# Patient Record
Sex: Male | Born: 1960 | Smoking: Never smoker
Health system: Southern US, Community
[De-identification: ages and names within clinical notes are randomized; demographics above are authoritative.]

## PROBLEM LIST (undated history)

## (undated) DIAGNOSIS — E785 Hyperlipidemia, unspecified: Secondary | ICD-10-CM

## (undated) DIAGNOSIS — R7303 Prediabetes: Secondary | ICD-10-CM

## (undated) DIAGNOSIS — K219 Gastro-esophageal reflux disease without esophagitis: Secondary | ICD-10-CM

## (undated) DIAGNOSIS — I10 Essential (primary) hypertension: Secondary | ICD-10-CM

## (undated) DIAGNOSIS — L409 Psoriasis, unspecified: Secondary | ICD-10-CM

## (undated) DIAGNOSIS — M545 Low back pain, unspecified: Secondary | ICD-10-CM

## (undated) DIAGNOSIS — G4730 Sleep apnea, unspecified: Secondary | ICD-10-CM

## (undated) DIAGNOSIS — L309 Dermatitis, unspecified: Secondary | ICD-10-CM

## (undated) DIAGNOSIS — F329 Major depressive disorder, single episode, unspecified: Secondary | ICD-10-CM

## (undated) DIAGNOSIS — M1990 Unspecified osteoarthritis, unspecified site: Secondary | ICD-10-CM

## (undated) DIAGNOSIS — K635 Polyp of colon: Secondary | ICD-10-CM

## (undated) DIAGNOSIS — C61 Malignant neoplasm of prostate: Secondary | ICD-10-CM

## (undated) DIAGNOSIS — G44009 Cluster headache syndrome, unspecified, not intractable: Secondary | ICD-10-CM

## (undated) DIAGNOSIS — F32A Depression, unspecified: Secondary | ICD-10-CM

## (undated) DIAGNOSIS — T7840XA Allergy, unspecified, initial encounter: Secondary | ICD-10-CM

## (undated) DIAGNOSIS — A159 Respiratory tuberculosis unspecified: Secondary | ICD-10-CM

## (undated) DIAGNOSIS — Z87442 Personal history of urinary calculi: Secondary | ICD-10-CM

## (undated) HISTORY — DX: Unspecified osteoarthritis, unspecified site: M19.90

## (undated) HISTORY — DX: Dermatitis, unspecified: L30.9

## (undated) HISTORY — DX: Allergy, unspecified, initial encounter: T78.40XA

## (undated) HISTORY — DX: Malignant neoplasm of prostate: C61

## (undated) HISTORY — DX: Essential (primary) hypertension: I10

## (undated) HISTORY — DX: Hyperlipidemia, unspecified: E78.5

## (undated) HISTORY — DX: Cluster headache syndrome, unspecified, not intractable: G44.009

## (undated) HISTORY — DX: Sleep apnea, unspecified: G47.30

## (undated) HISTORY — DX: Prediabetes: R73.03

## (undated) HISTORY — DX: Polyp of colon: K63.5

## (undated) HISTORY — PX: APPENDECTOMY: SHX54

## (undated) HISTORY — DX: Psoriasis, unspecified: L40.9

## (undated) HISTORY — DX: Personal history of urinary calculi: Z87.442

## (undated) HISTORY — DX: Respiratory tuberculosis unspecified: A15.9

## (undated) HISTORY — PX: POLYPECTOMY: SHX149

---

## 2003-01-29 ENCOUNTER — Ambulatory Visit (HOSPITAL_COMMUNITY): Admission: RE | Admit: 2003-01-29 | Discharge: 2003-01-29 | Payer: Self-pay | Admitting: Gastroenterology

## 2003-01-29 ENCOUNTER — Encounter (INDEPENDENT_AMBULATORY_CARE_PROVIDER_SITE_OTHER): Payer: Self-pay | Admitting: Specialist

## 2005-11-01 ENCOUNTER — Ambulatory Visit: Payer: Self-pay | Admitting: Cardiology

## 2006-01-03 ENCOUNTER — Ambulatory Visit: Payer: Self-pay | Admitting: Internal Medicine

## 2006-01-11 ENCOUNTER — Encounter: Payer: Self-pay | Admitting: Internal Medicine

## 2006-01-11 ENCOUNTER — Ambulatory Visit (HOSPITAL_BASED_OUTPATIENT_CLINIC_OR_DEPARTMENT_OTHER): Admission: RE | Admit: 2006-01-11 | Discharge: 2006-01-11 | Payer: Self-pay | Admitting: Internal Medicine

## 2006-01-11 ENCOUNTER — Encounter: Payer: Self-pay | Admitting: Cardiology

## 2006-01-15 ENCOUNTER — Ambulatory Visit: Payer: Self-pay | Admitting: Internal Medicine

## 2006-01-26 ENCOUNTER — Ambulatory Visit: Payer: Self-pay | Admitting: Internal Medicine

## 2006-02-23 ENCOUNTER — Ambulatory Visit: Payer: Self-pay | Admitting: Internal Medicine

## 2006-04-06 ENCOUNTER — Ambulatory Visit: Payer: Self-pay | Admitting: Internal Medicine

## 2006-08-16 ENCOUNTER — Ambulatory Visit: Payer: Self-pay | Admitting: Gastroenterology

## 2006-08-29 ENCOUNTER — Ambulatory Visit: Payer: Self-pay | Admitting: Gastroenterology

## 2006-08-29 ENCOUNTER — Encounter (INDEPENDENT_AMBULATORY_CARE_PROVIDER_SITE_OTHER): Payer: Self-pay | Admitting: Specialist

## 2009-06-01 ENCOUNTER — Encounter: Payer: Self-pay | Admitting: Internal Medicine

## 2009-06-01 ENCOUNTER — Encounter: Payer: Self-pay | Admitting: Cardiology

## 2010-01-22 ENCOUNTER — Encounter: Payer: Self-pay | Admitting: Internal Medicine

## 2010-01-25 ENCOUNTER — Encounter: Payer: Self-pay | Admitting: Internal Medicine

## 2010-01-29 ENCOUNTER — Encounter: Payer: Self-pay | Admitting: Internal Medicine

## 2010-01-29 ENCOUNTER — Encounter: Payer: Self-pay | Admitting: Cardiology

## 2010-02-02 ENCOUNTER — Encounter: Payer: Self-pay | Admitting: Internal Medicine

## 2010-02-02 ENCOUNTER — Encounter: Payer: Self-pay | Admitting: Cardiology

## 2010-02-04 ENCOUNTER — Ambulatory Visit: Payer: Self-pay | Admitting: Internal Medicine

## 2010-02-04 DIAGNOSIS — G4733 Obstructive sleep apnea (adult) (pediatric): Secondary | ICD-10-CM | POA: Insufficient documentation

## 2010-02-04 DIAGNOSIS — Z8611 Personal history of tuberculosis: Secondary | ICD-10-CM | POA: Insufficient documentation

## 2010-02-10 ENCOUNTER — Encounter: Payer: Self-pay | Admitting: Cardiology

## 2010-02-10 DIAGNOSIS — J3089 Other allergic rhinitis: Secondary | ICD-10-CM | POA: Insufficient documentation

## 2010-02-10 DIAGNOSIS — I10 Essential (primary) hypertension: Secondary | ICD-10-CM | POA: Insufficient documentation

## 2010-02-10 DIAGNOSIS — E785 Hyperlipidemia, unspecified: Secondary | ICD-10-CM | POA: Insufficient documentation

## 2010-02-10 DIAGNOSIS — J45909 Unspecified asthma, uncomplicated: Secondary | ICD-10-CM | POA: Insufficient documentation

## 2010-02-10 DIAGNOSIS — E1159 Type 2 diabetes mellitus with other circulatory complications: Secondary | ICD-10-CM | POA: Insufficient documentation

## 2010-02-10 DIAGNOSIS — J40 Bronchitis, not specified as acute or chronic: Secondary | ICD-10-CM | POA: Insufficient documentation

## 2010-02-10 DIAGNOSIS — J309 Allergic rhinitis, unspecified: Secondary | ICD-10-CM

## 2010-03-04 ENCOUNTER — Ambulatory Visit: Payer: Self-pay | Admitting: Cardiology

## 2010-03-04 DIAGNOSIS — I2789 Other specified pulmonary heart diseases: Secondary | ICD-10-CM | POA: Insufficient documentation

## 2010-03-10 ENCOUNTER — Encounter: Payer: Self-pay | Admitting: Cardiology

## 2010-03-10 ENCOUNTER — Ambulatory Visit: Payer: Self-pay | Admitting: Cardiology

## 2010-03-10 ENCOUNTER — Ambulatory Visit (HOSPITAL_COMMUNITY): Admission: RE | Admit: 2010-03-10 | Discharge: 2010-03-10 | Payer: Self-pay | Admitting: Cardiology

## 2010-03-10 ENCOUNTER — Ambulatory Visit: Payer: Self-pay

## 2010-03-16 ENCOUNTER — Ambulatory Visit: Payer: Self-pay | Admitting: Cardiology

## 2010-03-16 DIAGNOSIS — R0989 Other specified symptoms and signs involving the circulatory and respiratory systems: Secondary | ICD-10-CM

## 2010-03-16 DIAGNOSIS — R0609 Other forms of dyspnea: Secondary | ICD-10-CM | POA: Insufficient documentation

## 2010-04-01 ENCOUNTER — Ambulatory Visit: Payer: Self-pay | Admitting: Internal Medicine

## 2010-05-31 ENCOUNTER — Telehealth: Payer: Self-pay | Admitting: Internal Medicine

## 2010-06-10 ENCOUNTER — Ambulatory Visit: Payer: Self-pay | Admitting: Internal Medicine

## 2010-06-10 DIAGNOSIS — M549 Dorsalgia, unspecified: Secondary | ICD-10-CM | POA: Insufficient documentation

## 2010-06-10 IMAGING — CR DG CHEST 2V
2 series · 2 of 2 positions shown · non-contrast
Comparison: None.

CLINICAL DATA: Hemoptysis.  History TB.  Back pain.  Asthma.
Hypertension.

CHEST - 2 VIEW

[view not recorded (1 of 2)]
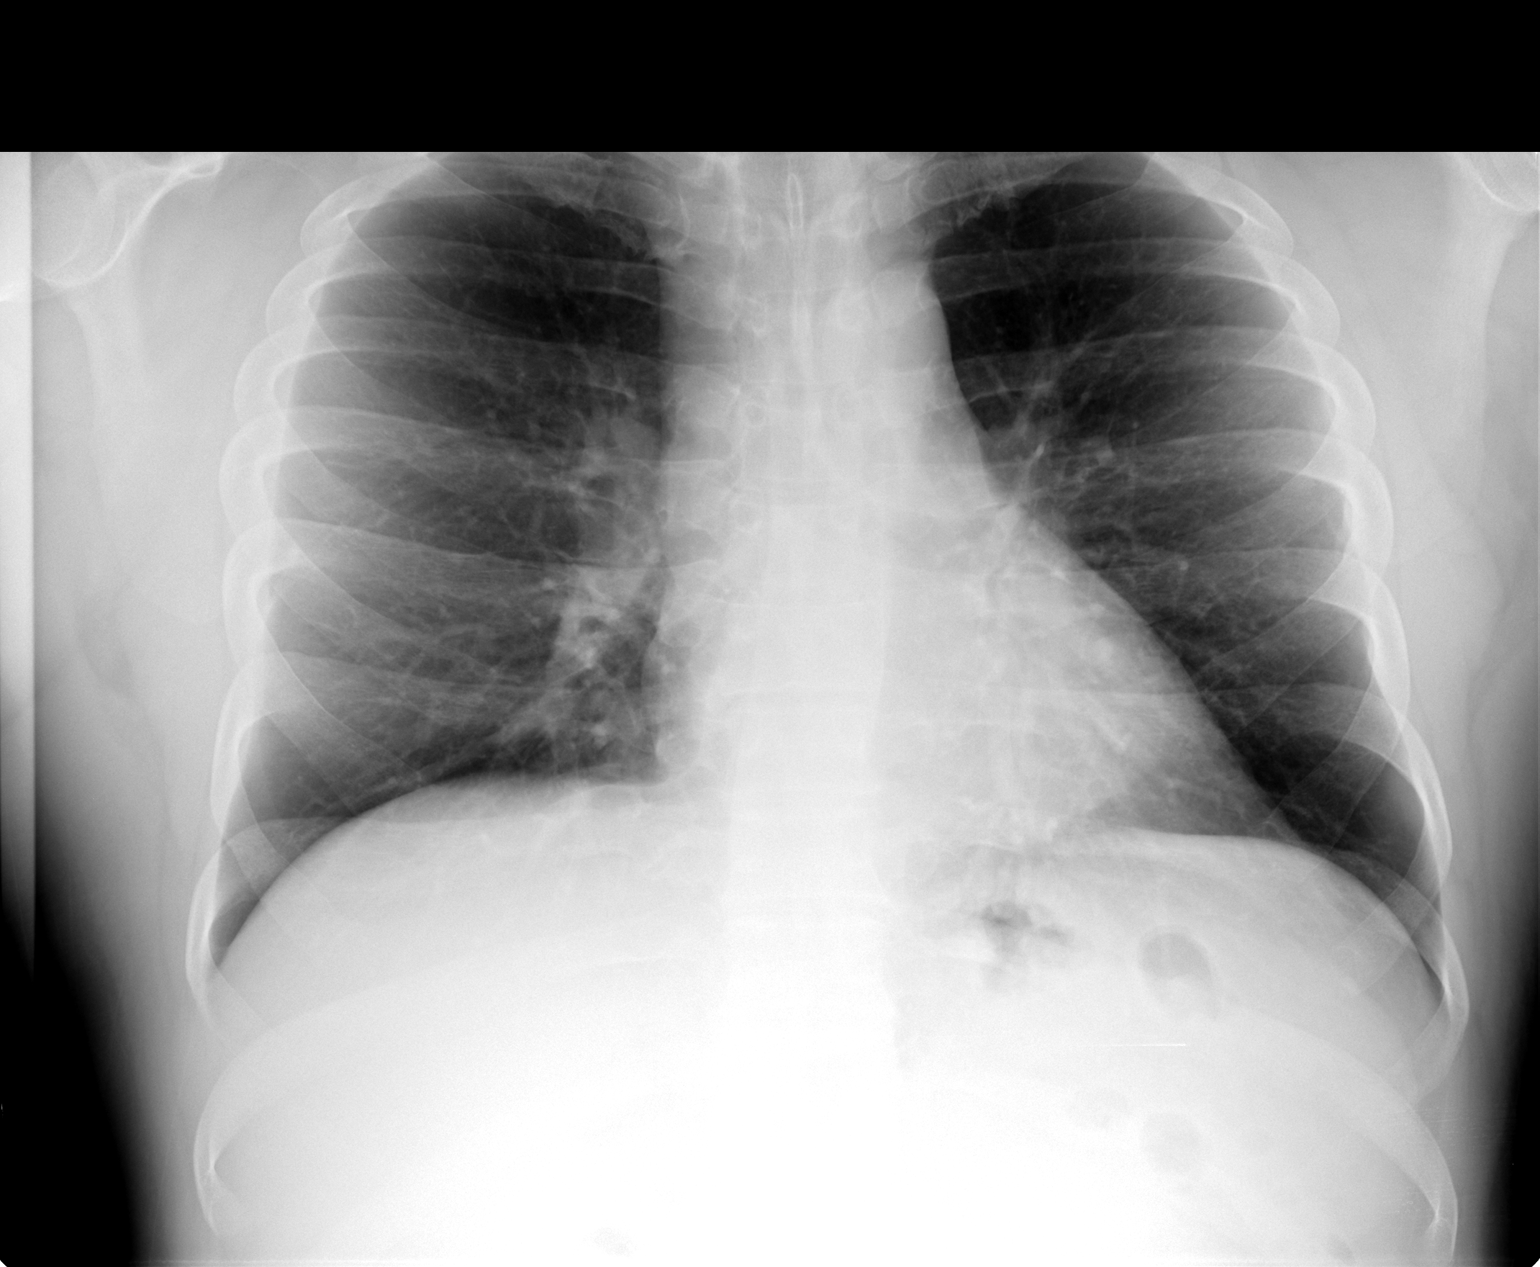

[view not recorded (2 of 2)]
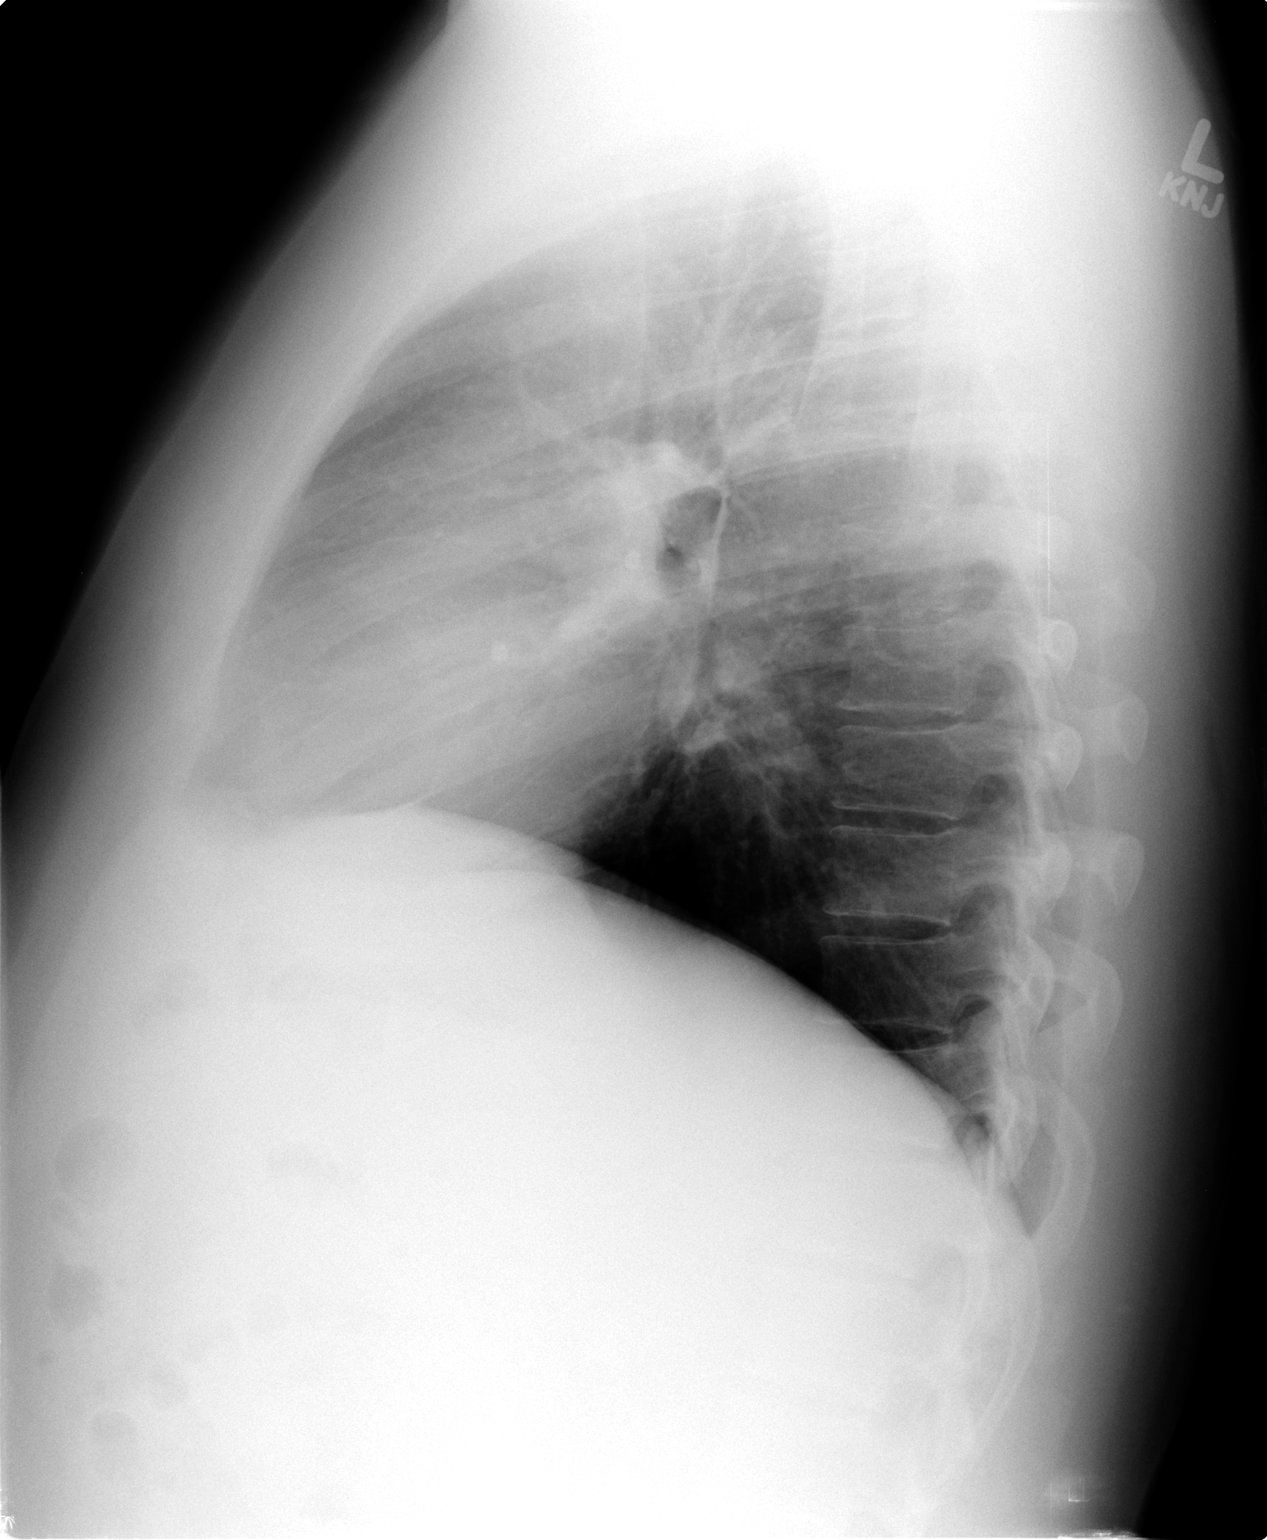

[2 of 2 positions shown; findings below may reference images not displayed]

FINDINGS: Normal cardiomediastinal silhouette.  Lungs clear well
expanded.  No mass.  Bony thorax intact.
IMPRESSION: No active chest disease.

## 2010-06-18 ENCOUNTER — Encounter: Admission: RE | Admit: 2010-06-18 | Discharge: 2010-09-16 | Payer: Self-pay | Admitting: Family Medicine

## 2010-06-18 LAB — CONVERTED CEMR LAB
HCT: 42.5 % (ref 39.0–52.0)
Lymphs Abs: 1.2 10*3/uL (ref 0.7–4.0)
Monocytes Relative: 10.3 % (ref 3.0–12.0)
Neutro Abs: 4.5 10*3/uL (ref 1.4–7.7)
Prothrombin Time: 11.3 s (ref 9.7–11.8)
RBC: 4.92 M/uL (ref 4.22–5.81)
RDW: 13.2 % (ref 11.5–14.6)
WBC: 6.4 10*3/uL (ref 4.5–10.5)
aPTT: 28.5 s (ref 21.7–28.8)

## 2010-10-14 ENCOUNTER — Encounter
Admission: RE | Admit: 2010-10-14 | Discharge: 2010-10-14 | Payer: Self-pay | Source: Home / Self Care | Attending: Family Medicine | Admitting: Family Medicine

## 2010-12-07 ENCOUNTER — Ambulatory Visit: Payer: Self-pay | Admitting: Internal Medicine

## 2011-01-25 NOTE — Letter (Signed)
Summary: Ear Nose & Throat Surgery   Ear Nose & Throat Surgery   Imported By: Roderic Ovens 03/24/2010 12:54:24  _____________________________________________________________________  External Attachment:    Type:   Image     Comment:   External Document

## 2011-01-25 NOTE — Letter (Signed)
Summary: Nocturnal Polysomnogram   Nocturnal Polysomnogram   Imported By: Roderic Ovens 03/24/2010 12:56:22  _____________________________________________________________________  External Attachment:    Type:   Image     Comment:   External Document

## 2011-01-25 NOTE — Assessment & Plan Note (Signed)
Summary: sleep former pt/ mbw okay per Florentina Addison   Primary Provider/Referring Provider:  Pricilla Larsson  CC:  Sleep Consult-former pt;Dr Haroldine Laws.Marland Kitchen  History of Present Illness: February 04, 2010- 48yoM seen for Dr Haroldine Laws concerned about sleep apnea. He had been dx'd with an NPSG 01/11/06, AHI 52.8. He has been using cpap at 14/ [employer], but is considering having sugery instead. During work-up, a CT [facility] 02/02/10 showed a right mid liung calcified granuloma, left costomanubrial junction bone spur projecting as a nodule, 2 smal noncalcified nodules left base and right middle lobe, each about 5 mm. Nodes were normal. A hypodense area was noted in the pancreas. He is treated for sinus disease now on Avelox.  He mentions cough with green since October, unresponsive to antibiotics x 4. Denies night sweats, fever, adenopathy or weight loss. Dr Lucie Leather follows for allergy. 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 Treated with INH x 6 months through [facility] in 1997. Never dx'd with active disease. Bedtime 1030-11PM, latency 10 min, waking 6-7AM. Weight hasn't changed.   Preventive Screening-Counseling & Management  Alcohol-Tobacco     Smoking Status: never  Current Medications (verified): 1)  Crestor 10 Mg Tabs (Rosuvastatin Calcium) .... Take 1 By Mouth Once Daily 2)  Norvasc 5 Mg Tabs (Amlodipine Besylate) .... 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 Patient never smoked.  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  The patient denies shortness of breath with activity, shortness of breath at rest, non-productive cough, coughing up blood, irregular heartbeats, acid heartburn, indigestion, loss of appetite, weight change, abdominal pain, difficulty swallowing, tooth/dental problems, headaches, itching, ear ache, anxiety, depression, hand/feet swelling, joint stiffness or pain, rash, change in color of mucus, and fever.    Vital Signs:  Patient profile:   50 year old male Height:      66 inches Weight:      195.13 pounds BMI:     31.61 O2 Sat:      98 % on Room air Pulse rate:   72 / minute BP sitting:   126 / 84  (left arm) Cuff size:   regular  Vitals Entered By: Reynaldo Minium CMA (February 04, 2010 11:37 AM)  O2 Flow:  Room air  Physical Exam  Additional Exam:  General: A/Ox3; pleasant and cooperative, NAD, overweight SKIN: no rash, lesions NODES: no lymphadenopathy HEENT: [place]/AT, EOM- WNL, Conjuctivae- clear, PERRLA, TM-WNL, Nose- clear, Throat- clear and wnl, [place]  III-IV NECK: Supple w/ fair ROM, JVD- none, normal carotid impulses w/o bruits Thyroid- normal to palpation CHEST: Clear to P&A, no cough HEART: RRR, no m/g/r heard ABDOMEN: Soft and nl; nml bowel sounds; no organomegaly or masses noted BJY:NWGN, nl pulses, no edema  NEURO: Grossly intact to observation     Impression & Recommendations:  Problem # 1:  OBSTRUCTIVE SLEEP APNEA (ICD-327.23) Obstructive sleep apnea has been fairly well controlled on cpap at 14, but he finds the cpap bothersome and is  ocnsidering surgery as an option. He finds travelling with cpap to be cumbersome.  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 No hx of active or culture positive disease, but has  caclfied granulomas and noncalcified nodules of uncertain status on CT, persistent productive cough since October. No response so far to antibiotics and no night sweats, nodes or weight loss. He will finish current Avelox. I am asking for sputm for AFB to be sure. We will recheck the noncalcified nodules for stability in about 6 monthswith noncontrast CT.  Problem # 3:  BRONCHITIS (ICD-490) He will finish Avelox. Sputumn for AFB as above. We will try adding a Qvar maintenance inhaler. I think he can go ahead with surgery now if that is the decision. His updated medication list for this problem includes:    Avelox Abc Pack 400 Mg Tabs (Moxifloxacin hcl)  Medications Added to Medication List This Visit: 1)  Crestor 10 Mg Tabs (Rosuvastatin calcium) .... Take 1 by mouth once daily 2)  Norvasc 5 Mg Tabs (Amlodipine besylate) .... Take 1 by mouth once daily 3)  Avelox Abc Pack 400 Mg Tabs (Moxifloxacin hcl)  Other Orders: Consultation Level IV (82956) T-Culture & Smear AFB ([id number])  Patient Instructions: 1)  Please schedule a follow-up appointment in 6 months. 2)  We will considder scheduling a repeat CT scan when you return, as recommended by the radiologist. 3)  Lab 4)  OK to have surgery for sleep apnea with Dr Haroldine Laws if you decide that is what you want to do.

## 2011-01-25 NOTE — Progress Notes (Signed)
Summary: pulm appt  Phone Note Call from Patient   Caller: Patient Call For: Toula Miyasaki Summary of Call: pt needs earlier appt than july "to have his lungs checked". pt was a little difficult to understand but i asked if this was re: sleep and he stated that he had "some kind of "oma".  does dr Magon Croson see pt for this as well as sleep? pt cell [phone] note: i scheduled him for consult in july but pt requests to be seen soon.  Initial call taken by: Tivis Ringer, CNA,  May 31, 2010 1:05 PM  Follow-up for Phone Call        [facility] Carron Curie CMA  May 31, 2010 2:30 PM  According to last OV pt staets he also discussed with CY about some spots on his lungs and that he needed a CT scan. According to notes from Feb OV pt was to f/u in 6 months and discuss a CT at that time. Pt had thought this was supposed to be in June. He then c/o having pain onthe right side of his back when he breathes and wanted to schedule a sooner appt with CY to discuss this. He states he has had the pain for a long time so 1st available appt is ok with him. I set pt to see CY on 06-10-10 at 10:15. pt aware of appt.Carron Curie CMA  June 01, 2010 9:35 AM

## 2011-01-25 NOTE — Assessment & Plan Note (Signed)
Summary: Rickey Cruz/ cardiac evaluation, pt on cpap/ pt has blue cross state ...  Medications Added FLUOCINONIDE 0.05 % OINT (FLUOCINONIDE) apply to affected area as needed HYDROCHLOROTHIAZIDE 12.5 MG CAPS (HYDROCHLOROTHIAZIDE) take one capsule daily FLUTICASONE PROPIONATE 0.05 % CREA (FLUTICASONE PROPIONATE) apply to affected area as needed DESONIDE 0.05 % CREA (DESONIDE) apply to affected area two times a day CLOBETASOL PROPIONATE 0.05 % CREA (CLOBETASOL PROPIONATE) as needed      Allergies Added: NKDA  Referring Provider:  Dr. Haroldine Laws Primary Provider:  [facility]  CC:  new patient/  pt on cpap/ cardiac evaluation.  History of Present Illness: 50 yo referred for cardiac evaluation prior to possible ENT surgery for relief of OSA.  Patient has significant OSA and has been noted to desaturate to as low as 59% on sleep study.  He is using CPAP.  He is considering ENT surgery for OSA relief.  He is still sleepy during the day despite CPAP use.  He denies exertional dyspnea or chest pain.  He gets occasional sensation like pinpricks lasting for seconds in the chest that can come on at any time.  He can climb steps without trouble and can walk as far as he wants on flat ground.  Nonsmoker.  He has controlled HTN (BP 121/78 today) and treated hyperlipidemia.   ECG: NSR, normal  Current Medications (verified): 1)  Crestor 10 Mg Tabs (Rosuvastatin Calcium) .... Take 1 By Mouth Once Daily 2)  Norvasc 5 Mg Tabs (Amlodipine Besylate) .... Take 1 By Mouth Once Daily 3)  Fluocinonide 0.05 % Oint (Fluocinonide) .... Apply To Affected Area As Needed 4)  Hydrochlorothiazide 12.5 Mg Caps (Hydrochlorothiazide) .... Take One Capsule Daily 5)  Fluticasone Propionate 0.05 % Crea (Fluticasone Propionate) .... Apply To Affected Area As Needed 6)  Desonide 0.05 % Crea (Desonide) .... Apply To Affected Area Two Times A Day 7)  Clobetasol Propionate 0.05 % Crea (Clobetasol Propionate) .... As Needed  Allergies  (verified): No Known Drug Allergies  Past History:  Past Medical History: 1. 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 Allergic Rhinitis- Dr Lucie Leather 3. Asthma 4. Obstructive sleep apnea- 01/11/06- NPSG 52.8, uses CPAP 5. Hyperlipidemia 6. Hypertension 7. Psoriatic arthritis- Dr Kellie Simmering  Family History: Sister-asthma No CAD  Social History: Married with children. Patient never smoked.  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   Vital Signs:  Patient profile:   50 year old male Height:      66 inches Weight:      189 pounds BMI:     30.62 Pulse rate:   63 / minute Pulse rhythm:   regular BP sitting:   121 / 78  (left arm) Cuff size:   large  Vitals Entered By: Judithe Modest CMA (March 04, 2010 3:58 PM)  Physical Exam  General:  Well developed, well nourished, in no acute distress. Mildly obese.  Head:  normocephalic and atraumatic Nose:  no deformity, discharge, inflammation, or lesions Mouth:  Teeth, gums and palate normal. Oral mucosa normal. Neck:  Neck supple, no JVD. No masses, thyromegaly or abnormal cervical nodes. Lungs:  Clear bilaterally to auscultation and percussion. Heart:  Non-displaced PMI, chest non-tender; regular rate and rhythm, S1, S2 without murmurs, rubs or gallops. Carotid upstroke normal, no bruit.  Pedals normal pulses. No edema, no varicosities. Abdomen:  Bowel sounds positive;  abdomen soft and non-tender without masses, organomegaly, or hernias noted. No hepatosplenomegaly. Msk:  Back normal, normal gait. Muscle strength and tone normal. Extremities:  No clubbing or cyanosis. Neurologic:  Alert and oriented x 3. Skin:  Intact without lesions or rashes. Psych:  Normal affect.   Impression & Recommendations:  Problem # 1:  OBSTRUCTIVE SLEEP APNEA (ICD-327.23) Patient has severe OSA and is using CPAP.  He is  considering an ENT surgery for OSA relief.  He has good exercise tolerance with no exertional shortness of breath or chest pain so I do not think that a stress test is indicated prior to any surgery.  Given his significant OSA, I do think that it would be reasonable to obtain an echocardiogram to assess for any evidence for pulmonary hypertension/cor pulmonale.   Problem # 2:  HYPERTENSION (ICD-401.9) BP appears well-controlled on current meds.   Other Orders: EKG w/ Interpretation (93000) Echocardiogram (Echo)  Patient Instructions: 1)  Your physician recommends that you schedule a follow-up appointment in: 2 weeks with Dr. Shirlee Latch. 2)  Your physician recommends that you continue on your current medications as directed. Please refer to the Current Medication list given to you today. 3)  Your physician has requested that you have an echocardiogram.  Echocardiography is a painless test that uses sound waves to create images of your heart. It provides your doctor with information about the size and shape of your heart and how well your heart's chambers and valves are working.  This procedure takes approximately one hour. There are no restrictions for this procedure.

## 2011-01-25 NOTE — Letter (Signed)
Summary: Keturah Barre MD  Keturah Barre MD   Imported By: Lester [place] 02/16/2010 10:28:54  _____________________________________________________________________  External Attachment:    Type:   Image     Comment:   External Document

## 2011-01-25 NOTE — Assessment & Plan Note (Signed)
Summary: lung pain//Rickey Cruz   Copy to:  Dr. Haroldine Laws Primary Provider/Referring Provider:  Derenda Mis  CC:  Pain in upper back; nasal congestion; no SOB and wheezing; ?cough up blood last night..  History of Present Illness: History of Present Illness: February 04, 2010- 50yoM seen for Dr Haroldine Laws concerned about sleep apnea. He had been dx'd with an NPSG 01/11/06, AHI 52.8. He has been using cpap at 14/ [employer], but is considering having sugery instead. During work-up, a CT [facility] 02/02/10 showed a right mid liung calcified granuloma, left costomanubrial junction bone spur projecting as a nodule, 2 smal noncalcified nodules left base and right middle lobe, each about 5 mm. Nodes were normal. A hypodense area was noted in the pancreas. He is treated for sinus disease now on Avelox.  He mentions cough with green since October, unresponsive to antibiotics x 4. Denies night sweats, fever, adenopathy or weight loss. Dr Lucie Leather follows for allergy. 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 Treated with INH x 6 months through [facility] in [facility]. Never dx'd with active disease. Bedtime 1030-11PM, latency 10 min, waking 6-7AM. Weight hasn't changed.  June 10, 2010- OSA, allergic rhinitis (DR Kozl,ow), Asthma, Psoriasis. Saw Dr Shirlee Latch preop for anticipated OSA surgery by Dr Haroldine Laws. Echocardiogram showed no PHTN. Has been hurting some with dull pain/ ache across mid thoracic spine, increased by twisting. CT chest with contrast in Feb, 2011- stable old granulomas, mediastinal fat pad. Nasal spray usually controls his postnasal drip. New problem: coughed blood yesterday. Was driving car, felt something in throat, coughed and refluxed. Had eaten red peach. saw some red in the mucus. Twinge anterior chest pains. Denies fever, nodes, weight loss.    Asthma History    Initial Asthma Severity Rating:    Age range: 12+ years    Symptoms: 0-2 days/week    Nighttime Awakenings: 0-2/month  Interferes w/ normal activity: no limitations    SABA use (not for EIB): 0-2 days/week    Asthma Severity Assessment: Intermittent   Preventive Screening-Counseling & Management  Alcohol-Tobacco     Smoking Status: never  Current Medications (verified): 1)  Crestor 10 Mg Tabs (Rosuvastatin Calcium) .... Take 1 By Mouth Once Daily 2)  Norvasc 5 Mg Tabs (Amlodipine Besylate) .... Take 1 By Mouth Once Daily 3)  Fluocinonide 0.05 % Oint (Fluocinonide) .... Apply To Affected Area As Needed 4)  Hydrochlorothiazide 12.5 Mg Caps (Hydrochlorothiazide) .... Take One Capsule Daily 5)  Fluticasone Propionate 0.05 % Crea (Fluticasone Propionate) .... Apply To Affected Area As Needed 6)  Desonide 0.05 % Crea (Desonide) .... Apply To Affected Area Two Times A Day 7)  Clobetasol Propionate 0.05 % Crea (Clobetasol Propionate) .... As Needed 8)  Aspirin .... Daily  Allergies (verified): No Known Drug Allergies  Past History:  Past Medical History: Last updated: 03/16/2010 1. 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 Allergic Rhinitis- Dr Lucie Leather 3. Asthma 4. Obstructive sleep apnea- 01/11/06- NPSG 52.8, uses CPAP 5. Hyperlipidemia 6. Hypertension 7. Psoriatic arthritis- Dr Kellie Simmering 8. Echo (3/11): EF 55-60%, normal wall motion, grade I diastolic dysfunction, PA systolic pressure 25 mmHg, RV normal size and systolic function.   Past Surgical History: Last updated: 02/04/2010 Appendectomy  Family History: Last updated: 03/04/2010 Sister-asthma No CAD  Social History: Last updated: 03/04/2010 Married with children. Patient never smoked.  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  The patient denies shortness of breath with activity, shortness of breath at rest, productive cough, non-productive cough,  chest pain, irregular heartbeats, acid heartburn, indigestion, loss of appetite, weight change, abdominal pain, difficulty swallowing, sore throat, tooth/dental problems, headaches, nasal congestion/difficulty breathing through nose, and sneezing.    Vital Signs:  Patient profile:   50 year old male Height:      66 inches Weight:      200 pounds BMI:     32.40 O2 Sat:      98 % on Room air Pulse rate:   77 / minute BP sitting:   116 / 72  (left arm) Cuff size:   regular  Vitals Entered By: Reynaldo Minium CMA (June 10, 2010 10:22 AM)  O2 Flow:  Room air CC: Pain in upper back; nasal congestion; no SOB and wheezing; ?cough up blood last night.   Physical Exam  Additional Exam:  General: A/Ox3; pleasant and cooperative, NAD, overweight SKIN: no rash, lesions NODES: no lymphadenopathy HEENT: [place]/AT, EOM- WNL, Conjuctivae- clear, PERRLA, TM-WNL, Nose- clear, Throat- clear and wnl, Mallampati  III-IV NECK: Supple w/ fair ROM, JVD- none, normal carotid impulses w/o bruits Thyroid- normal to palpation CHEST: Clear to P&A, no cough HEART: RRR, no m/g/r heard ABDOMEN: Soft and nl; nml bowel sounds; no organomegaly or masses noted JYN:WGNF, nl pulses, no edema  NEURO: Grossly intact to observation     Impression & Recommendations:  Problem # 1:  OBSTRUCTIVE SLEEP APNEA (ICD-327.23) He continues CPAP with good compliance and control, but discussing surgery with Dr Haroldine Laws.  Problem # 2:  HEMOPTYSIS UNSPECIFIED (ICD-786.30)  He isn't sure if what came up was from stomach or lung, and isn't sure whether the red was actually red peach pulp vs blood. No prior GI or airway bleed. Takes aspirin if needed. I favor the red material being peach pulp in this nonsmoker.  Problem # 3:  BACK PAIN (ICD-724.5)  Pain symmetric around mid thoracic spine sounds more like a mechanical spine issue but we will compare CXR today with report from CT in February. His primary may need to f/u on this if it is  DGD.  Other Orders: Est. Patient Level IV (99214) T-2 View CXR (71020TC) TLB-CBC Platelet - w/Differential (85025-CBCD) TLB-PT (Protime) (85610-PTP) TLB-PTT (85730-PTTL)  Patient Instructions: 1)  Please schedule a follow-up appointment in 6 months. 2)  Lab 3)  A chest x-ray has been recommended.  Your imaging study may require preauthorization.  4)  Avoid aspirin this week. Take tylenol and use heating pad as needed for your back. If you keep hurting, then see your primary doctor.

## 2011-01-25 NOTE — Assessment & Plan Note (Signed)
Summary: per check out/sf  Medications Added * ASPIRIN daily      Allergies Added: NKDA  Visit Type:  Follow-up Referring Provider:  Dr. Haroldine Laws Primary Provider:  [facility]   History of Present Illness: 50 yo referred for cardiac evaluation prior to possible ENT surgery for relief of OSA.  Patient has significant OSA and has been noted to desaturate to as low as 59% on sleep study.  He is using CPAP.  He is considering ENT surgery for OSA relief.  He is still sleepy during the day despite CPAP use.  He denies exertional dyspnea or chest pain.  He gets occasional sensation like pinpricks lasting for seconds in the chest that can come on at any time.  He can climb steps without trouble and can walk as far as he wants on flat ground.  Nonsmoker.  He has controlled HTN and treated hyperlipidemia.   Given the long-standing severe OSA, I had him do an echocardiogram to assess for pulmonary hypertension/RV dysfunction.  Echo showed normal pulmonary artery pressure and normal RV. LV systolic function was normal.   Current Medications (verified): 1)  Crestor 10 Mg Tabs (Rosuvastatin Calcium) .... Take 1 By Mouth Once Daily 2)  Norvasc 5 Mg Tabs (Amlodipine Besylate) .... Take 1 By Mouth Once Daily 3)  Fluocinonide 0.05 % Oint (Fluocinonide) .... Apply To Affected Area As Needed 4)  Hydrochlorothiazide 12.5 Mg Caps (Hydrochlorothiazide) .... Take One Capsule Daily 5)  Fluticasone Propionate 0.05 % Crea (Fluticasone Propionate) .... Apply To Affected Area As Needed 6)  Desonide 0.05 % Crea (Desonide) .... Apply To Affected Area Two Times A Day 7)  Clobetasol Propionate 0.05 % Crea (Clobetasol Propionate) .... As Needed 8)  Aspirin .... Daily  Allergies (verified): No Known Drug Allergies  Past History:  Past Medical History: 1. 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 Allergic Rhinitis- Dr Lucie Leather 3. Asthma 4. Obstructive sleep apnea- 01/11/06- NPSG 52.8, uses  CPAP 5. Hyperlipidemia 6. Hypertension 7. Psoriatic arthritis- Dr Kellie Simmering 8. Echo (3/11): EF 55-60%, normal wall motion, grade I diastolic dysfunction, PA systolic pressure 25 mmHg, RV normal size and systolic function.   Family History: Reviewed history from 03/04/2010 and no changes required. Sister-asthma No CAD  Social History: Reviewed history from 03/04/2010 and no changes required. Married with children. Patient never smoked.  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 Mildly obese.  Neck:  Neck supple, no JVD. No masses, thyromegaly or abnormal cervical nodes. Lungs:  Clear bilaterally to auscultation and percussion. Heart:  Non-displaced PMI, chest non-tender; regular rate and rhythm, S1, S2 without murmurs, rubs or gallops. Carotid upstroke normal, no bruit.  Pedals normal pulses. No edema, no varicosities. Abdomen:  Bowel sounds positive; abdomen soft and non-tender without masses, organomegaly, or hernias noted. No hepatosplenomegaly. Extremities:  No clubbing or cyanosis. Neurologic:  Alert and oriented x 3. Psych:  Normal affect.   Impression & Recommendations:  Problem # 1:  OBSTRUCTIVE SLEEP APNEA (ICD-327.23) Echo showed preserved RV systolic  function and no pulmonary hypertension.  He is asymptomatic from a cardiopulmonary standpoint.  He should be of reasonable risk to undergo surgery for OSA with no further cardiac testing.    I will set the patient up with a PCP at his request.  He would like to go to the [employer] office.   Other Orders: Primary Care Referral (Primary)  Patient Instructions: 1)  You have  been referred to primary care. 2)  Your physician recommends that you schedule a follow-up appointment as needed with Dr Shirlee Latch.

## 2011-05-13 NOTE — Op Note (Signed)
   NAME:  Rickey Cruz, Rickey Cruz                            ACCOUNT NO.:  [id number]   MEDICAL RECORD NO.:  [id number]                   PATIENT TYPE:  AMB   LOCATION:  ENDO                                 FACILITY:  [facility]   PHYSICIAN:  John C. Madilyn Fireman, M.D.                 DATE OF BIRTH:  03-22-1961   DATE OF PROCEDURE:  01/29/2003  DATE OF DISCHARGE:                                 OPERATIVE REPORT   PROCEDURE:  Esophagogastroduodenoscopy with biopsy.   INDICATION FOR PROCEDURE:  Epigastric abdominal pain, bloating, diarrhea,  and rectal bleeding.  The patient is also scheduled to undergo colonoscopy  in conjunction with this procedure.   DESCRIPTION OF PROCEDURE:  The patient was placed in the left lateral  decubitus position and placed on the pulse monitor with continuous low-flow  oxygen delivered by nasal cannula.  He was sedated with 50 mcg IV fentanyl  and 5 mg IV Versed.  The Olympus video endoscope was advanced under direct  vision into the oropharynx and esophagus.  The esophagus was straight and of  normal caliber with the squamocolumnar line at 38 cm.  There was no visible  hiatal hernia, ring, stricture, or other abnormality of the GE junction or  distal esophagus.  The stomach was entered, and a small amount of liquid  secretions were suctioned from the fundus.  Retroflexed view of the cardia  was unremarkable.  The fundus and body appeared normal.  The antrum was  essentially normal with slight patchy erythema in the prepyloric area with  no focal erosions or ulcers. The pylorus was nondeformed and easily allowed  passage of the endoscope tip into the duodenum.  Both the bulb and second  portion were well-inspected and appeared to be within normal limits.  The  scope was advanced as far as possible down the distal duodenum, and biopsies  were taken to rule out celiac disease.  The scope was then withdrawn back  into the stomach and a CLOtest obtained. The scope was then removed,  and the  patient returned to the recovery room in stable condition.  He tolerated the  procedure well, and there were no immediate complications.   IMPRESSION:  Minimal gastritis, otherwise normal endoscopy.   PLAN:  Await CLOtest and duodenal biopsies and will proceed with colonoscopy  due to rectal bleeding and diarrhea.                                               John C. Madilyn Fireman, M.D.    JCH/MEDQ  D:  01/29/2003  T:  01/29/2003  Job:  [id number]   cc:   Gabriel Earing, M.D.  [address]  [place]  [place] [address]  Fax: [phone]

## 2011-05-13 NOTE — Procedures (Signed)
NAME:  Rickey Cruz, Rickey Cruz                  ACCOUNT NO.:  [id number]   MEDICAL RECORD NO.:  [id number]          PATIENT TYPE:  OUT   LOCATION:  SLEEP CENTER                 FACILITY:  [facility]   PHYSICIAN:  Clinton D. Maple Hudson, M.D. DATE OF BIRTH:  1961-01-22   DATE OF STUDY:  01/11/2006                              NOCTURNAL POLYSOMNOGRAM   REFERRING PHYSICIAN:  Dr. Jetty Duhamel.   INDICATION FOR STUDY:  Hypersomnia with sleep apnea. Epworth sleepiness  score 18/24, BMI 29. Weight 185 pounds.   HOME MEDICATIONS:  Lisinopril, Crestor, Sonata.   SLEEP ARCHITECTURE:  Total sleep time 339 minutes with sleep efficiency 83%.  Stage 1 was 12%, stage 2 72%, stages 3 and 4 were absent, REM 16% of total  sleep time. Sleep latency 9 minutes, REM latency 134 minutes, awake after  sleep onset 61 minutes, arousal index 52. The patient took Sonata 10 mg at  10:21 p.m.   RESPIRATORY DATA:  Split study protocol. Apnea/hypopnea index (AHI, RDI)  52.8 obstructive events per hour indicating moderately severe obstructive  sleep apnea/hypopnea syndrome. This included 67 obstructive apneas, one  central apnea and 68 hypopneas before CPAP. Events were not positional. REM  AHI was absent. CPAP was titrated successfully to 14 CWP, AHI 0 per hour. A  large ResMed Ultra Mirage full face mask was used with heated humidifier.   OXYGEN DATA:  Moderately loud snoring with oxygen desaturation to a nadir of  54% before CPAP. After CPAP control, saturation held 95-98% on room air.   CARDIAC DATA:  Sinus rhythm with sinus arrhythmia.   MOVEMENT/PARASOMNIA:  Leg jerks were noted but few were associated with  arousal or awakening.   IMPRESSION/RECOMMENDATION:  1.  Severe obstructive sleep apnea/hypopnea syndrome, AHI 52.8 per hour with      nonpositional events, moderate to loud snoring and oxygen desaturation      to 54%.  2.  Successful CPAP titration to 14 CWP, AHI 0 per hour. A large ResMed      Ultra Mirage full face  mask was used with a heated humidifier.      Clinton D. Maple Hudson, M.D.  Diplomate, Biomedical engineer of Sleep Medicine  Electronically Signed     CDY/MEDQ  D:  01/15/2006 12:28:08  T:  01/16/2006 17:02:37  Job:  [id number]

## 2011-09-07 ENCOUNTER — Encounter: Payer: Self-pay | Admitting: Gastroenterology

## 2011-09-30 ENCOUNTER — Telehealth: Payer: Self-pay | Admitting: Internal Medicine

## 2011-09-30 ENCOUNTER — Encounter: Payer: Self-pay | Admitting: Gastroenterology

## 2011-09-30 NOTE — Telephone Encounter (Signed)
[email or web address]  

## 2011-10-03 NOTE — Telephone Encounter (Signed)
[email or web address]  

## 2011-10-04 NOTE — Telephone Encounter (Signed)
[email or web address]  

## 2011-10-05 NOTE — Telephone Encounter (Signed)
Called, spoke with pt.  He sees Dr. Maple Hudson for pulmonary and sleep.  He would like to switch to another doctor but does not have a preference at this time on who it is.  Advised of office protocol on switching drs within the office.  He is ok with this and aware we will call him back.  Dr. Maple Hudson, pls advise if you are ok with this?  Thanks!

## 2011-10-05 NOTE — Telephone Encounter (Signed)
Of course- ok to switch to any of the sleep docs who has space. I last saw him in June, 2011

## 2011-10-06 NOTE — Telephone Encounter (Signed)
Spoke with pt. I advised that per CDY okay to switch. I offered new consult with VS for 10/31/11 and asked which issue he wanted to address first, sleep or pulmonary. He states needs both issues addressed in one consult visit. I advised that he would need two separate consults, per Mindy VS only addressed one issue at a time. He then stated that he will just keep his ov with CDY and call for sooner f/u in the meantime if needed.

## 2011-10-17 ENCOUNTER — Ambulatory Visit (AMBULATORY_SURGERY_CENTER): Payer: BC Managed Care – PPO | Admitting: *Deleted

## 2011-10-17 ENCOUNTER — Encounter: Payer: Self-pay | Admitting: Gastroenterology

## 2011-10-17 DIAGNOSIS — Z86010 Personal history of colonic polyps: Secondary | ICD-10-CM

## 2011-10-17 DIAGNOSIS — Z1211 Encounter for screening for malignant neoplasm of colon: Secondary | ICD-10-CM

## 2011-10-17 MED ORDER — PEG-KCL-NACL-NASULF-NA ASC-C 100 G PO SOLR: 1.0000 | Freq: Once | ORAL | Status: DC

## 2011-10-27 DIAGNOSIS — K635 Polyp of colon: Secondary | ICD-10-CM

## 2011-10-27 HISTORY — PX: COLONOSCOPY: SHX174

## 2011-10-27 HISTORY — DX: Polyp of colon: K63.5

## 2011-10-31 ENCOUNTER — Encounter: Payer: Self-pay | Admitting: Gastroenterology

## 2011-10-31 ENCOUNTER — Ambulatory Visit (AMBULATORY_SURGERY_CENTER): Payer: BC Managed Care – PPO | Admitting: Gastroenterology

## 2011-10-31 DIAGNOSIS — D126 Benign neoplasm of colon, unspecified: Secondary | ICD-10-CM

## 2011-10-31 DIAGNOSIS — Z86010 Personal history of colonic polyps: Secondary | ICD-10-CM

## 2011-10-31 DIAGNOSIS — Z1211 Encounter for screening for malignant neoplasm of colon: Secondary | ICD-10-CM

## 2011-10-31 MED ORDER — SODIUM CHLORIDE 0.9 % IV SOLN: 500.0000 mL | INTRAVENOUS | Status: DC

## 2011-10-31 NOTE — Patient Instructions (Signed)
Green and blue discharge instructions reviewed with patient and care partner.  Impressions/recommendations:  Polyp (handout given)  Repeat colonoscopy in 5 years.  Resume medications as you were taking them prior to your procedure.

## 2011-10-31 NOTE — Progress Notes (Signed)
Pt had cramping as the scope was advanced.  Medications were given under Dr. Christella Hartigan orders.  Once the scope was being withdrawn, the pt relaxed and rested comfortably with his eyes closed. maw

## 2011-11-01 ENCOUNTER — Telehealth: Payer: Self-pay | Admitting: *Deleted

## 2011-11-01 NOTE — Telephone Encounter (Signed)
Message left to call if needed

## 2011-11-03 ENCOUNTER — Encounter: Payer: Self-pay | Admitting: Internal Medicine

## 2011-11-03 ENCOUNTER — Ambulatory Visit (INDEPENDENT_AMBULATORY_CARE_PROVIDER_SITE_OTHER): Payer: BC Managed Care – PPO | Admitting: Internal Medicine

## 2011-11-03 VITALS — BP 134/88 | HR 59 | Ht 66.0 in | Wt 199.0 lb

## 2011-11-03 DIAGNOSIS — Z8611 Personal history of tuberculosis: Secondary | ICD-10-CM

## 2011-11-03 DIAGNOSIS — J309 Allergic rhinitis, unspecified: Secondary | ICD-10-CM

## 2011-11-03 DIAGNOSIS — J984 Other disorders of lung: Secondary | ICD-10-CM

## 2011-11-03 DIAGNOSIS — G4733 Obstructive sleep apnea (adult) (pediatric): Secondary | ICD-10-CM

## 2011-11-03 DIAGNOSIS — R911 Solitary pulmonary nodule: Secondary | ICD-10-CM

## 2011-11-03 NOTE — Progress Notes (Signed)
11/03/11 - 50 yoM never smoker followed for OSA, allergic rhinitis (Dr Lucie Leather), Psoriasis LOV-06/10/2010 He declines flu vaccine. He continues to use CPAP 14/ [employer] all night every night but sometimes snores through the mask. He decided not to have palatoplasty surgery with Dr. Haroldine Laws. He has history of small calcified and noncalcified lung nodules on CT scan 02/02/2010. He is interested in followup of this. 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 He comes from [place]. He denies change in shortness of breath with exertion and goes dancing with no chest pain. Notices occasional nonexertional substernal pressure discomfort which is relieved by stretching and not related to reflux.   ROS-see HPI Constitutional:   No-   weight loss, night sweats, fevers, chills, fatigue, lassitude. HEENT:   No-  headaches, difficulty swallowing, tooth/dental problems, sore throat,       No-  sneezing, itching, ear ache,  + nasal congestion, post nasal drip,  CV:  +atypical chest pain,No- orthopnea, PND, swelling in lower extremities, anasarca,                                  dizziness, palpitations Resp: No-   shortness of breath with exertion or at rest.              No-   productive cough,  No non-productive cough,  No- coughing up of blood.              No-   change in color of mucus.  No- wheezing.   Skin: No-   rash or lesions. GI:  No-   heartburn, indigestion, abdominal pain, nausea, vomiting, diarrhea,                 change in bowel habits, loss of appetite GU: No-   dysuria, change in color of urine, no urgency or frequency.  No- flank pain. MS:  No-   joint pain or swelling.  No- decreased range of motion.  No- back pain. Neuro-     nothing unusual Psych:  No- change in mood or affect. No depression or anxiety.  No memory loss.  OBJ General- Alert, Oriented, Affect-appropriate, Distress- none acute; overweight Skin- Acne vs psoriatic lesions on face,  excoriation- none Lymphadenopathy- none Head- atraumatic            Eyes- Gross vision intact, PERRLA, conjunctivae clear secretions            Ears- Hearing, canals-normal            Nose- Clear, no-Septal dev, mucus, polyps, erosion, perforation             Throat- Mallampati III , mucosa clear , drainage- none, tonsils- atrophic. Frequent throat clearing Neck- flexible , trachea midline, no stridor , thyroid nl, carotid no bruit Chest - symmetrical excursion , unlabored           Heart/CV- RRR , no murmur , no gallop  , no rub, nl s1 s2                           - JVD- none , edema- none, stasis changes- none, varices- none           Lung- clear to P&A, wheeze- none, cough- none , dullness-none, rub- none  Chest wall-  Abd- tender-no, distended-no, bowel sounds-present, HSM- no Br/ Gen/ Rectal- Not done, not indicated Extrem- cyanosis- none, clubbing, none, atrophy- none, strength- nl Neuro- grossly intact to observation

## 2011-11-03 NOTE — Patient Instructions (Signed)
Order- CT chest noncontrast  Dx lung nodules     To be done at [facility] to compare with priors  Order- [employer] autotitrate CPAP 5-18 cwp   For pressure check  X 1 week

## 2011-11-06 NOTE — Assessment & Plan Note (Signed)
Increased rhinitis and nasal congestion. His allergist continues to follow him. I noticed the patient was not using fluticasone on a consistent basis and explained its mechanism of action. He is encouraged to try using it every day at least for a month.

## 2011-11-06 NOTE — Assessment & Plan Note (Signed)
Plan comparison CT chest to be done at [facility] for comparison with 02/03/2010 study

## 2011-11-06 NOTE — Assessment & Plan Note (Signed)
Plan- related update CPAP  pressure of assessment with autotitration as discussed. He may need a little higher pressure. Weight loss was encouraged.

## 2011-11-25 ENCOUNTER — Telehealth: Payer: Self-pay | Admitting: Internal Medicine

## 2011-11-25 DIAGNOSIS — G4733 Obstructive sleep apnea (adult) (pediatric): Secondary | ICD-10-CM

## 2011-11-25 NOTE — Telephone Encounter (Signed)
[employer] DME CPAP download - adequate compliance. Pressure 9 cwp gives good control. Will reduce current CPAP 14 down to 9 cwp.

## 2011-12-02 ENCOUNTER — Encounter: Payer: Self-pay | Admitting: Internal Medicine

## 2011-12-06 ENCOUNTER — Encounter: Payer: Self-pay | Admitting: Internal Medicine

## 2011-12-06 ENCOUNTER — Ambulatory Visit (INDEPENDENT_AMBULATORY_CARE_PROVIDER_SITE_OTHER): Payer: BC Managed Care – PPO | Admitting: Internal Medicine

## 2011-12-06 ENCOUNTER — Encounter: Payer: Self-pay | Admitting: *Deleted

## 2011-12-06 VITALS — BP 110/72 | HR 63 | Ht 66.0 in | Wt 192.2 lb

## 2011-12-06 DIAGNOSIS — I2789 Other specified pulmonary heart diseases: Secondary | ICD-10-CM

## 2011-12-06 DIAGNOSIS — R918 Other nonspecific abnormal finding of lung field: Secondary | ICD-10-CM

## 2011-12-06 DIAGNOSIS — G4733 Obstructive sleep apnea (adult) (pediatric): Secondary | ICD-10-CM

## 2011-12-06 MED ORDER — AZITHROMYCIN 250 MG PO TABS: ORAL_TABLET | ORAL | Status: AC

## 2011-12-06 NOTE — Progress Notes (Signed)
11/03/11 - 50 yoM never smoker followed for OSA, allergic rhinitis (Dr Lucie Leather), Psoriasis LOV-06/10/2010 He declines flu vaccine. He continues to use CPAP 14/ [employer] all night every night but sometimes snores through the mask. He decided not to have palatoplasty surgery with Dr. Haroldine Laws. He has history of small calcified and noncalcified lung nodules on CT scan 02/02/2010. He is interested in followup of this. 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 He comes from [place]. He denies change in shortness of breath with exertion and goes dancing with no chest pain. Notices occasional nonexertional substernal pressure discomfort which is relieved by stretching and not related to reflux.  12/06/11-50 yoM never smoker followed for OSA, Lung nodules, Hx +PPD/ INH, allergic rhinitis (Dr Lucie Leather), Psoriasis,  He declines flu shot. This week he has coughed up some green sputum but otherwise has felt stable. He denies night sweats, fever, swollen glands, weight loss or rash. His CPAP was titrated to 8 CWP. Previously was at 14 with some leaks. He has not yet gotten the machine reset by [employer]. As discussed previously, CT scan had shown calcified and noncalcified nodules. He has a history of known positive PPD treated for 6 months with INH.  Echocardiogram 03/16/2010 showed normal pulmonary artery pressure with EF 55-60%.  ROS-see HPI Constitutional:   No-   weight loss, night sweats, fevers, chills, fatigue, lassitude. HEENT:   No-  headaches, difficulty swallowing, tooth/dental problems, sore throat,       No-  sneezing, itching, ear ache,  + nasal congestion, post nasal drip,  CV:  +atypical chest pain,No- orthopnea, PND, swelling in lower extremities, anasarca,                                  dizziness, palpitations Resp: No-   shortness of breath with exertion or at rest.              +  productive cough,  No non-productive cough,  No- coughing up of blood.          No-   change in color of mucus.  No- wheezing.   Skin: No-   rash or lesions. GI:  No-   heartburn, indigestion, abdominal pain, nausea, vomiting, diarrhea,                 change in bowel habits, loss of appetite GU: No-   dysuria, change in color of urine, no urgency or frequency.  No- flank pain. MS:  No-   joint pain or swelling.  No- decreased range of motion.  No- back pain. Neuro-     nothing unusual Psych:  No- change in mood or affect. No depression or anxiety.  No memory loss.  OBJ General- Alert, Oriented, Affect-appropriate, Distress- none acute; overweight Skin- Acne vs psoriatic lesions on face, excoriation- none Lymphadenopathy- none Head- atraumatic            Eyes- Gross vision intact, PERRLA, conjunctivae clear secretions            Ears- Hearing, canals-normal            Nose- Clear, no-Septal dev, mucus, polyps, erosion, perforation             Throat- Mallampati III , mucosa clear , drainage- none, tonsils- atrophic. Frequent throat clearing Neck- flexible , trachea midline, no stridor , thyroid nl, carotid no  bruit Chest - symmetrical excursion , unlabored           Heart/CV- RRR , no murmur , no gallop  , no rub, nl s1 s2                           - JVD- none , edema- none, stasis changes- none, varices- none           Lung- clear to P&A, wheeze- none, cough- none , dullness-none, rub- none           Chest wall-  Abd- tender-no, distended-no, bowel sounds-present, HSM- no Br/ Gen/ Rectal- Not done, not indicated Extrem- cyanosis- none, clubbing, none, atrophy- none, strength- nl Neuro- grossly intact to observation

## 2011-12-06 NOTE — Patient Instructions (Addendum)
We will plan on seeing you to repeat a CT chest without contrast dye in December of 2013 to follow up the lung nodules  For your current chest cold/ bronchitis- extra fluids and Mucinex may help, with comfort meds like Tylenol etc. Script for Z pak provided to use if you get worse.   Ask Christoper Allegra to change your CPAP to 8. If that is not ok- please let me know.  Let your primary physician know about the coronary artery calcification on your CT scan. You had previously seen Dr Shirlee Latch in [facility] cardiology when conside or ring surgery in 2011.

## 2011-12-09 DIAGNOSIS — R918 Other nonspecific abnormal finding of lung field: Secondary | ICD-10-CM | POA: Insufficient documentation

## 2011-12-09 NOTE — Assessment & Plan Note (Signed)
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 We discussed the efficacy of I and H. prophylaxis and reviewed warning symptoms to watch for over time I do not think he has active tuberculosis.

## 2011-12-09 NOTE — Assessment & Plan Note (Signed)
CPAP pressure had been at 14 but most recently titrated to 8. He was leaking at 14 so we'll see how he feels at 8 CWP. I've emphasized that he should call if he doesn't like the way it feels.

## 2012-01-18 ENCOUNTER — Telehealth: Payer: Self-pay | Admitting: Internal Medicine

## 2012-01-18 NOTE — Telephone Encounter (Signed)
Faxed 26 pages of Dr. Roxy Cedar and Cardiology Office Notes to Lurena Joiner at [facility] on 11/07/2011

## 2012-06-05 ENCOUNTER — Encounter: Payer: Self-pay | Admitting: Internal Medicine

## 2012-06-05 ENCOUNTER — Ambulatory Visit (INDEPENDENT_AMBULATORY_CARE_PROVIDER_SITE_OTHER): Payer: BC Managed Care – PPO | Admitting: Internal Medicine

## 2012-06-05 VITALS — BP 104/78 | HR 81 | Ht 66.0 in | Wt 195.4 lb

## 2012-06-05 DIAGNOSIS — R918 Other nonspecific abnormal finding of lung field: Secondary | ICD-10-CM

## 2012-06-05 DIAGNOSIS — G4733 Obstructive sleep apnea (adult) (pediatric): Secondary | ICD-10-CM

## 2012-06-05 DIAGNOSIS — Z8611 Personal history of tuberculosis: Secondary | ICD-10-CM

## 2012-06-05 NOTE — Patient Instructions (Addendum)
Order- CT chest, non contrast   Dx lung nodules  Continue CPAP  Please call as needed

## 2012-06-05 NOTE — Progress Notes (Signed)
11/03/11 - 50 yoM never smoker followed for OSA, allergic rhinitis (Dr Lucie Leather), Psoriasis LOV-06/10/2010 He declines flu vaccine. He continues to use CPAP 14/ [employer] all night every night but sometimes snores through the mask. He decided not to have palatoplasty surgery with Dr. Haroldine Laws. He has history of small calcified and noncalcified lung nodules on CT scan 02/02/2010. He is interested in followup of this. 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 He comes from [place]. He denies change in shortness of breath with exertion and goes dancing with no chest pain. Notices occasional nonexertional substernal pressure discomfort which is relieved by stretching and not related to reflux.  12/06/11-50 yoM never smoker followed for OSA, Lung nodules, Hx +PPD/ INH, allergic rhinitis (Dr Lucie Leather), Psoriasis,  He declines flu shot. This week he has coughed up some green sputum but otherwise has felt stable. He denies night sweats, fever, swollen glands, weight loss or rash. His CPAP was titrated to 8 CWP. Previously was at 14 with some leaks. He has not yet gotten the machine reset by [employer]. As discussed previously, CT scan had shown calcified and noncalcified nodules. He has a history of known positive PPD treated for 6 months with INH.  Echocardiogram 03/16/2010 showed normal pulmonary artery pressure with EF 55-60%.  06/05/12 -50 yoM never smoker followed for OSA, Lung nodules, Hx +PPD/ INH, allergic rhinitis (Dr Lucie Leather), Psoriasis,  Patient states doing good. Except states has had a cyst in the groin area x weeks, wants to know  who he would need to see about this.  CT chest [facility] 02/22/10- calc and non calc nodules, CAD CPAP 8/ [employer] -doing well used all night every night. Occasional mild cough and postnasal drip. He is managed by his allergist. 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 At this point he is asymptomatic and we will just look again to make sure there is nothing progressive.  ROS-see HPI Constitutional:   No-   weight loss, night sweats, fevers, chills, fatigue, lassitude. HEENT:   No-  headaches, difficulty swallowing, tooth/dental problems, sore throat,       No-  sneezing, itching, ear ache,  + nasal congestion, post nasal drip,  CV:  +atypical chest pain,No- orthopnea, PND, swelling in lower extremities, anasarca, dizziness, palpitations Resp: No-   shortness of breath with exertion or at rest.              No-  productive cough,  No non-productive cough,  No- coughing up of blood.              No-   change in color of mucus.  No- wheezing.   Skin: No-   rash or lesions. GI:  No-   heartburn, indigestion, abdominal pain, nausea, vomiting,  GU: MS:  No-   joint pain or swelling.   Neuro-     nothing unusual Psych:  No- change in mood or affect. No depression or anxiety.  No memory loss.  OBJ General- Alert, Oriented, Affect-appropriate, Distress- none acute; overweight Skin- Acne vs psoriatic lesions on face, excoriation- none Lymphadenopathy- none Head- atraumatic            Eyes- Gross vision intact, PERRLA, conjunctivae clear secretions            Ears- Hearing, canals-normal  Nose- Clear, no-Septal dev, mucus, polyps, erosion, perforation             Throat- Mallampati III , mucosa clear , drainage- none, tonsils- atrophic. Frequent throat clearing Neck- flexible , trachea midline, no stridor , thyroid nl, carotid no bruit Chest - symmetrical excursion , unlabored           Heart/CV- RRR , no murmur , no gallop  , no rub, nl s1 s2                           - JVD- none , edema- none, stasis changes- none, varices- none           Lung- clear to P&A, wheeze- none, cough- none , dullness-none, rub- none           Chest wall-  Abd-  Br/ Gen/ Rectal- Not done,  not indicated Extrem- cyanosis- none, clubbing, none, atrophy- none, strength- nl Neuro- grossly intact to observation

## 2012-06-07 ENCOUNTER — Ambulatory Visit (INDEPENDENT_AMBULATORY_CARE_PROVIDER_SITE_OTHER)
Admission: RE | Admit: 2012-06-07 | Discharge: 2012-06-07 | Disposition: A | Discharge status: Home / Self Care | Payer: BC Managed Care – PPO | Source: Ambulatory Visit | Attending: Internal Medicine | Admitting: Internal Medicine

## 2012-06-07 DIAGNOSIS — R918 Other nonspecific abnormal finding of lung field: Secondary | ICD-10-CM

## 2012-06-07 IMAGING — CT CT CHEST W/O CM
2 of 3 series · 15 of 36 positions shown, 18 images · non-contrast
Comparison: Chest radiographs [DATE].  Report from chest CT
[DATE] done at [HOSPITAL].

CLINICAL DATA: Follow-up lung nodules.

CT CHEST WITHOUT CONTRAST
TECHNIQUE: Multidetector CT imaging of the chest was performed
following the standard protocol without IV contrast.

[Series 2: chest routine with · axial · 0.74mm/px · z∈[-258,-28]mm · 12 of 54 slices shown, 15 images]
[im 4/54  mediastinal]
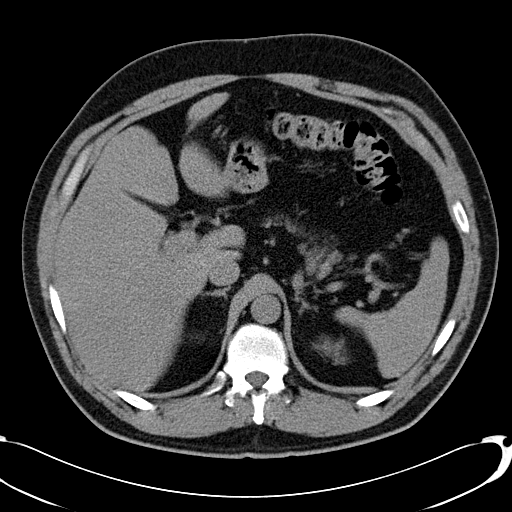
[im 4/54  lung]
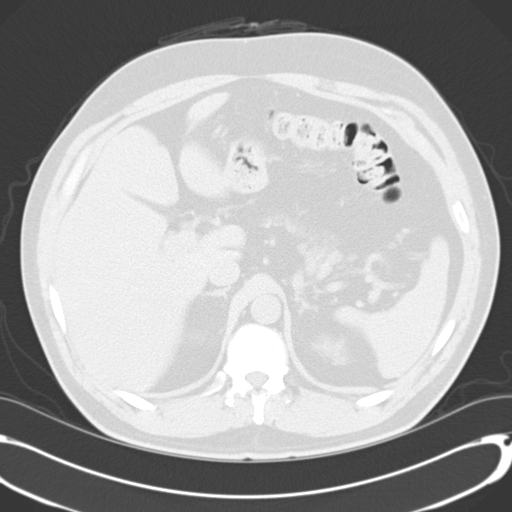
[im 8/54  lung]
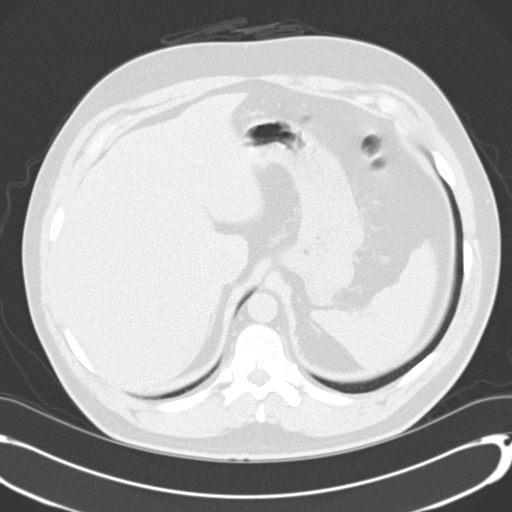
[im 12/54  lung]
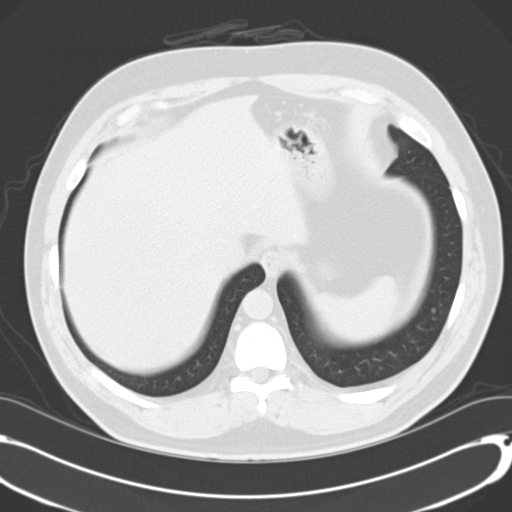
[im 16/54  lung]
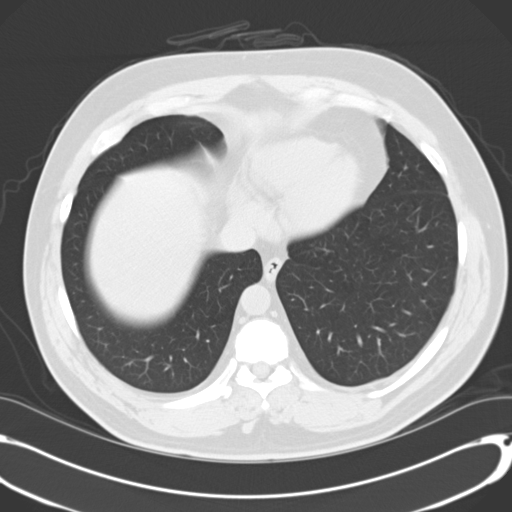
[im 20/54  mediastinal]
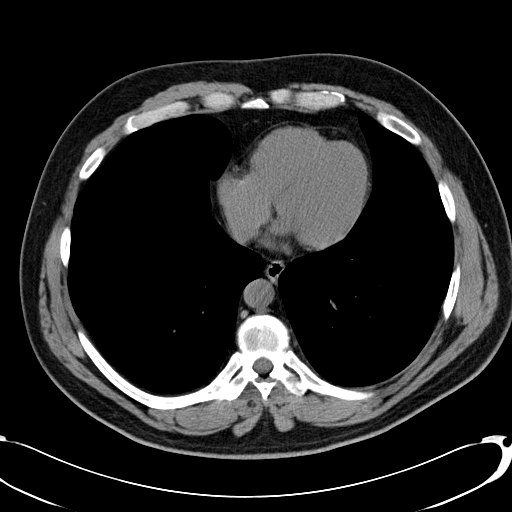
[im 20/54  lung]
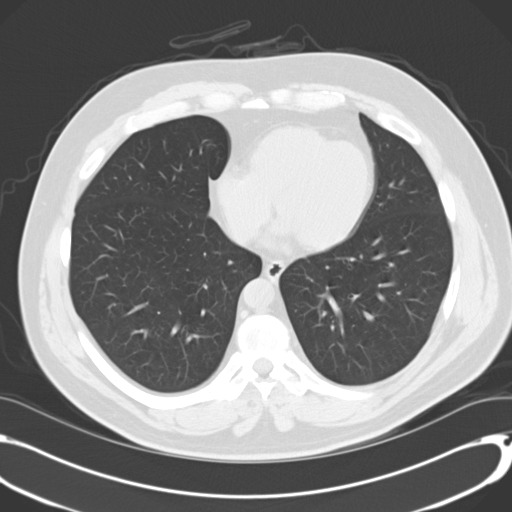
[im 24/54  lung]
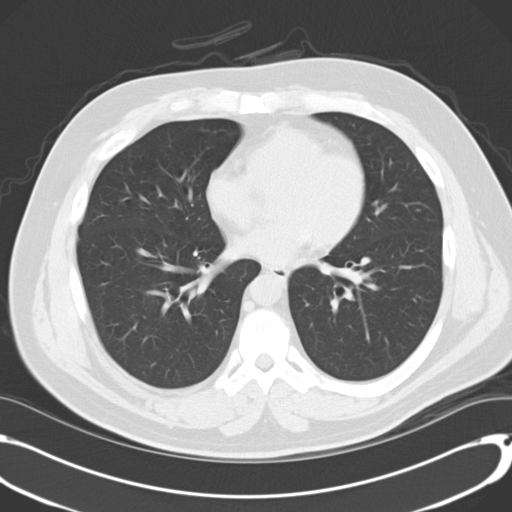
[im 30/54  lung]
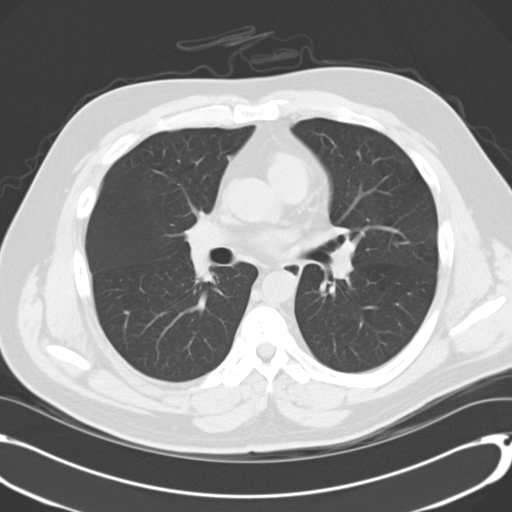
[im 34/54  lung]
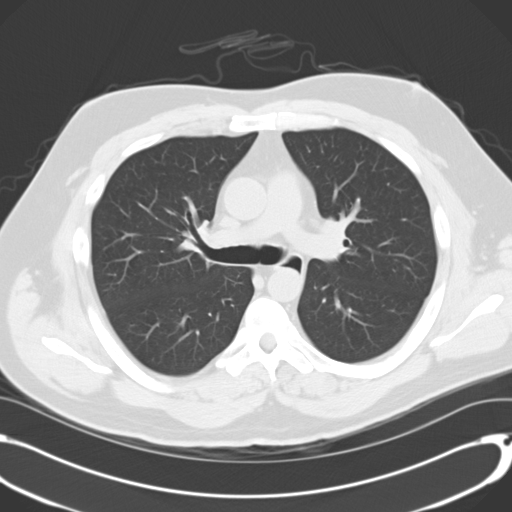
[im 38/54  mediastinal]
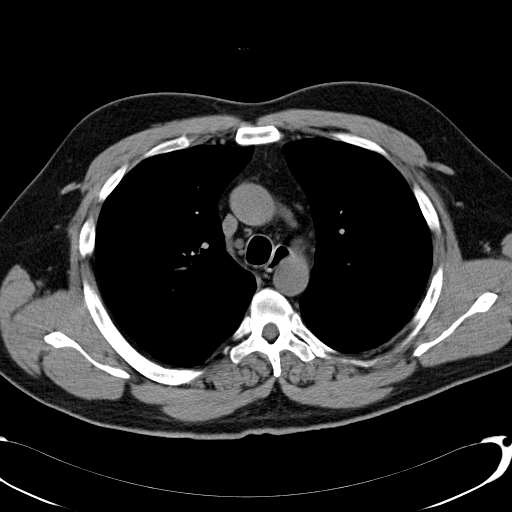
[im 38/54  lung]
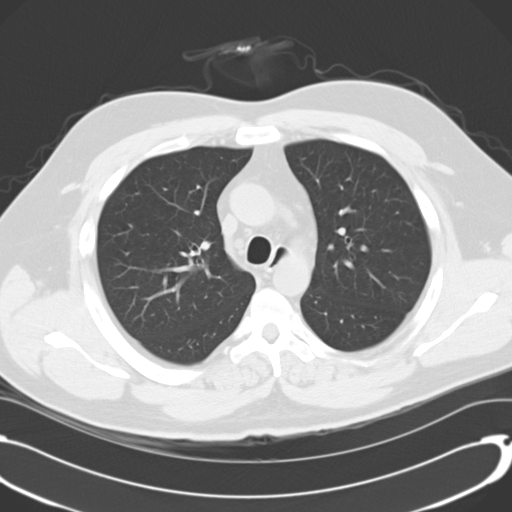
[im 42/54  lung]
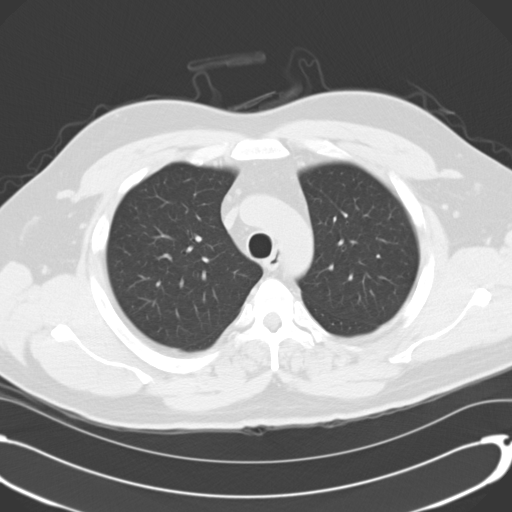
[im 46/54  lung]
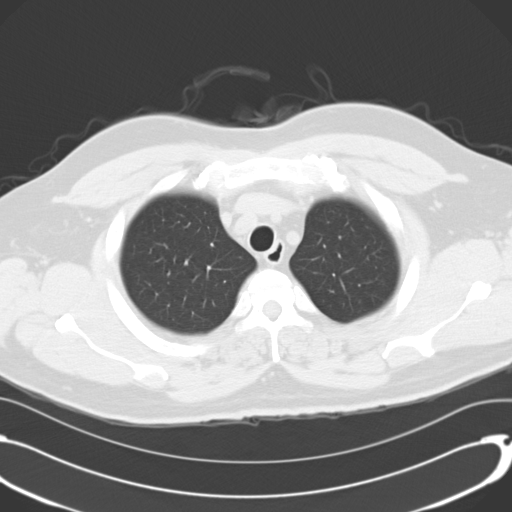
[im 50/54  lung]
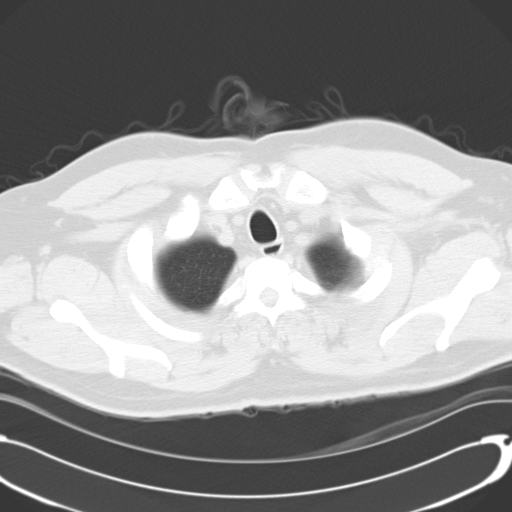

[Series 602: cor · coronal · 0.74mm/px · 3 of 117 slices shown]
[im 24/117  lung]
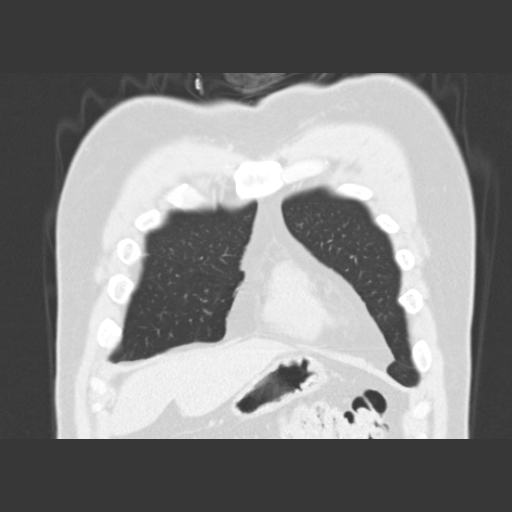
[im 47/117  lung]
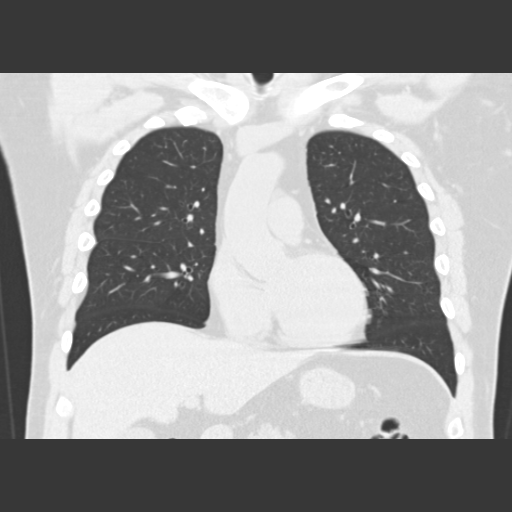
[im 70/117  lung]
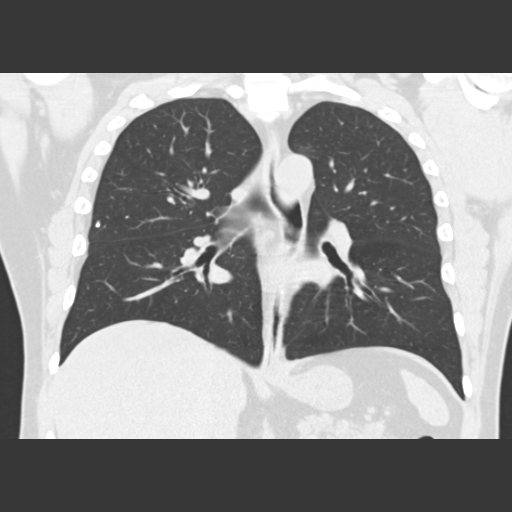

[15 of 36 positions shown; findings below may reference images not displayed]

FINDINGS: There are no enlarged mediastinal or hilar lymph nodes.
There are faint coronary artery calcifications.  There is no
pleural or pericardial effusion.

There is a densely calcified granuloma peripherally in the right
upper lobe on image 23. There is a 2 mm subpleural nodule in the
right upper lobe on image 7.  There is nodularity along the minor
fissure on images 26 and 30.  There is a 4 mm noncalcified nodule
in the left lower lobe on image 43.  No suspicious pulmonary
nodules are identified.  There is no endobronchial lesion or
confluent airspace opacity.

Images through the upper abdomen demonstrate mild hepatic steatosis
and small calcified gallstones.  There is no adrenal mass.
IMPRESSION: 1.  Small pulmonary nodules seem stable based on comparison report
and include a calcified right upper lobe granuloma.  These are
likely all granulomas.  No morphologically suspicious nodules
demonstrated.
2.  No adenopathy or acute process.
3.  Mild hepatic steatosis and cholelithiasis.

## 2012-06-10 NOTE — Assessment & Plan Note (Signed)
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 In that case 6 months of INH may not always be sufficient. Since he is asymptomatic, we will repeat the CT scan wants to make further stable compared with 2011

## 2012-06-10 NOTE — Assessment & Plan Note (Signed)
Good compliance and control with no changes needed. 

## 2012-06-14 NOTE — Progress Notes (Signed)
Quick Note:  LMTCB ______ 

## 2012-06-14 NOTE — Progress Notes (Signed)
Quick Note:  Pt aware of results. ______ 

## 2012-12-26 HISTORY — PX: UVULOPALATOPHARYNGOPLASTY: SHX827

## 2012-12-26 HISTORY — PX: SEPTOPLASTY: SUR1290

## 2012-12-26 HISTORY — PX: TONSILLECTOMY: SUR1361

## 2013-06-06 ENCOUNTER — Ambulatory Visit: Payer: BC Managed Care – PPO | Admitting: Internal Medicine

## 2014-06-26 ENCOUNTER — Ambulatory Visit: Payer: BC Managed Care – PPO

## 2014-06-27 ENCOUNTER — Other Ambulatory Visit: Payer: Self-pay | Admitting: Family Medicine

## 2014-06-27 ENCOUNTER — Ambulatory Visit (INDEPENDENT_AMBULATORY_CARE_PROVIDER_SITE_OTHER): Payer: BC Managed Care – PPO | Admitting: Family Medicine

## 2014-06-27 VITALS — BP 136/94 | HR 61 | Temp 98.2°F | Resp 20 | Ht 66.0 in | Wt 185.1 lb

## 2014-06-27 DIAGNOSIS — J309 Allergic rhinitis, unspecified: Secondary | ICD-10-CM

## 2014-06-27 DIAGNOSIS — I10 Essential (primary) hypertension: Secondary | ICD-10-CM

## 2014-06-27 DIAGNOSIS — E785 Hyperlipidemia, unspecified: Secondary | ICD-10-CM

## 2014-06-27 DIAGNOSIS — Z23 Encounter for immunization: Secondary | ICD-10-CM

## 2014-06-27 DIAGNOSIS — Z125 Encounter for screening for malignant neoplasm of prostate: Secondary | ICD-10-CM

## 2014-06-27 DIAGNOSIS — L408 Other psoriasis: Secondary | ICD-10-CM

## 2014-06-27 DIAGNOSIS — Z0000 Encounter for general adult medical examination without abnormal findings: Secondary | ICD-10-CM

## 2014-06-27 DIAGNOSIS — Z7251 High risk heterosexual behavior: Secondary | ICD-10-CM

## 2014-06-27 DIAGNOSIS — L409 Psoriasis, unspecified: Secondary | ICD-10-CM | POA: Insufficient documentation

## 2014-06-27 LAB — TSH: TSH: 2.403 u[IU]/mL (ref 0.350–4.500)

## 2014-06-27 LAB — CBC WITH DIFFERENTIAL/PLATELET
Basophils Absolute: 0.1 10*3/uL (ref 0.0–0.1)
Basophils Relative: 1 % (ref 0–1)
EOS ABS: 0.1 10*3/uL (ref 0.0–0.7)
EOS PCT: 2 % (ref 0–5)
HCT: 47.5 % (ref 39.0–52.0)
Hemoglobin: 16.8 g/dL (ref 13.0–17.0)
Lymphocytes Relative: 19 % (ref 12–46)
Lymphs Abs: 1.1 10*3/uL (ref 0.7–4.0)
MCH: 30.3 pg (ref 26.0–34.0)
MCHC: 35.4 g/dL (ref 30.0–36.0)
MCV: 85.6 fL (ref 78.0–100.0)
Monocytes Absolute: 0.5 10*3/uL (ref 0.1–1.0)
Monocytes Relative: 9 % (ref 3–12)
NEUTROS PCT: 69 % (ref 43–77)
Neutro Abs: 3.9 10*3/uL (ref 1.7–7.7)
PLATELETS: 308 10*3/uL (ref 150–400)
RBC: 5.55 MIL/uL (ref 4.22–5.81)
RDW: 13.6 % (ref 11.5–15.5)
WBC: 5.7 10*3/uL (ref 4.0–10.5)

## 2014-06-27 LAB — POCT URINALYSIS DIPSTICK
BILIRUBIN UA: NEGATIVE
GLUCOSE UA: NEGATIVE
KETONES UA: NEGATIVE
Leukocytes, UA: NEGATIVE
NITRITE UA: NEGATIVE
Protein, UA: NEGATIVE
RBC UA: NEGATIVE
SPEC GRAV UA: 1.01
Urobilinogen, UA: 0.2
pH, UA: 5.5

## 2014-06-27 LAB — COMPLETE METABOLIC PANEL WITH GFR
ALK PHOS: 49 U/L (ref 39–117)
ALT: 29 U/L (ref 0–53)
AST: 23 U/L (ref 0–37)
Albumin: 4.4 g/dL (ref 3.5–5.2)
BILIRUBIN TOTAL: 0.6 mg/dL (ref 0.2–1.2)
BUN: 13 mg/dL (ref 6–23)
CO2: 28 mEq/L (ref 19–32)
CREATININE: 1.12 mg/dL (ref 0.50–1.35)
Calcium: 9.7 mg/dL (ref 8.4–10.5)
Chloride: 100 mEq/L (ref 96–112)
GFR, EST AFRICAN AMERICAN: 86 mL/min
GFR, EST NON AFRICAN AMERICAN: 75 mL/min
Glucose, Bld: 104 mg/dL — ABNORMAL HIGH (ref 70–99)
Potassium: 3.6 mEq/L (ref 3.5–5.3)
Sodium: 139 mEq/L (ref 135–145)
TOTAL PROTEIN: 7.2 g/dL (ref 6.0–8.3)

## 2014-06-27 LAB — RPR

## 2014-06-27 LAB — HEMOGLOBIN A1C
Hgb A1c MFr Bld: 6 % — ABNORMAL HIGH (ref <5.7)
MEAN PLASMA GLUCOSE: 126 mg/dL — AB (ref <117)

## 2014-06-27 LAB — LIPID PANEL
CHOL/HDL RATIO: 4.1 ratio
CHOLESTEROL: 158 mg/dL (ref 0–200)
HDL: 39 mg/dL — ABNORMAL LOW (ref >39)
LDL Cholesterol: 79 mg/dL (ref 0–99)
Triglycerides: 199 mg/dL — ABNORMAL HIGH (ref <150)
VLDL: 40 mg/dL (ref 0–40)

## 2014-06-27 LAB — HIV ANTIBODY (ROUTINE TESTING W REFLEX): HIV 1&2 Ab, 4th Generation: NONREACTIVE

## 2014-06-27 MED ORDER — CLOBETASOL PROPIONATE 0.05 % EX FOAM: Freq: Two times a day (BID) | CUTANEOUS | Status: DC

## 2014-06-27 MED ORDER — AMLODIPINE BESYLATE 5 MG PO TABS: 5.0000 mg | ORAL_TABLET | Freq: Every day | ORAL | Status: DC

## 2014-06-27 MED ORDER — CLINDAMYCIN PHOSPHATE 1 % EX GEL: Freq: Two times a day (BID) | CUTANEOUS | Status: DC

## 2014-06-27 MED ORDER — DESONIDE 0.05 % EX CREA: TOPICAL_CREAM | Freq: Two times a day (BID) | CUTANEOUS | Status: DC

## 2014-06-27 MED ORDER — ROSUVASTATIN CALCIUM 10 MG PO TABS: 10.0000 mg | ORAL_TABLET | Freq: Every day | ORAL | Status: DC

## 2014-06-27 MED ORDER — FLUTICASONE PROPIONATE 50 MCG/ACT NA SUSP: 2.0000 | Freq: Every day | NASAL | Status: DC

## 2014-06-27 MED ORDER — HYDROCHLOROTHIAZIDE 12.5 MG PO TABS: 12.5000 mg | ORAL_TABLET | Freq: Every day | ORAL | Status: DC

## 2014-06-27 NOTE — Progress Notes (Signed)
Patient ID: Rickey Cruz, male   DOB: [birth year], 53 y.o.   MRN: [id number] This chart was scribed for [name], MD by Jeanell Sparrow, ED Scribe. This patient was seen in room Room 13 and the patient's care was started at 9:29 AM.    Subjective:    Patient ID: Rickey Cruz, male    DOB: 09-05-1961, 53 y.o.   MRN: [id number]  06/27/2014  Annual Exam, Hypertension and Hyperlipidemia   HPI HPI Comments: Rickey Cruz is a 53 y.o. male who presents to the [facility] for a physical. He states that he had a Colonoscopy with [facility] 3 years. He reports that a precancerous polyp was found. He reports that he had a eye exam last year. He states that he has saw his dentist 6 months ago and will see dentist in 4 days. He states that he takes Flonase, patanol, and Zyrtec when needed for allergies. Pt reports a hx of psoriasis, HTN, and hypercholestrolemia. He also reports a hx of an appendectomy and a UPP surgery.  Last Tetanus unknown.  He states that his mother has no health problems. He reports that he does not know the health history of his father. He states that his siblings have no health problems.  He reports that he has been separated for 2 years. He states that he was married for 13 years before then. He states that he is currently not dating. He states that he has a child that is 26 years old who lives with his mom.  Pt states that he lives with a roommate. He states that he currently has a Printmaker job for [employer] He states that he does not smoke or do any drugs except for an occasional drink. He states that he does not exercise.   He states that he has no hx of STDs. He states that he has had more than three sexual partners, both male and male.  He states that he has no known allergies. He also states that he needs a refill for his Crestor. He reports no hearing problems or dizziness. He states that he has been having HA that seems related to elevated blood  pressure.  +HA Double vision at close, no blurry vision -heart problems -SOB -cough Neck pain in everyday in the afternoon BM everyday No bloody or back stool When on bp meds pt gets nasuea and diarrhea Sometimes had difficulty urinate +sex drive +erections Recently trouble sleeping due to new job Sometimes been depressed anixety  Does not know how to deal with difficult people Review of Systems  Constitutional: Negative.   Eyes: Positive for redness.  Respiratory: Negative.   Cardiovascular: Negative.   Gastrointestinal: Negative.   Endocrine: Negative.   Genitourinary: Positive for difficulty urinating.  Musculoskeletal: Positive for neck pain and neck stiffness.  Skin: Negative.   Allergic/Immunologic: Positive for environmental allergies.  Neurological: Positive for headaches. Negative for dizziness, syncope, weakness and light-headedness.  Hematological: Negative.   Psychiatric/Behavioral: Positive for dysphoric mood. Negative for sleep disturbance. The patient is nervous/anxious.     Past Medical History  Diagnosis Date  . Asthma     seasonal, with allergies  . Hyperlipidemia   . Hypertension   . 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 Psoriasis     [name]  . History of kidney stones   . Colon polyp 10/27/2011  . Allergy     Flonase, Patanol, Zyrtec  . Arthritis  psoriasis   Past Surgical History  Procedure Laterality Date  . Appendectomy    . Colonoscopy  10/27/2011    single polyp. Repeat 5 years.  . Polypectomy    . Tonsillectomy    . Septoplasty      No Known Allergies Current Outpatient Prescriptions  Medication Sig Dispense Refill  . amLODipine (NORVASC) 5 MG tablet Take 1 tablet (5 mg total) by mouth daily.  90 tablet  1  . clobetasol (OLUX) 0.05 % topical foam Apply topically 2 (two) times daily.  100 g  3  . desonide (DESOWEN) 0.05 % cream Apply topically 2 (two) times daily.  60 g  2  . fluticasone  (FLONASE) 50 MCG/ACT nasal spray Place 2 sprays into both nostrils daily.  16 g  11  . hydrochlorothiazide (HYDRODIURIL) 12.5 MG tablet Take 1 tablet (12.5 mg total) by mouth daily.  90 tablet  1  . rosuvastatin (CRESTOR) 10 MG tablet Take 1 tablet (10 mg total) by mouth daily.  90 tablet  3   No current facility-administered medications for this visit.   History   Social History  . Marital Status: Single    Spouse Name: N/A    Number of Children: 1  . Years of Education: N/A   Occupational History  . TEACHER     [employer]   Social History Main Topics  . Smoking status: Never Smoker   . Smokeless tobacco: Never Used  . Alcohol Use: No  . Drug Use: No  . Sexual Activity: Yes     Comment: bisexual   Other Topics Concern  . Not on file   Social History Narrative   Marital status: separated x 1 year after 13 years of marriage; not dating.      Children:  1 child (15).      Lives: with roommate; sees son daily      Employment: Pharmacist, hospital; transition from Administration to teaching again in 2015; moved from [place], [place] in 2015; [place] teacher      Tobacco: none      Alcohol: socially; weekends.      Drugs:  None      Exercise:  None      Seatbelt:  100%      Guns:  None      Sexual activity:  No STDs.  Total sexual partners = 3+; bisexual.          Family History  Problem Relation Age of Onset  . Colon cancer Neg Hx   . Esophageal cancer Neg Hx   . Stomach cancer Neg Hx        Objective:    BP 136/94  Pulse 61  Temp(Src) 98.2 F (36.8 C) (Oral)  Resp 20  Ht 5\' 6"  (1.676 m)  Wt 185 lb 2 oz (83.972 kg)  BMI 29.89 kg/m2  SpO2 97% Physical Exam  Nursing note and vitals reviewed. Constitutional: He is oriented to person, place, and time. He appears well-developed and well-nourished. No distress.  HENT:  Head: Normocephalic and atraumatic.  Right Ear: Tympanic membrane and external ear normal.  Left Ear: Tympanic membrane  and external ear normal.  Nose: Nose normal.  Mouth/Throat: Oropharynx is clear and moist. No oropharyngeal exudate.  Eyes: Conjunctivae and EOM are normal. Pupils are equal, round, and reactive to light.  Neck: Normal range of motion. Neck supple. Carotid bruit is not present. No thyromegaly present.  No meningismus.  Cardiovascular: Normal rate, regular  rhythm, normal heart sounds and intact distal pulses.  Exam reveals no gallop and no friction rub.   No murmur heard. Pulmonary/Chest: Effort normal and breath sounds normal. No respiratory distress. He has no wheezes. He has no rales.  Abdominal: Soft. Bowel sounds are normal. He exhibits no distension and no mass. There is no tenderness. There is no rebound and no guarding. Hernia confirmed negative in the right inguinal area and confirmed negative in the left inguinal area.  Genitourinary: Rectum normal, testes normal and penis normal. Prostate is enlarged. Prostate is not tender. Right testis shows no mass, no swelling and no tenderness. Left testis shows no mass, no swelling and no tenderness. Circumcised.  Musculoskeletal: Normal range of motion. He exhibits no edema and no tenderness.  Lymphadenopathy:    He has no cervical adenopathy.       Right: No inguinal adenopathy present.       Left: No inguinal adenopathy present.  Neurological: He is alert and oriented to person, place, and time. No cranial nerve deficit. He exhibits normal muscle tone. Coordination normal.  No ataxia on finger to nose bilaterally. No pronator drift. 5/5 strength throughout. CN 2-12 intact. Negative Romberg. Equal grip strength. Sensation intact. Gait is normal.   Skin: Skin is warm and dry. No rash noted. He is not diaphoretic.  Psychiatric: He has a normal mood and affect. His behavior is normal.   Results for orders placed in visit on 06/27/14  POCT URINALYSIS DIPSTICK      Result Value Ref Range   Color, UA yellow     Clarity, UA clear     Glucose, UA  neg     Bilirubin, UA neg     Ketones, UA neg     Spec Grav, UA 1.010     Blood, UA neg     pH, UA 5.5     Protein, UA neg     Urobilinogen, UA 0.2     Nitrite, UA neg     Leukocytes, UA Negative     EKG: NSR  TDAP, HEPATITISA#1, HEPATITIS B#2, MENVEO ADMINISTERED IN OFFICE.    Assessment & Plan:  Routine general medical examination at a health care facility - Plan: POCT urinalysis dipstick, CBC with Differential, COMPLETE METABOLIC PANEL WITH GFR, Hemoglobin A1c, TSH, Lipid panel, GC/Chlamydia Probe Amp, RPR, HIV antibody, PSA, EKG 12-Lead  Problems related to high-risk sexual behavior - Plan: POCT urinalysis dipstick, CBC with Differential, COMPLETE METABOLIC PANEL WITH GFR, Hemoglobin A1c, TSH, Lipid panel, GC/Chlamydia Probe Amp, RPR, HIV antibody, PSA, EKG 12-Lead  HYPERTENSION  HYPERLIPIDEMIA  ALLERGIC RHINITIS  Psoriasis  Need for prophylactic vaccination with combined diphtheria-tetanus-pertussis (DTP) vaccine  Need for prophylactic vaccination and inoculation against viral hepatitis  Need for meningococcal vaccination  Prostate cancer screening  1. Complete Physical Examination:  Anticipatory guidance --- weight loss, exercise.  Colonoscopy UTD: repeat due to 2017.  S/p Menveo, Hepatitis A#1, Hepatitis B#1, TDAP.   2.  High risk sexual behavior: bisexual.  Obtin GC/Chlam, RPR, HIV.  Asymptomatic currently; s/p Hepatitis A and B#1. 3.  Screening for prostate cancer: s/p DRE: obtain PSA. 4.  HTN: moderately controlled; pt desires three month trial of weight loss and exercise; declined increase of medication.  Refills provided; f/u three months. 5. Hyperlipidemia: controlled; obtain labs; refill provided. 6.  Psoriasis:  Controlled; refills provided. 7.  Allergic Rhinitis: controlled; refills provided. 8.  S/p Hepatitis A#1; RTC six months for #2. 9. S/p Hepatitis B#1; RTC three months  for #2. 10. S/p Menveo. 11. S/p TDAP  Meds ordered this encounter   Medications  . amLODipine (NORVASC) 5 MG tablet    Sig: Take 1 tablet (5 mg total) by mouth daily.    Dispense:  90 tablet    Refill:  1  . hydrochlorothiazide (HYDRODIURIL) 12.5 MG tablet    Sig: Take 1 tablet (12.5 mg total) by mouth daily.    Dispense:  90 tablet    Refill:  1  . rosuvastatin (CRESTOR) 10 MG tablet    Sig: Take 1 tablet (10 mg total) by mouth daily.    Dispense:  90 tablet    Refill:  3  . clobetasol (OLUX) 0.05 % topical foam    Sig: Apply topically 2 (two) times daily.    Dispense:  100 g    Refill:  3  . desonide (DESOWEN) 0.05 % cream    Sig: Apply topically 2 (two) times daily.    Dispense:  60 g    Refill:  2  . fluticasone (FLONASE) 50 MCG/ACT nasal spray    Sig: Place 2 sprays into both nostrils daily.    Dispense:  16 g    Refill:  11    Return in about 3 months (around 09/27/2014) for recheck high blood pressure, headaches, Hepatitis B#2.   I personally performed the services described in this documentation, which was scribed in my presence.  The recorded information has been reviewed and is accurate.  [name], M.D.  [facility] [address] [place], [place]  [address] [phone] phone [phone] fax

## 2014-06-27 NOTE — Progress Notes (Signed)
Left a message for patient to return call to schedule 3 month follow up appointment with Dr. Tamala Julian.

## 2014-06-28 ENCOUNTER — Emergency Department (HOSPITAL_COMMUNITY): Payer: BC Managed Care – HMO

## 2014-06-28 ENCOUNTER — Encounter (HOSPITAL_COMMUNITY): Payer: Self-pay | Admitting: Emergency Medicine

## 2014-06-28 ENCOUNTER — Emergency Department (HOSPITAL_COMMUNITY)
Admission: EM | Admit: 2014-06-28 | Discharge: 2014-06-28 | Disposition: A | Discharge status: Home / Self Care | Payer: BC Managed Care – HMO | Attending: Emergency Medicine | Admitting: Emergency Medicine

## 2014-06-28 DIAGNOSIS — J45909 Unspecified asthma, uncomplicated: Secondary | ICD-10-CM | POA: Insufficient documentation

## 2014-06-28 DIAGNOSIS — Z860100 Personal history of colon polyps, unspecified: Secondary | ICD-10-CM | POA: Insufficient documentation

## 2014-06-28 DIAGNOSIS — Z872 Personal history of diseases of the skin and subcutaneous tissue: Secondary | ICD-10-CM | POA: Insufficient documentation

## 2014-06-28 DIAGNOSIS — K8020 Calculus of gallbladder without cholecystitis without obstruction: Secondary | ICD-10-CM | POA: Insufficient documentation

## 2014-06-28 DIAGNOSIS — Z79899 Other long term (current) drug therapy: Secondary | ICD-10-CM | POA: Insufficient documentation

## 2014-06-28 DIAGNOSIS — Z9889 Other specified postprocedural states: Secondary | ICD-10-CM | POA: Insufficient documentation

## 2014-06-28 DIAGNOSIS — Z8739 Personal history of other diseases of the musculoskeletal system and connective tissue: Secondary | ICD-10-CM | POA: Insufficient documentation

## 2014-06-28 DIAGNOSIS — IMO0002 Reserved for concepts with insufficient information to code with codable children: Secondary | ICD-10-CM | POA: Insufficient documentation

## 2014-06-28 DIAGNOSIS — Z9089 Acquired absence of other organs: Secondary | ICD-10-CM | POA: Insufficient documentation

## 2014-06-28 DIAGNOSIS — Z792 Long term (current) use of antibiotics: Secondary | ICD-10-CM | POA: Insufficient documentation

## 2014-06-28 DIAGNOSIS — Z87442 Personal history of urinary calculi: Secondary | ICD-10-CM | POA: Insufficient documentation

## 2014-06-28 DIAGNOSIS — E785 Hyperlipidemia, unspecified: Secondary | ICD-10-CM | POA: Insufficient documentation

## 2014-06-28 DIAGNOSIS — I10 Essential (primary) hypertension: Secondary | ICD-10-CM | POA: Insufficient documentation

## 2014-06-28 DIAGNOSIS — Z8611 Personal history of tuberculosis: Secondary | ICD-10-CM | POA: Insufficient documentation

## 2014-06-28 LAB — BASIC METABOLIC PANEL
Anion gap: 14 (ref 5–15)
BUN: 19 mg/dL (ref 6–23)
CO2: 26 mEq/L (ref 19–32)
CREATININE: 1.09 mg/dL (ref 0.50–1.35)
Calcium: 9.2 mg/dL (ref 8.4–10.5)
Chloride: 98 mEq/L (ref 96–112)
GFR, EST AFRICAN AMERICAN: 88 mL/min — AB (ref >90)
GFR, EST NON AFRICAN AMERICAN: 76 mL/min — AB (ref >90)
Glucose, Bld: 170 mg/dL — ABNORMAL HIGH (ref 70–99)
Potassium: 3.7 mEq/L (ref 3.7–5.3)
Sodium: 138 mEq/L (ref 137–147)

## 2014-06-28 LAB — PSA: PSA: 4.24 ng/mL — AB (ref <=4.00)

## 2014-06-28 LAB — DIFFERENTIAL
BASOS PCT: 0 % (ref 0–1)
Basophils Absolute: 0 10*3/uL (ref 0.0–0.1)
EOS ABS: 0.1 10*3/uL (ref 0.0–0.7)
Eosinophils Relative: 1 % (ref 0–5)
Lymphocytes Relative: 12 % (ref 12–46)
Lymphs Abs: 1.5 10*3/uL (ref 0.7–4.0)
MONOS PCT: 9 % (ref 3–12)
Monocytes Absolute: 1.1 10*3/uL — ABNORMAL HIGH (ref 0.1–1.0)
Neutro Abs: 9.8 10*3/uL — ABNORMAL HIGH (ref 1.7–7.7)
Neutrophils Relative %: 78 % — ABNORMAL HIGH (ref 43–77)

## 2014-06-28 LAB — CBC
HCT: 45.5 % (ref 39.0–52.0)
Hemoglobin: 16.5 g/dL (ref 13.0–17.0)
MCH: 30.8 pg (ref 26.0–34.0)
MCHC: 36.3 g/dL — AB (ref 30.0–36.0)
MCV: 84.9 fL (ref 78.0–100.0)
Platelets: 294 10*3/uL (ref 150–400)
RBC: 5.36 MIL/uL (ref 4.22–5.81)
RDW: 12.1 % (ref 11.5–15.5)
WBC: 12.6 10*3/uL — ABNORMAL HIGH (ref 4.0–10.5)

## 2014-06-28 LAB — HEPATIC FUNCTION PANEL
ALT: 116 U/L — AB (ref 0–53)
AST: 185 U/L — AB (ref 0–37)
Albumin: 4 g/dL (ref 3.5–5.2)
Alkaline Phosphatase: 75 U/L (ref 39–117)
BILIRUBIN DIRECT: 0.5 mg/dL — AB (ref 0.0–0.3)
BILIRUBIN TOTAL: 1.2 mg/dL (ref 0.3–1.2)
Indirect Bilirubin: 0.7 mg/dL (ref 0.3–0.9)
Total Protein: 7.4 g/dL (ref 6.0–8.3)

## 2014-06-28 LAB — I-STAT TROPONIN, ED: Troponin i, poc: 0 ng/mL (ref 0.00–0.08)

## 2014-06-28 LAB — LIPASE, BLOOD: Lipase: 44 U/L (ref 11–59)

## 2014-06-28 LAB — PRO B NATRIURETIC PEPTIDE: Pro B Natriuretic peptide (BNP): 8.8 pg/mL (ref 0–125)

## 2014-06-28 IMAGING — CR DG CHEST 1V PORT
1 series · 1 of 1 positions shown · non-contrast
Comparison: [DATE]

CLINICAL DATA: Chest pain

EXAM:
PORTABLE CHEST - 1 VIEW

[AP]
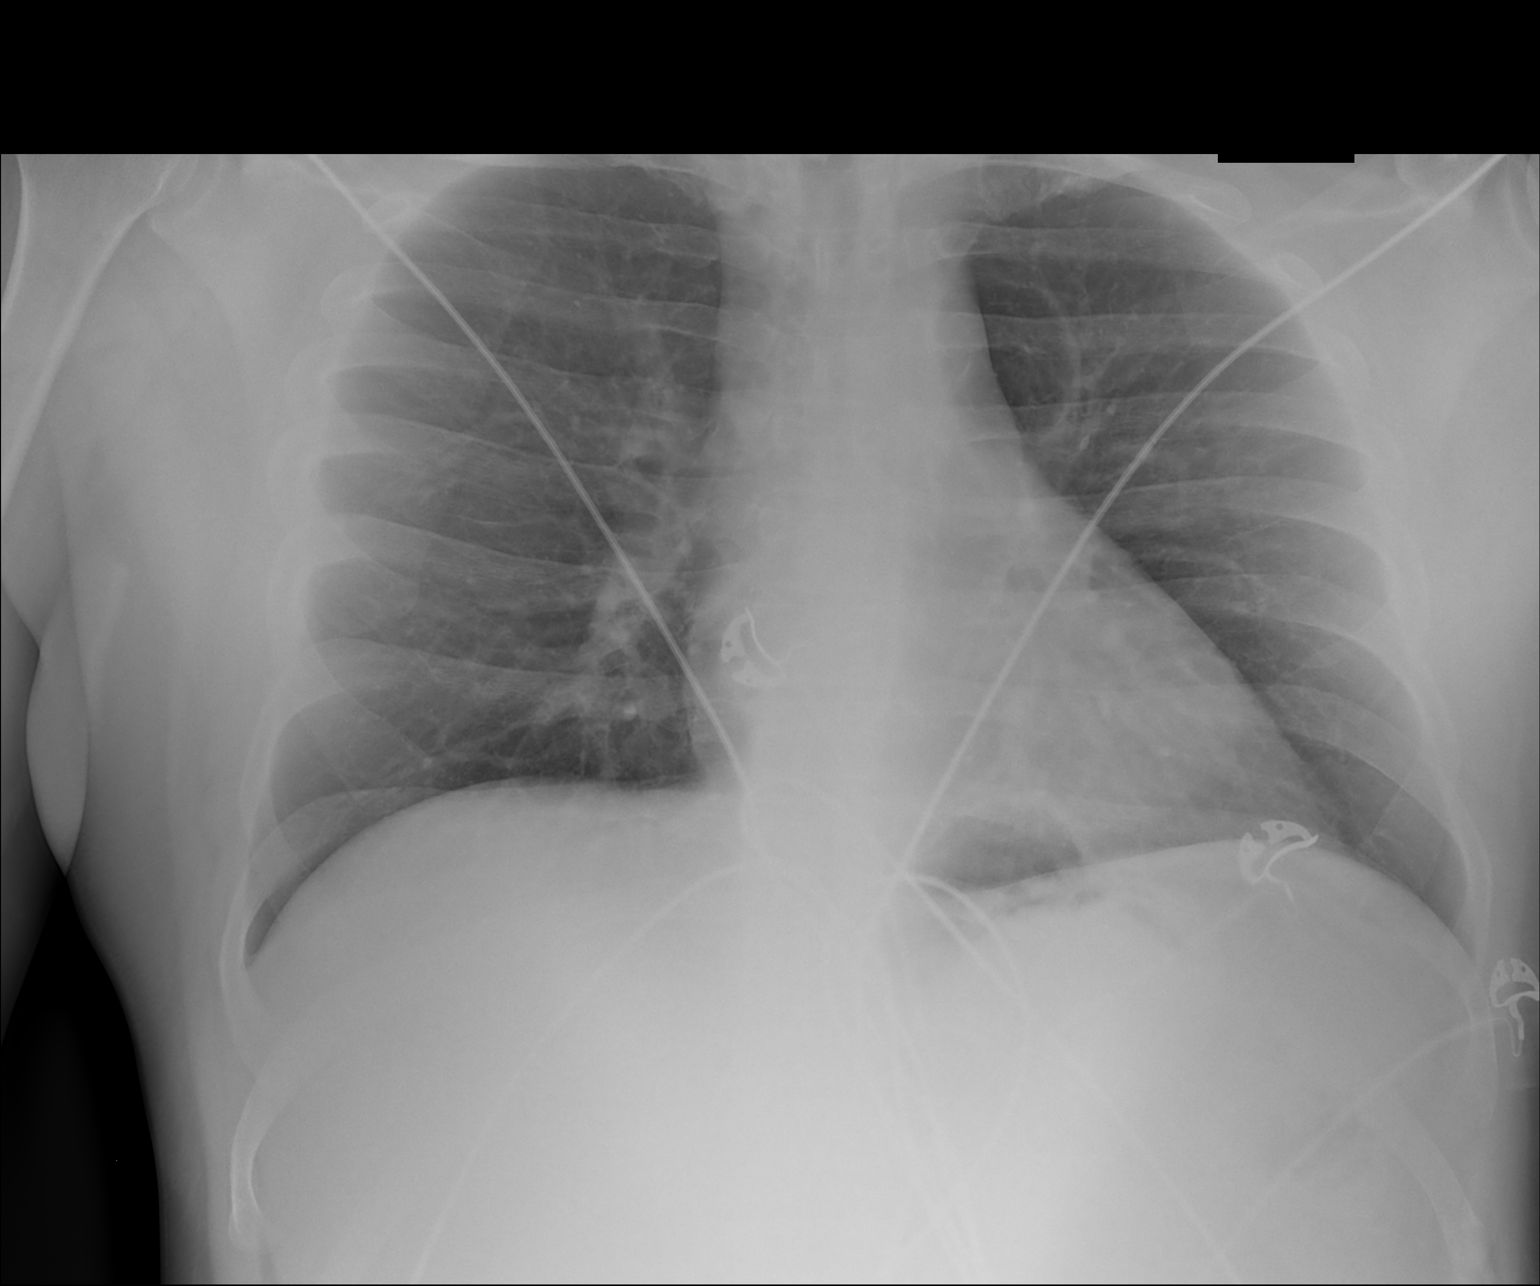

[1 of 1 positions shown; findings below may reference images not displayed]

FINDINGS: Normal heart size and mediastinal contours. No acute infiltrate or
edema. No effusion or pneumothorax. No acute osseous findings.
IMPRESSION: No active disease.

## 2014-06-28 IMAGING — CR DG ABDOMEN 2V
3 series · 3 of 3 positions shown · non-contrast
Comparison: None.

CLINICAL DATA: Chest pain and shortness of breath.  Abdominal pain.

EXAM:
ABDOMEN - 2 VIEW

[w abdomen upright]
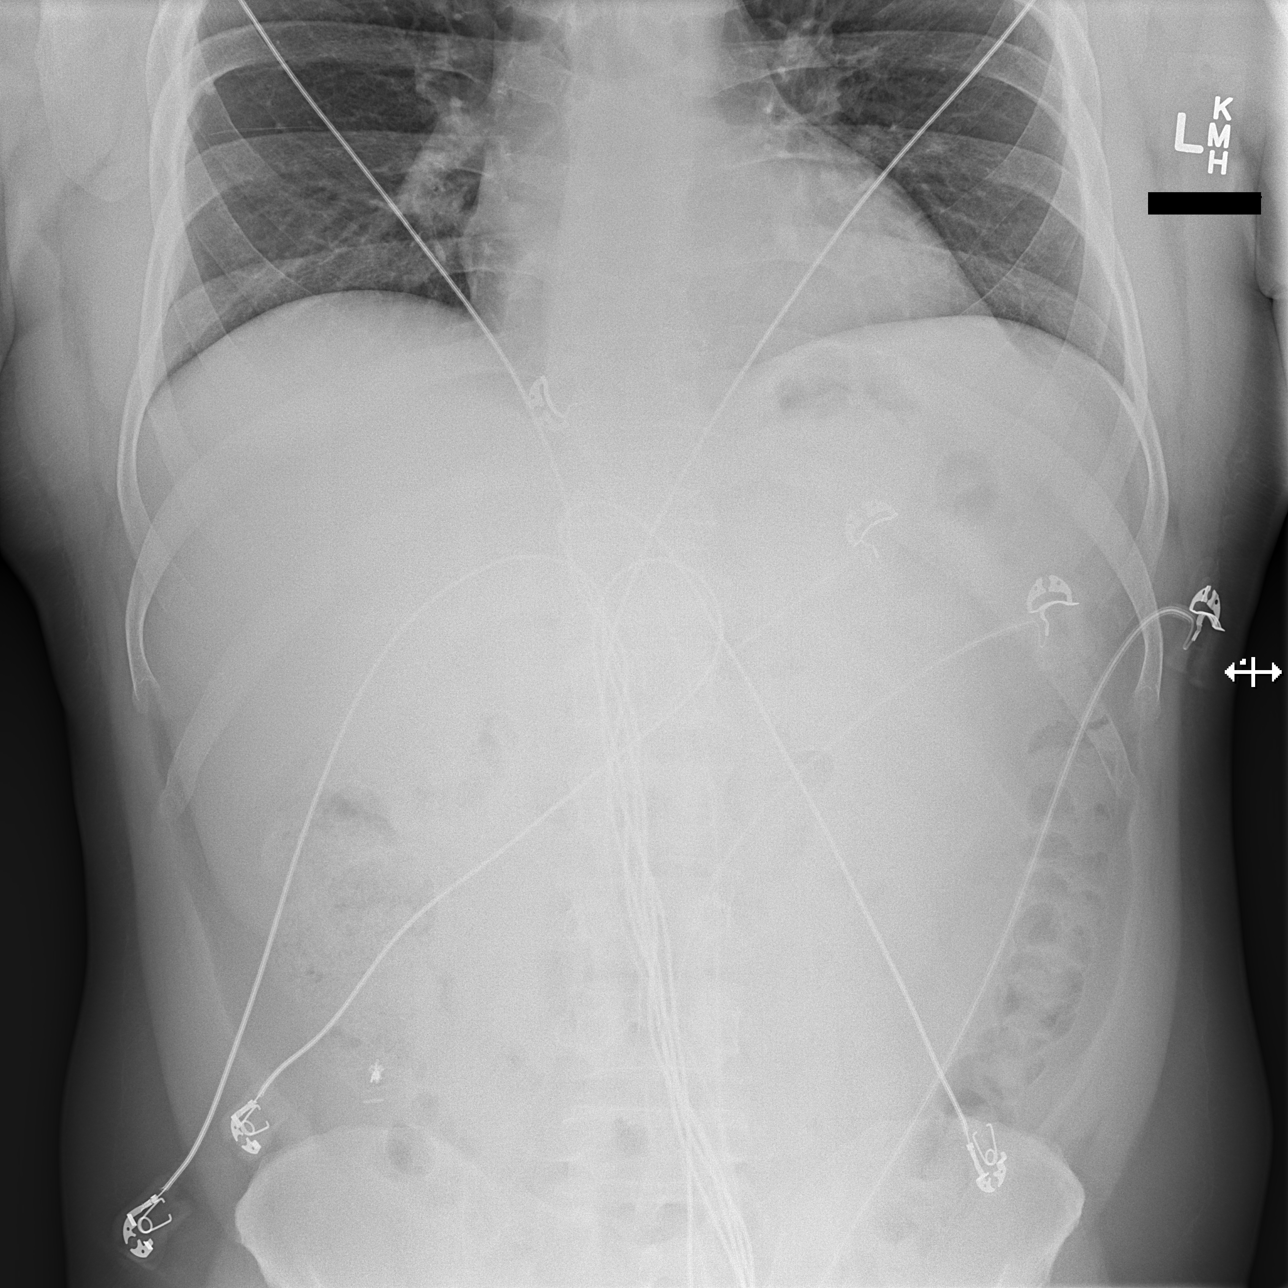

[t abdomen supine (1 of 2)]
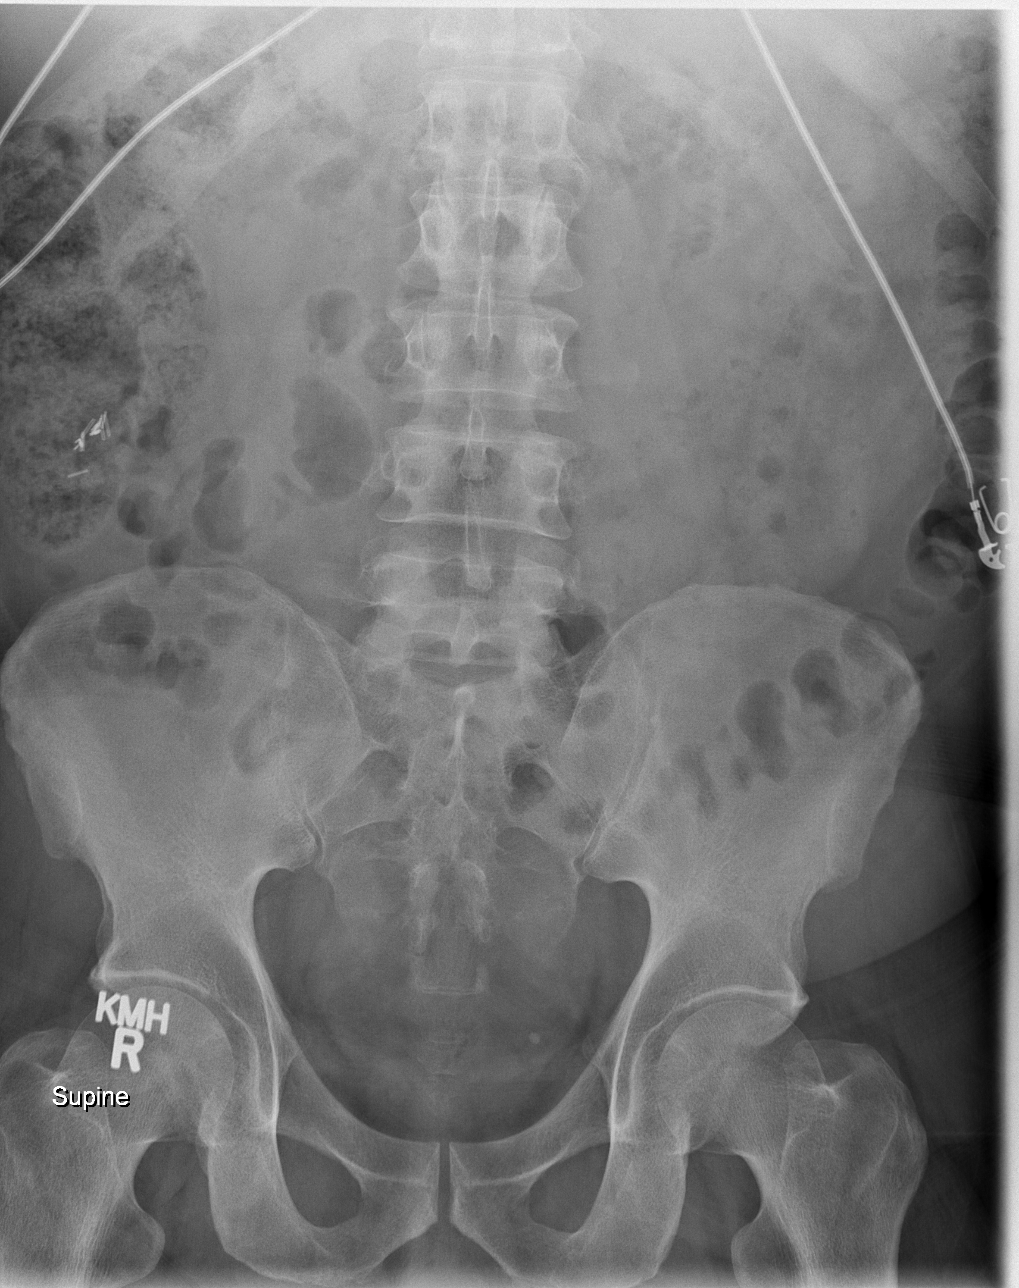

[t abdomen supine (2 of 2)]
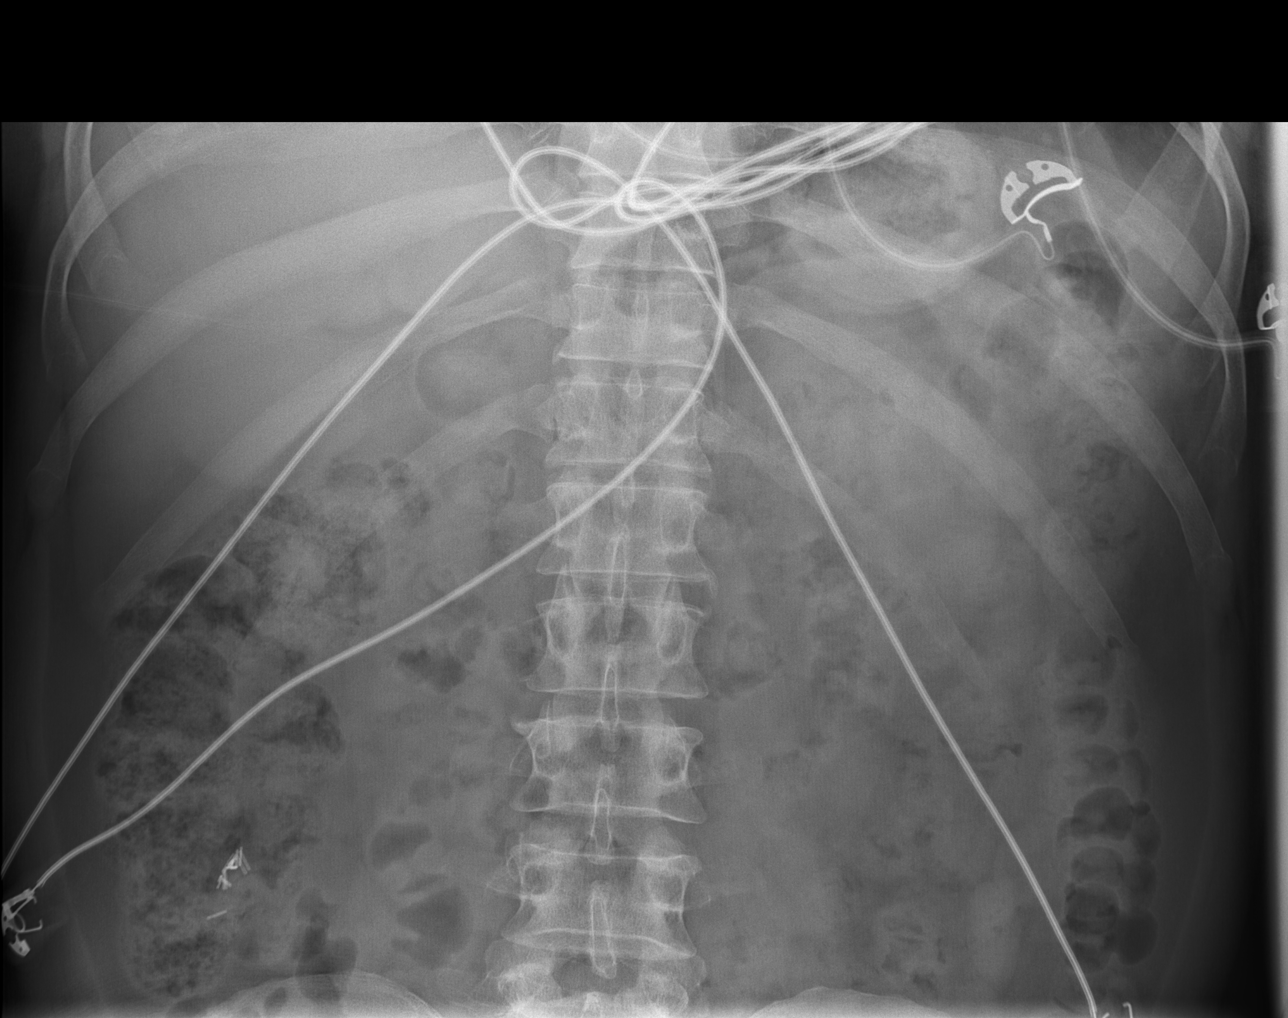

[3 of 3 positions shown; findings below may reference images not displayed]

FINDINGS: The bowel gas pattern is normal. There is no evidence of free air.
No radio-opaque calculi or other significant radiographic
abnormality is seen. There are right lower quadrant surgical clips
suggesting appendectomy.
IMPRESSION: Negative.

## 2014-06-28 IMAGING — US US ABDOMEN COMPLETE
1 series · 13 of 25 positions shown · non-contrast
Comparison: None.

CLINICAL DATA: Chest pain and shortness of breath.  Abdominal pain.

EXAM:
ULTRASOUND ABDOMEN COMPLETE

[Series 1: us abdomen complete · 0.26mm/px · 13 of 53 slices shown]
[im 1/53]
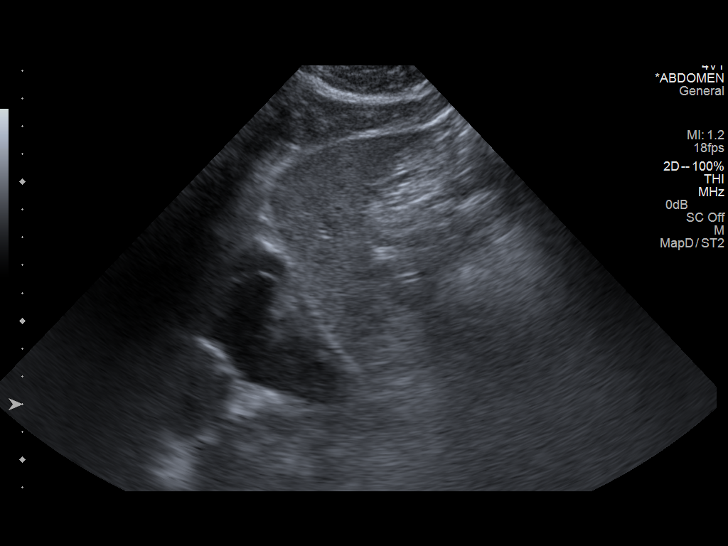
[im 5/53]
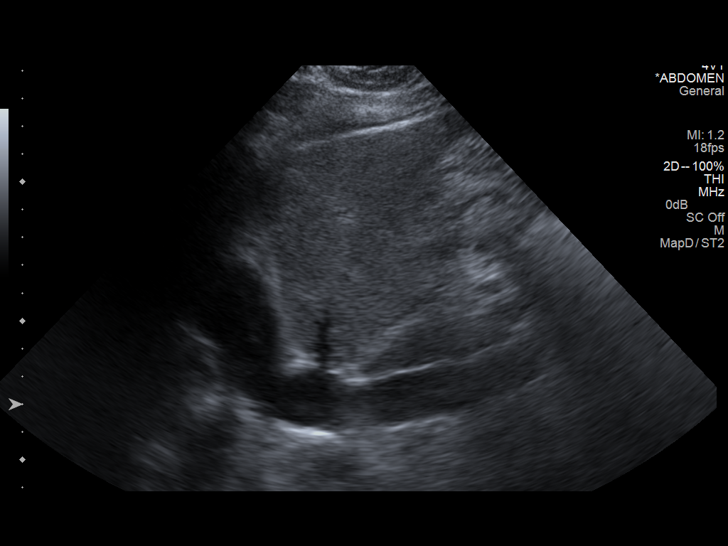
[im 9/53]
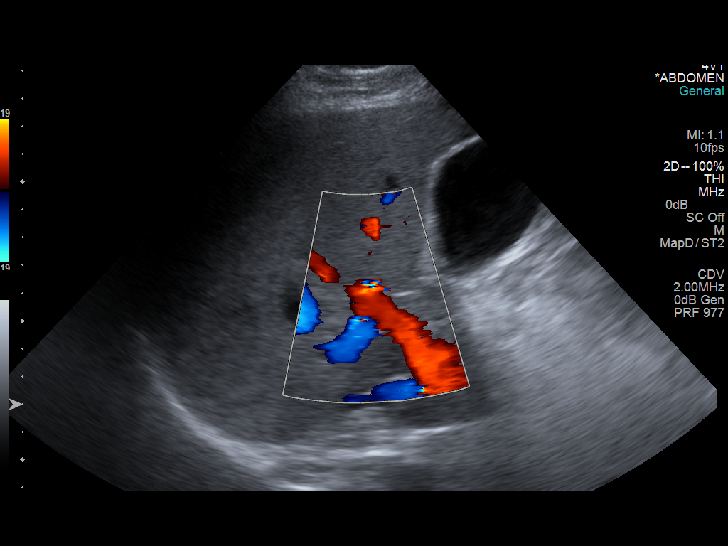
[im 14/53]
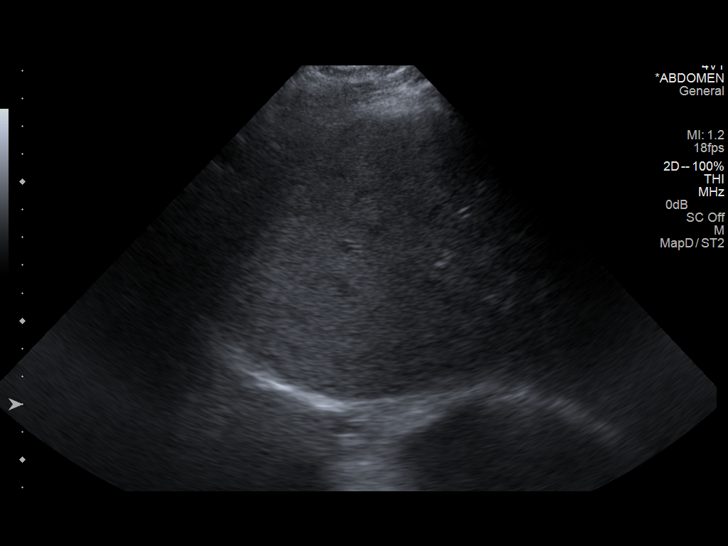
[im 18/53]
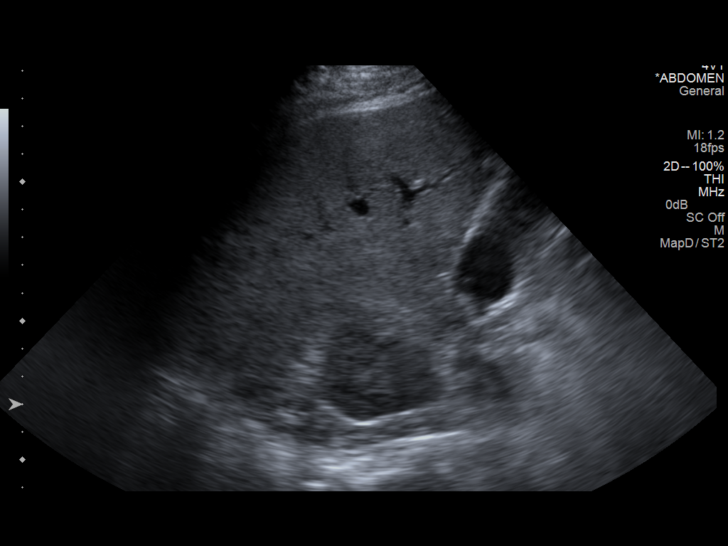
[im 22/53]
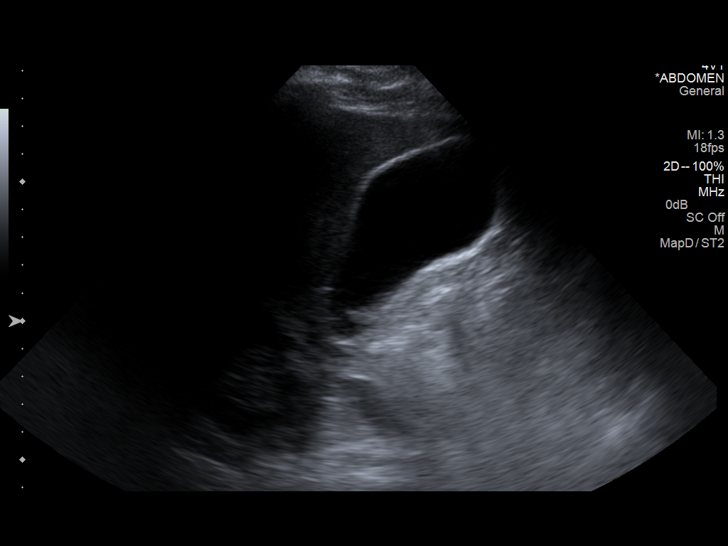
[im 27/53]
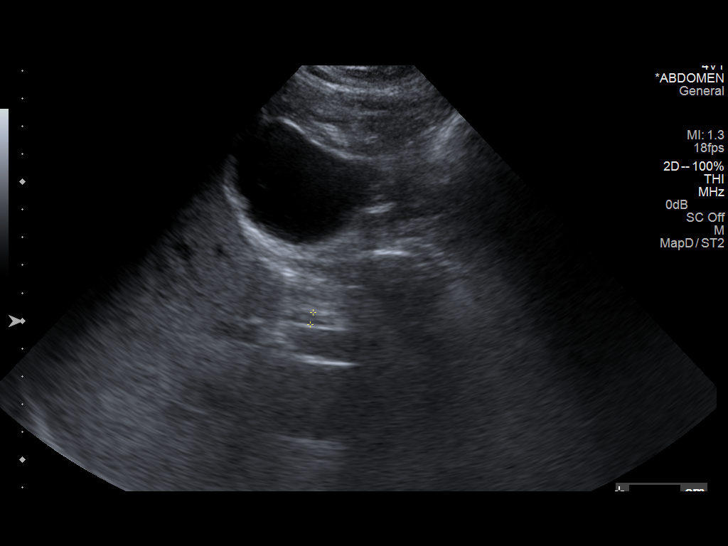
[im 31/53]
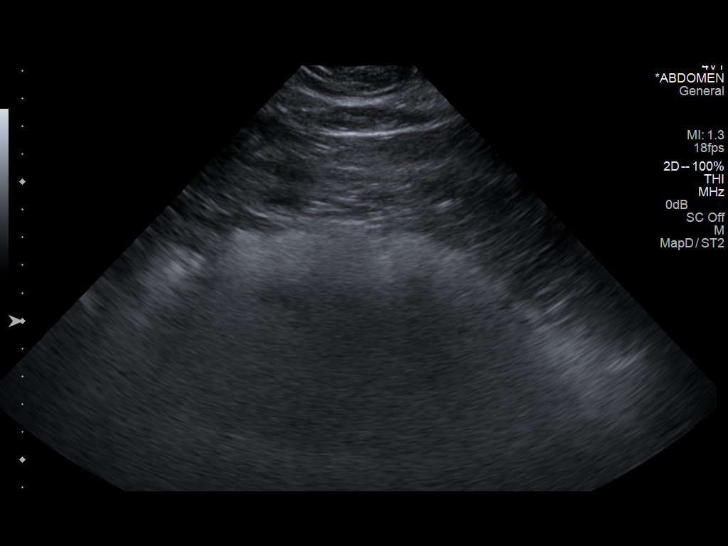
[im 35/53]
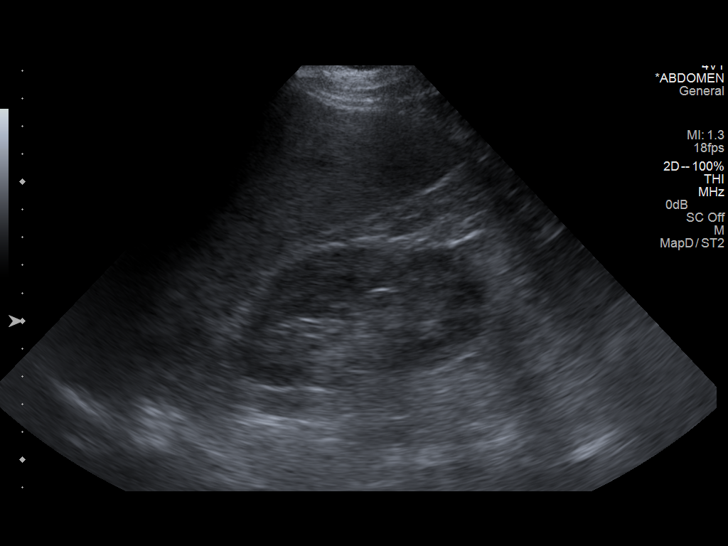
[im 40/53]
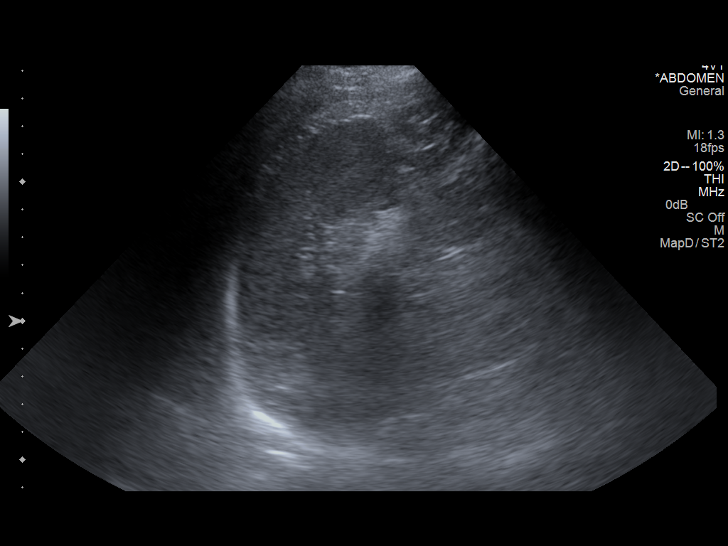
[im 44/53]
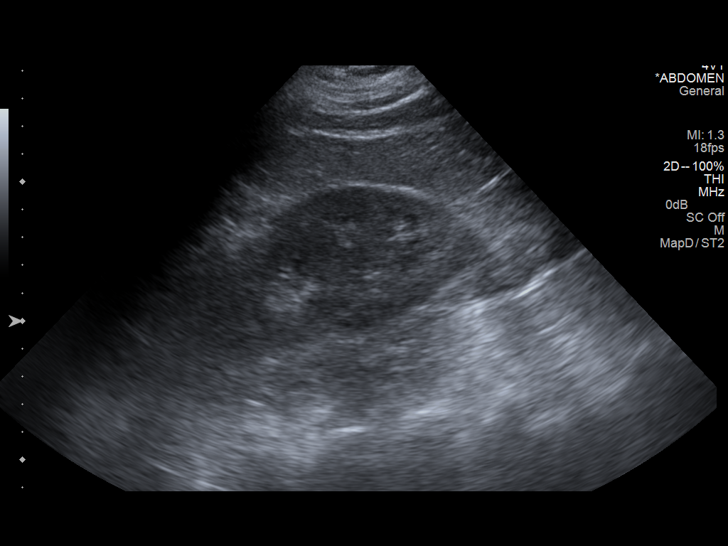
[im 48/53]
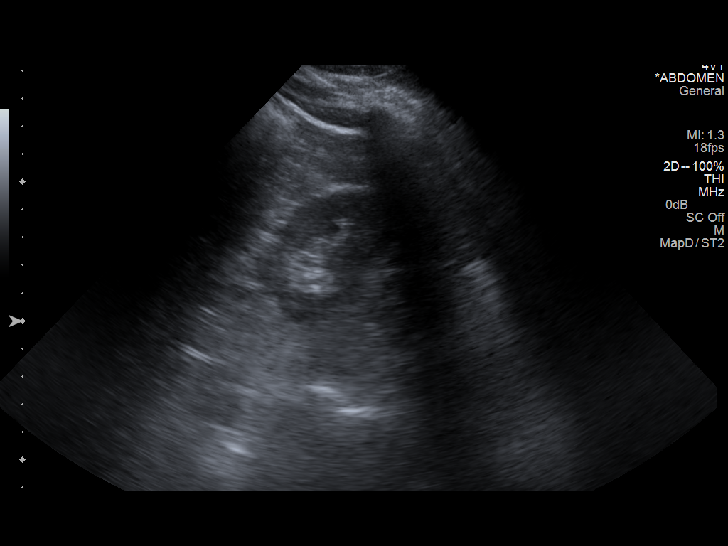
[im 53/53]
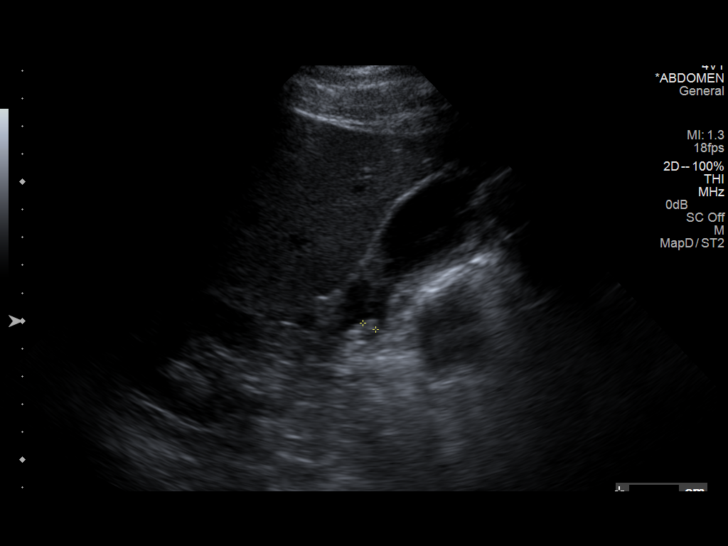

[13 of 25 positions shown; findings below may reference images not displayed]

FINDINGS: Gallbladder:

Multiple small non shadowing stones are present. These appear to be
small-bowel, but it is difficult to ascertain given the location in
the neck of the gallbladder. Wall thickness is within normal limits
at 2.7 mm. There is no sonographic Murphy sign. Patient is more
tender in the mid epigastric area, away from the gallbladder.

Common bile duct:

Diameter: 4.5 mm, within normal limits

Liver:

The gallbladder somewhat echogenic. No focal lesions are present.
Internal architecture is obscured.

IVC:

No abnormality visualized.

Pancreas:

Pancreas is poorly visualized due to overlying bowel gas.

Spleen:

Spleen is of normal size and echotexture, measuring 9.7 cm
maximally.

Right Kidney:

Length: 10.7 cm, within normal limits. Echogenicity within normal
limits. No mass or hydronephrosis visualized.

Left Kidney:

Length: 12.6 cm, within normal limits. Echogenicity within normal
limits. No mass or hydronephrosis visualized.

Abdominal aorta:

No aneurysm visualized.

Other findings:

None.
IMPRESSION: 1. Cholelithiasis without evidence for cholecystitis.
2. Mild fatty infiltration of the liver.
3. No acute or focal lesion to explain the patient's pain.
4. The pancreas is poorly visualized.

## 2014-06-28 MED ORDER — PROMETHAZINE HCL 25 MG/ML IJ SOLN
12.5000 mg | Freq: Once | INTRAMUSCULAR | Status: AC
  Administered 2014-06-28: 12.5 mg via INTRAVENOUS
  Filled 2014-06-28: qty 1

## 2014-06-28 MED ORDER — HYDROCODONE-ACETAMINOPHEN 5-325 MG PO TABS: 1.0000 | ORAL_TABLET | ORAL | Status: DC | PRN

## 2014-06-28 MED ORDER — ONDANSETRON HCL 4 MG/2ML IJ SOLN
4.0000 mg | Freq: Once | INTRAMUSCULAR | Status: AC
  Administered 2014-06-28: 4 mg via INTRAVENOUS
  Filled 2014-06-28: qty 2

## 2014-06-28 MED ORDER — ONDANSETRON 4 MG PO TBDP: ORAL_TABLET | ORAL | Status: DC

## 2014-06-28 NOTE — ED Notes (Signed)
US tech at bedside

## 2014-06-28 NOTE — Discharge Instructions (Signed)
Take Vicodin for severe pain only. No driving or operating heavy machinery while taking vicodin. This medication may cause drowsiness. Take zofran as directed as needed for nausea. Follow up with [facility].  Cholelithiasis Cholelithiasis (also called gallstones) is a form of gallbladder disease in which gallstones form in your gallbladder. The gallbladder is an organ that stores bile made in the liver, which helps digest fats. Gallstones begin as small crystals and slowly grow into stones. Gallstone pain occurs when the gallbladder spasms and a gallstone is blocking the duct. Pain can also occur when a stone passes out of the duct.  RISK FACTORS  Being male.   Having multiple pregnancies. Health care providers sometimes advise removing diseased gallbladders before future pregnancies.   Being obese.  Eating a diet heavy in fried foods and fat.   Being older than 63 years and increasing age.   Prolonged use of medicines containing male hormones.   Having diabetes mellitus.   Rapidly losing weight.   Having a family history of gallstones (heredity).  SYMPTOMS  Nausea.   Vomiting.  Abdominal pain.   Yellowing of the skin (jaundice).   Sudden pain. It may persist from several minutes to several hours.  Fever.   Tenderness to the touch. In some cases, when gallstones do not move into the bile duct, people have no pain or symptoms. These are called "silent" gallstones.  TREATMENT Silent gallstones do not need treatment. In severe cases, emergency surgery may be required. Options for treatment include:  Surgery to remove the gallbladder. This is the most common treatment.  Medicines. These do not always work and may take 6-12 months or more to work.  Shock wave treatment (extracorporeal biliary lithotripsy). In this treatment an ultrasound machine sends shock waves to the gallbladder to break gallstones into smaller pieces that can pass into the  intestines or be dissolved by medicine. HOME CARE INSTRUCTIONS   Only take over-the-counter or prescription medicines for pain, discomfort, or fever as directed by your health care provider.   Follow a low-fat diet until seen again by your health care provider. Fat causes the gallbladder to contract, which can result in pain.   Follow up with your health care provider as directed. Attacks are almost always recurrent and surgery is usually required for permanent treatment.  SEEK IMMEDIATE MEDICAL CARE IF:   Your pain increases and is not controlled by medicines.   You have a fever or persistent symptoms for more than 2-3 days.   You have a fever and your symptoms suddenly get worse.   You have persistent nausea and vomiting.  MAKE SURE YOU:   Understand these instructions.  Will watch your condition.  Will get help right away if you are not doing well or get worse. Document Released: 12/08/2005 Document Revised: 08/14/2013 Document Reviewed: 06/05/2013 [facility] Patient Information 2015 [place], [place]. This information is not intended to replace advice given to you by your health care provider. Make sure you discuss any questions you have with your health care provider.

## 2014-06-28 NOTE — ED Provider Notes (Signed)
Care assumed from [facility], [place]. Pt with epigastric abdominal pain. LFTs elevated. Abdominal US pending.  No hx of liver ds or alcohol abuse. 8:17 AM Abdominal US showing cholelithiasis without evidence for cholecystitis, mild fatty infiltration of the liver. On evaluation the patient, he is resting comfortably on examining. He states his pain is greatly improved from when he first arrived. Abdomen is soft with normal bowel sounds, tenderness in midepigastric and right upper quadrant, no peritoneal signs, negative Murphy's sign. Patient reports the tenderness is not nearly as bad as it was when he first arrived. He is comfortable to be discharged home. He'll be discharged home with Vicodin and Zofran. Followup with surgery outpatient. Return precautions given. Patient states understanding of plan and is agreeable.  US Abdomen Complete  06/28/2014   CLINICAL DATA:  Chest pain and shortness of breath.  Abdominal pain.  EXAM: ULTRASOUND ABDOMEN COMPLETE  COMPARISON:  None.  FINDINGS: Gallbladder:  Multiple small non shadowing stones are present. These appear to be small-bowel, but it is difficult to ascertain given the location in the neck of the gallbladder. Wall thickness is within normal limits at 2.7 mm. There is no sonographic Murphy sign. Patient is more tender in the mid epigastric area, away from the gallbladder.  Common bile duct:  Diameter: 4.5 mm, within normal limits  Liver:  The gallbladder somewhat echogenic. No focal lesions are present. Internal architecture is obscured.  IVC:  No abnormality visualized.  Pancreas:  Pancreas is poorly visualized due to overlying bowel gas.  Spleen:  Spleen is of normal size and echotexture, measuring 9.7 cm maximally.  Right Kidney:  Length: 10.7 cm, within normal limits. Echogenicity within normal limits. No mass or hydronephrosis visualized.  Left Kidney:  Length: 12.6 cm, within normal limits. Echogenicity within normal limits. No mass or  hydronephrosis visualized.  Abdominal aorta:  No aneurysm visualized.  Other findings:  None.  IMPRESSION: 1. Cholelithiasis without evidence for cholecystitis. 2. Mild fatty infiltration of the liver. 3. No acute or focal lesion to explain the patient's pain. 4. The pancreas is poorly visualized.   Electronically Signed   By: Lawrence Santiago M.D.   On: 06/28/2014 08:03   Dg Chest Port 1 View  06/28/2014   CLINICAL DATA:  Chest pain  EXAM: PORTABLE CHEST - 1 VIEW  COMPARISON:  06/10/2010  FINDINGS: Normal heart size and mediastinal contours. No acute infiltrate or edema. No effusion or pneumothorax. No acute osseous findings.  IMPRESSION: No active disease.   Electronically Signed   By: Jorje Guild M.D.   On: 06/28/2014 04:47   Dg Abd 2 Views  06/28/2014   CLINICAL DATA:  Chest pain and shortness of breath.  Abdominal pain.  EXAM: ABDOMEN - 2 VIEW  COMPARISON:  None.  FINDINGS: The bowel gas pattern is normal. There is no evidence of free air. No radio-opaque calculi or other significant radiographic abnormality is seen. There are right lower quadrant surgical clips suggesting appendectomy.  IMPRESSION: Negative.   Electronically Signed   By: Jorje Guild M.D.   On: 06/28/2014 05:12   Illene Labrador, PA-C 06/28/14 [phone]

## 2014-06-28 NOTE — ED Notes (Signed)
Patient moving around on the stretcher, moving arms, legs Patient not able to hold still in order for PIV to be established and labs to be obtained Will make Dr. Kathrynn Humble aware

## 2014-06-28 NOTE — ED Provider Notes (Signed)
CSN: [id number]     Arrival date & time 06/28/14  1751 History   First MD Initiated Contact with Patient 06/28/14 0356     Chief Complaint  Patient presents with  . Chest Pain  . Shortness of Breath  . Abdominal Pain     (Consider location/radiation/quality/duration/timing/severity/associated sxs/prior Treatment) HPI Rickey Cruz is a 53 y.o. male who presents emergency department complaining of epigastric abdominal pain, nausea, vomiting. Patient states his symptoms began tonight. States pain is sharp and burning. States is having associated nausea and has vomited several times here upon arriving to emergency department. Patient states he feels better after he vomits. Nothing else makes his symptoms improved. Nothing making his symptoms worse. He did not try any medications prior to coming in. He denies any prior abdominal similar pain. He denies any fever, chills, malaise. He denies any blood in his stool or his emesis. He denies any chest pain, however states he did have some tingling in left arm. Past Medical History  Diagnosis Date  . Asthma     seasonal, with allergies  . Hyperlipidemia   . Hypertension   . 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 Psoriasis     [name]  . History of kidney stones   . Colon polyp 10/27/2011  . Allergy     Flonase, Patanol, Zyrtec  . Arthritis     psoriasis   Past Surgical History  Procedure Laterality Date  . Appendectomy    . Colonoscopy  10/27/2011    single polyp. Repeat 5 years.  . Polypectomy    . Tonsillectomy    . Septoplasty     Family History  Problem Relation Age of Onset  . Colon cancer Neg Hx   . Esophageal cancer Neg Hx   . Stomach cancer Neg Hx    History  Substance Use Topics  . Smoking status: Never Smoker   . Smokeless tobacco: Never Used  . Alcohol Use: No    Review of Systems  Constitutional: Negative for fever and chills.  Respiratory: Negative for cough, chest tightness and  shortness of breath.   Cardiovascular: Negative for chest pain, palpitations and leg swelling.  Gastrointestinal: Positive for nausea, vomiting, abdominal pain and abdominal distention. Negative for diarrhea, constipation and blood in stool.  Genitourinary: Negative for dysuria, urgency, frequency and hematuria.  Musculoskeletal: Negative for arthralgias, myalgias, neck pain and neck stiffness.  Skin: Negative for rash.  Allergic/Immunologic: Negative for immunocompromised state.  Neurological: Negative for dizziness, weakness, light-headedness, numbness and headaches.  All other systems reviewed and are negative.     Allergies  Review of patient's allergies indicates no known allergies.  Home Medications   Prior to Admission medications   Medication Sig Start Date End Date Taking? Authorizing Provider  amLODipine (NORVASC) 5 MG tablet Take 1 tablet (5 mg total) by mouth daily. 06/27/14  Yes [name], MD  clindamycin (CLINDAGEL) 1 % gel Apply 1 application topically 2 (two) times daily. Apply to face   Yes Historical Provider, MD  clobetasol (OLUX) 0.05 % topical foam Apply 1 application topically 2 (two) times daily.   Yes Historical Provider, MD  desonide (DESOWEN) 0.05 % cream Apply topically 2 (two) times daily. 06/27/14  Yes [name], MD  hydrochlorothiazide (HYDRODIURIL) 12.5 MG tablet Take 1 tablet (12.5 mg total) by mouth daily. 06/27/14  Yes [name], MD  rosuvastatin (CRESTOR) 10 MG tablet Take 1 tablet (10 mg  total) by mouth daily. 06/27/14  Yes [name], MD  fluticasone (FLONASE) 50 MCG/ACT nasal spray Place 2 sprays into both nostrils daily. 06/27/14   [name], MD   BP 122/73  Pulse 70  Resp 10  SpO2 98% Physical Exam  Nursing note and vitals reviewed. Constitutional: He is oriented to person, place, and time. He appears well-developed and well-nourished. No distress.  HENT:  Head: Normocephalic and atraumatic.  Eyes: Conjunctivae are normal.   Neck: Neck supple.  Cardiovascular: Normal rate, regular rhythm and normal heart sounds.   Pulmonary/Chest: Effort normal. No respiratory distress. He has no wheezes. He has no rales.  Abdominal: Soft. Bowel sounds are normal. He exhibits no distension. There is tenderness. There is no rebound.  Right upper quadrant, epigastric, left upper quadrant tenderness.  Musculoskeletal: He exhibits no edema.  Neurological: He is alert and oriented to person, place, and time.  Skin: Skin is warm and dry.    ED Course  Procedures (including critical care time) Labs Review Labs Reviewed  CBC - Abnormal; Notable for the following:    WBC 12.6 (*)    MCHC 36.3 (*)    All other components within normal limits  BASIC METABOLIC PANEL - Abnormal; Notable for the following:    Glucose, Bld 170 (*)    GFR calc non Af Amer 76 (*)    GFR calc Af Amer 88 (*)    All other components within normal limits  HEPATIC FUNCTION PANEL - Abnormal; Notable for the following:    AST 185 (*)    ALT 116 (*)    All other components within normal limits  DIFFERENTIAL - Abnormal; Notable for the following:    Neutrophils Relative % 78 (*)    Neutro Abs 9.8 (*)    Monocytes Absolute 1.1 (*)    All other components within normal limits  PRO B NATRIURETIC PEPTIDE  LIPASE, BLOOD  DIFFERENTIAL  I-STAT TROPOININ, ED    Imaging Review Dg Chest Port 1 View  06/28/2014   CLINICAL DATA:  Chest pain  EXAM: PORTABLE CHEST - 1 VIEW  COMPARISON:  06/10/2010  FINDINGS: Normal heart size and mediastinal contours. No acute infiltrate or edema. No effusion or pneumothorax. No acute osseous findings.  IMPRESSION: No active disease.   Electronically Signed   By: Jorje Guild M.D.   On: 06/28/2014 04:47   Dg Abd 2 Views  06/28/2014   CLINICAL DATA:  Chest pain and shortness of breath.  Abdominal pain.  EXAM: ABDOMEN - 2 VIEW  COMPARISON:  None.  FINDINGS: The bowel gas pattern is normal. There is no evidence of free air. No  radio-opaque calculi or other significant radiographic abnormality is seen. There are right lower quadrant surgical clips suggesting appendectomy.  IMPRESSION: Negative.   Electronically Signed   By: Jorje Guild M.D.   On: 06/28/2014 05:12     EKG Interpretation   Date/Time:  Saturday June 28 2014 03:41:33 EDT Ventricular Rate:  69 PR Interval:  171 QRS Duration: 96 QT Interval:  384 QTC Calculation: 411 R Axis:   -8 Text Interpretation:  Sinus rhythm Probable left atrial enlargement ST  elev, probable normal early repol pattern No STEMI Confirmed by NANAVATI,  MD, ANKIT [phone]) on 06/28/2014 3:43:53 AM      MDM   Final diagnoses:  Cholelithiasis without cholecystitis    Patient with right upper quadrant left upper quadrant abdominal pain, nausea, vomiting. No changes in his bowels. No fever.  He feels better each time he vomits. Will get labs, x-ray of the abdomen and chest.  5:31 AM  X-rays negative. Patient does have elevated white blood count. LFTs are elevated as well. Will get ultrasound for further evaluation.  Signed out with PA Gwenlyn Perking pending [place]. Pt is feeling better.   Renold Genta, PA-C 06/29/14 [phone]

## 2014-06-28 NOTE — ED Notes (Signed)
PA at bedside.

## 2014-06-28 NOTE — ED Notes (Signed)
Patient reports relief of epigastric pain after vomiting Patient medicated with Zofran, see MAR

## 2014-06-28 NOTE — ED Notes (Signed)
Patient reports that nausea has subsided s/p admin of Phenergan, see MAR Patient asking to go home Patient informed of labs and subsequent result of order for US--v/u

## 2014-06-28 NOTE — ED Notes (Signed)
Patient transported to X-ray 

## 2014-06-28 NOTE — ED Notes (Signed)
Pt ambulated to bathroom without assistance 

## 2014-06-28 NOTE — ED Notes (Signed)
Patient arrives to ED with c/o epigastric pain, RUQ/LUQ abdominal pain that started tonight Patient describes the pain as "burning" Patient states "I don't know if it's food poisoning or not"

## 2014-06-30 ENCOUNTER — Ambulatory Visit (INDEPENDENT_AMBULATORY_CARE_PROVIDER_SITE_OTHER): Payer: Self-pay | Admitting: Internal Medicine

## 2014-06-30 ENCOUNTER — Encounter: Payer: Self-pay | Admitting: Internal Medicine

## 2014-06-30 VITALS — BP 118/82 | HR 79 | Temp 98.4°F | Ht 66.0 in | Wt 187.8 lb

## 2014-06-30 DIAGNOSIS — K8020 Calculus of gallbladder without cholecystitis without obstruction: Secondary | ICD-10-CM

## 2014-06-30 LAB — GC/CHLAMYDIA PROBE AMP
CT Probe RNA: NEGATIVE
GC PROBE AMP APTIMA: NEGATIVE

## 2014-06-30 NOTE — Assessment & Plan Note (Signed)
Recently diagnosed with cholelithiasis in the setting of what seemed to be a biliary colic. Already has an appointment to see a Psychologist, sport and exercise. In the meantime I recommend him to call or go to the ER if symptoms resurface, we'll recheck LFTs and amylase and lipase.

## 2014-06-30 NOTE — Patient Instructions (Signed)
Get your blood work before you leave   Pleas discuss with Dr Tamala Julian the labs from your recent physical

## 2014-06-30 NOTE — Progress Notes (Signed)
Subjective:    Patient ID: Rickey Cruz, male    DOB: 03/18/1961, 53 y.o.   MRN: [id number]  DOS:  06/30/2014 Type of visit - description: ER Followup  History: Approximately 06/28/2014 he developed epigastric pain described as colicky, not burning. By 3 AM the pain was severe, went to the ER, at the ER He had an episode of nausea and he vomited, after that he felt somehow better. At the ER, LFTs were slightly elevated and so was the white count. Ultrasound show gallbladder stones without Cholecystitis Here for followup, was recommended hydrocodone and zofran  but decided not to take it because so far he has been asymptomatic  ROS Denies fever or chills No diarrhea or blood in the stools No GERD symptoms No cough or sputum production No gross hematuria  Past Medical History  Diagnosis Date  . Asthma     seasonal, with allergies  . Hyperlipidemia   . Hypertension   . 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 Psoriasis     [name]  . History of kidney stones   . Colon polyp 10/27/2011  . Allergy     Flonase, Patanol, Zyrtec  . Arthritis     psoriasis    Past Surgical History  Procedure Laterality Date  . Appendectomy    . Colonoscopy  10/27/2011    single polyp. Repeat 5 years.  . Polypectomy    . Tonsillectomy    . Septoplasty      History   Social History  . Marital Status: Single    Spouse Name: N/A    Number of Children: 1  . Years of Education: N/A   Occupational History  . TEACHER     [employer]   Social History Main Topics  . Smoking status: Never Smoker   . Smokeless tobacco: Never Used  . Alcohol Use: No  . Drug Use: No  . Sexual Activity: Yes     Comment: bisexual   Other Topics Concern  . Not on file   Social History Narrative   Original from [place]    Marital status: separated x 1 year after 13 years of marriage; not dating.   Children:  1 child (15).   Lives: with roommate; sees son  daily   Employment: Pharmacist, hospital; transition from Administration to teaching again in 2015; moved from [place], [place] in 2015; [employer] Spanish teacher               Family History  Problem Relation Age of Onset  . Colon cancer Neg Hx   . Esophageal cancer Neg Hx   . Stomach cancer Neg Hx   . Prostate cancer Neg Hx   . Diabetes Neg Hx   . CAD Neg Hx        Medication List       This list is accurate as of: 06/30/14  5:43 PM.  Always use your most recent med list.               amLODipine 5 MG tablet  Commonly known as:  NORVASC  Take 1 tablet (5 mg total) by mouth daily.     clindamycin 1 % gel  Commonly known as:  CLINDAGEL  Apply 1 application topically 2 (two) times daily. Apply to face     clobetasol 0.05 % topical foam  Commonly known as:  OLUX  Apply 1 application topically 2 (two) times daily.  desonide 0.05 % cream  Commonly known as:  DESOWEN  Apply topically 2 (two) times daily.     fluocinonide cream 0.05 %  Commonly known as:  LIDEX  Apply 1 application topically 2 (two) times daily.     fluocinonide 0.05 % external solution  Commonly known as:  LIDEX  Apply 1 application topically 2 (two) times daily.     fluticasone 50 MCG/ACT nasal spray  Commonly known as:  FLONASE  Place 2 sprays into both nostrils daily.     hydrochlorothiazide 12.5 MG tablet  Commonly known as:  HYDRODIURIL  Take 1 tablet (12.5 mg total) by mouth daily.     HYDROcodone-acetaminophen 5-325 MG per tablet  Commonly known as:  NORCO/VICODIN  Take 1-2 tablets by mouth every 4 (four) hours as needed.     ketoconazole 2 % shampoo  Commonly known as:  NIZORAL  Apply 1 application topically 2 (two) times a week.     ondansetron 4 MG disintegrating tablet  Commonly known as:  ZOFRAN ODT  4mg  ODT q4 hours prn nausea/vomit     rosuvastatin 10 MG tablet  Commonly known as:  CRESTOR  Take 1 tablet (10 mg total) by mouth daily.           Objective:    Physical Exam BP 118/82  Pulse 79  Temp(Src) 98.4 F (36.9 C) (Oral)  Ht 5\' 6"  (1.676 m)  Wt 187 lb 12.8 oz (85.186 kg)  BMI 30.33 kg/m2  SpO2 95% General -- alert, well-developed, NAD.    HEENT-- Not pale or jaundice Lungs -- normal respiratory effort, no intercostal retractions, no accessory muscle use, and normal breath sounds.  Heart-- normal rate, regular rhythm, no murmur.  Abdomen-- Not distended, good bowel sounds,soft, non-tender. No rebound or rigidity.   Psych-- Cognition and judgment appear intact. Cooperative with normal attention span and concentration. No anxious or depressed appearing.        Assessment & Plan:   Was recently seen by Dr. Tamala Julian for a complete physical, some of the results  are abnormal, I recommend him to contact Dr. Tamala Julian to discuss results

## 2014-06-30 NOTE — Progress Notes (Signed)
Pre visit review using our clinic review tool, if applicable. No additional management support is needed unless otherwise documented below in the visit note. 

## 2014-07-01 ENCOUNTER — Telehealth: Payer: Self-pay | Admitting: *Deleted

## 2014-07-01 ENCOUNTER — Encounter: Payer: Self-pay | Admitting: *Deleted

## 2014-07-01 LAB — HEPATIC FUNCTION PANEL
ALT: 310 U/L — ABNORMAL HIGH (ref 0–53)
AST: 103 U/L — ABNORMAL HIGH (ref 0–37)
Albumin: 4 g/dL (ref 3.5–5.2)
Alkaline Phosphatase: 96 U/L (ref 39–117)
Bilirubin, Direct: 0.1 mg/dL (ref 0.0–0.3)
TOTAL PROTEIN: 7.1 g/dL (ref 6.0–8.3)
Total Bilirubin: 0.6 mg/dL (ref 0.2–1.2)

## 2014-07-01 LAB — LIPASE: Lipase: 37 U/L (ref 11.0–59.0)

## 2014-07-01 LAB — AMYLASE: Amylase: 92 U/L (ref 27–131)

## 2014-07-01 NOTE — Progress Notes (Signed)
Letter sent to patient wit all Dr. Ethel Rana instructions.   Called pt lmovm for him to return my call.

## 2014-07-01 NOTE — Progress Notes (Signed)
Spoke with patient and made aware of Dr. Ethel Rana recommendations.

## 2014-07-01 NOTE — Telephone Encounter (Signed)
Left message on voice mail for the patient to return my call

## 2014-07-01 NOTE — Telephone Encounter (Signed)
Message copied by Chilton Greathouse on Tue Jul 01, 2014  2:06 PM ------      Message from: Kathlene November E      Created: Tue Jul 01, 2014 12:41 PM       LFTs continue to be elevated, AP normal; LFTs were normal prior to the admission to the hospital with gallbladder stones.      He is now asymptomatic.      Plan: Hold Crestor      See surgery ASAP      Call anytime is nausea, vomiting or fever       Needs OV in 3 weeks ------

## 2014-07-02 NOTE — ED Provider Notes (Signed)
Medical screening examination/treatment/procedure(s) were performed by non-physician practitioner and as supervising physician I was immediately available for consultation/collaboration.   EKG Interpretation   Date/Time:  Saturday June 28 2014 03:41:33 EDT Ventricular Rate:  69 PR Interval:  171 QRS Duration: 96 QT Interval:  384 QTC Calculation: 411 R Axis:   -8 Text Interpretation:  Sinus rhythm Probable left atrial enlargement ST  elev, probable normal early repol pattern No STEMI Confirmed by Kathrynn Humble,  MD, Arra Connaughton ([id number]) on 06/28/2014 3:43:53 AM       Varney Biles, MD 07/02/14 8118

## 2014-07-02 NOTE — ED Provider Notes (Signed)
Medical screening examination/treatment/procedure(s) were performed by non-physician practitioner and as supervising physician I was immediately available for consultation/collaboration.   EKG Interpretation   Date/Time:  Saturday June 28 2014 03:41:33 EDT Ventricular Rate:  69 PR Interval:  171 QRS Duration: 96 QT Interval:  384 QTC Calculation: 411 R Axis:   -8 Text Interpretation:  Sinus rhythm Probable left atrial enlargement ST  elev, probable normal early repol pattern No STEMI Confirmed by Kathrynn Humble,  MD, Adia Crammer ([id number]) on 06/28/2014 3:43:53 AM       Varney Biles, MD 07/02/14 0401

## 2014-07-03 ENCOUNTER — Encounter: Payer: Self-pay | Admitting: Radiology

## 2014-07-03 ENCOUNTER — Encounter (INDEPENDENT_AMBULATORY_CARE_PROVIDER_SITE_OTHER): Payer: Self-pay | Admitting: General Surgery

## 2014-07-03 ENCOUNTER — Ambulatory Visit (INDEPENDENT_AMBULATORY_CARE_PROVIDER_SITE_OTHER): Payer: BC Managed Care – HMO | Admitting: General Surgery

## 2014-07-03 VITALS — BP 126/74 | HR 56 | Ht 66.0 in | Wt 181.0 lb

## 2014-07-03 DIAGNOSIS — K8020 Calculus of gallbladder without cholecystitis without obstruction: Secondary | ICD-10-CM

## 2014-07-03 NOTE — Patient Instructions (Signed)
You have gallstones, and therefore you have a  diseased gallbladder.  You had a recent attack of severe pain and vomiting which was almost certainly a gallbladder attack  You will have recurrent problems in the future.  You will be scheduled for elective laparoscopic cholecystectomy with cholangiogram, possible open.    Laparoscopic Cholecystectomy Laparoscopic cholecystectomy is surgery to remove the gallbladder. The gallbladder is located in the upper right part of the abdomen, behind the liver. It is a storage sac for bile produced in the liver. Bile aids in the digestion and absorption of fats. Cholecystectomy is often done for inflammation of the gallbladder (cholecystitis). This condition is usually caused by a buildup of gallstones (cholelithiasis) in your gallbladder. Gallstones can block the flow of bile, resulting in inflammation and pain. In severe cases, emergency surgery may be required. When emergency surgery is not required, you will have time to prepare for the procedure. Laparoscopic surgery is an alternative to open surgery. Laparoscopic surgery has a shorter recovery time. Your common bile duct may also need to be examined during the procedure. If stones are found in the common bile duct, they may be removed. LET [facility] [employer] CARE PROVIDER KNOW ABOUT:  Any allergies you have.  All medicines you are taking, including vitamins, herbs, eye drops, creams, and over-the-counter medicines.  Previous problems you or members of your family have had with the use of anesthetics.  Any blood disorders you have.  Previous surgeries you have had.  Medical conditions you have. RISKS AND COMPLICATIONS Generally, this is a safe procedure. However, as with any procedure, complications can occur. Possible complications include:  Infection.  Damage to the common bile duct, nerves, arteries, veins, or other internal organs such as the stomach, liver, or intestines.  Bleeding.  A stone  may remain in the common bile duct.  A bile leak from the cyst duct that is clipped when your gallbladder is removed.  The need to convert to open surgery, which requires a larger incision in the abdomen. This may be necessary if your surgeon thinks it is not safe to continue with a laparoscopic procedure. BEFORE THE PROCEDURE  Ask your health care provider about changing or stopping any regular medicines. You will need to stop taking aspirin or blood thinners at least 5 days prior to surgery.  Do not eat or drink anything after midnight the night before surgery.  Let your health care provider know if you develop a cold or other infectious problem before surgery. PROCEDURE   You will be given medicine to make you sleep through the procedure (general anesthetic). A breathing tube will be placed in your mouth.  When you are asleep, your surgeon will make several small cuts (incisions) in your abdomen.  A thin, lighted tube with a tiny camera on the end (laparoscope) is inserted through one of the small incisions. The camera on the laparoscope sends a picture to a TV screen in the operating room. This gives the surgeon a good view inside your abdomen.  A gas will be pumped into your abdomen. This expands your abdomen so that the surgeon has more room to perform the surgery.  Other tools needed for the procedure are inserted through the other incisions. The gallbladder is removed through one of the incisions.  After the removal of your gallbladder, the incisions will be closed with stitches, staples, or skin glue. AFTER THE PROCEDURE  You will be taken to a recovery area where your progress will be checked  often.  You may be allowed to go home the same day if your pain is controlled and you can tolerate liquids. Document Released: 12/12/2005 Document Revised: 10/02/2013 Document Reviewed: 07/24/2013 [facility] Patient Information 2015 [place], [place]. This information is not intended to  replace advice given to you by your health care provider. Make sure you discuss any questions you have with your health care provider.

## 2014-07-03 NOTE — Progress Notes (Signed)
Patient ID: Rickey Cruz, male   DOB: 07/24/1961, 53 y.o.   MRN: [id number]  Chief Complaint  Patient presents with  . eval gallbladder    HPI Rickey Cruz is a 53 y.o. male.  He is referred by Dr. Allegra Grana for evaluation of symptomatic gallstones.   This gentleman has had some intermittent attacks of epigastric fullness and bloating over the past few years. On July 4 he had episode of severe epigastric pain nausea and repeated episodes of vomiting. The pain was so severe he went to the emergency room. The pain resolved after about 4 hours. No problems over the past 4-5 days. Ultrasound showed gallstones but no inflammation. AST 103. ALT 310. Other LFTs normal. Lipase normal. The BBC 12,600. Glucose 170. RPR-negative. HIV-negative. He has no history of liver or cardiovascular disease.  Morbidities include hyperlipidemia, Crestor recently discontinued because of LFTs. Hypertension. 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 History open appendectomy.  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 He is originally from [place]. HPI  Past Medical History  Diagnosis Date  . Asthma     seasonal, with allergies  . Hyperlipidemia   . Hypertension   . 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 Psoriasis     Rickey Cruz/dermatology  . History of kidney stones   . Colon polyp 10/27/2011  . Allergy     Flonase, Patanol, Zyrtec  . Arthritis     psoriasis    Past Surgical History  Procedure Laterality Date  . Appendectomy    . Colonoscopy  10/27/2011    single polyp. Repeat 5 years.  . Polypectomy    . Tonsillectomy    . Septoplasty      Family History  Problem Relation Age of Onset  . Colon cancer Neg Hx   . Esophageal cancer Neg Hx   . Stomach cancer Neg Hx   . Prostate cancer Neg Hx   . Diabetes Neg Hx   . CAD Neg Hx     Social History History  Substance Use Topics  . Smoking status: Never Smoker   . Smokeless  tobacco: Never Used  . Alcohol Use: No    No Known Allergies  Current Outpatient Prescriptions  Medication Sig Dispense Refill  . amLODipine (NORVASC) 5 MG tablet Take 1 tablet (5 mg total) by mouth daily.  90 tablet  1  . clindamycin (CLINDAGEL) 1 % gel Apply 1 application topically 2 (two) times daily. Apply to face      . clobetasol (OLUX) 0.05 % topical foam Apply 1 application topically 2 (two) times daily.      Marland Kitchen desonide (DESOWEN) 0.05 % cream Apply topically 2 (two) times daily.  60 g  2  . fluocinonide (LIDEX) 0.05 % external solution Apply 1 application topically 2 (two) times daily.      . fluocinonide cream (LIDEX) 7.32 % Apply 1 application topically 2 (two) times daily.      . fluticasone (FLONASE) 50 MCG/ACT nasal spray Place 2 sprays into both nostrils daily.  16 g  11  . hydrochlorothiazide (HYDRODIURIL) 12.5 MG tablet Take 1 tablet (12.5 mg total) by mouth daily.  90 tablet  1  . ketoconazole (NIZORAL) 2 % shampoo Apply 1 application topically 2 (two) times a week.      . rosuvastatin (CRESTOR) 10 MG tablet Take 1 tablet (10 mg total) by mouth daily.  90 tablet  3   No current facility-administered medications for this visit.    Review of Systems Review of Systems  Constitutional: Negative for fever, chills and unexpected weight change.  HENT: Negative for congestion, hearing loss, sore throat, trouble swallowing and voice change.   Eyes: Negative for visual disturbance.  Respiratory: Negative for cough and wheezing.   Cardiovascular: Negative for chest pain, palpitations and leg swelling.  Gastrointestinal: Positive for nausea, vomiting, abdominal pain and abdominal distention. Negative for diarrhea, constipation, blood in stool, anal bleeding and rectal pain.  Genitourinary: Negative for hematuria and difficulty urinating.  Musculoskeletal: Negative for arthralgias.  Skin: Negative for rash and wound.  Neurological: Negative for seizures, syncope, weakness and  headaches.  Hematological: Negative for adenopathy. Does not bruise/bleed easily.  Psychiatric/Behavioral: Negative for confusion.    Blood pressure 126/74, pulse 56, height 5\' 6"  (1.676 m), weight 181 lb (82.101 kg).  Physical Exam Physical Exam  Constitutional: He is oriented to person, place, and time. He appears well-developed and well-nourished. No distress.  Very pleasant. Intelligent. Speaks English well in no distress  HENT:  Head: Normocephalic.  Nose: Nose normal.  Mouth/Throat: No oropharyngeal exudate.  Eyes: Conjunctivae and EOM are normal. Pupils are equal, round, and reactive to light. Right eye exhibits no discharge. Left eye exhibits no discharge. No scleral icterus.  Neck: Normal range of motion. Neck supple. No JVD present. No tracheal deviation present. No thyromegaly present.  Cardiovascular: Normal rate, regular rhythm, normal heart sounds and intact distal pulses.   No murmur heard. Pulmonary/Chest: Effort normal and breath sounds normal. No stridor. No respiratory distress. He has no wheezes. He has no rales. He exhibits no tenderness.  Abdominal: Soft. Bowel sounds are normal. He exhibits no distension and no mass. There is no tenderness. There is no rebound and no guarding.  Small scar right lower quadrant. Otherwise negative exam  Musculoskeletal: Normal range of motion. He exhibits no edema and no tenderness.  Lymphadenopathy:    He has no cervical adenopathy.  Neurological: He is alert and oriented to person, place, and time. He has normal reflexes. Coordination normal.  Skin: Skin is warm and dry. No rash noted. He is not diaphoretic. No erythema. No pallor.  Psychiatric: He has a normal mood and affect. His behavior is normal. Judgment and thought content normal.    Data Reviewed Ultrasound, lab work on the emergency department record.  Assessment    Chronic cholecystitis with cholelithiasis. Recent episode of severe biliary colic  Elevated  transaminases. Possibly due to gallstones. Possibly due to Crestor  Hypertension  Hyperlipidemia  Pulmonary granulomas  Status post open appendectomy     Plan    He stated he would like to go ahead with cholecystectomy at this time. He will be scheduled for laparoscopic cholecystectomy with cholangiogram, possible open  I discussed the indications, details, techniques, and numerous risk of the surgery with him. He is aware of the risk of bleeding, infection, conversion to open laparotomy, bile leak with readmission to the hospital, injury to adjacent organs, cardiac, pulmonary, and thromboembolic problems. He understands these issues well. All of his questions are answered. He agrees with this plan.        Edsel Petrin. Dalbert Batman, M.D., [facility] General and Minimally invasive Surgery Breast and Colorectal Surgery Office:   [phone] Pager:   [phone]  07/03/2014, 11:17 AM

## 2014-08-07 ENCOUNTER — Encounter: Payer: Self-pay | Admitting: Gastroenterology

## 2014-08-13 ENCOUNTER — Ambulatory Visit: Payer: BC Managed Care – PPO | Admitting: Internal Medicine

## 2014-09-07 ENCOUNTER — Ambulatory Visit (INDEPENDENT_AMBULATORY_CARE_PROVIDER_SITE_OTHER): Payer: BC Managed Care – PPO | Admitting: Emergency Medicine

## 2014-09-07 ENCOUNTER — Ambulatory Visit (INDEPENDENT_AMBULATORY_CARE_PROVIDER_SITE_OTHER): Payer: BC Managed Care – PPO

## 2014-09-07 VITALS — BP 126/90 | HR 59 | Temp 97.8°F | Resp 12 | Ht 65.75 in | Wt 177.5 lb

## 2014-09-07 DIAGNOSIS — Z23 Encounter for immunization: Secondary | ICD-10-CM

## 2014-09-07 DIAGNOSIS — K8020 Calculus of gallbladder without cholecystitis without obstruction: Secondary | ICD-10-CM

## 2014-09-07 DIAGNOSIS — Z79899 Other long term (current) drug therapy: Secondary | ICD-10-CM

## 2014-09-07 LAB — POCT CBC
Granulocyte percent: 67 %G (ref 37–80)
HCT, POC: 49.5 % (ref 43.5–53.7)
HEMOGLOBIN: 16.7 g/dL (ref 14.1–18.1)
LYMPH, POC: 1.3 (ref 0.6–3.4)
MCH, POC: 30.5 pg (ref 27–31.2)
MCHC: 33.8 g/dL (ref 31.8–35.4)
MCV: 90 fL (ref 80–97)
MID (CBC): 0.4 (ref 0–0.9)
MPV: 6.9 fL (ref 0–99.8)
POC GRANULOCYTE: 3.3 (ref 2–6.9)
POC LYMPH %: 25.9 % (ref 10–50)
POC MID %: 7.1 % (ref 0–12)
Platelet Count, POC: 287 10*3/uL (ref 142–424)
RBC: 5.5 M/uL (ref 4.69–6.13)
RDW, POC: 13 %
WBC: 5 10*3/uL (ref 4.6–10.2)

## 2014-09-07 LAB — COMPREHENSIVE METABOLIC PANEL
ALK PHOS: 45 U/L (ref 39–117)
ALT: 19 U/L (ref 0–53)
AST: 17 U/L (ref 0–37)
Albumin: 4.5 g/dL (ref 3.5–5.2)
BUN: 10 mg/dL (ref 6–23)
CO2: 29 mEq/L (ref 19–32)
CREATININE: 0.89 mg/dL (ref 0.50–1.35)
Calcium: 9.2 mg/dL (ref 8.4–10.5)
Chloride: 99 mEq/L (ref 96–112)
GLUCOSE: 94 mg/dL (ref 70–99)
Potassium: 3.9 mEq/L (ref 3.5–5.3)
SODIUM: 136 meq/L (ref 135–145)
TOTAL PROTEIN: 7.2 g/dL (ref 6.0–8.3)
Total Bilirubin: 0.7 mg/dL (ref 0.2–1.2)

## 2014-09-07 IMAGING — CR DG CHEST 2V
2 series · 2 of 2 positions shown · non-contrast
Comparison: None.

CLINICAL DATA: Chest discomfort

EXAM:
CHEST  2 VIEW

[PA]
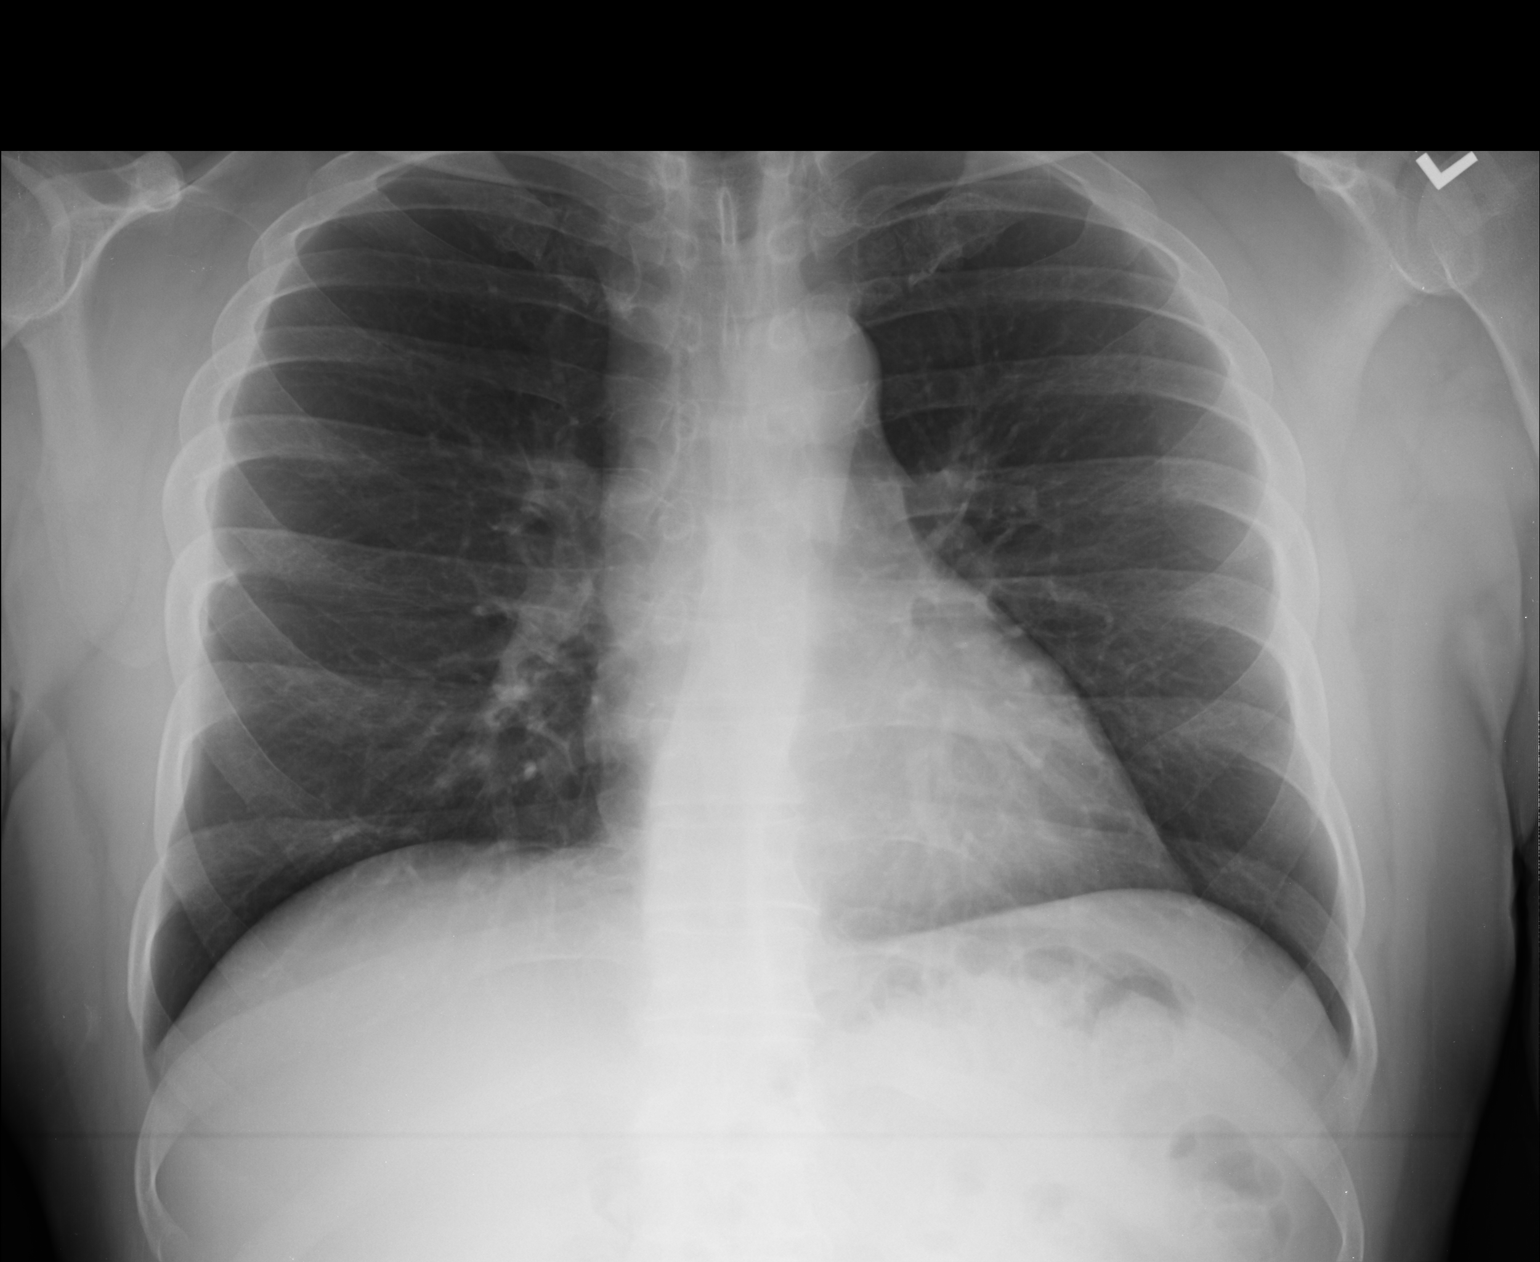

[lateral]
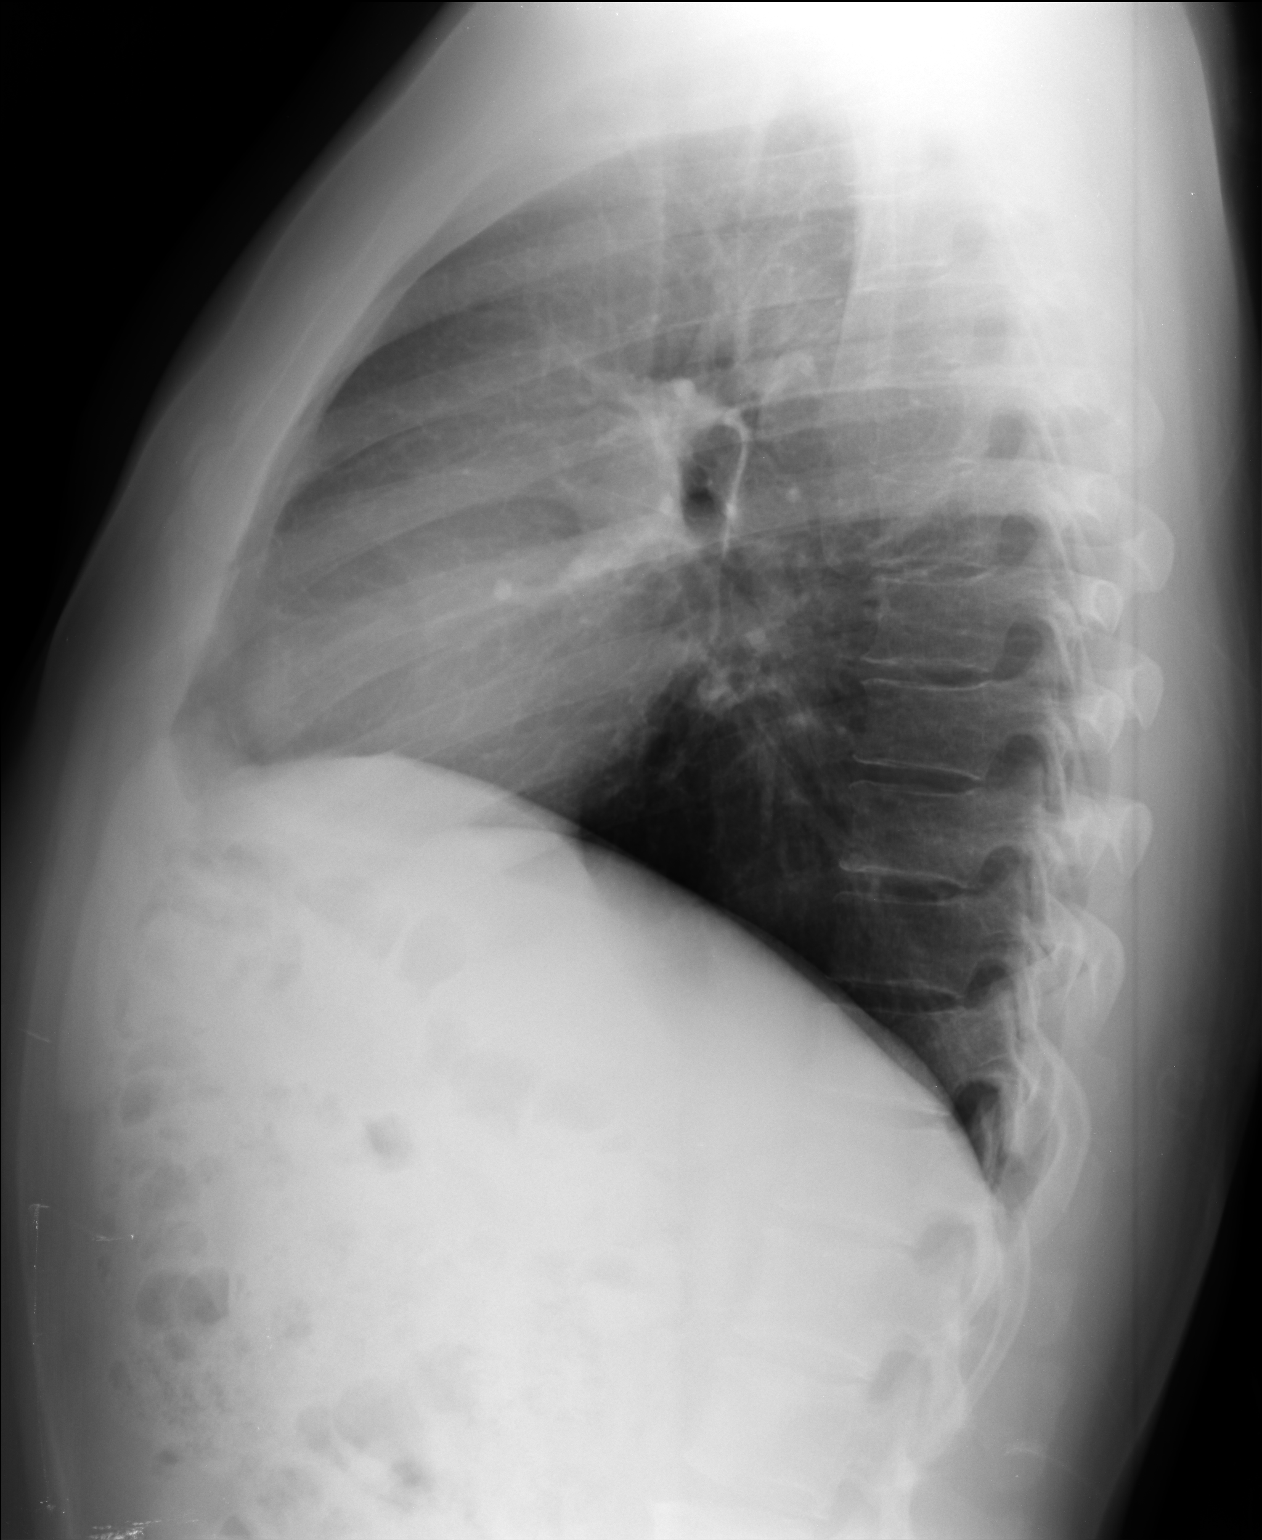

[2 of 2 positions shown; findings below may reference images not displayed]

FINDINGS: The cardiomediastinal silhouette is unremarkable.

There is no evidence of focal airspace disease, pulmonary edema,
suspicious pulmonary nodule/mass, pleural effusion, or pneumothorax.
No acute bony abnormalities are identified.
IMPRESSION: No active cardiopulmonary disease.

## 2014-09-07 NOTE — Progress Notes (Signed)
[facility] [place] [address] [place] [address] [phone]  Date:  09/07/2014   Name:  Rickey Cruz   DOB:  1961-03-03   MRN:  [id number]  PCP:  Scarlette Calico, MD    Chief Complaint: Immunizations and Abdominal Pain   History of Present Illness:  Rickey Cruz is a 53 y.o. very pleasant male patient who presents with the following:  Hypertension and cholelithiasis.  Recommended surgery but not accomplished due insurance issues.  Now has new insurance.   Some nausea but no vomiting. Belching.   No food intolerance.  Some back pain on right and epigastric pain. Started new job. No improvement with over the counter medications or other home remedies. Denies other complaint or health concern today.   Patient Active Problem List   Diagnosis Date Noted  . Cholelithiasis 06/30/2014  . Psoriasis 06/27/2014  . Multiple lung nodules 12/09/2011  . BACK PAIN 06/10/2010  . DYSPNEA ON EXERTION 03/16/2010  . PULMONARY HYPERTENSION 03/04/2010  . HYPERLIPIDEMIA 02/10/2010  . HYPERTENSION 02/10/2010  . ALLERGIC RHINITIS 02/10/2010  . BRONCHITIS 02/10/2010  . ASTHMA 02/10/2010  . OBSTRUCTIVE SLEEP APNEA 02/04/2010  . TUBERCULOSIS, HX OF 02/04/2010    Past Medical History  Diagnosis Date  . Asthma     seasonal, with allergies  . Hyperlipidemia   . Hypertension   . 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 Psoriasis     [name]  . History of kidney stones   . Colon polyp 10/27/2011  . Allergy     Flonase, Patanol, Zyrtec  . Arthritis     psoriasis    Past Surgical History  Procedure Laterality Date  . Appendectomy    . Colonoscopy  10/27/2011    single polyp. Repeat 5 years.  . Polypectomy    . Tonsillectomy    . Septoplasty      History  Substance Use Topics  . Smoking status: Never Smoker   . Smokeless tobacco: Never Used  . Alcohol Use: No    Family History  Problem Relation Age of Onset  . Colon cancer Neg Hx    . Esophageal cancer Neg Hx   . Stomach cancer Neg Hx   . Prostate cancer Neg Hx   . Diabetes Neg Hx   . CAD Neg Hx     No Known Allergies  Medication list has been reviewed and updated.  Current Outpatient Prescriptions on File Prior to Visit  Medication Sig Dispense Refill  . amLODipine (NORVASC) 5 MG tablet Take 1 tablet (5 mg total) by mouth daily.  90 tablet  1  . clindamycin (CLINDAGEL) 1 % gel Apply 1 application topically 2 (two) times daily. Apply to face      . clobetasol (OLUX) 0.05 % topical foam Apply 1 application topically 2 (two) times daily.      Marland Kitchen desonide (DESOWEN) 0.05 % cream Apply topically 2 (two) times daily.  60 g  2  . fluocinonide (LIDEX) 0.05 % external solution Apply 1 application topically 2 (two) times daily.      . fluticasone (FLONASE) 50 MCG/ACT nasal spray Place 2 sprays into both nostrils daily.  16 g  11  . hydrochlorothiazide (HYDRODIURIL) 12.5 MG tablet Take 1 tablet (12.5 mg total) by mouth daily.  90 tablet  1  . ketoconazole (NIZORAL) 2 % shampoo Apply 1 application topically 2 (two) times a week.       No  current facility-administered medications on file prior to visit.    Review of Systems:  As per HPI, otherwise negative.    Physical Examination: Filed Vitals:   09/07/14 1053  BP: 126/90  Pulse: 59  Temp: 97.8 F (36.6 C)  Resp: 12   Filed Vitals:   09/07/14 1053  Height: 5' 5.75" (1.67 m)  Weight: 177 lb 8 oz (80.513 kg)   Body mass index is 28.87 kg/(m^2). Ideal Body Weight: Weight in (lb) to have BMI = 25: 153.4  GEN: WDWN, NAD, Non-toxic, A & O x 3 HEENT: Atraumatic, Normocephalic. Neck supple. No masses, No LAD. Ears and Nose: No external deformity. CV: RRR, No M/G/R. No JVD. No thrill. No extra heart sounds. PULM: CTA B, no wheezes, crackles, rhonchi. No retractions. No resp. distress. No accessory muscle use. ABD: S, NT, ND, +BS. No rebound. No HSM. EXTR: No c/c/e NEURO Normal gait.  PSYCH: Normally  interactive. Conversant. Not depressed or anxious appearing.  Calm demeanor.    Assessment and Plan: Cholelithiasis  Signed,  Ellison Carwin, MD   [facility] reading (PRIMARY) by  Dr. Ouida Sills.  Seemingly widened mediastinum but think it is rotation.Marland Kitchen

## 2014-09-29 ENCOUNTER — Encounter: Payer: Self-pay | Admitting: Family Medicine

## 2014-09-29 ENCOUNTER — Ambulatory Visit (INDEPENDENT_AMBULATORY_CARE_PROVIDER_SITE_OTHER): Payer: BC Managed Care – PPO | Admitting: Family Medicine

## 2014-09-29 VITALS — BP 130/92 | HR 73 | Temp 98.2°F | Resp 16 | Ht 65.5 in | Wt 174.8 lb

## 2014-09-29 DIAGNOSIS — K648 Other hemorrhoids: Secondary | ICD-10-CM

## 2014-09-29 DIAGNOSIS — R972 Elevated prostate specific antigen [PSA]: Secondary | ICD-10-CM

## 2014-09-29 DIAGNOSIS — R7302 Impaired glucose tolerance (oral): Secondary | ICD-10-CM

## 2014-09-29 DIAGNOSIS — I10 Essential (primary) hypertension: Secondary | ICD-10-CM

## 2014-09-29 DIAGNOSIS — K8020 Calculus of gallbladder without cholecystitis without obstruction: Secondary | ICD-10-CM

## 2014-09-29 DIAGNOSIS — K644 Residual hemorrhoidal skin tags: Secondary | ICD-10-CM

## 2014-09-29 LAB — IFOBT (OCCULT BLOOD): IFOBT: NEGATIVE

## 2014-09-29 MED ORDER — HYDROCORTISONE ACETATE 25 MG RE SUPP: 25.0000 mg | Freq: Two times a day (BID) | RECTAL | Status: DC

## 2014-09-29 NOTE — Progress Notes (Signed)
Subjective:    Patient ID: Rickey Cruz, male    DOB: Oct 18, 1961, 53 y.o.   MRN: [id number] This chart was scribed for Rickey Forts, MD by Zola Button, Medical Scribe. This patient was seen in Room 21 and the patient's care was started at 3:20 PM.   09/29/2014  Hypertension and Hyperglycemia   HPI HPI Comments: Rickey Cruz is a 53 y.o. male who presents to [facility] for a follow-up for high blood pressure, high blood sugar and high PSA. Patient was last seen by me 3 months ago, and he had a gallbladder attack the following day, for which he had to go to the ED. Patient will have a surgery for his gallbladder by Dr. Dalbert Batman. He has recently got a new job and is having some issues with his insurance. Patient states that he enjoys his new job. He does not have a device to measure blood pressure at home. Patient has been working on losing weight, having lost about 10 pounds already over the last few months, and states that he has been feeling better. He has been reducing carbohydrates from his diet to lose weight. 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   Patient reports a bump in his anus on the right side and has some concerns. The bump has grown smaller since its initial appearance and does not itch, but there is occasional associated pain with bowel movement and he states that it makes him want to use the restroom more. He denies blood in the stool. Patient had a colonoscopy about 3 years ago.   He states that he does not want to have a flu shot. Patient normally has seasonal allergies, but he had some allergy shots and has not had any allergy symptoms this year. He has normal bowel movements and his appetite has been normal. He denies CP, SOB and burning with urination.  Presenting for repeat PSA; no history of elevated PSA in the past.  Denies urinary symptoms or hesitancy.  No family history of prostate  cancer.   Review of Systems  Constitutional: Negative for fever, chills, diaphoresis, activity change, appetite change and fatigue.  Eyes: Negative for visual disturbance.  Respiratory: Negative for cough and shortness of breath.   Cardiovascular: Negative for chest pain, palpitations and leg swelling.  Gastrointestinal: Positive for rectal pain. Negative for nausea, vomiting, abdominal pain, diarrhea, constipation, blood in stool, abdominal distention and anal bleeding.  Endocrine: Negative for cold intolerance, heat intolerance, polydipsia, polyphagia and polyuria.  Genitourinary: Negative for dysuria, urgency, frequency, hematuria, flank pain, decreased urine volume, discharge, penile swelling, scrotal swelling, difficulty urinating, penile pain and testicular pain.  Allergic/Immunologic: Negative for environmental allergies.  Neurological: Negative for dizziness, tremors, seizures, syncope, facial asymmetry, speech difficulty, weakness, light-headedness, numbness and headaches.    Past Medical History  Diagnosis Date  . Asthma     seasonal, with allergies  . Hyperlipidemia   . Hypertension   . 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 Psoriasis     [name]  . History of kidney stones   . Colon polyp 10/27/2011  . Allergy     Flonase, Patanol, Zyrtec  . Arthritis     psoriasis   Past Surgical History  Procedure Laterality Date  . Appendectomy    . Colonoscopy  10/27/2011    single polyp. Repeat 5 years.  . Polypectomy    .  Tonsillectomy    . Septoplasty     No Known Allergies Current Outpatient Prescriptions  Medication Sig Dispense Refill  . amLODipine (NORVASC) 5 MG tablet Take 1 tablet (5 mg total) by mouth daily.  90 tablet  1  . clindamycin (CLINDAGEL) 1 % gel Apply 1 application topically 2 (two) times daily. Apply to face      . clobetasol (OLUX) 0.05 % topical foam Apply 1 application topically 2 (two) times daily.      Marland Kitchen  desonide (DESOWEN) 0.05 % cream Apply topically 2 (two) times daily.  60 g  2  . fluocinonide (LIDEX) 0.05 % external solution Apply 1 application topically 2 (two) times daily.      . fluticasone (FLONASE) 50 MCG/ACT nasal spray Place 2 sprays into both nostrils daily.  16 g  11  . hydrochlorothiazide (HYDRODIURIL) 12.5 MG tablet Take 1 tablet (12.5 mg total) by mouth daily.  90 tablet  1  . ketoconazole (NIZORAL) 2 % shampoo Apply 1 application topically 2 (two) times a week.      . hydrocortisone (ANUSOL-HC) 25 MG suppository Place 1 suppository (25 mg total) rectally 2 (two) times daily.  12 suppository  2   No current facility-administered medications for this visit.       Objective:    BP 130/92  Pulse 73  Temp(Src) 98.2 F (36.8 C) (Oral)  Resp 16  Ht 5' 5.5" (1.664 m)  Wt 174 lb 12.8 oz (79.289 kg)  BMI 28.64 kg/m2  SpO2 98% Physical Exam  Nursing note and vitals reviewed. Constitutional: He is oriented to person, place, and time. He appears well-developed and well-nourished. No distress.  HENT:  Head: Normocephalic and atraumatic.  Right Ear: External ear normal.  Left Ear: External ear normal.  Nose: Nose normal.  Mouth/Throat: Oropharynx is clear and moist. No oropharyngeal exudate.  Eyes: Conjunctivae and EOM are normal. Pupils are equal, round, and reactive to light.  Neck: Neck supple. No thyromegaly present.  Cardiovascular: Normal rate, regular rhythm and normal heart sounds.  Exam reveals no gallop and no friction rub.   No murmur heard. Pulmonary/Chest: Effort normal and breath sounds normal. No respiratory distress. He has no wheezes. He has no rales.  Abdominal: Soft. Bowel sounds are normal. He exhibits no distension and no mass. There is no tenderness. There is no rebound and no guarding.  Genitourinary: Rectal exam shows external hemorrhoid and tenderness. Rectal exam shows no fissure.     Rectum: Moderate sized hemorrhoid, thrombosed, mildly tender, no  rectal masses  Musculoskeletal: He exhibits no edema.  Lymphadenopathy:    He has no cervical adenopathy.  Neurological: He is alert and oriented to person, place, and time. No cranial nerve deficit.  Skin: Skin is warm and dry. No rash noted.  Psychiatric: He has a normal mood and affect. His behavior is normal.        Assessment & Plan:   1. Hemorrhoids, external   2. Elevated PSA   3. Glucose intolerance (impaired glucose tolerance)   4. Essential hypertension   5. Calculus of gallbladder without cholecystitis without obstruction     1.  HTN: moderately controlled; no changes to management at this time; continue to work on weight loss and dietary modification. 2.  Glucose intolerance: New; continue with weight loss, dietary modification; recommend formal exercise program. 3.  Elevated PSA: new; repeat today.  If remains elevated, refer to urology. 4.  Calculus of gallbladder: New.  To schedule cholecystectomy. 5.  External hemorrhoid inflamed: New. Rx for Anusol [facility] suppositories provided to use bid for one week and then PRN. Recommend sitz baths daily.   Meds ordered this encounter  Medications  . hydrocortisone (ANUSOL-HC) 25 MG suppository    Sig: Place 1 suppository (25 mg total) rectally 2 (two) times daily.    Dispense:  12 suppository    Refill:  2    Return in about 3 months (around 12/30/2014) for recheck high blood pressure, elevated sugar.   I personally performed the services described in this documentation, which was scribed in my presence.  The recorded information has been reviewed and is accurate.  Rickey Cruz, M.D.  [facility] [address] [place], [place]  [address] [phone] phone [phone] fax

## 2014-09-29 NOTE — Patient Instructions (Signed)

## 2014-09-30 LAB — PSA: PSA: 4.44 ng/mL — ABNORMAL HIGH (ref <=4.00)

## 2014-09-30 LAB — HEMOGLOBIN A1C
HEMOGLOBIN A1C: 5.9 % — AB (ref <5.7)
Mean Plasma Glucose: 123 mg/dL — ABNORMAL HIGH (ref <117)

## 2014-10-26 HISTORY — PX: PROSTATE BIOPSY: SHX241

## 2014-11-19 ENCOUNTER — Other Ambulatory Visit (INDEPENDENT_AMBULATORY_CARE_PROVIDER_SITE_OTHER): Payer: Self-pay | Admitting: General Surgery

## 2014-12-12 ENCOUNTER — Other Ambulatory Visit: Payer: Self-pay | Admitting: Family Medicine

## 2014-12-12 NOTE — Patient Instructions (Addendum)
Rickey Cruz  12/12/2014   Your procedure is scheduled on: Tuesday 12/16/14  Report to [facility]  Entrance and follow signs to               [place] at  05:30 AM.  Call this number if you have problems the morning of surgery [phone]   Remember:  Do not eat food or drink liquids :After Midnight.     Take these medicines the morning of surgery with A SIP OF WATER: amlodipine, flonase if needed                               You may not have any metal on your body including hair pins and              piercings  Do not wear jewelry, make-up, lotions, powders or perfumes.             Do not wear nail polish.  Do not shave  48 hours prior to surgery.              Men may shave face and neck.  Do not bring valuables to the hospital. [place].  Contacts, dentures or bridgework may not be worn into surgery.      Patients discharged the day of surgery will not be allowed to drive home.  Name and phone number of your driver: Cori Razor [phone] or Bryson Ha [phone]   _____________________________________________________________________             [facility] - Preparing for Surgery Before surgery, you can play an important role.  Because skin is not sterile, your skin needs to be as free of germs as possible.  You can reduce the number of germs on your skin by washing with CHG (chlorahexidine gluconate) soap before surgery.  CHG is an antiseptic cleaner which kills germs and bonds with the skin to continue killing germs even after washing. Please DO NOT use if you have an allergy to CHG or antibacterial soaps.  If your skin becomes reddened/irritated stop using the CHG and inform your nurse when you arrive at Short Stay. Do not shave (including legs and underarms) for at least 48 hours prior to the first CHG shower.  You may shave your face/neck. Please follow these instructions carefully:  1.  Shower  with CHG Soap the night before surgery and the  morning of Surgery.  2.  If you choose to wash your hair, wash your hair first as usual with your  normal  shampoo.  3.  After you shampoo, rinse your hair and body thoroughly to remove the  shampoo.                            4.  Use CHG as you would any other liquid soap.  You can apply chg directly  to the skin and wash                       Gently with a scrungie or clean washcloth.  5.  Apply the CHG Soap to your body ONLY FROM THE NECK DOWN.   Do not use on face/ open  Wound or open sores. Avoid contact with eyes, ears mouth and genitals (private parts).                       Wash face,  Genitals (private parts) with your normal soap.             6.  Wash thoroughly, paying special attention to the area where your surgery  will be performed.  7.  Thoroughly rinse your body with warm water from the neck down.  8.  DO NOT shower/wash with your normal soap after using and rinsing off  the CHG Soap.                9.  Pat yourself dry with a clean towel.            10.  Wear clean pajamas.            11.  Place clean sheets on your bed the night of your first shower and do not  sleep with pets. Day of Surgery : Do not apply any lotions/deodorants the morning of surgery.  Please wear clean clothes to the hospital/surgery center.  FAILURE TO FOLLOW THESE INSTRUCTIONS MAY RESULT IN THE CANCELLATION OF YOUR SURGERY PATIENT SIGNATURE_________________________________  NURSE SIGNATURE__________________________________  ________________________________________________________________________

## 2014-12-13 NOTE — H&P (Signed)
Rickey Cruz  Location: [facility] Patient #: [id number] DOB: [birth year] Single / Language: Rickey Cruz / Race: White Male      History of Present Illness Patient words: eval gallstones.  The patient is a 53 year old male who presents for evaluation of gall stones. This is a 53 year old [name], originally referred by Dr. Reginia Cruz. He returns for further discussion and surgical planning for his symptomatic gallstones. He has had some intermittent attacks of epigastric fullness and bloating over the past few years. On July 4 he had another episode of severe epigastric pain nausea and repeated bouts of vomiting. He went to the emergency room where an ultrasound showed multiple gallstones but no inflammation. AST 103. A LT 310. Other liver function tests normal. Lipase normal. Glucose 170. RPR negative. HIV negative. He lost his insurance and so held off on the gallbladder surgery. He has had less attacks.  he is afraid that he is going to have problems with his liver or pancreas and wants to have the cholecystectomy now that he has his insurance back. Crestor was discontinued and his liver function tests have normalized. He recently saw Dr. Janice Cruz and had prostate biopsies because of an elevated PSA. Those results are pending. 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    I have discussed the indications, details, techniques, and numerous risk of cholecystectomy with him. He is aware the risk of bleeding, infection, bile leak, conversion to open laparotomy, injury to adjacent organs with major reconstructive surgery, wound hernia, cardiac pulmonary and thromboembolic problems. He understands all these issues well. At this time all his questions are answered. He agrees with this plan.   Other Problems  Asthma Cholelithiasis High blood pressure Hypercholesterolemia Kidney Stone Sleep Apnea  Past Surgical History Appendectomy Colon Polyp Removal -  Colonoscopy Tonsillectomy  Diagnostic Studies History Colonoscopy 1-5 years ago  Allergies  No Known Drug Allergies11/25/2015  Medication History  Norvasc (5MG  Tablet, Oral) Active. Desonide (0.05% Cream, External) Active. Clobetasol Propionate (0.05% Foam, External) Active. Hydrochlorothiazide (12.5MG  Tablet, Oral) Active.  Social History  Alcohol use Occasional alcohol use. Caffeine use Coffee. No drug use Tobacco use Never smoker.  Family History  Family history unknown First Degree Relatives  Review of Systems  General Not Present- Appetite Loss, Chills, Fatigue, Fever, Night Sweats, Weight Gain and Weight Loss. Skin Not Present- Change in Wart/Mole, Dryness, Hives, Jaundice, New Lesions, Non-Healing Wounds, Rash and Ulcer. HEENT Not Present- Earache, Hearing Loss, Hoarseness, Nose Bleed, Oral Ulcers, Ringing in the Ears, Seasonal Allergies, Sinus Pain, Sore Throat, Visual Disturbances, Wears glasses/contact lenses and Yellow Eyes. Respiratory Not Present- Bloody sputum, Chronic Cough, Difficulty Breathing, Snoring and Wheezing. Breast Not Present- Breast Mass, Breast Pain, Nipple Discharge and Skin Changes. Cardiovascular Not Present- Chest Pain, Difficulty Breathing Lying Down, Leg Cramps, Palpitations, Rapid Heart Rate, Shortness of Breath and Swelling of Extremities. Gastrointestinal Not Present- Abdominal Pain, Bloating, Bloody Stool, Change in Bowel Habits, Chronic diarrhea, Constipation, Difficulty Swallowing, Excessive gas, Gets full quickly at meals, Hemorrhoids, Indigestion, Nausea, Rectal Pain and Vomiting. Male Genitourinary Not Present- Blood in Urine, Change in Urinary Stream, Frequency, Impotence, Nocturia, Painful Urination, Urgency and Urine Leakage. Musculoskeletal Not Present- Back Pain, Joint Pain, Joint Stiffness, Muscle Pain, Muscle Weakness and Swelling of Extremities. Neurological Not Present- Decreased Memory, Fainting, Headaches,  Numbness, Seizures, Tingling, Tremor, Trouble walking and Weakness. Psychiatric Not Present- Anxiety, Bipolar, Change in Sleep Pattern, Depression, Fearful and Frequent crying. Endocrine Not Present- Cold Intolerance, Excessive Hunger, Hair Changes,  Heat Intolerance, Hot flashes and New Diabetes. Hematology Not Present- Easy Bruising, Excessive bleeding, Gland problems, HIV and Persistent Infections.   Vitals  11/19/2014 3:03 PM Weight: 177.38 lb Height: 66in Body Surface Area: 1.94 m Body Mass Index: 28.63 kg/m Temp.: 98.40F  Pulse: 67 (Regular)  BP: 120/80 (Sitting, Left Arm, Standard)    Physical Exam General Note: Alert. No distress. Very pleasant.   Head and Neck Note: No adenopathy or mass   Chest and Lung Exam Note: Lungs are clear to auscultation bilaterally. No chest wall tenderness.   Cardiovascular Note: Regular rate and rhythm. No murmur. No active ectopy. Radial pulses palpable   Abdomen Note: Abdomen soft. Nontender. Nondistended. No mass. No hernia. Right lower quadrant scar from appendectomy     Assessment & Plan  GALLSTONES (574.20  K80.20) Current Plans  Schedule for Surgery As we discussed in July, you have gallstones and have had gallbladder attacks. It is good that your liver function tests have normalized. The elevated liver function tests might have been due to gallstones or might be due to the Crestor that you stopped taking Your advised to call Dr. Janice Cruz about yourprostate biopsy  Schedule for laparoscopic cholecystectomy with cholangiogram, possible open cholecystectomy   SLEEP APNEA IN ADULT (327.23  G47.33) HYPERLIPIDEMIA, ACQUIRED (272.4  E78.5) ELEVATED PSA (790.93  R97.2) HYPERTENSION, BENIGN (401.1  I10)   Rickey Cruz M. Dalbert Batman, M.D., [facility] General and Minimally invasive Surgery Breast and Colorectal Surgery Office:   [phone] Pager:   [phone]

## 2014-12-15 ENCOUNTER — Encounter (HOSPITAL_COMMUNITY)
Admission: RE | Admit: 2014-12-15 | Discharge: 2014-12-15 | Disposition: A | Discharge status: Home / Self Care | Payer: BC Managed Care – PPO | Source: Ambulatory Visit | Attending: General Surgery | Admitting: General Surgery

## 2014-12-15 ENCOUNTER — Encounter (HOSPITAL_COMMUNITY): Payer: Self-pay

## 2014-12-15 DIAGNOSIS — E780 Pure hypercholesterolemia: Secondary | ICD-10-CM | POA: Diagnosis not present

## 2014-12-15 DIAGNOSIS — K8010 Calculus of gallbladder with chronic cholecystitis without obstruction: Secondary | ICD-10-CM | POA: Diagnosis not present

## 2014-12-15 DIAGNOSIS — G4730 Sleep apnea, unspecified: Secondary | ICD-10-CM | POA: Diagnosis not present

## 2014-12-15 DIAGNOSIS — K8020 Calculus of gallbladder without cholecystitis without obstruction: Secondary | ICD-10-CM | POA: Diagnosis present

## 2014-12-15 DIAGNOSIS — Z87442 Personal history of urinary calculi: Secondary | ICD-10-CM | POA: Diagnosis not present

## 2014-12-15 DIAGNOSIS — I10 Essential (primary) hypertension: Secondary | ICD-10-CM | POA: Diagnosis not present

## 2014-12-15 DIAGNOSIS — J45909 Unspecified asthma, uncomplicated: Secondary | ICD-10-CM | POA: Diagnosis not present

## 2014-12-15 HISTORY — DX: Gastro-esophageal reflux disease without esophagitis: K21.9

## 2014-12-15 HISTORY — DX: Low back pain, unspecified: M54.50

## 2014-12-15 HISTORY — DX: Prediabetes: R73.03

## 2014-12-15 HISTORY — DX: Major depressive disorder, single episode, unspecified: F32.9

## 2014-12-15 HISTORY — DX: Depression, unspecified: F32.A

## 2014-12-15 HISTORY — DX: Low back pain: M54.5

## 2014-12-15 LAB — COMPREHENSIVE METABOLIC PANEL
ALT: 23 U/L (ref 0–53)
AST: 18 U/L (ref 0–37)
Albumin: 4.1 g/dL (ref 3.5–5.2)
Alkaline Phosphatase: 58 U/L (ref 39–117)
Anion gap: 13 (ref 5–15)
BILIRUBIN TOTAL: 0.5 mg/dL (ref 0.3–1.2)
BUN: 12 mg/dL (ref 6–23)
CHLORIDE: 101 meq/L (ref 96–112)
CO2: 30 meq/L (ref 19–32)
Calcium: 10 mg/dL (ref 8.4–10.5)
Creatinine, Ser: 1.02 mg/dL (ref 0.50–1.35)
GFR calc Af Amer: >90 mL/min (ref >90)
GFR, EST NON AFRICAN AMERICAN: 82 mL/min — AB (ref >90)
GLUCOSE: 106 mg/dL — AB (ref 70–99)
Potassium: 4 mEq/L (ref 3.7–5.3)
Sodium: 144 mEq/L (ref 137–147)
Total Protein: 7.7 g/dL (ref 6.0–8.3)

## 2014-12-15 LAB — CBC WITH DIFFERENTIAL/PLATELET
Basophils Absolute: 0 10*3/uL (ref 0.0–0.1)
Basophils Relative: 0 % (ref 0–1)
Eosinophils Absolute: 0.1 10*3/uL (ref 0.0–0.7)
Eosinophils Relative: 1 % (ref 0–5)
HCT: 50.1 % (ref 39.0–52.0)
HEMOGLOBIN: 17.1 g/dL — AB (ref 13.0–17.0)
LYMPHS PCT: 19 % (ref 12–46)
Lymphs Abs: 1.2 10*3/uL (ref 0.7–4.0)
MCH: 30.6 pg (ref 26.0–34.0)
MCHC: 34.1 g/dL (ref 30.0–36.0)
MCV: 89.6 fL (ref 78.0–100.0)
MONO ABS: 0.6 10*3/uL (ref 0.1–1.0)
MONOS PCT: 9 % (ref 3–12)
NEUTROS ABS: 4.6 10*3/uL (ref 1.7–7.7)
Neutrophils Relative %: 71 % (ref 43–77)
Platelets: 293 10*3/uL (ref 150–400)
RBC: 5.59 MIL/uL (ref 4.22–5.81)
RDW: 12.2 % (ref 11.5–15.5)
WBC: 6.5 10*3/uL (ref 4.0–10.5)

## 2014-12-15 NOTE — Progress Notes (Signed)
Chest x-ray 09/07/14 on EPIC, EKG 06/29/14 on EPIC

## 2014-12-16 ENCOUNTER — Encounter (HOSPITAL_COMMUNITY)
Admission: RE | Disposition: A | Discharge status: Home / Self Care | Payer: Self-pay | Source: Ambulatory Visit | Attending: General Surgery

## 2014-12-16 ENCOUNTER — Ambulatory Visit (HOSPITAL_COMMUNITY): Payer: BC Managed Care – PPO | Admitting: Certified Registered"

## 2014-12-16 ENCOUNTER — Encounter (HOSPITAL_COMMUNITY): Payer: Self-pay | Admitting: *Deleted

## 2014-12-16 ENCOUNTER — Ambulatory Visit (HOSPITAL_COMMUNITY): Payer: BC Managed Care – PPO

## 2014-12-16 ENCOUNTER — Ambulatory Visit (HOSPITAL_COMMUNITY)
Admission: RE | Admit: 2014-12-16 | Discharge: 2014-12-16 | Disposition: A | Discharge status: Home / Self Care | Payer: BC Managed Care – PPO | Source: Ambulatory Visit | Attending: General Surgery | Admitting: General Surgery

## 2014-12-16 DIAGNOSIS — K8020 Calculus of gallbladder without cholecystitis without obstruction: Secondary | ICD-10-CM | POA: Diagnosis present

## 2014-12-16 DIAGNOSIS — Z419 Encounter for procedure for purposes other than remedying health state, unspecified: Secondary | ICD-10-CM

## 2014-12-16 DIAGNOSIS — Z87442 Personal history of urinary calculi: Secondary | ICD-10-CM | POA: Insufficient documentation

## 2014-12-16 DIAGNOSIS — G4730 Sleep apnea, unspecified: Secondary | ICD-10-CM | POA: Insufficient documentation

## 2014-12-16 DIAGNOSIS — K8010 Calculus of gallbladder with chronic cholecystitis without obstruction: Secondary | ICD-10-CM | POA: Diagnosis not present

## 2014-12-16 DIAGNOSIS — J45909 Unspecified asthma, uncomplicated: Secondary | ICD-10-CM | POA: Insufficient documentation

## 2014-12-16 DIAGNOSIS — I10 Essential (primary) hypertension: Secondary | ICD-10-CM | POA: Insufficient documentation

## 2014-12-16 DIAGNOSIS — E780 Pure hypercholesterolemia: Secondary | ICD-10-CM | POA: Insufficient documentation

## 2014-12-16 HISTORY — PX: CHOLECYSTECTOMY: SHX55

## 2014-12-16 IMAGING — RF DG CHOLANGIOGRAM OPERATIVE
1 series · 12 of 12 positions shown · non-contrast
Comparison: None.

CLINICAL DATA: 53-year-old male with cholecystectomy.

EXAM:
INTRAOPERATIVE CHOLANGIOGRAM

[Series 1: run · 3 acquisitions, 12 frames shown]
[im 1/3]
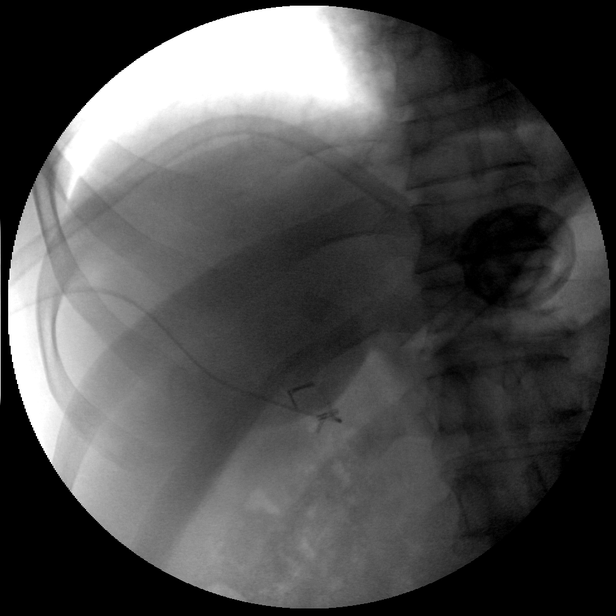
[im 1/3]
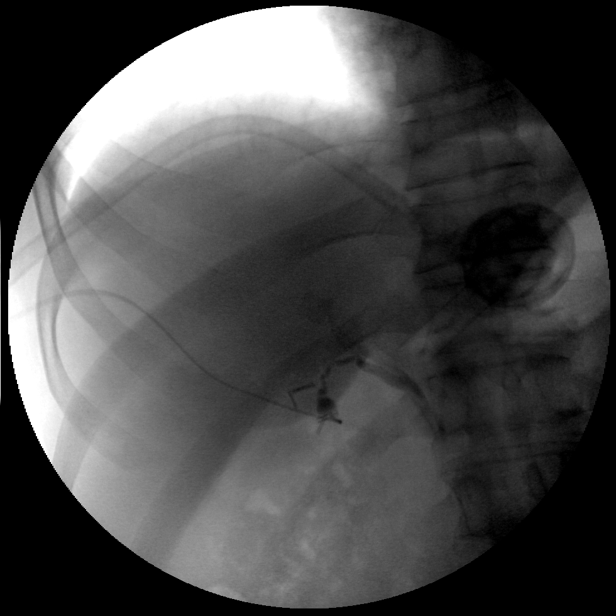
[im 1/3]
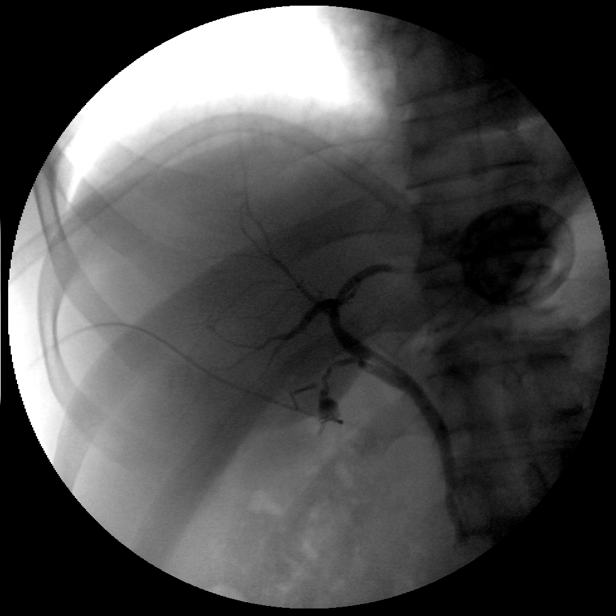
[im 1/3]
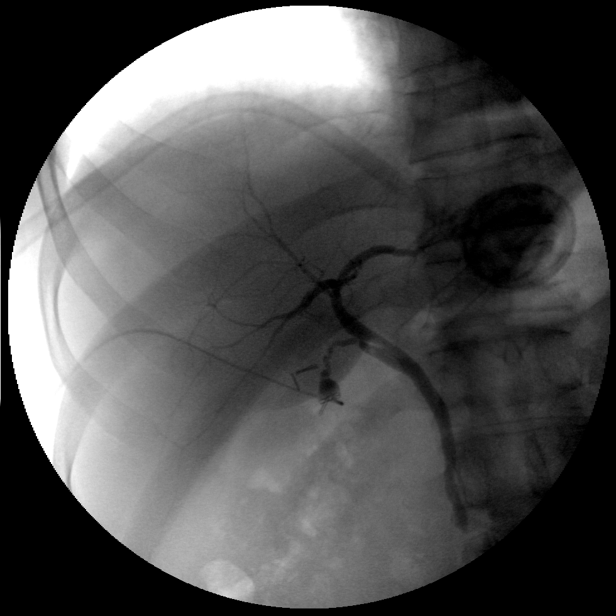
[im 2/3]
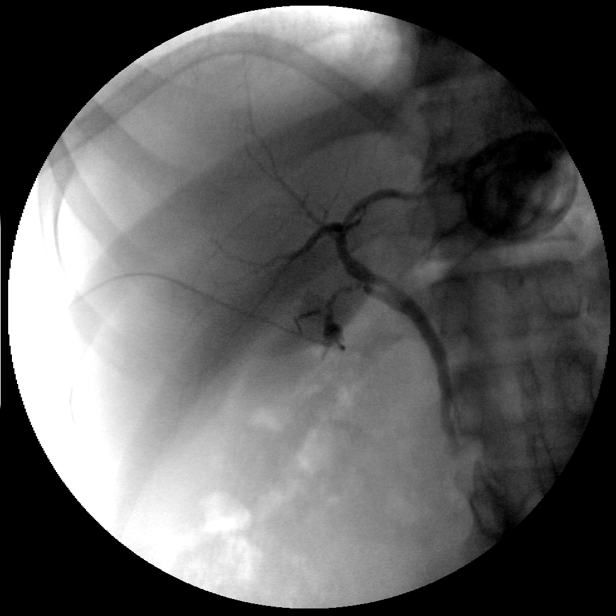
[im 2/3]
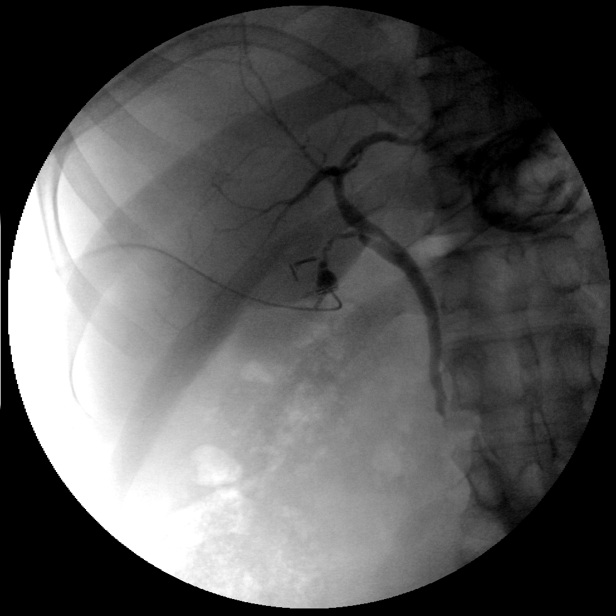
[im 2/3]
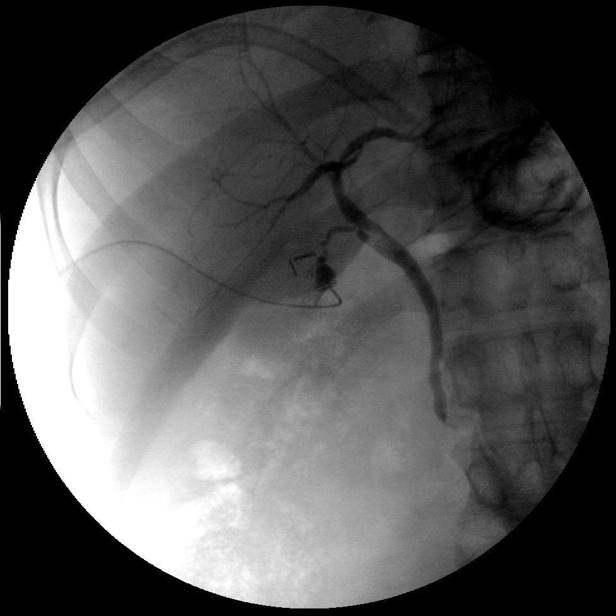
[im 2/3]
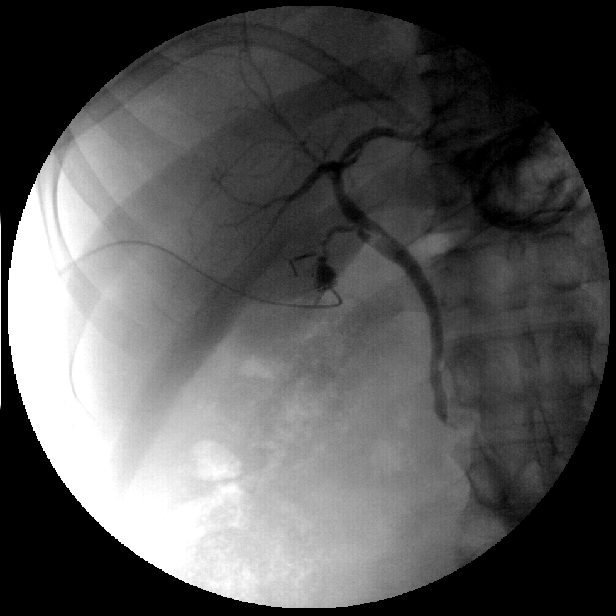
[im 3/3]
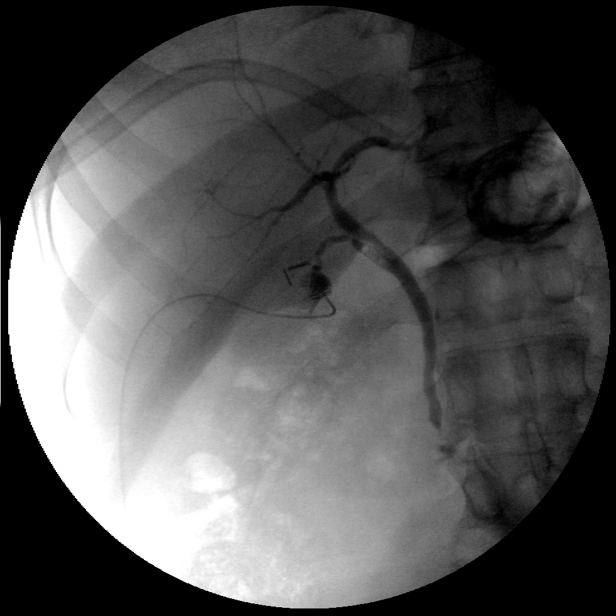
[im 3/3]
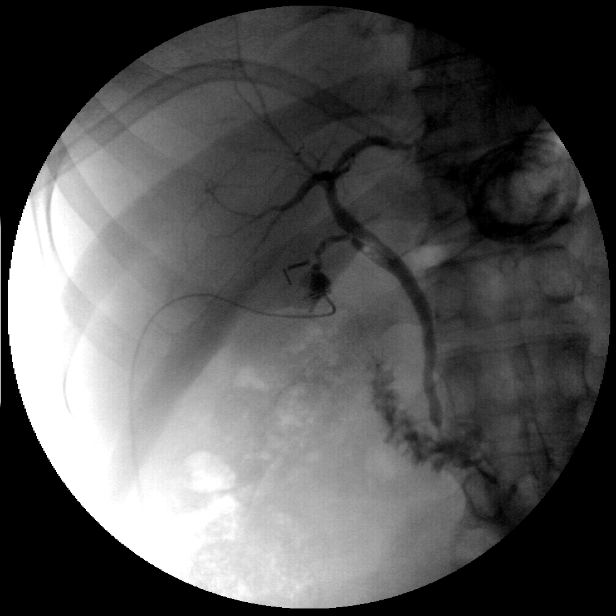
[im 3/3]
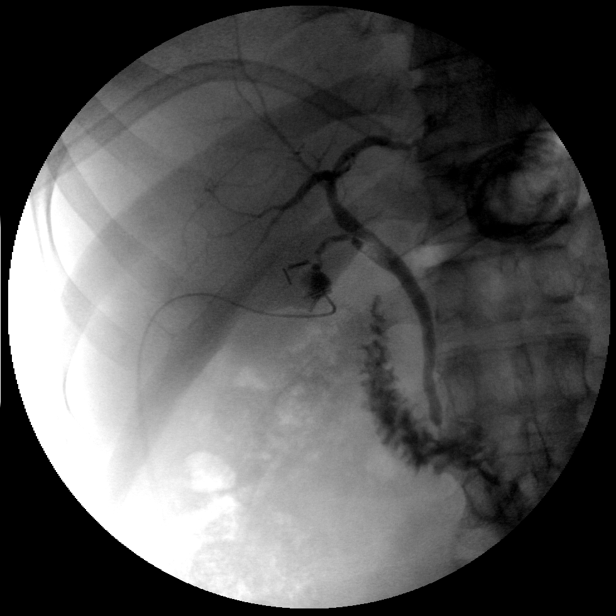
[im 3/3]
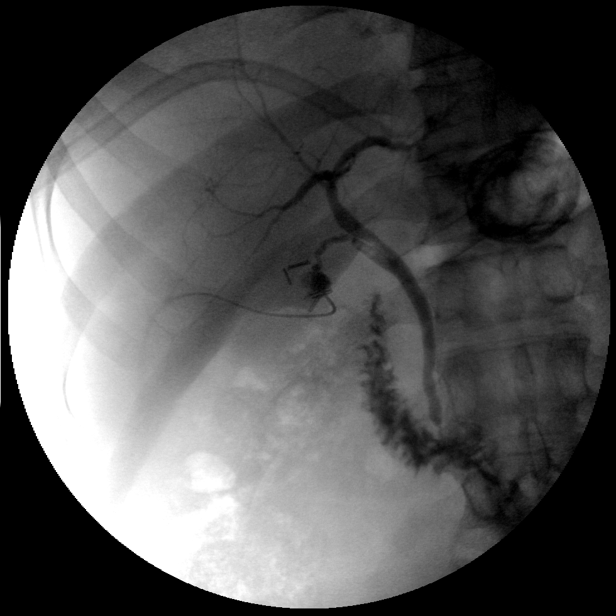

[12 of 12 positions shown; findings below may reference images not displayed]

FINDINGS: Limited intraoperative fluoroscopic images during intraoperative
cholecystectomy.

Surgical instruments of the upper abdomen.

Surgical clips at the inferior liver margin compatible with
cholecystectomy.

Cannulation of the cystic duct with antegrade contrast infusion
demonstrates partial opacification of the intrahepatic bile ducts,
common hepatic duct, common bile duct, and cystic duct. Final image
demonstrates contrast to traverse the ampulla into the duodenum.

There are small rounded filling defects at the distal cystic duct
which appear mobile.

Caliber of the common hepatic duct and common cystic duct
unremarkable. No filling defects within the common hepatic duct or
common bile duct.

No extraluminal contrast.
IMPRESSION: Intraoperative cholangiogram demonstrates unremarkable caliber of
the common hepatic duct and common bile duct, with contrast
traversing the ampulla into the duodenum.

There are several rounded mobile filling defects of the distal
cystic duct, potentially air bubbles or debris/ stones.

Please refer to the dictated operative report for full details of
intraoperative findings and procedure.

## 2014-12-16 SURGERY — LAPAROSCOPIC CHOLECYSTECTOMY WITH INTRAOPERATIVE CHOLANGIOGRAM
Anesthesia: General | Site: Abdomen

## 2014-12-16 MED ORDER — ONDANSETRON HCL 4 MG/2ML IJ SOLN
INTRAMUSCULAR | Status: AC
  Filled 2014-12-16: qty 2

## 2014-12-16 MED ORDER — LACTATED RINGERS IR SOLN
Status: DC | PRN
  Administered 2014-12-16: 1000 mL

## 2014-12-16 MED ORDER — FENTANYL CITRATE 0.05 MG/ML IJ SOLN: 25.0000 ug | INTRAMUSCULAR | Status: DC | PRN

## 2014-12-16 MED ORDER — BUPIVACAINE-EPINEPHRINE (PF) 0.5% -1:200000 IJ SOLN
INTRAMUSCULAR | Status: AC
  Filled 2014-12-16: qty 30

## 2014-12-16 MED ORDER — DEXAMETHASONE SODIUM PHOSPHATE 10 MG/ML IJ SOLN
INTRAMUSCULAR | Status: AC
  Filled 2014-12-16: qty 1

## 2014-12-16 MED ORDER — MIDAZOLAM HCL 2 MG/2ML IJ SOLN
INTRAMUSCULAR | Status: AC
  Filled 2014-12-16: qty 2

## 2014-12-16 MED ORDER — FENTANYL CITRATE 0.05 MG/ML IJ SOLN
INTRAMUSCULAR | Status: AC
  Filled 2014-12-16: qty 2

## 2014-12-16 MED ORDER — LACTATED RINGERS IV SOLN
INTRAVENOUS | Status: DC | PRN
  Administered 2014-12-16 (×2): via INTRAVENOUS

## 2014-12-16 MED ORDER — FENTANYL CITRATE 0.05 MG/ML IJ SOLN
25.0000 ug | INTRAMUSCULAR | Status: DC | PRN
  Administered 2014-12-16 (×2): 50 ug via INTRAVENOUS

## 2014-12-16 MED ORDER — CEFAZOLIN SODIUM-DEXTROSE 2-3 GM-% IV SOLR
2.0000 g | INTRAVENOUS | Status: AC
  Administered 2014-12-16: 2 g via INTRAVENOUS

## 2014-12-16 MED ORDER — NEOSTIGMINE METHYLSULFATE 10 MG/10ML IV SOLN
INTRAVENOUS | Status: DC | PRN
  Administered 2014-12-16: 4 mg via INTRAVENOUS

## 2014-12-16 MED ORDER — PROMETHAZINE HCL 25 MG/ML IJ SOLN: 6.2500 mg | INTRAMUSCULAR | Status: DC | PRN

## 2014-12-16 MED ORDER — IOHEXOL 300 MG/ML  SOLN
INTRAMUSCULAR | Status: DC | PRN
  Administered 2014-12-16: 16 mL

## 2014-12-16 MED ORDER — MIDAZOLAM HCL 5 MG/5ML IJ SOLN
INTRAMUSCULAR | Status: DC | PRN
  Administered 2014-12-16: 2 mg via INTRAVENOUS

## 2014-12-16 MED ORDER — LIDOCAINE HCL (CARDIAC) 20 MG/ML IV SOLN
INTRAVENOUS | Status: DC | PRN
  Administered 2014-12-16: 50 mg via INTRAVENOUS

## 2014-12-16 MED ORDER — PROPOFOL 10 MG/ML IV BOLUS
INTRAVENOUS | Status: DC | PRN
  Administered 2014-12-16: 180 mg via INTRAVENOUS

## 2014-12-16 MED ORDER — ACETAMINOPHEN 325 MG PO TABS: 650.0000 mg | ORAL_TABLET | ORAL | Status: DC | PRN

## 2014-12-16 MED ORDER — SODIUM CHLORIDE 0.9 % IJ SOLN: 3.0000 mL | INTRAMUSCULAR | Status: DC | PRN

## 2014-12-16 MED ORDER — HYDROCODONE-ACETAMINOPHEN 5-325 MG PO TABS: 1.0000 | ORAL_TABLET | Freq: Four times a day (QID) | ORAL | Status: DC | PRN

## 2014-12-16 MED ORDER — ONDANSETRON HCL 4 MG/2ML IJ SOLN
INTRAMUSCULAR | Status: DC | PRN
  Administered 2014-12-16: 4 mg via INTRAVENOUS

## 2014-12-16 MED ORDER — SODIUM CHLORIDE 0.9 % IV SOLN: 250.0000 mL | INTRAVENOUS | Status: DC | PRN

## 2014-12-16 MED ORDER — LACTATED RINGERS IV SOLN: INTRAVENOUS | Status: DC

## 2014-12-16 MED ORDER — 0.9 % SODIUM CHLORIDE (POUR BTL) OPTIME
TOPICAL | Status: DC | PRN
  Administered 2014-12-16: 1000 mL

## 2014-12-16 MED ORDER — BUPIVACAINE-EPINEPHRINE 0.5% -1:200000 IJ SOLN
INTRAMUSCULAR | Status: DC | PRN
  Administered 2014-12-16: 20 mL

## 2014-12-16 MED ORDER — DEXAMETHASONE SODIUM PHOSPHATE 10 MG/ML IJ SOLN
INTRAMUSCULAR | Status: DC | PRN
  Administered 2014-12-16: 10 mg via INTRAVENOUS

## 2014-12-16 MED ORDER — ACETAMINOPHEN 650 MG RE SUPP
650.0000 mg | RECTAL | Status: DC | PRN
  Filled 2014-12-16: qty 1

## 2014-12-16 MED ORDER — CEFAZOLIN SODIUM-DEXTROSE 2-3 GM-% IV SOLR
INTRAVENOUS | Status: AC
  Filled 2014-12-16: qty 50

## 2014-12-16 MED ORDER — SUCCINYLCHOLINE CHLORIDE 20 MG/ML IJ SOLN
INTRAMUSCULAR | Status: DC | PRN
  Administered 2014-12-16: 100 mg via INTRAVENOUS

## 2014-12-16 MED ORDER — PROPOFOL 10 MG/ML IV BOLUS
INTRAVENOUS | Status: AC
  Filled 2014-12-16: qty 20

## 2014-12-16 MED ORDER — GLYCOPYRROLATE 0.2 MG/ML IJ SOLN
INTRAMUSCULAR | Status: DC | PRN
  Administered 2014-12-16: 0.6 mg via INTRAVENOUS

## 2014-12-16 MED ORDER — LIDOCAINE HCL (CARDIAC) 20 MG/ML IV SOLN
INTRAVENOUS | Status: AC
  Filled 2014-12-16: qty 5

## 2014-12-16 MED ORDER — SODIUM CHLORIDE 0.9 % IJ SOLN: 3.0000 mL | Freq: Two times a day (BID) | INTRAMUSCULAR | Status: DC

## 2014-12-16 MED ORDER — SODIUM CHLORIDE 0.9 % IV SOLN: INTRAVENOUS | Status: DC

## 2014-12-16 MED ORDER — FENTANYL CITRATE 0.05 MG/ML IJ SOLN
INTRAMUSCULAR | Status: DC | PRN
  Administered 2014-12-16: 150 ug via INTRAVENOUS
  Administered 2014-12-16: 100 ug via INTRAVENOUS

## 2014-12-16 MED ORDER — FENTANYL CITRATE 0.05 MG/ML IJ SOLN
INTRAMUSCULAR | Status: AC
  Filled 2014-12-16: qty 5

## 2014-12-16 MED ORDER — MEPERIDINE HCL 50 MG/ML IJ SOLN: 6.2500 mg | INTRAMUSCULAR | Status: DC | PRN

## 2014-12-16 MED ORDER — ROCURONIUM BROMIDE 100 MG/10ML IV SOLN
INTRAVENOUS | Status: DC | PRN
  Administered 2014-12-16: 5 mg via INTRAVENOUS
  Administered 2014-12-16: 35 mg via INTRAVENOUS

## 2014-12-16 MED ORDER — ROCURONIUM BROMIDE 100 MG/10ML IV SOLN
INTRAVENOUS | Status: AC
  Filled 2014-12-16: qty 1

## 2014-12-16 MED ORDER — OXYCODONE HCL 5 MG PO TABS
5.0000 mg | ORAL_TABLET | ORAL | Status: DC | PRN
  Administered 2014-12-16: 5 mg via ORAL
  Filled 2014-12-16: qty 1

## 2014-12-16 SURGICAL SUPPLY — 36 items
APPLIER CLIP ROT 10 11.4 M/L (STAPLE) ×2
BENZOIN TINCTURE PRP APPL 2/3 (GAUZE/BANDAGES/DRESSINGS) IMPLANT
CLIP APPLIE ROT 10 11.4 M/L (STAPLE) ×1 IMPLANT
COVER MAYO STAND STRL (DRAPES) ×2 IMPLANT
DECANTER SPIKE VIAL GLASS SM (MISCELLANEOUS) IMPLANT
DRAPE C-ARM 42X120 X-RAY (DRAPES) ×2 IMPLANT
DRAPE LAPAROSCOPIC ABDOMINAL (DRAPES) ×2 IMPLANT
ELECT REM PT RETURN 9FT ADLT (ELECTROSURGICAL) ×2
ELECTRODE REM PT RTRN 9FT ADLT (ELECTROSURGICAL) ×1 IMPLANT
GLOVE BIOGEL PI IND STRL 7.0 (GLOVE) ×1 IMPLANT
GLOVE BIOGEL PI IND STRL 7.5 (GLOVE) ×1 IMPLANT
GLOVE BIOGEL PI INDICATOR 7.0 (GLOVE) ×1
GLOVE BIOGEL PI INDICATOR 7.5 (GLOVE) ×1
GLOVE EUDERMIC 7 POWDERFREE (GLOVE) ×2 IMPLANT
GLOVE SURG SS PI 6.5 STRL IVOR (GLOVE) ×2 IMPLANT
GLOVE SURG SS PI 7.5 STRL IVOR (GLOVE) ×2 IMPLANT
GOWN STRL REIN 2XL XLG LVL4 (GOWN DISPOSABLE) ×2 IMPLANT
GOWN STRL REUS W/TWL XL LVL3 (GOWN DISPOSABLE) ×4 IMPLANT
HEMOSTAT SNOW SURGICEL 2X4 (HEMOSTASIS) IMPLANT
KIT BASIN OR (CUSTOM PROCEDURE TRAY) ×2 IMPLANT
LIQUID BAND (GAUZE/BANDAGES/DRESSINGS) ×2 IMPLANT
POUCH SPECIMEN RETRIEVAL 10MM (ENDOMECHANICALS) ×2 IMPLANT
SCISSORS LAP 5X35 DISP (ENDOMECHANICALS) ×2 IMPLANT
SET CHOLANGIOGRAPH MIX (MISCELLANEOUS) ×2 IMPLANT
SET IRRIG TUBING LAPAROSCOPIC (IRRIGATION / IRRIGATOR) ×2 IMPLANT
SLEEVE XCEL OPT CAN 5 100 (ENDOMECHANICALS) ×2 IMPLANT
SOLUTION ANTI FOG 6CC (MISCELLANEOUS) IMPLANT
STRIP CLOSURE SKIN 1/2X4 (GAUZE/BANDAGES/DRESSINGS) IMPLANT
SUT MNCRL AB 4-0 PS2 18 (SUTURE) ×2 IMPLANT
TOWEL OR 17X26 10 PK STRL BLUE (TOWEL DISPOSABLE) ×2 IMPLANT
TOWEL OR NON WOVEN STRL DISP B (DISPOSABLE) ×2 IMPLANT
TRAY LAPAROSCOPIC (CUSTOM PROCEDURE TRAY) ×2 IMPLANT
TROCAR BLADELESS OPT 5 100 (ENDOMECHANICALS) ×2 IMPLANT
TROCAR XCEL BLUNT TIP 100MML (ENDOMECHANICALS) ×2 IMPLANT
TROCAR XCEL NON-BLD 11X100MML (ENDOMECHANICALS) ×2 IMPLANT
TUBING INSUFFLATION 10FT LAP (TUBING) ×2 IMPLANT

## 2014-12-16 NOTE — Discharge Instructions (Signed)
[facility] ______[facility] °LAPAROSCOPIC SURGERY: POST OP INSTRUCTIONS °Always review your discharge instruction sheet given to you by the facility where your surgery was performed. °IF YOU HAVE DISABILITY OR FAMILY LEAVE FORMS, YOU MUST BRING THEM TO THE OFFICE FOR PROCESSING.   °DO NOT GIVE THEM TO YOUR DOCTOR. ° °1. A prescription for pain medication may be given to you upon discharge.  Take your pain medication as prescribed, if needed.  If narcotic pain medicine is not needed, then you may take acetaminophen (Tylenol) or ibuprofen (Advil) as needed. °2. Take your usually prescribed medications unless otherwise directed. °3. If you need a refill on your pain medication, please contact your pharmacy.  They will contact our office to request authorization. Prescriptions will not be filled after 5pm or on week-ends. °4. You should follow a light diet the first few days after arrival home, such as soup and crackers, etc.  Be sure to include lots of fluids daily. °5. Most patients will experience some swelling and bruising in the area of the incisions.  Ice packs will help.  Swelling and bruising can take several days to resolve.  °6. It is common to experience some constipation if taking pain medication after surgery.  Increasing fluid intake and taking a stool softener (such as Colace) will usually help or prevent this problem from occurring.  A mild laxative (Milk of Magnesia or Miralax) should be taken according to package instructions if there are no bowel movements after 48 hours. °7. Unless discharge instructions indicate otherwise, you may remove your bandages 24-48 hours after surgery, and you may shower at that time.  You may have steri-strips (small skin tapes) in place directly over the incision.  These strips should be left on the skin for 7-10 days.  If your surgeon used skin glue on the incision, you may shower in 24 hours.  The glue will flake off over the next 2-3 weeks.  Any sutures or  staples will be removed at the office during your follow-up visit. °8. ACTIVITIES:  You may resume regular (light) daily activities beginning the next day--such as daily self-care, walking, climbing stairs--gradually increasing activities as tolerated.  You may have sexual intercourse when it is comfortable.  Refrain from any heavy lifting or straining until approved by your doctor. °a. You may drive when you are no longer taking prescription pain medication, you can comfortably wear a seatbelt, and you can safely maneuver your car and apply brakes. °b. RETURN TO WORK:  __________________________________________________________ °9. You should see your doctor in the office for a follow-up appointment approximately 2-3 weeks after your surgery.  Make sure that you call for this appointment within a day or two after you arrive home to insure a convenient appointment time. °10. OTHER INSTRUCTIONS: __________________________________________________________________________________________________________________________ __________________________________________________________________________________________________________________________ °WHEN TO CALL YOUR DOCTOR: °1. Fever over 101.0 °2. Inability to urinate °3. Continued bleeding from incision. °4. Increased pain, redness, or drainage from the incision. °5. Increasing abdominal pain ° °The clinic staff is available to answer your questions during regular business hours.  Please don’t hesitate to call and ask to speak to one of the nurses for clinical concerns.  If you have a medical emergency, go to the nearest emergency room or call 911.  A surgeon from [facility] is always on call at the hospital. °[address], [address], [place], [place]  [address] ? [address] [address], [place], [place]   [address] °[phone] ? [phone] ? FAX [phone] °Web site:   [email or web address] °

## 2014-12-16 NOTE — Anesthesia Postprocedure Evaluation (Signed)
  Anesthesia Post-op Note  Patient: Rickey Cruz  Procedure(s) Performed: Procedure(s) (LRB): LAPAROSCOPIC CHOLECYSTECTOMY WITH INTRAOPERATIVE CHOLANGIOGRAM (N/A)  Patient Location: PACU  Anesthesia Type: General  Level of Consciousness: awake and alert   Airway and Oxygen Therapy: Patient Spontanous Breathing  Post-op Pain: mild  Post-op Assessment: Post-op Vital signs reviewed, Patient's Cardiovascular Status Stable, Respiratory Function Stable, Patent Airway and No signs of Nausea or vomiting  Last Vitals:  Filed Vitals:   12/16/14 1100  BP: 120/81  Pulse: 60  Temp:   Resp: 14    Post-op Vital Signs: stable   Complications: No apparent anesthesia complications

## 2014-12-16 NOTE — Interval H&P Note (Signed)
History and Physical Interval Note:  12/16/2014 7:01 AM  Rickey Cruz  has presented today for surgery, with the diagnosis of gallstones  The various methods of treatment have been discussed with the patient and family. After consideration of risks, benefits and other options for treatment, the patient has consented to  Procedure(s): LAPAROSCOPIC CHOLECYSTECTOMY WITH INTRAOPERATIVE CHOLANGIOGRAM POSSIBLE OPEN (N/A) as a surgical intervention .  The patient's history has been reviewed, patient examined, no change in status, stable for surgery.  I have reviewed the patient's chart and labs.  Questions were answered to the patient's satisfaction.     Adin Hector

## 2014-12-16 NOTE — Transfer of Care (Signed)
Immediate Anesthesia Transfer of Care Note  Patient: Rickey Cruz  Procedure(s) Performed: Procedure(s): LAPAROSCOPIC CHOLECYSTECTOMY WITH INTRAOPERATIVE CHOLANGIOGRAM (N/A)  Patient Location: PACU  Anesthesia Type:General  Level of Consciousness: awake, alert  and oriented  Airway & Oxygen Therapy: Patient Spontanous Breathing and Patient connected to face mask oxygen  Post-op Assessment: Report given to PACU RN and Post -op Vital signs reviewed and stable  Post vital signs: Reviewed and stable  Complications: No apparent anesthesia complications

## 2014-12-16 NOTE — Op Note (Signed)
Patient Name:           Rickey Cruz   Date of Surgery:        12/16/2014  Pre op Diagnosis:      Chronic cholecystitis with cholelithiasis  Post op Diagnosis:    Same  Procedure:                 Laparoscopic cholecystectomy with cholangiogram  Surgeon:                     Edsel Petrin. Dalbert Batman, M.D., FACS  Assistant:                      OR staff  Operative Indications:    This is a 53 year old [name], originally referred by Dr. [name]. He returns for further discussion and surgical planning for his symptomatic gallstones. He has had some intermittent attacks of epigastric fullness and bloating over the past few years. On July 4 he had another episode of severe epigastric pain nausea and repeated bouts of vomiting. He went to the emergency room where an ultrasound showed multiple gallstones but no inflammation. AST 103. A LT 310. Other liver function tests normal. Lipase normal. Glucose 170. RPR negative. HIV negative. He lost his insurance and so held off on the gallbladder surgery. He has had less attacks.  he is afraid that he is going to have problems with his liver or pancreas and wants to have the cholecystectomy now that he has his insurance back. Crestor was discontinued and his liver function tests have normalized. He recently saw Dr. Janice Norrie and had prostate biopsies because of an elevated PSA. Those results are pending. 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    Operative Findings:       The gallbladder was chronically inflamed. It was thin-walled, large, and had extensive omental adhesions almost completely obscuring the gallbladder except for the tip of the fundus. These were taken down without too much difficulty. The anatomy of the cystic duct and cystic artery were conventional. The intraoperative cholangiogram was normal, showing normal intrahepatic and extrahepatic biliary anatomy, no filling defect, no dilatation, and flow of contrast into the duodenum  without obstruction. The liver, stomach, duodenum, small intestine, large intestine were grossly normal to inspection. There were adhesions in the right lower quadrant from his previous appendectomy.  Procedure in Detail:          Following the induction of general endotracheal anesthesia the patient's abdomen was prepped and draped in a sterile fashion. Intravenous antibiotics were given. Surgical timeout was performed.       0.5% Marcaine with epinephrine was used as local infiltration anesthetic. A vertical incision was made in the upper rim of the umbilicus and the fascia was incised in the midline and the abdominal cavity entered under direct vision. A Hassan cannula was inserted and secured the pursestring suture of 0 Vicryl. Pneumoperitoneum was created and video camera inserted. An 11 mm trochar was placed in subxiphoid region and two 5 mm trochars placed in the right upper quadrant.      We elevated the fundus of the gallbladder. It took about 10 minutes or more to take down all of the soft chronic adhesions but ultimately we could identify the infundibulum and the neck of the gallbladder. We dissected out the cystic duct and cystic artery until we had a nice window behind the structures. Cystic artery was secured with metal clips and divided. The cholangiogram  catheter was inserted into the cystic duct and a cholangiogram obtained using the C-arm. The cholangiogram was normal as described above. The catheter was removed, the cystic duct secured with multiple metal  clips and divided. Gallbladder was dissected from its bed with electrocautery placed in a specimen bag and removed. The bed of the gallbladder was a little bit raw and was cauterized extensively. The subhepatic and subphrenic spaces were irrigated with saline and irrigation fluid became completely clear. There is no evidence of bleeding or bile leak. The trochars were removed and there was no bleeding. The pneumoperitoneum was released. The  fascia at the umbilicus was closed with 0 Vicryl sutures and the skin incisions closed with subcuticular sutures of 4-0 Monocryl and Dermabond. Patient tolerated the procedure well was taken to PACU in stable condition. EBL 20 mL. Counts correct. Complications none.      Edsel Petrin. Dalbert Batman, M.D., FACS General and Minimally Invasive Surgery Breast and Colorectal Surgery  12/16/2014 8:35 AM

## 2014-12-16 NOTE — Anesthesia Preprocedure Evaluation (Signed)
Anesthesia Evaluation  Patient identified by MRN, date of birth, ID band Patient awake    Reviewed: Allergy & Precautions, H&P , NPO status , Patient's Chart, lab work & pertinent test results  Airway Mallampati: II  TM Distance: >3 FB Neck ROM: Full    Dental no notable dental hx.    Pulmonary asthma , neg sleep apnea,  breath sounds clear to auscultation  Pulmonary exam normal       Cardiovascular hypertension, Pt. on medications Rhythm:Regular Rate:Normal     Neuro/Psych negative neurological ROS  negative psych ROS   GI/Hepatic negative GI ROS, Neg liver ROS,   Endo/Other  negative endocrine ROS  Renal/GU negative Renal ROS  negative genitourinary   Musculoskeletal negative musculoskeletal ROS (+)   Abdominal   Peds negative pediatric ROS (+)  Hematology negative hematology ROS (+)   Anesthesia Other Findings   Reproductive/Obstetrics negative OB ROS                             Anesthesia Physical Anesthesia Plan  ASA: II  Anesthesia Plan: General   Post-op Pain Management:    Induction: Intravenous  Airway Management Planned: Oral ETT  Additional Equipment:   Intra-op Plan:   Post-operative Plan: Extubation in OR  Informed Consent: I have reviewed the patients History and Physical, chart, labs and discussed the procedure including the risks, benefits and alternatives for the proposed anesthesia with the patient or authorized representative who has indicated his/her understanding and acceptance.   Dental advisory given  Plan Discussed with: CRNA  Anesthesia Plan Comments:         Anesthesia Quick Evaluation

## 2014-12-17 ENCOUNTER — Encounter (HOSPITAL_COMMUNITY): Payer: Self-pay | Admitting: General Surgery

## 2014-12-20 ENCOUNTER — Ambulatory Visit (INDEPENDENT_AMBULATORY_CARE_PROVIDER_SITE_OTHER): Payer: BC Managed Care – PPO | Admitting: Family Medicine

## 2014-12-20 VITALS — BP 129/86 | HR 86 | Temp 98.0°F | Resp 16 | Ht 67.0 in | Wt 176.4 lb

## 2014-12-20 DIAGNOSIS — R7302 Impaired glucose tolerance (oral): Secondary | ICD-10-CM

## 2014-12-20 DIAGNOSIS — J069 Acute upper respiratory infection, unspecified: Secondary | ICD-10-CM

## 2014-12-20 DIAGNOSIS — K8020 Calculus of gallbladder without cholecystitis without obstruction: Secondary | ICD-10-CM

## 2014-12-20 DIAGNOSIS — I10 Essential (primary) hypertension: Secondary | ICD-10-CM

## 2014-12-20 DIAGNOSIS — L409 Psoriasis, unspecified: Secondary | ICD-10-CM

## 2014-12-20 DIAGNOSIS — E785 Hyperlipidemia, unspecified: Secondary | ICD-10-CM

## 2014-12-20 MED ORDER — FLUOCINONIDE 0.05 % EX SOLN: 1.0000 "application " | Freq: Every day | CUTANEOUS | Status: DC | PRN

## 2014-12-20 MED ORDER — FLUOCINONIDE 0.1 % EX CREA: 1.0000 "application " | TOPICAL_CREAM | Freq: Every day | CUTANEOUS | Status: DC | PRN

## 2014-12-20 MED ORDER — AMOXICILLIN 500 MG PO CAPS: 1000.0000 mg | ORAL_CAPSULE | Freq: Two times a day (BID) | ORAL | Status: DC

## 2014-12-20 NOTE — Progress Notes (Signed)
This chart was scribed for Rickey Honour, MD by Einar Pheasant, ED Scribe. This patient was seen in room 2 and the patient's care was started at 4:19 PM.  Subjective:    Patient ID: Rickey Cruz, male    DOB: 28-Jan-1961, 53 y.o.   MRN: [id number]  12/20/2014  Follow-up and 3 month check up   HPI Rickey Cruz is a 53 y.o. male with a PMhx of HTN and hyperlipidemia.   Pt is here in the office today, for a follow up and 3 month check up. Pt states that he has been checking his blood pressure regularly with good numbers. However, he is not able to specify the exact readings. He states that he has been losing weight which is a good thing.   Elevated PSA:  Pt was referred to a local urologist to get a biopsy of his prostate. He states that a growth was found but there were no conclusive readings on it, so he was told to wait 3 more months for a definitive reading and repeat biopsies.  Pt is also complaining of a gradual onset productive cough with green sputum. He states that the cough started after surgery (cholecystectomy) this week. He is also complaining of a right sided sore throat and chills. Pt endorses associated rhinorrhea and SOB. Rickey Cruz states that he had to force himself to breathe to get back to normal. Overall he states that he is doing well. He has been drinking a lot of fluids and walking more. He plans to return to a normal diet in the next couple of weeks. Denies any fever, diaphoresis, palpitations, chest pain, SOB, wheezing, nausea, emesis, or abdominal pain.   Pt is also requesting a refill on 0.1% fluocinonide, which is an ointment cream he uses for his psoriasis. He also requesting the lotion and oil as well.   Rickey Cruz is scheduled to return to the office for blood work and Hep A/Hep B vaccination December 28, 2013.   Glucose intolerance: continues to work on weight loss and exercise and low-sugar food choices.    Hyperlipidemia: stopped Crestor in July 2015 due to elevated  LFTs; now s/p cholecystectomy and ready to restart medication if needed.  Last LFTs during surgery were normal.   Review of Systems  Constitutional: Positive for chills. Negative for fever and diaphoresis.  HENT: Positive for congestion, postnasal drip, rhinorrhea, sinus pressure, sore throat, trouble swallowing and voice change. Negative for ear pain.   Respiratory: Positive for cough. Negative for shortness of breath, wheezing and stridor.   Cardiovascular: Negative for chest pain, palpitations and leg swelling.  Gastrointestinal: Negative for nausea, vomiting, abdominal pain and diarrhea.  Endocrine: Negative for cold intolerance, heat intolerance, polydipsia, polyphagia and polyuria.  Skin: Negative for rash.  Neurological: Positive for headaches. Negative for dizziness, tremors, seizures, syncope, facial asymmetry, speech difficulty, weakness, light-headedness and numbness.    Past Medical History  Diagnosis Date  . Asthma     seasonal, with allergies  . Hyperlipidemia   . Hypertension   . Psoriasis     Rickey Cruz/dermatology  . History of kidney stones     x1  . Colon polyp 10/27/2011  . Allergy     Flonase, Patanol, Zyrtec  . Arthritis     psoriasis  . 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 Low back pain     "in kidney area-was told that it was related to gallstones"  . Depression   .  GERD (gastroesophageal reflux disease)   . Borderline diabetes    Past Surgical History  Procedure Laterality Date  . Colonoscopy  10/27/2011    single polyp. Repeat 5 years.  . Polypectomy    . Septoplasty  2014  . Prostate biopsy  10/2014    will have another in 3 months  . Appendectomy    . Tonsillectomy  2014  . Uvulopalatopharyngoplasty  2014  . Cholecystectomy N/A 12/16/2014    Procedure: LAPAROSCOPIC CHOLECYSTECTOMY WITH INTRAOPERATIVE CHOLANGIOGRAM;  Surgeon: Rickey Skates, MD;  Location: [facility];  Service: General;  Laterality: N/A;   No Known  Allergies Current Outpatient Prescriptions  Medication Sig Dispense Refill  . amLODipine (NORVASC) 5 MG tablet TAKE 1 TABLET BY MOUTH EVERY DAY 90 tablet 0  . clobetasol (OLUX) 0.05 % topical foam Apply 1 application topically 2 (two) times daily.    . clobetasol ointment (TEMOVATE) 3.23 % Apply 1 application topically 2 (two) times daily.    Marland Kitchen desonide (DESOWEN) 0.05 % cream Apply topically 2 (two) times daily. 60 g 2  . fluticasone (FLONASE) 50 MCG/ACT nasal spray Place 2 sprays into both nostrils daily. (Patient taking differently: Place 2 sprays into both nostrils daily as needed for allergies. ) 16 g 11  . hydrochlorothiazide (HYDRODIURIL) 12.5 MG tablet TAKE 1 TABLET BY MOUTH EVERY DAY 90 tablet 0  . amoxicillin (AMOXIL) 500 MG capsule Take 2 capsules (1,000 mg total) by mouth 2 (two) times daily. 40 capsule 0  . fluocinonide (LIDEX) 0.05 % external solution Apply 1 application topically daily as needed. 60 mL 2  . Fluocinonide 0.1 % CREA Apply 1 application topically daily as needed. 60 g 2   No current facility-administered medications for this visit.       Objective:    Triage Vitals:BP 129/86 mmHg  Pulse 86  Temp(Src) 98 F (36.7 C) (Oral)  Resp 16  Ht 5\' 7"  (1.702 m)  Wt 176 lb 6.4 oz (80.015 kg)  BMI 27.62 kg/m2  SpO2 97%  Physical Exam  Constitutional: He is oriented to person, place, and time. He appears well-developed and well-nourished. No distress.  HENT:  Head: Normocephalic and atraumatic.  Right Ear: External ear normal.  Left Ear: External ear normal.  Nose: Rhinorrhea present. Right sinus exhibits maxillary sinus tenderness and frontal sinus tenderness. Left sinus exhibits frontal sinus tenderness. Left sinus exhibits no maxillary sinus tenderness.  Mouth/Throat: Posterior oropharyngeal erythema present. No oropharyngeal exudate, posterior oropharyngeal edema or tonsillar abscesses.  Eyes: Conjunctivae and EOM are normal. Pupils are equal, round, and  reactive to light.  Neck: Normal range of motion. Neck supple. Carotid bruit is not present. No thyromegaly present.  Cardiovascular: Normal rate, regular rhythm, normal heart sounds and intact distal pulses.  Exam reveals no gallop and no friction rub.   No murmur heard. No carotid bruits.   Pulmonary/Chest: Effort normal and breath sounds normal. No respiratory distress. He has no wheezes. He has no rales. He exhibits no tenderness.  Abdominal: Soft. Bowel sounds are normal. He exhibits no distension and no mass. There is no tenderness. There is no rebound and no guarding.  Well healing surgical incisions.  Musculoskeletal: Normal range of motion.  Lymphadenopathy:    He has no cervical adenopathy.  Neurological: He is alert and oriented to person, place, and time. No cranial nerve deficit.  Skin: Skin is warm and dry. No rash noted. He is not diaphoretic.  Psychiatric: He has a normal mood and affect. His behavior  is normal.  Nursing note and vitals reviewed.  Results for orders placed or performed during the hospital encounter of 12/15/14  CBC WITH DIFFERENTIAL  Result Value Ref Range   WBC 6.5 4.0 - 10.5 K/uL   RBC 5.59 4.22 - 5.81 MIL/uL   Hemoglobin 17.1 (H) 13.0 - 17.0 g/dL   HCT 50.1 39.0 - 52.0 %   MCV 89.6 78.0 - 100.0 fL   MCH 30.6 26.0 - 34.0 pg   MCHC 34.1 30.0 - 36.0 g/dL   RDW 12.2 11.5 - 15.5 %   Platelets 293 150 - 400 K/uL   Neutrophils Relative % 71 43 - 77 %   Neutro Abs 4.6 1.7 - 7.7 K/uL   Lymphocytes Relative 19 12 - 46 %   Lymphs Abs 1.2 0.7 - 4.0 K/uL   Monocytes Relative 9 3 - 12 %   Monocytes Absolute 0.6 0.1 - 1.0 K/uL   Eosinophils Relative 1 0 - 5 %   Eosinophils Absolute 0.1 0.0 - 0.7 K/uL   Basophils Relative 0 0 - 1 %   Basophils Absolute 0.0 0.0 - 0.1 K/uL  Comprehensive metabolic panel  Result Value Ref Range   Sodium 144 137 - 147 mEq/L   Potassium 4.0 3.7 - 5.3 mEq/L   Chloride 101 96 - 112 mEq/L   CO2 30 19 - 32 mEq/L   Glucose, Bld  106 (H) 70 - 99 mg/dL   BUN 12 6 - 23 mg/dL   Creatinine, Ser 1.02 0.50 - 1.35 mg/dL   Calcium 10.0 8.4 - 10.5 mg/dL   Total Protein 7.7 6.0 - 8.3 g/dL   Albumin 4.1 3.5 - 5.2 g/dL   AST 18 0 - 37 U/L   ALT 23 0 - 53 U/L   Alkaline Phosphatase 58 39 - 117 U/L   Total Bilirubin 0.5 0.3 - 1.2 mg/dL   GFR calc non Af Amer 82 (L) >90 mL/min   GFR calc Af Amer >90 >90 mL/min   Anion gap 13 5 - 15       Assessment & Plan:   1. Psoriasis   2. Essential hypertension   3. Calculus of gallbladder without cholecystitis without obstruction   4. Hyperlipidemia   5. Glucose intolerance (impaired glucose tolerance)   6. Acute upper respiratory infection     1. Psoriasis: stable; refills provided. 2.  HTN: controlled; recent CMET normal; refills provided; follow-up six months. 3.  Cholelithiasis: improved; s/p cholecystectomy this week with good post-operative period. 4.  Hyperlipidemia: uncontrolled; has been holding statin for six months; repeat FLP in upcoming week; will restart Crestor if levels remain elevated.  5.  Glucose intolerance: stable; obtain HgbA1c in upcoming week. 6.  URI:  New. With recent admission for surgery; will empirically treat with Amoxicillin. 7.  Immunizations: will return in upcoming two weeks for Hepatitis A#2 and Hepatitis B#3.   Meds ordered this encounter  Medications  . amoxicillin (AMOXIL) 500 MG capsule    Sig: Take 2 capsules (1,000 mg total) by mouth 2 (two) times daily.    Dispense:  40 capsule    Refill:  0  . Fluocinonide 0.1 % CREA    Sig: Apply 1 application topically daily as needed.    Dispense:  60 g    Refill:  2  . fluocinonide (LIDEX) 0.05 % external solution    Sig: Apply 1 application topically daily as needed.    Dispense:  60 mL    Refill:  2  Return in about 6 months (around 06/21/2015) for recheck.    I personally performed the services described in this documentation, which was scribed in my presence. The recorded  information has been reviewed and considered.   Darby Shadwick Elayne Guerin, M.D. [facility] [address] [place], [place]  [address] [phone] phone [phone] fax

## 2014-12-23 DIAGNOSIS — E785 Hyperlipidemia, unspecified: Secondary | ICD-10-CM | POA: Insufficient documentation

## 2014-12-23 DIAGNOSIS — R7302 Impaired glucose tolerance (oral): Secondary | ICD-10-CM | POA: Insufficient documentation

## 2014-12-23 MED ORDER — AMLODIPINE BESYLATE 5 MG PO TABS: 5.0000 mg | ORAL_TABLET | Freq: Every day | ORAL | Status: DC

## 2014-12-23 MED ORDER — HYDROCHLOROTHIAZIDE 12.5 MG PO TABS: 12.5000 mg | ORAL_TABLET | Freq: Every day | ORAL | Status: DC

## 2014-12-28 ENCOUNTER — Other Ambulatory Visit (INDEPENDENT_AMBULATORY_CARE_PROVIDER_SITE_OTHER): Payer: BLUE CROSS/BLUE SHIELD | Admitting: Radiology

## 2014-12-28 DIAGNOSIS — E785 Hyperlipidemia, unspecified: Secondary | ICD-10-CM

## 2014-12-28 DIAGNOSIS — R7302 Impaired glucose tolerance (oral): Secondary | ICD-10-CM

## 2014-12-28 DIAGNOSIS — Z23 Encounter for immunization: Secondary | ICD-10-CM

## 2014-12-28 LAB — LIPID PANEL
CHOL/HDL RATIO: 6.2 ratio
Cholesterol: 228 mg/dL — ABNORMAL HIGH (ref 0–200)
HDL: 37 mg/dL — AB (ref >39)
LDL Cholesterol: 118 mg/dL — ABNORMAL HIGH (ref 0–99)
TRIGLYCERIDES: 363 mg/dL — AB (ref <150)
VLDL: 73 mg/dL — ABNORMAL HIGH (ref 0–40)

## 2014-12-28 LAB — HEMOGLOBIN A1C
HEMOGLOBIN A1C: 6.1 % — AB (ref <5.7)
Mean Plasma Glucose: 128 mg/dL — ABNORMAL HIGH (ref <117)

## 2014-12-29 ENCOUNTER — Ambulatory Visit: Payer: BC Managed Care – PPO | Admitting: Family Medicine

## 2014-12-30 ENCOUNTER — Other Ambulatory Visit: Payer: Self-pay | Admitting: Family Medicine

## 2014-12-30 DIAGNOSIS — E7800 Pure hypercholesterolemia, unspecified: Secondary | ICD-10-CM

## 2014-12-30 MED ORDER — ROSUVASTATIN CALCIUM 10 MG PO TABS: 10.0000 mg | ORAL_TABLET | Freq: Every day | ORAL | Status: DC

## 2015-02-04 ENCOUNTER — Other Ambulatory Visit (HOSPITAL_COMMUNITY): Payer: Self-pay | Admitting: Urology

## 2015-02-04 DIAGNOSIS — R972 Elevated prostate specific antigen [PSA]: Secondary | ICD-10-CM

## 2015-02-25 ENCOUNTER — Other Ambulatory Visit: Payer: Self-pay | Admitting: Sports Medicine

## 2015-02-25 ENCOUNTER — Ambulatory Visit (INDEPENDENT_AMBULATORY_CARE_PROVIDER_SITE_OTHER): Payer: BC Managed Care – PPO | Admitting: Sports Medicine

## 2015-02-25 VITALS — BP 110/70 | HR 68 | Temp 98.3°F | Ht 66.0 in | Wt 175.0 lb

## 2015-02-25 DIAGNOSIS — H00016 Hordeolum externum left eye, unspecified eyelid: Secondary | ICD-10-CM

## 2015-02-25 DIAGNOSIS — J4520 Mild intermittent asthma, uncomplicated: Secondary | ICD-10-CM | POA: Diagnosis not present

## 2015-02-25 DIAGNOSIS — H0016 Chalazion left eye, unspecified eyelid: Secondary | ICD-10-CM | POA: Diagnosis not present

## 2015-02-25 MED ORDER — MONTELUKAST SODIUM 10 MG PO TABS: 10.0000 mg | ORAL_TABLET | Freq: Every day | ORAL | Status: DC

## 2015-02-25 MED ORDER — ERYTHROMYCIN 2 % EX OINT: 1.0000 [drp] | TOPICAL_OINTMENT | Freq: Two times a day (BID) | CUTANEOUS | Status: DC

## 2015-02-25 NOTE — Progress Notes (Signed)
  Rickey Cruz - 54 y.o. male MRN [id number]  Date of birth: 12-Feb-1961  SUBJECTIVE:  Including CC & ROS.  patient C/O: swelling and redness of the left lower eye lid Onset of symptoms: 1 week ago Symptoms:  Swelling, redness, pus drainage with expression yesterday and this morning. No surrounding redness on the orbit or upper eye lid Relieving factors:  Has been using warm compression with some improvement Worsened by: nothing just no improvement   ROS:  Constitutional:  No fever, chills, or fatigue.  Respiratory:  No shortness of breath, cough, or wheezing Cardiovascular:  No palpitations, chest pain or syncope Gastrointestinal:  No nausea, no abdominal pain Review of systems otherwise negative except for what is stated in HPI  HISTORY: Past Medical, Surgical, Social, and Family History Reviewed & Updated per EMR. Pertinent Historical Findings include: Asthma and chronic allergic rhinitis HTN Pulmonary HTN Psoriasis HLD  PHYSICAL EXAM:  VS: BP:110/70 mmHg  HR:68bpm  TEMP:98.3 F (36.8 C)(Oral)  RESP:96 %  HT:5\' 6"  (167.6 cm)   WT:175 lb (79.379 kg)  BMI:28.3 PHYSICAL EXAM: General:  Alert and oriented, No acute distress.   HENT:  Normocephalic, Oral mucosa is moist.   Eye exam: Patient has normal eye motion in all directions. Pupils are equal and reactive to light and accommodating. Left lower eyelid has a localized swelling, erythema, and pustule of the eyelash follicles consistent with Hordeolum no surrounding periorbital cellulitis present. Neurologic:  Alert, Oriented, No focal defects Psychiatric:  Cooperative, Appropriate mood & affect.    ASSESSMENT & PLAN:  Impression: Left lower eye lid Hordeolum   Recommendations: Continue warm compression and expression with clean towel Started BID dose of Erythromycin 2% ointment for 1 week Patient educated about signs of worsening swelling and  Periorbital cellulitis  patient will follow-up when necessary   Refilled  patient's singular  Wound procedure: Date: 02/25/15  Confirmed: patient, side, site, safety procedures followed.    Performed by: Birdie Hopes MARIE DO.    Informed consent:verbalized by patient  Indication: Hordeolum left lower eye lid Location: left lower eye lid Preparation and technique: skin prepped with soap and water, sterile preparation of site gloves, local anesthesia none used, small  Puncture was made to the wound with a 25-gauge 5 inch inch needle, wound irrigation with low pressure and expressing pus. Wound culture obtained    Wound assessment:: characteristics (erythematous, small amount of bloody purulent) discharge  Procedure tolerated: fairly.

## 2015-03-01 LAB — WOUND CULTURE
GRAM STAIN: NONE SEEN
Gram Stain: NONE SEEN

## 2015-03-08 ENCOUNTER — Other Ambulatory Visit: Payer: Self-pay | Admitting: Family Medicine

## 2015-03-20 ENCOUNTER — Ambulatory Visit (HOSPITAL_COMMUNITY)
Admission: RE | Admit: 2015-03-20 | Discharge: 2015-03-20 | Disposition: A | Discharge status: Home / Self Care | Payer: BC Managed Care – PPO | Source: Ambulatory Visit | Attending: Urology | Admitting: Urology

## 2015-03-20 DIAGNOSIS — R972 Elevated prostate specific antigen [PSA]: Secondary | ICD-10-CM | POA: Insufficient documentation

## 2015-03-20 DIAGNOSIS — N400 Enlarged prostate without lower urinary tract symptoms: Secondary | ICD-10-CM | POA: Insufficient documentation

## 2015-03-20 LAB — POCT I-STAT CREATININE: Creatinine, Ser: 0.9 mg/dL (ref 0.50–1.35)

## 2015-03-20 MED ORDER — GADOBENATE DIMEGLUMINE 529 MG/ML IV SOLN
20.0000 mL | Freq: Once | INTRAVENOUS | Status: AC | PRN
  Administered 2015-03-20: 16 mL via INTRAVENOUS

## 2015-03-21 ENCOUNTER — Other Ambulatory Visit (HOSPITAL_COMMUNITY): Payer: BC Managed Care – PPO

## 2015-05-21 ENCOUNTER — Ambulatory Visit (INDEPENDENT_AMBULATORY_CARE_PROVIDER_SITE_OTHER): Payer: BC Managed Care – PPO | Admitting: Emergency Medicine

## 2015-05-21 VITALS — BP 124/70 | HR 81 | Temp 98.6°F | Resp 18 | Ht 66.5 in | Wt 184.0 lb

## 2015-05-21 DIAGNOSIS — H109 Unspecified conjunctivitis: Secondary | ICD-10-CM

## 2015-05-21 MED ORDER — POLYMYXIN B-TRIMETHOPRIM 10000-0.1 UNIT/ML-% OP SOLN: 1.0000 [drp] | OPHTHALMIC | Status: DC

## 2015-05-21 NOTE — Patient Instructions (Signed)

## 2015-05-21 NOTE — Progress Notes (Signed)
Subjective:  Patient ID: Rickey Cruz, male    DOB: 1961/02/27  Age: 54 y.o. MRN: [id number]  CC: Eye Problem and Stye   HPI [name] presents for evaluation treatment of conjunctivitis. He said that over the last several days as his sclerae have been increasing more injected. He has no pain in his eyes. He has no visual symptoms. He has no nasal congestion postnasal drainage cough fever or chills. He has no history of foreign body in his eyes. No improvement with over-the-counter medication.  Outpatient Prescriptions Prior to Visit  Medication Sig Dispense Refill  . amLODipine (NORVASC) 5 MG tablet Take 1 tablet (5 mg total) by mouth daily. 90 tablet 1  . clobetasol (OLUX) 0.05 % topical foam APPLY TOPICALLY TWICE DAILY. 100 g 0  . clobetasol ointment (TEMOVATE) 6.29 % Apply 1 application topically 2 (two) times daily.    Marland Kitchen desonide (DESOWEN) 0.05 % cream Apply topically 2 (two) times daily. 60 g 2  . Erythromycin 2 % ointment Apply 1 drop topically 2 (two) times daily. For 10 days 25 g 0  . fluocinonide (LIDEX) 0.05 % external solution Apply 1 application topically daily as needed. 60 mL 2  . Fluocinonide 0.1 % CREA Apply 1 application topically daily as needed. 60 g 2  . fluticasone (FLONASE) 50 MCG/ACT nasal spray Place 2 sprays into both nostrils daily. (Patient taking differently: Place 2 sprays into both nostrils daily as needed for allergies. ) 16 g 11  . hydrochlorothiazide (HYDRODIURIL) 12.5 MG tablet Take 1 tablet (12.5 mg total) by mouth daily. 90 tablet 1  . montelukast (SINGULAIR) 10 MG tablet Take 1 tablet (10 mg total) by mouth at bedtime. 30 tablet 3  . rosuvastatin (CRESTOR) 10 MG tablet Take 1 tablet (10 mg total) by mouth daily. 90 tablet 3  . clobetasol (OLUX) 0.05 % topical foam Apply 1 application topically 2 (two) times daily.     No facility-administered medications prior to visit.    History   Social History  . Marital Status: Divorced    Spouse Name: N/A    . Number of Children: 1  . Years of Education: N/A   Occupational History  . TEACHER     [employer]   Social History Main Topics  . Smoking status: Never Smoker   . Smokeless tobacco: Never Used  . Alcohol Use: No  . Drug Use: No  . Sexual Activity: Yes     Comment: bisexual   Other Topics Concern  . None   Social History Narrative   Original from [place]    Marital status: separated x 1 year after 13 years of marriage; not dating.   Children:  1 child (15).   Lives: with roommate; sees son daily   Employment: Pharmacist, hospital; transition from Administration to teaching again in 2015; moved from [place], [place] in 2015; [employer] Spanish teacher              Family History  Problem Relation Age of Onset  . Colon cancer Neg Hx   . Esophageal cancer Neg Hx   . Stomach cancer Neg Hx   . Prostate cancer Neg Hx   . Diabetes Neg Hx   . CAD Neg Hx     Past Medical History  Diagnosis Date  . Asthma     seasonal, with allergies  . Hyperlipidemia   . Hypertension   . Psoriasis     [name]  . History of kidney  stones     x1  . Colon polyp 10/27/2011  . Allergy     Flonase, Patanol, Zyrtec  . Arthritis     psoriasis  . 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 Low back pain     "in kidney area-was told that it was related to gallstones"  . Depression   . GERD (gastroesophageal reflux disease)   . Borderline diabetes      Review of Systems  Constitutional: Negative for fever, chills and appetite change.  HENT: Negative for congestion, ear pain, postnasal drip, sinus pressure and sore throat.   Eyes: Positive for discharge and redness. Negative for pain.  Respiratory: Negative for cough, shortness of breath and wheezing.   Cardiovascular: Negative for leg swelling.  Gastrointestinal: Negative for nausea, vomiting, abdominal pain, diarrhea, constipation and blood in stool.  Endocrine: Negative for polyuria.   Genitourinary: Negative for dysuria, urgency, frequency and flank pain.  Musculoskeletal: Negative for gait problem.  Skin: Negative for rash.  Neurological: Negative for weakness and headaches.  Psychiatric/Behavioral: Negative for confusion and decreased concentration. The patient is not nervous/anxious.     Objective:  BP 124/70 mmHg  Pulse 81  Temp(Src) 98.6 F (37 C) (Oral)  Resp 18  Ht 5' 6.5" (1.689 m)  Wt 184 lb (83.462 kg)  BMI 29.26 kg/m2  SpO2 98%  BP Readings from Last 3 Encounters:  05/21/15 124/70  02/25/15 110/70  12/20/14 129/86    Wt Readings from Last 3 Encounters:  05/21/15 184 lb (83.462 kg)  02/25/15 175 lb (79.379 kg)  12/20/14 176 lb 6.4 oz (80.015 kg)    Physical Exam  Constitutional: He is oriented to person, place, and time. He appears well-developed and well-nourished.  HENT:  Head: Normocephalic and atraumatic.  Eyes: Pupils are equal, round, and reactive to light. Lids are everted and swept, no foreign bodies found. Right eye exhibits discharge. Left eye exhibits discharge. Right conjunctiva is injected. Left conjunctiva is injected.  Pulmonary/Chest: Effort normal.  Musculoskeletal: He exhibits no edema.  Neurological: He is alert and oriented to person, place, and time.  Skin: Skin is dry.  Psychiatric: He has a normal mood and affect. His behavior is normal. Thought content normal.    Lab Results  Component Value Date   WBC 6.5 12/15/2014   HGB 17.1* 12/15/2014   HCT 50.1 12/15/2014   PLT 293 12/15/2014   GLUCOSE 106* 12/15/2014   CHOL 228* 12/28/2014   TRIG 363* 12/28/2014   HDL 37* 12/28/2014   LDLCALC 118* 12/28/2014   ALT 23 12/15/2014   AST 18 12/15/2014   NA 144 12/15/2014   K 4.0 12/15/2014   CL 101 12/15/2014   CREATININE 0.90 03/20/2015   BUN 12 12/15/2014   CO2 30 12/15/2014   TSH 2.403 06/27/2014   PSA 4.44* 09/29/2014   INR 1.1 ratio* 06/10/2010   HGBA1C 6.1* 12/28/2014      .  Assessment & Plan:    Jonell was seen today for eye problem and stye.  Diagnoses and all orders for this visit:  Bilateral conjunctivitis  Other orders -     trimethoprim-polymyxin b (POLYTRIM) ophthalmic solution; Place 1 drop into both eyes every 4 (four) hours.   I am having Mr. Waldo start on trimethoprim-polymyxin b. I am also having him maintain his desonide, fluticasone, clobetasol ointment, Fluocinonide, fluocinonide, amLODipine, hydrochlorothiazide, rosuvastatin, montelukast, Erythromycin, and clobetasol.  Meds ordered this encounter  Medications  . trimethoprim-polymyxin  b (POLYTRIM) ophthalmic solution    Sig: Place 1 drop into both eyes every 4 (four) hours.    Dispense:  10 mL    Refill:  0     Follow-up: Return if symptoms worsen or fail to improve.  Roselee Culver, MD

## 2015-05-21 NOTE — Progress Notes (Deleted)
Subjective:  Patient ID: Rickey Cruz, male    DOB: 06-12-1961  Age: 54 y.o. MRN: [id number]  CC: Eye Problem and Stye   HPI [name] presents for ***  Outpatient Prescriptions Prior to Visit  Medication Sig Dispense Refill  . amLODipine (NORVASC) 5 MG tablet Take 1 tablet (5 mg total) by mouth daily. 90 tablet 1  . clobetasol (OLUX) 0.05 % topical foam APPLY TOPICALLY TWICE DAILY. 100 g 0  . clobetasol ointment (TEMOVATE) 0.24 % Apply 1 application topically 2 (two) times daily.    Marland Kitchen desonide (DESOWEN) 0.05 % cream Apply topically 2 (two) times daily. 60 g 2  . Erythromycin 2 % ointment Apply 1 drop topically 2 (two) times daily. For 10 days 25 g 0  . fluocinonide (LIDEX) 0.05 % external solution Apply 1 application topically daily as needed. 60 mL 2  . Fluocinonide 0.1 % CREA Apply 1 application topically daily as needed. 60 g 2  . fluticasone (FLONASE) 50 MCG/ACT nasal spray Place 2 sprays into both nostrils daily. (Patient taking differently: Place 2 sprays into both nostrils daily as needed for allergies. ) 16 g 11  . hydrochlorothiazide (HYDRODIURIL) 12.5 MG tablet Take 1 tablet (12.5 mg total) by mouth daily. 90 tablet 1  . montelukast (SINGULAIR) 10 MG tablet Take 1 tablet (10 mg total) by mouth at bedtime. 30 tablet 3  . rosuvastatin (CRESTOR) 10 MG tablet Take 1 tablet (10 mg total) by mouth daily. 90 tablet 3  . clobetasol (OLUX) 0.05 % topical foam Apply 1 application topically 2 (two) times daily.     No facility-administered medications prior to visit.    History   Social History  . Marital Status: Divorced    Spouse Name: N/A  . Number of Children: 1  . Years of Education: N/A   Occupational History  . TEACHER     [employer]   Social History Main Topics  . Smoking status: Never Smoker   . Smokeless tobacco: Never Used  . Alcohol Use: No  . Drug Use: No  . Sexual Activity: Yes     Comment: bisexual   Other Topics Concern  . None   Social  History Narrative   Original from [place]    Marital status: separated x 1 year after 13 years of marriage; not dating.   Children:  1 child (15).   Lives: with roommate; sees son daily   Employment: Pharmacist, hospital; transition from Administration to teaching again in 2015; moved from [place], [place] in 2015; [employer] teacher              Family History  Problem Relation Age of Onset  . Colon cancer Neg Hx   . Esophageal cancer Neg Hx   . Stomach cancer Neg Hx   . Prostate cancer Neg Hx   . Diabetes Neg Hx   . CAD Neg Hx     Past Medical History  Diagnosis Date  . Asthma     seasonal, with allergies  . Hyperlipidemia   . Hypertension   . Psoriasis     [name]  . History of kidney stones     x1  . Colon polyp 10/27/2011  . Allergy     Flonase, Patanol, Zyrtec  . Arthritis     psoriasis  . 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 Low back pain     "in kidney area-was told that it  was related to gallstones"  . Depression   . GERD (gastroesophageal reflux disease)   . Borderline diabetes      Review of Systems  Constitutional: Negative for fever, chills and appetite change.  HENT: Negative for congestion, ear pain, postnasal drip, sinus pressure and sore throat.   Eyes: Positive for discharge and redness. Negative for pain.  Respiratory: Negative for cough, shortness of breath and wheezing.   Cardiovascular: Negative for leg swelling.  Gastrointestinal: Negative for nausea, vomiting, abdominal pain, diarrhea, constipation and blood in stool.  Endocrine: Negative for polyuria.  Genitourinary: Negative for dysuria, urgency, frequency and flank pain.  Musculoskeletal: Negative for gait problem.  Skin: Negative for rash.  Neurological: Negative for weakness and headaches.  Psychiatric/Behavioral: Negative for confusion and decreased concentration. The patient is not nervous/anxious.     Objective:  BP 124/70 mmHg   Pulse 81  Temp(Src) 98.6 F (37 C) (Oral)  Resp 18  Ht 5' 6.5" (1.689 m)  Wt 184 lb (83.462 kg)  BMI 29.26 kg/m2  SpO2 98%  BP Readings from Last 3 Encounters:  05/21/15 124/70  02/25/15 110/70  12/20/14 129/86    Wt Readings from Last 3 Encounters:  05/21/15 184 lb (83.462 kg)  02/25/15 175 lb (79.379 kg)  12/20/14 176 lb 6.4 oz (80.015 kg)    Physical Exam  Constitutional: He is oriented to person, place, and time. He appears well-developed and well-nourished.  HENT:  Head: Normocephalic and atraumatic.  Eyes: Pupils are equal, round, and reactive to light. Right eye exhibits discharge. Left eye exhibits discharge. Right conjunctiva is injected. Left conjunctiva is injected.  Pulmonary/Chest: Effort normal.  Musculoskeletal: He exhibits no edema.  Neurological: He is alert and oriented to person, place, and time.  Skin: Skin is dry.  Psychiatric: He has a normal mood and affect. His behavior is normal. Thought content normal.    Lab Results  Component Value Date   WBC 6.5 12/15/2014   HGB 17.1* 12/15/2014   HCT 50.1 12/15/2014   PLT 293 12/15/2014   GLUCOSE 106* 12/15/2014   CHOL 228* 12/28/2014   TRIG 363* 12/28/2014   HDL 37* 12/28/2014   LDLCALC 118* 12/28/2014   ALT 23 12/15/2014   AST 18 12/15/2014   NA 144 12/15/2014   K 4.0 12/15/2014   CL 101 12/15/2014   CREATININE 0.90 03/20/2015   BUN 12 12/15/2014   CO2 30 12/15/2014   TSH 2.403 06/27/2014   PSA 4.44* 09/29/2014   INR 1.1 ratio* 06/10/2010   HGBA1C 6.1* 12/28/2014      .  Assessment & Plan:   Leotis was seen today for eye problem and stye.  Diagnoses and all orders for this visit:  Bilateral conjunctivitis  Other orders -     trimethoprim-polymyxin b (POLYTRIM) ophthalmic solution; Place 1 drop into both eyes every 4 (four) hours.   I am having Mr. Cromartie start on trimethoprim-polymyxin b. I am also having him maintain his desonide, fluticasone, clobetasol ointment, Fluocinonide,  fluocinonide, amLODipine, hydrochlorothiazide, rosuvastatin, montelukast, Erythromycin, and clobetasol.  Meds ordered this encounter  Medications  . trimethoprim-polymyxin b (POLYTRIM) ophthalmic solution    Sig: Place 1 drop into both eyes every 4 (four) hours.    Dispense:  10 mL    Refill:  0     Follow-up: Return if symptoms worsen or fail to improve.  Roselee Culver, MD

## 2015-07-26 ENCOUNTER — Other Ambulatory Visit: Payer: Self-pay | Admitting: Family Medicine

## 2015-07-26 ENCOUNTER — Other Ambulatory Visit: Payer: Self-pay | Admitting: Physician Assistant

## 2015-08-30 ENCOUNTER — Other Ambulatory Visit: Payer: Self-pay | Admitting: Family Medicine

## 2015-09-09 ENCOUNTER — Ambulatory Visit (INDEPENDENT_AMBULATORY_CARE_PROVIDER_SITE_OTHER): Payer: BC Managed Care – PPO | Admitting: Emergency Medicine

## 2015-09-09 ENCOUNTER — Telehealth: Payer: Self-pay | Admitting: Emergency Medicine

## 2015-09-09 VITALS — BP 120/86 | HR 94 | Temp 98.1°F | Resp 16 | Ht 66.0 in | Wt 190.6 lb

## 2015-09-09 DIAGNOSIS — E785 Hyperlipidemia, unspecified: Secondary | ICD-10-CM | POA: Diagnosis not present

## 2015-09-09 DIAGNOSIS — R7302 Impaired glucose tolerance (oral): Secondary | ICD-10-CM

## 2015-09-09 DIAGNOSIS — R972 Elevated prostate specific antigen [PSA]: Secondary | ICD-10-CM | POA: Diagnosis not present

## 2015-09-09 DIAGNOSIS — I10 Essential (primary) hypertension: Secondary | ICD-10-CM

## 2015-09-09 DIAGNOSIS — R21 Rash and other nonspecific skin eruption: Secondary | ICD-10-CM | POA: Diagnosis not present

## 2015-09-09 DIAGNOSIS — B354 Tinea corporis: Secondary | ICD-10-CM

## 2015-09-09 LAB — LIPID PANEL
CHOL/HDL RATIO: 3.5 ratio (ref <=5.0)
Cholesterol: 125 mg/dL (ref 125–200)
HDL: 36 mg/dL — AB (ref >=40)
LDL CALC: 49 mg/dL (ref <130)
TRIGLYCERIDES: 198 mg/dL — AB (ref <150)
VLDL: 40 mg/dL — ABNORMAL HIGH (ref <30)

## 2015-09-09 LAB — COMPLETE METABOLIC PANEL WITH GFR
ALBUMIN: 4.4 g/dL (ref 3.6–5.1)
ALK PHOS: 54 U/L (ref 40–115)
ALT: 34 U/L (ref 9–46)
AST: 25 U/L (ref 10–35)
BUN: 11 mg/dL (ref 7–25)
CHLORIDE: 100 mmol/L (ref 98–110)
CO2: 31 mmol/L (ref 20–31)
Calcium: 9.6 mg/dL (ref 8.6–10.3)
Creat: 1.04 mg/dL (ref 0.70–1.33)
GFR, EST NON AFRICAN AMERICAN: 81 mL/min (ref >=60)
GFR, Est African American: >89 mL/min (ref >=60)
GLUCOSE: 109 mg/dL — AB (ref 65–99)
POTASSIUM: 4 mmol/L (ref 3.5–5.3)
SODIUM: 140 mmol/L (ref 135–146)
Total Bilirubin: 0.9 mg/dL (ref 0.2–1.2)
Total Protein: 7.2 g/dL (ref 6.1–8.1)

## 2015-09-09 LAB — POCT CBC
Granulocyte percent: 67.6 %G (ref 37–80)
HEMATOCRIT: 49.9 % (ref 43.5–53.7)
HEMOGLOBIN: 16.7 g/dL (ref 14.1–18.1)
Lymph, poc: 1.3 (ref 0.6–3.4)
MCH: 29.1 pg (ref 27–31.2)
MCHC: 33.6 g/dL (ref 31.8–35.4)
MCV: 86.5 fL (ref 80–97)
MID (CBC): 0.5 (ref 0–0.9)
MPV: 6.4 fL (ref 0–99.8)
POC GRANULOCYTE: 3.8 (ref 2–6.9)
POC LYMPH PERCENT: 24.1 %L (ref 10–50)
POC MID %: 8.3 %M (ref 0–12)
Platelet Count, POC: 327 10*3/uL (ref 142–424)
RBC: 5.77 M/uL (ref 4.69–6.13)
RDW, POC: 13 %
WBC: 5.6 10*3/uL (ref 4.6–10.2)

## 2015-09-09 LAB — POCT GLYCOSYLATED HEMOGLOBIN (HGB A1C): HEMOGLOBIN A1C: 6

## 2015-09-09 LAB — GLUCOSE, POCT (MANUAL RESULT ENTRY): POC GLUCOSE: 107 mg/dL — AB (ref 70–99)

## 2015-09-09 LAB — POCT SKIN KOH: SKIN KOH, POC: POSITIVE

## 2015-09-09 MED ORDER — FLUOCINONIDE 0.1 % EX CREA: TOPICAL_CREAM | CUTANEOUS | Status: DC

## 2015-09-09 NOTE — Telephone Encounter (Signed)
Call patient and let him know the skin scraping on his legs did show yeast. Advised him to continue the Lotrimin 2-3 times a day.

## 2015-09-09 NOTE — Progress Notes (Addendum)
Subjective:  This chart was scribed for Arlyss Queen MD, by Tamsen Roers, at [facility].  This patient was seen in room and the patient's care was started at 9:00 AM.   Chief Complaint  Patient presents with  . annual blood work  . Medication Refill    would like prescription for Flucinolone acetonide the cream he already has oil     Patient ID: Rickey Cruz, male    DOB: [birth year], 54 y.o.   MRN: [id number]  HPI  HPI Comments: Rickey Cruz is a 54 y.o. male who presents to the [facility] for annual blood work.  Patient is currently on Crestor and blood pressure medication. He states that his blood pressure was "good" today. Patient would not like a flu vaccination.   Rash: Patient is also complaining of an intchy rash on his foot bilaterally and thinks it may be due to walking in the rain.  He has been using lotramin and thinks it may be healing.   Blood pressure left arm: 124/86  Patient is from [place].  He will be going to [place] and [place] in 2 weeks.     Patient Active Problem List   Diagnosis Date Noted  . Glucose intolerance (impaired glucose tolerance) 12/23/2014  . Hyperlipidemia 12/23/2014  . Cholelithiasis 06/30/2014  . Psoriasis 06/27/2014  . Multiple lung nodules 12/09/2011  . BACK PAIN 06/10/2010  . DYSPNEA ON EXERTION 03/16/2010  . PULMONARY HYPERTENSION 03/04/2010  . HYPERLIPIDEMIA 02/10/2010  . Essential hypertension 02/10/2010  . ALLERGIC RHINITIS 02/10/2010  . BRONCHITIS 02/10/2010  . Asthma in adult 02/10/2010  . OBSTRUCTIVE SLEEP APNEA 02/04/2010  . TUBERCULOSIS, HX OF 02/04/2010   Past Medical History  Diagnosis Date  . Asthma     seasonal, with allergies  . Hyperlipidemia   . Hypertension   . Psoriasis     [name]  . History of kidney stones     x1  . Colon polyp 10/27/2011  . Allergy     Flonase, Patanol, Zyrtec  . Arthritis     psoriasis  . Tuberculosis     granuloma on  lungs, 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  . Low back pain     "in kidney area-was told that it was related to gallstones"  . Depression   . GERD (gastroesophageal reflux disease)   . Borderline diabetes    Past Surgical History  Procedure Laterality Date  . Colonoscopy  10/27/2011    single polyp. Repeat 5 years.  . Polypectomy    . Septoplasty  2014  . Prostate biopsy  10/2014    will have another in 3 months  . Appendectomy    . Tonsillectomy  2014  . Uvulopalatopharyngoplasty  2014  . Cholecystectomy N/A 12/16/2014    Procedure: LAPAROSCOPIC CHOLECYSTECTOMY WITH INTRAOPERATIVE CHOLANGIOGRAM;  Surgeon: [name], MD;  Location: [facility];  Service: General;  Laterality: N/A;   No Known Allergies Prior to Admission medications   Medication Sig Start Date End Date Taking? Authorizing Provider  amLODipine (NORVASC) 5 MG tablet TAKE 1 TABLET BY MOUTH DAILY.  "OV NEEDED FOR REFILLS" 09/01/15  Yes Chelle Jeffery, PA-C  clobetasol (OLUX) 0.05 % topical foam APPLY TOPICALLY TWICE DAILY 07/27/15  Yes [name], MD  clobetasol ointment (TEMOVATE) 1.66 % Apply 1 application topically 2 (two) times daily.   Yes Historical Provider, MD  cycloSPORINE (RESTASIS) 0.05 % ophthalmic emulsion 1 drop 2 (two) times daily.   Yes Historical  Provider, MD  desonide (DESOWEN) 0.05 % cream Apply topically 2 (two) times daily. 06/27/14  Yes [name], MD  FLUOCINOLONE ACETONIDE SCALP 0.01 % OIL APPLY 1 ML TO THE AFFECTED AREA ON THE SKIN NIGHTLY 07/27/15  Yes [name], MD  fluocinonide (LIDEX) 0.05 % external solution Apply 1 application topically daily as needed. 12/20/14  Yes [name], MD  Fluocinonide 0.1 % CREA APPLY TOPICALLY TO THE AFFECTED AREA DAILY AS NEEDED 07/27/15  Yes [name], MD  fluticasone [facility]) 50 MCG/ACT nasal spray Place 2 sprays into both nostrils daily. Patient taking differently: Place 2 sprays into both nostrils daily as needed for allergies.  06/27/14  Yes [name], MD    hydrochlorothiazide (HYDRODIURIL) 12.5 MG tablet TAKE 1 TABLET BY MOUTH DAILY.  "OV NEEDED FOR REFILLS" 09/01/15  Yes Chelle Jeffery, PA-C  montelukast (SINGULAIR) 10 MG tablet Take 1 tablet (10 mg total) by mouth at bedtime. 02/25/15  Yes Deanna M Didiano, DO  rosuvastatin (CRESTOR) 10 MG tablet TAKE 1 TABLET BY MOUTH DAILY.  "OV NEEDED FOR REFILLS" 09/01/15  Yes Chelle Jeffery, PA-C  Erythromycin 2 % ointment Apply 1 drop topically 2 (two) times daily. For 10 days Patient not taking: Reported on 09/09/2015 02/25/15   Deanna M Didiano, DO  trimethoprim-polymyxin b (POLYTRIM) ophthalmic solution Place 1 drop into both eyes every 4 (four) hours. Patient not taking: Reported on 09/09/2015 05/21/15   Roselee Culver, MD   Social History   Social History  . Marital Status: Divorced    Spouse Name: N/A  . Number of Children: 1  . Years of Education: N/A   Occupational History  . TEACHER     [employer]   Social History Main Topics  . Smoking status: Never Smoker   . Smokeless tobacco: Never Used  . Alcohol Use: No  . Drug Use: No  . Sexual Activity: Yes     Comment: bisexual   Other Topics Concern  . Not on file   Social History Narrative   Original from [place]    Marital status: separated x 1 year after 13 years of marriage; not dating.   Children:  1 child (15).   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  Respiratory: Negative for cough and shortness of breath.   Gastrointestinal: Negative for nausea and vomiting.  Skin: Positive for rash.       Objective:   Physical Exam CONSTITUTIONAL: Well developed/well nourished HEAD: Normocephalic/atraumatic EYES: EOMI/PERRL ENMT: Mucous membranes moist NECK: supple no meningeal  signs SPINE/BACK:entire spine nontender CV: S1/S2 noted, no murmurs/rubs/gallops noted LUNGS: Lungs are clear to auscultation bilaterally, no apparent distress NEURO: Pt is awake/alert/appropriate, moves all extremitiesx4.  No facial droop.   EXTREMITIES: pulses normal/equal, full ROM SKIN:  PSYCH: no abnormalities of mood noted, alert and oriented to situation Results for orders placed or performed in visit on 09/09/15  POCT CBC  Result Value Ref Range   WBC 5.6 4.6 - 10.2 K/uL   Lymph, poc 1.3 0.6 - 3.4   POC LYMPH PERCENT 24.1 10 - 50 %L   MID (cbc) 0.5 0 - 0.9   POC MID % 8.3 0 - 12 %M  POC Granulocyte 3.8 2 - 6.9   Granulocyte percent 67.6 37 - 80 %G   RBC 5.77 4.69 - 6.13 M/uL   Hemoglobin 16.7 14.1 - 18.1 g/dL   HCT, POC 49.9 43.5 - 53.7 %   MCV 86.5 80 - 97 fL   MCH, POC 29.1 27 - 31.2 pg   MCHC 33.6 31.8 - 35.4 g/dL   RDW, POC 13.0 %   Platelet Count, POC 327 142 - 424 K/uL   MPV 6.4 0 - 99.8 fL   Results for orders placed or performed in visit on 09/09/15  COMPLETE METABOLIC PANEL WITH GFR  Result Value Ref Range   Sodium 140 135 - 146 mmol/L   Potassium 4.0 3.5 - 5.3 mmol/L   Chloride 100 98 - 110 mmol/L   CO2 31 20 - 31 mmol/L   Glucose, Bld 109 (H) 65 - 99 mg/dL   BUN 11 7 - 25 mg/dL   Creat 1.04 0.70 - 1.33 mg/dL   Total Bilirubin 0.9 0.2 - 1.2 mg/dL   Alkaline Phosphatase 54 40 - 115 U/L   AST 25 10 - 35 U/L   ALT 34 9 - 46 U/L   Total Protein 7.2 6.1 - 8.1 g/dL   Albumin 4.4 3.6 - 5.1 g/dL   Calcium 9.6 8.6 - 10.3 mg/dL   GFR, Est African American >89 >=60 mL/min   GFR, Est Non African American 81 >=60 mL/min  PSA  Result Value Ref Range   PSA  <=4.00 ng/mL  Lipid panel  Result Value Ref Range   Cholesterol 125 125 - 200 mg/dL   Triglycerides 198 (H) <150 mg/dL   HDL 36 (L) >=40 mg/dL   Total CHOL/HDL Ratio 3.5 <=5.0 Ratio   VLDL 40 (H) <30 mg/dL   LDL Cholesterol 49 <130 mg/dL  POCT CBC  Result Value Ref Range   WBC 5.6 4.6 - 10.2 K/uL    Lymph, poc 1.3 0.6 - 3.4   POC LYMPH PERCENT 24.1 10 - 50 %L   MID (cbc) 0.5 0 - 0.9   POC MID % 8.3 0 - 12 %M   POC Granulocyte 3.8 2 - 6.9   Granulocyte percent 67.6 37 - 80 %G   RBC 5.77 4.69 - 6.13 M/uL   Hemoglobin 16.7 14.1 - 18.1 g/dL   HCT, POC 49.9 43.5 - 53.7 %   MCV 86.5 80 - 97 fL   MCH, POC 29.1 27 - 31.2 pg   MCHC 33.6 31.8 - 35.4 g/dL   RDW, POC 13.0 %   Platelet Count, POC 327 142 - 424 K/uL   MPV 6.4 0 - 99.8 fL  POCT glucose (manual entry)  Result Value Ref Range   POC Glucose 107 (A) 70 - 99 mg/dl  POCT glycosylated hemoglobin (Hb A1C)  Result Value Ref Range   Hemoglobin A1C 6.0   POCT Skin KOH  Result Value Ref Range   Skin KOH, POC Positive     Filed Vitals:   09/09/15 0840  BP: 110/82  Pulse: 98  Temp: 98.1 F (36.7 C)  TempSrc: Oral  Resp: 16  Height: 5\' 6"  (1.676 m)  Weight: 190 lb 9.6 oz (86.456 kg)  SpO2: 98%       Assessment & Plan:  1. Essential hypertension Blood pressure is at goal - POCT CBC - COMPLETE METABOLIC PANEL WITH GFR  2. Hyperlipidemia Repeat lipid panel done - Lipid panel  3. Glucose intolerance (impaired glucose tolerance) Hemoglobin A1c  is 6 change in treatment - POCT glucose (manual entry) - POCT glycosylated hemoglobin (Hb A1C)  4. Rash and nonspecific skin eruption KOH positive for yeast advise he used Lotrimin cream to the area twice a day - POCT Skin KOH - Fluocinonide 0.1 % CREA; APPLY TOPICALLY TO THE AFFECTED AREA DAILY AS NEEDED  Dispense: 60 g; Refill: 3  5. Elevated PSA PSA has been elevated he is scheduled for a biopsy later this year. - PSA  6. Tinea corporis Patient will be called to apply Lotrimin cream to his rash on the lower extremities twice a day.   I personally performed the services described in this documentation, which was scribed in my presence. The recorded information has been reviewed and is accurate.  Arlyss Queen, MD  [facility]  09/09/2015 5:56 PM

## 2015-09-10 ENCOUNTER — Encounter: Payer: Self-pay | Admitting: Family Medicine

## 2015-09-10 LAB — PSA: PSA: 3.85 ng/mL (ref <=4.00)

## 2015-09-11 ENCOUNTER — Other Ambulatory Visit: Payer: Self-pay | Admitting: Emergency Medicine

## 2015-09-11 DIAGNOSIS — IMO0002 Reserved for concepts with insufficient information to code with codable children: Secondary | ICD-10-CM

## 2015-09-11 DIAGNOSIS — E1165 Type 2 diabetes mellitus with hyperglycemia: Secondary | ICD-10-CM

## 2015-09-11 NOTE — Telephone Encounter (Signed)
Call patient referral made to dietary.

## 2015-09-11 NOTE — Telephone Encounter (Signed)
Called pt and spoke with him. Informed him to continue using Lotrimin 2-3 times a day. Also went over lab results from last visit with pt. Pt states he would like a referral to a dietician.

## 2015-09-13 ENCOUNTER — Telehealth: Payer: Self-pay | Admitting: Family Medicine

## 2015-09-13 ENCOUNTER — Encounter: Payer: Self-pay | Admitting: Family Medicine

## 2015-09-13 DIAGNOSIS — L409 Psoriasis, unspecified: Secondary | ICD-10-CM

## 2015-09-13 MED ORDER — FLUOCINOLONE ACETONIDE 0.01 % EX CREA: TOPICAL_CREAM | Freq: Two times a day (BID) | CUTANEOUS | Status: DC

## 2015-09-13 NOTE — Telephone Encounter (Signed)
Patient came in office stating that the rx sent in for Fluocinonide cream was the wrong one. It should be Fluocinolone cream.

## 2015-09-14 NOTE — Telephone Encounter (Signed)
Left message on pt's VM informing him that new prescription was sent to pharmacy.

## 2015-10-07 ENCOUNTER — Other Ambulatory Visit: Payer: Self-pay | Admitting: Family Medicine

## 2015-10-08 ENCOUNTER — Other Ambulatory Visit: Payer: Self-pay | Admitting: Family Medicine

## 2015-11-02 ENCOUNTER — Other Ambulatory Visit: Payer: Self-pay | Admitting: Family Medicine

## 2015-11-02 ENCOUNTER — Other Ambulatory Visit: Payer: Self-pay | Admitting: Sports Medicine

## 2015-11-08 ENCOUNTER — Other Ambulatory Visit: Payer: Self-pay | Admitting: Family Medicine

## 2015-11-28 ENCOUNTER — Other Ambulatory Visit: Payer: Self-pay | Admitting: Physician Assistant

## 2015-12-06 ENCOUNTER — Other Ambulatory Visit: Payer: Self-pay | Admitting: Family Medicine

## 2015-12-30 ENCOUNTER — Ambulatory Visit: Payer: BC Managed Care – PPO | Admitting: Allergy and Immunology

## 2015-12-30 ENCOUNTER — Encounter: Payer: Self-pay | Admitting: Allergy and Immunology

## 2015-12-30 ENCOUNTER — Ambulatory Visit (INDEPENDENT_AMBULATORY_CARE_PROVIDER_SITE_OTHER): Payer: BC Managed Care – PPO | Admitting: Allergy and Immunology

## 2015-12-30 VITALS — BP 125/85 | HR 75 | Temp 98.2°F | Resp 16 | Ht 66.0 in | Wt 190.7 lb

## 2015-12-30 DIAGNOSIS — H00016 Hordeolum externum left eye, unspecified eyelid: Secondary | ICD-10-CM

## 2015-12-30 DIAGNOSIS — J4531 Mild persistent asthma with (acute) exacerbation: Secondary | ICD-10-CM

## 2015-12-30 DIAGNOSIS — H1010 Acute atopic conjunctivitis, unspecified eye: Secondary | ICD-10-CM | POA: Diagnosis not present

## 2015-12-30 DIAGNOSIS — J309 Allergic rhinitis, unspecified: Secondary | ICD-10-CM

## 2015-12-30 MED ORDER — BECLOMETHASONE DIPROPIONATE 80 MCG/ACT IN AERS: 2.0000 | INHALATION_SPRAY | Freq: Two times a day (BID) | RESPIRATORY_TRACT | Status: DC

## 2015-12-30 MED ORDER — ALBUTEROL SULFATE HFA 108 (90 BASE) MCG/ACT IN AERS: 2.0000 | INHALATION_SPRAY | RESPIRATORY_TRACT | Status: DC | PRN

## 2015-12-30 MED ORDER — BUDESONIDE 32 MCG/ACT NA SUSP: NASAL | Status: DC

## 2015-12-30 NOTE — Patient Instructions (Addendum)
  Take Home Sheet  1. Avoidance: Mite and significant temperature/weather changes.                            2. Antihistamine: Zyrtec 10mg  by mouth once daily for runny nose or itching.   3. Nasal Spray: Rhinocort AQ 1-2 spray(s) each nostril once daily for stuffy nose or drainage.     Stop Flonase  4. Inhalers:  Rescue: ProAir 2 puffs every 4 hours as needed for cough or wheeze.       -May use 2 puffs 10-20 minutes prior to exercise.   Preventative: QVAR 25mcg  2 puffs twice daily (Rinse, gargle, and spit out after use).  5.  Mucinex once tablet twice daily for the next several days and then as needed.  6. Other:  Singulair 10mg  once daily.        Prednisone 30mg  today and then 20mg  daily for 3 days then 10mg  on last day.                         Use Tylenol as needed every 6 hours for headache.  7. Nasal Saline wash followed by nasal spray once daily and use each evening in the shower and as needed throughout the day.   8. Follow up Visit:  1 month or sooner if needed.   Websites that have reliable Patient information: 1. American Academy of Asthma, Allergy, & Immunology: www.aaaai.org 2. Food Allergy Network: www.foodallergy.org 3. Mothers of Asthmatics: www.aanma.org 4. [place]: DiningCalendar.de 5. American College of Allergy, Asthma, & Immunology: https://robertson.info/ or www.acaai.org

## 2015-12-30 NOTE — Progress Notes (Signed)
NEW PATIENT NOTE  RE: Rickey Cruz MRN: [id number] DOB: 14-Aug-1961 [facility] Rickey Cruz [address] [place] [address] Date of Office Visit: 12/30/2015  Referring provider: [name], MD [address] [place], [place] [address]  Subjective:  Rickey Cruz is a 55 y.o. male who presents today for Cough; Wheezing; Shortness of Breath; and Headache  Assessment:   1. Mild persistent asthma, with acute exacerbation, in no respiratory distress.  2. Allergic rhinoconjunctivitis.   3.      Viral upper respiratory infection, afebrile with associated headache as  trigger for #1. Plan:   Meds ordered this encounter  Medications  . budesonide (RHINOCORT AQUA) 32 MCG/ACT nasal spray    Sig: Use 1-2 sprays in each nostril once daily for stuffy nose or drainage    Dispense:  1 Bottle    Refill:  5  . beclomethasone (QVAR) 80 MCG/ACT inhaler    Sig: Inhale 2 puffs into the lungs 2 (two) times daily.    Dispense:  1 Inhaler    Refill:  3  . albuterol (PROAIR HFA) 108 (90 Base) MCG/ACT inhaler    Sig: Inhale 2 puffs into the lungs every 4 (four) hours as needed for wheezing or shortness of breath.    Dispense:  1 Inhaler    Refill:  1   Patient Instructions  1. Avoidance: Mite and significant temperature/weather changes. 2. Antihistamine: Zyrtec 10mg  by mouth once daily for runny nose or itching. 3. Nasal Spray: Rhinocort AQ 1-2 spray(s) each nostril once daily for stuffy nose or drainage. --Stop Flonase 4. Inhalers:  Rescue: ProAir 2 puffs every 4 hours as needed for cough or wheeze.       -May use 2 puffs 10-20 minutes prior to exercise.  Preventative: QVAR 14mcg  2 puffs twice daily (Rinse, gargle, and spit out after use). 5.  Mucinex once tablet twice daily for the next several days and then as needed. 6. Other:  Singulair 10mg  once daily.        Prednisone 30mg  today and then 20mg  daily for 3 days then 10mg  on last day.                         Use  Tylenol as needed every 6 hours for headache. 7. Nasal Saline wash followed by nasal spray once daily and use each evening in the shower and as needed throughout the day. 8. Follow up Visit:  1 month or sooner if needed.  HPI: Rickey Cruz, who was last evaluated in our office in 2013 presents with cough, congestion, wheeze, itchy watery eyes and ears.  He reports his symptoms have been present over the last 4-5 days, though upon further questioning does recall his son had an acute respiratory illness last week.  He has not followed regularly with Allergy physician while living in other cities, but does remember that significant weather changes at this time a year are often are a provoking factor for flare of asthma.  Consistently reports being back in this area for more than a year without recent follow-up with Rickey Cruz.  No recent allergy tablets, but he often has used Singulair 2 tablets once cough presents itself as currently, as well as Flonase.  Cough has increased day and night, though no chest tightness, difficulty breathing, shortness of breath, but intermittent chest congestion with the cough being slightly productive without discolored phlegm.  He has not sought medical care any other location  for his current symptoms. Denies ED or Urgent care visits, prednisone or antibiotic courses.  He generally describes allergies as much better over the recent years.  He specifically reports uvuloplasty in 2014 while living in [place] and no longer requiring CPAP.  Medical History: Reviewed Previous medical history as documented in system. Past Medical History  Diagnosis Date  Follows regularly for hypertension, hypercholesterolemia, psoriasis and previous history of nontuberculous lung calcifications (Rickey Cruz).  Surgical History: Past Surgical History  Procedure Laterality Date  . Colonoscopy  10/27/2011    single polyp. Repeat 5 years.  . Polypectomy    . Septoplasty  2014  . Prostate biopsy  10/2014      will have another in 3 months  . Appendectomy    . Tonsillectomy  2014  . Uvulopalatopharyngoplasty  2014  . Cholecystectomy N/A 12/16/2014    Procedure: LAPAROSCOPIC CHOLECYSTECTOMY WITH INTRAOPERATIVE CHOLANGIOGRAM;  Surgeon: Rickey Skates, MD;  Location: [facility];  Service: General;  Laterality: N/A;   Family History: Family History  Problem Relation Age of Onset  . Colon cancer Neg Hx   . Esophageal cancer Neg Hx   . Stomach cancer Neg Hx   . Prostate cancer Neg Hx   . Diabetes Neg Hx   . CAD Neg Hx    Social History: Rickey Cruz, a Designer, industrial/product and Assessment is a nonsmoker with intermittent dog exposure when with his son (often). Social History Reviewed system documented information.     Medications prior to this encounter: 1.  Singulair 10 mg intermittently. 2.  Flonase 1-2 sprays daily. 3.  Daily Crestor, hydrochlorothiazide, Restasis and Norvasc.  As needed desonide, Temovate and Lidex.  Drug Allergies: No Known Allergies (avoids Naprosyn due to GI distress).  Environmental History: Rickey Cruz lives in a 55 year old apartment for 2 years, carpeted throughout with Central air and heat, Stuffed mattress non-feather pillow and comforter.  Review of Systems  Constitutional: Negative for fever, weight loss and malaise/fatigue.  HENT: Positive for congestion and sore throat. Negative for ear pain, hearing loss and nosebleeds.   Eyes: Positive for discharge (Clear tearing.) and redness.  Respiratory: Positive for cough and wheezing. Negative for shortness of breath.        Denies history of bronchitis and pneumonia.  Gastrointestinal: Negative for heartburn, nausea, vomiting, abdominal pain, diarrhea and constipation.  Genitourinary: Negative.   Musculoskeletal: Negative for myalgias and joint pain.  Skin: Negative.  Negative for itching and rash.  Neurological: Positive for headaches. Negative for dizziness, seizures and weakness.  Endo/Heme/Allergies: Positive  for environmental allergies.       Denies sensitivity to aspirin, NSAIDs, stinging insects, foods, latex and jewelry.   Objective:   Filed Vitals:   12/30/15 1357  BP: 125/85  Pulse: 75  Temp: 98.2 F (36.8 C)  Resp: 16   SpO2 Readings from Last 1 Encounters:  12/30/15 96%   Physical Exam  Constitutional: He is well-developed, well-nourished, and in no distress.  HENT:  Head: Atraumatic.  Right Ear: Tympanic membrane and ear canal normal.  Left Ear: Tympanic membrane and ear canal normal.  Nose: Mucosal edema present. No rhinorrhea. No epistaxis.  Mouth/Throat: Oropharynx is clear and moist and mucous membranes are normal. No oropharyngeal exudate, posterior oropharyngeal edema or posterior oropharyngeal erythema.  Eyes: Right eye exhibits no discharge and no exudate. Left eye exhibits no discharge and no exudate. Right conjunctiva is injected (minimal). Left conjunctiva is injected (Minimal). Pupils are equal.  Neck: Neck supple.  Cardiovascular: Normal rate,  S1 normal and S2 normal.   No murmur heard. Pulmonary/Chest: Effort normal and breath sounds normal. He has no wheezes. He has no rhonchi. He has no rales.  Intermittent cough, post-Xopenex/Atrovent neb: Decreased cough with improved aeration and continued clear breath sounds.  (Patient reports improved).  Abdominal: Soft. Bowel sounds are normal.  Lymphadenopathy:    He has no cervical adenopathy.  Neurological: He is alert.  Skin: Skin is warm and intact. No rash noted. No cyanosis. Nails show no clubbing.   Diagnostics: Spirometry FVC 3.75--99%, FEV1 2.98--97%, essentially no change postbronchodilator.    Roselyn M. Ishmael Holter, MD   cc: [name], MD

## 2015-12-31 NOTE — Addendum Note (Signed)
Addended by: York Grice on: 12/31/2015 08:59 AM   Modules accepted: Orders

## 2016-01-13 ENCOUNTER — Ambulatory Visit (INDEPENDENT_AMBULATORY_CARE_PROVIDER_SITE_OTHER): Payer: BC Managed Care – PPO | Admitting: Family Medicine

## 2016-01-13 ENCOUNTER — Other Ambulatory Visit: Payer: Self-pay | Admitting: Physician Assistant

## 2016-01-13 ENCOUNTER — Ambulatory Visit (INDEPENDENT_AMBULATORY_CARE_PROVIDER_SITE_OTHER): Payer: BC Managed Care – PPO

## 2016-01-13 VITALS — BP 140/90 | HR 67 | Temp 98.3°F | Resp 16 | Ht 66.0 in | Wt 200.0 lb

## 2016-01-13 DIAGNOSIS — M542 Cervicalgia: Secondary | ICD-10-CM

## 2016-01-13 DIAGNOSIS — M79641 Pain in right hand: Secondary | ICD-10-CM | POA: Diagnosis not present

## 2016-01-13 DIAGNOSIS — R7302 Impaired glucose tolerance (oral): Secondary | ICD-10-CM

## 2016-01-13 DIAGNOSIS — J4531 Mild persistent asthma with (acute) exacerbation: Secondary | ICD-10-CM

## 2016-01-13 DIAGNOSIS — I10 Essential (primary) hypertension: Secondary | ICD-10-CM | POA: Diagnosis not present

## 2016-01-13 DIAGNOSIS — M7989 Other specified soft tissue disorders: Secondary | ICD-10-CM

## 2016-01-13 DIAGNOSIS — L409 Psoriasis, unspecified: Secondary | ICD-10-CM

## 2016-01-13 LAB — POCT GLYCOSYLATED HEMOGLOBIN (HGB A1C): HEMOGLOBIN A1C: 6.3

## 2016-01-13 LAB — POCT CBC
GRANULOCYTE PERCENT: 66.7 % (ref 37–80)
HCT, POC: 47.6 % (ref 43.5–53.7)
Hemoglobin: 16.1 g/dL (ref 14.1–18.1)
Lymph, poc: 1.9 (ref 0.6–3.4)
MCH: 29.9 pg (ref 27–31.2)
MCHC: 33.8 g/dL (ref 31.8–35.4)
MCV: 88.3 fL (ref 80–97)
MID (CBC): 0.4 (ref 0–0.9)
MPV: 6.4 fL (ref 0–99.8)
PLATELET COUNT, POC: 313 10*3/uL (ref 142–424)
POC Granulocyte: 4.6 (ref 2–6.9)
POC LYMPH PERCENT: 27.6 %L (ref 10–50)
POC MID %: 5.7 %M (ref 0–12)
RBC: 5.39 M/uL (ref 4.69–6.13)
RDW, POC: 12.9 %
WBC: 6.9 10*3/uL (ref 4.6–10.2)

## 2016-01-13 LAB — GLUCOSE, POCT (MANUAL RESULT ENTRY): POC Glucose: 130 mg/dl — AB (ref 70–99)

## 2016-01-13 IMAGING — CR DG CERVICAL SPINE COMPLETE 4+V
5 series · 5 of 5 positions shown · non-contrast
Comparison: No priors.

CLINICAL DATA: 54-year-old male with left-sided neck pain for 1
week.

EXAM:
CERVICAL SPINE - COMPLETE 4+ VIEW

[lpo]
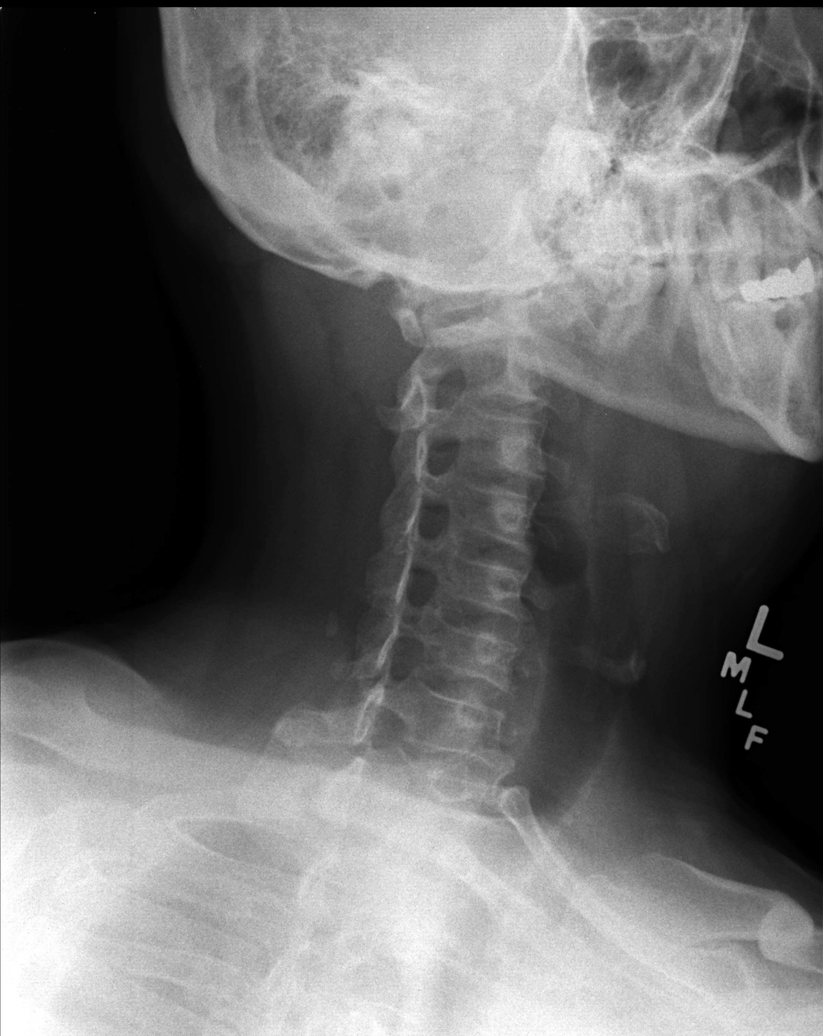

[lateral]
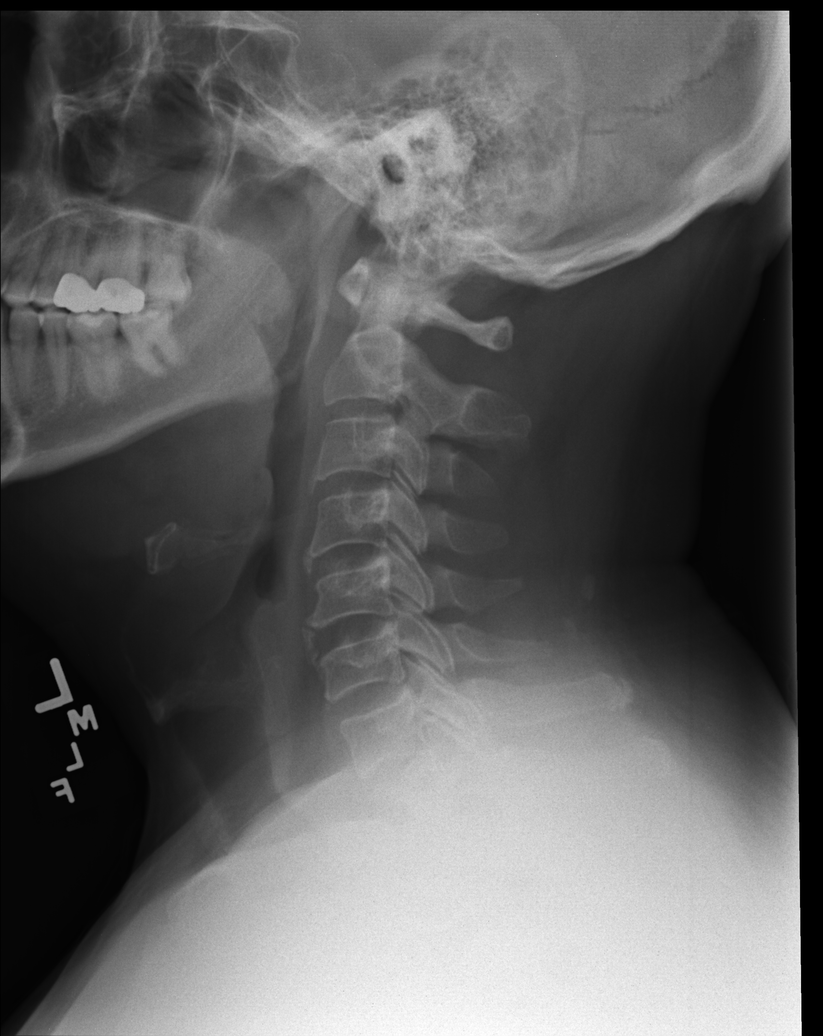

[rpo]
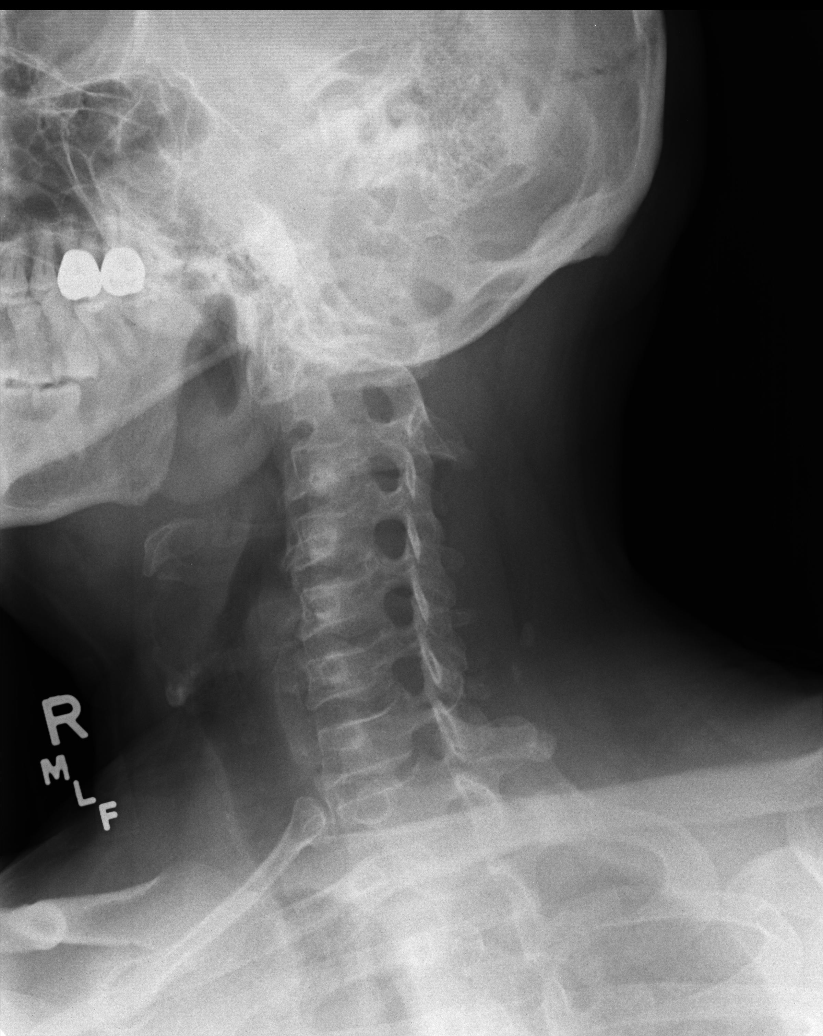

[AP]
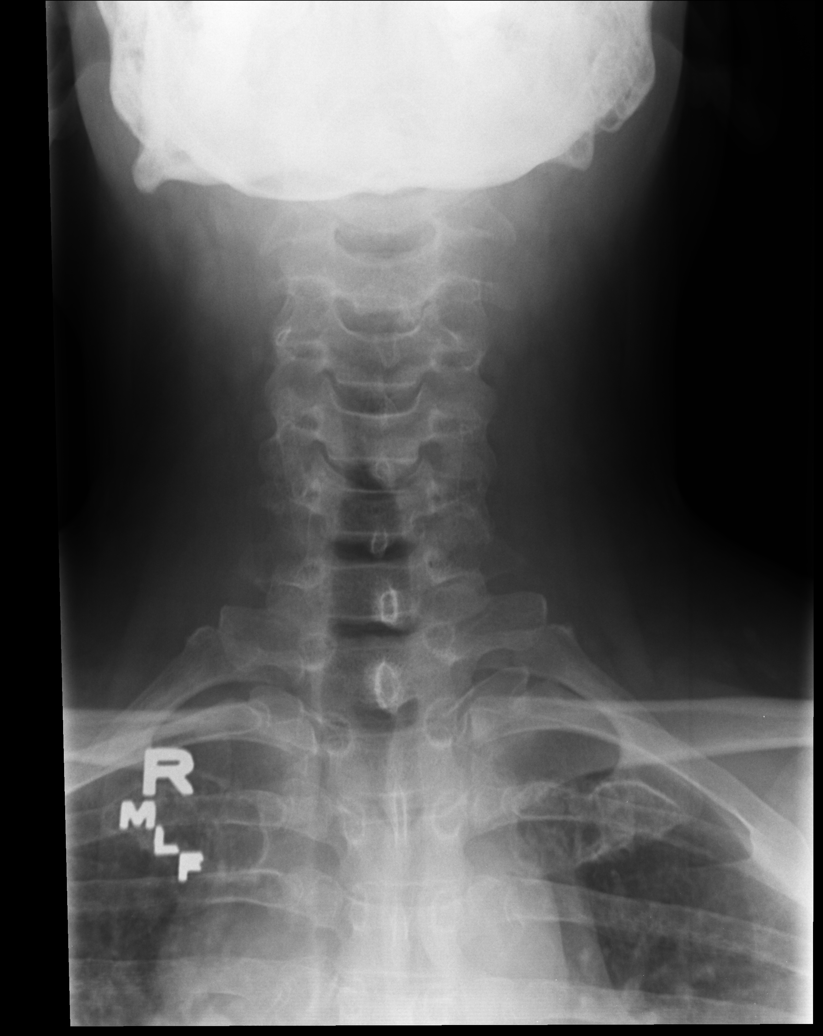

[ap open mouth]
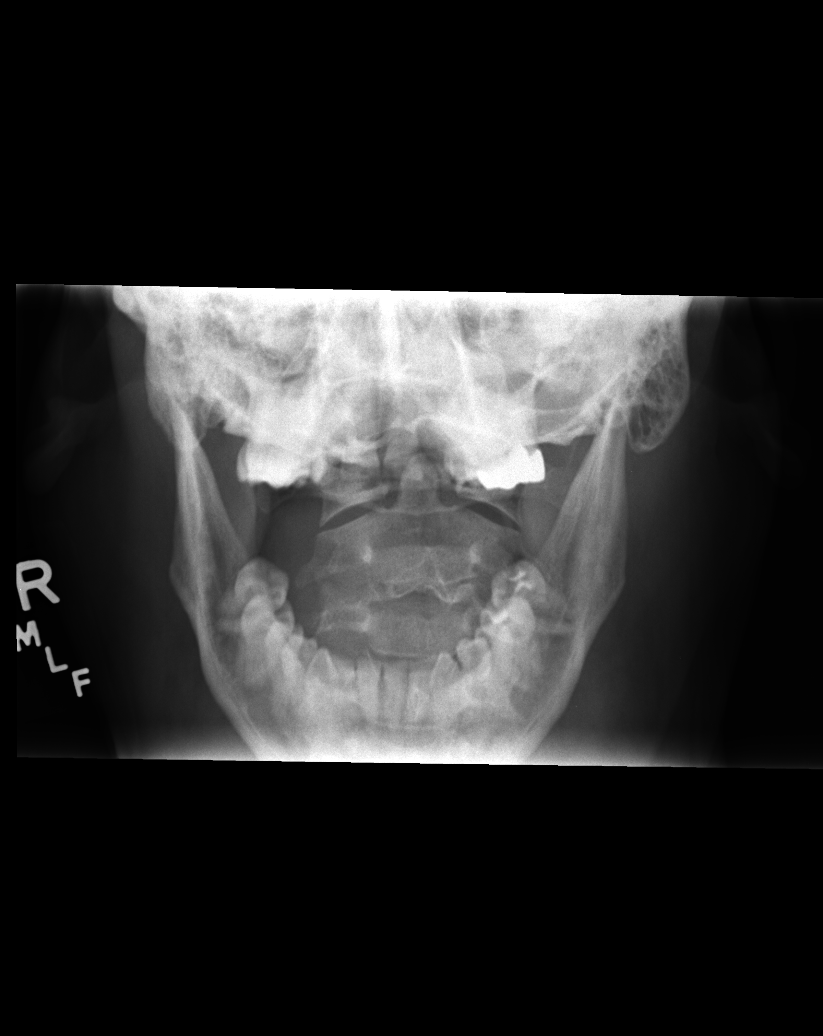

[5 of 5 positions shown; findings below may reference images not displayed]

FINDINGS: Multiple views of the cervical spine demonstrate no acute displaced
fracture. Alignment is anatomic. Prevertebral soft tissues are
normal. Mild multilevel degenerative disc disease and facet
arthropathy.
IMPRESSION: 1. No acute radiographic abnormality of the cervical spine.
2. Mild multilevel degenerative disc disease and cervical
spondylosis.

## 2016-01-13 MED ORDER — PREDNISONE 20 MG PO TABS: ORAL_TABLET | ORAL | Status: DC

## 2016-01-13 MED ORDER — METHOCARBAMOL 500 MG PO TABS: 500.0000 mg | ORAL_TABLET | Freq: Every day | ORAL | Status: DC

## 2016-01-13 MED ORDER — METHYLPREDNISOLONE SODIUM SUCC 125 MG IJ SOLR
80.0000 mg | Freq: Once | INTRAMUSCULAR | Status: AC
  Administered 2016-01-13: 80 mg via INTRAMUSCULAR

## 2016-01-13 NOTE — Patient Instructions (Signed)
1.  Elevate R hand during the night as much as possible. 2. Ice hand twice daily for 15-20 minutes with each session. 3.  Take Prednisone as prescribed; start tomorrow/Thursday. 4.  Return for worsening swelling, fever, redness, or warmth of R hand. 5. Apply heat to neck twice daily for 15-20 minutes. 6. Take methocarbamol/Robaxin one tablet at bedtime for neck pain/muscle relaxer.

## 2016-01-13 NOTE — Progress Notes (Signed)
Subjective:    Patient ID: Rickey Cruz, male    DOB: 12-12-1961, 55 y.o.   MRN: [id number]  01/13/2016  finger swollen; Neck Pain; and Cough   HPI This 55 y.o. male presents for evaluation of multiple concerns:    1.  Cough: onset last week of December; presented to allergist; prescribed normal medication with Albuterol on 12/30/15; prescribed Prednisone.  Has been suffering with throat clearing; this morning spit out green phlegm dark green.  No fever but +chills/sweats last week with acute illness.  +HA.  Frontal region.  No ear pain; scratchy throat.  No rhinorrhea; using nasal spray.  Mild nasal congestion .  +PND.  No real cough; takign Ibuprofen to headache.   Rhinocort AQ, QVAR, Albuterol.  Prednisone for three days after injection.    2. Neck pain: B. Concerns with arthritis.  Has psoriasis.  3.  R finger pain and swelling: started applying crema to finger; applied antiinflammatory with imrpovement; then today second MCP joint is really hurting. Trying to prevent sausage egg phenomena. Previous evaluation by Dr. Jimmye Norman in [place].  Diagnosed with psoriatic arthritis; recommended MTX; took for two days nad stopped it.  No issues since that time 2010.  Requesting injection into area.  Pain with extension of finger.  Can tolerate pain but does not want to worsen.    4. HTN: Patient reports good compliance with medication, good tolerance to medication, and good symptom control.    5.  Hyperlipidemia: Patient reports good compliance with medication, good tolerance to medication, and good symptom control.    6.  Glucose intolerance: gained weight.  Review of Systems  Constitutional: Negative for fever, chills, diaphoresis, activity change, appetite change and fatigue.  HENT: Positive for congestion, rhinorrhea and voice change. Negative for ear pain, sinus pressure, sneezing and trouble swallowing.   Eyes: Negative for visual disturbance.  Respiratory: Positive for cough. Negative for  shortness of breath.   Cardiovascular: Negative for chest pain, palpitations and leg swelling.  Endocrine: Negative for cold intolerance, heat intolerance, polydipsia, polyphagia and polyuria.  Musculoskeletal: Positive for arthralgias, neck pain and neck stiffness.  Neurological: Negative for dizziness, tremors, seizures, syncope, facial asymmetry, speech difficulty, weakness, light-headedness, numbness and headaches.    Past Medical History  Diagnosis Date  . Asthma     seasonal, with allergies  . Hyperlipidemia   . Hypertension   . Psoriasis     [name]  . History of kidney stones     x1  . Colon polyp 10/27/2011  . Allergy     Flonase, Patanol, Zyrtec  . Arthritis     psoriasis  . 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 Low back pain     "in kidney area-was told that it was related to gallstones"  . Depression   . GERD (gastroesophageal reflux disease)   . Borderline diabetes    Past Surgical History  Procedure Laterality Date  . Colonoscopy  10/27/2011    single polyp. Repeat 5 years.  . Polypectomy    . Septoplasty  2014  . Prostate biopsy  10/2014    will have another in 3 months  . Appendectomy    . Tonsillectomy  2014  . Uvulopalatopharyngoplasty  2014  . Cholecystectomy N/A 12/16/2014    Procedure: LAPAROSCOPIC CHOLECYSTECTOMY WITH INTRAOPERATIVE CHOLANGIOGRAM;  Surgeon: [name], MD;  Location: [facility];  Service: General;  Laterality: N/A;   No Known Allergies  Social History  Social History  . Marital Status: Divorced    Spouse Name: N/A  . Number of Children: 1  . Years of Education: N/A   Occupational History  . TEACHER     [employer]   Social History Main Topics  . Smoking status: Never Smoker   . Smokeless tobacco: Never Used  . Alcohol Use: No  . Drug Use: No  . Sexual Activity: Yes     Comment: bisexual   Other Topics Concern  . Not on file   Social History Narrative   Original  from [place]    Marital status: separated x 1 year after 13 years of marriage; not dating.   Children:  1 child (15).   Lives: with roommate; sees son daily   Employment: Pharmacist, hospital; transition from Administration to teaching again in 2015; moved from [place], [place] in 2015; [employer] Spanish teacher             Family History  Problem Relation Age of Onset  . Colon cancer Neg Hx   . Esophageal cancer Neg Hx   . Stomach cancer Neg Hx   . Prostate cancer Neg Hx   . Diabetes Neg Hx   . CAD Neg Hx        Objective:    BP 140/90 mmHg  Pulse 67  Temp(Src) 98.3 F (36.8 C) (Oral)  Resp 16  Ht 5\' 6"  (1.676 m)  Wt 200 lb (90.719 kg)  BMI 32.30 kg/m2  SpO2 98% Physical Exam  Constitutional: He is oriented to person, place, and time. He appears well-developed and well-nourished. No distress.  HENT:  Head: Normocephalic and atraumatic.  Right Ear: External ear normal.  Left Ear: External ear normal.  Nose: Nose normal.  Mouth/Throat: Oropharynx is clear and moist.  Eyes: Conjunctivae and EOM are normal. Pupils are equal, round, and reactive to light.  Neck: Normal range of motion. Neck supple. Carotid bruit is not present. No thyromegaly present.  Cardiovascular: Normal rate, regular rhythm, normal heart sounds and intact distal pulses.  Exam reveals no gallop and no friction rub.   No murmur heard. Pulmonary/Chest: Effort normal and breath sounds normal. He has no wheezes. He has no rales.  Abdominal: Soft. Bowel sounds are normal. He exhibits no distension and no mass. There is no tenderness. There is no rebound and no guarding.  Musculoskeletal:       Right wrist: Normal.       Cervical back: He exhibits decreased range of motion, tenderness, bony tenderness, pain and spasm.       Right hand: He exhibits decreased range of motion, tenderness and swelling. Normal sensation noted. Normal strength noted.  Lymphadenopathy:    He has no cervical adenopathy.    Neurological: He is alert and oriented to person, place, and time. No cranial nerve deficit.  Skin: Skin is warm and dry. No rash noted. He is not diaphoretic.  Psychiatric: He has a normal mood and affect. His behavior is normal.  Nursing note and vitals reviewed.  Results for orders placed or performed in visit on 01/13/16  Comprehensive metabolic panel  Result Value Ref Range   Sodium 139 135 - 146 mmol/L   Potassium 3.7 3.5 - 5.3 mmol/L   Chloride 102 98 - 110 mmol/L   CO2 32 (H) 20 - 31 mmol/L   Glucose, Bld 131 (H) 65 - 99 mg/dL   BUN 14 7 - 25 mg/dL   Creat 1.14 0.70 - 1.33 mg/dL  Total Bilirubin 0.5 0.2 - 1.2 mg/dL   Alkaline Phosphatase 58 40 - 115 U/L   AST 26 10 - 35 U/L   ALT 38 9 - 46 U/L   Total Protein 7.0 6.1 - 8.1 g/dL   Albumin 4.2 3.6 - 5.1 g/dL   Calcium 9.3 8.6 - 10.3 mg/dL  Sedimentation rate  Result Value Ref Range   Sed Rate 1 0 - 20 mm/hr  POCT CBC  Result Value Ref Range   WBC 6.9 4.6 - 10.2 K/uL   Lymph, poc 1.9 0.6 - 3.4   POC LYMPH PERCENT 27.6 10 - 50 %L   MID (cbc) 0.4 0 - 0.9   POC MID % 5.7 0 - 12 %M   POC Granulocyte 4.6 2 - 6.9   Granulocyte percent 66.7 37 - 80 %G   RBC 5.39 4.69 - 6.13 M/uL   Hemoglobin 16.1 14.1 - 18.1 g/dL   HCT, POC 47.6 43.5 - 53.7 %   MCV 88.3 80 - 97 fL   MCH, POC 29.9 27 - 31.2 pg   MCHC 33.8 31.8 - 35.4 g/dL   RDW, POC 12.9 %   Platelet Count, POC 313 142 - 424 K/uL   MPV 6.4 0 - 99.8 fL  POCT glucose (manual entry)  Result Value Ref Range   POC Glucose 130 (A) 70 - 99 mg/dl  POCT glycosylated hemoglobin (Hb A1C)  Result Value Ref Range   Hemoglobin A1C 6.3    [facility] reading (PRIMARY) by  Dr. Tamala Julian.  CERVICAL SPINE FILMS:  +DIFFUSE SPURRING .  R SECOND DIGIT: DEGENERATIVE CHANGES; NO ACUTE PROCESS.       Assessment & Plan:   1. Right hand pain   2. Neck pain   3. Essential hypertension, benign   4. Glucose intolerance (impaired glucose tolerance)   5. Psoriasis   6. Swelling of right hand   7.  Asthma with acute exacerbation, mild persistent     Orders Placed This Encounter  Procedures  . DG Cervical Spine Complete    Standing Status: Future     Number of Occurrences: 1     Standing Expiration Date: 01/12/2017    Order Specific Question:  Reason for Exam (SYMPTOM  OR DIAGNOSIS REQUIRED)    Answer:  R MCP swelling and pain; history of psoriatic arthritis    Order Specific Question:  Preferred imaging location?    Answer:  External  . DG Finger Index Right    Standing Status: Future     Number of Occurrences: 1     Standing Expiration Date: 01/12/2017    Order Specific Question:  Reason for Exam (SYMPTOM  OR DIAGNOSIS REQUIRED)    Answer:  R MCP swelling and pain; history of psoriatic arthritis    Order Specific Question:  Preferred imaging location?    Answer:  External  . Comprehensive metabolic panel  . Sedimentation rate  . POCT CBC  . POCT glucose (manual entry)  . POCT glycosylated hemoglobin (Hb A1C)   Meds ordered this encounter  Medications  . predniSONE (DELTASONE) 20 MG tablet    Sig: Three tablets daily x 2 days then two tablets daily x 3 days then one tablet daily x 3 days    Dispense:  15 tablet    Refill:  0  . methocarbamol (ROBAXIN) 500 MG tablet    Sig: Take 1-2 tablets (500-1,000 mg total) by mouth at bedtime.    Dispense:  30 tablet  Refill:  0  . methylPREDNISolone sodium succinate (SOLU-MEDROL) 125 mg/2 mL injection 80 mg    Sig:     No Follow-up on file.    Rickey Cruz, M.D. [facility] [address] [place], [place]  [address] [phone] phone [phone] fax

## 2016-01-14 LAB — COMPREHENSIVE METABOLIC PANEL
ALT: 38 U/L (ref 9–46)
AST: 26 U/L (ref 10–35)
Albumin: 4.2 g/dL (ref 3.6–5.1)
Alkaline Phosphatase: 58 U/L (ref 40–115)
BUN: 14 mg/dL (ref 7–25)
CO2: 32 mmol/L — ABNORMAL HIGH (ref 20–31)
Calcium: 9.3 mg/dL (ref 8.6–10.3)
Chloride: 102 mmol/L (ref 98–110)
Creat: 1.14 mg/dL (ref 0.70–1.33)
Glucose, Bld: 131 mg/dL — ABNORMAL HIGH (ref 65–99)
Potassium: 3.7 mmol/L (ref 3.5–5.3)
Sodium: 139 mmol/L (ref 135–146)
Total Bilirubin: 0.5 mg/dL (ref 0.2–1.2)
Total Protein: 7 g/dL (ref 6.1–8.1)

## 2016-01-15 ENCOUNTER — Other Ambulatory Visit: Payer: Self-pay | Admitting: Family Medicine

## 2016-01-15 LAB — SEDIMENTATION RATE: SED RATE: 1 mm/h (ref 0–20)

## 2016-01-15 MED ORDER — ROSUVASTATIN CALCIUM 10 MG PO TABS: 10.0000 mg | ORAL_TABLET | Freq: Every day | ORAL | Status: DC

## 2016-01-20 ENCOUNTER — Encounter: Payer: Self-pay | Admitting: Family Medicine

## 2016-01-27 ENCOUNTER — Ambulatory Visit (INDEPENDENT_AMBULATORY_CARE_PROVIDER_SITE_OTHER): Payer: Worker's Compensation | Admitting: Physician Assistant

## 2016-01-27 VITALS — BP 128/80 | HR 72 | Temp 98.4°F | Resp 17 | Ht 65.5 in | Wt 188.0 lb

## 2016-01-27 DIAGNOSIS — Z8709 Personal history of other diseases of the respiratory system: Secondary | ICD-10-CM

## 2016-01-27 DIAGNOSIS — Z77098 Contact with and (suspected) exposure to other hazardous, chiefly nonmedicinal, chemicals: Secondary | ICD-10-CM | POA: Diagnosis not present

## 2016-01-27 MED ORDER — ALBUTEROL SULFATE (2.5 MG/3ML) 0.083% IN NEBU
2.5000 mg | INHALATION_SOLUTION | Freq: Once | RESPIRATORY_TRACT | Status: AC
  Administered 2016-01-27: 2.5 mg via RESPIRATORY_TRACT

## 2016-01-27 MED ORDER — IPRATROPIUM BROMIDE 0.02 % IN SOLN
0.5000 mg | Freq: Once | RESPIRATORY_TRACT | Status: AC
  Administered 2016-01-27: 0.5 mg via RESPIRATORY_TRACT

## 2016-01-27 NOTE — Progress Notes (Signed)
01/27/2016 4:42 PM   DOB: 01/16/1961 / MRN: [id number]  SUBJECTIVE:  Rickey Cruz is a 55 y.o. male presenting for SOB after an exposure to aerosolized cleaning chemical today.  He has a history of asthma.  He has tried using his albuterol inhaler with some relief.  He feels much better now given that this happened about 4 hours ago.    He has No Known Allergies.   He  has a past medical history of Asthma; Hyperlipidemia; Hypertension; Psoriasis; History of kidney stones; Colon polyp (10/27/2011); Allergy; Arthritis; Tuberculosis; Low back pain; Depression; GERD (gastroesophageal reflux disease); and Borderline diabetes.    He  reports that he has never smoked. He has never used smokeless tobacco. He reports that he does not drink alcohol or use illicit drugs. He  reports that he currently engages in sexual activity. The patient  has past surgical history that includes Colonoscopy (10/27/2011); Polypectomy; Septoplasty (2014); Prostate biopsy (10/2014); Appendectomy; Tonsillectomy (2014); Uvulopalatopharyngoplasty (2014); and Cholecystectomy (N/A, 12/16/2014).  His family history is negative for Colon cancer, Esophageal cancer, Stomach cancer, Prostate cancer, Diabetes, and CAD.  Review of Systems  Constitutional: Negative for fever and chills.  Eyes: Negative for blurred vision.  Respiratory: Negative for cough and shortness of breath.   Cardiovascular: Negative for chest pain.  Gastrointestinal: Negative for nausea and abdominal pain.  Genitourinary: Negative for dysuria, urgency and frequency.  Musculoskeletal: Negative for myalgias.  Skin: Negative for rash.  Neurological: Negative for dizziness, tingling and headaches.  Psychiatric/Behavioral: Negative for depression. The patient is not nervous/anxious.     Problem list and medications reviewed and updated by myself where necessary, and exist elsewhere in the encounter.   OBJECTIVE:  BP 128/80 mmHg  Pulse 72  Temp(Src) 98.4 F (36.9  C) (Oral)  Resp 17  Ht 5' 5.5" (1.664 m)  Wt 188 lb (85.276 kg)  BMI 30.80 kg/m2  SpO2 98%  Physical Exam  Constitutional: He is oriented to person, place, and time. He appears well-developed. He does not appear ill.  Eyes: Conjunctivae and EOM are normal. Pupils are equal, round, and reactive to light.  Cardiovascular: Normal rate.   Pulmonary/Chest: Effort normal and breath sounds normal. No respiratory distress. He has no wheezes. He has no rales. He exhibits no tenderness.  Abdominal: He exhibits no distension.  Musculoskeletal: Normal range of motion.  Neurological: He is alert and oriented to person, place, and time. No cranial nerve deficit. Coordination normal.  Skin: Skin is warm and dry. He is not diaphoretic.  Psychiatric: He has a normal mood and affect.  Nursing note and vitals reviewed.   No results found for this or any previous visit (from the past 72 hour(s)).  No results found.  ASSESSMENT AND PLAN  Rickey Cruz was seen today for allergic reaction and shortness of breath.  Diagnoses and all orders for this visit:  History of asthma  Chemical exposure: His exam and vitals are normal and he is well appearing.  Will provide some nebs in the office however I can find nothing wrong with him.  Advised that he return as needed.   -     albuterol (PROVENTIL) (2.5 MG/3ML) 0.083% nebulizer solution 2.5 mg; Take 3 mLs (2.5 mg total) by nebulization once. -     ipratropium (ATROVENT) nebulizer solution 0.5 mg; Take 2.5 mLs (0.5 mg total) by nebulization once.   The patient was advised to call or return to clinic if he does not see an improvement in symptoms or  to seek the care of the closest emergency department if he worsens with the above plan.   Philis Fendt, MHS, PA-C [facility] 01/27/2016 4:42 PM

## 2016-02-03 ENCOUNTER — Ambulatory Visit (INDEPENDENT_AMBULATORY_CARE_PROVIDER_SITE_OTHER): Payer: BC Managed Care – PPO | Admitting: Allergy and Immunology

## 2016-02-03 ENCOUNTER — Encounter: Payer: Self-pay | Admitting: Allergy and Immunology

## 2016-02-03 VITALS — BP 112/70 | HR 78 | Resp 16

## 2016-02-03 DIAGNOSIS — H1010 Acute atopic conjunctivitis, unspecified eye: Secondary | ICD-10-CM

## 2016-02-03 DIAGNOSIS — J309 Allergic rhinitis, unspecified: Secondary | ICD-10-CM | POA: Diagnosis not present

## 2016-02-03 DIAGNOSIS — J4530 Mild persistent asthma, uncomplicated: Secondary | ICD-10-CM

## 2016-02-03 NOTE — Progress Notes (Signed)
     FOLLOW UP NOTE  RE: Rickey Cruz MRN: [id number] DOB: [birth year] [facility] [place] [address] [id number] Date of Office Visit: 02/03/2016  Subjective:  Rickey Cruz is a 55 y.o. male who presents today for Asthma  Assessment:   1. Allergic rhinoconjunctivitis.   2. Mild persistent asthma, improved control from recent exacerbation.    3.     Complex medical history. Plan:    Patient Instructions  1.  Continue Singulair, QVAR, Rhinocort and Zyrtec daily. 2.  As needed Patanol one drop twice daily. 3.  ProAir 2 puffs every 4 hours as needed. 4.  Call with any recurring ProAir use.  5.  Follow-up in 6 months or sooner if needed.  HPI: Rickey Cruz returns to the office in follow-up of asthma and allergic rhinoconjunctivitis.  Since his visit in January, he reports great improvement with normal sleep and activity including walking regularly.  He is maintaining on the preventative medication regime and feels meds are beneficial.  He denies congestion, sneezing, rhinorrhea, cough, wheeze or any chest symptoms.  He notes rare, itchy eyes and is requesting an eyedrop in anticipation for any season .  He reports no recent [employer] use.  Last week, Rickey Cruz had an unusual chemical spray exposure at work which provoked a headache and subsequent evaluation by his primary care physician, with apparent shortness of breath.  He reports feeling well and no apparent persisting difficulty, continues to follow with the physicians at [facility] care center.  He did not have to uses albuterol at that time.  He is very pleased with how well he has done.  Denies other new medical issues. Reports sleep and activity are normal.  Rickey Cruz has a current medication list which includes the following prescription(s): albuterol, amlodipine, beclomethasone, budesonide, clobetasol, clobetasol ointment, fluocinolone, fluocinolone acetonide scalp, hydrochlorothiazide, montelukast, and  rosuvastatin.  Drug Allergies: No Known Allergies  Objective:   Filed Vitals:   02/03/16 1609  BP: 112/70  Pulse: 78  Resp: 16   Physical Exam  Constitutional: He is well-developed, well-nourished, and in no distress.  HENT:  Head: Atraumatic.  Right Ear: Tympanic membrane and ear canal normal.  Left Ear: Tympanic membrane and ear canal normal.  Nose: Mucosal edema (Minimal without mucus.) present. No rhinorrhea. No epistaxis.  Mouth/Throat: Oropharynx is clear and moist and mucous membranes are normal. No oropharyngeal exudate, posterior oropharyngeal edema or posterior oropharyngeal erythema.  Eyes: Conjunctivae are normal.  Neck: Neck supple.  Cardiovascular: Normal rate, S1 normal and S2 normal.   No murmur heard. Pulmonary/Chest: Effort normal and breath sounds normal. He has no wheezes. He has no rhonchi. He has no rales.  Lymphadenopathy:    He has no cervical adenopathy.  Skin: Skin is warm and intact. No rash noted. No cyanosis. Nails show no clubbing.   Diagnostics: Spirometry:  FVC 3.67--86%, FEV1 3.11--94%.    Rickey Cruz M. Ishmael Holter, MD  cc: [name], MD

## 2016-02-03 NOTE — Patient Instructions (Addendum)
   Continue Singulair, QVAR, Rhinocort and Zyrtec daily.  As needed Patanol one drop twice daily.  ProAir 2 puffs every 4 hours as needed.  Call with any recurring ProAir use.   Follow-up in 6 months or sooner if needed.

## 2016-02-07 ENCOUNTER — Other Ambulatory Visit: Payer: Self-pay | Admitting: Family Medicine

## 2016-02-09 ENCOUNTER — Ambulatory Visit: Payer: BC Managed Care – PPO | Admitting: Gastroenterology

## 2016-02-11 ENCOUNTER — Other Ambulatory Visit: Payer: Self-pay | Admitting: Family Medicine

## 2016-02-27 ENCOUNTER — Telehealth: Payer: Self-pay

## 2016-02-27 DIAGNOSIS — H00016 Hordeolum externum left eye, unspecified eyelid: Secondary | ICD-10-CM

## 2016-02-27 NOTE — Telephone Encounter (Signed)
PT IS REQUESTING A REFILL OF montelukast (SINGULAIR) 10 MG tablet AND THE SHAMPOO FOR PSORIASIS THAT WAS PRESCRIBED IN THE PAST.  [place]

## 2016-02-27 NOTE — Telephone Encounter (Signed)
Patient is calling because he hasn't heard anything. He states that he's been coughing a lot and needs medication today.  I informed patient of the 24-48 hour turn around time and that he's welcome to come to the walk in if he needs medication right away.

## 2016-02-29 MED ORDER — KETOCONAZOLE 2 % EX SHAM: 1.0000 "application " | MEDICATED_SHAMPOO | CUTANEOUS | Status: DC

## 2016-02-29 MED ORDER — MONTELUKAST SODIUM 10 MG PO TABS: 10.0000 mg | ORAL_TABLET | Freq: Every day | ORAL | Status: DC

## 2016-02-29 NOTE — Telephone Encounter (Signed)
Left message for pt to call back.  I need name of shampoo.

## 2016-02-29 NOTE — Telephone Encounter (Signed)
Singulair and Nizoral shampoo sent to pharmacy.

## 2016-02-29 NOTE — Telephone Encounter (Signed)
ketoconazole (NIZORAL) 2 % shampoo [id number] DISCONTINUED      Order Details    Dose: 1 application Route: Topical Frequency: 2 times weekly   Dispense Quantity:  -- Refills:  -- Fills Remaining:  --          Sig: Apply 1 application topically 2 (two) times a week.         Discontinue Date:  12/10/2014 [id number] Discontinue User:  Criss Rosales, CPHT Discontinue Reason:  Therapy completed   Written Date:  -- Expiration Date:  -- Ordering Date:  06/30/14    Start Date:  -- End Date:  12/10/14     Ordering Provider:  -- Authorizing Provider:  Historical Provider, MD Ordering User:  Ewing Schlein, CMA

## 2016-02-29 NOTE — Telephone Encounter (Signed)
Spoke with pt, he would like to have the Nizoral shampoo sent in. Is this ok? I don' think we prescribed this to him before. Please advise.

## 2016-03-06 ENCOUNTER — Other Ambulatory Visit: Payer: Self-pay | Admitting: Emergency Medicine

## 2016-04-03 ENCOUNTER — Other Ambulatory Visit: Payer: Self-pay | Admitting: Family Medicine

## 2016-04-09 ENCOUNTER — Other Ambulatory Visit: Payer: Self-pay | Admitting: Physician Assistant

## 2016-04-09 ENCOUNTER — Other Ambulatory Visit: Payer: Self-pay | Admitting: Family Medicine

## 2016-05-19 ENCOUNTER — Ambulatory Visit (INDEPENDENT_AMBULATORY_CARE_PROVIDER_SITE_OTHER): Payer: BC Managed Care – PPO | Admitting: Family Medicine

## 2016-05-19 ENCOUNTER — Ambulatory Visit (INDEPENDENT_AMBULATORY_CARE_PROVIDER_SITE_OTHER): Payer: BC Managed Care – PPO

## 2016-05-19 VITALS — BP 122/80 | HR 68 | Temp 98.3°F | Resp 18 | Ht 65.5 in | Wt 188.0 lb

## 2016-05-19 DIAGNOSIS — J4520 Mild intermittent asthma, uncomplicated: Secondary | ICD-10-CM

## 2016-05-19 DIAGNOSIS — N400 Enlarged prostate without lower urinary tract symptoms: Secondary | ICD-10-CM

## 2016-05-19 DIAGNOSIS — Z23 Encounter for immunization: Secondary | ICD-10-CM | POA: Diagnosis not present

## 2016-05-19 DIAGNOSIS — Z1159 Encounter for screening for other viral diseases: Secondary | ICD-10-CM

## 2016-05-19 DIAGNOSIS — M545 Low back pain, unspecified: Secondary | ICD-10-CM

## 2016-05-19 DIAGNOSIS — R7302 Impaired glucose tolerance (oral): Secondary | ICD-10-CM | POA: Diagnosis not present

## 2016-05-19 DIAGNOSIS — E785 Hyperlipidemia, unspecified: Secondary | ICD-10-CM

## 2016-05-19 LAB — POCT URINALYSIS DIP (MANUAL ENTRY)
BILIRUBIN UA: NEGATIVE
BILIRUBIN UA: NEGATIVE
Blood, UA: NEGATIVE
Glucose, UA: NEGATIVE
LEUKOCYTES UA: NEGATIVE
Nitrite, UA: NEGATIVE
Spec Grav, UA: 1.015
Urobilinogen, UA: 0.2
pH, UA: 6

## 2016-05-19 LAB — LIPID PANEL
CHOL/HDL RATIO: 3.2 ratio (ref <=5.0)
CHOLESTEROL: 121 mg/dL — AB (ref 125–200)
HDL: 38 mg/dL — AB (ref >=40)
LDL Cholesterol: 53 mg/dL (ref <130)
TRIGLYCERIDES: 148 mg/dL (ref <150)
VLDL: 30 mg/dL (ref <30)

## 2016-05-19 LAB — CBC WITH DIFFERENTIAL/PLATELET
BASOS ABS: 45 {cells}/uL (ref 0–200)
Basophils Relative: 1 %
EOS ABS: 135 {cells}/uL (ref 15–500)
Eosinophils Relative: 3 %
HEMATOCRIT: 49.5 % (ref 38.5–50.0)
HEMOGLOBIN: 17 g/dL (ref 13.2–17.1)
Lymphocytes Relative: 23 %
Lymphs Abs: 1035 cells/uL (ref 850–3900)
MCH: 30 pg (ref 27.0–33.0)
MCHC: 34.3 g/dL (ref 32.0–36.0)
MCV: 87.3 fL (ref 80.0–100.0)
MONOS PCT: 11 %
MPV: 9.3 fL (ref 7.5–12.5)
Monocytes Absolute: 495 cells/uL (ref 200–950)
NEUTROS ABS: 2790 {cells}/uL (ref 1500–7800)
Neutrophils Relative %: 62 %
PLATELETS: 258 10*3/uL (ref 140–400)
RBC: 5.67 MIL/uL (ref 4.20–5.80)
RDW: 13 % (ref 11.0–15.0)
WBC: 4.5 10*3/uL (ref 3.8–10.8)

## 2016-05-19 LAB — COMPREHENSIVE METABOLIC PANEL
ALBUMIN: 4.4 g/dL (ref 3.6–5.1)
ALT: 35 U/L (ref 9–46)
AST: 23 U/L (ref 10–35)
Alkaline Phosphatase: 49 U/L (ref 40–115)
BUN: 13 mg/dL (ref 7–25)
CALCIUM: 9.1 mg/dL (ref 8.6–10.3)
CHLORIDE: 103 mmol/L (ref 98–110)
CO2: 30 mmol/L (ref 20–31)
Creat: 1.04 mg/dL (ref 0.70–1.33)
Glucose, Bld: 98 mg/dL (ref 65–99)
POTASSIUM: 3.7 mmol/L (ref 3.5–5.3)
Sodium: 142 mmol/L (ref 135–146)
TOTAL PROTEIN: 7.3 g/dL (ref 6.1–8.1)
Total Bilirubin: 0.7 mg/dL (ref 0.2–1.2)

## 2016-05-19 LAB — POCT GLYCOSYLATED HEMOGLOBIN (HGB A1C): Hemoglobin A1C: 5.7

## 2016-05-19 LAB — MICROALBUMIN, URINE: Microalb, Ur: 1.7 mg/dL

## 2016-05-19 LAB — HEPATITIS C ANTIBODY: HCV Ab: NEGATIVE

## 2016-05-19 MED ORDER — METHOCARBAMOL 500 MG PO TABS: 500.0000 mg | ORAL_TABLET | Freq: Four times a day (QID) | ORAL | Status: DC | PRN

## 2016-05-19 NOTE — Patient Instructions (Addendum)
IF LOWER BACK PAIN HAS NOT IMPROVED SIGNIFICANTLY IN TWO WEEKS, PLEASE CALL TO SCHEDULE PHYSICAL THERAPY.    IF you received an x-ray today, you will receive an invoice from [facility]. Please contact [facility] at [phone] with questions or concerns regarding your invoice.   IF you received labwork today, you will receive an invoice from [employer]. Please contact [facility] at [phone] with questions or concerns regarding your invoice.   Our billing staff will not be able to assist you with questions regarding bills from these companies.  You will be contacted with the lab results as soon as they are available. The fastest way to get your results is to activate your My Chart account. Instructions are located on the last page of this paperwork. If you have not heard from [place] regarding the results in 2 weeks, please contact this office.    Low Back Sprain With Rehab A sprain is an injury in which a ligament is torn. The ligaments of the lower back are vulnerable to sprains. However, they are strong and require great force to be injured. These ligaments are important for stabilizing the spinal column. Sprains are classified into three categories. Grade 1 sprains cause pain, but the tendon is not lengthened. Grade 2 sprains include a lengthened ligament, due to the ligament being stretched or partially ruptured. With grade 2 sprains there is still function, although the function may be decreased. Grade 3 sprains involve a complete tear of the tendon or muscle, and function is usually impaired. SYMPTOMS   Severe pain in the lower back.  Sometimes, a feeling of a "pop," "snap," or tear, at the time of injury.  Tenderness and sometimes swelling at the injury site.  Uncommonly, bruising (contusion) within 48 hours of injury.  Muscle spasms in the back. CAUSES  Low back sprains occur when a force is placed on the ligaments that is greater than  they can handle. Common causes of injury include:  Performing a stressful act while off-balance.  Repetitive stressful activities that involve movement of the lower back.  Direct hit (trauma) to the lower back. RISK INCREASES WITH:  Contact sports (football, wrestling).  Collisions (major skiing accidents).  Sports that require throwing or lifting (baseball, weightlifting).  Sports involving twisting of the spine (gymnastics, diving, tennis, golf).  Poor strength and flexibility.  Inadequate protection.  Previous back injury or surgery (especially fusion). PREVENTION  Wear properly fitted and padded protective equipment.  Warm up and stretch properly before activity.  Allow for adequate recovery between workouts.  Maintain physical fitness:  Strength, flexibility, and endurance.  Cardiovascular fitness.  Maintain a healthy body weight. PROGNOSIS  If treated properly, low back sprains usually heal with non-surgical treatment. The length of time for healing depends on the severity of the injury.  RELATED COMPLICATIONS   Recurring symptoms, resulting in a chronic problem.  Chronic inflammation and pain in the low back.  Delayed healing or resolution of symptoms, especially if activity is resumed too soon.  Prolonged impairment.  Unstable or arthritic joints of the low back. TREATMENT  Treatment first involves the use of ice and medicine, to reduce pain and inflammation. The use of strengthening and stretching exercises may help reduce pain with activity. These exercises may be performed at home or with a therapist. Severe injuries may require referral to a therapist for further evaluation and treatment, such as ultrasound. Your caregiver may advise that you wear a back brace or corset, to help reduce  pain and discomfort. Often, prolonged bed rest results in greater harm then benefit. Corticosteroid injections may be recommended. However, these should be reserved for  the most serious cases. It is important to avoid using your back when lifting objects. At night, sleep on your back on a firm mattress, with a pillow placed under your knees. If non-surgical treatment is unsuccessful, surgery may be needed.  MEDICATION   If pain medicine is needed, nonsteroidal anti-inflammatory medicines (aspirin and ibuprofen), or other minor pain relievers (acetaminophen), are often advised.  Do not take pain medicine for 7 days before surgery.  Prescription pain relievers may be given, if your caregiver thinks they are needed. Use only as directed and only as much as you need.  Ointments applied to the skin may be helpful.  Corticosteroid injections may be given by your caregiver. These injections should be reserved for the most serious cases, because they may only be given a certain number of times. HEAT AND COLD  Cold treatment (icing) should be applied for 10 to 15 minutes every 2 to 3 hours for inflammation and pain, and immediately after activity that aggravates your symptoms. Use ice packs or an ice massage.  Heat treatment may be used before performing stretching and strengthening activities prescribed by your caregiver, physical therapist, or athletic trainer. Use a heat pack or a warm water soak. SEEK MEDICAL CARE IF:   Symptoms get worse or do not improve in 2 to 4 weeks, despite treatment.  You develop numbness or weakness in either leg.  You lose bowel or bladder function.  Any of the following occur after surgery: fever, increased pain, swelling, redness, drainage of fluids, or bleeding in the affected area.  New, unexplained symptoms develop. (Drugs used in treatment may produce side effects.) EXERCISES  RANGE OF MOTION (ROM) AND STRETCHING EXERCISES - Low Back Sprain Most people with lower back pain will find that their symptoms get worse with excessive bending forward (flexion) or arching at the lower back (extension). The exercises that will help  resolve your symptoms will focus on the opposite motion.  Your physician, physical therapist or athletic trainer will help you determine which exercises will be most helpful to resolve your lower back pain. Do not complete any exercises without first consulting with your caregiver. Discontinue any exercises which make your symptoms worse, until you speak to your caregiver. If you have pain, numbness or tingling which travels down into your buttocks, leg or foot, the goal of the therapy is for these symptoms to move closer to your back and eventually resolve. Sometimes, these leg symptoms will get better, but your lower back pain may worsen. This is often an indication of progress in your rehabilitation. Be very alert to any changes in your symptoms and the activities in which you participated in the 24 hours prior to the change. Sharing this information with your caregiver will allow him or her to most efficiently treat your condition. These exercises may help you when beginning to rehabilitate your injury. Your symptoms may resolve with or without further involvement from your physician, physical therapist or athletic trainer. While completing these exercises, remember:   Restoring tissue flexibility helps normal motion to return to the joints. This allows healthier, less painful movement and activity.  An effective stretch should be held for at least 30 seconds.  A stretch should never be painful. You should only feel a gentle lengthening or release in the stretched tissue. FLEXION RANGE OF MOTION AND STRETCHING EXERCISES: STRETCH -  Flexion, Single Knee to Chest   Lie on a firm bed or floor with both legs extended in front of you.  Keeping one leg in contact with the floor, bring your opposite knee to your chest. Hold your leg in place by either grabbing behind your thigh or at your knee.  Pull until you feel a gentle stretch in your low back. Hold __________ seconds.  Slowly release your grasp  and repeat the exercise with the opposite side. Repeat __________ times. Complete this exercise __________ times per day.  STRETCH - Flexion, Double Knee to Chest  Lie on a firm bed or floor with both legs extended in front of you.  Keeping one leg in contact with the floor, bring your opposite knee to your chest.  Tense your stomach muscles to support your back and then lift your other knee to your chest. Hold your legs in place by either grabbing behind your thighs or at your knees.  Pull both knees toward your chest until you feel a gentle stretch in your low back. Hold __________ seconds.  Tense your stomach muscles and slowly return one leg at a time to the floor. Repeat __________ times. Complete this exercise __________ times per day.  STRETCH - Low Trunk Rotation  Lie on a firm bed or floor. Keeping your legs in front of you, bend your knees so they are both pointed toward the ceiling and your feet are flat on the floor.  Extend your arms out to the side. This will stabilize your upper body by keeping your shoulders in contact with the floor.  Gently and slowly drop both knees together to one side until you feel a gentle stretch in your low back. Hold for __________ seconds.  Tense your stomach muscles to support your lower back as you bring your knees back to the starting position. Repeat the exercise to the other side. Repeat __________ times. Complete this exercise __________ times per day  EXTENSION RANGE OF MOTION AND FLEXIBILITY EXERCISES: STRETCH - Extension, Prone on Elbows   Lie on your stomach on the floor, a bed will be too soft. Place your palms about shoulder width apart and at the height of your head.  Place your elbows under your shoulders. If this is too painful, stack pillows under your chest.  Allow your body to relax so that your hips drop lower and make contact more completely with the floor.  Hold this position for __________ seconds.  Slowly return to  lying flat on the floor. Repeat __________ times. Complete this exercise __________ times per day.  RANGE OF MOTION - Extension, Prone Press Ups  Lie on your stomach on the floor, a bed will be too soft. Place your palms about shoulder width apart and at the height of your head.  Keeping your back as relaxed as possible, slowly straighten your elbows while keeping your hips on the floor. You may adjust the placement of your hands to maximize your comfort. As you gain motion, your hands will come more underneath your shoulders.  Hold this position __________ seconds.  Slowly return to lying flat on the floor. Repeat __________ times. Complete this exercise __________ times per day.  RANGE OF MOTION- Quadruped, Neutral Spine   Assume a hands and knees position on a firm surface. Keep your hands under your shoulders and your knees under your hips. You may place padding under your knees for comfort.  Drop your head and point your tailbone toward the ground below  you. This will round out your lower back like an angry cat. Hold this position for __________ seconds.  Slowly lift your head and release your tail bone so that your back sags into a large arch, like an old horse.  Hold this position for __________ seconds.  Repeat this until you feel limber in your low back.  Now, find your "sweet spot." This will be the most comfortable position somewhere between the two previous positions. This is your neutral spine. Once you have found this position, tense your stomach muscles to support your low back.  Hold this position for __________ seconds. Repeat __________ times. Complete this exercise __________ times per day.  STRENGTHENING EXERCISES - Low Back Sprain These exercises may help you when beginning to rehabilitate your injury. These exercises should be done near your "sweet spot." This is the neutral, low-back arch, somewhere between fully rounded and fully arched, that is your least painful  position. When performed in this safe range of motion, these exercises can be used for people who have either a flexion or extension based injury. These exercises may resolve your symptoms with or without further involvement from your physician, physical therapist or athletic trainer. While completing these exercises, remember:   Muscles can gain both the endurance and the strength needed for everyday activities through controlled exercises.  Complete these exercises as instructed by your physician, physical therapist or athletic trainer. Increase the resistance and repetitions only as guided.  You may experience muscle soreness or fatigue, but the pain or discomfort you are trying to eliminate should never worsen during these exercises. If this pain does worsen, stop and make certain you are following the directions exactly. If the pain is still present after adjustments, discontinue the exercise until you can discuss the trouble with your caregiver. STRENGTHENING - Deep Abdominals, Pelvic Tilt   Lie on a firm bed or floor. Keeping your legs in front of you, bend your knees so they are both pointed toward the ceiling and your feet are flat on the floor.  Tense your lower abdominal muscles to press your low back into the floor. This motion will rotate your pelvis so that your tail bone is scooping upwards rather than pointing at your feet or into the floor. With a gentle tension and even breathing, hold this position for __________ seconds. Repeat __________ times. Complete this exercise __________ times per day.  STRENGTHENING - Abdominals, Crunches   Lie on a firm bed or floor. Keeping your legs in front of you, bend your knees so they are both pointed toward the ceiling and your feet are flat on the floor. Cross your arms over your chest.  Slightly tip your chin down without bending your neck.  Tense your abdominals and slowly lift your trunk high enough to just clear your shoulder blades.  Lifting higher can put excessive stress on the lower back and does not further strengthen your abdominal muscles.  Control your return to the starting position. Repeat __________ times. Complete this exercise __________ times per day.  STRENGTHENING - Quadruped, Opposite UE/LE Lift   Assume a hands and knees position on a firm surface. Keep your hands under your shoulders and your knees under your hips. You may place padding under your knees for comfort.  Find your neutral spine and gently tense your abdominal muscles so that you can maintain this position. Your shoulders and hips should form a rectangle that is parallel with the floor and is not twisted.  Keeping your  trunk steady, lift your right hand no higher than your shoulder and then your left leg no higher than your hip. Make sure you are not holding your breath. Hold this position for __________ seconds.  Continuing to keep your abdominal muscles tense and your back steady, slowly return to your starting position. Repeat with the opposite arm and leg. Repeat __________ times. Complete this exercise __________ times per day.  STRENGTHENING - Abdominals and Quadriceps, Straight Leg Raise   Lie on a firm bed or floor with both legs extended in front of you.  Keeping one leg in contact with the floor, bend the other knee so that your foot can rest flat on the floor.  Find your neutral spine, and tense your abdominal muscles to maintain your spinal position throughout the exercise.  Slowly lift your straight leg off the floor about 6 inches for a count of 15, making sure to not hold your breath.  Still keeping your neutral spine, slowly lower your leg all the way to the floor. Repeat this exercise with each leg __________ times. Complete this exercise __________ times per day. POSTURE AND BODY MECHANICS CONSIDERATIONS - Low Back Sprain Keeping correct posture when sitting, standing or completing your activities will reduce the stress  put on different body tissues, allowing injured tissues a chance to heal and limiting painful experiences. The following are general guidelines for improved posture. Your physician or physical therapist will provide you with any instructions specific to your needs. While reading these guidelines, remember:  The exercises prescribed by your provider will help you have the flexibility and strength to maintain correct postures.  The correct posture provides the best environment for your joints to work. All of your joints have less wear and tear when properly supported by a spine with good posture. This means you will experience a healthier, less painful body.  Correct posture must be practiced with all of your activities, especially prolonged sitting and standing. Correct posture is as important when doing repetitive low-stress activities (typing) as it is when doing a single heavy-load activity (lifting). RESTING POSITIONS Consider which positions are most painful for you when choosing a resting position. If you have pain with flexion-based activities (sitting, bending, stooping, squatting), choose a position that allows you to rest in a less flexed posture. You would want to avoid curling into a fetal position on your side. If your pain worsens with extension-based activities (prolonged standing, working overhead), avoid resting in an extended position such as sleeping on your stomach. Most people will find more comfort when they rest with their spine in a more neutral position, neither too rounded nor too arched. Lying on a non-sagging bed on your side with a pillow between your knees, or on your back with a pillow under your knees will often provide some relief. Keep in mind, being in any one position for a prolonged period of time, no matter how correct your posture, can still lead to stiffness. PROPER SITTING POSTURE In order to minimize stress and discomfort on your spine, you must sit with correct  posture. Sitting with good posture should be effortless for a healthy body. Returning to good posture is a gradual process. Many people can work toward this most comfortably by using various supports until they have the flexibility and strength to maintain this posture on their own. When sitting with proper posture, your ears will fall over your shoulders and your shoulders will fall over your hips. You should use the back of  the chair to support your upper back. Your lower back will be in a neutral position, just slightly arched. You may place a small pillow or folded towel at the base of your lower back for  support.  When working at a desk, create an environment that supports good, upright posture. Without extra support, muscles tire, which leads to excessive strain on joints and other tissues. Keep these recommendations in mind: CHAIR:  A chair should be able to slide under your desk when your back makes contact with the back of the chair. This allows you to work closely.  The chair's height should allow your eyes to be level with the upper part of your monitor and your hands to be slightly lower than your elbows. BODY POSITION  Your feet should make contact with the floor. If this is not possible, use a foot rest.  Keep your ears over your shoulders. This will reduce stress on your neck and low back. INCORRECT SITTING POSTURES  If you are feeling tired and unable to assume a healthy sitting posture, do not slouch or slump. This puts excessive strain on your back tissues, causing more damage and pain. Healthier options include:  Using more support, like a lumbar pillow.  Switching tasks to something that requires you to be upright or walking.  Talking a brief walk.  Lying down to rest in a neutral-spine position. PROLONGED STANDING WHILE SLIGHTLY LEANING FORWARD  When completing a task that requires you to lean forward while standing in one place for a long time, place either foot up on  a stationary 2-4 inch high object to help maintain the best posture. When both feet are on the ground, the lower back tends to lose its slight inward curve. If this curve flattens (or becomes too large), then the back and your other joints will experience too much stress, tire more quickly, and can cause pain. CORRECT STANDING POSTURES Proper standing posture should be assumed with all daily activities, even if they only take a few moments, like when brushing your teeth. As in sitting, your ears should fall over your shoulders and your shoulders should fall over your hips. You should keep a slight tension in your abdominal muscles to brace your spine. Your tailbone should point down to the ground, not behind your body, resulting in an over-extended swayback posture.  INCORRECT STANDING POSTURES  Common incorrect standing postures include a forward head, locked knees and/or an excessive swayback. WALKING Walk with an upright posture. Your ears, shoulders and hips should all line-up. PROLONGED ACTIVITY IN A FLEXED POSITION When completing a task that requires you to bend forward at your waist or lean over a low surface, try to find a way to stabilize 3 out of 4 of your limbs. You can place a hand or elbow on your thigh or rest a knee on the surface you are reaching across. This will provide you more stability, so that your muscles do not tire as quickly. By keeping your knees relaxed, or slightly bent, you will also reduce stress across your lower back. CORRECT LIFTING TECHNIQUES DO :  Assume a wide stance. This will provide you more stability and the opportunity to get as close as possible to the object which you are lifting.  Tense your abdominals to brace your spine. Bend at the knees and hips. Keeping your back locked in a neutral-spine position, lift using your leg muscles. Lift with your legs, keeping your back straight.  Test the weight of  unknown objects before attempting to lift them.  Try  to keep your elbows locked down at your sides in order get the best strength from your shoulders when carrying an object.  Always ask for help when lifting heavy or awkward objects. INCORRECT LIFTING TECHNIQUES DO NOT:   Lock your knees when lifting, even if it is a small object.  Bend and twist. Pivot at your feet or move your feet when needing to change directions.  Assume that you can safely pick up even a paperclip without proper posture.   This information is not intended to replace advice given to you by your health care provider. Make sure you discuss any questions you have with your health care provider.   Document Released: 12/12/2005 Document Revised: 01/02/2015 Document Reviewed: 03/26/2009 [employer] Interactive Patient Education [employer].

## 2016-05-19 NOTE — Progress Notes (Signed)
Subjective:    Patient ID: Rickey Cruz, male    DOB: [birth year], 55 y.o.   MRN: [id number]  05/19/2016  Back Pain   HPI This 55 y.o. male presents for evaluation of lower back pain.  Onset one week ago with acute worsening yesterday. B lower back pain.  No radation into legs; no n/t/w.  Urinating well.  No saddle parestehias.  Normal b/b function. Took Ibuprofen with some improvement.    HTN: Patient reports good compliance with medication, good tolerance to medication, and good symptom control.    Glucose intolerance: due for labs; has been working on diet and exercise.  Hyperlipidemia: Patient reports good compliance with medication, good tolerance to medication, and good symptom control.    Review of Systems  Constitutional: Negative for fever, chills, diaphoresis, activity change, appetite change and fatigue.  Respiratory: Negative for cough and shortness of breath.   Cardiovascular: Negative for chest pain, palpitations and leg swelling.  Gastrointestinal: Negative for nausea, vomiting, abdominal pain and diarrhea.  Endocrine: Negative for cold intolerance, heat intolerance, polydipsia, polyphagia and polyuria.  Genitourinary: Negative for decreased urine volume and difficulty urinating.  Musculoskeletal: Positive for back pain.  Skin: Negative for color change, rash and wound.  Neurological: Negative for dizziness, tremors, seizures, syncope, facial asymmetry, speech difficulty, weakness, light-headedness, numbness and headaches.  Psychiatric/Behavioral: Negative for sleep disturbance and dysphoric mood. The patient is not nervous/anxious.     Past Medical History  Diagnosis Date  . Asthma     seasonal, with allergies  . Hyperlipidemia   . Hypertension   . Psoriasis     [name]  . History of kidney stones     x1  . Colon polyp 10/27/2011  . Allergy     Flonase, Patanol, Zyrtec  . Arthritis     psoriasis  . 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 Low back pain     "in kidney area-was told that it was related to gallstones"  . Depression   . GERD (gastroesophageal reflux disease)   . Borderline diabetes    Past Surgical History  Procedure Laterality Date  . Colonoscopy  10/27/2011    single polyp. Repeat 5 years.  . Polypectomy    . Septoplasty  2014  . Prostate biopsy  10/2014    will have another in 3 months  . Appendectomy    . Tonsillectomy  2014  . Uvulopalatopharyngoplasty  2014  . Cholecystectomy N/A 12/16/2014    Procedure: LAPAROSCOPIC CHOLECYSTECTOMY WITH INTRAOPERATIVE CHOLANGIOGRAM;  Surgeon: [name], MD;  Location: [facility];  Service: General;  Laterality: N/A;   No Known Allergies  Social History   Social History  . Marital Status: Divorced    Spouse Name: N/A  . Number of Children: 1  . Years of Education: N/A   Occupational History  . TEACHER     [employer]   Social History Main Topics  . Smoking status: Never Smoker   . Smokeless tobacco: Never Used  . Alcohol Use: No  . Drug Use: No  . Sexual Activity: Yes     Comment: bisexual   Other Topics Concern  . Not on file   Social History Narrative   Original from [place]    Marital status: separated x 1 year after 13 years of marriage; not dating.   Children:  1 child (15).   Lives: with roommate; sees son daily   Employment: Pharmacist, hospital; transition from  Administration to teaching again in 2015; moved from [place], [place] in 2015; [employer] Spanish teacher             Family History  Problem Relation Age of Onset  . Colon cancer Neg Hx   . Esophageal cancer Neg Hx   . Stomach cancer Neg Hx   . Prostate cancer Neg Hx   . Diabetes Neg Hx   . CAD Neg Hx        Objective:    BP 122/80 mmHg  Pulse 68  Temp(Src) 98.3 F (36.8 C) (Oral)  Resp 18  Ht 5' 5.5" (1.664 m)  Wt 188 lb (85.276 kg)  BMI 30.80 kg/m2  SpO2 97% Physical Exam  Constitutional: He is oriented to person, place, and  time. He appears well-developed and well-nourished. No distress.  HENT:  Head: Normocephalic and atraumatic.  Right Ear: External ear normal.  Left Ear: External ear normal.  Nose: Nose normal.  Mouth/Throat: Oropharynx is clear and moist.  Eyes: Conjunctivae and EOM are normal. Pupils are equal, round, and reactive to light.  Neck: Normal range of motion. Neck supple. Carotid bruit is not present. No thyromegaly present.  Cardiovascular: Normal rate, regular rhythm, normal heart sounds and intact distal pulses.  Exam reveals no gallop and no friction rub.   No murmur heard. Pulmonary/Chest: Effort normal and breath sounds normal. He has no wheezes. He has no rales.  Abdominal: Soft. Bowel sounds are normal. He exhibits no distension and no mass. There is no tenderness. There is no rebound and no guarding.  Musculoskeletal:       Lumbar back: He exhibits decreased range of motion, tenderness and pain. He exhibits no bony tenderness, no spasm and normal pulse.  Lymphadenopathy:    He has no cervical adenopathy.  Neurological: He is alert and oriented to person, place, and time. No cranial nerve deficit.  Skin: Skin is warm and dry. No rash noted. He is not diaphoretic.  Psychiatric: He has a normal mood and affect. His behavior is normal.  Nursing note and vitals reviewed.       Assessment & Plan:   1. Glucose intolerance (impaired glucose tolerance)   2. Hyperlipidemia   3. Asthma in adult, mild intermittent, uncomplicated   4. Bilateral low back pain without sciatica   5. Need for hepatitis C screening test   6. BPH (benign prostatic hyperplasia)   7. Need for prophylactic vaccination and inoculation against viral hepatitis     Orders Placed This Encounter  Procedures  . DG Lumbar Spine Complete    Standing Status: Future     Number of Occurrences: 1     Standing Expiration Date: 05/19/2017    Order Specific Question:  Reason for Exam (SYMPTOM  OR DIAGNOSIS REQUIRED)     Answer:  lower back pain B; no injury; no radiation    Order Specific Question:  Preferred imaging location?    Answer:  External  . Hepatitis A vaccine adult IM  . CBC with Differential/Platelet  . Comprehensive metabolic panel    Order Specific Question:  Has the patient fasted?    Answer:  Yes  . Lipid panel    Order Specific Question:  Has the patient fasted?    Answer:  Yes  . Hepatitis C antibody  . PSA  . Microalbumin, urine  . POCT glycosylated hemoglobin (Hb A1C)  . POCT urinalysis dipstick   Meds ordered this encounter  Medications  . methocarbamol (ROBAXIN) 500  MG tablet    Sig: Take 1-2 tablets (500-1,000 mg total) by mouth every 6 (six) hours as needed for muscle spasms.    Dispense:  60 tablet    Refill:  0    Return in about 6 months (around 11/19/2016), or if symptoms worsen or fail to improve, for recheck.    Gardner Servantes Elayne Guerin, M.D. [facility] [address] [place], [place]  [phone] phone [phone] fax

## 2016-05-20 LAB — PSA: PSA: 5.05 ng/mL — ABNORMAL HIGH (ref <=4.00)

## 2016-06-12 ENCOUNTER — Encounter: Payer: Self-pay | Admitting: Family Medicine

## 2016-07-03 ENCOUNTER — Other Ambulatory Visit: Payer: Self-pay | Admitting: Physician Assistant

## 2016-07-03 ENCOUNTER — Other Ambulatory Visit: Payer: Self-pay | Admitting: Family Medicine

## 2016-07-05 ENCOUNTER — Other Ambulatory Visit: Payer: Self-pay | Admitting: Family Medicine

## 2016-07-07 NOTE — Telephone Encounter (Signed)
Called pharm to see if pt needed the cream instead of the solution that he should have RFs of. Advised that pt has picked up both cream and sol in the last 7 days, so this was sent in error.

## 2016-07-22 ENCOUNTER — Other Ambulatory Visit: Payer: Self-pay | Admitting: Family Medicine

## 2016-09-12 ENCOUNTER — Encounter: Payer: Self-pay | Admitting: Gastroenterology

## 2016-09-21 ENCOUNTER — Encounter: Payer: Self-pay | Admitting: Family Medicine

## 2016-09-21 ENCOUNTER — Ambulatory Visit (INDEPENDENT_AMBULATORY_CARE_PROVIDER_SITE_OTHER): Payer: BC Managed Care – PPO | Admitting: Family Medicine

## 2016-09-21 VITALS — BP 106/66 | HR 66 | Temp 98.2°F | Resp 16 | Ht 65.5 in | Wt 178.4 lb

## 2016-09-21 DIAGNOSIS — R739 Hyperglycemia, unspecified: Secondary | ICD-10-CM | POA: Diagnosis not present

## 2016-09-21 DIAGNOSIS — R972 Elevated prostate specific antigen [PSA]: Secondary | ICD-10-CM | POA: Diagnosis not present

## 2016-09-21 DIAGNOSIS — B309 Viral conjunctivitis, unspecified: Secondary | ICD-10-CM | POA: Diagnosis not present

## 2016-09-21 LAB — COMPREHENSIVE METABOLIC PANEL
ALT: 26 U/L (ref 9–46)
AST: 22 U/L (ref 10–35)
Albumin: 4.3 g/dL (ref 3.6–5.1)
Alkaline Phosphatase: 45 U/L (ref 40–115)
BUN: 16 mg/dL (ref 7–25)
CHLORIDE: 102 mmol/L (ref 98–110)
CO2: 29 mmol/L (ref 20–31)
CREATININE: 1.04 mg/dL (ref 0.70–1.33)
Calcium: 9.4 mg/dL (ref 8.6–10.3)
Glucose, Bld: 104 mg/dL — ABNORMAL HIGH (ref 65–99)
POTASSIUM: 3.5 mmol/L (ref 3.5–5.3)
SODIUM: 140 mmol/L (ref 135–146)
TOTAL PROTEIN: 6.6 g/dL (ref 6.1–8.1)
Total Bilirubin: 1 mg/dL (ref 0.2–1.2)

## 2016-09-21 LAB — CBC
HEMATOCRIT: 49.1 % (ref 38.5–50.0)
HEMOGLOBIN: 16.9 g/dL (ref 13.2–17.1)
MCH: 30.2 pg (ref 27.0–33.0)
MCHC: 34.4 g/dL (ref 32.0–36.0)
MCV: 87.7 fL (ref 80.0–100.0)
MPV: 8.9 fL (ref 7.5–12.5)
Platelets: 250 10*3/uL (ref 140–400)
RBC: 5.6 MIL/uL (ref 4.20–5.80)
RDW: 13.5 % (ref 11.0–15.0)
WBC: 4.1 10*3/uL (ref 3.8–10.8)

## 2016-09-21 LAB — POCT GLYCOSYLATED HEMOGLOBIN (HGB A1C): Hemoglobin A1C: 5.7

## 2016-09-21 LAB — PSA: PSA: 4.4 ng/mL — AB (ref <=4.0)

## 2016-09-21 MED ORDER — CLOBETASOL PROPIONATE 0.05 % EX OINT: 1.0000 "application " | TOPICAL_OINTMENT | Freq: Two times a day (BID) | CUTANEOUS | 1 refills | Status: DC

## 2016-09-21 MED ORDER — AZELASTINE HCL 0.05 % OP SOLN: 1.0000 [drp] | Freq: Two times a day (BID) | OPHTHALMIC | 2 refills | Status: DC

## 2016-09-21 NOTE — Patient Instructions (Addendum)
Use the eye drops in both eyes twice a day.    Continue the Restasis.    We are checking labs today.  Including prostate.   Refills for clobetasol ointment.     IF you received an x-ray today, you will receive an invoice from [facility]. Please contact [facility] at [phone] with questions or concerns regarding your invoice.   IF you received labwork today, you will receive an invoice from [employer]. Please contact [facility] at [phone] with questions or concerns regarding your invoice.   Our billing staff will not be able to assist you with questions regarding bills from these companies.  You will be contacted with the lab results as soon as they are available. The fastest way to get your results is to activate your My Chart account. Instructions are located on the last page of this paperwork. If you have not heard from [place] regarding the results in 2 weeks, please contact this office.

## 2016-09-21 NOTE — Progress Notes (Signed)
Rickey Cruz is a 55 y.o. male who presents to [facility] today for eye redness and prior hyeroglycemic episode  1.  Red eye:  Started about 4 days ago. Patient works sitting in front of the computer and wonders if this is eyestrain. He has noticed redness on the lateral aspect of his right eye and some in his left eye as well. Some itching of his eyes. He states he does have allergic conjunctivitis during the spring but that this is different. Last night he had a gritty sensation in form body sensation in his right eye. He woke up this morning and it persisted. Does not have that sensation currently. He has been prescribed Restasis previously by his ophthalmologist and was not using this for the past several months but restarted this in the past several days. States this does help.  No changes to vision/blurry vision/etc.  #2. Hyperglycemia: Patient is every diagnosed with diabetes nor has he ever had any symptoms. However on lab testing he did have glucose greater than 131 fasting. A1c at that time which was about 8 months ago was 6.3. Follow-up lab testing was 5.7 only about 4 months ago. He has been trying to control his blood sugars with diet and exercise. He is not checking his blood sugars at home. No polyuria polydipsia.  ROS as above.    Does have history of elevated prostate. He has had negative biopsies done. Last PSA was elevated to greater than 5. Patient is requesting repeat today.  PMH reviewed. Patient is a nonsmoker.   Past Medical History:  Diagnosis Date  . Allergy    Flonase, Patanol, Zyrtec  . Arthritis    psoriasis  . Asthma    seasonal, with allergies  . Borderline diabetes   . Colon polyp 10/27/2011  . Depression   . GERD (gastroesophageal reflux disease)   . History of kidney stones    x1  . Hyperlipidemia   . Hypertension   . Low back pain    "in kidney area-was told that it was related to gallstones"  . Psoriasis    Hope  Gruber/dermatology  . 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 APPENDECTOMY    . CHOLECYSTECTOMY N/A 12/16/2014   Procedure: LAPAROSCOPIC CHOLECYSTECTOMY WITH INTRAOPERATIVE CHOLANGIOGRAM;  Surgeon: [name], MD;  Location: [facility];  Service: General;  Laterality: N/A;  . COLONOSCOPY  10/27/2011   single polyp. Repeat 5 years.  Marland Kitchen POLYPECTOMY    . PROSTATE BIOPSY  10/2014   will have another in 3 months  . SEPTOPLASTY  2014  . TONSILLECTOMY  2014  . UVULOPALATOPHARYNGOPLASTY  2014    Medications reviewed. Current Outpatient Prescriptions  Medication Sig Dispense Refill  . albuterol (PROAIR HFA) 108 (90 Base) MCG/ACT inhaler Inhale 2 puffs into the lungs every 4 (four) hours as needed for wheezing or shortness of breath. 1 Inhaler 1  . amLODipine (NORVASC) 5 MG tablet TAKE 1 TABLET(5 MG) BY MOUTH DAILY 90 tablet 0  . beclomethasone (QVAR) 80 MCG/ACT inhaler Inhale 2 puffs into the lungs 2 (two) times daily. 1 Inhaler 3  . clobetasol (OLUX) 0.05 % topical foam APPLY EXTERNALLY TO THE AFFECTED AREA TWICE DAILY 100 g 8  . clobetasol ointment (TEMOVATE) [id number] % Apply 1 application topically 2 (two) times daily. 30 g 1  . cycloSPORINE (RESTASIS) 0.05 % ophthalmic emulsion 1 drop 2 (two) times  daily. Reported on 02/03/2016    . desonide (DESOWEN) 0.05 % cream Apply topically 2 (two) times daily. 60 g 2  . Erythromycin 2 % ointment Apply 1 drop topically 2 (two) times daily. For 10 days 25 g 0  . fluocinolone (VANOS) 0.01 % cream APPLY EXTERNALLY TO THE AFFECTED AREA TWICE DAILY 30 g 5  . fluocinonide (LIDEX) 0.05 % external solution APPLY TOPICALLY DAILY AS NEEDED 60 mL 0  . Fluocinonide 0.1 % CREA APPLY TOPICALLY TO THE AFFECTED AREA DAILY AS NEEDED 60 g 3  . fluticasone (FLONASE) 50 MCG/ACT nasal spray USE 2 SPRAYS IN EACH NOSTRIL EVERY DAY 16 g 0  . hydrochlorothiazide (HYDRODIURIL) 12.5 MG tablet TAKE 1 TABLET BY  MOUTH DAILY 90 tablet 0  . ketoconazole (NIZORAL) 2 % shampoo Apply 1 application topically 2 (two) times a week. 120 mL 5  . montelukast (SINGULAIR) 10 MG tablet Take 1 tablet (10 mg total) by mouth at bedtime. 90 tablet 3  . rosuvastatin (CRESTOR) 10 MG tablet Take 1 tablet (10 mg total) by mouth daily. 90 tablet 2  . trimethoprim-polymyxin b (POLYTRIM) ophthalmic solution Place 1 drop into both eyes every 4 (four) hours. 10 mL 0  . azelastine (OPTIVAR) 0.05 % ophthalmic solution Place 1 drop into both eyes 2 (two) times daily. 6 mL 2  . budesonide (RHINOCORT AQUA) 32 MCG/ACT nasal spray Use 1-2 sprays in each nostril once daily for stuffy nose or drainage (Patient not taking: Reported on 09/21/2016) 1 Bottle 5  . fluocinonide (LIDEX) 0.05 % external solution APPLY TOPICALLY DAILY AS NEEDED (Patient not taking: Reported on 09/21/2016) 60 mL 10  . hydrochlorothiazide (HYDRODIURIL) 12.5 MG tablet TAKE 1 TABLET BY MOUTH EVERY DAY 90 tablet 0  . methocarbamol (ROBAXIN) 500 MG tablet Take 1-2 tablets (500-1,000 mg total) by mouth every 6 (six) hours as needed for muscle spasms. (Patient not taking: Reported on 09/21/2016) 60 tablet 0  . predniSONE (DELTASONE) 20 MG tablet Three tablets daily x 2 days then two tablets daily x 3 days then one tablet daily x 3 days (Patient not taking: Reported on 09/21/2016) 15 tablet 0   No current facility-administered medications for this visit.      Physical Exam:  BP 106/66 (BP Location: Right Arm, Patient Position: Sitting, Cuff Size: Normal)   Pulse 66   Temp 98.2 F (36.8 C) (Oral)   Resp 16   Ht 5' 5.5" (1.664 m)   Wt 178 lb 6.4 oz (80.9 kg)   SpO2 97%   BMI 29.24 kg/m  Gen:  Alert, cooperative patient who appears stated age in no acute distress.  Vital signs reviewed. Eyes:  EOMI, PERRL.  he does have scleral injection bilaterally but worse lateral aspect of his right eye. Fluorescein examination in the office revealed no corneal nor scleral abrasion. No  globus interruption. Ears:  External ears WNL, Bilateral TM's normal without retraction, redness or bulging. Mouth:  MMM, tonsils non-erythematous, non-edematous.   Pulm:  Clear to auscultation bilaterally with good air movement.  No wheezes or rales noted.   Cardiac:  Regular rate and rhythm without murmur auscultated. Abd:  Soft/nondistended/nontender.  Good bowel sounds throughout all four quadrants.  No masses noted.  Exts: Non edematous BL  LE, warm and well perfused.   Assessment and Plan:  1.  Red eye: -Likely viral conjunctivitis. -Possibility also worsening of allergic Is. -No red flags on examination or by history. He does not have evidence of corneal abrasion. -Have  sent in antihistamine ophthalmologic drops for him. -He has a follow-up appointment artery schedule within the next month his ophthalmologist. Hanley Seamen him the natural progression/prognosis for viral conjunctivitis. This is not better within a week he should return. If any worsening he should return more quickly.  #2.  History of hyperglycemia: -Rechecking labs today. -This includes A1c. -Also taking basic chemistry panel joints.  3. Elevated prostate: -Patient has follow-up appointment scheduled with his urologist in December. -He is requesting repeat PSA today to trend. -We'll obtain this today.

## 2016-09-27 ENCOUNTER — Other Ambulatory Visit: Payer: Self-pay | Admitting: Family Medicine

## 2016-09-27 ENCOUNTER — Other Ambulatory Visit: Payer: Self-pay | Admitting: Emergency Medicine

## 2016-09-27 DIAGNOSIS — R21 Rash and other nonspecific skin eruption: Secondary | ICD-10-CM

## 2016-10-19 ENCOUNTER — Telehealth: Payer: Self-pay | Admitting: Gastroenterology

## 2016-10-19 NOTE — Telephone Encounter (Signed)
Patient states that he was "rushed" to [place] earlier this week for abd pain. He says that [facility] told him that it was a 24 hr virus but he didn't have a fever, diarrhea, etc. He doesn't agree with [facility] diagnoses. He is scheduled for recall colon and is wanting to know if he can also have EGD as well. Patient states that he is still having pain.

## 2016-10-19 NOTE — Telephone Encounter (Signed)
The pt has an appt on 10/21/16 with pre visit and will discuss issues with the previsit nurse.  He wants to add EGD but does not want to have office visit first.  He will speak with the previsit nurse and decide if he wants to keep appt as scheduled

## 2016-10-21 ENCOUNTER — Telehealth: Payer: Self-pay

## 2016-10-21 ENCOUNTER — Ambulatory Visit (AMBULATORY_SURGERY_CENTER): Payer: Self-pay

## 2016-10-21 VITALS — Ht 65.0 in | Wt 182.4 lb

## 2016-10-21 DIAGNOSIS — Z860100 Personal history of colon polyps, unspecified: Secondary | ICD-10-CM

## 2016-10-21 MED ORDER — SUPREP BOWEL PREP KIT 17.5-3.13-1.6 GM/177ML PO SOLN: 1.0000 | Freq: Once | ORAL | 0 refills | Status: AC

## 2016-10-21 NOTE — Progress Notes (Signed)
No allergies to eggs or soy Conscious sedation issues.  Pt very upset .  Felt he was taken advantage of by carepartners who may have asked him private information following his last 2 colonoscopies.  Pt tearful.  Reassured about propofol and its short half-life.  Pt unconvinced.  Called in [place] to support information.  Pt was satisfied after talking with him No diet meds No home oxygen  Declined emmi

## 2016-10-21 NOTE — Telephone Encounter (Signed)
Dr Ardis Hughs, Pt had recent episode that was quite scary to pt.  See encounter at [facility].  He would like an EGD for GERD.  Could not find him an appt with you in the near future.  Would it be possible to add on an EGD without you seeing him in clinic first?                                                                               Thank you,                                                                                     Angela/PV

## 2016-10-24 NOTE — Telephone Encounter (Signed)
Pt scheduled for new pt appt with Ellouise Newer Tuesday 10/31 at 1:15.  Pt aware.

## 2016-10-24 NOTE — Telephone Encounter (Signed)
I have 9 cases that morning (completely full) and cannot add on an EGD.  Can you see about getting him in with extender, next available appt in the office instead?

## 2016-10-25 ENCOUNTER — Ambulatory Visit (INDEPENDENT_AMBULATORY_CARE_PROVIDER_SITE_OTHER): Payer: BC Managed Care – PPO | Admitting: Physician Assistant

## 2016-10-25 ENCOUNTER — Ambulatory Visit: Payer: BC Managed Care – PPO | Admitting: Physician Assistant

## 2016-10-25 ENCOUNTER — Encounter: Payer: Self-pay | Admitting: Physician Assistant

## 2016-10-25 VITALS — BP 122/70 | Ht 65.0 in | Wt 180.4 lb

## 2016-10-25 DIAGNOSIS — Z1212 Encounter for screening for malignant neoplasm of rectum: Secondary | ICD-10-CM

## 2016-10-25 DIAGNOSIS — R1013 Epigastric pain: Secondary | ICD-10-CM | POA: Diagnosis not present

## 2016-10-25 DIAGNOSIS — K5900 Constipation, unspecified: Secondary | ICD-10-CM

## 2016-10-25 DIAGNOSIS — R112 Nausea with vomiting, unspecified: Secondary | ICD-10-CM

## 2016-10-25 DIAGNOSIS — K219 Gastro-esophageal reflux disease without esophagitis: Secondary | ICD-10-CM | POA: Diagnosis not present

## 2016-10-25 DIAGNOSIS — Z1211 Encounter for screening for malignant neoplasm of colon: Secondary | ICD-10-CM

## 2016-10-25 MED ORDER — OMEPRAZOLE 40 MG PO CPDR: 40.0000 mg | DELAYED_RELEASE_CAPSULE | Freq: Every day | ORAL | 3 refills | Status: DC

## 2016-10-25 NOTE — Progress Notes (Signed)
I agree with the above note, plan 

## 2016-10-25 NOTE — Patient Instructions (Signed)

## 2016-10-25 NOTE — Progress Notes (Signed)
Chief Complaint: Epigastric pain, dyspepsia  HPI: [name] is a 55 year old Hispanic male with a past medical history significant for arthritis, asthma, colon polyps, depression, GERD, hyperlipidemia, hypertension and psoriasis, who presents to clinic today with a complaint of acute onset of epigastric pain on 10/18/16. Patient regularly follows with Dr. Ardis Hughs for his screening colonoscopies and in fact he is scheduled for one on November 17 due to his history of adenomatous polyps, last colonoscopy 10/31/11 with recommendations for repeat in 5 years.   Per chart review patient was recently seen in the ED at [facility] on 10/18/16 with complaint of epigastric pain radiating through to his back. At that time it was noted patient improved with a GI cocktail. After time of discharge patient developed nausea and vomiting which improved with Zofran. Patient did have a CT of the chest to rule out an aortic dissection which showed a patulous thoracic esophagus with abnormal course lateral to the thoracic aorta, mild coronary atherosclerotic disease and no evidence of intramural hematoma, dissection or aneurysm of the thoracic aorta.  Today, the patient tells me that he is already scheduled for his screening colonoscopy, but would like to know if we can add an EGD due to his recent episode of epigastric pain. The patient describes that "out of the blue", he started with an epigastric pain which radiated through to his back while sitting at his desk at his office in the morning around 9 AM. He hurt so badly that he laid down on his desk, the pain continued to increase and he rated as a 10/ 10. He then proceeded to the ER and had evaluation as above. He was discharged around 3:00 and tells me that while waiting for his car he had an episode of nausea and persistent vomiting. He stayed home from work the rest of the week and felt well on Wednesday, but Thursday he developed the epigastric pain again that was not as  severe. This is associated with burping, GERD and bloating. The patient tells me that currently he continues with frequent reflux and some nausea as well as in less frequent epigastric pain and bloating.  Patient also tells me he has a history of intermittent constipation, most recently over the past week he has been having daily bowel movements, but describes being constipated last week before time of his epigastric pain and had improvement after using 2 Dulcolax. He tells me he was given medicine including Zantac and Zofran while at the ED, but he never picked up these prescriptions because he knew he was coming to our office and "wanted to see what we would say".  Patient denies fever, chills, blood in his stool, melena, change in diet, recent weight loss, fatigue, anorexia, continued vomiting, dysphagia or symptoms that awaken him at night.   Past Medical History:  Diagnosis Date  . Allergy    Flonase, Patanol, Zyrtec  . Arthritis    psoriasis  . Asthma    seasonal, with allergies  . Borderline diabetes   . Colon polyp 10/27/2011  . Depression   . GERD (gastroesophageal reflux disease)   . History of kidney stones    x1  . Hyperlipidemia   . Hypertension   . Low back pain    "in kidney area-was told that it was related to gallstones"  . Psoriasis    [name]  . 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  APPENDECTOMY    . CHOLECYSTECTOMY N/A 12/16/2014   Procedure: LAPAROSCOPIC CHOLECYSTECTOMY WITH INTRAOPERATIVE CHOLANGIOGRAM;  Surgeon: [name], MD;  Location: [facility];  Service: General;  Laterality: N/A;  . COLONOSCOPY  10/27/2011   single polyp. Repeat 5 years.  Marland Kitchen POLYPECTOMY    . PROSTATE BIOPSY  10/2014   will have another in 3 months  . SEPTOPLASTY  2014  . TONSILLECTOMY  2014  . UVULOPALATOPHARYNGOPLASTY  2014    Current Outpatient Prescriptions  Medication Sig Dispense  Refill  . albuterol (PROAIR HFA) 108 (90 Base) MCG/ACT inhaler Inhale 2 puffs into the lungs every 4 (four) hours as needed for wheezing or shortness of breath. 1 Inhaler 1  . amLODipine (NORVASC) 5 MG tablet TAKE 1 TABLET(5 MG) BY MOUTH DAILY 90 tablet 0  . azelastine (OPTIVAR) 0.05 % ophthalmic solution Place 1 drop into both eyes 2 (two) times daily. 6 mL 2  . beclomethasone (QVAR) 80 MCG/ACT inhaler Inhale 2 puffs into the lungs 2 (two) times daily. 1 Inhaler 3  . clobetasol (OLUX) 0.05 % topical foam Apply topically 2 (two) times daily.    . clobetasol ointment (TEMOVATE) [id number] % Apply 1 application topically 2 (two) times daily.    . cycloSPORINE (RESTASIS) 0.05 % ophthalmic emulsion 1 drop 2 (two) times daily. Reported on 02/03/2016    . fluocinolone (VANOS) 0.01 % cream Apply topically 2 (two) times daily.    . Fluocinonide 0.1 % CREA APPLY EXTERNALLY TO THE AFFECTED AREA DAILY AS NEEDED 60 g 1  . fluticasone (FLONASE) 50 MCG/ACT nasal spray USE 2 SPRAYS IN EACH NOSTRIL EVERY DAY 16 g 0  . hydrochlorothiazide (HYDRODIURIL) 12.5 MG tablet TAKE 1 TABLET BY MOUTH EVERY DAY 90 tablet 0  . ketoconazole (NIZORAL) 2 % shampoo Apply 1 application topically 2 (two) times a week. 120 mL 5  . montelukast (SINGULAIR) 10 MG tablet Take 1 tablet (10 mg total) by mouth at bedtime. 90 tablet 3  . rosuvastatin (CRESTOR) 10 MG tablet TAKE 1 TABLET(10 MG) BY MOUTH DAILY 90 tablet 0  . trimethoprim-polymyxin b (POLYTRIM) ophthalmic solution Place 1 drop into both eyes every 4 (four) hours. 10 mL 0  . omeprazole (PRILOSEC) 40 MG capsule Take 1 capsule (40 mg total) by mouth daily. 30-60 mins before eating. 90 capsule 3   No current facility-administered medications for this visit.     Allergies as of 10/25/2016  . (No Known Allergies)    Family History  Problem Relation Age of Onset  . Colon cancer Neg Hx   . Esophageal cancer Neg Hx   . Stomach cancer Neg Hx   . Prostate cancer Neg Hx   . Diabetes  Neg Hx   . CAD Neg Hx     Social History   Social History  . Marital status: Divorced    Spouse name: N/A  . Number of children: 1  . Years of education: N/A   Occupational History  . TEACHER [employer]   Social History Main Topics  . Smoking status: Never Smoker  . Smokeless tobacco: Never Used  . Alcohol use No  . Drug use: No  . Sexual activity: Yes     Comment: bisexual   Other Topics Concern  . Not on file   Social History Narrative   Original from [place]    Marital status: separated x 1 year after 13 years of marriage; not dating.   Children:  1  child (70).   Lives: with roommate; sees son daily   Employment: Pharmacist, hospital; transition from Administration to teaching again in 2015; moved from [place], [place] in 2015; [employer] teacher              Review of Systems:     Constitutional: No weight loss, fever, chills, weakness or fatigue HEENT: Eyes: No change in vision               Ears, Nose, Throat:  No change in hearing or congestion Skin: No rash or itching Cardiovascular: No chest pain, chest pressure or palpitations   Respiratory: No SOB or cough Gastrointestinal: See HPI and otherwise negative Genitourinary: No dysuria or change in urinary frequency Neurological: No headache, dizziness or syncope Musculoskeletal: No new muscle or joint pain Hematologic: No bleeding or bruising Psychiatric: Positive for history of depression No history of anxiety    Physical Exam:  Vital signs: BP 122/70   Ht 5\' 5"  (1.651 m)   Wt 180 lb 6 oz (81.8 kg)   BMI 30.02 kg/m   General:   Pleasant Hispanic male appears to be in NAD, Well developed, Well nourished, alert and cooperative Head:  Normocephalic and atraumatic. Eyes:   PEERL, EOMI. No icterus. Conjunctiva pink. Ears:  Normal auditory acuity. Neck:  Supple Throat: Oral cavity and pharynx without inflammation, swelling or lesion.  Lungs: Respirations even and  unlabored. Lungs clear to auscultation bilaterally.   No wheezes, crackles, or rhonchi.  Heart: Normal S1, S2. No MRG. Regular rate and rhythm. No peripheral edema, cyanosis or pallor.  Abdomen:  Soft, nondistended, moderate epigastric tenderness with some involuntary guarding  Normal bowel sounds. No appreciable masses or hepatomegaly. Rectal:  Not performed.  Msk:  Symmetrical without gross deformities. Extremities:  Without edema, no deformity or joint abnormality. Normal ROM. Neurologic:  Alert and  oriented x4;  grossly normal neurologically.  Skin:   Dry and intact without significant lesions or rashes. Psychiatric: Oriented to person, place and time. Demonstrates good judgement and reason without abnormal affect or behaviors.  RELEVANT LABS AND IMAGING: CMP 10/18/16: Potassium decreased at 3.4, AST increased at 66, ALT increased at 64, otherwise normal. CBC 10/18/16: Hemoglobin height 17.8, red blood cell count 5.76, otherwise normal INR 10/18/16: 1.0  CT CHEST DISSECTION ANGIOGRAM PROTOCOL INCL CTA CHEST W WO 10/18/16  Indication: chest pain, back pain, R07.89 Other chest pain.   Comparison: None  Technique:Dissection protocol with pre and post contrast images.  Portions of this study were obtained following intravenous administration of 65 mL IsoVue 370 iodinated contrast material.If IV contrast material had not been administered, the likelihood of detecting abnormalities relevant to the patient's condition would have been substantially decreased.3-D reconstructions, including maximal intensity projection (MIP) images, were likewise performed and indicated to increase the sensitivity of detecting clinically relevant pathology.  Findings: No thoracic aortic dissection, aneurysm, intramural hematoma, or central pulmonary embolism. The ascending and descending thoracic aortas are normal in size. Normal three-vessel arch. The visualized portions of the celiac and superior  mesenteric arteries are patent. Trace calcified atherosclerotic disease of the aortic arch. Otherwise no significant atherosclerotic disease of the aorta is seen.  The heart and pulmonary arteries are of normal size and configuration. There are mild coronary artery atherosclerotic calcifications.No pericardial effusion.  The central airways are patent.The lungs are clear.No pleural effusion.  Neck base is normal. No mediastinal, hilar, or axillary lymphadenopathy.  Patulous thoracic esophagus with somewhat abnormal course lateral to the thoracic  aorta.  Imaged portions of the upper abdomen are unremarkable. No suspicious lytic or sclerotic osseous lesions.   Impression:  1. No evidence of intramural hematoma, dissection, or aneurysm of the thoracic aorta.  2. Mild coronary atherosclerotic disease.  3. Patulous thoracic esophagus with abnormal course lateral to the thoracic aorta; it is possible this could be the source of dysphagia and chest pain.  Electronically Reviewed [id number] Danelle Earthly, MD Electronically Reviewed on:10/18/2016 4:28 PM  Electronically Reviewed [id number] Danelle Earthly, MD Electronically Reviewed on:10/19/2016 10:20 AM  I have reviewed the images and concur with the above findings.  Electronically Signed [id number] Truddie Coco, MD Electronically Signed on:10/19/2016 10:22 AM  Assessment: 1. GERD: Patient with daily symptoms of reflux, not currently on antireflux medication; consider gastritis versus esophagitis versus H. pylori versus other 2. Epigastric pain: Likely related to above, question PUD 3. Nausea and vomiting: Likely related to above, continues with some nausea but no further vomiting 4. Constipation: Recommend high fiber diet and possible MiraLAX if necessary 5. Screening colorectal cancer: Patient with history of tubular adenoma on last colonoscopy 2012, recommendations for repeat in 5 years, patient is due now  Plan: 1. Discussed above  with Dr. Ardis Hughs, he recommended proceeding with EGD at this time due to acute symptoms. This was scheduled in place of patient's screening colonoscopy on November 17. He was also scheduled for screening colonoscopy in the future. 2. Prescribed Omeprazole 40 mg daily, 30-60 minutes before eating. #30, refill 2 3. Reviewed antireflux diet and lifestyle modifications. Patient verbalized understanding. 4. Discussed the patient increase fiber in his diet and increase water intake to at least 6-8 8 ounce glasses per day as well as increase exercise to avoid intermittent constipation. He prefers to avoid laxatives at this time. Did discuss that he may use MiraLAX in the future if necessary. 5. Patient to follow in clinic per Dr. Ardis Hughs recommendations in the future.  Ellouise Newer, PA-C [facility] 10/25/2016, 11:55 AM  Cc: [name], MD

## 2016-10-25 NOTE — Progress Notes (Signed)
 ??????????????????? ?????????????????  ???????????????? ?????????? ??????????????? ???????????????????????????? ????????????????? ????????????????????? ??????????????????????????????????????? ???????????????????? ?????????????? ????????????????? ??????????????? ?????????????? ???????????????????? ????????????????????????? ?????????? ???????????????? ========================  ????????   10 ?????????????????????????????????? ??????????? ??????????????????????????????????? 

## 2016-10-30 ENCOUNTER — Other Ambulatory Visit: Payer: Self-pay | Admitting: Family Medicine

## 2016-11-01 ENCOUNTER — Encounter: Payer: Self-pay | Admitting: Gastroenterology

## 2016-11-11 ENCOUNTER — Ambulatory Visit (AMBULATORY_SURGERY_CENTER): Payer: BC Managed Care – PPO | Admitting: Gastroenterology

## 2016-11-11 ENCOUNTER — Encounter: Payer: Self-pay | Admitting: Gastroenterology

## 2016-11-11 VITALS — BP 126/88 | HR 58 | Temp 98.4°F | Resp 20 | Ht 65.0 in | Wt 180.0 lb

## 2016-11-11 DIAGNOSIS — K2990 Gastroduodenitis, unspecified, without bleeding: Secondary | ICD-10-CM

## 2016-11-11 DIAGNOSIS — K2970 Gastritis, unspecified, without bleeding: Secondary | ICD-10-CM

## 2016-11-11 DIAGNOSIS — R1013 Epigastric pain: Secondary | ICD-10-CM | POA: Diagnosis not present

## 2016-11-11 DIAGNOSIS — K2950 Unspecified chronic gastritis without bleeding: Secondary | ICD-10-CM

## 2016-11-11 MED ORDER — SODIUM CHLORIDE 0.9 % IV SOLN: 500.0000 mL | INTRAVENOUS | Status: DC

## 2016-11-11 NOTE — Progress Notes (Signed)
To recovery, report to Hodges, RN, VSS 

## 2016-11-11 NOTE — Progress Notes (Signed)
Called to room to assist during endoscopic procedure.  Patient ID and intended procedure confirmed with present staff. Received instructions for my participation in the procedure from the performing physician.  

## 2016-11-11 NOTE — Op Note (Signed)
[facility] Patient Name: Rickey Cruz Procedure Date: 11/11/2016 10:55 AM MRN: [id number] Endoscopist: Milus Banister , MD Age: 55 Referring MD:  Date of Birth: 1961/11/18 Gender: Male Account #: [id number] Procedure:                Upper GI endoscopy Indications:              Epigastric abdominal pain Medicines:                Monitored Anesthesia Care Procedure:                Pre-Anesthesia Assessment:                           - Prior to the procedure, a History and Physical                            was performed, and patient medications and                            allergies were reviewed. The patient's tolerance of                            previous anesthesia was also reviewed. The risks                            and benefits of the procedure and the sedation                            options and risks were discussed with the patient.                            All questions were answered, and informed consent                            was obtained. Prior Anticoagulants: The patient has                            taken no previous anticoagulant or antiplatelet                            agents. ASA Grade Assessment: II - A patient with                            mild systemic disease. After reviewing the risks                            and benefits, the patient was deemed in                            satisfactory condition to undergo the procedure.                           After obtaining informed consent, the endoscope was  passed under direct vision. Throughout the                            procedure, the patient's blood pressure, pulse, and                            oxygen saturations were monitored continuously. The                            Model GIF-[id number] scope was introduced                            through the mouth, and advanced to the second part                            of duodenum. The upper GI  endoscopy was                            accomplished without difficulty. The patient                            tolerated the procedure well. Scope In: Scope Out: Findings:                 The esophagus was normal.                           Diffuse mild inflammation characterized by erythema                            and friability was found in the entire examined                            stomach. Biopsies were taken with a cold forceps                            for histology.                           The examined duodenum was normal. Complications:            No immediate complications. Estimated blood loss:                            None. Estimated Blood Loss:     Estimated blood loss: none. Impression:               - Normal esophagus.                           - Gastritis. Biopsied.                           - Normal examined duodenum. Recommendation:           - Patient has a contact number available for  emergencies. The signs and symptoms of potential                            delayed complications were discussed with the                            patient. Return to normal activities tomorrow.                            Written discharge instructions were provided to the                            patient.                           - Resume previous diet.                           - Continue present medications.                           - Await pathology results. Milus Banister, MD 11/11/2016 11:06:54 AM This report has been signed electronically.

## 2016-11-11 NOTE — Patient Instructions (Signed)
YOU HAD AN ENDOSCOPIC PROCEDURE TODAY AT [facility]:   Refer to the procedure report that was given to you for any specific questions about what was found during the examination.  If the procedure report does not answer your questions, please call your gastroenterologist to clarify.  If you requested that your care partner not be given the details of your procedure findings, then the procedure report has been included in a sealed envelope for you to review at your convenience later.  YOU SHOULD EXPECT: Some feelings of bloating in the abdomen. Passage of more gas than usual.  Walking can help get rid of the air that was put into your GI tract during the procedure and reduce the bloating.   Please Note:  You might notice some irritation and congestion in your nose or some drainage.  This is from the oxygen used during your procedure.  There is no need for concern and it should clear up in a day or so.  SYMPTOMS TO REPORT IMMEDIATELY:    Following upper endoscopy (EGD)  Vomiting of blood or coffee ground material  New chest pain or pain under the shoulder blades  Painful or persistently difficult swallowing  New shortness of breath  Fever of 100F or higher  Black, tarry-looking stools  For urgent or emergent issues, a gastroenterologist can be reached at any hour by calling [phone].   DIET:  We do recommend a small meal at first, but then you may proceed to your regular diet.  Drink plenty of fluids but you should avoid alcoholic beverages for 24 hours.  ACTIVITY:  You should plan to take it easy for the rest of today and you should NOT DRIVE or use heavy machinery until tomorrow (because of the sedation medicines used during the test).    FOLLOW UP: Our staff will call the number listed on your records the next business day following your procedure to check on you and address any questions or concerns that you may have regarding the information given to you  following your procedure. If we do not reach you, we will leave a message.  However, if you are feeling well and you are not experiencing any problems, there is no need to return our call.  We will assume that you have returned to your regular daily activities without incident.  If any biopsies were taken you will be contacted by phone or by letter within the next 1-3 weeks.  Please call us at [phone] if you have not heard about the biopsies in 3 weeks.  I you do have the H-pylori infection, we will call you.  You will have to be on an antibiotic.  SIGNATURES/CONFIDENTIALITY: You and/or your care partner have signed paperwork which will be entered into your electronic medical record.  These signatures attest to the fact that that the information above on your After Visit Summary has been reviewed and is understood.  Full responsibility of the confidentiality of this discharge information lies with you and/or your care-partner.  Read all handouts given to you by your recovery room nurse.  Thank-you for choosing [facility] for your healthcare needs today.

## 2016-11-14 ENCOUNTER — Telehealth: Payer: Self-pay

## 2016-11-14 NOTE — Telephone Encounter (Signed)
  Follow up Call-  Call back number 11/11/2016  Post procedure Call Back phone  # [phone]  Permission to leave phone message Yes  Some recent data might be hidden     Patient questions:  Do you have a fever, pain , or abdominal swelling? No. Pain Score  0 *  Have you tolerated food without any problems? Yes.    Have you been able to return to your normal activities? Yes.    Do you have any questions about your discharge instructions: Diet   No. Medications  No. Follow up visit  No.  Do you have questions or concerns about your Care? No.  Actions: * If pain score is 4 or above: No action needed, pain <4.

## 2016-11-14 NOTE — Telephone Encounter (Signed)
  Follow up Call-  Call back number 11/11/2016  Post procedure Call Back phone  # [phone]  Permission to leave phone message Yes  Some recent data might be hidden    Patient was called for follow up after his procedure on 11/11/2016. No answer at the number given for follow up phone call. A message was left on the answering machine.

## 2016-11-16 ENCOUNTER — Encounter: Payer: Self-pay | Admitting: Gastroenterology

## 2016-11-25 ENCOUNTER — Other Ambulatory Visit: Payer: Self-pay | Admitting: Family Medicine

## 2016-11-25 DIAGNOSIS — J309 Allergic rhinitis, unspecified: Secondary | ICD-10-CM

## 2016-11-27 NOTE — Telephone Encounter (Signed)
08/2016 last ov 

## 2016-11-28 ENCOUNTER — Encounter: Payer: Self-pay | Admitting: Gastroenterology

## 2016-12-03 ENCOUNTER — Other Ambulatory Visit: Payer: Self-pay | Admitting: Physician Assistant

## 2016-12-03 DIAGNOSIS — R21 Rash and other nonspecific skin eruption: Secondary | ICD-10-CM

## 2016-12-05 ENCOUNTER — Ambulatory Visit (AMBULATORY_SURGERY_CENTER): Payer: BC Managed Care – PPO | Admitting: Gastroenterology

## 2016-12-05 ENCOUNTER — Encounter: Payer: Self-pay | Admitting: Gastroenterology

## 2016-12-05 VITALS — BP 120/83 | HR 64 | Temp 98.9°F | Resp 17 | Ht 65.0 in | Wt 180.0 lb

## 2016-12-05 DIAGNOSIS — Z86010 Personal history of colonic polyps: Secondary | ICD-10-CM

## 2016-12-05 DIAGNOSIS — Z1211 Encounter for screening for malignant neoplasm of colon: Secondary | ICD-10-CM

## 2016-12-05 MED ORDER — SODIUM CHLORIDE 0.9 % IV SOLN: 500.0000 mL | INTRAVENOUS | Status: DC

## 2016-12-05 NOTE — Op Note (Signed)
[place] Patient Name: Rickey Cruz Procedure Date: 12/05/2016 9:49 AM MRN: [id number] Endoscopist: Milus Banister , MD Age: 55 Referring MD:  Date of Birth: [birth year] Gender: Male Account #: [id number] Procedure:                Colonoscopy Indications:              High risk colon cancer surveillance: Personal                            history of colonic polyps (single subCM adenoma in                            2004, 2007, 2012) Medicines:                Monitored Anesthesia Care Procedure:                Pre-Anesthesia Assessment:                           - Prior to the procedure, a History and Physical                            was performed, and patient medications and                            allergies were reviewed. The patient's tolerance of                            previous anesthesia was also reviewed. The risks                            and benefits of the procedure and the sedation                            options and risks were discussed with the patient.                            All questions were answered, and informed consent                            was obtained. Prior Anticoagulants: The patient has                            taken no previous anticoagulant or antiplatelet                            agents. ASA Grade Assessment: II - A patient with                            mild systemic disease. After reviewing the risks                            and benefits, the patient was deemed in  satisfactory condition to undergo the procedure.                           After obtaining informed consent, the colonoscope                            was passed under direct vision. Throughout the                            procedure, the patient's blood pressure, pulse, and                            oxygen saturations were monitored continuously. The                            Model CF-HQ190L [id number]) scope was  introduced                            through the anus and advanced to the the cecum,                            identified by appendiceal orifice and ileocecal                            valve. The colonoscopy was performed without                            difficulty. The patient tolerated the procedure                            well. The quality of the bowel preparation was                            excellent. The ileocecal valve, appendiceal                            orifice, and rectum were photographed. Scope In: 9:54:29 AM Scope Out: 10:03:39 AM Scope Withdrawal Time: 0 hours 7 minutes 43 seconds  Total Procedure Duration: 0 hours 9 minutes 10 seconds  Findings:                 The entire examined colon appeared normal on direct                            and retroflexion views. Complications:            No immediate complications. Estimated blood loss:                            None. Estimated Blood Loss:     Estimated blood loss: none. Impression:               - The entire examined colon is normal on direct and                            retroflexion  views.                           - No polyps or cancers Recommendation:           - Patient has a contact number available for                            emergencies. The signs and symptoms of potential                            delayed complications were discussed with the                            patient. Return to normal activities tomorrow.                            Written discharge instructions were provided to the                            patient.                           - Resume previous diet.                           - Continue present medications.                           - Repeat colonoscopy in 10 years for screening                            purposes. Milus Banister, MD 12/05/2016 10:09:17 AM This report has been signed electronically.

## 2016-12-05 NOTE — Patient Instructions (Signed)
Discharge instructions given. Normal exam. Resume previous medications. YOU HAD AN ENDOSCOPIC PROCEDURE TODAY AT THE  ENDOSCOPY CENTER:   Refer to the procedure report that was given to you for any specific questions about what was found during the examination.  If the procedure report does not answer your questions, please call your gastroenterologist to clarify.  If you requested that your care partner not be given the details of your procedure findings, then the procedure report has been included in a sealed envelope for you to review at your convenience later.  YOU SHOULD EXPECT: Some feelings of bloating in the abdomen. Passage of more gas than usual.  Walking can help get rid of the air that was put into your GI tract during the procedure and reduce the bloating. If you had a lower endoscopy (such as a colonoscopy or flexible sigmoidoscopy) you may notice spotting of blood in your stool or on the toilet paper. If you underwent a bowel prep for your procedure, you may not have a normal bowel movement for a few days.  Please Note:  You might notice some irritation and congestion in your nose or some drainage.  This is from the oxygen used during your procedure.  There is no need for concern and it should clear up in a day or so.  SYMPTOMS TO REPORT IMMEDIATELY:   Following lower endoscopy (colonoscopy or flexible sigmoidoscopy):  Excessive amounts of blood in the stool  Significant tenderness or worsening of abdominal pains  Swelling of the abdomen that is new, acute  Fever of 100F or higher   For urgent or emergent issues, a gastroenterologist can be reached at any hour by calling [phone].   DIET:  We do recommend a small meal at first, but then you may proceed to your regular diet.  Drink plenty of fluids but you should avoid alcoholic beverages for 24 hours.  ACTIVITY:  You should plan to take it easy for the rest of today and you should NOT DRIVE or use heavy machinery  until tomorrow (because of the sedation medicines used during the test).    FOLLOW UP: Our staff will call the number listed on your records the next business day following your procedure to check on you and address any questions or concerns that you may have regarding the information given to you following your procedure. If we do not reach you, we will leave a message.  However, if you are feeling well and you are not experiencing any problems, there is no need to return our call.  We will assume that you have returned to your regular daily activities without incident.  If any biopsies were taken you will be contacted by phone or by letter within the next 1-3 weeks.  Please call us at [phone] if you have not heard about the biopsies in 3 weeks.    SIGNATURES/CONFIDENTIALITY: You and/or your care partner have signed paperwork which will be entered into your electronic medical record.  These signatures attest to the fact that that the information above on your After Visit Summary has been reviewed and is understood.  Full responsibility of the confidentiality of this discharge information lies with you and/or your care-partner. 

## 2016-12-05 NOTE — Progress Notes (Signed)
To rewcovery, report to [employer], RN, VSS.

## 2016-12-06 ENCOUNTER — Telehealth: Payer: Self-pay

## 2016-12-06 NOTE — Telephone Encounter (Signed)
Number identifier, left message, follow-up  

## 2016-12-06 NOTE — Telephone Encounter (Signed)
  Follow up Call-  Call back number 12/05/2016 11/11/2016  Post procedure Call Back phone  # [phone]  Permission to leave phone message Yes Yes  Some recent data might be hidden    Patient was called for follow up after his procedure on 12/05/2016. No answer at the number given for follow up phone call. A message was left on the answering machine.

## 2016-12-07 ENCOUNTER — Other Ambulatory Visit: Payer: Self-pay

## 2016-12-07 ENCOUNTER — Telehealth: Payer: Self-pay

## 2016-12-07 MED ORDER — CLOBETASOL PROPIONATE 0.05 % EX OINT: 1.0000 "application " | TOPICAL_OINTMENT | Freq: Two times a day (BID) | CUTANEOUS | 1 refills | Status: DC

## 2016-12-07 NOTE — Telephone Encounter (Signed)
  Follow up Call-  Call back number 12/05/2016 11/11/2016  Post procedure Call Back phone  # [phone]  Permission to leave phone message Yes Yes  Some recent data might be hidden    Patient was called for follow up after his procedure on 12/05/2016. No answer at the number given for follow up phone call. A message was left on the answering machine.

## 2017-01-25 ENCOUNTER — Other Ambulatory Visit: Payer: Self-pay | Admitting: Family Medicine

## 2017-01-25 DIAGNOSIS — R21 Rash and other nonspecific skin eruption: Secondary | ICD-10-CM

## 2017-02-01 ENCOUNTER — Other Ambulatory Visit: Payer: Self-pay | Admitting: *Deleted

## 2017-02-01 MED ORDER — ALBUTEROL SULFATE HFA 108 (90 BASE) MCG/ACT IN AERS: 2.0000 | INHALATION_SPRAY | RESPIRATORY_TRACT | 0 refills | Status: DC | PRN

## 2017-02-27 ENCOUNTER — Other Ambulatory Visit: Payer: Self-pay | Admitting: Physician Assistant

## 2017-02-27 DIAGNOSIS — R21 Rash and other nonspecific skin eruption: Secondary | ICD-10-CM

## 2017-02-28 NOTE — Telephone Encounter (Signed)
Meds ordered this encounter  Medications  . Fluocinonide 0.1 % CREA    Sig: APPLY EXTERNALLY TO THE AFFECTED AREA DAILY AS NEEDED    Dispense:  60 g    Refill:  0    Please advise patient that he needs a visit for additional refills.

## 2017-03-05 ENCOUNTER — Other Ambulatory Visit: Payer: Self-pay | Admitting: Allergy & Immunology

## 2017-03-05 ENCOUNTER — Other Ambulatory Visit: Payer: Self-pay | Admitting: Family Medicine

## 2017-03-05 ENCOUNTER — Other Ambulatory Visit: Payer: Self-pay | Admitting: Physician Assistant

## 2017-03-05 DIAGNOSIS — H00016 Hordeolum externum left eye, unspecified eyelid: Secondary | ICD-10-CM

## 2017-03-06 NOTE — Telephone Encounter (Signed)
Denied refill for Proair. Patient was last seen 02/03/2016.Patient was given a refill in Feb. On the note it said patient  needs office visit for further refills.

## 2017-03-20 ENCOUNTER — Other Ambulatory Visit: Payer: Self-pay | Admitting: Oculoplastics Ophthalmology

## 2017-03-20 DIAGNOSIS — H05811 Cyst of right orbit: Secondary | ICD-10-CM

## 2017-03-21 ENCOUNTER — Ambulatory Visit
Admission: RE | Admit: 2017-03-21 | Discharge: 2017-03-21 | Disposition: A | Discharge status: Home / Self Care | Payer: BC Managed Care – PPO | Source: Ambulatory Visit | Attending: Oculoplastics Ophthalmology | Admitting: Oculoplastics Ophthalmology

## 2017-03-21 DIAGNOSIS — H05811 Cyst of right orbit: Secondary | ICD-10-CM

## 2017-03-21 MED ORDER — IOPAMIDOL (ISOVUE-300) INJECTION 61%
75.0000 mL | Freq: Once | INTRAVENOUS | Status: AC | PRN
  Administered 2017-03-21: 75 mL via INTRAVENOUS

## 2017-03-23 ENCOUNTER — Other Ambulatory Visit: Payer: BC Managed Care – PPO

## 2017-03-26 ENCOUNTER — Other Ambulatory Visit: Payer: Self-pay | Admitting: Family Medicine

## 2017-04-05 ENCOUNTER — Other Ambulatory Visit: Payer: Self-pay | Admitting: Family Medicine

## 2017-05-03 ENCOUNTER — Ambulatory Visit (INDEPENDENT_AMBULATORY_CARE_PROVIDER_SITE_OTHER): Payer: BC Managed Care – PPO | Admitting: Family Medicine

## 2017-05-03 VITALS — BP 136/87 | HR 60 | Temp 98.0°F | Resp 16 | Ht 65.0 in | Wt 185.6 lb

## 2017-05-03 DIAGNOSIS — L409 Psoriasis, unspecified: Secondary | ICD-10-CM | POA: Diagnosis not present

## 2017-05-03 DIAGNOSIS — G4733 Obstructive sleep apnea (adult) (pediatric): Secondary | ICD-10-CM

## 2017-05-03 DIAGNOSIS — R918 Other nonspecific abnormal finding of lung field: Secondary | ICD-10-CM | POA: Diagnosis not present

## 2017-05-03 DIAGNOSIS — J301 Allergic rhinitis due to pollen: Secondary | ICD-10-CM

## 2017-05-03 DIAGNOSIS — R972 Elevated prostate specific antigen [PSA]: Secondary | ICD-10-CM

## 2017-05-03 DIAGNOSIS — Z8611 Personal history of tuberculosis: Secondary | ICD-10-CM | POA: Diagnosis not present

## 2017-05-03 DIAGNOSIS — H00014 Hordeolum externum left upper eyelid: Secondary | ICD-10-CM | POA: Diagnosis not present

## 2017-05-03 DIAGNOSIS — I10 Essential (primary) hypertension: Secondary | ICD-10-CM

## 2017-05-03 DIAGNOSIS — R7302 Impaired glucose tolerance (oral): Secondary | ICD-10-CM

## 2017-05-03 DIAGNOSIS — E7800 Pure hypercholesterolemia, unspecified: Secondary | ICD-10-CM

## 2017-05-03 DIAGNOSIS — J4540 Moderate persistent asthma, uncomplicated: Secondary | ICD-10-CM | POA: Diagnosis not present

## 2017-05-03 MED ORDER — ROSUVASTATIN CALCIUM 10 MG PO TABS: 10.0000 mg | ORAL_TABLET | Freq: Every day | ORAL | 1 refills | Status: DC

## 2017-05-03 MED ORDER — HYDROCHLOROTHIAZIDE 12.5 MG PO TABS: 12.5000 mg | ORAL_TABLET | Freq: Every day | ORAL | 1 refills | Status: DC

## 2017-05-03 MED ORDER — AMLODIPINE BESYLATE 5 MG PO TABS: 5.0000 mg | ORAL_TABLET | Freq: Every day | ORAL | 1 refills | Status: DC

## 2017-05-03 MED ORDER — KETOCONAZOLE 2 % EX SHAM: MEDICATED_SHAMPOO | CUTANEOUS | 3 refills | Status: DC

## 2017-05-03 MED ORDER — MONTELUKAST SODIUM 10 MG PO TABS: ORAL_TABLET | ORAL | 1 refills | Status: DC

## 2017-05-03 MED ORDER — CLOBETASOL PROPIONATE 0.05 % EX OINT: 1.0000 "application " | TOPICAL_OINTMENT | Freq: Two times a day (BID) | CUTANEOUS | 1 refills | Status: DC

## 2017-05-03 MED ORDER — BECLOMETHASONE DIPROPIONATE 80 MCG/ACT IN AERS: 2.0000 | INHALATION_SPRAY | Freq: Two times a day (BID) | RESPIRATORY_TRACT | 3 refills | Status: DC

## 2017-05-03 MED ORDER — FLUTICASONE PROPIONATE 50 MCG/ACT NA SUSP: NASAL | 0 refills | Status: DC

## 2017-05-03 NOTE — Progress Notes (Signed)
Subjective:    Patient ID: Rickey Cruz, male    DOB: 13-Jan-1961, 56 y.o.   MRN: [id number]  05/03/2017  Medication Refill (albuterol, norvasc, qvar, temovate, vanos, fluocinoline acetonide (oil)) and Medication Refill (flonase, hydrodiuril, nizoral singulair crestor)   HPI This 56 y.o. male presents for one year follow-up of chronic medical conditions including hypertension, hypercholesterolemia, glucose intolerance, asthma, allergic rhinitis, abnormal PSA.  Patient reports good compliance with medication, good tolerance to medication, and good symptom control.    Going to gym every night with son.   Home BP running 131/83. Dermatologist is no one right now.  Palpitations: heart rate increases every morning at 4:00am above 120s.  s/p sleep study in 2014.  Underwent uvuloplasty and tonsillectomy.  Nasal septum repair.  UPcoming urology appointment in July 2018.   Immunization History  Administered Date(s) Administered  . Hepatitis A, Adult 06/27/2014, 05/19/2016  . Hepatitis B, adult 06/27/2014, 09/07/2014, 12/28/2014  . Meningococcal Conjugate 06/27/2014  . Tdap 06/27/2014   BP Readings from Last 3 Encounters:  05/03/17 136/87  12/05/16 120/83  11/11/16 126/88   Wt Readings from Last 3 Encounters:  05/03/17 185 lb 9.6 oz (84.2 kg)  12/05/16 180 lb (81.6 kg)  11/11/16 180 lb (81.6 kg)      Review of Systems  Constitutional: Negative for activity change, appetite change, chills, diaphoresis, fatigue and fever.  Eyes: Positive for pain. Negative for photophobia, discharge, redness, itching and visual disturbance.  Respiratory: Negative for cough and shortness of breath.   Cardiovascular: Positive for palpitations. Negative for chest pain and leg swelling.  Gastrointestinal: Negative for abdominal pain, diarrhea, nausea and vomiting.  Endocrine: Negative for cold intolerance, heat intolerance, polydipsia, polyphagia and polyuria.  Skin: Negative for color change, rash and  wound.  Neurological: Negative for dizziness, tremors, seizures, syncope, facial asymmetry, speech difficulty, weakness, light-headedness, numbness and headaches.  Psychiatric/Behavioral: Negative for dysphoric mood and sleep disturbance. The patient is not nervous/anxious.     Past Medical History:  Diagnosis Date  . Allergy    Flonase, Patanol, Zyrtec  . Arthritis    psoriasis  . Asthma    seasonal, with allergies  . Borderline diabetes   . Colon polyp 10/27/2011  . Depression    denies 11/11/16  . GERD (gastroesophageal reflux disease)   . History of kidney stones    x1  . Hyperlipidemia   . Hypertension   . Low back pain    "in kidney area-was told that it was related to gallstones"  . Psoriasis    [name]  . 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 APPENDECTOMY    . CHOLECYSTECTOMY N/A 12/16/2014   Procedure: LAPAROSCOPIC CHOLECYSTECTOMY WITH INTRAOPERATIVE CHOLANGIOGRAM;  Surgeon: [name], MD;  Location: [facility];  Service: General;  Laterality: N/A;  . COLONOSCOPY  10/27/2011   single polyp. Repeat 5 years.  Marland Kitchen POLYPECTOMY    . PROSTATE BIOPSY  10/2014   will have another in 3 months  . SEPTOPLASTY  2014  . TONSILLECTOMY  2014  . UVULOPALATOPHARYNGOPLASTY  2014   No Known Allergies  Social History   Social History  . Marital status: Divorced    Spouse name: N/A  . Number of children: 1  . Years of education: N/A   Occupational History  . TEACHER [employer]   Social History Main Topics  . Smoking  status: Never Smoker  . Smokeless tobacco: Never Used  . Alcohol use No  . Drug use: No  . Sexual activity: Yes     Comment: bisexual   Other Topics Concern  . Not on file   Social History Narrative   Original from [place]    Marital status: separated x 1 year after 13 years of marriage; not dating.   Children:  1 child (15).    Lives: with roommate; sees son daily   Employment: Pharmacist, hospital; transition from Administration to teaching again in 2015; moved from [place], [place] in 2015; [employer] teacher             Family History  Problem Relation Age of Onset  . Colon cancer Neg Hx   . Esophageal cancer Neg Hx   . Stomach cancer Neg Hx   . Prostate cancer Neg Hx   . Diabetes Neg Hx   . CAD Neg Hx        Objective:    BP 136/87 (BP Location: Right Arm, Patient Position: Sitting, Cuff Size: Large)   Pulse 60   Temp 98 F (36.7 C) (Oral)   Resp 16   Ht 5\' 5"  (1.651 m)   Wt 185 lb 9.6 oz (84.2 kg)   SpO2 96%   BMI 30.89 kg/m  Physical Exam  Constitutional: He is oriented to person, place, and time. He appears well-developed and well-nourished. No distress.  HENT:  Head: Normocephalic and atraumatic.  Eyes: Conjunctivae and EOM are normal. Pupils are equal, round, and reactive to light. Left eye exhibits hordeolum.  Neck: Normal range of motion. Neck supple. Carotid bruit is not present. No thyromegaly present.  Cardiovascular: Normal rate, regular rhythm, normal heart sounds and intact distal pulses.  Exam reveals no gallop and no friction rub.   No murmur heard. Pulmonary/Chest: Effort normal and breath sounds normal. He has no wheezes. He has no rales.  Lymphadenopathy:    He has no cervical adenopathy.  Neurological: He is alert and oriented to person, place, and time. No cranial nerve deficit.  Skin: Skin is warm and dry. No rash noted. He is not diaphoretic.  Psychiatric: He has a normal mood and affect. His behavior is normal.  Nursing note and vitals reviewed.  Depression screen [facility] 2/9 05/03/2017 09/21/2016 05/19/2016 01/27/2016 01/13/2016  Decreased Interest 0 0 0 0 0  Down, Depressed, Hopeless 0 0 0 0 0  PHQ - 2 Score 0 0 0 0 0        Assessment & Plan:   1. Essential hypertension   2. Seasonal allergic rhinitis due to pollen   3. Moderate persistent asthma in adult  without complication   4. OBSTRUCTIVE SLEEP APNEA   5. Glucose intolerance (impaired glucose tolerance)   6. Psoriasis   7. Pure hypercholesterolemia   8. Multiple lung nodules   9. TUBERCULOSIS, HX OF   10. Elevated PSA   11. Hordeolum externum of left upper eyelid    -nighttime palpitations; pt desires repeat sleep study to evaluation for hypoxia at night contributing/causing palpitations. -refer to ENT as well; pt concerned regarding previous uvuloplasty. -new onset L upper lid hordeolum; recommend warm compresses bid to tid.  -obtain labs -refills provided.   Orders Placed This Encounter  Procedures  . CBC with Differential/Platelet  . Comprehensive metabolic panel    Order Specific Question:   Has the patient fasted?    Answer:   Yes  . Hemoglobin A1c  .  Lipid panel    Order Specific Question:   Has the patient fasted?    Answer:   Yes  . PSA  . TSH  . Microalbumin, urine  . Ambulatory referral to ENT    Referral Priority:   Routine    Referral Type:   Consultation    Referral Reason:   Specialty Services Required    Requested Specialty:   Otolaryngology    Number of Visits Requested:   1  . Ambulatory referral to Sleep Studies    Referral Priority:   Routine    Referral Type:   Consultation    Referral Reason:   Specialty Services Required    Number of Visits Requested:   1  . EKG 12-Lead   Meds ordered this encounter  Medications  . amLODipine (NORVASC) 5 MG tablet    Sig: Take 1 tablet (5 mg total) by mouth daily.    Dispense:  90 tablet    Refill:  1    Needs office visit for refills  . beclomethasone (QVAR) 80 MCG/ACT inhaler    Sig: Inhale 2 puffs into the lungs 2 (two) times daily.    Dispense:  1 Inhaler    Refill:  3  . clobetasol ointment (TEMOVATE) 0.05 %    Sig: Apply 1 application topically 2 (two) times daily.    Dispense:  30 g    Refill:  1  . fluticasone (FLONASE) 50 MCG/ACT nasal spray    Sig: USE 2 SPRAYS IN EACH NOSTRIL EVERY DAY     Dispense:  16 g    Refill:  0  . hydrochlorothiazide (HYDRODIURIL) 12.5 MG tablet    Sig: Take 1 tablet (12.5 mg total) by mouth daily.    Dispense:  90 tablet    Refill:  1  . ketoconazole (NIZORAL) 2 % shampoo    Sig: APPLY EXTERNALLY 2 TIMES A WEEK.    Dispense:  120 mL    Refill:  3  . montelukast (SINGULAIR) 10 MG tablet    Sig: TAKE 1 TABLET(10 MG) BY MOUTH AT BEDTIME    Dispense:  90 tablet    Refill:  1  . rosuvastatin (CRESTOR) 10 MG tablet    Sig: Take 1 tablet (10 mg total) by mouth daily.    Dispense:  90 tablet    Refill:  1    Return in about 6 months (around 11/03/2017) for complete physical examiniation.   Kristi Elayne Guerin, M.D. Primary Care at [facility] [address] [place], [place]  [address] [phone] phone [phone] fax

## 2017-05-03 NOTE — Patient Instructions (Addendum)
   IF you received an x-ray today, you will receive an invoice from [facility]. Please contact [facility] at [phone] with questions or concerns regarding your invoice.   IF you received labwork today, you will receive an invoice from [facility]. Please contact [facility] at [phone] with questions or concerns regarding your invoice.   Our billing staff will not be able to assist you with questions regarding bills from these companies.  You will be contacted with the lab results as soon as they are available. The fastest way to get your results is to activate your My Chart account. Instructions are located on the last page of this paperwork. If you have not heard from us regarding the results in 2 weeks, please contact this office.      Fat and Cholesterol Restricted Diet Getting too much fat and cholesterol in your diet may cause health problems. Following this diet helps keep your fat and cholesterol at normal levels. This can keep you from getting sick. What types of fat should I choose?  Choose monosaturated and polyunsaturated fats. These are found in foods such as olive oil, canola oil, flaxseeds, walnuts, almonds, and seeds.  Eat more omega-3 fats. Good choices include salmon, mackerel, sardines, tuna, flaxseed oil, and ground flaxseeds.  Limit saturated fats. These are in animal products such as meats, butter, and cream. They can also be in plant products such as palm oil, palm kernel oil, and coconut oil.  Avoid foods with partially hydrogenated oils in them. These contain trans fats. Examples of foods that have trans fats are stick margarine, some tub margarines, cookies, crackers, and other baked goods. What general guidelines do I need to follow?  Check food labels. Look for the words "trans fat" and "saturated fat."  When preparing a meal:  Fill half of your plate with vegetables and green salads.  Fill one fourth of your plate with whole  grains. Look for the word "whole" as the first word in the ingredient list.  Fill one fourth of your plate with lean protein foods.  Eat more foods that have fiber, like apples, carrots, beans, peas, and barley.  Eat more home-cooked foods. Eat less at restaurants and buffets.  Limit or avoid alcohol.  Limit foods high in starch and sugar.  Limit fried foods.  Cook foods without frying them. Baking, boiling, grilling, and broiling are all great options.  Lose weight if you are overweight. Losing even a small amount of weight can help your overall health. It can also help prevent diseases such as diabetes and heart disease. What foods can I eat? Grains  Whole grains, such as whole wheat or whole grain breads, crackers, cereals, and pasta. Unsweetened oatmeal, bulgur, barley, quinoa, or brown rice. Corn or whole wheat flour tortillas. Vegetables  Fresh or frozen vegetables (raw, steamed, roasted, or grilled). Green salads. Fruits  All fresh, canned (in natural juice), or frozen fruits. Meat and Other Protein Products  Ground beef (85% or leaner), grass-fed beef, or beef trimmed of fat. Skinless chicken or turkey. Ground chicken or turkey. Pork trimmed of fat. All fish and seafood. Eggs. Dried beans, peas, or lentils. Unsalted nuts or seeds. Unsalted canned or dry beans. Dairy  Low-fat dairy products, such as skim or 1% milk, 2% or reduced-fat cheeses, low-fat ricotta or cottage cheese, or plain low-fat yogurt. Fats and Oils  Tub margarines without trans fats. Light or reduced-fat mayonnaise and salad dressings. Avocado. Olive, canola, sesame, or safflower oils. Natural peanut   or almond butter (choose ones without added sugar and oil). The items listed above may not be a complete list of recommended foods or beverages. Contact your dietitian for more options.  What foods are not recommended? Grains  White bread. White pasta. White rice. Cornbread. Bagels, pastries, and croissants.  Crackers that contain trans fat. Vegetables  White potatoes. Corn. Creamed or fried vegetables. Vegetables in a cheese sauce. Fruits  Dried fruits. Canned fruit in light or heavy syrup. Fruit juice. Meat and Other Protein Products  Fatty cuts of meat. Ribs, chicken wings, bacon, sausage, bologna, salami, chitterlings, fatback, hot dogs, bratwurst, and packaged luncheon meats. Liver and organ meats. Dairy  Whole or 2% milk, cream, half-and-half, and cream cheese. Whole milk cheeses. Whole-fat or sweetened yogurt. Full-fat cheeses. Nondairy creamers and whipped toppings. Processed cheese, cheese spreads, or cheese curds. Sweets and Desserts  Corn syrup, sugars, honey, and molasses. Candy. Jam and jelly. Syrup. Sweetened cereals. Cookies, pies, cakes, donuts, muffins, and ice cream. Fats and Oils  Butter, stick margarine, lard, shortening, ghee, or bacon fat. Coconut, palm kernel, or palm oils. Beverages  Alcohol. Sweetened drinks (such as sodas, lemonade, and fruit drinks or punches). The items listed above may not be a complete list of foods and beverages to avoid. Contact your dietitian for more information.  This information is not intended to replace advice given to you by your health care provider. Make sure you discuss any questions you have with your health care provider. Document Released: 06/12/2012 Document Revised: 08/18/2016 Document Reviewed: 03/13/2014 Elsevier Interactive Patient Education  2017 Elsevier Inc.  

## 2017-05-04 LAB — COMPREHENSIVE METABOLIC PANEL
A/G RATIO: 1.9 (ref 1.2–2.2)
ALBUMIN: 4.8 g/dL (ref 3.5–5.5)
ALT: 27 IU/L (ref 0–44)
AST: 23 IU/L (ref 0–40)
Alkaline Phosphatase: 60 IU/L (ref 39–117)
BUN / CREAT RATIO: 11 (ref 9–20)
BUN: 11 mg/dL (ref 6–24)
Bilirubin Total: 0.7 mg/dL (ref 0.0–1.2)
CALCIUM: 9.7 mg/dL (ref 8.7–10.2)
CO2: 27 mmol/L (ref 18–29)
Chloride: 102 mmol/L (ref 96–106)
Creatinine, Ser: 1.01 mg/dL (ref 0.76–1.27)
GFR, EST AFRICAN AMERICAN: 96 mL/min/{1.73_m2} (ref >59)
GFR, EST NON AFRICAN AMERICAN: 83 mL/min/{1.73_m2} (ref >59)
GLOBULIN, TOTAL: 2.5 g/dL (ref 1.5–4.5)
Glucose: 90 mg/dL (ref 65–99)
POTASSIUM: 4.2 mmol/L (ref 3.5–5.2)
SODIUM: 142 mmol/L (ref 134–144)
TOTAL PROTEIN: 7.3 g/dL (ref 6.0–8.5)

## 2017-05-04 LAB — LIPID PANEL
CHOLESTEROL TOTAL: 136 mg/dL (ref 100–199)
Chol/HDL Ratio: 3.5 ratio (ref 0.0–5.0)
HDL: 39 mg/dL — AB (ref >39)
LDL Calculated: 56 mg/dL (ref 0–99)
Triglycerides: 204 mg/dL — ABNORMAL HIGH (ref 0–149)
VLDL Cholesterol Cal: 41 mg/dL — ABNORMAL HIGH (ref 5–40)

## 2017-05-04 LAB — CBC WITH DIFFERENTIAL/PLATELET
BASOS: 1 %
Basophils Absolute: 0 10*3/uL (ref 0.0–0.2)
EOS (ABSOLUTE): 0 10*3/uL (ref 0.0–0.4)
EOS: 1 %
Hematocrit: 48.2 % (ref 37.5–51.0)
Hemoglobin: 16.9 g/dL (ref 13.0–17.7)
IMMATURE GRANS (ABS): 0 10*3/uL (ref 0.0–0.1)
IMMATURE GRANULOCYTES: 0 %
Lymphocytes Absolute: 1 10*3/uL (ref 0.7–3.1)
Lymphs: 19 %
MCH: 31 pg (ref 26.6–33.0)
MCHC: 35.1 g/dL (ref 31.5–35.7)
MCV: 88 fL (ref 79–97)
MONOS ABS: 0.4 10*3/uL (ref 0.1–0.9)
Monocytes: 7 %
NEUTROS PCT: 72 %
Neutrophils Absolute: 3.8 10*3/uL (ref 1.4–7.0)
Platelets: 246 10*3/uL (ref 150–379)
RBC: 5.45 x10E6/uL (ref 4.14–5.80)
RDW: 13.8 % (ref 12.3–15.4)
WBC: 5.3 10*3/uL (ref 3.4–10.8)

## 2017-05-04 LAB — PSA: PROSTATE SPECIFIC AG, SERUM: 6.7 ng/mL — AB (ref 0.0–4.0)

## 2017-05-04 LAB — HEMOGLOBIN A1C
ESTIMATED AVERAGE GLUCOSE: 128 mg/dL
HEMOGLOBIN A1C: 6.1 % — AB (ref 4.8–5.6)

## 2017-05-04 LAB — MICROALBUMIN, URINE: Microalbumin, Urine: 43.4 ug/mL

## 2017-05-04 LAB — TSH: TSH: 2.54 u[IU]/mL (ref 0.450–4.500)

## 2017-05-17 ENCOUNTER — Telehealth: Payer: Self-pay | Admitting: Family Medicine

## 2017-05-17 NOTE — Telephone Encounter (Signed)
PATIENT SAW DR. Tamala Julian 2 WEEKS AGO AND HE SAID SHE DID A LOT OF BLOOD WORK ON HIM. HE WOULD  LIKE TO GET THE RESULTS. BEST PHONE [phone] (CELL) PHARMACY CHOICE IS [facility] ON [address]. [place]

## 2017-05-17 NOTE — Telephone Encounter (Signed)
Please see abn and advise

## 2017-05-19 NOTE — Telephone Encounter (Signed)
Call --- 1.  PSA is elevated at 6.7 which is higher than last check; it is very important to keep urology appointment in July. 2. Triglyceride level is elevated at 204 yet cholesterol is well controlled. I recommend weight loss and low fat food choices.  3.  Hemoglobin A1c is elevated at 6.1 which is in the prediabetic range.  I recommend low-sugar low-carbohydrate food choices.

## 2017-05-22 ENCOUNTER — Encounter: Payer: Self-pay | Admitting: Family Medicine

## 2017-05-23 NOTE — Telephone Encounter (Signed)
Pt advised and asked me to give copies which I left up front

## 2017-05-25 ENCOUNTER — Telehealth: Payer: Self-pay | Admitting: Family Medicine

## 2017-05-25 NOTE — Telephone Encounter (Signed)
Called pt regarding ENT referral to see where he was wanting to go. Pt said he would like to hold off on ENT referral for now and focus on Urology referral due to PSA number. Thanks!

## 2017-05-30 ENCOUNTER — Other Ambulatory Visit: Payer: Self-pay | Admitting: Family Medicine

## 2017-06-01 ENCOUNTER — Telehealth: Payer: Self-pay

## 2017-06-01 NOTE — Telephone Encounter (Signed)
Form being Faxed over to begin PA  QVAR AWAITING FAX

## 2017-06-03 NOTE — Telephone Encounter (Signed)
PA Form faxed in QVAR  Please complete highlighted items & sign Form Placed in provider box

## 2017-06-06 NOTE — Telephone Encounter (Signed)
What steroid inhalers will insurance cover?

## 2017-06-07 ENCOUNTER — Institutional Professional Consult (permissible substitution): Payer: Self-pay | Admitting: Neurology

## 2017-06-09 MED ORDER — FLUTICASONE PROPIONATE HFA 110 MCG/ACT IN AERO: 2.0000 | INHALATION_SPRAY | Freq: Two times a day (BID) | RESPIRATORY_TRACT | 4 refills | Status: DC

## 2017-06-09 NOTE — Telephone Encounter (Signed)
Insurance will cover Flovent which is mostly substituted

## 2017-06-09 NOTE — Telephone Encounter (Signed)
Call --- insurance will no longer cover QVAR. I have substituted Flovent for the patient instead.  He should use 2 puffs twice daily of Flovent.

## 2017-06-09 NOTE — Telephone Encounter (Signed)
I lm on vm advising pt of MD notes and to call me back if he had any questions or concerns

## 2017-06-09 NOTE — Telephone Encounter (Signed)
I spoke with the pt and advised him to call his insurance company and ask for pharmacy benefits, to find out what steroid inhalers they will cover. The pt verbalized understanding and will call me back with the information.

## 2017-06-16 ENCOUNTER — Other Ambulatory Visit: Payer: Self-pay | Admitting: Physician Assistant

## 2017-06-16 ENCOUNTER — Other Ambulatory Visit: Payer: Self-pay | Admitting: Family Medicine

## 2017-06-16 DIAGNOSIS — R21 Rash and other nonspecific skin eruption: Secondary | ICD-10-CM

## 2017-07-30 ENCOUNTER — Other Ambulatory Visit: Payer: Self-pay | Admitting: Family Medicine

## 2017-08-02 ENCOUNTER — Other Ambulatory Visit: Payer: Self-pay | Admitting: Family Medicine

## 2017-08-02 DIAGNOSIS — R21 Rash and other nonspecific skin eruption: Secondary | ICD-10-CM

## 2017-08-09 ENCOUNTER — Ambulatory Visit (INDEPENDENT_AMBULATORY_CARE_PROVIDER_SITE_OTHER): Payer: BC Managed Care – PPO | Admitting: Family Medicine

## 2017-08-09 ENCOUNTER — Encounter: Payer: Self-pay | Admitting: Family Medicine

## 2017-08-09 VITALS — BP 120/87 | HR 71 | Temp 98.6°F | Resp 18 | Ht 65.0 in | Wt 181.0 lb

## 2017-08-09 DIAGNOSIS — I10 Essential (primary) hypertension: Secondary | ICD-10-CM

## 2017-08-09 DIAGNOSIS — R972 Elevated prostate specific antigen [PSA]: Secondary | ICD-10-CM | POA: Diagnosis not present

## 2017-08-09 DIAGNOSIS — R7302 Impaired glucose tolerance (oral): Secondary | ICD-10-CM

## 2017-08-09 MED ORDER — BLOOD GLUCOSE MONITOR KIT: PACK | 0 refills | Status: DC

## 2017-08-09 NOTE — Progress Notes (Signed)
Subjective:    Patient ID: Rickey Cruz, male    DOB: 03/17/1961, 56 y.o.   MRN: [id number]  08/09/2017  Medication Refill (Flucinolone. Oil &   Flucinonide cream) and Other (Pt would like Diabetic Monitor that attaches to skin. Also wants PSA check)   HPI This 56 y.o. male presents for three month follow-up of hypertension, hypercholesterolemia, glucose intolerance, psoriasis.  Patient reports good compliance with medication, good tolerance to medication, and good symptom control.  Disappointed in weight.  Partner is an excellent cook.  Loves fruit.  Avoids bread/pastas/carboydrates. Rare desserts.   B: omelet with vegetables L: healthy choice lunch S: healthy lunch, [employer] Walking 30 minutes daily; [employer] 4 pounds.  Elevated PSA: saw July 3rd, 2018; not concerned about PSA; fluctuating PSA.  Same sexual relationship for 16 years; very happy.  No STDs recently.  BP Readings from Last 3 Encounters:  08/09/17 120/87  05/03/17 136/87  12/05/16 120/83   Wt Readings from Last 3 Encounters:  08/09/17 181 lb (82.1 kg)  05/03/17 185 lb 9.6 oz (84.2 kg)  12/05/16 180 lb (81.6 kg)   Immunization History  Administered Date(s) Administered  . Hepatitis A, Adult 06/27/2014, 05/19/2016  . Hepatitis B, adult 06/27/2014, 09/07/2014, 12/28/2014  . Meningococcal Conjugate 06/27/2014  . Tdap 06/27/2014    Review of Systems  Constitutional: Negative for activity change, appetite change, chills, diaphoresis, fatigue and fever.  Respiratory: Negative for cough and shortness of breath.   Cardiovascular: Negative for chest pain, palpitations and leg swelling.  Gastrointestinal: Negative for abdominal pain, diarrhea, nausea and vomiting.  Endocrine: Negative for cold intolerance, heat intolerance, polydipsia, polyphagia and polyuria.  Skin: Negative for color change, rash and wound.  Neurological: Negative for dizziness, tremors, seizures, syncope, facial asymmetry, speech  difficulty, weakness, light-headedness, numbness and headaches.  Psychiatric/Behavioral: Negative for dysphoric mood and sleep disturbance. The patient is not nervous/anxious.     Past Medical History:  Diagnosis Date  . Allergy    Flonase, Patanol, Zyrtec  . Arthritis    psoriasis  . Asthma    seasonal, with allergies  . Borderline diabetes   . Colon polyp 10/27/2011  . Depression    denies 11/11/16  . GERD (gastroesophageal reflux disease)   . History of kidney stones    x1  . Hyperlipidemia   . Hypertension   . Low back pain    "in kidney area-was told that it was related to gallstones"  . Psoriasis    [name]  . 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 APPENDECTOMY    . CHOLECYSTECTOMY N/A 12/16/2014   Procedure: LAPAROSCOPIC CHOLECYSTECTOMY WITH INTRAOPERATIVE CHOLANGIOGRAM;  Surgeon: [name], MD;  Location: [facility];  Service: General;  Laterality: N/A;  . COLONOSCOPY  10/27/2011   single polyp. Repeat 5 years.  Marland Kitchen POLYPECTOMY    . PROSTATE BIOPSY  10/2014   will have another in 3 months  . SEPTOPLASTY  2014  . TONSILLECTOMY  2014  . UVULOPALATOPHARYNGOPLASTY  2014   No Known Allergies  Social History   Social History  . Marital status: Divorced    Spouse name: N/A  . Number of children: 1  . Years of education: N/A   Occupational History  . TEACHER [employer]   Social History Main Topics  . Smoking status: Never Smoker  . Smokeless tobacco: Never Used  .  Alcohol use No  . Drug use: No  . Sexual activity: Yes     Comment: bisexual   Other Topics Concern  . Not on file   Social History Narrative   Original from [place]    Marital status: separated x 1 year after 13 years of marriage; not dating.   Children:  1 child (15).   Lives: with roommate; sees son daily   Employment: Pharmacist, hospital; transition from Administration to  teaching again in 2015; moved from [place], [place] in 2015; [employer] teacher             Family History  Problem Relation Age of Onset  . Colon cancer Neg Hx   . Esophageal cancer Neg Hx   . Stomach cancer Neg Hx   . Prostate cancer Neg Hx   . Diabetes Neg Hx   . CAD Neg Hx        Objective:    BP 120/87   Pulse 71   Temp 98.6 F (37 C) (Oral)   Resp 18   Ht _0  (1.651 m)   Wt 181 lb (82.1 kg)   SpO2 96%   BMI 30.12 kg/m  Physical Exam  Constitutional: He is oriented to person, place, and time. He appears well-developed and well-nourished. No distress.  HENT:  Head: Normocephalic and atraumatic.  Right Ear: External ear normal.  Left Ear: External ear normal.  Nose: Nose normal.  Mouth/Throat: Oropharynx is clear and moist.  Eyes: Pupils are equal, round, and reactive to light. Conjunctivae and EOM are normal.  Neck: Normal range of motion. Neck supple. Carotid bruit is not present. No thyromegaly present.  Cardiovascular: Normal rate, regular rhythm, normal heart sounds and intact distal pulses.  Exam reveals no gallop and no friction rub.   No murmur heard. Pulmonary/Chest: Effort normal and breath sounds normal. He has no wheezes. He has no rales.  Abdominal: Soft. Bowel sounds are normal. He exhibits no distension and no mass. There is no tenderness. There is no rebound and no guarding.  Lymphadenopathy:    He has no cervical adenopathy.  Neurological: He is alert and oriented to person, place, and time. No cranial nerve deficit.  Skin: Skin is warm and dry. No rash noted. He is not diaphoretic.  Psychiatric: He has a normal mood and affect. His behavior is normal.  Nursing note and vitals reviewed.   No results found. Depression screen [facility] 2/9 08/09/2017 05/03/2017 09/21/2016 05/19/2016 01/27/2016  Decreased Interest 0 0 0 0 0  Down, Depressed, Hopeless 0 0 0 0 0  PHQ - 2 Score 0 0 0 0 0   Fall Risk  08/09/2017 05/03/2017 09/21/2016 05/19/2016    Falls in the past year? No No No No        Assessment & Plan:   1. Glucose intolerance (impaired glucose tolerance)   2. Elevated PSA   3. Essential hypertension    -persistent glucose intolerance; obtain labs.   I recommend weight loss, exercise, and low-carbohydrate low-sugar food choices. You should AVOID: regular sodas, sweetened tea, fruit juices.  You should LIMIT: breads, pastas, rice, potatoes, and desserts/sweets.  I would recommend limiting your total carbohydrate intake per meal to 45 grams; I would limit your total carbohydrate intake per snack to 30 grams.  I would also have a goal of 60 grams of protein intake per day; this would equal 10-15 grams of protein per meal and 5-10 grams of protein per snack. -refer for  nutrition counseling. -recommend weight loss, exercise for 30-60 minutes five days per week; recommend 1200 kcal restriction per day with a minimum of 60 grams of protein per day. -recent elevated PSA at urology; pt requesting repeat PSA today; has changed to safer sexual practices.   Orders Placed This Encounter  Procedures  . PSA  . Comprehensive metabolic panel  . Hemoglobin A1c  . Amb Referral to Nutrition and Diabetic E    Referral Priority:   Routine    Referral Type:   Consultation    Referral Reason:   Specialty Services Required    Number of Visits Requested:   1   Meds ordered this encounter  Medications  . blood glucose meter kit and supplies KIT    Sig: Check sugar once daily Dx: glucose intolerance    Dispense:  1 each    Refill:  0    Order Specific Question:   Number of strips    Answer:   100    Order Specific Question:   Number of lancets    Answer:   100    Return in about 3 months (around 11/09/2017) for complete physical examiniation.   Terriyah Westra Elayne Guerin, M.D. Primary Care at [facility] previously [facility] [address] [place], [place]  [address] [phone] phone [phone] fax

## 2017-08-09 NOTE — Patient Instructions (Signed)
     IF you received an x-ray today, you will receive an invoice from [facility]. Please contact [facility] at [phone] with questions or concerns regarding your invoice.   IF you received labwork today, you will receive an invoice from [facility]. Please contact [facility] at [phone] with questions or concerns regarding your invoice.   Our billing staff will not be able to assist you with questions regarding bills from these companies.  You will be contacted with the lab results as soon as they are available. The fastest way to get your results is to activate your My Chart account. Instructions are located on the last page of this paperwork. If you have not heard from us regarding the results in 2 weeks, please contact this office.     

## 2017-08-10 LAB — COMPREHENSIVE METABOLIC PANEL
ALT: 29 IU/L (ref 0–44)
AST: 27 IU/L (ref 0–40)
Albumin/Globulin Ratio: 2 (ref 1.2–2.2)
Albumin: 4.7 g/dL (ref 3.5–5.5)
Alkaline Phosphatase: 54 IU/L (ref 39–117)
BUN/Creatinine Ratio: 18 (ref 9–20)
BUN: 17 mg/dL (ref 6–24)
Bilirubin Total: 0.6 mg/dL (ref 0.0–1.2)
CALCIUM: 9.6 mg/dL (ref 8.7–10.2)
CO2: 24 mmol/L (ref 20–29)
CREATININE: 0.97 mg/dL (ref 0.76–1.27)
Chloride: 100 mmol/L (ref 96–106)
GFR calc Af Amer: 100 mL/min/{1.73_m2} (ref >59)
GFR, EST NON AFRICAN AMERICAN: 87 mL/min/{1.73_m2} (ref >59)
GLUCOSE: 107 mg/dL — AB (ref 65–99)
Globulin, Total: 2.4 g/dL (ref 1.5–4.5)
POTASSIUM: 3.6 mmol/L (ref 3.5–5.2)
Sodium: 143 mmol/L (ref 134–144)
Total Protein: 7.1 g/dL (ref 6.0–8.5)

## 2017-08-10 LAB — PSA: PROSTATE SPECIFIC AG, SERUM: 5.5 ng/mL — AB (ref 0.0–4.0)

## 2017-08-10 LAB — HEMOGLOBIN A1C
Est. average glucose Bld gHb Est-mCnc: 123 mg/dL
HEMOGLOBIN A1C: 5.9 % — AB (ref 4.8–5.6)

## 2017-09-21 ENCOUNTER — Encounter: Payer: Self-pay | Admitting: Registered"

## 2017-09-21 ENCOUNTER — Encounter: Payer: BC Managed Care – PPO | Attending: Family Medicine | Admitting: Registered"

## 2017-09-21 DIAGNOSIS — R7302 Impaired glucose tolerance (oral): Secondary | ICD-10-CM | POA: Diagnosis not present

## 2017-09-21 DIAGNOSIS — Z713 Dietary counseling and surveillance: Secondary | ICD-10-CM | POA: Insufficient documentation

## 2017-09-21 NOTE — Patient Instructions (Addendum)
   You can stop taking the vitamin B12, and potassium  Next time you go your pharmacy, ask pharmacist if any of your medication deplete folate and if they recommend supplementing with a vitamin.  Continue taking the Tumeric  Food sources for potassium: beans, leafy greens, potatoes, milk  Omega 3 sources: Salmon, Tuna, Trout & barramundi high in omega 3. Branch and flaxseed are plant sources.  Fish oil: Kirkland, Nature's Made, and Nordic Naturals are turstworthy brands. Avoid 3-6-9  Aim to eat balanced meals  Aim to eat mindfully without distractions   Living well with diabetes book Internet link http://diabetes.ada-ksw.com/publication/?i=472991#{"issue_id":472991,"page":0

## 2017-09-21 NOTE — Progress Notes (Signed)
Medical Nutrition Therapy:  Appt start time: 2956 end time:  1715.   Assessment:  Primary concerns today: Pt states he wants to avoid diabetes and is using Lose-it app to track intake and app set his goal at 1774 calories (dr recommended app to lose weight, keep blood sugar low.) Per chart A1c 08/09/17 is 5.9% down from 6.1% on 05/03/17. Patient states he has pretty much cut out sugary drinks, and cut back on fruit. Pt states it is hard to give up are grapes and Ramen noodles. Patient states he would like to get off some of his medication including blood pressure medication.   Pt states he sleeps well since having surgery for sleep apnea.  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   Preferred Learning Style:   No preference indicated   Learning Readiness:   Change in progress   MEDICATIONS: reviewed Supplements pt states he started taking on his own: 1. Tumeric for inflammation 2. Vit B12 because pt states he didn't think he was getting adequate vitamins from his diet 3. Potassium because he has had some shaking symptoms and noticed that eating a banana helped stop the shaking.  DIETARY INTAKE:  24-hr recall:  B ( AM): scrambled eggs, ham, sometimes bread, coffee with cream  Snk ( AM): (used to have chocolate bar but stopped because of sugar) gets a little hungry but usually caught up in work and doesn't eat L ( PM): healthy choice frozen meal (250 cal) Snk ( PM): glucerna bar D ( PM): frozen meal OR gyro wrap without meat (avoiding salt) OR cereal and milk Snk ( PM): none Beverages: water, milk, coffee  Usual physical activity: ADL  Estimated energy needs: 2000 calories 225 g carbohydrates 125 g protein 67 g fat  Progress Towards Goal(s):  In progress.   Nutritional Diagnosis:  NB-1.1 Food and nutrition-related knowledge deficit As related to symptoms or causes of vitamin and mineral  deficiencies.  As evidenced by taking inappropriate supplements to treat self-diagnosed deficiencies of vitamin B12 and potassium.    Intervention:  Nutrition Education. Discussed potential safety issues with taking supplements. Discussed symptoms of low blood sugar. Discussed food groups balanced eating to control blood sugar. Discussed mindful eating.  Plan:  You can stop taking the vitamin B12, and potassium  Next time you go your pharmacy, ask pharmacist if any of your medication deplete folate and if they recommend supplementing with a vitamin.  Continue taking the Tumeric  Food sources for potassium: beans, leafy greens, potatoes, milk  Omega 3 sources: Salmon, Tuna, Trout & barramundi high in omega 3. [place] and flaxseed are plant sources.  Fish oil: Kirkland, Nature's Made, and Nordic Naturals are turstworthy brands. Avoid 3-6-9  Aim to eat balanced meals  Aim to eat mindfully without distractions   Living well with diabetes book Internet link http://diabetes.ada-ksw.com/publication/?i=472991#{"issue_id":472991,"page":0  Teaching Method Utilized:  Visual Auditory  Handouts given during visit include:  My Plate  Egg Nutrition Build a Healthy Plate  Fish guide  O1H What's Your Number  Barriers to learning/adherence to lifestyle change: none  Demonstrated degree of understanding via:  Teach Back   Monitoring/Evaluation:  Dietary intake, exercise, and body weight in 1 month(s).

## 2017-10-01 ENCOUNTER — Other Ambulatory Visit: Payer: Self-pay | Admitting: Family Medicine

## 2017-10-01 ENCOUNTER — Other Ambulatory Visit: Payer: Self-pay | Admitting: Physician Assistant

## 2017-10-02 ENCOUNTER — Other Ambulatory Visit: Payer: Self-pay | Admitting: Family Medicine

## 2017-10-06 ENCOUNTER — Other Ambulatory Visit: Payer: Self-pay | Admitting: Family Medicine

## 2017-10-06 DIAGNOSIS — R21 Rash and other nonspecific skin eruption: Secondary | ICD-10-CM

## 2017-10-24 ENCOUNTER — Ambulatory Visit: Payer: BC Managed Care – PPO | Admitting: Registered"

## 2017-10-29 ENCOUNTER — Other Ambulatory Visit: Payer: Self-pay | Admitting: Family Medicine

## 2017-11-25 ENCOUNTER — Ambulatory Visit (INDEPENDENT_AMBULATORY_CARE_PROVIDER_SITE_OTHER): Payer: BC Managed Care – PPO | Admitting: Family Medicine

## 2017-11-25 ENCOUNTER — Other Ambulatory Visit: Payer: Self-pay

## 2017-11-25 ENCOUNTER — Encounter: Payer: Self-pay | Admitting: Family Medicine

## 2017-11-25 VITALS — BP 118/72 | HR 61 | Temp 98.0°F | Resp 16 | Ht 66.54 in | Wt 178.0 lb

## 2017-11-25 DIAGNOSIS — Z23 Encounter for immunization: Secondary | ICD-10-CM

## 2017-11-25 DIAGNOSIS — R7302 Impaired glucose tolerance (oral): Secondary | ICD-10-CM

## 2017-11-25 DIAGNOSIS — Z0000 Encounter for general adult medical examination without abnormal findings: Secondary | ICD-10-CM | POA: Diagnosis not present

## 2017-11-25 DIAGNOSIS — E7800 Pure hypercholesterolemia, unspecified: Secondary | ICD-10-CM

## 2017-11-25 DIAGNOSIS — R918 Other nonspecific abnormal finding of lung field: Secondary | ICD-10-CM | POA: Diagnosis not present

## 2017-11-25 DIAGNOSIS — Z8611 Personal history of tuberculosis: Secondary | ICD-10-CM | POA: Diagnosis not present

## 2017-11-25 DIAGNOSIS — Z125 Encounter for screening for malignant neoplasm of prostate: Secondary | ICD-10-CM

## 2017-11-25 DIAGNOSIS — L409 Psoriasis, unspecified: Secondary | ICD-10-CM | POA: Diagnosis not present

## 2017-11-25 DIAGNOSIS — J301 Allergic rhinitis due to pollen: Secondary | ICD-10-CM

## 2017-11-25 DIAGNOSIS — R1032 Left lower quadrant pain: Secondary | ICD-10-CM

## 2017-11-25 DIAGNOSIS — K591 Functional diarrhea: Secondary | ICD-10-CM

## 2017-11-25 DIAGNOSIS — Z6828 Body mass index (BMI) 28.0-28.9, adult: Secondary | ICD-10-CM

## 2017-11-25 DIAGNOSIS — J4540 Moderate persistent asthma, uncomplicated: Secondary | ICD-10-CM

## 2017-11-25 DIAGNOSIS — L603 Nail dystrophy: Secondary | ICD-10-CM

## 2017-11-25 DIAGNOSIS — R21 Rash and other nonspecific skin eruption: Secondary | ICD-10-CM

## 2017-11-25 DIAGNOSIS — I10 Essential (primary) hypertension: Secondary | ICD-10-CM

## 2017-11-25 LAB — POCT URINALYSIS DIP (MANUAL ENTRY)
GLUCOSE UA: NEGATIVE mg/dL
NITRITE UA: NEGATIVE
Spec Grav, UA: 1.025 (ref 1.010–1.025)
UROBILINOGEN UA: 0.2 U/dL
pH, UA: 6 (ref 5.0–8.0)

## 2017-11-25 MED ORDER — FLUTICASONE PROPIONATE 50 MCG/ACT NA SUSP: NASAL | 3 refills | Status: DC

## 2017-11-25 MED ORDER — BECLOMETHASONE DIPROPIONATE 80 MCG/ACT IN AERS: 2.0000 | INHALATION_SPRAY | Freq: Two times a day (BID) | RESPIRATORY_TRACT | 3 refills | Status: DC

## 2017-11-25 MED ORDER — KETOCONAZOLE 2 % EX SHAM: MEDICATED_SHAMPOO | CUTANEOUS | 3 refills | Status: DC

## 2017-11-25 MED ORDER — FLUOCINONIDE 0.1 % EX CREA: TOPICAL_CREAM | CUTANEOUS | 0 refills | Status: DC

## 2017-11-25 MED ORDER — MONTELUKAST SODIUM 10 MG PO TABS: ORAL_TABLET | ORAL | 3 refills | Status: DC

## 2017-11-25 MED ORDER — CLOBETASOL PROPIONATE 0.05 % EX OINT: 1.0000 "application " | TOPICAL_OINTMENT | Freq: Two times a day (BID) | CUTANEOUS | 1 refills | Status: DC

## 2017-11-25 MED ORDER — AMLODIPINE BESYLATE 5 MG PO TABS
5.0000 mg | ORAL_TABLET | Freq: Every day | ORAL | 1 refills | Status: DC
Start: 2017-11-25 — End: 2018-05-24

## 2017-11-25 MED ORDER — ALBUTEROL SULFATE HFA 108 (90 BASE) MCG/ACT IN AERS: 2.0000 | INHALATION_SPRAY | RESPIRATORY_TRACT | 1 refills | Status: DC | PRN

## 2017-11-25 MED ORDER — FLUOCINOLONE ACETONIDE 0.01 % EX CREA: TOPICAL_CREAM | CUTANEOUS | 0 refills | Status: DC

## 2017-11-25 MED ORDER — HYDROCHLOROTHIAZIDE 12.5 MG PO TABS: ORAL_TABLET | ORAL | 1 refills | Status: DC

## 2017-11-25 MED ORDER — ROSUVASTATIN CALCIUM 10 MG PO TABS: ORAL_TABLET | ORAL | 1 refills | Status: DC

## 2017-11-25 MED ORDER — FLUOCINOLONE ACETONIDE SCALP 0.01 % EX OIL: TOPICAL_OIL | CUTANEOUS | 0 refills | Status: DC

## 2017-11-25 MED ORDER — OMEPRAZOLE 40 MG PO CPDR: DELAYED_RELEASE_CAPSULE | ORAL | 3 refills | Status: DC

## 2017-11-25 NOTE — Patient Instructions (Addendum)
   IF you received an x-ray today, you will receive an invoice from [facility]. Please contact [facility] at [phone] with questions or concerns regarding your invoice.   IF you received labwork today, you will receive an invoice from [facility]. Please contact [facility] at [phone] with questions or concerns regarding your invoice.   Our billing staff will not be able to assist you with questions regarding bills from these companies.  You will be contacted with the lab results as soon as they are available. The fastest way to get your results is to activate your My Chart account. Instructions are located on the last page of this paperwork. If you have not heard from us regarding the results in 2 weeks, please contact this office.      Preventive Care 40-64 Years, Male Preventive care refers to lifestyle choices and visits with your health care provider that can promote health and wellness. What does preventive care include?  A yearly physical exam. This is also called an annual well check.  Dental exams once or twice a year.  Routine eye exams. Ask your health care provider how often you should have your eyes checked.  Personal lifestyle choices, including: ? Daily care of your teeth and gums. ? Regular physical activity. ? Eating a healthy diet. ? Avoiding tobacco and drug use. ? Limiting alcohol use. ? Practicing safe sex. ? Taking low-dose aspirin every day starting at age 50. What happens during an annual well check? The services and screenings done by your health care provider during your annual well check will depend on your age, overall health, lifestyle risk factors, and family history of disease. Counseling Your health care provider may ask you questions about your:  Alcohol use.  Tobacco use.  Drug use.  Emotional well-being.  Home and relationship well-being.  Sexual activity.  Eating habits.  Work and work  environment.  Screening You may have the following tests or measurements:  Height, weight, and BMI.  Blood pressure.  Lipid and cholesterol levels. These may be checked every 5 years, or more frequently if you are over 50 years old.  Skin check.  Lung cancer screening. You may have this screening every year starting at age 55 if you have a 30-pack-year history of smoking and currently smoke or have quit within the past 15 years.  Fecal occult blood test (FOBT) of the stool. You may have this test every year starting at age 50.  Flexible sigmoidoscopy or colonoscopy. You may have a sigmoidoscopy every 5 years or a colonoscopy every 10 years starting at age 50.  Prostate cancer screening. Recommendations will vary depending on your family history and other risks.  Hepatitis C blood test.  Hepatitis B blood test.  Sexually transmitted disease (STD) testing.  Diabetes screening. This is done by checking your blood sugar (glucose) after you have not eaten for a while (fasting). You may have this done every 1-3 years.  Discuss your test results, treatment options, and if necessary, the need for more tests with your health care provider. Vaccines Your health care provider may recommend certain vaccines, such as:  Influenza vaccine. This is recommended every year.  Tetanus, diphtheria, and acellular pertussis (Tdap, Td) vaccine. You may need a Td booster every 10 years.  Varicella vaccine. You may need this if you have not been vaccinated.  Zoster vaccine. You may need this after age 60.  Measles, mumps, and rubella (MMR) vaccine. You may need at least one dose   of MMR if you were born in 1957 or later. You may also need a second dose.  Pneumococcal 13-valent conjugate (PCV13) vaccine. You may need this if you have certain conditions and have not been vaccinated.  Pneumococcal polysaccharide (PPSV23) vaccine. You may need one or two doses if you smoke cigarettes or if you have  certain conditions.  Meningococcal vaccine. You may need this if you have certain conditions.  Hepatitis A vaccine. You may need this if you have certain conditions or if you travel or work in places where you may be exposed to hepatitis A.  Hepatitis B vaccine. You may need this if you have certain conditions or if you travel or work in places where you may be exposed to hepatitis B.  Haemophilus influenzae type b (Hib) vaccine. You may need this if you have certain risk factors.  Talk to your health care provider about which screenings and vaccines you need and how often you need them. This information is not intended to replace advice given to you by your health care provider. Make sure you discuss any questions you have with your health care provider. Document Released: 01/08/2016 Document Revised: 08/31/2016 Document Reviewed: 10/13/2015 Elsevier Interactive Patient Education  2017 Elsevier Inc.  

## 2017-11-25 NOTE — Progress Notes (Signed)
Subjective:    Patient ID: Rickey Cruz, male    DOB: 10-06-1961, 56 y.o.   MRN: [id number]  11/25/2017  Annual Exam    HPI This 56 y.o. male presents for Complete Physical Examination.  Last physical: few years ago Colonoscopy:  2017 PSA: 2018 Eye exam:  2018. Dental exam:last week  BP Readings from Last 3 Encounters:  11/25/17 118/72  08/09/17 120/87  05/03/17 136/87   Wt Readings from Last 3 Encounters:  11/25/17 178 lb (80.7 kg)  08/09/17 181 lb (82.1 kg)  05/03/17 185 lb 9.6 oz (84.2 kg)   Immunization History  Administered Date(s) Administered  . Hepatitis A, Adult 06/27/2014, 05/19/2016  . Hepatitis B, adult 06/27/2014, 09/07/2014, 12/28/2014  . Meningococcal Conjugate 06/27/2014  . Tdap 06/27/2014    Review of Systems  Constitutional: Negative for activity change, appetite change, chills, diaphoresis, fatigue, fever and unexpected weight change.  HENT: Negative for congestion, dental problem, drooling, ear discharge, ear pain, facial swelling, hearing loss, mouth sores, nosebleeds, postnasal drip, rhinorrhea, sinus pressure, sneezing, sore throat, tinnitus, trouble swallowing and voice change.   Eyes: Negative for photophobia, pain, discharge, redness, itching and visual disturbance.  Respiratory: Negative for apnea, cough, choking, chest tightness, shortness of breath, wheezing and stridor.   Cardiovascular: Negative for chest pain, palpitations and leg swelling.  Gastrointestinal: Negative for abdominal pain, blood in stool, constipation, diarrhea, nausea and vomiting.  Endocrine: Negative for cold intolerance, heat intolerance, polydipsia, polyphagia and polyuria.  Genitourinary: Negative for decreased urine volume, difficulty urinating, discharge, dysuria, enuresis, flank pain, frequency, genital sores, hematuria, penile pain, penile swelling, scrotal swelling, testicular pain and urgency.       Nocturia x 0-2.  Urinary stream is strong.   Sex drive is good.   Erections are good.  Musculoskeletal: Negative for arthralgias, back pain, gait problem, joint swelling, myalgias, neck pain and neck stiffness.  Skin: Negative for color change, pallor, rash and wound.  Allergic/Immunologic: Negative for environmental allergies, food allergies and immunocompromised state.  Neurological: Negative for dizziness, tremors, seizures, syncope, facial asymmetry, speech difficulty, weakness, light-headedness, numbness and headaches.  Hematological: Negative for adenopathy. Does not bruise/bleed easily.  Psychiatric/Behavioral: Negative for agitation, behavioral problems, confusion, decreased concentration, dysphoric mood, hallucinations, self-injury, sleep disturbance and suicidal ideas. The patient is not nervous/anxious and is not hyperactive.        Bedtime is 8-9; wakes up 330-400.    Past Medical History:  Diagnosis Date  . Allergy    Flonase, Patanol, Zyrtec  . Arthritis    psoriasis  . Asthma    seasonal, with allergies  . Borderline diabetes   . Colon polyp 10/27/2011  . Depression    denies 11/11/16  . GERD (gastroesophageal reflux disease)   . History of kidney stones    x1  . Hyperlipidemia   . Hypertension   . Low back pain    "in kidney area-was told that it was related to gallstones"  . Psoriasis    [name]  . 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 APPENDECTOMY    . CHOLECYSTECTOMY N/A 12/16/2014   Procedure: LAPAROSCOPIC CHOLECYSTECTOMY WITH INTRAOPERATIVE CHOLANGIOGRAM;  Surgeon: [name], MD;  Location: [facility];  Service: General;  Laterality: N/A;  . COLONOSCOPY  10/27/2011   single polyp. Repeat 5 years.  Marland Kitchen POLYPECTOMY    . PROSTATE BIOPSY  10/2014   will have another in 3 months  .  SEPTOPLASTY  2014  . TONSILLECTOMY  2014  . UVULOPALATOPHARYNGOPLASTY  2014   No Known Allergies Current Outpatient Medications on File Prior to Visit    Medication Sig Dispense Refill  . blood glucose meter kit and supplies KIT Check sugar once daily Dx: glucose intolerance 1 each 0  . Cyanocobalamin (VITAMIN B-12 PO) Take by mouth.    . fluticasone (FLOVENT HFA) 110 MCG/ACT inhaler Inhale 2 puffs into the lungs 2 (two) times daily. 3 Inhaler 4   No current facility-administered medications on file prior to visit.    Social History   Socioeconomic History  . Marital status: Divorced    Spouse name: Not on file  . Number of children: 1  . Years of education: Not on file  . Highest education level: Not on file  Social Needs  . Financial resource strain: Not on file  . Food insecurity - worry: Not on file  . Food insecurity - inability: Not on file  . Transportation needs - medical: Not on file  . Transportation needs - non-medical: Not on file  Occupational History  . Occupation: Product manager: [employer]    Comment: Pharmacist, community  Tobacco Use  . Smoking status: Never Smoker  . Smokeless tobacco: Never Used  Substance and Sexual Activity  . Alcohol use: No    Alcohol/week: 0.0 oz  . Drug use: No  . Sexual activity: Yes    Comment: [employer]  Other Topics Concern  . Not on file  Social History Narrative   Original from [place]; moved to [place] 1995.   Marital status: same sexual partner 4 X years.   Children:  1 child/son (18) at 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        Tobacco: never      Alcohol: none      Drugs: none       Exercise: daily in 2018; 10,000 steps daily; lots of walking on campus at work      ADLs: independent with ADLs.        Sexual activity: dating males only in 2018; bisexual.  Previously  married to male.              Family History  Problem Relation Age of Onset  . Colon cancer Neg Hx   . Esophageal cancer Neg Hx   . Stomach cancer Neg Hx   . Prostate cancer Neg Hx   . Diabetes Neg Hx   . CAD Neg Hx        Objective:    BP 118/72   Pulse 61   Temp 98 F (36.7 C) (Oral)   Resp 16   Ht 5' 6.54" (1.69 m)   Wt 178 lb (80.7 kg)   SpO2 96%   BMI 28.27 kg/m  Physical Exam  Constitutional: He is oriented to person, place, and time. He appears well-developed and well-nourished. No distress.  HENT:  Head: Normocephalic and atraumatic.  Right Ear: External ear normal.  Left Ear: External ear normal.  Nose: Nose normal.  Mouth/Throat: Oropharynx is clear and moist.  Eyes: Conjunctivae and EOM are normal. Pupils are equal, round, and reactive to light.  Neck: Normal range of  motion. Neck supple. Carotid bruit is not present. No thyromegaly present.  Cardiovascular: Normal rate, regular rhythm, normal heart sounds and intact distal pulses. Exam reveals no gallop and no friction rub.  No murmur heard. Pulmonary/Chest: Effort normal and breath sounds normal. He has no wheezes. He has no rales.  Abdominal: Soft. Bowel sounds are normal. He exhibits no distension and no mass. There is no tenderness. There is no rebound and no guarding.  Musculoskeletal:       Right shoulder: Normal.       Left shoulder: Normal.       Cervical back: Normal.  Lymphadenopathy:    He has no cervical adenopathy.  Neurological: He is alert and oriented to person, place, and time. He has normal reflexes. No cranial nerve deficit. He exhibits normal muscle tone. Coordination normal.  Skin: Skin is warm and dry. No rash noted. He is not diaphoretic.  +dystrophic nails B hands and feet.   Psychiatric: He has a normal mood and affect. His behavior is normal. Judgment normal.   No results found. Depression screen [facility] 2/9 11/25/2017 11/25/2017 09/21/2017 08/09/2017 05/03/2017  Decreased Interest 0  0 0 0 0  Down, Depressed, Hopeless 0 0 0 0 0  PHQ - 2 Score 0 0 0 0 0   Fall Risk  11/25/2017 11/25/2017 09/21/2017 08/09/2017 05/03/2017  Falls in the past year? No No No No No        Assessment & Plan:   1. Routine physical examination   2. Essential hypertension   3. Seasonal allergic rhinitis due to pollen   4. Moderate persistent asthma in adult without complication   5. Glucose intolerance (impaired glucose tolerance)   6. Psoriasis   7. Pure hypercholesterolemia   8. Multiple lung nodules   9. TUBERCULOSIS, HX OF   10. Need for prophylactic vaccination and inoculation against influenza   11. Screening for prostate cancer   12. BMI 28.0-28.9,adult   13. Hordeolum externum of left upper eyelid   14. Rash and nonspecific skin eruption   15. Dystrophic nail   16. Abdominal pain, LLQ   17. Functional diarrhea    -anticipatory guidance provided --- exercise, weight loss, safe driving practices, aspirin 81m daily. -obtain age appropriate screening labs and labs for chronic disease management. -fungal culture obtained of toenails.   -new onset intermittent LLQ pain with diarrhea; benign exam during visit; obtain labs.  Monitor for hte next few months.  RTC for acute worsening. -refills provided for chronic disease management.  Orders Placed This Encounter  Procedures  . Fungus culture w smear    Order Specific Question:   Source    Answer:   toenail  . Flu Vaccine QUAD 36+ mos IM  . Pneumococcal polysaccharide vaccine 23-valent greater than or equal to 2yo subcutaneous/IM  . CBC with Differential/Platelet  . Comprehensive metabolic panel    Order Specific Question:   Has the patient fasted?    Answer:   No  . Hemoglobin A1c  . Lipid panel    Order Specific Question:   Has the patient fasted?    Answer:   No  . PSA  . TSH  . POCT urinalysis dipstick   Meds ordered this encounter  Medications  . rosuvastatin (CRESTOR) 10 MG tablet    Sig: TAKE 1 TABLET(10 MG) BY  MOUTH DAILY    Dispense:  90 tablet    Refill:  1  . omeprazole (PRILOSEC) 40 MG capsule    Sig:  TAKE ONE CAPSULE BY MOUTH EVERY DAY 30 TO 60 MINUTES BEFORE EATING    Dispense:  90 capsule    Refill:  3  . montelukast (SINGULAIR) 10 MG tablet    Sig: TAKE 1 TABLET(10 MG) BY MOUTH AT BEDTIME    Dispense:  90 tablet    Refill:  3  . ketoconazole (NIZORAL) 2 % shampoo    Sig: APPLY EXTERNALLY 2 TIMES A WEEK.    Dispense:  120 mL    Refill:  3  . hydrochlorothiazide (HYDRODIURIL) 12.5 MG tablet    Sig: TAKE 1 TABLET(12.5 MG) BY MOUTH DAILY    Dispense:  90 tablet    Refill:  1  . fluticasone (FLONASE) 50 MCG/ACT nasal spray    Sig: SHAKE LIQUID AND USE 2 SPRAYS IN EACH NOSTRIL EVERY DAY    Dispense:  48 g    Refill:  3  . Fluocinonide 0.1 % CREA    Sig: APPLY EXTERNALLY TO THE AFFECTED AREA DAILY AS NEEDED    Dispense:  60 g    Refill:  0  . Fluocinolone Acetonide Scalp 0.01 % OIL    Sig: APPLY 1 ML TO THE AFFECTED AREA OF SKIN NIGHTLY    Dispense:  118.28 mL    Refill:  0  . fluocinolone (VANOS) 0.01 % cream    Sig: APPLY EXTERNALLY TO THE AFFECTED AREA TWICE DAILY    Dispense:  30 g    Refill:  0  . clobetasol ointment (TEMOVATE) 0.05 %    Sig: Apply 1 application topically 2 (two) times daily.    Dispense:  30 g    Refill:  1  . beclomethasone (QVAR) 80 MCG/ACT inhaler    Sig: Inhale 2 puffs into the lungs 2 (two) times daily.    Dispense:  3 Inhaler    Refill:  3  . amLODipine (NORVASC) 5 MG tablet    Sig: Take 1 tablet (5 mg total) by mouth daily.    Dispense:  90 tablet    Refill:  1  . albuterol (PROAIR HFA) 108 (90 Base) MCG/ACT inhaler    Sig: Inhale 2 puffs into the lungs every 4 (four) hours as needed for wheezing or shortness of breath.    Dispense:  1 Inhaler    Refill:  1    Return in about 6 months (around 05/26/2018) for follow-up chronic medical conditions.   Geordan Xu Elayne Guerin, M.D. Primary Care at [facility] [address] [place], [place]  [address] [phone] phone [phone] fax

## 2017-11-26 ENCOUNTER — Other Ambulatory Visit: Payer: Self-pay | Admitting: Family Medicine

## 2017-11-26 LAB — COMPREHENSIVE METABOLIC PANEL
ALBUMIN: 4.5 g/dL (ref 3.5–5.5)
ALT: 22 IU/L (ref 0–44)
AST: 17 IU/L (ref 0–40)
Albumin/Globulin Ratio: 2 (ref 1.2–2.2)
Alkaline Phosphatase: 51 IU/L (ref 39–117)
BUN / CREAT RATIO: 17 (ref 9–20)
BUN: 18 mg/dL (ref 6–24)
Bilirubin Total: 0.6 mg/dL (ref 0.0–1.2)
CALCIUM: 9.4 mg/dL (ref 8.7–10.2)
CO2: 27 mmol/L (ref 20–29)
CREATININE: 1.05 mg/dL (ref 0.76–1.27)
Chloride: 103 mmol/L (ref 96–106)
GFR, EST AFRICAN AMERICAN: 91 mL/min/{1.73_m2} (ref >59)
GFR, EST NON AFRICAN AMERICAN: 79 mL/min/{1.73_m2} (ref >59)
GLUCOSE: 108 mg/dL — AB (ref 65–99)
Globulin, Total: 2.3 g/dL (ref 1.5–4.5)
Potassium: 3.9 mmol/L (ref 3.5–5.2)
Sodium: 144 mmol/L (ref 134–144)
TOTAL PROTEIN: 6.8 g/dL (ref 6.0–8.5)

## 2017-11-26 LAB — LIPID PANEL
CHOL/HDL RATIO: 2.6 ratio (ref 0.0–5.0)
Cholesterol, Total: 116 mg/dL (ref 100–199)
HDL: 44 mg/dL (ref >39)
LDL CALC: 55 mg/dL (ref 0–99)
TRIGLYCERIDES: 87 mg/dL (ref 0–149)
VLDL CHOLESTEROL CAL: 17 mg/dL (ref 5–40)

## 2017-11-26 LAB — CBC WITH DIFFERENTIAL/PLATELET
BASOS ABS: 0 10*3/uL (ref 0.0–0.2)
Basos: 0 %
EOS (ABSOLUTE): 0 10*3/uL (ref 0.0–0.4)
Eos: 1 %
Hematocrit: 47.4 % (ref 37.5–51.0)
Hemoglobin: 16.5 g/dL (ref 13.0–17.7)
IMMATURE GRANS (ABS): 0 10*3/uL (ref 0.0–0.1)
IMMATURE GRANULOCYTES: 1 %
LYMPHS: 15 %
Lymphocytes Absolute: 0.8 10*3/uL (ref 0.7–3.1)
MCH: 30.9 pg (ref 26.6–33.0)
MCHC: 34.8 g/dL (ref 31.5–35.7)
MCV: 89 fL (ref 79–97)
Monocytes Absolute: 0.4 10*3/uL (ref 0.1–0.9)
Monocytes: 8 %
NEUTROS PCT: 75 %
Neutrophils Absolute: 4.1 10*3/uL (ref 1.4–7.0)
PLATELETS: 262 10*3/uL (ref 150–379)
RBC: 5.34 x10E6/uL (ref 4.14–5.80)
RDW: 13.6 % (ref 12.3–15.4)
WBC: 5.4 10*3/uL (ref 3.4–10.8)

## 2017-11-26 LAB — HEMOGLOBIN A1C
Est. average glucose Bld gHb Est-mCnc: 123 mg/dL
Hgb A1c MFr Bld: 5.9 % — ABNORMAL HIGH (ref 4.8–5.6)

## 2017-11-26 LAB — PSA: PROSTATE SPECIFIC AG, SERUM: 5.1 ng/mL — AB (ref 0.0–4.0)

## 2017-11-26 LAB — TSH: TSH: 1.9 u[IU]/mL (ref 0.450–4.500)

## 2017-11-28 ENCOUNTER — Telehealth: Payer: Self-pay

## 2017-11-28 MED ORDER — FLUOCINONIDE 0.05 % EX CREA: 1.0000 "application " | TOPICAL_CREAM | Freq: Two times a day (BID) | CUTANEOUS | 0 refills | Status: DC

## 2017-11-28 NOTE — Telephone Encounter (Signed)
Sent in Fluocinonide 0.05% cream to local pharmacy.  Please advise patient.

## 2017-11-28 NOTE — Telephone Encounter (Signed)
Dr. Tamala Julian, I received a PA request for Fluocinonide 0.1% cream.  When I called to proceed with the PA, it was denied, however, the following alternatives were given.   Fluocinonide 0.05% cream or gel.   Betamethasole Cobetasole cream Please chose one of these creams or gels for patient's psoriasis. Thanks, [employer]

## 2017-11-28 NOTE — Telephone Encounter (Signed)
LMVM advising patient of medication change.

## 2017-12-20 LAB — FUNGUS CULTURE, BLOOD

## 2017-12-25 ENCOUNTER — Encounter: Payer: Self-pay | Admitting: Family Medicine

## 2017-12-27 ENCOUNTER — Other Ambulatory Visit: Payer: Self-pay | Admitting: Family Medicine

## 2017-12-27 NOTE — Telephone Encounter (Signed)
Vanos refill

## 2017-12-31 ENCOUNTER — Other Ambulatory Visit: Payer: Self-pay | Admitting: Family Medicine

## 2018-01-01 NOTE — Telephone Encounter (Signed)
See attached refills

## 2018-01-02 ENCOUNTER — Other Ambulatory Visit: Payer: Self-pay | Admitting: Family Medicine

## 2018-01-05 ENCOUNTER — Other Ambulatory Visit: Payer: Self-pay | Admitting: Family Medicine

## 2018-01-06 ENCOUNTER — Other Ambulatory Visit: Payer: Self-pay | Admitting: Family Medicine

## 2018-01-21 ENCOUNTER — Other Ambulatory Visit: Payer: Self-pay | Admitting: Family Medicine

## 2018-02-03 ENCOUNTER — Other Ambulatory Visit: Payer: Self-pay | Admitting: Family Medicine

## 2018-02-05 NOTE — Telephone Encounter (Signed)
Olux cream refill request Last OV 11/25/17 with Dr. Leota Jacobsen #[id number][facility], [place]

## 2018-02-19 ENCOUNTER — Other Ambulatory Visit: Payer: Self-pay | Admitting: Family Medicine

## 2018-03-04 ENCOUNTER — Other Ambulatory Visit: Payer: Self-pay | Admitting: Family Medicine

## 2018-03-17 ENCOUNTER — Other Ambulatory Visit: Payer: Self-pay | Admitting: Family Medicine

## 2018-03-28 ENCOUNTER — Other Ambulatory Visit: Payer: Self-pay | Admitting: Family Medicine

## 2018-04-10 ENCOUNTER — Other Ambulatory Visit: Payer: Self-pay | Admitting: Family Medicine

## 2018-04-18 ENCOUNTER — Other Ambulatory Visit: Payer: Self-pay | Admitting: Family Medicine

## 2018-04-30 ENCOUNTER — Ambulatory Visit: Payer: BC Managed Care – PPO | Admitting: Family Medicine

## 2018-04-30 ENCOUNTER — Other Ambulatory Visit: Payer: Self-pay

## 2018-04-30 ENCOUNTER — Other Ambulatory Visit: Payer: Self-pay | Admitting: Family Medicine

## 2018-04-30 ENCOUNTER — Encounter: Payer: Self-pay | Admitting: Family Medicine

## 2018-04-30 VITALS — BP 116/82 | HR 68 | Temp 98.0°F | Resp 16 | Ht 67.32 in | Wt 180.0 lb

## 2018-04-30 DIAGNOSIS — E7800 Pure hypercholesterolemia, unspecified: Secondary | ICD-10-CM

## 2018-04-30 DIAGNOSIS — G4733 Obstructive sleep apnea (adult) (pediatric): Secondary | ICD-10-CM | POA: Diagnosis not present

## 2018-04-30 DIAGNOSIS — R972 Elevated prostate specific antigen [PSA]: Secondary | ICD-10-CM | POA: Diagnosis not present

## 2018-04-30 DIAGNOSIS — I10 Essential (primary) hypertension: Secondary | ICD-10-CM

## 2018-04-30 DIAGNOSIS — F430 Acute stress reaction: Secondary | ICD-10-CM | POA: Diagnosis not present

## 2018-04-30 DIAGNOSIS — G4710 Hypersomnia, unspecified: Secondary | ICD-10-CM

## 2018-04-30 DIAGNOSIS — R7302 Impaired glucose tolerance (oral): Secondary | ICD-10-CM

## 2018-04-30 DIAGNOSIS — R300 Dysuria: Secondary | ICD-10-CM

## 2018-04-30 LAB — POCT GLYCOSYLATED HEMOGLOBIN (HGB A1C): HEMOGLOBIN A1C: 5.8

## 2018-04-30 LAB — GLUCOSE, POCT (MANUAL RESULT ENTRY): POC GLUCOSE: 88 mg/dL (ref 70–99)

## 2018-04-30 MED ORDER — GLUCOSE BLOOD VI STRP: ORAL_STRIP | 3 refills | Status: DC

## 2018-04-30 MED ORDER — FREESTYLE SYSTEM KIT: 1.0000 | PACK | 0 refills | Status: DC | PRN

## 2018-04-30 MED ORDER — LANCETS MISC: 3 refills | Status: DC

## 2018-04-30 MED ORDER — FLUTICASONE PROPIONATE HFA 110 MCG/ACT IN AERO: 2.0000 | INHALATION_SPRAY | Freq: Two times a day (BID) | RESPIRATORY_TRACT | 4 refills | Status: DC

## 2018-04-30 MED ORDER — HYDROCHLOROTHIAZIDE 12.5 MG PO TABS: ORAL_TABLET | ORAL | 1 refills | Status: DC

## 2018-04-30 NOTE — Patient Instructions (Addendum)
  [facility] in [place] in August.  Grant Fontana, MD Delia Chimes, MD Agustina Caroli, MD   IF you received an x-ray today, you will receive an invoice from [facility]. Please contact [facility] at [phone] with questions or concerns regarding your invoice.   IF you received labwork today, you will receive an invoice from [facility]. Please contact [facility] at [phone] with questions or concerns regarding your invoice.   Our billing staff will not be able to assist you with questions regarding bills from these companies.  You will be contacted with the lab results as soon as they are available. The fastest way to get your results is to activate your My Chart account. Instructions are located on the last page of this paperwork. If you have not heard from [place] regarding the results in 2 weeks, please contact this office.

## 2018-04-30 NOTE — Progress Notes (Signed)
 Subjective:    Patient ID: Rickey Cruz, male    DOB: 11/21/1961, 57 y.o.   MRN: [id number]  04/30/2018  Chronic Conditions (6 month follow-up )    HPI This 57 y.o. male presents for three month follow-up hypertension, hypercholesterolemia, glucose intolerance, elevated PSA. Sugar is labile.  Spoke with insurance company about glucometer FreeStyle Device. Struggling with work; interested in career counseling.  Chancellor is calling.  Considering leaving.  Having a lot of promises.  Ready to move on.  Lacking motivation.   Struggling with motivation. Going to the gym majority of days.   Slept all weekend.  No longer using CPAP after surgery.  Weight has fluctuating since surgery.  Met with nutritionist once. +nausea with vomiting with GERD; taking Omeprazole daily.  Occurred daily last week around lunch.     BP Readings from Last 3 Encounters:  04/30/18 116/82  11/25/17 118/72  08/09/17 120/87   Wt Readings from Last 3 Encounters:  04/30/18 180 lb (81.6 kg)  11/25/17 178 lb (80.7 kg)  08/09/17 181 lb (82.1 kg)   Immunization History  Administered Date(s) Administered  . Hepatitis A, Adult 06/27/2014, 05/19/2016  . Hepatitis B, adult 06/27/2014, 09/07/2014, 12/28/2014  . Influenza,inj,Quad PF,6+ Mos 11/25/2017  . Meningococcal Conjugate 06/27/2014  . Pneumococcal Polysaccharide-23 11/25/2017  . Tdap 06/27/2014    Review of Systems  Constitutional: Positive for fatigue. Negative for activity change, appetite change, chills, diaphoresis, fever and unexpected weight change.  HENT: Negative for congestion, dental problem, drooling, ear discharge, ear pain, facial swelling, hearing loss, mouth sores, nosebleeds, postnasal drip, rhinorrhea, sinus pressure, sneezing, sore throat, tinnitus, trouble swallowing and voice change.   Eyes: Negative for photophobia, pain, discharge, redness, itching and visual disturbance.  Respiratory: Negative for apnea, cough, choking, chest tightness,  shortness of breath, wheezing and stridor.   Cardiovascular: Negative for chest pain, palpitations and leg swelling.  Gastrointestinal: Positive for nausea and vomiting. Negative for abdominal distention, abdominal pain, anal bleeding, blood in stool, constipation, diarrhea and rectal pain.  Endocrine: Negative for cold intolerance, heat intolerance, polydipsia, polyphagia and polyuria.  Genitourinary: Negative for decreased urine volume, difficulty urinating, discharge, dysuria, enuresis, flank pain, frequency, genital sores, hematuria, penile pain, penile swelling, scrotal swelling, testicular pain and urgency.  Musculoskeletal: Negative for arthralgias, back pain, gait problem, joint swelling, myalgias, neck pain and neck stiffness.  Skin: Negative for color change, pallor, rash and wound.  Allergic/Immunologic: Negative for environmental allergies, food allergies and immunocompromised state.  Neurological: Negative for dizziness, tremors, seizures, syncope, facial asymmetry, speech difficulty, weakness, light-headedness, numbness and headaches.  Hematological: Negative for adenopathy. Does not bruise/bleed easily.  Psychiatric/Behavioral: Negative for agitation, behavioral problems, confusion, decreased concentration, dysphoric mood, hallucinations, self-injury, sleep disturbance and suicidal ideas. The patient is not nervous/anxious and is not hyperactive.     Past Medical History:  Diagnosis Date  . Allergy    Flonase, Patanol, Zyrtec  . Arthritis    psoriasis  . Asthma    seasonal, with allergies  . Borderline diabetes   . Colon polyp 10/27/2011  . Depression    denies 11/11/16  . GERD (gastroesophageal reflux disease)   . History of kidney stones    x1  . Hyperlipidemia   . Hypertension   . Low back pain    "in kidney area-was told that it was related to gallstones"  . Psoriasis    [name]  . 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 APPENDECTOMY    . CHOLECYSTECTOMY N/A 12/16/2014   Procedure: LAPAROSCOPIC CHOLECYSTECTOMY WITH INTRAOPERATIVE CHOLANGIOGRAM;  Surgeon: [name], MD;  Location: [facility];  Service: General;  Laterality: N/A;  . COLONOSCOPY  10/27/2011   single polyp. Repeat 5 years.  Marland Kitchen POLYPECTOMY    . PROSTATE BIOPSY  10/2014   will have another in 3 months  . SEPTOPLASTY  2014  . TONSILLECTOMY  2014  . UVULOPALATOPHARYNGOPLASTY  2014   No Known Allergies Current Outpatient Medications on File Prior to Visit  Medication Sig Dispense Refill  . albuterol (PROAIR HFA) 108 (90 Base) MCG/ACT inhaler Inhale 2 puffs into the lungs every 4 (four) hours as needed for wheezing or shortness of breath. 1 Inhaler 1  . beclomethasone (QVAR) 80 MCG/ACT inhaler Inhale 2 puffs into the lungs 2 (two) times daily. 3 Inhaler 3  . blood glucose meter kit and supplies KIT Check sugar once daily Dx: glucose intolerance 1 each 0  . clobetasol ointment (TEMOVATE) 0.05 % APPLY EXTERNALLY TO THE AFFECTED AREA TWICE DAILY 30 g 0  . Cyanocobalamin (VITAMIN B-12 PO) Take by mouth.    . fluticasone (FLONASE) 50 MCG/ACT nasal spray SHAKE LIQUID AND USE 2 SPRAYS IN EACH NOSTRIL EVERY DAY 48 g 3  . ketoconazole (NIZORAL) 2 % shampoo APPLY EXTERNALLY 2 TIMES A WEEK. 120 mL 3  . montelukast (SINGULAIR) 10 MG tablet TAKE 1 TABLET(10 MG) BY MOUTH AT BEDTIME 90 tablet 3  . omeprazole (PRILOSEC) 40 MG capsule TAKE ONE CAPSULE BY MOUTH EVERY DAY 30 TO 60 MINUTES BEFORE EATING 90 capsule 3   No current facility-administered medications on file prior to visit.    Social History   Socioeconomic History  . Marital status: Divorced    Spouse name: Not on file  . Number of children: 1  . Years of education: Not on file  . Highest education level: Not on file  Occupational History  . Occupation: Product manager: [employer]    Comment: Pharmacist, community  Social Needs  .  Financial resource strain: Not on file  . Food insecurity:    Worry: Not on file    Inability: Not on file  . Transportation needs:    Medical: Not on file    Non-medical: Not on file  Tobacco Use  . Smoking status: Never Smoker  . Smokeless tobacco: Never Used  Substance and Sexual Activity  . Alcohol use: No    Alcohol/week: 0.0 oz  . Drug use: No  . Sexual activity: Yes    Comment: SSP  Lifestyle  . Physical activity:    Days per week: Not on file    Minutes per session: Not on file  . Stress: Not on file  Relationships  . Social connections:    Talks on phone: Not on file    Gets together: Not on file    Attends religious service: Not on file    Active member of club or organization: Not on file    Attends meetings of clubs or organizations: Not on file    Relationship status: Not on file  . Intimate partner violence:    Fear of current or ex partner: Not on file    Emotionally abused: Not on file    Physically abused: Not on file    Forced sexual activity: Not on file  Other Topics Concern  . Not on file  Social History Narrative  Original from [place]; moved to [place] 1995.   Marital status: same sexual partner 4 X years.   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        Tobacco: never      Alcohol: none      Drugs: none       Exercise: daily in 2018; 10,000 steps daily; lots of walking on campus at work      ADLs: independent with ADLs.        Sexual activity: dating males only in 2018; bisexual.  Previously married to male.              Family History  Problem Relation Age of Onset  . Colon cancer Neg Hx   . Esophageal cancer Neg Hx     . Stomach cancer Neg Hx   . Prostate cancer Neg Hx   . Diabetes Neg Hx   . CAD Neg Hx        Objective:    BP 116/82   Pulse 68   Temp 98 F (36.7 C) (Oral)   Resp 16   Ht 5' 7.32" (1.71 m)   Wt 180 lb (81.6 kg)   SpO2 98%   BMI 27.92 kg/m  Physical Exam  Constitutional: He is oriented to person, place, and time. He appears well-developed and well-nourished. No distress.  HENT:  Head: Normocephalic and atraumatic.  Right Ear: External ear normal.  Left Ear: External ear normal.  Nose: Nose normal.  Mouth/Throat: Oropharynx is clear and moist.  Eyes: Pupils are equal, round, and reactive to light. Conjunctivae and EOM are normal.  Neck: Normal range of motion. Neck supple. Carotid bruit is not present. No thyromegaly present.  Cardiovascular: Normal rate, regular rhythm, normal heart sounds and intact distal pulses. Exam reveals no gallop and no friction rub.  No murmur heard. Pulmonary/Chest: Effort normal and breath sounds normal. He has no wheezes. He has no rales.  Abdominal: Soft. Bowel sounds are normal. He exhibits no distension and no mass. There is no tenderness. There is no rebound and no guarding.  Musculoskeletal:       Right shoulder: Normal.       Left shoulder: Normal.       Cervical back: Normal.  Lymphadenopathy:    He has no cervical adenopathy.  Neurological: He is alert and oriented to person, place, and time. He has normal reflexes. No cranial nerve deficit. He exhibits normal muscle tone. Coordination normal.  Skin: Skin is warm and dry. No rash noted. He is not diaphoretic.  Psychiatric: He has a normal mood and affect. His behavior is normal. Judgment and thought content normal.  Nursing note and vitals reviewed.  No results found. Depression screen PHQ 2/9 04/30/2018 11/25/2017 11/25/2017 09/21/2017 08/09/2017  Decreased Interest 0 0 0 0 0  Down, Depressed, Hopeless 0 0 0 0 0  PHQ - 2 Score 0 0 0 0 0   Fall Risk  04/30/2018 11/25/2017 11/25/2017  09/21/2017 08/09/2017  Falls in the past year? No No No No No        Assessment & Plan:   1. Essential hypertension   2. OBSTRUCTIVE SLEEP APNEA   3. Glucose   intolerance (impaired glucose tolerance)   4. Pure hypercholesterolemia   5. Elevated PSA   6. Hypersomnolence   7. Dysuria   8. Stress reaction     Dysuria: New onset; send urine cx and PSA.  Followed by urology.  HTN: well controlled; obtain labs;continue current medications.  Hypersomnolence: New; onset; with acute stressors; obtain labs; refer to psychotherapy.  S/p surgical correction for OSA.  Glucose intolerance: stable; obtain labs; rx for glucometer provided.    Hypercholesterolemia: controlled; obtain labs; continue current medications.  Acute stress reaction: New; refer to psychotherapy.  History of elevated PSA: repeat today; followed by urology.  Orders Placed This Encounter  Procedures  . Urine Culture    Order Specific Question:   Source    Answer:   clean catch  . CBC with Differential/Platelet  . Comprehensive metabolic panel    Order Specific Question:   Has the patient fasted?    Answer:   No  . Lipid panel    Order Specific Question:   Has the patient fasted?    Answer:   No  . PSA  . Urinalysis, dipstick only  . Ambulatory referral to Psychology    Referral Priority:   Routine    Referral Type:   Psychiatric    Referral Reason:   Specialty Services Required    Requested Specialty:   Psychology    Number of Visits Requested:   1  . POCT glycosylated hemoglobin (Hb A1C)  . POCT glucose (manual entry)   Meds ordered this encounter  Medications  . glucose monitoring kit (FREESTYLE) monitoring kit    Sig: 1 each by Does not apply route as needed for other. Dx: GLUCOSE INTOLERANCE    Dispense:  1 each    Refill:  0  . glucose blood test strip    Sig: Check sugar once daily  Dx: Glucose Intolerance    Dispense:  100 each    Refill:  3  . Lancets MISC    Sig: Check sugar once daily;  Dx: Glucose Intolerance    Dispense:  100 each    Refill:  3  . fluticasone (FLOVENT HFA) 110 MCG/ACT inhaler    Sig: Inhale 2 puffs into the lungs 2 (two) times daily.    Dispense:  3 Inhaler    Refill:  4  . DISCONTD: hydrochlorothiazide (HYDRODIURIL) 12.5 MG tablet    Sig: TAKE 1 TABLET(12.5 MG) BY MOUTH DAILY    Dispense:  90 tablet    Refill:  1  . rosuvastatin (CRESTOR) 10 MG tablet    Sig: TAKE 1 TABLET(10 MG) BY MOUTH DAILY    Dispense:  90 tablet    Refill:  1    Return in about 6 months (around 10/31/2018) for follow-up chronic medical conditions [place].   Kristi Elayne Guerin, M.D. Primary Care at [facility] previously [facility] [place] [address] [place], [place]  [address] [phone] phone [phone] fax

## 2018-05-01 LAB — URINE CULTURE

## 2018-05-01 LAB — CBC WITH DIFFERENTIAL/PLATELET
BASOS ABS: 0 10*3/uL (ref 0.0–0.2)
Basos: 1 %
EOS (ABSOLUTE): 0.1 10*3/uL (ref 0.0–0.4)
Eos: 2 %
HEMATOCRIT: 48.9 % (ref 37.5–51.0)
HEMOGLOBIN: 16.1 g/dL (ref 13.0–17.7)
Immature Grans (Abs): 0 10*3/uL (ref 0.0–0.1)
Immature Granulocytes: 1 %
LYMPHS ABS: 1 10*3/uL (ref 0.7–3.1)
Lymphs: 25 %
MCH: 29.9 pg (ref 26.6–33.0)
MCHC: 32.9 g/dL (ref 31.5–35.7)
MCV: 91 fL (ref 79–97)
MONOCYTES: 11 %
MONOS ABS: 0.4 10*3/uL (ref 0.1–0.9)
NEUTROS ABS: 2.5 10*3/uL (ref 1.4–7.0)
Neutrophils: 60 %
Platelets: 236 10*3/uL (ref 150–379)
RBC: 5.38 x10E6/uL (ref 4.14–5.80)
RDW: 14 % (ref 12.3–15.4)
WBC: 4 10*3/uL (ref 3.4–10.8)

## 2018-05-01 LAB — URINALYSIS, DIPSTICK ONLY
BILIRUBIN UA: NEGATIVE
Glucose, UA: NEGATIVE
KETONES UA: NEGATIVE
Leukocytes, UA: NEGATIVE
Nitrite, UA: NEGATIVE
PH UA: 6 (ref 5.0–7.5)
RBC, UA: NEGATIVE
SPEC GRAV UA: 1.027 (ref 1.005–1.030)
Urobilinogen, Ur: 1 mg/dL (ref 0.2–1.0)

## 2018-05-01 LAB — COMPREHENSIVE METABOLIC PANEL
ALBUMIN: 4.3 g/dL (ref 3.5–5.5)
ALT: 26 IU/L (ref 0–44)
AST: 18 IU/L (ref 0–40)
Albumin/Globulin Ratio: 1.7 (ref 1.2–2.2)
Alkaline Phosphatase: 49 IU/L (ref 39–117)
BUN / CREAT RATIO: 12 (ref 9–20)
BUN: 13 mg/dL (ref 6–24)
Bilirubin Total: 0.6 mg/dL (ref 0.0–1.2)
CALCIUM: 9.1 mg/dL (ref 8.7–10.2)
CO2: 26 mmol/L (ref 20–29)
CREATININE: 1.05 mg/dL (ref 0.76–1.27)
Chloride: 101 mmol/L (ref 96–106)
GFR, EST AFRICAN AMERICAN: 91 mL/min/{1.73_m2} (ref >59)
GFR, EST NON AFRICAN AMERICAN: 79 mL/min/{1.73_m2} (ref >59)
GLOBULIN, TOTAL: 2.5 g/dL (ref 1.5–4.5)
Glucose: 100 mg/dL — ABNORMAL HIGH (ref 65–99)
Potassium: 3.9 mmol/L (ref 3.5–5.2)
SODIUM: 142 mmol/L (ref 134–144)
Total Protein: 6.8 g/dL (ref 6.0–8.5)

## 2018-05-01 LAB — LIPID PANEL
CHOL/HDL RATIO: 3.2 ratio (ref 0.0–5.0)
Cholesterol, Total: 121 mg/dL (ref 100–199)
HDL: 38 mg/dL — ABNORMAL LOW (ref >39)
LDL CALC: 49 mg/dL (ref 0–99)
Triglycerides: 168 mg/dL — ABNORMAL HIGH (ref 0–149)
VLDL Cholesterol Cal: 34 mg/dL (ref 5–40)

## 2018-05-01 LAB — PSA: Prostate Specific Ag, Serum: 5.5 ng/mL — ABNORMAL HIGH (ref 0.0–4.0)

## 2018-05-15 ENCOUNTER — Ambulatory Visit: Payer: BC Managed Care – PPO | Admitting: Psychology

## 2018-05-21 ENCOUNTER — Encounter: Payer: Self-pay | Admitting: Family Medicine

## 2018-05-24 ENCOUNTER — Other Ambulatory Visit: Payer: Self-pay | Admitting: Family Medicine

## 2018-05-28 ENCOUNTER — Ambulatory Visit: Payer: Self-pay | Admitting: Family Medicine

## 2018-05-30 ENCOUNTER — Other Ambulatory Visit: Payer: Self-pay | Admitting: Family Medicine

## 2018-06-11 ENCOUNTER — Other Ambulatory Visit: Payer: Self-pay | Admitting: Family Medicine

## 2018-06-13 ENCOUNTER — Telehealth: Payer: Self-pay | Admitting: *Deleted

## 2018-06-13 NOTE — Telephone Encounter (Signed)
PA STARTED   KEY:  [id number]

## 2018-07-01 ENCOUNTER — Other Ambulatory Visit: Payer: Self-pay | Admitting: Family Medicine

## 2018-07-14 MED ORDER — ROSUVASTATIN CALCIUM 10 MG PO TABS
ORAL_TABLET | ORAL | 1 refills | Status: DC
Start: 2018-07-14 — End: 2018-09-11

## 2018-08-23 ENCOUNTER — Other Ambulatory Visit: Payer: Self-pay | Admitting: Family Medicine

## 2018-08-23 ENCOUNTER — Other Ambulatory Visit: Payer: Self-pay | Admitting: *Deleted

## 2018-08-23 NOTE — Telephone Encounter (Signed)
Amlodipine 5 mg refill Last Refill:05/24/18 # 90 Last OV: 04/30/18 PCP: [facility]: Festus Barren [phone]  I do not see where this pt has been established or has an upcoming appt with another provider.

## 2018-09-02 ENCOUNTER — Other Ambulatory Visit: Payer: Self-pay | Admitting: Family Medicine

## 2018-09-03 MED ORDER — CLOBETASOL PROPIONATE 0.05 % EX OINT: TOPICAL_OINTMENT | CUTANEOUS | 0 refills | Status: DC

## 2018-09-03 NOTE — Telephone Encounter (Signed)
Patient called and asked is he still using the requested medications, he says he is and will need refills for his psoriasis. I asked is he staying with Dr. Tamala Julian at her new location or staying at [facility], he says he will stay at [facility] and establish with another provider, appointment scheduled for 11/01/18 at 1000 with Dr. Pamella Pert.  Unable to refill the below medication due to it was discontinued from profile on 05/31/18.  Fluocinolone 0.01% cream Last refill:04/30/18  Fluocinonide 0.05% cream refill Last refill:04/30/18 Last OV:04/30/18 DPO:EUMP Pharmacy: [place], [place] AT [facility] [place] [phone] (Phone) [phone] (Fax)

## 2018-09-05 MED ORDER — FLUOCINOLONE ACETONIDE 0.01 % EX CREA: TOPICAL_CREAM | CUTANEOUS | 1 refills | Status: DC

## 2018-09-05 MED ORDER — FLUOCINONIDE 0.05 % EX CREA: TOPICAL_CREAM | CUTANEOUS | 1 refills | Status: DC

## 2018-09-05 NOTE — Telephone Encounter (Signed)
refilled 

## 2018-09-11 ENCOUNTER — Other Ambulatory Visit: Payer: Self-pay | Admitting: *Deleted

## 2018-09-11 ENCOUNTER — Other Ambulatory Visit: Payer: Self-pay | Admitting: Family Medicine

## 2018-09-11 MED ORDER — ROSUVASTATIN CALCIUM 10 MG PO TABS: ORAL_TABLET | ORAL | 0 refills | Status: DC

## 2018-09-19 ENCOUNTER — Ambulatory Visit: Payer: Self-pay | Admitting: Family Medicine

## 2018-09-27 ENCOUNTER — Ambulatory Visit: Payer: BC Managed Care – PPO | Admitting: Emergency Medicine

## 2018-09-27 ENCOUNTER — Encounter: Payer: Self-pay | Admitting: Emergency Medicine

## 2018-09-27 VITALS — BP 113/78 | HR 64 | Temp 98.1°F | Resp 16 | Ht 67.0 in | Wt 182.3 lb

## 2018-09-27 DIAGNOSIS — R51 Headache: Secondary | ICD-10-CM | POA: Diagnosis not present

## 2018-09-27 DIAGNOSIS — R519 Headache, unspecified: Secondary | ICD-10-CM

## 2018-09-27 DIAGNOSIS — G4452 New daily persistent headache (NDPH): Secondary | ICD-10-CM

## 2018-09-27 MED ORDER — BUTALBITAL-ASPIRIN-CAFFEINE 50-325-40 MG PO CAPS: 1.0000 | ORAL_CAPSULE | Freq: Four times a day (QID) | ORAL | 0 refills | Status: DC | PRN

## 2018-09-27 NOTE — Progress Notes (Signed)
[facility] 57 y.o.   Chief Complaint  Patient presents with  . Migraine    x2 weeks; states the pain is in the middle/center of his forehead; takes medication and pain still there  . Excessive Sweating    while having headache    HISTORY OF PRESENT ILLNESS: This is a 57 y.o. male complaining of headache for the past 2 weeks.  Start with soreness in the mid forehead area and then it spreads the entire head.  Feels like pressure.  Takes aspirin or Tylenol or ibuprofen with some relief.  No photophobia.  Feels nauseous but no vomiting.  He had some sweating.  No teary eyes or runny nose.  No history of migraine headaches.  Non-smoker and non-EtOH abuser.  Initially had dizziness and some vertigo but that went away.  Denies syncope.  No other significant symptoms.  HPI   Prior to Admission medications   Medication Sig Start Date End Date Taking? Authorizing Provider  albuterol (PROAIR HFA) 108 (90 Base) MCG/ACT inhaler Inhale 2 puffs into the lungs every 4 (four) hours as needed for wheezing or shortness of breath. 11/25/17  Yes [name], MD  amLODipine (NORVASC) 5 MG tablet TAKE 1 TABLET(5 MG) BY MOUTH DAILY 08/23/18  Yes Rutherford Guys, MD  beclomethasone (QVAR) 80 MCG/ACT inhaler Inhale 2 puffs into the lungs 2 (two) times daily. 11/25/17  Yes [name], MD  blood glucose meter kit and supplies KIT Check sugar once daily Dx: glucose intolerance 08/09/17  Yes [name], MD  clobetasol ointment (TEMOVATE) 0.05 % APPLY EXTERNALLY TO THE AFFECTED AREA TWICE DAILY 09/03/18  Yes Rutherford Guys, MD  Cyanocobalamin (VITAMIN B-12 PO) Take by mouth.   Yes [provider]  fluocinolone (VANOS) 0.01 % cream APPLY EXTERNALLY TO THE AFFECTED AREA TWICE DAILY 09/05/18  Yes Rutherford Guys, MD  fluocinonide cream (LIDEX) 0.05 % ASAA TWICE DAILY 09/05/18  Yes Rutherford Guys, MD  fluticasone (FLONASE) 50 MCG/ACT nasal spray SHAKE LIQUID AND USE 2 SPRAYS IN EACH NOSTRIL EVERY DAY  11/25/17  Yes [name], MD  fluticasone (FLOVENT HFA) 110 MCG/ACT inhaler Inhale 2 puffs into the lungs 2 (two) times daily. 04/30/18  Yes [name], MD  glucose blood test strip Check sugar once daily  Dx: Glucose Intolerance 04/30/18  Yes [name], MD  glucose monitoring kit (FREESTYLE) monitoring kit 1 each by Does not apply route as needed for other. Dx: GLUCOSE INTOLERANCE 04/30/18  Yes [name], MD  hydrochlorothiazide (HYDRODIURIL) 12.5 MG tablet TAKE 1 TABLET(12.5 MG) BY MOUTH DAILY 07/01/18  Yes [name], MD  ketoconazole (NIZORAL) 2 % shampoo APPLY EXTERNALLY 2 TIMES A WEEK. 11/25/17  Yes [name], MD  Lancets MISC Check sugar once daily; Dx: Glucose Intolerance 04/30/18  Yes [name], MD  montelukast (SINGULAIR) 10 MG tablet TAKE 1 TABLET(10 MG) BY MOUTH AT BEDTIME 11/25/17  Yes [name], MD  omeprazole (PRILOSEC) 40 MG capsule TAKE ONE CAPSULE BY MOUTH EVERY DAY 30 TO 60 MINUTES BEFORE EATING 11/25/17  Yes [name], MD  rosuvastatin (CRESTOR) 10 MG tablet TAKE 1 TABLET(10 MG) BY MOUTH DAILY 09/11/18  Yes Rutherford Guys, MD  triamcinolone cream (KENALOG) 0.1 % Apply 1 application topically 2 (two) times daily.   Yes [provider]    No Known Allergies  Patient Active Problem List   Diagnosis Date Noted  . Glucose intolerance (impaired  glucose tolerance) 12/23/2014  . Hyperlipidemia 12/23/2014  . Psoriasis 06/27/2014  . Multiple lung nodules 12/09/2011  . PULMONARY HYPERTENSION 03/04/2010  . Essential hypertension 02/10/2010  . Allergic rhinitis 02/10/2010  . Asthma in adult 02/10/2010  . OBSTRUCTIVE SLEEP APNEA 02/04/2010  . TUBERCULOSIS, HX OF 02/04/2010    Past Medical History:  Diagnosis Date  . Allergy    Flonase, Patanol, Zyrtec  . Arthritis    psoriasis  . Asthma    seasonal, with allergies  . Borderline diabetes   . Colon polyp 10/27/2011  . Depression    denies 11/11/16  . GERD (gastroesophageal  reflux disease)   . History of kidney stones    x1  . Hyperlipidemia   . Hypertension   . Low back pain    "in kidney area-was told that it was related to gallstones"  . Psoriasis    [name]  . 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 APPENDECTOMY    . CHOLECYSTECTOMY N/A 12/16/2014   Procedure: LAPAROSCOPIC CHOLECYSTECTOMY WITH INTRAOPERATIVE CHOLANGIOGRAM;  Surgeon: [name], MD;  Location: [facility];  Service: General;  Laterality: N/A;  . COLONOSCOPY  10/27/2011   single polyp. Repeat 5 years.  Marland Kitchen POLYPECTOMY    . PROSTATE BIOPSY  10/2014   will have another in 3 months  . SEPTOPLASTY  2014  . TONSILLECTOMY  2014  . UVULOPALATOPHARYNGOPLASTY  2014    Social History   Socioeconomic History  . Marital status: Divorced    Spouse name: Not on file  . Number of children: 1  . Years of education: Not on file  . Highest education level: Not on file  Occupational History  . Occupation: Product manager: [employer]    Comment: Pharmacist, community  Social Needs  . Financial resource strain: Not on file  . Food insecurity:    Worry: Not on file    Inability: Not on file  . Transportation needs:    Medical: Not on file    Non-medical: Not on file  Tobacco Use  . Smoking status: Never Smoker  . Smokeless tobacco: Never Used  Substance and Sexual Activity  . Alcohol use: No    Alcohol/week: 0.0 standard drinks  . Drug use: No  . Sexual activity: Yes    Comment: SSP  Lifestyle  . Physical activity:    Days per week: Not on file    Minutes per session: Not on file  . Stress: Not on file  Relationships  . Social connections:    Talks on phone: Not on file    Gets together: Not on file    Attends religious service: Not on file    Active member of club or organization: Not on file    Attends meetings of clubs or organizations: Not on file    Relationship status: Not  on file  . Intimate partner violence:    Fear of current or ex partner: Not on file    Emotionally abused: Not on file    Physically abused: Not on file    Forced sexual activity: Not on file  Other Topics Concern  . Not on file  Social History Narrative   Original from [place]; moved to [place] 1995.   Marital status: same sexual partner 4 X years.   Children:  1 child/son (18) at 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        Tobacco: never      Alcohol: none      Drugs: none       Exercise: daily in 2018; 10,000 steps daily; lots of walking on campus at work      ADLs: independent with ADLs.        Sexual activity: dating males only in 2018; bisexual.  Previously married to male.               Family History  Problem Relation Age of Onset  . Colon cancer Neg Hx   . Esophageal cancer Neg Hx   . Stomach cancer Neg Hx   . Prostate cancer Neg Hx   . Diabetes Neg Hx   . CAD Neg Hx      Review of Systems  Constitutional: Negative.  Negative for chills and fever.  HENT: Negative.  Negative for sinus pain and sore throat.   Eyes: Negative.  Negative for blurred vision and double vision.  Respiratory: Negative.  Negative for cough and shortness of breath.   Cardiovascular: Negative.  Negative for chest pain and palpitations.  Gastrointestinal: Positive for nausea. Negative for abdominal pain, diarrhea and vomiting.  Genitourinary: Negative.  Negative for dysuria.  Musculoskeletal: Negative.  Negative for back pain, myalgias and neck pain.  Skin: Negative.  Negative for rash.  Neurological: Positive for dizziness and headaches.  Endo/Heme/Allergies: Negative.   All other systems reviewed  and are negative.   Vitals:   09/27/18 1129  BP: 113/78  Pulse: 64  Resp: 16  Temp: 98.1 F (36.7 C)  SpO2: 97%    Physical Exam  Constitutional: He is oriented to person, place, and time. He appears well-developed and well-nourished.  HENT:  Head: Normocephalic and atraumatic.  Nose: Nose normal.  Mouth/Throat: Oropharynx is clear and moist.  Eyes: Pupils are equal, round, and reactive to light. Conjunctivae and EOM are normal.  Neck: Normal range of motion. Neck supple. No JVD present.  Cardiovascular: Normal rate, regular rhythm and normal heart sounds.  Pulmonary/Chest: Effort normal and breath sounds normal.  Abdominal: Soft. Bowel sounds are normal. He exhibits no distension. There is no tenderness.  Musculoskeletal: Normal range of motion. He exhibits no edema.  Lymphadenopathy:    He has no cervical adenopathy.  Neurological: He is alert and oriented to person, place, and time. He displays normal reflexes. No cranial nerve deficit or sensory deficit. He exhibits normal muscle tone. Coordination normal.  Skin: Skin is warm and dry. Capillary refill takes less than 2 seconds. No rash noted.  Psychiatric: He has a normal mood and affect. His behavior is normal.  Vitals reviewed.  A total of 25 minutes was spent in the room with the patient, greater than 50% of which was in counseling/coordination of care regarding differential diagnosis, treatment, medications, need for diagnostic work-up and need for neurology follow-up.   ASSESSMENT & PLAN: Reyden was seen today for migraine and excessive sweating.  Diagnoses and all orders for this visit:  Acute nonintractable headache, unspecified headache type Comments: Migraine-like headache Orders: -     Ambulatory referral to Neurology -     butalbital-aspirin-caffeine [facility]) 50-325-40 MG capsule; Take 1 capsule by mouth every 6 (six) hours as  needed for headache. -     CBC with Differential/Platelet -     Comprehensive  metabolic panel -     Sedimentation Rate -     HIV Antibody (routine testing w rflx)  New daily persistent headache -     MR Brain W Wo Contrast; Future    Patient Instructions       If you have lab work done today you will be contacted with your lab results within the next 2 weeks.  If you have not heard from [place] then please contact us. The fastest way to get your results is to register for My Chart.   IF you received an x-ray today, you will receive an invoice from [facility]. Please contact [facility] at [phone] with questions or concerns regarding your invoice.   IF you received labwork today, you will receive an invoice from [facility]. Please contact [facility] at [phone] with questions or concerns regarding your invoice.   Our billing staff will not be able to assist you with questions regarding bills from these companies.  You will be contacted with the lab results as soon as they are available. The fastest way to get your results is to activate your My Chart account. Instructions are located on the last page of this paperwork. If you have not heard from [place] regarding the results in 2 weeks, please contact this office.      General Headache Without Cause A headache is pain or discomfort felt around the head or neck area. There are many causes and types of headaches. In some cases, the cause may not be found. Follow these instructions at home: Managing pain  Take over-the-counter and prescription medicines only as told by your doctor.  Lie down in a dark, quiet room when you have a headache.  If directed, apply ice to the head and neck area: ? Put ice in a plastic bag. ? Place a towel between your skin and the bag. ? Leave the ice on for 20 minutes, 2-3 times per day.  Use a heating pad or hot shower to apply heat to the head and neck area as told by your doctor.  Keep lights dim if bright lights bother you or make your headaches  worse. Eating and drinking  Eat meals on a regular schedule.  Lessen how much alcohol you drink.  Lessen how much caffeine you drink, or stop drinking caffeine. General instructions  Keep all follow-up visits as told by your doctor. This is important.  Keep a journal to find out if certain things bring on headaches. For example, write down: ? What you eat and drink. ? How much sleep you get. ? Any change to your diet or medicines.  Relax by getting a massage or doing other relaxing activities.  Lessen stress.  Sit up straight. Do not tighten (tense) your muscles.  Do not use tobacco products. This includes cigarettes, chewing tobacco, or e-cigarettes. If you need help quitting, ask your doctor.  Exercise regularly as told by your doctor.  Get enough sleep. This often means 7-9 hours of sleep. Contact a doctor if:  Your symptoms are not helped by medicine.  You have a headache that feels different than the other headaches.  You feel sick to your stomach (nauseous) or you throw up (vomit).  You have a fever. Get help right away if:  Your headache becomes really bad.  You keep throwing up.  You have a stiff neck.  You have trouble  seeing.  You have trouble speaking.  You have pain in the eye or ear.  Your muscles are weak or you lose muscle control.  You lose your balance or have trouble walking.  You feel like you will pass out (faint) or you pass out.  You have confusion. This information is not intended to replace advice given to you by your health care provider. Make sure you discuss any questions you have with your health care provider. Document Released: 09/20/2008 Document Revised: 05/19/2016 Document Reviewed: 04/06/2015 Elsevier Interactive Patient Education  2018 [employer].      Agustina Caroli, MD [facility]

## 2018-09-27 NOTE — Patient Instructions (Addendum)
If you have lab work done today you will be contacted with your lab results within the next 2 weeks.  If you have not heard from [place] then please contact us. The fastest way to get your results is to register for My Chart.   IF you received an x-ray today, you will receive an invoice from [facility]. Please contact [facility] at [phone] with questions or concerns regarding your invoice.   IF you received labwork today, you will receive an invoice from [facility]. Please contact [facility] at [phone] with questions or concerns regarding your invoice.   Our billing staff will not be able to assist you with questions regarding bills from these companies.  You will be contacted with the lab results as soon as they are available. The fastest way to get your results is to activate your My Chart account. Instructions are located on the last page of this paperwork. If you have not heard from [place] regarding the results in 2 weeks, please contact this office.      General Headache Without Cause A headache is pain or discomfort felt around the head or neck area. There are many causes and types of headaches. In some cases, the cause may not be found. Follow these instructions at home: Managing pain  Take over-the-counter and prescription medicines only as told by your doctor.  Lie down in a dark, quiet room when you have a headache.  If directed, apply ice to the head and neck area: ? Put ice in a plastic bag. ? Place a towel between your skin and the bag. ? Leave the ice on for 20 minutes, 2-3 times per day.  Use a heating pad or hot shower to apply heat to the head and neck area as told by your doctor.  Keep lights dim if bright lights bother you or make your headaches worse. Eating and drinking  Eat meals on a regular schedule.  Lessen how much alcohol you drink.  Lessen how much caffeine you drink, or stop drinking caffeine. General instructions  Keep all  follow-up visits as told by your doctor. This is important.  Keep a journal to find out if certain things bring on headaches. For example, write down: ? What you eat and drink. ? How much sleep you get. ? Any change to your diet or medicines.  Relax by getting a massage or doing other relaxing activities.  Lessen stress.  Sit up straight. Do not tighten (tense) your muscles.  Do not use tobacco products. This includes cigarettes, chewing tobacco, or e-cigarettes. If you need help quitting, ask your doctor.  Exercise regularly as told by your doctor.  Get enough sleep. This often means 7-9 hours of sleep. Contact a doctor if:  Your symptoms are not helped by medicine.  You have a headache that feels different than the other headaches.  You feel sick to your stomach (nauseous) or you throw up (vomit).  You have a fever. Get help right away if:  Your headache becomes really bad.  You keep throwing up.  You have a stiff neck.  You have trouble seeing.  You have trouble speaking.  You have pain in the eye or ear.  Your muscles are weak or you lose muscle control.  You lose your balance or have trouble walking.  You feel like you will pass out (faint) or you pass out.  You have confusion. This information is not intended to replace advice given to you  by your health care provider. Make sure you discuss any questions you have with your health care provider. Document Released: 09/20/2008 Document Revised: 05/19/2016 Document Reviewed: 04/06/2015 Elsevier Interactive Patient Education  [employer].

## 2018-09-28 LAB — HIV ANTIBODY (ROUTINE TESTING W REFLEX): HIV SCREEN 4TH GENERATION: NONREACTIVE

## 2018-09-28 LAB — CBC WITH DIFFERENTIAL/PLATELET
BASOS: 1 %
Basophils Absolute: 0 10*3/uL (ref 0.0–0.2)
EOS (ABSOLUTE): 0 10*3/uL (ref 0.0–0.4)
EOS: 1 %
HEMATOCRIT: 49.3 % (ref 37.5–51.0)
Hemoglobin: 17.2 g/dL (ref 13.0–17.7)
IMMATURE GRANULOCYTES: 1 %
Immature Grans (Abs): 0 10*3/uL (ref 0.0–0.1)
LYMPHS ABS: 0.7 10*3/uL (ref 0.7–3.1)
Lymphs: 22 %
MCH: 30.5 pg (ref 26.6–33.0)
MCHC: 34.9 g/dL (ref 31.5–35.7)
MCV: 87 fL (ref 79–97)
MONOS ABS: 0.5 10*3/uL (ref 0.1–0.9)
Monocytes: 15 %
Neutrophils Absolute: 1.9 10*3/uL (ref 1.4–7.0)
Neutrophils: 60 %
Platelets: 197 10*3/uL (ref 150–450)
RBC: 5.64 x10E6/uL (ref 4.14–5.80)
RDW: 13.1 % (ref 12.3–15.4)
WBC: 3.1 10*3/uL — ABNORMAL LOW (ref 3.4–10.8)

## 2018-09-28 LAB — COMPREHENSIVE METABOLIC PANEL
A/G RATIO: 1.5 (ref 1.2–2.2)
ALBUMIN: 4.3 g/dL (ref 3.5–5.5)
ALT: 56 IU/L — ABNORMAL HIGH (ref 0–44)
AST: 51 IU/L — AB (ref 0–40)
Alkaline Phosphatase: 50 IU/L (ref 39–117)
BUN/Creatinine Ratio: 13 (ref 9–20)
BUN: 14 mg/dL (ref 6–24)
Bilirubin Total: 0.5 mg/dL (ref 0.0–1.2)
CALCIUM: 9 mg/dL (ref 8.7–10.2)
CO2: 22 mmol/L (ref 20–29)
Chloride: 103 mmol/L (ref 96–106)
Creatinine, Ser: 1.04 mg/dL (ref 0.76–1.27)
GFR calc Af Amer: 92 mL/min/{1.73_m2} (ref >59)
GFR, EST NON AFRICAN AMERICAN: 79 mL/min/{1.73_m2} (ref >59)
GLOBULIN, TOTAL: 2.8 g/dL (ref 1.5–4.5)
Glucose: 106 mg/dL — ABNORMAL HIGH (ref 65–99)
POTASSIUM: 3.4 mmol/L — AB (ref 3.5–5.2)
SODIUM: 145 mmol/L — AB (ref 134–144)
Total Protein: 7.1 g/dL (ref 6.0–8.5)

## 2018-09-28 LAB — SEDIMENTATION RATE: Sed Rate: 8 mm/hr (ref 0–30)

## 2018-10-01 ENCOUNTER — Encounter: Payer: Self-pay | Admitting: *Deleted

## 2018-11-01 ENCOUNTER — Ambulatory Visit: Payer: BC Managed Care – PPO | Admitting: Family Medicine

## 2018-11-01 ENCOUNTER — Encounter: Payer: Self-pay | Admitting: Family Medicine

## 2018-11-01 ENCOUNTER — Other Ambulatory Visit: Payer: Self-pay

## 2018-11-01 VITALS — BP 121/78 | HR 65 | Temp 98.0°F | Ht 67.0 in | Wt 187.0 lb

## 2018-11-01 DIAGNOSIS — R972 Elevated prostate specific antigen [PSA]: Secondary | ICD-10-CM

## 2018-11-01 DIAGNOSIS — R7303 Prediabetes: Secondary | ICD-10-CM

## 2018-11-01 DIAGNOSIS — I10 Essential (primary) hypertension: Secondary | ICD-10-CM

## 2018-11-01 DIAGNOSIS — Z23 Encounter for immunization: Secondary | ICD-10-CM

## 2018-11-01 MED ORDER — FREESTYLE LIBRE READER DEVI: 1.0000 | 10 refills | Status: DC

## 2018-11-01 MED ORDER — FREESTYLE LIBRE SENSOR SYSTEM MISC: 1.0000 | 10 refills | Status: DC

## 2018-11-01 NOTE — Progress Notes (Signed)
11/7/201910:45 AM  Rickey Cruz 10/11/1961, 57 y.o. male [id number]  Chief Complaint  Patient presents with  . Transitions Of Care    has eye appt for this month for dm eye exam and [facility] on the 27th of this month    HPI:   Patient is a 56 y.o. male with past medical history significant for prediabetes, HTN, HLP, elevated PSA, OSA, GERD, asthma, seasonal allergies who presents today to establish care  Previous PCP Dr Tamala Julian Last OV with her May 2019  Feels he is overall doing well Has no acute concerns today Scheduled with urologist nov 27th Continues to struggle with dietary changes Does exercise, walk regularly Does not check cbgs at home, requesting prescription for libre Not fasting today  Lab Results  Component Value Date   HGBA1C 5.8 04/30/2018   HGBA1C 5.9 (H) 11/25/2017   HGBA1C 5.9 (H) 08/09/2017   Lab Results  Component Value Date   MICROALBUR 1.7 05/19/2016   LDLCALC 49 04/30/2018   CREATININE 1.04 09/27/2018    Fall Risk  11/01/2018 04/30/2018 11/25/2017 11/25/2017 09/21/2017  Falls in the past year? 0 No No No No     Depression screen [facility] 2/9 04/30/2018 11/25/2017 11/25/2017  Decreased Interest 0 0 0  Down, Depressed, Hopeless 0 0 0  PHQ - 2 Score 0 0 0    No Known Allergies  Prior to Admission medications   Medication Sig Start Date End Date Taking? Authorizing Provider  albuterol (PROAIR HFA) 108 (90 Base) MCG/ACT inhaler Inhale 2 puffs into the lungs every 4 (four) hours as needed for wheezing or shortness of breath. 11/25/17  Yes [name], MD  amLODipine (NORVASC) 5 MG tablet TAKE 1 TABLET(5 MG) BY MOUTH DAILY 08/23/18  Yes Rutherford Guys, MD  beclomethasone (QVAR) 80 MCG/ACT inhaler Inhale 2 puffs into the lungs 2 (two) times daily. 11/25/17  Yes [name], MD  blood glucose meter kit and supplies KIT Check sugar once daily Dx: glucose intolerance 08/09/17  Yes [name], MD  butalbital-aspirin-caffeine [facility]) [phone] MG  capsule Take 1 capsule by mouth every 6 (six) hours as needed for headache. 09/27/18  Yes Sagardia, Ines Bloomer, MD  clobetasol ointment (TEMOVATE) 0.05 % APPLY EXTERNALLY TO THE AFFECTED AREA TWICE DAILY 09/03/18  Yes Rutherford Guys, MD  Cyanocobalamin (VITAMIN B-12 PO) Take by mouth.   Yes [provider]  fluocinolone (VANOS) 0.01 % cream APPLY EXTERNALLY TO THE AFFECTED AREA TWICE DAILY 09/05/18  Yes Rutherford Guys, MD  fluocinonide cream (LIDEX) 0.05 % ASAA TWICE DAILY 09/05/18  Yes Rutherford Guys, MD  fluticasone (FLONASE) 50 MCG/ACT nasal spray SHAKE LIQUID AND USE 2 SPRAYS IN EACH NOSTRIL EVERY DAY 11/25/17  Yes [name], MD  fluticasone (FLOVENT HFA) 110 MCG/ACT inhaler Inhale 2 puffs into the lungs 2 (two) times daily. 04/30/18  Yes [name], MD  glucose blood test strip Check sugar once daily  Dx: Glucose Intolerance 04/30/18  Yes [name], MD  glucose monitoring kit (FREESTYLE) monitoring kit 1 each by Does not apply route as needed for other. Dx: GLUCOSE INTOLERANCE 04/30/18  Yes [name], MD  hydrochlorothiazide (HYDRODIURIL) 12.5 MG tablet TAKE 1 TABLET(12.5 MG) BY MOUTH DAILY 07/01/18  Yes [name], MD  ketoconazole (NIZORAL) 2 % shampoo APPLY EXTERNALLY 2 TIMES A WEEK. 11/25/17  Yes [name], MD  Lancets MISC Check sugar once daily; Dx: Glucose Intolerance 04/30/18  Yes Tamala Julian,  Renette Butters, MD  montelukast (SINGULAIR) 10 MG tablet TAKE 1 TABLET(10 MG) BY MOUTH AT BEDTIME 11/25/17  Yes [name], MD  omeprazole (PRILOSEC) 40 MG capsule TAKE ONE CAPSULE BY MOUTH EVERY DAY 30 TO 60 MINUTES BEFORE EATING 11/25/17  Yes [name], MD  rosuvastatin (CRESTOR) 10 MG tablet TAKE 1 TABLET(10 MG) BY MOUTH DAILY 09/11/18  Yes Rutherford Guys, MD  triamcinolone cream (KENALOG) 0.1 % Apply 1 application topically 2 (two) times daily.   Yes [provider]    Past Medical History:  Diagnosis Date  . Allergy    Flonase, Patanol, Zyrtec  .  Arthritis    psoriasis  . Asthma    seasonal, with allergies  . Borderline diabetes   . Colon polyp 10/27/2011  . Depression    denies 11/11/16  . GERD (gastroesophageal reflux disease)   . History of kidney stones    x1  . Hyperlipidemia   . Hypertension   . Low back pain    "in kidney area-was told that it was related to gallstones"  . Psoriasis    [name]  . 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 APPENDECTOMY    . CHOLECYSTECTOMY N/A 12/16/2014   Procedure: LAPAROSCOPIC CHOLECYSTECTOMY WITH INTRAOPERATIVE CHOLANGIOGRAM;  Surgeon: [name], MD;  Location: [facility];  Service: General;  Laterality: N/A;  . COLONOSCOPY  10/27/2011   single polyp. Repeat 5 years.  Marland Kitchen POLYPECTOMY    . PROSTATE BIOPSY  10/2014   will have another in 3 months  . SEPTOPLASTY  2014  . TONSILLECTOMY  2014  . UVULOPALATOPHARYNGOPLASTY  2014    Social History   Tobacco Use  . Smoking status: Never Smoker  . Smokeless tobacco: Never Used  Substance Use Topics  . Alcohol use: No    Alcohol/week: 0.0 standard drinks    Family History  Problem Relation Age of Onset  . Colon cancer Neg Hx   . Esophageal cancer Neg Hx   . Stomach cancer Neg Hx   . Prostate cancer Neg Hx   . Diabetes Neg Hx   . CAD Neg Hx     Review of Systems  Constitutional: Negative for chills and fever.  Respiratory: Negative for cough and shortness of breath.   Cardiovascular: Negative for chest pain, palpitations and leg swelling.  Gastrointestinal: Negative for abdominal pain, nausea and vomiting.     OBJECTIVE:  Blood pressure 121/78, pulse 65, temperature 98 F (36.7 C), temperature source Oral, height _0  (1.702 m), weight 187 lb (84.8 kg), SpO2 95 %. Body mass index is 29.29 kg/m.   Wt Readings from Last 3 Encounters:  11/01/18 187 lb (84.8 kg)  09/27/18 182 lb 4.8 oz (82.7 kg)  04/30/18 180 lb (81.6 kg)       Physical Exam  Constitutional: He is oriented to person, place, and time. He appears well-developed and well-nourished.  HENT:  Head: Normocephalic and atraumatic.  Mouth/Throat: Oropharynx is clear and moist.  Eyes: Pupils are equal, round, and reactive to light. Conjunctivae and EOM are normal.  Neck: Neck supple.  Cardiovascular: Normal rate and regular rhythm. Exam reveals no gallop and no friction rub.  No murmur heard. Pulmonary/Chest: Effort normal and breath sounds normal. He has no wheezes. He has no rales.  Musculoskeletal: He exhibits no edema.  Neurological: He is alert and oriented to person, place, and time.  Skin: Skin is warm and dry.  Psychiatric: He has a normal mood and affect.  Nursing note and vitals reviewed.   ASSESSMENT and PLAN  1. Prediabetes Discussed importance of low carb diet, regular exercise and healthy weight.  - Hemoglobin A1c - TSH - Urinalysis, dipstick only  2. Need for prophylactic vaccination and inoculation against influenza - Flu Vaccine QUAD 36+ mos IM  3. Essential hypertension Controlled. Continue current regime.  - Basic Metabolic Panel - TSH - Urinalysis, dipstick only  4. Elevated PSA Has upcoming appt with urology - PSA    Return in about 6 months (around 05/02/2019).    Rutherford Guys, MD Primary Care at [place], [place] [address] Ph.  [phone] Fax [phone]

## 2018-11-01 NOTE — Patient Instructions (Signed)
° ° ° °  If you have lab work done today you will be contacted with your lab results within the next 2 weeks.  If you have not heard from us then please contact us. The fastest way to get your results is to register for My Chart. ° ° °IF you received an x-ray today, you will receive an invoice from [facility]. Please contact [facility] at [phone] with questions or concerns regarding your invoice.  ° °IF you received labwork today, you will receive an invoice from [facility]. Please contact [facility] at [phone] with questions or concerns regarding your invoice.  ° °Our billing staff will not be able to assist you with questions regarding bills from these companies. ° °You will be contacted with the lab results as soon as they are available. The fastest way to get your results is to activate your My Chart account. Instructions are located on the last page of this paperwork. If you have not heard from us regarding the results in 2 weeks, please contact this office. °  ° ° ° °

## 2018-11-02 ENCOUNTER — Encounter: Payer: Self-pay | Admitting: Family Medicine

## 2018-11-02 LAB — URINALYSIS, DIPSTICK ONLY
Bilirubin, UA: NEGATIVE
Glucose, UA: NEGATIVE
Ketones, UA: NEGATIVE
Leukocytes, UA: NEGATIVE
Nitrite, UA: NEGATIVE
Protein, UA: NEGATIVE
RBC, UA: NEGATIVE
Specific Gravity, UA: 1.024 (ref 1.005–1.030)
Urobilinogen, Ur: 1 mg/dL (ref 0.2–1.0)
pH, UA: 7 (ref 5.0–7.5)

## 2018-11-02 LAB — BASIC METABOLIC PANEL
BUN/Creatinine Ratio: 18 (ref 9–20)
BUN: 17 mg/dL (ref 6–24)
CO2: 22 mmol/L (ref 20–29)
Calcium: 9 mg/dL (ref 8.7–10.2)
Chloride: 104 mmol/L (ref 96–106)
Creatinine, Ser: 0.97 mg/dL (ref 0.76–1.27)
GFR calc Af Amer: 100 mL/min/{1.73_m2} (ref >59)
GFR calc non Af Amer: 86 mL/min/{1.73_m2} (ref >59)
Glucose: 160 mg/dL — ABNORMAL HIGH (ref 65–99)
Potassium: 3.7 mmol/L (ref 3.5–5.2)
Sodium: 142 mmol/L (ref 134–144)

## 2018-11-02 LAB — HEMOGLOBIN A1C
Est. average glucose Bld gHb Est-mCnc: 123 mg/dL
Hgb A1c MFr Bld: 5.9 % — ABNORMAL HIGH (ref 4.8–5.6)

## 2018-11-02 LAB — TSH: TSH: 2.45 u[IU]/mL (ref 0.450–4.500)

## 2018-11-02 LAB — PSA: Prostate Specific Ag, Serum: 5.2 ng/mL — ABNORMAL HIGH (ref 0.0–4.0)

## 2018-11-21 ENCOUNTER — Encounter

## 2018-11-21 ENCOUNTER — Ambulatory Visit: Payer: BC Managed Care – PPO | Admitting: Neurology

## 2018-11-21 ENCOUNTER — Encounter: Payer: Self-pay | Admitting: Neurology

## 2018-11-21 VITALS — BP 132/90 | HR 68 | Ht 65.0 in | Wt 186.0 lb

## 2018-11-21 DIAGNOSIS — E538 Deficiency of other specified B group vitamins: Secondary | ICD-10-CM | POA: Diagnosis not present

## 2018-11-21 DIAGNOSIS — I609 Nontraumatic subarachnoid hemorrhage, unspecified: Secondary | ICD-10-CM

## 2018-11-21 DIAGNOSIS — G44009 Cluster headache syndrome, unspecified, not intractable: Secondary | ICD-10-CM | POA: Insufficient documentation

## 2018-11-21 DIAGNOSIS — G4453 Primary thunderclap headache: Secondary | ICD-10-CM | POA: Diagnosis not present

## 2018-11-21 DIAGNOSIS — G44011 Episodic cluster headache, intractable: Secondary | ICD-10-CM | POA: Diagnosis not present

## 2018-11-21 DIAGNOSIS — I671 Cerebral aneurysm, nonruptured: Secondary | ICD-10-CM

## 2018-11-21 MED ORDER — SUMATRIPTAN SUCCINATE 3 MG/0.5ML ~~LOC~~ SOAJ: 3.0000 mg | Freq: Once | SUBCUTANEOUS | 0 refills | Status: DC | PRN

## 2018-11-21 MED ORDER — PREDNISONE 20 MG PO TABS: ORAL_TABLET | ORAL | 3 refills | Status: DC

## 2018-11-21 NOTE — Progress Notes (Signed)
[facility]    Provider:  Dr Jaynee Eagles Referring Provider: Horald Pollen, * Primary Care Physician:  Grant Fontana  CC:  headache  HPI:  Rickey Cruz is a 57 y.o. male here as requested by Dr. Mitchel Honour for headache. PMHx HTN, HLD, prediabetes. He has had 2 severe headaches. First started a year ago. In October this year he had more headaches "out of the blue". He measured his blood pressure. He was eating well. It woke him up at 3am. The headaches are frontal. Lasts 1-2 hours. 3-4 ibuprofen helped. He had soreness afterwards. It went on for a week then stopped. It would always wake hi in th middle of the night and sometimes during the day. It is severe sharp, he doesn't remember if he had autonomic symptoms. No FHx of cluster headaches.  The headache cycle lasted for 2 weeks and then improved.  The headaches often woke him up in the middle of the night.  Headaches were extremely severe.  Unclear triggers.  No other migrainous symptoms.  No other focal neurologic deficits, associated symptoms, inciting events or modifiable factors.  His son may have migraines.  Reviewed notes, labs and imaging from outside physicians, which showed:  Reviewed Dr. Barry Brunner notes.  This is a 57 year old male who was seen for headaches for 2 weeks.  Started with soreness in the mid forehead area and then it spread to the entire head feels like pressure.  Takes aspirin or Tylenol ibuprofen with some relief.  No photophobia.  Feels nauseated but no vomiting.  He has some sweating.  No teary eyes or runny nose.  No history of migraine headaches.  Non-smoker no alcohol use.  Initially had some dizziness and some vertigo but that went away.  Denied syncopal event.  He was given Fiorinal at onset.  Reviewed CBC C and CMP: Elevated glucose otherwise BUN and creatinine normal.  Sed rate normal.  HIV negative.  Review of Systems: Patient complains of symptoms per HPI as well as the following symptoms:  headache, difficulty with speech and memory. Pertinent negatives and positives per HPI. All others negative.   Social History   Socioeconomic History  . Marital status: Divorced    Spouse name: Not on file  . Number of children: 1  . Years of education: Not on file  . Highest education level: Doctorate  Occupational History  . Occupation: Product manager: [employer]    Comment: Pharmacist, community  Social Needs  . Financial resource strain: Not on file  . Food insecurity:    Worry: Not on file    Inability: Not on file  . Transportation needs:    Medical: Not on file    Non-medical: Not on file  Tobacco Use  . Smoking status: Never Smoker  . Smokeless tobacco: Never Used  Substance and Sexual Activity  . Alcohol use: Never    Alcohol/week: 0.0 standard drinks    Frequency: Never  . Drug use: Never  . Sexual activity: Yes    Comment: SSP  Lifestyle  . Physical activity:    Days per week: Not on file    Minutes per session: Not on file  . Stress: Not on file  Relationships  . Social connections:    Talks on phone: Not on file    Gets together: Not on file    Attends religious service: Not on file    Active member of club or organization: Not on file  Attends meetings of clubs or organizations: Not on file    Relationship status: Not on file  . Intimate partner violence:    Fear of current or ex partner: Not on file    Emotionally abused: Not on file    Physically abused: Not on file    Forced sexual activity: Not on file  Other Topics Concern  . Not on file  Social History Narrative   Original from [place]; moved to [place] 1995.   Marital status: same sexual partner 4 X years.   Children:  1 child/son (18) at 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        Tobacco: never      Alcohol: none      Drugs: none       Exercise: daily in 2018; 10,000 steps daily; lots of walking on campus at work      ADLs: independent with ADLs.        Sexual activity: dating males only in 2018; bisexual.  Previously married to male.       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          Family History  Problem Relation Age of Onset  . Colon cancer Neg Hx   . Esophageal cancer Neg Hx   . Stomach cancer Neg Hx   . Prostate cancer Neg Hx   . Diabetes Neg Hx   . CAD Neg Hx   . Migraines Neg Hx   . Headache Neg Hx     Past Medical History:  Diagnosis Date  . Allergy    Flonase, Patanol, Zyrtec  . Arthritis    psoriasis  . Asthma    seasonal, with allergies  . Borderline diabetes   . Colon polyp 10/27/2011  . Depression    denies 11/11/16; denies 11/21/18 "been years"  . GERD (gastroesophageal reflux disease)   . History of kidney stones    x1  . Hyperlipidemia   . Hypertension   . Low back pain    "in kidney area-was told that it was related to gallstones"  . Prediabetes   . Psoriasis    [name]  . Severe sleep apnea    h/o; had procedure and weight control, does not have to wear CPAP  . 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 APPENDECTOMY    . CHOLECYSTECTOMY N/A 12/16/2014   Procedure: LAPAROSCOPIC CHOLECYSTECTOMY WITH INTRAOPERATIVE CHOLANGIOGRAM;  Surgeon: [name], MD;  Location: [facility];  Service: General;  Laterality: N/A;  . COLONOSCOPY  10/27/2011   single polyp. Repeat 5 years.  Marland Kitchen POLYPECTOMY    . PROSTATE BIOPSY  10/2014   will have another in 3 months  . SEPTOPLASTY  2014  . TONSILLECTOMY  2014  . UVULOPALATOPHARYNGOPLASTY   2014    Current Outpatient Medications  Medication Sig Dispense Refill  . albuterol (PROAIR HFA) 108 (90 Base) MCG/ACT inhaler Inhale 2 puffs into the lungs every 4 (four) hours as needed for wheezing or shortness of breath. 1 Inhaler 1  .  amLODipine (NORVASC) 5 MG tablet TAKE 1 TABLET(5 MG) BY MOUTH DAILY 90 tablet 0  . beclomethasone (QVAR) 80 MCG/ACT inhaler Inhale 2 puffs into the lungs 2 (two) times daily. 3 Inhaler 3  . Continuous Blood Gluc Receiver (FREESTYLE LIBRE READER) DEVI 1 each by Does not apply route every 14 (fourteen) days. 1 Device 10  . Continuous Blood Gluc Sensor (FREESTYLE LIBRE SENSOR SYSTEM) MISC 1 each by Does not apply route every 14 (fourteen) days. 1 each 10  . fluocinonide cream (LIDEX) 0.05 % ASAA TWICE DAILY 30 g 1  . fluticasone (FLONASE) 50 MCG/ACT nasal spray SHAKE LIQUID AND USE 2 SPRAYS IN EACH NOSTRIL EVERY DAY 48 g 3  . fluticasone (FLOVENT HFA) 110 MCG/ACT inhaler Inhale 2 puffs into the lungs 2 (two) times daily. 3 Inhaler 4  . hydrochlorothiazide (HYDRODIURIL) 12.5 MG tablet TAKE 1 TABLET(12.5 MG) BY MOUTH DAILY 90 tablet 0  . ketoconazole (NIZORAL) 2 % shampoo APPLY EXTERNALLY 2 TIMES A WEEK. 120 mL 3  . montelukast (SINGULAIR) 10 MG tablet TAKE 1 TABLET(10 MG) BY MOUTH AT BEDTIME 90 tablet 3  . omeprazole (PRILOSEC) 40 MG capsule TAKE ONE CAPSULE BY MOUTH EVERY DAY 30 TO 60 MINUTES BEFORE EATING 90 capsule 3  . rosuvastatin (CRESTOR) 10 MG tablet TAKE 1 TABLET(10 MG) BY MOUTH DAILY 90 tablet 0  . triamcinolone cream (KENALOG) 0.1 % Apply 1 application topically 2 (two) times daily.     No current facility-administered medications for this visit.     Allergies as of 11/21/2018  . (No Known Allergies)    Vitals: BP 132/90 (BP Location: Right Arm, Patient Position: Sitting)   Pulse 68   Ht 5\' 5"  (1.651 m)   Wt 186 lb (84.4 kg)   BMI 30.95 kg/m  Last Weight:  Wt Readings from Last 1 Encounters:  11/21/18 186 lb (84.4 kg)   Last Height:     Ht Readings from Last 1 Encounters:  11/21/18 5\' 5"  (1.651 m)   Physical exam: Exam: Gen: NAD, conversant, well nourised, obese, well groomed                     CV: RRR, no MRG. No Carotid Bruits. No peripheral edema, warm, nontender Eyes: Conjunctivae clear without exudates or hemorrhage  Neuro: Detailed Neurologic Exam  Speech:    Speech is normal; fluent and spontaneous with normal comprehension.  Cognition:    The patient is oriented to person, place, and time;     recent and remote memory intact;     language fluent;     normal attention, concentration,     fund of knowledge Cranial Nerves:    The pupils are equal, round, and reactive to light. The fundi are normal and spontaneous venous pulsations are present. Visual fields are full to finger confrontation. Extraocular movements are intact. Trigeminal sensation is intact and the muscles of mastication are normal. The face is symmetric. The palate elevates in the midline. Hearing intact. Voice is normal. Shoulder shrug is normal. The tongue has normal motion without fasciculations.   Coordination:    Normal finger to nose and heel to shin. Normal rapid alternating movements.   Gait:    Heel-toe and tandem gait are normal.   Motor Observation:    No asymmetry, no atrophy, and no involuntary movements noted. Tone:    Normal muscle tone.    Posture:    Posture is normal. normal erect    Strength:  Strength is V/V in the upper and lower limbs.      Sensation: intact to LT     Reflex Exam:  DTR's:    Deep tendon reflexes in the upper and lower extremities are normal bilaterally.   Toes:    The toes are downgoing bilaterally.   Clonus:    Clonus is absent.    Assessment/Plan:   56 y.o. male here as requested by Dr. Mitchel Honour for headache. PMHx HTN, HLD, prediabetes. Headache was acute, worst of life, woke up with it in maximum pain, likely cluster headache but need to evaluate for thunderclap headache. Also will  check b12 due to memory changes which are likely normal cognitive aging.  Orders Placed This Encounter  Procedures  . MR BRAIN W WO CONTRAST  . MR MRA HEAD WO CONTRAST  . B12 and Folate Panel  . Methylmalonic acid, serum   Meds ordered this encounter  Medications  . predniSONE (DELTASONE) 20 MG tablet    Sig: 60mg  (3 tabs) daily with food in the morning at onset of Cluster Headache x 7 days    Dispense:  21 tablet    Refill:  3  . SUMAtriptan Succinate (ZEMBRACE SYMTOUCH) 3 MG/0.5ML SOAJ    Sig: Inject 3 mg into the skin once as needed for up to 1 dose. May repeat in 15 minutes. If symptoms persist, repeat in 2 hours. Max 4 injections daily.    Dispense:  2 pen    Refill:  0    [id number] 3/21     - need MRI brain/MRA head to evaluate for thalamic lesion (can be sene in cluster-type headaches), mass, aneurysm, SAH or other causes of thunderclap headache -RTC next August and we can prescribe or get samples of Emgality,  start treating in September for 3 months - When you have a cluster headache, inject Imitrex once and then again in 15 minutes if headache persists Start High dose steroids for one week 60mg  x 5-7 days MRI of the brain and an MRA of the head  Provided education on cluster and migraine headaches  To prevent or relieve headaches, try the following: Cool Compress. Lie down and place a cool compress on your head.  Avoid headache triggers. If certain foods or odors seem to have triggered your migraines in the past, avoid them. A headache diary might help you identify triggers.  Include physical activity in your daily routine. Try a daily walk or other moderate aerobic exercise.  Manage stress. Find healthy ways to cope with the stressors, such as delegating tasks on your to-do list.  Practice relaxation techniques. Try deep breathing, yoga, massage and visualization.  Eat regularly. Eating regularly scheduled meals and maintaining a healthy diet might help prevent  headaches. Also, drink plenty of fluids.  Follow a regular sleep schedule. Sleep deprivation might contribute to headaches Consider biofeedback. With this mind-body technique, you learn to control certain bodily functions - such as muscle tension, heart rate and blood pressure - to prevent headaches or reduce headache pain.    Proceed to emergency room if you experience new or worsening symptoms or symptoms do not resolve, if you have new neurologic symptoms or if headache is severe, or for any concerning symptom.   Provided education and documentation from American headache Society toolbox including articles on: cluster headache, chronic migraine medication overuse headache, chronic migraines, prevention of migraines, behavioral and other nonpharmacologic treatments for headache.  Cc: Dr Mitchel Honour, Dr Pamella Pert   Sarina Ill, MD  [facility] [address] [place], [place] [address]  Phone [phone] Fax [phone]

## 2018-11-21 NOTE — Patient Instructions (Addendum)
When you have a cluster headache, inject Imitrex once and then again in 15 minutes if headache persists Start High dose steroids for one week 60mg  x 5-7 days MRI of the brain and an MRA of the head     Cluster Headache A cluster headache is a type of headache that causes deep, intense head pain. Cluster headaches can last from 15 minutes to 3 hours. They usually occur:  On one side of the head. They may occur on the other side when a new cluster of headaches begins.  Repeatedly over weeks to months.  Several times a day.  At the same time of day, often at night.  More often in the fall and springtime.  What are the causes? The cause of this condition is not known. What increases the risk? This condition is more likely to develop in:  Males.  People who drink alcohol.  People who smoke or use products that contain nicotine or tobacco.  People who take medicines that cause blood vessels to expand, such as nitroglycerin.  People who take antihistamines.  What are the signs or symptoms? Symptoms of this condition include:  Severe pain on one side of the head that begins behind or around your eye or temple.  Pain on one side of the head.  Nausea.  Sensitivity to light.  Runny nose and nasal stuffiness.  Sweaty, pale skin on the face.  Droopy or swollen eyelid, eye redness, or tearing.  Restlessness and agitation.  How is this diagnosed? This condition may be diagnosed based on:  Your symptoms.  A physical exam.  Your health care provider may order tests to see if your headaches are caused by another medical condition. These tests may show that you do not have cluster headaches. Tests may include:  A CT scan of your head.  An MRI of your head.  Lab tests.  How is this treated? This condition may be treated with:  Medicines to relieve pain and to prevent repeated (recurrent) attacks. Some people may need a combination of medicines.  Oxygen. This  helps to relieve pain.  Follow these instructions at home: Headache diary Keep a headache diary as told by your health care provider. Doing this can help you and your health care provider figure out what triggers your headaches. In your headache diary, include information about:  The time of day that your headache started and what you were doing when it began.  How long your headache lasted.  Where your pain started and whether it moved to other areas.  The type of pain, such as burning, stabbing, throbbing, or cramping.  Your level of pain. Use a pain scale and rate the pain with a number from 1 (mild) up to 10 (severe).  The treatment that you used, and any change in symptoms after treatment.  Medicines  Take over-the-counter and prescription medicines only as told by your health care provider.  Do not drive or use heavy machinery while taking prescription pain medicine.  Use oxygen as told by your health care provider. Lifestyle  Follow a regular sleep schedule. Do not vary the time that you go to bed or the amount that you sleep from day to day. It is important to stay on the same schedule during a cluster period to help prevent headaches.  Exercise regularly.  Eat a healthy diet and avoid foods that may trigger your headaches.  Avoid alcohol.  Do not use any products that contain nicotine or tobacco, such  as cigarettes and e-cigarettes. If you need help quitting, ask your health care provider. Contact a health care provider if:  Your headaches change, become more severe, or occur more often.  The medicine or oxygen that your health care provider recommended does not help. Get help right away if:  You faint.  You have weakness or numbness, especially on one side of your body or face.  You have double vision.  You have nausea or vomiting that does not go away within several hours.  You have trouble talking, walking, or keeping your balance.  You have pain or  stiffness in your neck.  You have a fever. Summary  A cluster headache is a type of headache that causes deep, intense head pain, usually on one side of the head.  Keep a headache diary to help discover what triggers your headaches.  A regular sleep schedule can help prevent headaches. This information is not intended to replace advice given to you by your health care provider. Make sure you discuss any questions you have with your health care provider. Document Released: 12/12/2005 Document Revised: 08/23/2016 Document Reviewed: 08/23/2016 Elsevier Interactive Patient Education  2018 [employer].  Essential Tremor A tremor is trembling or shaking that you cannot control. Most tremors affect the hands or arms. Tremors can also affect the head, vocal cords, face, and other parts of the body. Essential tremor is a tremor without a known cause. What are the causes? Essential tremor has no known cause. What increases the risk? You may be at greater risk of essential tremor if:  You have a family member with essential tremor.  You are age 49 or older.  You take certain medicines.  What are the signs or symptoms? The main sign of a tremor is uncontrolled and unintentional rhythmic shaking of a body part.  You may have difficulty eating with a spoon or fork.  You may have difficulty writing.  You may nod your head up and down or side to side.  You may have a quivering voice.  Your tremors:  May get worse over time.  May come and go.  May be more noticeable on one side of your body.  May get worse due to stress, fatigue, caffeine, and extreme heat or cold.  How is this diagnosed? Your health care provider can diagnose essential tremor based on your symptoms, medical history, and a physical examination. There is no single test to diagnose an essential tremor. However, your health care provider may perform a variety of tests to rule out other conditions. Tests may  include:  Blood and urine tests.  Imaging studies of your brain, such as: ? CT scan. ? MRI.  A test that measures involuntary muscle movement (electromyogram).  How is this treated? Your tremors may go away without treatment. Mild tremors may not need treatment if they do not affect your day-to-day life. Severe tremors may need to be treated using one or a combination of the following options:  Medicines. This may include medicine that is injected.  Lifestyle changes.  Physical therapy.  Follow these instructions at home:  Take medicines only as directed by your health care provider.  Limit alcohol intake to no more than 1 drink per day for nonpregnant women and 2 drinks per day for men. One drink equals 12 oz of beer, 5 oz of wine, or 1 oz of hard liquor.  Do not use any tobacco products, including cigarettes, chewing tobacco, or electronic cigarettes. If you need  help quitting, ask your health care provider.  Take medicines only as directed by your health care provider.  Avoid extreme heat or cold.  Limit the amount of caffeine you consumeas directed by your health care provider.  Try to get eight hours of sleep each night.  Find ways to manage your stress, such as meditation or yoga.  Keep all follow-up visits as directed by your health care provider. This is important. This includes any physical therapy visits. Contact a health care provider if:  You experience any changes in the location or intensity of your tremors.  You start having a tremor after starting a new medicine.  You have tremor with other symptoms such as: ? Numbness. ? Tingling. ? Pain. ? Weakness.  Your tremor gets worse.  Your tremor interferes with your daily life. This information is not intended to replace advice given to you by your health care provider. Make sure you discuss any questions you have with your health care provider. Document Released: 01/02/2015 Document Revised:  05/19/2016 Document Reviewed: 06/09/2014 Elsevier Interactive Patient Education  2018 [employer].  Sumatriptan injection What is this medicine? SUMATRIPTAN (soo ma TRIP tan) is used to treat migraines with or without aura. An aura is a strange feeling or visual disturbance that warns you of an attack. It is not used to prevent migraines. This medicine may be used for other purposes; ask your health care provider or pharmacist if you have questions. COMMON BRAND NAME(S): Alsuma, Imitrex, Imitrex Statdose, Sumavel DosePro System What should I tell my health care provider before I take this medicine? They need to know if you have any of these conditions: -circulation problems in fingers and toes -diabetes -heart disease -high blood pressure -high cholesterol -history of irregular heartbeat -history of stroke -kidney disease -liver disease -postmenopausal or surgical removal of uterus and ovaries -seizures -smoke tobacco -stomach or intestine problems -an unusual or allergic reaction to sumatriptan, other medicines, foods, dyes, or preservatives -pregnant or trying to get pregnant -breast-feeding How should I use this medicine? This medicine is for injection under the skin. Follow the directions on the prescription label. Only use this medicine at the first symptoms of a migraine. It is not for everyday use. If you are using an autoinjector, read the instruction leaflet carefully. A single injection is given just under the skin. Before you make an injection, clean and examine your skin. Do not inject at a place where the skin is damaged or infected. If your symptoms return you can use a second injection. If there is no improvement at all in your symptoms after the first injection, call your doctor or health care professional. Wait at least 1 hour between doses and do not use more than 6 mg as a single dose. Do not use more than 12 mg total in any 24 hour period. Do not use your medicine  more often than directed. Talk to your pediatrician regarding the use of this medicine in children. Special care may be needed. Overdosage: If you think you have taken too much of this medicine contact a poison control center or emergency room at once. NOTE: This medicine is only for you. Do not share this medicine with others. What if I miss a dose? This does not apply; this medicine is not for regular use. What may interact with this medicine? Do not take this medicine with any of the following medicines: -cocaine -ergot alkaloids like dihydroergotamine, ergonovine, ergotamine, methylergonovine -feverfew -MAOIs like Carbex, Eldepryl, Marplan, Nardil,  and Parnate -other medicines for migraine headache like almotriptan, eletriptan, frovatriptan, naratriptan, rizatriptan, zolmitriptan -tryptophan This medicine may also interact with the following medications: -certain medicines for depression, anxiety, or psychotic disturbances This list may not describe all possible interactions. Give your health care provider a list of all the medicines, herbs, non-prescription drugs, or dietary supplements you use. Also tell them if you smoke, drink alcohol, or use illegal drugs. Some items may interact with your medicine. What should I watch for while using this medicine? Only take this medicine for a migraine headache. Take it if you get warning symptoms or at the start of a migraine attack. It is not for regular use to prevent migraine attacks. You may get drowsy or dizzy. Do not drive, use machinery, or do anything that needs mental alertness until you know how this medicine affects you. Do not stand or sit up quickly, especially if you are an older patient. This reduces the risk of dizzy or fainting spells. Alcohol may interfere with the effect of this medicine. Avoid alcoholic drinks. Smoking cigarettes may increase the risk of heart-related side effects from using this medicine. If you take migraine  medicines for 10 or more days a month, your migraines may get worse. Keep a diary of headache days and medicine use. Contact your healthcare professional if your migraine attacks occur more frequently. What side effects may I notice from receiving this medicine? Side effects that you should report to your doctor or health care professional as soon as possible: -allergic reactions like skin rash, itching or hives, swelling of the face, lips, or tongue -bloody or watery diarrhea -hallucination, loss of contact with reality -pain, tingling, numbness in the face, hands, or feet -seizures -signs and symptoms of a blood clot such as breathing problems; changes in vision; chest pain; severe, sudden headache; pain, swelling, warmth in the leg; trouble speaking; sudden numbness or weakness of the face, arm, or leg -signs and symptoms of a dangerous change in heartbeat or heart rhythm like chest pain; dizziness; fast or irregular heartbeat; palpitations, feeling faint or lightheaded; falls; breathing problems -signs and symptoms of a stroke like changes in vision; confusion; trouble speaking or understanding; severe headaches; sudden numbness or weakness of the face, arm, or leg; trouble walking; dizziness; loss of balance or coordination -stomach pain Side effects that usually do not require medical attention (report to your doctor or health care professional if they continue or are bothersome): -changes in taste -facial flushing -headache -muscle cramps -muscle pain -nausea, vomiting -pain, redness, or irritation at site where injected -weak or tired This list may not describe all possible side effects. Call your doctor for medical advice about side effects. You may report side effects to FDA at 1-800-FDA-1088. Where should I keep my medicine? Keep out of the reach of children. Store at room temperature between 2 and 30 degrees C (36 and 86 degrees F). Protect from light. Throw away any unused  medicine after the expiration date. Make sure you receive a puncture-resistant container to dispose of the needles and syringes once you have finished with them. Do not reuse these items. Return the container to your health care professional for proper disposal. Keep the autoinjector device. NOTE: This sheet is a summary. It may not cover all possible information. If you have questions about this medicine, talk to your doctor, pharmacist, or health care provider.  2018 Elsevier/Gold Standard (2016-01-14 12:41:27)  Prednisone tablets What is this medicine? PREDNISONE (PRED ni sone) is a corticosteroid.  It is commonly used to treat inflammation of the skin, joints, lungs, and other organs. Common conditions treated include asthma, allergies, and arthritis. It is also used for other conditions, such as blood disorders and diseases of the adrenal glands. This medicine may be used for other purposes; ask your health care provider or pharmacist if you have questions. COMMON BRAND NAME(S): Deltasone, Predone, Sterapred, Sterapred DS What should I tell my health care provider before I take this medicine? They need to know if you have any of these conditions: -Cushing's syndrome -diabetes -glaucoma -heart disease -high blood pressure -infection (especially a virus infection such as chickenpox, cold sores, or herpes) -kidney disease -liver disease -mental illness -myasthenia gravis -osteoporosis -seizures -stomach or intestine problems -thyroid disease -an unusual or allergic reaction to lactose, prednisone, other medicines, foods, dyes, or preservatives -pregnant or trying to get pregnant -breast-feeding How should I use this medicine? Take this medicine by mouth with a glass of water. Follow the directions on the prescription label. Take this medicine with food. If you are taking this medicine once a day, take it in the morning. Do not take more medicine than you are told to take. Do not  suddenly stop taking your medicine because you may develop a severe reaction. Your doctor will tell you how much medicine to take. If your doctor wants you to stop the medicine, the dose may be slowly lowered over time to avoid any side effects. Talk to your pediatrician regarding the use of this medicine in children. Special care may be needed. Overdosage: If you think you have taken too much of this medicine contact a poison control center or emergency room at once. NOTE: This medicine is only for you. Do not share this medicine with others. What if I miss a dose? If you miss a dose, take it as soon as you can. If it is almost time for your next dose, talk to your doctor or health care professional. You may need to miss a dose or take an extra dose. Do not take double or extra doses without advice. What may interact with this medicine? Do not take this medicine with any of the following medications: -metyrapone -mifepristone This medicine may also interact with the following medications: -aminoglutethimide -amphotericin B -aspirin and aspirin-like medicines -barbiturates -certain medicines for diabetes, like glipizide or glyburide -cholestyramine -cholinesterase inhibitors -cyclosporine -digoxin -diuretics -ephedrine -male hormones, like estrogens and birth control pills -isoniazid -ketoconazole -NSAIDS, medicines for pain and inflammation, like ibuprofen or naproxen -phenytoin -rifampin -toxoids -vaccines -warfarin This list may not describe all possible interactions. Give your health care provider a list of all the medicines, herbs, non-prescription drugs, or dietary supplements you use. Also tell them if you smoke, drink alcohol, or use illegal drugs. Some items may interact with your medicine. What should I watch for while using this medicine? Visit your doctor or health care professional for regular checks on your progress. If you are taking this medicine over a prolonged  period, carry an identification card with your name and address, the type and dose of your medicine, and your doctor's name and address. This medicine may increase your risk of getting an infection. Tell your doctor or health care professional if you are around anyone with measles or chickenpox, or if you develop sores or blisters that do not heal properly. If you are going to have surgery, tell your doctor or health care professional that you have taken this medicine within the last twelve months. Ask your  doctor or health care professional about your diet. You may need to lower the amount of salt you eat. This medicine may affect blood sugar levels. If you have diabetes, check with your doctor or health care professional before you change your diet or the dose of your diabetic medicine. What side effects may I notice from receiving this medicine? Side effects that you should report to your doctor or health care professional as soon as possible: -allergic reactions like skin rash, itching or hives, swelling of the face, lips, or tongue -changes in emotions or moods -changes in vision -depressed mood -eye pain -fever or chills, cough, sore throat, pain or difficulty passing urine -increased thirst -swelling of ankles, feet Side effects that usually do not require medical attention (report to your doctor or health care professional if they continue or are bothersome): -confusion, excitement, restlessness -headache -nausea, vomiting -skin problems, acne, thin and shiny skin -trouble sleeping -weight gain This list may not describe all possible side effects. Call your doctor for medical advice about side effects. You may report side effects to FDA at 1-800-FDA-1088. Where should I keep my medicine? Keep out of the reach of children. Store at room temperature between 15 and 30 degrees C (59 and 86 degrees F). Protect from light. Keep container tightly closed. Throw away any unused medicine after  the expiration date. NOTE: This sheet is a summary. It may not cover all possible information. If you have questions about this medicine, talk to your doctor, pharmacist, or health care provider.  2018 Elsevier/Gold Standard (2011-07-28 10:57:14)

## 2018-11-26 ENCOUNTER — Other Ambulatory Visit: Payer: Self-pay | Admitting: Family Medicine

## 2018-11-26 LAB — METHYLMALONIC ACID, SERUM: Methylmalonic Acid: 202 nmol/L (ref 0–378)

## 2018-11-26 LAB — B12 AND FOLATE PANEL
FOLATE: 15.1 ng/mL (ref >3.0)
Vitamin B-12: 469 pg/mL (ref 232–1245)

## 2018-11-27 ENCOUNTER — Telehealth: Payer: Self-pay | Admitting: Neurology

## 2018-11-27 NOTE — Telephone Encounter (Signed)
MR Brain w/wo contrast & MRA Head wo contrast Dr. Ihor Dow Auth: [id number] (exp. 11/26/18 to 12/25/18). Patient is scheduled at [facility] for 11/28/18.

## 2018-11-28 ENCOUNTER — Telehealth: Payer: Self-pay | Admitting: Neurology

## 2018-11-28 ENCOUNTER — Ambulatory Visit: Payer: BC Managed Care – PPO

## 2018-11-28 DIAGNOSIS — E538 Deficiency of other specified B group vitamins: Secondary | ICD-10-CM

## 2018-11-28 DIAGNOSIS — G44011 Episodic cluster headache, intractable: Secondary | ICD-10-CM | POA: Diagnosis not present

## 2018-11-28 DIAGNOSIS — I609 Nontraumatic subarachnoid hemorrhage, unspecified: Secondary | ICD-10-CM

## 2018-11-28 DIAGNOSIS — G4453 Primary thunderclap headache: Secondary | ICD-10-CM

## 2018-11-28 DIAGNOSIS — I671 Cerebral aneurysm, nonruptured: Secondary | ICD-10-CM | POA: Diagnosis not present

## 2018-11-28 MED ORDER — GADOBENATE DIMEGLUMINE 529 MG/ML IV SOLN
15.0000 mL | Freq: Once | INTRAVENOUS | Status: AC | PRN
  Administered 2018-11-28: 15 mL via INTRAVENOUS

## 2018-11-28 NOTE — Telephone Encounter (Signed)
Patient wanted Dr. Jaynee Eagles to know he had headache last night, this morning and currently before having MRI. He only took ibuprofen and it did help. Best call back [phone]

## 2018-11-29 ENCOUNTER — Encounter: Payer: Self-pay | Admitting: *Deleted

## 2018-11-29 NOTE — Telephone Encounter (Signed)
Sent pt mychart message

## 2018-12-02 ENCOUNTER — Other Ambulatory Visit: Payer: Self-pay | Admitting: Family Medicine

## 2018-12-04 NOTE — Telephone Encounter (Signed)
Requested medication (s) are due for refill today: yyes  Requested medication (s) are on the active medication list: yes  Last refill:  09/02/18  Future visit scheduled: yes  Notes to clinic: historical provider   Requested Prescriptions  Pending Prescriptions Disp Refills   clobetasol ointment (TEMOVATE) 0.05 % [Pharmacy Med Name: CLOBETASOL PROP 0.05% OINTMENT 30GM] 30 g 0    Sig: APPLY EXTERNALLY TO THE AFFECTED AREA TWICE DAILY     Dermatology:  Corticosteroids Passed - 12/02/2018 11:53 AM      Passed - Valid encounter within last 12 months    Recent Outpatient Visits          1 month ago Prediabetes   [facility], Lilia Argue, MD   2 months ago Acute nonintractable headache, unspecified headache type   Primary Care at [facility], Ines Bloomer, MD   7 months ago Essential hypertension   Primary Care at [facility], Renette Butters, MD   1 year ago Routine physical examination   Primary Care at [facility], Renette Butters, MD   1 year ago Glucose intolerance (impaired glucose tolerance)   Primary Care at [facility], Renette Butters, MD      Future Appointments            In 5 months Rutherford Guys, MD Primary Care at [place], [facility]

## 2018-12-04 NOTE — Telephone Encounter (Signed)
Please advise 

## 2018-12-13 ENCOUNTER — Other Ambulatory Visit: Payer: Self-pay | Admitting: Family Medicine

## 2018-12-13 NOTE — Telephone Encounter (Signed)
Requested medication (s) are due for refill today: yes  Requested medication (s) are on the active medication list: yes  Last refill:  09/11/18 for 90 tabs  Future visit scheduled: yes  Notes to clinic:  Cardiovascular: anti lipid - statins failed  Requested Prescriptions  Pending Prescriptions Disp Refills   rosuvastatin (CRESTOR) 10 MG tablet [Pharmacy Med Name: ROSUVASTATIN 10MG  TABLETS] 90 tablet 0    Sig: TAKE 1 TABLET BY MOUTH DAILY     Cardiovascular:  Antilipid - Statins Failed - 12/13/2018 12:48 PM      Failed - HDL in normal range and within 360 days    HDL  Date Value Ref Range Status  04/30/2018 38 (L) >39 mg/dL Final         Failed - Triglycerides in normal range and within 360 days    Triglycerides  Date Value Ref Range Status  04/30/2018 168 (H) 0 - 149 mg/dL Final         Passed - Total Cholesterol in normal range and within 360 days    Cholesterol, Total  Date Value Ref Range Status  04/30/2018 121 100 - 199 mg/dL Final         Passed - LDL in normal range and within 360 days    LDL Calculated  Date Value Ref Range Status  04/30/2018 49 0 - 99 mg/dL Final         Passed - Patient is not pregnant      Passed - Valid encounter within last 12 months    Recent Outpatient Visits          1 month ago Prediabetes   [facility] at [facility], Lilia Argue, MD   2 months ago Acute nonintractable headache, unspecified headache type   Primary Care at [facility], Ines Bloomer, MD   7 months ago Essential hypertension   Primary Care at [facility], Renette Butters, MD   1 year ago Routine physical examination   Primary Care at [facility], Renette Butters, MD   1 year ago Glucose intolerance (impaired glucose tolerance)   Primary Care at [facility], Renette Butters, MD      Future Appointments            In 4 months Rutherford Guys, MD Primary Care at [place], [facility]

## 2018-12-14 ENCOUNTER — Other Ambulatory Visit: Payer: Self-pay | Admitting: Family Medicine

## 2018-12-14 NOTE — Telephone Encounter (Signed)
Requested medication (s) are due for refill today: yes  Requested medication (s) are on the active medication list: yes  Last refill:  09/05/18  Future visit scheduled:yes  Notes to clinic:  Off-Protocol    Requested Prescriptions  Pending Prescriptions Disp Refills   fluocinonide cream (LIDEX) 0.05 % [Pharmacy Med Name: FLUOCINONIDE 0.05% CREAM 30GM] 30 g 1    Sig: APPLY SPARINGLY TO THE AFFECTED TOPICAL AREA TWICE DAILY     Off-Protocol Failed - 12/14/2018 12:55 PM      Failed - Medication not assigned to a protocol, review manually.      Passed - Valid encounter within last 12 months    Recent Outpatient Visits          1 month ago Prediabetes   [facility] at [facility], Lilia Argue, MD   2 months ago Acute nonintractable headache, unspecified headache type   Primary Care at [facility], Ines Bloomer, MD   7 months ago Essential hypertension   Primary Care at [facility], Renette Butters, MD   1 year ago Routine physical examination   Primary Care at [facility], Renette Butters, MD   1 year ago Glucose intolerance (impaired glucose tolerance)   Primary Care at [facility], Renette Butters, MD      Future Appointments            In 4 months Rutherford Guys, MD Primary Care at [place], [facility]

## 2018-12-31 ENCOUNTER — Other Ambulatory Visit: Payer: Self-pay | Admitting: Family Medicine

## 2019-01-01 NOTE — Telephone Encounter (Signed)
Requested Prescriptions  Pending Prescriptions Disp Refills  . clobetasol ointment (TEMOVATE) 0.05 % [Pharmacy Med Name: CLOBETASOL PROP 0.05% OINTMENT 15GM] 30 g 0    Sig: APPLY EXTERNALLY TO THE AFFECTED AREA TWICE DAILY     Dermatology:  Corticosteroids Passed - 12/31/2018  1:11 PM      Passed - Valid encounter within last 12 months    Recent Outpatient Visits          2 months ago Prediabetes   [facility] at [facility], Lilia Argue, MD   3 months ago Acute nonintractable headache, unspecified headache type   Primary Care at [facility] [place], Ines Bloomer, MD   8 months ago Essential hypertension   Primary Care at [facility], Renette Butters, MD   1 year ago Routine physical examination   Primary Care at [facility], Renette Butters, MD   1 year ago Glucose intolerance (impaired glucose tolerance)   Primary Care at [facility], Renette Butters, MD      Future Appointments            In 4 months Rutherford Guys, MD Primary Care at [place], [facility]         . fluocinolone (VANOS) 0.01 % cream [Pharmacy Med Name: FLUOCINOLONE ACET 0.01% CRM 15GM] 30 g 1    Sig: APPLY EXTERNALLY TO THE AFFECTED AREA TWICE DAILY     Dermatology:  Corticosteroids Passed - 12/31/2018  1:11 PM      Passed - Valid encounter within last 12 months    Recent Outpatient Visits          2 months ago Prediabetes   [facility] at [facility], Lilia Argue, MD   3 months ago Acute nonintractable headache, unspecified headache type   Primary Care at [facility], Ines Bloomer, MD   8 months ago Essential hypertension   Primary Care at [facility], Renette Butters, MD   1 year ago Routine physical examination   Primary Care at [facility], Renette Butters, MD   1 year ago Glucose intolerance (impaired glucose tolerance)   Primary Care at [facility], Renette Butters, MD      Future Appointments            In 4 months Rutherford Guys, MD Primary Care at [place], [facility]

## 2019-01-10 ENCOUNTER — Other Ambulatory Visit: Payer: Self-pay | Admitting: Neurology

## 2019-02-22 ENCOUNTER — Other Ambulatory Visit: Payer: Self-pay | Admitting: Family Medicine

## 2019-02-22 NOTE — Telephone Encounter (Signed)
Requested Prescriptions  Pending Prescriptions Disp Refills  . amLODipine (NORVASC) 5 MG tablet [Pharmacy Med Name: AMLODIPINE BESYLATE 5MG  TABLETS] 90 tablet 0    Sig: TAKE 1 TABLET(5 MG) BY MOUTH DAILY     Cardiovascular:  Calcium Channel Blockers Failed - 02/22/2019 10:05 AM      Failed - Last BP in normal range    BP Readings from Last 1 Encounters:  11/21/18 132/90         Passed - Valid encounter within last 6 months    Recent Outpatient Visits          3 months ago Prediabetes   [facility] at [facility], Lilia Argue, MD   4 months ago Acute nonintractable headache, unspecified headache type   Primary Care at [facility], Ines Bloomer, MD   9 months ago Essential hypertension   Primary Care at [facility], Renette Butters, MD   1 year ago Routine physical examination   Primary Care at [facility] Renette Butters, MD   1 year ago Glucose intolerance (impaired glucose tolerance)   Primary Care at [facility], Renette Butters, MD      Future Appointments            In 2 months Rutherford Guys, MD Primary Care at [place], [facility]

## 2019-04-01 ENCOUNTER — Ambulatory Visit: Payer: Self-pay | Admitting: Family Medicine

## 2019-04-01 ENCOUNTER — Other Ambulatory Visit: Payer: Self-pay | Admitting: Family Medicine

## 2019-04-01 NOTE — Telephone Encounter (Signed)
Pt. Noticed 1-2 months ago right foot hurting , slight swelling. Arch also hurts. No wounds or sores. No redness. Cut his Great toenail and it looks yellow under the nail.Spoke with [employer] and pt. Scheduled for tomorrow. Answer Assessment - Initial Assessment Questions 1. ONSET: "When did the pain start?"      Started 1-2 months ago 2. LOCATION: "Where is the pain located?"      Right foot and Great toe 3. PAIN: "How bad is the pain?"    (Scale 1-10; or mild, moderate, severe)   -  MILD (1-3): doesn't interfere with normal activities    -  MODERATE (4-7): interferes with normal activities (e.g., work or school) or awakens from sleep, limping    -  SEVERE (8-10): excruciating pain, unable to do any normal activities, unable to walk     6 4. WORK OR EXERCISE: "Has there been any recent work or exercise that involved this part of the body?"      No 5. CAUSE: "What do you think is causing the foot pain?"     Unsure - but is Pre diabetic 6. OTHER SYMPTOMS: "Do you have any other symptoms?" (e.g., leg pain, rash, fever, numbness)     Great toenail had something under it - he cut the toenail and it is yellow. 7. PREGNANCY: "Is there any chance you are pregnant?" "When was your last menstrual period?"     N/A  Protocols used: FOOT PAIN-A-AH

## 2019-04-02 ENCOUNTER — Encounter: Payer: Self-pay | Admitting: Emergency Medicine

## 2019-04-02 ENCOUNTER — Telehealth (INDEPENDENT_AMBULATORY_CARE_PROVIDER_SITE_OTHER): Payer: BC Managed Care – PPO | Admitting: Emergency Medicine

## 2019-04-02 DIAGNOSIS — I10 Essential (primary) hypertension: Secondary | ICD-10-CM

## 2019-04-02 DIAGNOSIS — R7303 Prediabetes: Secondary | ICD-10-CM | POA: Diagnosis not present

## 2019-04-02 DIAGNOSIS — M79671 Pain in right foot: Secondary | ICD-10-CM | POA: Diagnosis not present

## 2019-04-02 DIAGNOSIS — M109 Gout, unspecified: Secondary | ICD-10-CM | POA: Insufficient documentation

## 2019-04-02 MED ORDER — TRAMADOL HCL 50 MG PO TABS: 50.0000 mg | ORAL_TABLET | Freq: Three times a day (TID) | ORAL | 0 refills | Status: DC | PRN

## 2019-04-02 MED ORDER — DICLOFENAC SODIUM 75 MG PO TBEC: 75.0000 mg | DELAYED_RELEASE_TABLET | Freq: Two times a day (BID) | ORAL | 0 refills | Status: AC

## 2019-04-02 NOTE — Progress Notes (Signed)
Pt c/o of swollen right foot and some discomfort on left foot, it has been going on for about a month and is getting worse. evaluating legs helps. No travel.

## 2019-04-02 NOTE — Progress Notes (Signed)
Virtual Visit via Video Note  I connected with Rickey Cruz on 04/02/19 at  3:00 PM EDT by a video enabled telemedicine application and verified that I am speaking with the correct person using two identifiers.   I discussed the limitations of evaluation and management by telemedicine and the availability of in person appointments. The patient expressed understanding and agreed to proceed.  History of Present Illness: 58 year old male complaining of pain and inflammation to distal right foot for the past several days.  Denies injury.  Has history of hypertension and takes hydrochlorothiazide.  Has a distant history of gout.  No other significant symptoms. Patient also has a history of prediabetes and thinks his sugars may be running high, 150's.  On no medications.  Reluctant to take any.   Patient Active Problem List   Diagnosis Date Noted  . Cluster headache 11/21/2018  . Glucose intolerance (impaired glucose tolerance) 12/23/2014  . Hyperlipidemia 12/23/2014  . Psoriasis 06/27/2014  . Multiple lung nodules 12/09/2011  . PULMONARY HYPERTENSION 03/04/2010  . Essential hypertension 02/10/2010  . Asthma in adult 02/10/2010  . OBSTRUCTIVE SLEEP APNEA 02/04/2010  . TUBERCULOSIS, HX OF 02/04/2010    Observations/Objective: Patient awake and alert oriented x3 in no respiratory distress. Right foot shows erythema and obvious inflammation to first second and third metatarsal phalangeal joints.  Assessment and Plan: Gout a likely possibility given the relationship of hydrochlorothiazide and hyperuricemia.  Patient's renal function is normal.  Lab Results  Component Value Date   CREATININE 0.97 11/01/2018   BUN 17 11/01/2018   NA 142 11/01/2018   K 3.7 11/01/2018   CL 104 11/01/2018   CO2 22 11/01/2018  Normal GFR.  Diagnoses and all orders for this visit:  Right foot pain -     diclofenac (VOLTAREN) 75 MG EC tablet; Take 1 tablet (75 mg total) by mouth 2 (two) times daily for 5  days. After 5 days take as needed. -     traMADol (ULTRAM) 50 MG tablet; Take 1 tablet (50 mg total) by mouth every 8 (eight) hours as needed for severe pain.  Essential hypertension  Prediabetes  Acute gouty arthritis -     diclofenac (VOLTAREN) 75 MG EC tablet; Take 1 tablet (75 mg total) by mouth 2 (two) times daily for 5 days. After 5 days take as needed. -     traMADol (ULTRAM) 50 MG tablet; Take 1 tablet (50 mg total) by mouth every 8 (eight) hours as needed for severe pain.    Follow Up Instructions: Clinically stable.  No red flag signs or symptoms. Gout instructions given.  Medications as prescribed. Follow-up in 2 to 3 months.  Blood work then.   I discussed the assessment and treatment plan with the patient. The patient was provided an opportunity to ask questions and all were answered. The patient agreed with the plan and demonstrated an understanding of the instructions.   The patient was advised to call back or seek an in-person evaluation if the symptoms worsen or if the condition fails to improve as anticipated.  I provided 25 minutes of non-face-to-face time during this encounter.   Horald Pollen, [place]

## 2019-04-13 ENCOUNTER — Other Ambulatory Visit: Payer: Self-pay | Admitting: Family Medicine

## 2019-04-15 ENCOUNTER — Other Ambulatory Visit: Payer: Self-pay | Admitting: Family Medicine

## 2019-04-15 NOTE — Telephone Encounter (Signed)
Requested medication (s) are due for refill today: yes  Requested medication (s) are on the active medication list: yes  Last refill: 04/01/2019  Future visit scheduled: yes  Notes to clinic:  No protocol   Requested Prescriptions  Pending Prescriptions Disp Refills   fluocinonide cream (LIDEX) 0.05 % [Pharmacy Med Name: FLUOCINONIDE 0.05% CREAM 30GM] 30 g 1    Sig: [place]     Off-Protocol Failed - 04/15/2019  3:44 AM      Failed - Medication not assigned to a protocol, review manually.      Passed - Valid encounter within last 12 months    Recent Outpatient Visits          5 months ago Prediabetes   Primary Care at [facility], Lilia Argue, MD   6 months ago Acute nonintractable headache, unspecified headache type   Primary Care at [facility], Ines Bloomer, MD   11 months ago Essential hypertension   Primary Care at [place], Renette Butters, MD   1 year ago Routine physical examination   Primary Care at [facility], Renette Butters, MD   1 year ago Glucose intolerance (impaired glucose tolerance)   Primary Care at [facility], Renette Butters, MD      Future Appointments            In 2 weeks Rutherford Guys, MD Primary Care at [place], [facility]

## 2019-04-16 NOTE — Telephone Encounter (Signed)
Patient is requesting a refill of the following medications: Requested Prescriptions   Pending Prescriptions Disp Refills  . fluocinonide cream (LIDEX) 0.05 % [Pharmacy Med Name: FLUOCINONIDE 0.05% CREAM 30GM] 30 g 1    Sig: APPLY SPRAINGLY TO TO THE AFFECTED AREA TWICE DAILY    Date of patient request: 04/16/2019 Last office visit: 04/02/2019 telemed visit Date of last refill: 12/14/18 Last refill amount: 30 g with one refill Follow up time period per chart: next appt 05/03/2019

## 2019-05-03 ENCOUNTER — Ambulatory Visit: Payer: Self-pay | Admitting: Family Medicine

## 2019-05-21 ENCOUNTER — Other Ambulatory Visit: Payer: Self-pay | Admitting: Family Medicine

## 2019-06-13 ENCOUNTER — Ambulatory Visit (INDEPENDENT_AMBULATORY_CARE_PROVIDER_SITE_OTHER): Payer: BC Managed Care – PPO | Admitting: Family Medicine

## 2019-06-13 ENCOUNTER — Other Ambulatory Visit: Payer: Self-pay

## 2019-06-13 ENCOUNTER — Encounter: Payer: Self-pay | Admitting: Family Medicine

## 2019-06-13 VITALS — BP 120/83 | HR 70 | Temp 98.4°F | Ht 65.0 in | Wt 178.0 lb

## 2019-06-13 DIAGNOSIS — H3530 Unspecified macular degeneration: Secondary | ICD-10-CM

## 2019-06-13 DIAGNOSIS — R7303 Prediabetes: Secondary | ICD-10-CM | POA: Diagnosis not present

## 2019-06-13 DIAGNOSIS — Z0001 Encounter for general adult medical examination with abnormal findings: Secondary | ICD-10-CM | POA: Diagnosis not present

## 2019-06-13 DIAGNOSIS — E7800 Pure hypercholesterolemia, unspecified: Secondary | ICD-10-CM | POA: Diagnosis not present

## 2019-06-13 DIAGNOSIS — Z113 Encounter for screening for infections with a predominantly sexual mode of transmission: Secondary | ICD-10-CM

## 2019-06-13 DIAGNOSIS — Z0000 Encounter for general adult medical examination without abnormal findings: Secondary | ICD-10-CM

## 2019-06-13 DIAGNOSIS — L409 Psoriasis, unspecified: Secondary | ICD-10-CM

## 2019-06-13 DIAGNOSIS — R972 Elevated prostate specific antigen [PSA]: Secondary | ICD-10-CM

## 2019-06-13 DIAGNOSIS — I10 Essential (primary) hypertension: Secondary | ICD-10-CM | POA: Diagnosis not present

## 2019-06-13 NOTE — Patient Instructions (Addendum)
Shingrix vaccine at pharmacy of choice, series of 2 vaccines    Preventive Care 40-64 Years, Male Preventive care refers to lifestyle choices and visits with your health care provider that can promote health and wellness. What does preventive care include?   A yearly physical exam. This is also called an annual well check.  Dental exams once or twice a year.  Routine eye exams. Ask your health care provider how often you should have your eyes checked.  Personal lifestyle choices, including: ? Daily care of your teeth and gums. ? Regular physical activity. ? Eating a healthy diet. ? Avoiding tobacco and drug use. ? Limiting alcohol use. ? Practicing safe sex. ? Taking low-dose aspirin every day starting at age 58. What happens during an annual well check? The services and screenings done by your health care provider during your annual well check will depend on your age, overall health, lifestyle risk factors, and family history of disease. Counseling Your health care provider may ask you questions about your:  Alcohol use.  Tobacco use.  Drug use.  Emotional well-being.  Home and relationship well-being.  Sexual activity.  Eating habits.  Work and work Statistician. Screening You may have the following tests or measurements:  Height, weight, and BMI.  Blood pressure.  Lipid and cholesterol levels. These may be checked every 5 years, or more frequently if you are over 28 years old.  Skin check.  Lung cancer screening. You may have this screening every year starting at age 52 if you have a 30-pack-year history of smoking and currently smoke or have quit within the past 15 years.  Colorectal cancer screening. All adults should have this screening starting at age 21 and continuing until age 46. Your health care provider may recommend screening at age 28. You will have tests every 1-10 years, depending on your results and the type of screening test. People at  increased risk should start screening at an earlier age. Screening tests may include: ? Guaiac-based fecal occult blood testing. ? Fecal immunochemical test (FIT). ? Stool DNA test. ? Virtual colonoscopy. ? Sigmoidoscopy. During this test, a flexible tube with a tiny camera (sigmoidoscope) is used to examine your rectum and lower colon. The sigmoidoscope is inserted through your anus into your rectum and lower colon. ? Colonoscopy. During this test, a long, thin, flexible tube with a tiny camera (colonoscope) is used to examine your entire colon and rectum.  Prostate cancer screening. Recommendations will vary depending on your family history and other risks.  Hepatitis C blood test.  Hepatitis B blood test.  Sexually transmitted disease (STD) testing.  Diabetes screening. This is done by checking your blood sugar (glucose) after you have not eaten for a while (fasting). You may have this done every 1-3 years. Discuss your test results, treatment options, and if necessary, the need for more tests with your health care provider. Vaccines Your health care provider may recommend certain vaccines, such as:  Influenza vaccine. This is recommended every year.  Tetanus, diphtheria, and acellular pertussis (Tdap, Td) vaccine. You may need a Td booster every 10 years.  Varicella vaccine. You may need this if you have not been vaccinated.  Zoster vaccine. You may need this after age 54.  Measles, mumps, and rubella (MMR) vaccine. You may need at least one dose of MMR if you were born in 1957 or later. You may also need a second dose.  Pneumococcal 13-valent conjugate (PCV13) vaccine. You may need this if you  have certain conditions and have not been vaccinated.  Pneumococcal polysaccharide (PPSV23) vaccine. You may need one or two doses if you smoke cigarettes or if you have certain conditions.  Meningococcal vaccine. You may need this if you have certain conditions.  Hepatitis A vaccine.  You may need this if you have certain conditions or if you travel or work in places where you may be exposed to hepatitis A.  Hepatitis B vaccine. You may need this if you have certain conditions or if you travel or work in places where you may be exposed to hepatitis B.  Haemophilus influenzae type b (Hib) vaccine. You may need this if you have certain risk factors. Talk to your health care provider about which screenings and vaccines you need and how often you need them. This information is not intended to replace advice given to you by your health care provider. Make sure you discuss any questions you have with your health care provider. Document Released: 01/08/2016 Document Revised: 02/01/2018 Document Reviewed: 10/13/2015 Elsevier Interactive Patient Education  [employer].   If you have lab work done today you will be contacted with your lab results within the next 2 weeks.  If you have not heard from [place] then please contact us. The fastest way to get your results is to register for My Chart.   IF you received an x-ray today, you will receive an invoice from [facility]. Please contact [facility] at [phone] with questions or concerns regarding your invoice.   IF you received labwork today, you will receive an invoice from [facility]. Please contact [facility] at [phone] with questions or concerns regarding your invoice.   Our billing staff will not be able to assist you with questions regarding bills from these companies.  You will be contacted with the lab results as soon as they are available. The fastest way to get your results is to activate your My Chart account. Instructions are located on the last page of this paperwork. If you have not heard from [place] regarding the results in 2 weeks, please contact this office.

## 2019-06-13 NOTE — Progress Notes (Signed)
6/18/20208:20 AM  Rickey Cruz 07/31/1961, 58 y.o., male [id number]  Chief Complaint  Patient presents with  . Annual Exam    went to eye doctor on Friday and was told he had a spot in the center of the eye. Was told he had some type of regeneration. Wants referral opthatmalogist for eval    HPI:   Patient is a 58 y.o. male with past medical history significant for prediabetes, HTN, HLP, elevated PSA, OSA s/p surgery, GERD, asthma, seasonal allergies, cluster headaches, colonic polyps, psoriasis who presents today for CPE and routine followup  Last OV Nov 2019 Last CPE Dec 2018  Colorectal Cancer Screening: 2017, colonoscopy, repeat in 10 years Prostate Cancer Screening: h/o elevated PSA, sees urology in July HIV Screening: 2019 STD screening: today Frequency of Dental evaluation: Q6 months, [employer], [employer] of Eye evaluation: just had yearly eye exam with optomerist, diagnosed with macular degeneration, early stages, patient did not feel questions were answered, requesting second opinion Shingles vaccine: pharmacy of choice  Most Recent Immunizations  Administered Date(s) Administered  . Hepatitis A, Adult 05/19/2016  . Hepatitis B, adult 12/28/2014  . Influenza,inj,Quad PF,6+ Mos 11/01/2018  . Meningococcal Conjugate 06/27/2014  . PPD Test 04/13/2009  . Pneumococcal Polysaccharide-23 11/25/2017  . Tdap 06/27/2014   Lab Results  Component Value Date   CHOL 121 04/30/2018   HDL 38 (L) 04/30/2018   LDLCALC 49 04/30/2018   TRIG 168 (H) 04/30/2018   CHOLHDL 3.2 04/30/2018   Lab Results  Component Value Date   CREATININE 0.97 11/01/2018   BUN 17 11/01/2018   NA 142 11/01/2018   K 3.7 11/01/2018   CL 104 11/01/2018   CO2 22 11/01/2018   Lab Results  Component Value Date   HGBA1C 5.9 (H) 11/01/2018    Has been working on [employer] with diet and exercise  Requesting referral to dermatology, has psoriasis, manages with topicals, has several scalp  lesions and has noticed significant hair loss in region  Fall Risk  06/13/2019 04/02/2019 11/01/2018 04/30/2018 11/25/2017  Falls in the past year? 0 0 0 No No  Number falls in past yr: 0 - - - -  Injury with Fall? 0 - - - -     Depression screen [facility] 2/9 06/13/2019 04/02/2019 04/30/2018  Decreased Interest 0 0 0  Down, Depressed, Hopeless 0 0 0  PHQ - 2 Score 0 0 0    No Known Allergies  Prior to Admission medications   Medication Sig Start Date End Date Taking? Authorizing Provider  clobetasol ointment (TEMOVATE) 0.38 % Apply 1 application topically 2 (two) times daily.   Yes [provider]  albuterol (PROAIR HFA) 108 (90 Base) MCG/ACT inhaler Inhale 2 puffs into the lungs every 4 (four) hours as needed for wheezing or shortness of breath. 11/25/17   [name], MD  amLODipine (NORVASC) 5 MG tablet TAKE 1 TABLET(5 MG) BY MOUTH DAILY 05/21/19   Rutherford Guys, MD  beclomethasone (QVAR) 80 MCG/ACT inhaler Inhale 2 puffs into the lungs 2 (two) times daily. 11/25/17   [name], MD  clobetasol ointment (TEMOVATE) 0.05 % APPLY EXTERNALLY TO THE AFFECTED AREA TWICE DAILY 04/01/19   Rutherford Guys, MD  Continuous Blood Gluc Sensor ([place]) MISC 1 each by Does not apply route every 14 (fourteen) days. 11/01/18   Rutherford Guys, MD  fluocinolone (VANOS) 0.01 % cream APPLY EXTERNALLY TO THE AFFECTED AREA TWICE DAILY 01/01/19  Rutherford Guys, MD  fluocinonide cream (LIDEX) 0.05 % APPLY SPRAINGLY TO TO THE AFFECTED AREA TWICE DAILY 04/17/19   Rutherford Guys, MD  fluticasone [facility]) 50 MCG/ACT nasal spray SHAKE LIQUID AND USE 2 SPRAYS IN [facility] NOSTRIL EVERY DAY 11/25/17   [name], MD  fluticasone (FLOVENT HFA) 110 MCG/ACT inhaler Inhale 2 puffs into the lungs 2 (two) times daily. 04/30/18   [name], MD  hydrochlorothiazide (HYDRODIURIL) 12.5 MG tablet TAKE 1 TABLET(12.5 MG) BY MOUTH DAILY 07/01/18   [name], MD  ketoconazole (NIZORAL) 2 % shampoo  APPLY EXTERNALLY 2 TIMES A WEEK. 11/25/17   [name], MD  montelukast (SINGULAIR) 10 MG tablet TAKE 1 TABLET(10 MG) BY MOUTH AT BEDTIME 11/25/17   [name], MD  rosuvastatin (CRESTOR) 10 MG tablet TAKE 1 TABLET BY MOUTH DAILY 12/13/18   Rutherford Guys, MD    Past Medical History:  Diagnosis Date  . Allergy    Flonase, Patanol, Zyrtec  . Arthritis    psoriasis  . Asthma    seasonal, with allergies  . Borderline diabetes   . Cluster headache   . Colon polyp 10/27/2011  . Depression    denies 11/11/16; denies 11/21/18 "been years"  . GERD (gastroesophageal reflux disease)   . History of kidney stones    x1  . Hyperlipidemia   . Hypertension   . Low back pain    "in kidney area-was told that it was related to gallstones"  . Prediabetes   . Psoriasis    [name]  . Severe sleep apnea    h/o; had procedure and weight control, does not have to wear CPAP  . 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 APPENDECTOMY    . CHOLECYSTECTOMY N/A 12/16/2014   Procedure: LAPAROSCOPIC CHOLECYSTECTOMY WITH INTRAOPERATIVE CHOLANGIOGRAM;  Surgeon: [name], MD;  Location: [facility];  Service: General;  Laterality: N/A;  . COLONOSCOPY  10/27/2011   single polyp. Repeat 5 years.  Marland Kitchen POLYPECTOMY    . PROSTATE BIOPSY  10/2014   will have another in 3 months  . SEPTOPLASTY  2014  . TONSILLECTOMY  2014  . UVULOPALATOPHARYNGOPLASTY  2014    Social History   Tobacco Use  . Smoking status: Never Smoker  . Smokeless tobacco: Never Used  Substance Use Topics  . Alcohol use: Never    Alcohol/week: 0.0 standard drinks    Frequency: Never    Family History  Problem Relation Age of Onset  . Colon cancer Neg Hx   . Esophageal cancer Neg Hx   . Stomach cancer Neg Hx   . Prostate cancer Neg Hx   . Diabetes Neg Hx   . CAD Neg Hx   . Migraines Neg Hx   . Headache Neg Hx     Review of Systems   Constitutional: Negative for chills and fever.  Respiratory: Negative for cough and shortness of breath.   Cardiovascular: Negative for chest pain, palpitations and leg swelling.  Gastrointestinal: Negative for abdominal pain, nausea and vomiting.  All other systems reviewed and are negative.    OBJECTIVE:  Today's Vitals   06/13/19 0815  BP: 120/83  Pulse: 70  Temp: 98.4 F (36.9 C)  TempSrc: Oral  SpO2: 94%  Weight: 178 lb (80.7 kg)  Height: 5' 5"  (1.651 m)   Body mass index is 29.62 kg/m.  Wt  Readings from Last 3 Encounters:  06/13/19 178 lb (80.7 kg)  11/21/18 186 lb (84.4 kg)  11/01/18 187 lb (84.8 kg)    Physical Exam Vitals signs and nursing note reviewed.  Constitutional:      Appearance: He is well-developed.  HENT:     Head: Normocephalic and atraumatic.     Right Ear: Hearing, tympanic membrane, ear canal and external ear normal.     Left Ear: Hearing, tympanic membrane, ear canal and external ear normal.     Mouth/Throat:     Pharynx: No oropharyngeal exudate.  Eyes:     Conjunctiva/sclera: Conjunctivae normal.     Pupils: Pupils are equal, round, and reactive to light.  Neck:     Musculoskeletal: Neck supple.     Thyroid: No thyromegaly.  Cardiovascular:     Rate and Rhythm: Normal rate and regular rhythm.     Heart sounds: Normal heart sounds. No murmur. No friction rub. No gallop.   Pulmonary:     Effort: Pulmonary effort is normal.     Breath sounds: Normal breath sounds. No wheezing, rhonchi or rales.  Abdominal:     General: Bowel sounds are normal. There is no distension.     Palpations: Abdomen is soft. There is no hepatomegaly, splenomegaly or mass.     Tenderness: There is no abdominal tenderness.     Hernia: No hernia is present.  Musculoskeletal: Normal range of motion.     Right lower leg: No edema.     Left lower leg: No edema.  Lymphadenopathy:     Cervical: No cervical adenopathy.  Skin:    General: Skin is warm and dry.   Neurological:     Mental Status: He is alert and oriented to person, place, and time.     Cranial Nerves: No cranial nerve deficit.     Coordination: Coordination normal.     Gait: Gait normal.     Deep Tendon Reflexes: Reflexes are normal and symmetric.  Psychiatric:        Mood and Affect: Mood normal.        Behavior: Behavior normal.    ASSESSMENT and PLAN  1. Annual physical exam Routine HCM labs ordered. HCM reviewed/discussed. Anticipatory guidance regarding healthy weight, lifestyle and choices given.   2. Essential hypertension Controlled. Continue current regime. No refills needed at this time. - Lipid panel - TSH - CMP14+EGFR - CBC  3. Pure hypercholesterolemia Checking labs today, medications will be adjusted as needed.  - Lipid panel - TSH - CMP14+EGFR  4. Prediabetes Labs pending, congratulated weight loss, cont with LFM - Lipid panel - TSH - CMP14+EGFR - Hemoglobin A1c  5. Screening for STD (sexually transmitted disease) - HIV Antibody (routine testing w rflx) - Hepatitis C antibody - RPR  6. Elevated PSA Managed by urology, sees them in July  7. Macular degeneration of right eye, unspecified type - Ambulatory referral to Ophthalmology  8. Psoriasis - Ambulatory referral to Dermatology  Other orders  Return in about 6 months (around 12/13/2019).    Rutherford Guys, MD Primary Care at [place], [place] [address] Ph.  [phone] Fax [phone]

## 2019-06-14 LAB — CMP14+EGFR
ALT: 31 IU/L (ref 0–44)
AST: 19 IU/L (ref 0–40)
Albumin/Globulin Ratio: 1.9 (ref 1.2–2.2)
Albumin: 4.4 g/dL (ref 3.8–4.9)
Alkaline Phosphatase: 50 IU/L (ref 39–117)
BUN/Creatinine Ratio: 13 (ref 9–20)
BUN: 13 mg/dL (ref 6–24)
Bilirubin Total: 0.6 mg/dL (ref 0.0–1.2)
CO2: 22 mmol/L (ref 20–29)
Calcium: 9 mg/dL (ref 8.7–10.2)
Chloride: 104 mmol/L (ref 96–106)
Creatinine, Ser: 1.03 mg/dL (ref 0.76–1.27)
GFR calc Af Amer: 93 mL/min/{1.73_m2} (ref >59)
GFR calc non Af Amer: 80 mL/min/{1.73_m2} (ref >59)
Globulin, Total: 2.3 g/dL (ref 1.5–4.5)
Glucose: 110 mg/dL — ABNORMAL HIGH (ref 65–99)
Potassium: 3.9 mmol/L (ref 3.5–5.2)
Sodium: 142 mmol/L (ref 134–144)
Total Protein: 6.7 g/dL (ref 6.0–8.5)

## 2019-06-14 LAB — CBC
Hematocrit: 49.3 % (ref 37.5–51.0)
Hemoglobin: 17 g/dL (ref 13.0–17.7)
MCH: 30.1 pg (ref 26.6–33.0)
MCHC: 34.5 g/dL (ref 31.5–35.7)
MCV: 87 fL (ref 79–97)
Platelets: 245 10*3/uL (ref 150–450)
RBC: 5.65 x10E6/uL (ref 4.14–5.80)
RDW: 13.2 % (ref 11.6–15.4)
WBC: 3.9 10*3/uL (ref 3.4–10.8)

## 2019-06-14 LAB — LIPID PANEL
Chol/HDL Ratio: 3.1 ratio (ref 0.0–5.0)
Cholesterol, Total: 133 mg/dL (ref 100–199)
HDL: 43 mg/dL (ref >39)
LDL Calculated: 68 mg/dL (ref 0–99)
Triglycerides: 110 mg/dL (ref 0–149)
VLDL Cholesterol Cal: 22 mg/dL (ref 5–40)

## 2019-06-14 LAB — HEPATITIS C ANTIBODY: Hep C Virus Ab: 0.1 s/co ratio (ref 0.0–0.9)

## 2019-06-14 LAB — HIV ANTIBODY (ROUTINE TESTING W REFLEX): HIV Screen 4th Generation wRfx: NONREACTIVE

## 2019-06-14 LAB — RPR: RPR Ser Ql: NONREACTIVE

## 2019-06-14 LAB — HEMOGLOBIN A1C
Est. average glucose Bld gHb Est-mCnc: 123 mg/dL
Hgb A1c MFr Bld: 5.9 % — ABNORMAL HIGH (ref 4.8–5.6)

## 2019-06-14 LAB — TSH: TSH: 3.1 u[IU]/mL (ref 0.450–4.500)

## 2019-06-16 ENCOUNTER — Other Ambulatory Visit: Payer: Self-pay | Admitting: Family Medicine

## 2019-06-20 ENCOUNTER — Telehealth: Payer: Self-pay | Admitting: Neurology

## 2019-06-20 NOTE — Telephone Encounter (Signed)
I called to rs the patients apt but he did not answer and there was no option for VM. DW

## 2019-07-05 ENCOUNTER — Other Ambulatory Visit: Payer: Self-pay | Admitting: Family Medicine

## 2019-07-05 NOTE — Telephone Encounter (Signed)
Please advise if Dr. Pamella Pert wants to continue this rx

## 2019-07-16 ENCOUNTER — Other Ambulatory Visit: Payer: Self-pay | Admitting: Family Medicine

## 2019-07-16 NOTE — Telephone Encounter (Signed)
Requested medication (s) are due for refill today: yes  Requested medication (s) are on the active medication list: yes  Last refill: 07/05/2019  Future visit scheduled: No  Notes to clinic:  No protocol    Requested Prescriptions  Pending Prescriptions Disp Refills   fluocinonide cream (LIDEX) 0.05 % [Pharmacy Med Name: FLUOCINONIDE 0.05% CREAM 30GM] 30 g 1    Sig: APPLY SPARINGLY TO THE AFFECTED AREA TWICE DAILY     Off-Protocol Failed - 07/16/2019  3:44 AM      Failed - Medication not assigned to a protocol, review manually.      Passed - Valid encounter within last 12 months    Recent Outpatient Visits          1 month ago Annual physical exam   [facility], Lilia Argue, MD   8 months ago Prediabetes   [facility], Lilia Argue, MD   9 months ago Acute nonintractable headache, unspecified headache type   Primary Care at [facility], Ines Bloomer, MD   1 year ago Essential hypertension   Primary Care at [facility], Renette Butters, MD   1 year ago Routine physical examination   Primary Care at [facility], Renette Butters, MD

## 2019-07-24 ENCOUNTER — Other Ambulatory Visit: Payer: Self-pay

## 2019-07-24 ENCOUNTER — Ambulatory Visit: Payer: BC Managed Care – PPO | Admitting: Family Medicine

## 2019-07-24 VITALS — BP 135/92 | HR 64 | Temp 98.5°F | Ht 66.0 in | Wt 182.8 lb

## 2019-07-24 DIAGNOSIS — R1012 Left upper quadrant pain: Secondary | ICD-10-CM | POA: Insufficient documentation

## 2019-07-24 MED ORDER — HYDROCHLOROTHIAZIDE 12.5 MG PO TABS: ORAL_TABLET | ORAL | 0 refills | Status: DC

## 2019-07-24 NOTE — Patient Instructions (Signed)
Gastro referral-will call for appt time CT abdomin -scheduling

## 2019-07-24 NOTE — Progress Notes (Signed)
Acute Office Visit  Subjective:    Patient ID: Rickey Cruz, male    DOB: 1961/01/06, 58 y.o.   MRN: [id number]  Chief Complaint  Patient presents with  . Abdominal Pain  cc: Patient is complaining of left side and abdominal pain, he has had some nausea vomiting and diarrhea intermittently this has been occurring for years but now getting worse. He states that the area gets hot inside and that's when the nausea and pain starts  HPI Patient is in today for  Abdominal pain-noted 1+ years ago-increase in intensity over the past few weeks. Pt describes as knife like-sharp pain.  Pt states abdominal pain woke pt from sleep last week.  Pain symptoms lasted more than 5 min. Pt states pain would not improve with walking or postional change. Day prior to  unexpected diarrhea was when pt was abruptly awaken from sleep.  Omeprazole in the past for GERD. Colonoscopy /EGD-2016  Past Medical History:  Diagnosis Date  . Allergy    Flonase, Patanol, Zyrtec  . Arthritis    psoriasis  . Asthma    seasonal, with allergies  . Borderline diabetes   . Cluster headache   . Colon polyp 10/27/2011  . Depression    denies 11/11/16; denies 11/21/18 "been years"  . GERD (gastroesophageal reflux disease)   . History of kidney stones    x1  . Hyperlipidemia   . Hypertension   . Low back pain    "in kidney area-was told that it was related to gallstones"  . Prediabetes   . Psoriasis    [name]  . Severe sleep apnea    h/o; had procedure and weight control, does not have to wear CPAP  . 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 APPENDECTOMY    . CHOLECYSTECTOMY N/A 12/16/2014   Procedure: LAPAROSCOPIC CHOLECYSTECTOMY WITH INTRAOPERATIVE CHOLANGIOGRAM;  Surgeon: [name], MD;  Location: [facility];  Service: General;  Laterality: N/A;  . COLONOSCOPY  10/27/2011   single polyp. Repeat 5 years.  Marland Kitchen POLYPECTOMY    .  PROSTATE BIOPSY  10/2014   will have another in 3 months  . SEPTOPLASTY  2014  . TONSILLECTOMY  2014  . UVULOPALATOPHARYNGOPLASTY  2014    Family History  Problem Relation Age of Onset  . Colon cancer Neg Hx   . Esophageal cancer Neg Hx   . Stomach cancer Neg Hx   . Prostate cancer Neg Hx   . Diabetes Neg Hx   . CAD Neg Hx   . Migraines Neg Hx   . Headache Neg Hx     Social History   Socioeconomic History  . Marital status: Divorced    Spouse name: Not on file  . Number of children: 1  . Years of education: Not on file  . Highest education level: Doctorate  Occupational History  . Occupation: Product manager: [employer]    Comment: Pharmacist, community  Social Needs  . Financial resource strain: Not on file  . Food insecurity    Worry: Not on file    Inability: Not on file  . Transportation needs    Medical: Not on file    Non-medical: Not on file  Tobacco Use  . Smoking status: Never Smoker  . Smokeless tobacco: Never Used  Substance and Sexual Activity  . Alcohol use: Never    Alcohol/week:  0.0 standard drinks    Frequency: Never  . Drug use: Never  . Sexual activity: Yes    Comment: SSP  Lifestyle  . Physical activity    Days per week: Not on file    Minutes per session: Not on file  . Stress: Not on file  Relationships  . Social Herbalist on phone: Not on file    Gets together: Not on file    Attends religious service: Not on file    Active member of club or organization: Not on file    Attends meetings of clubs or organizations: Not on file    Relationship status: Not on file  . Intimate partner violence    Fear of current or ex partner: Not on file    Emotionally abused: Not on file    Physically abused: Not on file    Forced sexual activity: Not on file  Other Topics Concern  . Not on file  Social History Narrative   Original from [place]; moved to [place] 1995.   Marital status: same sexual partner 4 X years.    Children:  1 child/son (18) at 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        Tobacco: never      Alcohol: none      Drugs: none       Exercise: daily in 2018; 10,000 steps daily; lots of walking on campus at work      ADLs: independent with ADLs.        Sexual activity: dating males only in 2018; bisexual.  Previously married to male.       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          Outpatient Medications Prior to Visit  Medication Sig Dispense Refill  . albuterol (PROAIR HFA) 108 (90 Base) MCG/ACT inhaler Inhale 2 puffs into the lungs every 4 (four) hours as needed for wheezing or shortness of breath. 1 Inhaler 1  . amLODipine (NORVASC) 5 MG tablet TAKE 1 TABLET(5 MG) BY MOUTH DAILY 30 tablet 1  . beclomethasone (QVAR) 80 MCG/ACT inhaler Inhale 2 puffs into the lungs 2 (two) times daily. 3 Inhaler 3  . clobetasol ointment (TEMOVATE) 2.70 % Apply 1 application topically 2 (two) times daily.    . clobetasol ointment (TEMOVATE) 0.05 % APPLY EXTERNALLY TO THE AFFECTED AREA TWICE DAILY 30 g 0  . Continuous Blood Gluc Sensor (FREESTYLE LIBRE SENSOR SYSTEM) MISC 1 each by Does not apply route every 14 (fourteen) days. 1 each 10  . fluocinolone (VANOS) 0.01 % cream APPLY EXTERNALLY TO THE AFFECTED AREA TWICE DAILY 30 g 1  . fluocinonide cream (LIDEX) 0.05 % APPLY SPARINGLY TO THE AFFECTED AREA TWICE DAILY 30 g 1  . fluticasone (FLONASE) 50 MCG/ACT nasal spray SHAKE LIQUID AND USE 2 SPRAYS IN EACH NOSTRIL EVERY DAY 48 g 3  . fluticasone (FLOVENT HFA) 110 MCG/ACT inhaler Inhale 2 puffs into the  lungs 2 (two) times daily. 3 Inhaler 4  . hydrochlorothiazide (HYDRODIURIL) 12.5 MG tablet TAKE 1 TABLET(12.5 MG) BY MOUTH DAILY 90 tablet 0  .  ketoconazole (NIZORAL) 2 % shampoo APPLY EXTERNALLY 2 TIMES A WEEK. 120 mL 3  . montelukast (SINGULAIR) 10 MG tablet TAKE 1 TABLET(10 MG) BY MOUTH AT BEDTIME 90 tablet 3  . rosuvastatin (CRESTOR) 10 MG tablet TAKE 1 TABLET BY MOUTH DAILY 90 tablet 0   No facility-administered medications prior to visit.     No Known Allergies  Review of Systems  Constitutional: Negative for fever and weight loss.  Gastrointestinal: Positive for abdominal pain and diarrhea. Negative for nausea and vomiting.  Neurological: Positive for dizziness.       Objective:    Physical Exam  Constitutional: He appears well-developed and well-nourished. He appears distressed.  HENT:  Head: Normocephalic and atraumatic.  Cardiovascular: Normal rate and regular rhythm.  Pulmonary/Chest: Effort normal and breath sounds normal.  Abdominal: Soft. Bowel sounds are normal. He exhibits no distension and no mass. There is abdominal tenderness. There is no rebound and no guarding.    BP (!) 135/92 (BP Location: Left Arm, Patient Position: Sitting, Cuff Size: Normal)   Pulse 64   Temp 98.5 F (36.9 C) (Oral)   Ht 5\' 6"  (1.676 m)   Wt 182 lb 12.8 oz (82.9 kg)   SpO2 96%   BMI 29.50 kg/m  Wt Readings from Last 3 Encounters:  07/24/19 182 lb 12.8 oz (82.9 kg)  06/13/19 178 lb (80.7 kg)  11/21/18 186 lb (84.4 kg)    Lab Results  Component Value Date   TSH 3.100 06/13/2019   Lab Results  Component Value Date   WBC 3.9 06/13/2019   HGB 17.0 06/13/2019   HCT 49.3 06/13/2019   MCV 87 06/13/2019   PLT 245 06/13/2019   Lab Results  Component Value Date   NA 142 06/13/2019   K 3.9 06/13/2019   CO2 22 06/13/2019   GLUCOSE 110 (H) 06/13/2019   BUN 13 06/13/2019   CREATININE 1.03 06/13/2019   BILITOT 0.6 06/13/2019   ALKPHOS 50 06/13/2019   AST 19 06/13/2019   ALT  31 06/13/2019   PROT 6.7 06/13/2019   ALBUMIN 4.4 06/13/2019   CALCIUM 9.0 06/13/2019   ANIONGAP 13 12/15/2014   Lab Results  Component Value Date   CHOL 133 06/13/2019   Lab Results  Component Value Date   HDL 43 06/13/2019   Lab Results  Component Value Date   LDLCALC 68 06/13/2019   Lab Results  Component Value Date   TRIG 110 06/13/2019   Lab Results  Component Value Date   CHOLHDL 3.1 06/13/2019   Lab Results  Component Value Date   HGBA1C 5.9 (H) 06/13/2019       Assessment & Plan:  1. Abdominal pain, left upper quadrant Pt with ongoing pain left upper quad-request imagining for evaluation-increase intensity and frequency of abdominal symptoms. No change in bowel movements-reviewed results of colonoscopy and EGD with pt from previous. Recommend Gastro-discussed with pt.  - Ambulatory referral to Gastroenterology - CT Abdomen Pelvis W Contrast; Future - CT Abdomen Pelvis W Contrast LISA Hannah Beat, MD

## 2019-07-26 ENCOUNTER — Other Ambulatory Visit: Payer: BC Managed Care – PPO

## 2019-08-01 ENCOUNTER — Ambulatory Visit: Payer: BC Managed Care – PPO | Admitting: Family Medicine

## 2019-08-13 ENCOUNTER — Other Ambulatory Visit: Payer: Self-pay | Admitting: Family Medicine

## 2019-08-13 NOTE — Telephone Encounter (Signed)
Requested Prescriptions  Pending Prescriptions Disp Refills  . amLODipine (NORVASC) 5 MG tablet [Pharmacy Med Name: AMLODIPINE BESYLATE 5MG  TABLETS] 90 tablet 1    Sig: TAKE 1 TABLET(5 MG) BY MOUTH DAILY     Cardiovascular:  Calcium Channel Blockers Failed - 08/13/2019 11:42 AM      Failed - Last BP in normal range    BP Readings from Last 1 Encounters:  07/24/19 (!) 135/92         Passed - Valid encounter within last 6 months    Recent Outpatient Visits          2 weeks ago Abdominal pain, left upper quadrant   Primary Care at [facility], Rex Kras, MD   2 months ago Annual physical exam   [facility], Lilia Argue, MD   9 months ago Prediabetes   [facility] at [facility], Lilia Argue, MD   10 months ago Acute nonintractable headache, unspecified headache type   Primary Care at [facility], Ines Bloomer, MD   1 year ago Essential hypertension   Primary Care at [facility], Renette Butters, MD

## 2019-08-15 ENCOUNTER — Other Ambulatory Visit: Payer: Self-pay | Admitting: Family Medicine

## 2019-08-15 NOTE — Telephone Encounter (Signed)
Spoke with pt. He will see Amy NP for this f/u, scheduled same day 8/27 Thurs now 9 AM arrival 15-30 minutes early. He will bring a mask and understands temp will be taking and he will be asked covid19 screening questions at the door.

## 2019-08-16 MED ORDER — ROSUVASTATIN CALCIUM 10 MG PO TABS: ORAL_TABLET | ORAL | 0 refills | Status: DC

## 2019-08-20 ENCOUNTER — Encounter: Payer: Self-pay | Admitting: Nurse Practitioner

## 2019-08-20 ENCOUNTER — Ambulatory Visit: Payer: BC Managed Care – PPO | Admitting: Nurse Practitioner

## 2019-08-20 ENCOUNTER — Other Ambulatory Visit (INDEPENDENT_AMBULATORY_CARE_PROVIDER_SITE_OTHER): Payer: BC Managed Care – PPO

## 2019-08-20 VITALS — BP 130/85 | HR 93 | Temp 98.1°F | Ht 65.0 in | Wt 181.0 lb

## 2019-08-20 DIAGNOSIS — R109 Unspecified abdominal pain: Secondary | ICD-10-CM | POA: Diagnosis not present

## 2019-08-20 DIAGNOSIS — R112 Nausea with vomiting, unspecified: Secondary | ICD-10-CM

## 2019-08-20 DIAGNOSIS — R194 Change in bowel habit: Secondary | ICD-10-CM

## 2019-08-20 LAB — BASIC METABOLIC PANEL
BUN: 13 mg/dL (ref 6–23)
CO2: 31 mEq/L (ref 19–32)
Calcium: 9.5 mg/dL (ref 8.4–10.5)
Chloride: 100 mEq/L (ref 96–112)
Creatinine, Ser: 1.09 mg/dL (ref 0.40–1.50)
GFR: 69.44 mL/min (ref >60.00)
Glucose, Bld: 112 mg/dL — ABNORMAL HIGH (ref 70–99)
Potassium: 3.2 mEq/L — ABNORMAL LOW (ref 3.5–5.1)
Sodium: 140 mEq/L (ref 135–145)

## 2019-08-20 MED ORDER — HYOSCYAMINE SULFATE 0.125 MG SL SUBL: 0.1250 mg | SUBLINGUAL_TABLET | Freq: Three times a day (TID) | SUBLINGUAL | 0 refills | Status: DC | PRN

## 2019-08-20 NOTE — Progress Notes (Signed)
° ° ° ° ° °Chief Complaint:   Abdominal pain  ° °IMPRESSION and PLAN:   ° ° 58-year-old male with one month history of left mid abdomen / left waist pain radiating around to left lower back with associated nausea and altered bowel habits.  PCP had scheduled a CT scan but this was canceled by someone per patient.  Etiology of these episodes is unclear.  He is very concerned about underlying pathology.  °-I tried to reassure the patient.  His weight is stable, abdominal exam is not overly concerning.  CMET, CBC in June were unremarkable.  He may be developing IBS.  °-Given duration of symptoms and patient's concern will proceed with CTAP. Check renal function prior to scan.  °-Daily Benefiber °-Trial of sublingual Levsin as needed for episodes of abdominal pain. ° ° °HPI:   ° ° °Patient is a is a 58-year-old with a PMH significant for asthma, arthritis, hypertension, hyperlipidemia, GERD and colon polyps.  He is known to Dr. Jacobs.  In October 2017 patient was seen at [facility] for evaluation of chest /epigastric pain. Chest CT scan suggested a patulous thoracic esophagus with abnormal course lateral to the thoracic °aorta. Patient then came to see us later that month to ask if EGD could be added to the surveillance he was already scheduled for. We started him on omeprazole 40 mg daily and he had both EGD / colonoscopy 11/11/2016 . EGD was normal except for gastritis.  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  Colonoscopy was normal, recommended 10-year colonoscopy ° °Christy comes in today for evaluation of abdominal pain.  Two  years ago he developed a "warm" discomfort of left flank.  The sensation never went away, he just learned to ignore it.  Then, a month ago he was awoken from sleep with sharp pain at left waist radiating around to left lower back with associated nausea and loose stool. Over the last month these episodes have continued to occur without regard to food. He saw  PCP late July but pain was documented as LUQ at that time.  A CT scan was ordered but patient says someone canceled it, maybe radiology department.  He has continued to have intermittent episodes of pain with associated nausea and loose stool.  When not loose his stools are normal or he is constipated.  He has not had any rectal bleeding.  He has lost some weight attributed to diet and walking.  ° °Data Reviewed:  °Labs 06/13/2019: °C-Met normal °CBC normal.  ° °Review of systems:     No chest pain, no SOB, no fevers, no urinary sx  ° °Past Medical History:  °Diagnosis Date  °• Allergy   ° Flonase, Patanol, Zyrtec  °• Arthritis   ° psoriasis  °• Asthma   ° seasonal, with allergies  °• Borderline diabetes   °• Cluster headache   °• Cluster headaches   ° 2019  °• Colon polyp 10/27/2011  °• Depression   ° denies 11/11/16; denies 11/21/18 "been years"  °• GERD (gastroesophageal reflux disease)   °• History of kidney stones   ° x1  °• Hyperlipidemia   °• Hypertension   °• Low back pain   ° "in kidney area-was told that it was related to gallstones"  °• Prediabetes   °• Psoriasis   ° [name]  °• Severe sleep apnea   ° h/o; had procedure and weight control, does not have to wear CPAP  °• Tuberculosis   °   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 1 Inhaler 1   amLODipine (NORVASC) 5 MG tablet TAKE 1 TABLET(5 MG) BY MOUTH DAILY 90 tablet 1   beclomethasone (QVAR) 80 MCG/ACT inhaler Inhale 2 puffs into the lungs 2 (two) times daily. 3 Inhaler 3   clobetasol ointment (TEMOVATE) 4.50 % Apply 1 application topically 2 (two) times daily.     Continuous Blood Gluc  Sensor (FREESTYLE LIBRE SENSOR SYSTEM) MISC 1 each by Does not apply route every 14 (fourteen) days. 1 each 10   fluocinolone (VANOS) 0.01 % cream APPLY EXTERNALLY TO THE AFFECTED AREA TWICE DAILY 30 g 1   fluocinonide cream (LIDEX) 0.05 % APPLY SPARINGLY TO THE AFFECTED AREA TWICE DAILY 30 g 1   fluticasone (FLONASE) 50 MCG/ACT nasal spray SHAKE LIQUID AND USE 2 SPRAYS IN EACH NOSTRIL EVERY DAY 48 g 3   fluticasone (FLOVENT HFA) 110 MCG/ACT inhaler Inhale 2 puffs into the lungs 2 (two) times daily. 3 Inhaler 4   hydrochlorothiazide (HYDRODIURIL) 12.5 MG tablet Nijee Floor 90 tablet 0   ketoconazole (NIZORAL) 2 % shampoo APPLY EXTERNALLY 2 TIMES A WEEK. 120 mL 3   montelukast (SINGULAIR) 10 MG tablet TAKE 1 TABLET(10 MG) BY MOUTH AT BEDTIME 90 tablet 3   rosuvastatin (CRESTOR) 10 MG tablet TAKE 1 TABLET BY MOUTH DAILY 90 tablet 0   No current facility-administered medications for this visit.     Physical Exam:     BP 130/85 (BP Location: Right Arm, Patient Position: Sitting, Cuff Size: Normal)    Pulse 93    Temp 98.1 F (36.7 C)    Ht 5' 5" (1.651 m)    Wt 181 lb (82.1 kg)    BMI 30.12 kg/m   GENERAL:  Pleasant Hispanic male in NAD PSYCH: : Cooperative, normal affect EENT:  conjunctiva pink, mucous membranes moist, neck supple without masses CARDIAC:  RRR,  no peripheral edema PULM: Normal respiratory effort, lungs CTA bilaterally, no wheezing ABDOMEN:  Nondistended, soft, mild left mid abdominal tenderness. No obvious masses, no hepatomegaly,  normal bowel sounds SKIN:  turgor, no lesions seen Musculoskeletal:  Normal muscle tone, normal strength NEURO: Alert and oriented x 3, no focal neurologic deficits   Tye Savoy , NP 08/20/2019, 11:10 AM

## 2019-08-20 NOTE — Patient Instructions (Addendum)
If you are age 58 or older, your body mass index should be between 23-30. Your Body mass index is 30.12 kg/m. If this is out of the aforementioned range listed, please consider follow up with your Primary Care Provider.  If you are age 59 or younger, your body mass index should be between 19-25. Your Body mass index is 30.12 kg/m. If this is out of the aformentioned range listed, please consider follow up with your Primary Care Provider.   We have sent the following medications to your pharmacy for you to pick up at your convenience: [facility] [place] [facility] provider has requested that you go to the basement level for lab work before leaving today. Press "B" on the elevator. The lab is located at the first door on the left as you exit the elevator. BMET  You have been scheduled for a CT scan of the abdomen and pelvis at [facility] ([address]---this is in the same building as Press [facility]).   You are scheduled on 09/09/19 at 3 pm. You should arrive 15 minutes prior to your appointment time for registration. Please follow the written instructions below on the day of your exam:  WARNING: IF YOU ARE ALLERGIC TO IODINE/X-RAY DYE, PLEASE NOTIFY RADIOLOGY IMMEDIATELY AT [phone]! YOU WILL BE GIVEN A 13 HOUR PREMEDICATION PREP.  1) Do not eat anything after 11 am (4 hours prior to your test) 2) You have been given 2 bottles of oral contrast to drink. The solution may taste better if refrigerated, but do NOT add ice or any other liquid to this solution. Shake well before drinking.  Drink 1 bottle of contrast @ 1 pm  (2 hours prior to your exam)  Drink 1 bottle of contrast @ 2 pm  (1 hour prior to your exam)  You may take any medications as prescribed with a small amount of water, if necessary. If you take any of the following medications: METFORMIN, GLUCOPHAGE, GLUCOVANCE, AVANDAMET, RIOMET, FORTAMET, [name], JANUMET, GLUMETZA or METAGLIP, you MAY be asked to HOLD this medication  48 hours AFTER the exam.  The purpose of you drinking the oral contrast is to aid in the visualization of your intestinal tract. The contrast solution may cause some diarrhea. Depending on your individual set of symptoms, you may also receive an intravenous injection of x-ray contrast/dye. Plan on being at [facility] for 30 minutes or longer, depending on the type of exam you are having performed.  This test typically takes 30-45 minutes to complete.  If you have any questions regarding your exam or if you need to reschedule, you may call the CT department at [phone] between the hours of 8:00 am and 5:00 pm, Monday-Friday.  ______________________________________________________________ Rickey Cruz every day (this is over-the-counter)  Follow up with me on 09/11/19 at 11 am.  Thank you for choosing me and [facility].   Rickey Savoy, NP

## 2019-08-21 NOTE — Progress Notes (Signed)
PATIENT: Rickey Cruz DOB: 18-May-1961  REASON FOR VISIT: follow up HISTORY FROM: patient  Chief Complaint  Patient presents with   Follow-up    Room 6, alone. "a few headaches, just takes a aspirin or Ibuprofen and wait for it to pass"   Migraine     HISTORY OF PRESENT ILLNESS: Today 08/22/19 Rickey Cruz is a 58 y.o. male here today for follow up for headaches. MRI/MRA normal in 11/2018. He reports that headaches continue about 1-2 times a week. They are no where close to being as severe as the one he had in 10/2018. Over the past three weeks he has had an increase in his headaches. He has started a new low carb diet and feels this may be related. Headache is easily treated with Tylenol or ibuprofen. He denies regular use of OTC analgesics. He drinks 1-2 bottles of water daily. He is working on lowering blood pressure with weight loss. He does not want to take any more medications. He does endorse allergy symptoms of runny nose and watery eyes.   HISTORY: (copied from Dr Cathren Laine note on 11/21/2018)  HPI:  Rickey Cruz is a 58 y.o. male here as requested by Dr. Mitchel Honour for headache. PMHx HTN, HLD, prediabetes. He has had 2 severe headaches. First started a year ago. In October this year he had more headaches "out of the blue". He measured his blood pressure. He was eating well. It woke him up at 3am. The headaches are frontal. Lasts 1-2 hours. 3-4 ibuprofen helped. He had soreness afterwards. It went on for a week then stopped. It would always wake hi in th middle of the night and sometimes during the day. It is severe sharp, he doesn't remember if he had autonomic symptoms. No FHx of cluster headaches.  The headache cycle lasted for 2 weeks and then improved.  The headaches often woke him up in the middle of the night.  Headaches were extremely severe.  Unclear triggers.  No other migrainous symptoms.  No other focal neurologic deficits, associated symptoms, inciting events or modifiable  factors.  His son may have migraines.  Reviewed notes, labs and imaging from outside physicians, which showed:  Reviewed Dr. Barry Brunner notes.  This is a 58 year old male who was seen for headaches for 2 weeks.  Started with soreness in the mid forehead area and then it spread to the entire head feels like pressure.  Takes aspirin or Tylenol ibuprofen with some relief.  No photophobia.  Feels nauseated but no vomiting.  He has some sweating.  No teary eyes or runny nose.  No history of migraine headaches.  Non-smoker no alcohol use.  Initially had some dizziness and some vertigo but that went away.  Denied syncopal event.  He was given Fiorinal at onset.  Reviewed CBC C and CMP: Elevated glucose otherwise BUN and creatinine normal.  Sed rate normal.  HIV negative.   REVIEW OF SYSTEMS: Out of a complete 14 system review of symptoms, the patient complains only of the following symptoms, headaches intermittent headaches and all other reviewed systems are negative.  ALLERGIES: No Known Allergies  HOME MEDICATIONS: Outpatient Medications Prior to Visit  Medication Sig Dispense Refill   albuterol (PROAIR HFA) 108 (90 Base) MCG/ACT inhaler Inhale 2 puffs into the lungs every 4 (four) hours as needed for wheezing or shortness of breath. 1 Inhaler 1   amLODipine (NORVASC) 5 MG tablet TAKE 1 TABLET(5 MG) BY MOUTH DAILY 90 tablet 1   aspirin  EC 325 MG tablet Take 325 mg by mouth as needed.     beclomethasone (QVAR) 80 MCG/ACT inhaler Inhale 2 puffs into the lungs 2 (two) times daily. 3 Inhaler 3   clobetasol ointment (TEMOVATE) [id number] % Apply 1 application topically 2 (two) times daily.     Continuous Blood Gluc Sensor (FREESTYLE LIBRE SENSOR SYSTEM) MISC 1 each by Does not apply route every 14 (fourteen) days. 1 each 10   fluocinolone (VANOS) 0.01 % cream APPLY EXTERNALLY TO THE AFFECTED AREA TWICE DAILY 30 g 1   fluocinonide cream (LIDEX) 0.05 % APPLY SPARINGLY TO THE AFFECTED AREA TWICE  DAILY 30 g 1   fluticasone (FLONASE) 50 MCG/ACT nasal spray SHAKE LIQUID AND USE 2 SPRAYS IN EACH NOSTRIL EVERY DAY 48 g 3   fluticasone (FLOVENT HFA) 110 MCG/ACT inhaler Inhale 2 puffs into the lungs 2 (two) times daily. 3 Inhaler 4   hydrochlorothiazide (HYDRODIURIL) 12.5 MG tablet Kiwan Stiff 90 tablet 0   hyoscyamine (LEVSIN SL) 0.125 MG SL tablet Place 1 tablet (0.125 mg total) under the tongue every 8 (eight) hours as needed. 30 tablet 0   ibuprofen (ADVIL) 100 MG tablet Take 100 mg by mouth daily as needed for fever. OTC     ketoconazole (NIZORAL) 2 % shampoo APPLY EXTERNALLY 2 TIMES A WEEK. 120 mL 3   montelukast (SINGULAIR) 10 MG tablet TAKE 1 TABLET(10 MG) BY MOUTH AT BEDTIME 90 tablet 3   Multiple Vitamin (MULTIVITAMIN) capsule Take 1 capsule by mouth daily. OTC     rosuvastatin (CRESTOR) 10 MG tablet TAKE 1 TABLET BY MOUTH DAILY 90 tablet 0   No facility-administered medications prior to visit.     PAST MEDICAL HISTORY: Past Medical History:  Diagnosis Date   Allergy    Flonase, Patanol, Zyrtec   Arthritis    psoriasis   Asthma    seasonal, with allergies   Borderline diabetes    Cluster headache    Colon polyp 10/27/2011   Depression    denies 11/11/16; denies 11/21/18 "been years"   GERD (gastroesophageal reflux disease)    History of kidney stones    x1   Hyperlipidemia    Hypertension    Low back pain    "in kidney area-was told that it was related to gallstones"   Prediabetes    Psoriasis    [name]   Severe sleep apnea    h/o; had procedure and weight control, does not have to wear CPAP   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 Repeat 5 years.   POLYPECTOMY     PROSTATE BIOPSY  10/2014   will have another in 3 months   SEPTOPLASTY  2014   TONSILLECTOMY  2014   UVULOPALATOPHARYNGOPLASTY  2014    FAMILY HISTORY: Family History  Problem Relation Age of Onset   Colon cancer Neg Hx    Esophageal cancer Neg Hx    Stomach cancer Neg Hx    Prostate cancer Neg Hx    Diabetes Neg Hx    CAD Neg Hx    Migraines Neg Hx    Headache Neg Hx  SOCIAL HISTORY: Social History   Socioeconomic History   Marital status: Divorced    Spouse name: Not on file   Number of children: 1   Years of education: Not on file   Highest education level: Doctorate  Occupational History   Occupation: Product manager: [employer]    Comment: [place] [employer]   Financial resource strain: Not on file   Food insecurity    Worry: Not on file    Inability: Not on file   Transportation needs    Medical: Not on file    Non-medical: Not on file  Tobacco Use   Smoking status: Never Smoker   Smokeless tobacco: Never Used  Substance and Sexual Activity   Alcohol use: Never    Alcohol/week: 0.0 standard drinks    Frequency: Never   Drug use: Never   Sexual activity: Yes    Comment: SSP  Lifestyle   Physical activity    Days per week: Not on file    Minutes per session: Not on file   Stress: Not on file  Relationships   Social connections    Talks on phone: Not on file    Gets together: Not on file    Attends religious service: Not on file    Active member of club or organization: Not on file    Attends meetings of clubs or organizations: Not on file    Relationship status: Not on file   Intimate partner violence    Fear of current or ex partner: Not on file    Emotionally abused: Not on file    Physically abused: Not on file    Forced sexual activity: Not on file  Other Topics Concern   Not on file    Social History Narrative   Original from [place]; moved to [place] 1995.   Marital status: same sexual partner 4 X years.   Children:  1 child/son (18) at 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        Tobacco: never      Alcohol: none      Drugs: none       Exercise: daily in 2018; 10,000 steps daily; lots of walking on campus at work      ADLs: independent with ADLs.        Sexual activity: dating males only in 2018; bisexual.  Previously married to male.       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            PHYSICAL EXAM  Vitals:   08/22/19 0902  BP: (!) 130/96  Pulse: 84  Temp: (!) 97.3 F (36.3 C)  Weight: 181 lb 9.6 oz (82.4 kg)  Height: 5\' 5"  (1.651 m)   Body mass index is 30.22 kg/m.  Generalized: Well developed, in no acute distress  Cardiology: normal rate and rhythm, no murmur noted Neurological examination  Mentation: Alert oriented to time, place, history taking. Follows all commands speech and language fluent Cranial nerve II-XII: Pupils were equal round reactive to light. Extraocular movements were full, visual field were  full on confrontational test. Facial sensation and strength were normal. Uvula tongue midline. Head turning and shoulder shrug  were normal and symmetric. Motor: The motor testing reveals 5 over 5 strength of all 4 extremities. Good symmetric motor tone is noted throughout.  Coordination: Cerebellar testing reveals good finger-nose-finger and heel-to-shin bilaterally.  Gait and station: Gait is normal.   DIAGNOSTIC DATA (LABS, IMAGING,  TESTING) - I reviewed patient records, labs, notes, testing and imaging myself where available.  No flowsheet data found.   Lab Results  Component Value Date   WBC 3.9 06/13/2019   HGB 17.0 06/13/2019   HCT 49.3 06/13/2019   MCV 87 06/13/2019   PLT 245 06/13/2019      Component Value Date/Time   NA 140 08/20/2019 1210   NA 142 06/13/2019 0839   K 3.2 (L) 08/20/2019 1210   CL 100 08/20/2019 1210   CO2 31 08/20/2019 1210   GLUCOSE 112 (H) 08/20/2019 1210   BUN 13 08/20/2019 1210   BUN 13 06/13/2019 0839   CREATININE 1.09 08/20/2019 1210   CREATININE 1.04 09/21/2016 1119   CALCIUM 9.5 08/20/2019 1210   PROT 6.7 06/13/2019 0839   ALBUMIN 4.4 06/13/2019 0839   AST 19 06/13/2019 0839   ALT 31 06/13/2019 0839   ALKPHOS 50 06/13/2019 0839   BILITOT 0.6 06/13/2019 0839   GFRNONAA 80 06/13/2019 0839   GFRNONAA 81 09/09/2015 0912   GFRAA 93 06/13/2019 0839   GFRAA >89 09/09/2015 0912   Lab Results  Component Value Date   CHOL 133 06/13/2019   HDL 43 06/13/2019   LDLCALC 68 06/13/2019   TRIG 110 06/13/2019   CHOLHDL 3.1 06/13/2019   Lab Results  Component Value Date   HGBA1C 5.9 (H) 06/13/2019   Lab Results  Component Value Date   VITAMINB12 469 11/21/2018   Lab Results  Component Value Date   TSH 3.100 06/13/2019     ASSESSMENT AND PLAN 58 y.o. year old male  has a past medical history of Allergy, Arthritis, Asthma, Borderline diabetes, Cluster headache, Colon polyp (10/27/2011), Depression, GERD (gastroesophageal reflux disease), History of kidney stones, Hyperlipidemia, Hypertension, Low back pain, Prediabetes, Psoriasis, Severe sleep apnea, and Tuberculosis. here with     ICD-10-CM   1. Episodic cluster headache, not intractable  G44.019     Heaven has been doing very well since last being seen.  He has noted a slight increase in the amount of headaches he has had recently, however, contributes this to weather changes.  He does have a history of allergies.  He  is currently on Singulair but no antihistamine.  I have recommended that he consider an antihistamine over-the-counter as directed by packaging.  I would also like for him to increase his water intake.  I suggested a minimum of 50 ounces daily.  He may continue Tylenol or ibuprofen as needed for headache but I have encouraged not to use this regularly.  We have discussed treatment for potential cluster headaches with [facility].  He is not interested in adding any medications at this time.  He will keep a close eye on his blood pressure and continue healthy lifestyle changes.  We will follow-up as needed.  He verbalizes understanding and agreement with this plan.   No orders of the defined types were placed in this encounter.    No orders of the defined types were placed in this encounter.     I spent 15 minutes with the patient. 50% of this time  was spent counseling and educating patient on plan of care and medications.    Debbora Presto, FNP-C 08/22/2019, 1:11 PM [facility] [address] [place], [place] [address] [phone]

## 2019-08-21 NOTE — Progress Notes (Signed)
I agree with the above note, plan 

## 2019-08-22 ENCOUNTER — Other Ambulatory Visit: Payer: Self-pay

## 2019-08-22 ENCOUNTER — Ambulatory Visit: Payer: BC Managed Care – PPO | Admitting: Family Medicine

## 2019-08-22 ENCOUNTER — Encounter: Payer: Self-pay | Admitting: Family Medicine

## 2019-08-22 VITALS — BP 130/96 | HR 84 | Temp 97.3°F | Ht 65.0 in | Wt 181.6 lb

## 2019-08-22 DIAGNOSIS — G44019 Episodic cluster headache, not intractable: Secondary | ICD-10-CM

## 2019-08-22 NOTE — Patient Instructions (Signed)
Try either Zyrtec, Claritin, Xyzal or Allergra per packaging instructions for allergy symptoms  Please continue healthy lifestyle change with low sodium, low glycemic diet  Add regular exercise and meditation/relaxation techniques  We will consider Emgality in the future for headache management   Follow up as needed     Healthy Eating Following a healthy eating pattern may help you to achieve and maintain a healthy body weight, reduce the risk of chronic disease, and live a long and productive life. It is important to follow a healthy eating pattern at an appropriate calorie level for your body. Your nutritional needs should be met primarily through food by choosing a variety of nutrient-rich foods. What are tips for following this plan? Reading food labels  Read labels and choose the following: ? Reduced or low sodium. ? Juices with 100% fruit juice. ? Foods with low saturated fats and high polyunsaturated and monounsaturated fats. ? Foods with whole grains, such as whole wheat, cracked wheat, brown rice, and wild rice. ? Whole grains that are fortified with folic acid. This is recommended for women who are pregnant or who want to become pregnant.  Read labels and avoid the following: ? Foods with a lot of added sugars. These include foods that contain brown sugar, corn sweetener, corn syrup, dextrose, fructose, glucose, high-fructose corn syrup, honey, invert sugar, lactose, malt syrup, maltose, molasses, raw sugar, sucrose, trehalose, or turbinado sugar.  Do not eat more than the following amounts of added sugar per day:  6 teaspoons (25 g) for women.  9 teaspoons (38 g) for men. ? Foods that contain processed or refined starches and grains. ? Refined grain products, such as white flour, degermed cornmeal, white bread, and white rice. Shopping  Choose nutrient-rich snacks, such as vegetables, whole fruits, and nuts. Avoid high-calorie and high-sugar snacks, such as potato  chips, fruit snacks, and candy.  Use oil-based dressings and spreads on foods instead of solid fats such as butter, stick margarine, or cream cheese.  Limit pre-made sauces, mixes, and "instant" products such as flavored rice, instant noodles, and ready-made pasta.  Try more plant-protein sources, such as tofu, tempeh, black beans, edamame, lentils, nuts, and seeds.  Explore eating plans such as the Mediterranean diet or vegetarian diet. Cooking  Use oil to saut or stir-fry foods instead of solid fats such as butter, stick margarine, or lard.  Try baking, boiling, grilling, or broiling instead of frying.  Remove the fatty part of meats before cooking.  Steam vegetables in water or broth. Meal planning   At meals, imagine dividing your plate into fourths: ? One-half of your plate is fruits and vegetables. ? One-fourth of your plate is whole grains. ? One-fourth of your plate is protein, especially lean meats, poultry, eggs, tofu, beans, or nuts.  Include low-fat dairy as part of your daily diet. Lifestyle  Choose healthy options in all settings, including home, work, school, restaurants, or stores.  Prepare your food safely: ? Wash your hands after handling raw meats. ? Keep food preparation surfaces clean by regularly washing with hot, soapy water. ? Keep raw meats separate from ready-to-eat foods, such as fruits and vegetables. ? Cook seafood, meat, poultry, and eggs to the recommended internal temperature. ? Store foods at safe temperatures. In general:  Keep cold foods at 44F (4.4C) or below.  Keep hot foods at 144F (60C) or above.  Keep your freezer at [facility] (-17.8C) or below.  Foods are no longer safe to eat when they have  been between the temperatures of 40-140F (4.4-60C) for more than 2 hours. What foods should I eat? Fruits Aim to eat 2 cup-equivalents of fresh, canned (in natural juice), or frozen fruits each day. Examples of 1 cup-equivalent of fruit  include 1 small apple, 8 large strawberries, 1 cup canned fruit,  cup dried fruit, or 1 cup 100% juice. Vegetables Aim to eat 2-3 cup-equivalents of fresh and frozen vegetables each day, including different varieties and colors. Examples of 1 cup-equivalent of vegetables include 2 medium carrots, 2 cups raw, leafy greens, 1 cup chopped vegetable (raw or cooked), or 1 medium baked potato. Grains Aim to eat 6 ounce-equivalents of whole grains each day. Examples of 1 ounce-equivalent of grains include 1 slice of bread, 1 cup ready-to-eat cereal, 3 cups popcorn, or  cup cooked rice, pasta, or cereal. Meats and other proteins Aim to eat 5-6 ounce-equivalents of protein each day. Examples of 1 ounce-equivalent of protein include 1 egg, 1/2 cup nuts or seeds, or 1 tablespoon (16 g) peanut butter. A cut of meat or fish that is the size of a deck of cards is about 3-4 ounce-equivalents.  Of the protein you eat each week, try to have at least 8 ounces come from seafood. This includes salmon, trout, herring, and anchovies. Dairy Aim to eat 3 cup-equivalents of fat-free or low-fat dairy each day. Examples of 1 cup-equivalent of dairy include 1 cup (240 mL) milk, 8 ounces (250 g) yogurt, 1 ounces (44 g) natural cheese, or 1 cup (240 mL) fortified soy milk. Fats and oils  Aim for about 5 teaspoons (21 g) per day. Choose monounsaturated fats, such as canola and olive oils, avocados, peanut butter, and most nuts, or polyunsaturated fats, such as sunflower, corn, and soybean oils, walnuts, pine nuts, sesame seeds, sunflower seeds, and flaxseed. Beverages  Aim for six 8-oz glasses of water per day. Limit coffee to three to five 8-oz cups per day.  Limit caffeinated beverages that have added calories, such as soda and energy drinks.  Limit alcohol intake to no more than 1 drink a day for nonpregnant women and 2 drinks a day for men. One drink equals 12 oz of beer (355 mL), 5 oz of wine (148 mL), or 1 oz of  hard liquor (44 mL). Seasoning and other foods  Avoid adding excess amounts of salt to your foods. Try flavoring foods with herbs and spices instead of salt.  Avoid adding sugar to foods.  Try using oil-based dressings, sauces, and spreads instead of solid fats. This information is based on general [place] nutrition guidelines. For more information, visit [employer]. Exact amounts may vary based on your nutrition needs. Summary  A healthy eating plan may help you to maintain a healthy weight, reduce the risk of chronic diseases, and stay active throughout your life.  Plan your meals. Make sure you eat the right portions of a variety of nutrient-rich foods.  Try baking, boiling, grilling, or broiling instead of frying.  Choose healthy options in all settings, including home, work, school, restaurants, or stores. This information is not intended to replace advice given to you by your health care provider. Make sure you discuss any questions you have with your health care provider. Document Released: 03/26/2018 Document Revised: 03/26/2018 Document Reviewed: 03/26/2018 Elsevier Patient Education  [place].   Mindfulness-Based Stress Reduction Mindfulness-based stress reduction (MBSR) is a program that helps people learn to practice mindfulness. Mindfulness is the practice of intentionally paying attention to the present  moment. It can be learned and practiced through techniques such as education, breathing exercises, meditation, and yoga. MBSR includes several mindfulness techniques in one program. MBSR works best when you understand the treatment, are willing to try new things, and can commit to spending time practicing what you learn. MBSR training may include learning about:  How your emotions, thoughts, and reactions affect your body.  New ways to respond to things that cause negative thoughts to start (triggers).  How to notice your thoughts and let go of them.   Practicing awareness of everyday things that you normally do without thinking.  The techniques and goals of different types of meditation. What are the benefits of MBSR? MBSR can have many benefits, which include helping you to:  Develop self-awareness. This refers to knowing and understanding yourself.  Learn skills and attitudes that help you to participate in your own health care.  Learn new ways to care for yourself.  Be more accepting about how things are, and let things go.  Be less judgmental and approach things with an open mind.  Be patient with yourself and trust yourself more. MBSR has also been shown to:  Reduce negative emotions, such as depression and anxiety.  Improve memory and focus.  Change how you sense and approach pain.  Boost your body's ability to fight infections.  Help you connect better with other people.  Improve your sense of well-being. Follow these instructions at home:   Find a local in-person or online MBSR program.  Set aside some time regularly for mindfulness practice.  Find a mindfulness practice that works best for you. This may include one or more of the following: ? Meditation. Meditation involves focusing your mind on a certain thought or activity. ? Breathing awareness exercises. These help you to stay present by focusing on your breath. ? Body scan. For this practice, you lie down and pay attention to each part of your body from head to toe. You can identify tension and soreness and intentionally relax parts of your body. ? Yoga. Yoga involves stretching and breathing, and it can improve your ability to move and be flexible. It can also provide an experience of testing your body's limits, which can help you release stress. ? Mindful eating. This way of eating involves focusing on the taste, texture, color, and smell of each bite of food. Because this slows down eating and helps you feel full sooner, it can be an important part of a  weight-loss plan.  Find a podcast or recording that provides guidance for breathing awareness, body scan, or meditation exercises. You can listen to these any time when you have a free moment to rest without distractions.  Follow your treatment plan as told by your health care provider. This may include taking regular medicines and making changes to your diet or lifestyle as recommended. How to practice mindfulness To do a basic awareness exercise:  Find a comfortable place to sit.  Pay attention to the present moment. Observe your thoughts, feelings, and surroundings just as they are.  Avoid placing judgment on yourself, your feelings, or your surroundings. Make note of any judgment that comes up, and let it go.  Your mind may wander, and that is okay. Make note of when your thoughts drift, and return your attention to the present moment. To do basic mindfulness meditation:  Find a comfortable place to sit. This may include a stable chair or a firm floor cushion. ? Sit upright with your back  straight. Let your arms fall next to your side with your hands resting on your legs. ? If sitting in a chair, rest your feet flat on the floor. ? If sitting on a cushion, cross your legs in front of you.  Keep your head in a neutral position with your chin dropped slightly. Relax your jaw and rest the tip of your tongue on the roof of your mouth. Drop your gaze to the floor. You can close your eyes if you like.  Breathe normally and pay attention to your breath. Feel the air moving in and out of your nose. Feel your belly expanding and relaxing with each breath.  Your mind may wander, and that is okay. Make note of when your thoughts drift, and return your attention to your breath.  Avoid placing judgment on yourself, your feelings, or your surroundings. Make note of any judgment or feelings that come up, let them go, and bring your attention back to your breath.  When you are ready, lift your gaze  or open your eyes. Pay attention to how your body feels after the meditation. Where to find more information You can find more information about MBSR from:  Your health care provider.  Community-based meditation centers or programs.  Programs offered near you. Summary  Mindfulness-based stress reduction (MBSR) is a program that teaches you how to intentionally pay attention to the present moment. It is used with other treatments to help you cope better with daily stress, emotions, and pain.  MBSR focuses on developing self-awareness, which allows you to respond to life stress without judgment or negative emotions.  MBSR programs may involve learning different mindfulness practices, such as breathing exercises, meditation, yoga, body scan, or mindful eating. Find a mindfulness practice that works best for you, and set aside time for it on a regular basis. This information is not intended to replace advice given to you by your health care provider. Make sure you discuss any questions you have with your health care provider. Document Released: 04/20/2017 Document Revised: 11/24/2017 Document Reviewed: 04/20/2017 Elsevier Patient Education  [place].     Cluster Headache Cluster headaches are deeply painful. They normally occur on one side of your head, but they may switch sides. Often, cluster headaches:  Are severe.  Happen often for a few weeks or months and then go away for a while.  Last from 15 minutes to 3 hours.  Happen at the same time each day.  Happen at night.  Happen many times a day. Follow these instructions at home:        Follow instructions from your doctor to care for yourself at home:  Go to bed at the same time each night. Get the same amount of sleep every night.  Avoid alcohol.  Stop smoking if you smoke. This includes cigarettes and e-cigarettes.  Take over-the-counter and prescription medicines only as told by your doctor.  Do not  drive or use heavy machinery while taking prescription pain medicine.  Use oxygen as told by your doctor.  Exercise regularly.  Eat a healthy diet.  Write down when each headache happened, what kind of pain you had, how bad your pain was, and what you tried to help your pain. This is called a headache diary. Use it as told by your doctor. Contact a doctor if:  Your headaches get worse or they happen more often.  Your medicines are not helping. Get help right away if:  You pass out (faint).  You get weak or lose feeling (have numbness) on one side of your body or face.  You see two of everything (double vision).  You feel sick to your stomach (nauseous) or you throw up (vomit), and you do not stop after many hours.  You have trouble with your balance or with walking.  You have trouble talking.  You have neck pain or stiffness.  You have a fever. This information is not intended to replace advice given to you by your health care provider. Make sure you discuss any questions you have with your health care provider. Document Released: 01/19/2005 Document Revised: 03/09/2018 Document Reviewed: 08/19/2016 Elsevier Patient Education  2020 [employer].

## 2019-08-22 NOTE — Progress Notes (Signed)
Made any corrections needed, and agree with history, physical, neuro exam,assessment and plan as stated.     Demitria Hay, MD [facility]  

## 2019-08-23 ENCOUNTER — Ambulatory Visit (INDEPENDENT_AMBULATORY_CARE_PROVIDER_SITE_OTHER): Payer: BC Managed Care – PPO

## 2019-08-23 ENCOUNTER — Ambulatory Visit: Payer: BC Managed Care – PPO | Admitting: Pulmonary Disease

## 2019-08-23 ENCOUNTER — Encounter: Payer: Self-pay | Admitting: Pulmonary Disease

## 2019-08-23 ENCOUNTER — Other Ambulatory Visit: Payer: Self-pay

## 2019-08-23 VITALS — BP 118/70 | HR 72 | Temp 98.2°F | Ht 65.0 in | Wt 181.4 lb

## 2019-08-23 DIAGNOSIS — R918 Other nonspecific abnormal finding of lung field: Secondary | ICD-10-CM | POA: Diagnosis not present

## 2019-08-23 DIAGNOSIS — G4733 Obstructive sleep apnea (adult) (pediatric): Secondary | ICD-10-CM | POA: Diagnosis not present

## 2019-08-23 IMAGING — DX CHEST - 2 VIEW
2 series · 2 of 2 positions shown · non-contrast
Comparison: Chest CT [DATE] and chest radiograph [DATE]

CLINICAL DATA: Report of previous pulmonary nodules

EXAM:
CHEST - 2 VIEW

[chest pa]
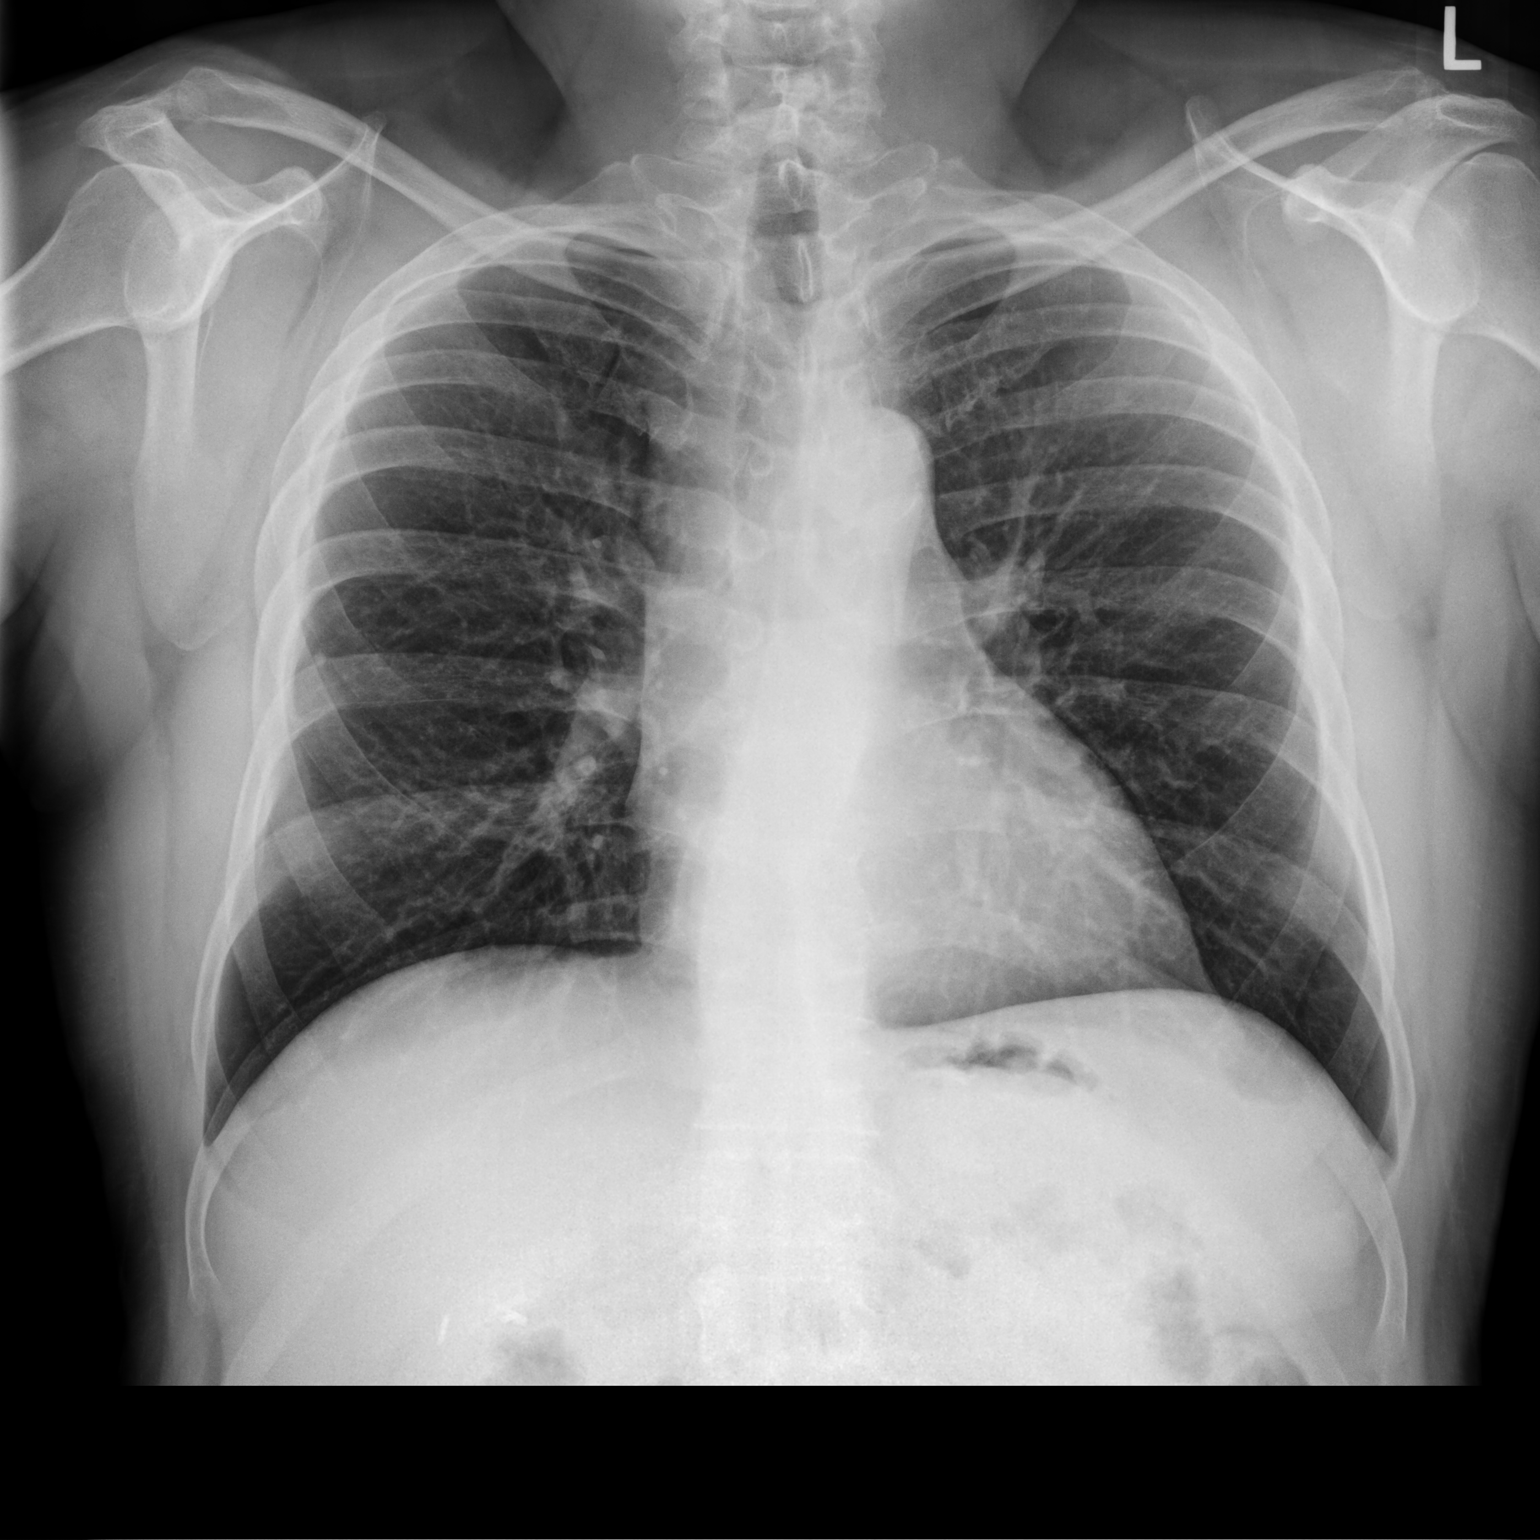

[chest lat]
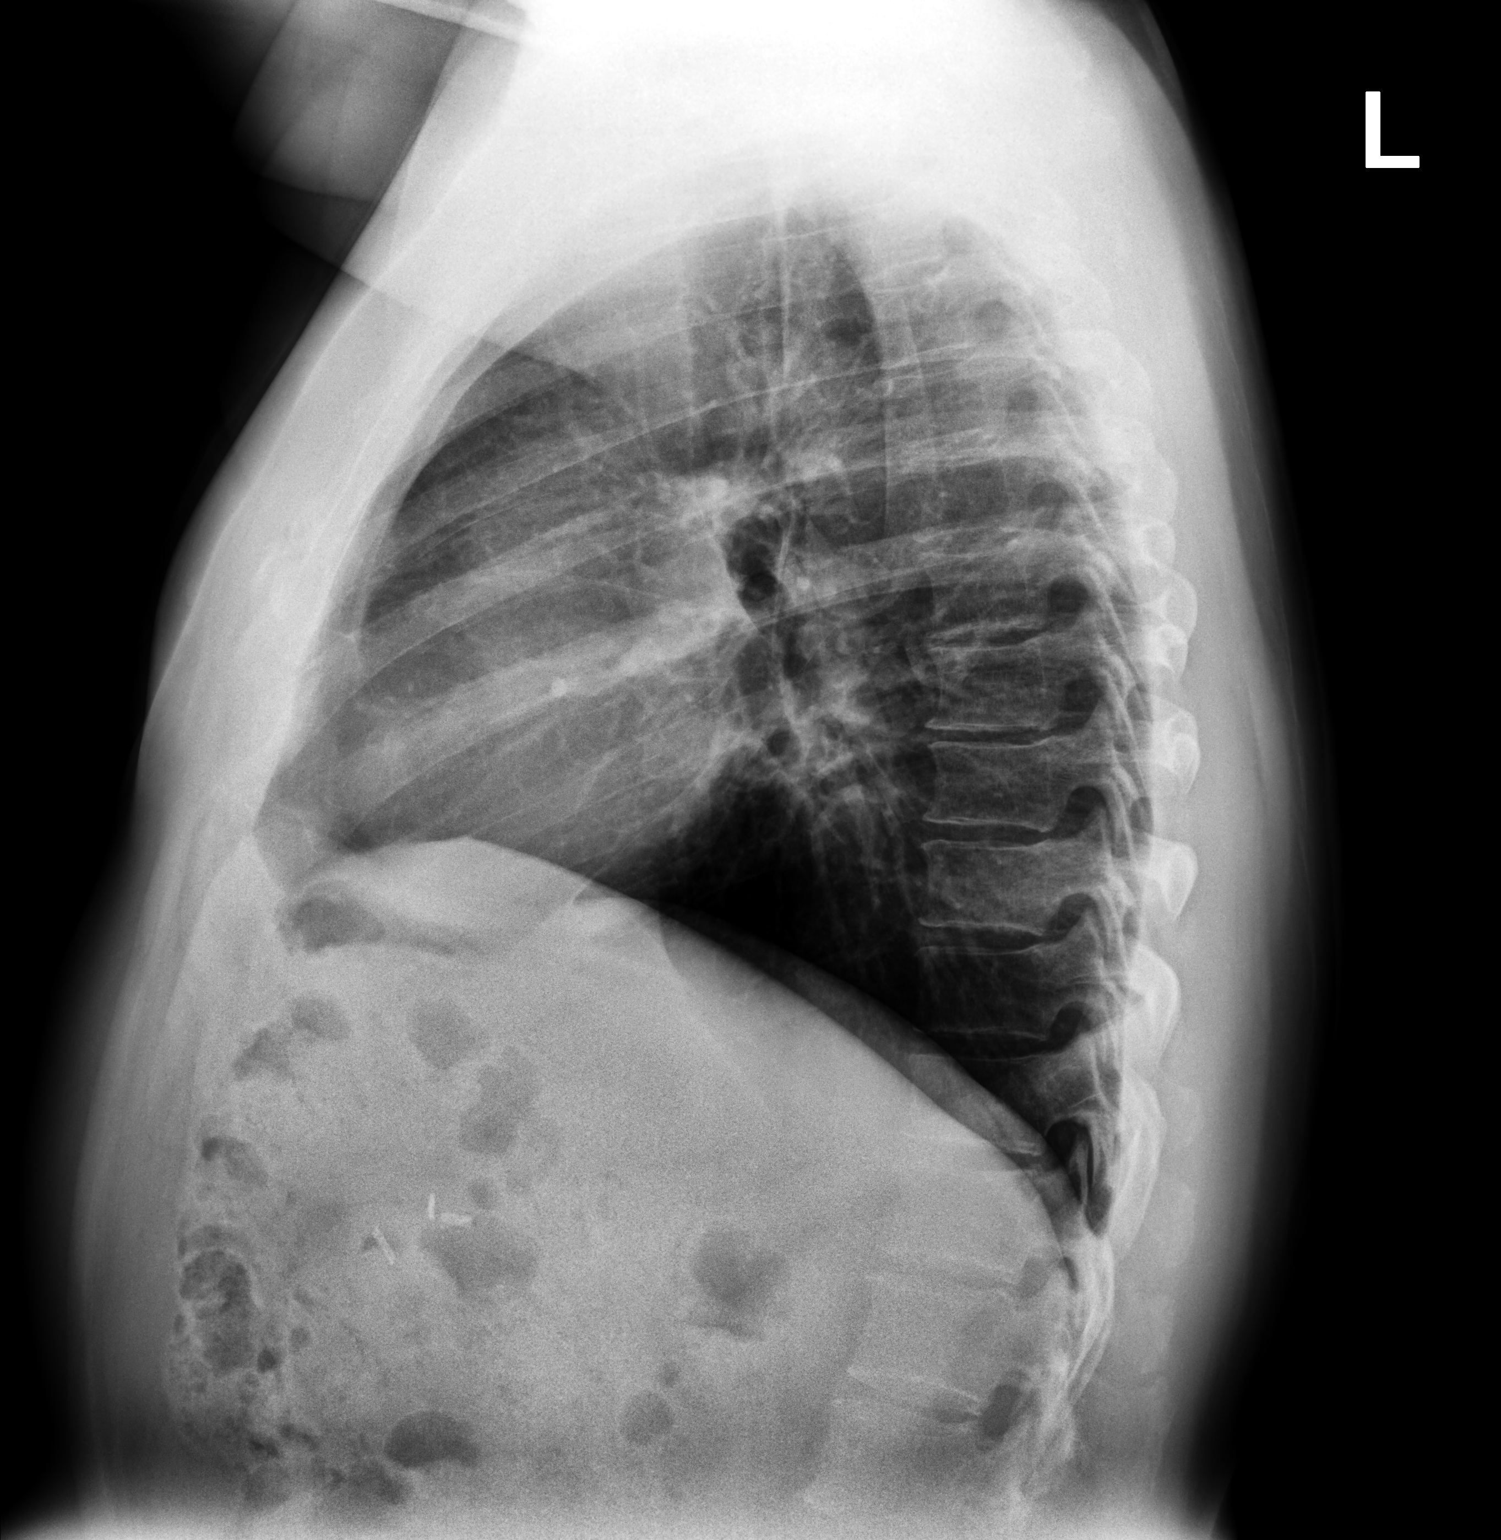

[2 of 2 positions shown; findings below may reference images not displayed]

FINDINGS: Lungs are clear. No pulmonary nodular lesions are evident by
radiography. Heart size and pulmonary vascularity are normal. No
adenopathy. No bone lesions.
IMPRESSION: No edema or consolidation. No pulmonary nodular lesions evident by
radiography. The small nodular lesions seen on prior CT of the chest
from [R9] are not appreciable by radiography. Cardiac silhouette
within normal limits.

## 2019-08-23 NOTE — Patient Instructions (Signed)
Chest x-ray today -to follow-up on nodules.  Schedule home sleep study

## 2019-08-23 NOTE — Progress Notes (Signed)
Subjective:    Patient ID: Rickey Cruz, male    DOB: [birth year], 57 y.o.   MRN: [id number]  HPI  58 year old Hispanic man, originally from [place], presents to reestablish care for pulmonary nodules and OSA. He was last seen in our office 05/2012 by my partner Dr. Annamaria Boots  He has history of small calcified and noncalcified lung nodules on CT scan 02/02/2010. 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  Follow-up scans were stable in 2013 and last chest x-ray from 08/2014 appears clear except for calcified right upper lobe nodule   He carries a diagnosis of OSA for many years, was on CPAP of 14 cm at one time but in 2012 was decreased to CPAP of 9 cm after his evaluation with [place].  He moved to [place] and underwent nasal septal surgery and UPPP in 2014.  He had a follow-up sleep study and was told that OSA has resolved and he was able to stop using CPAP.  He had also lost some weight he has gradually regained some of this weight to his current weight of 176 pounds at home, he weighed 195 pounds with his clothes on. He was advised tongue reduction surgery in [place] and he wonders if he still needs this  Epworth sleepiness score is 16 he reports sleepiness while sitting and reading, or in a car was stopped in traffic He reports occasional choking episodes that wake him up from sleep.  Bedtime is anywhere from 10 to 11 PM, TV stays on through the night, he has awakenings around 3 AM and 5 AM, denies nocturia and is out of bed latest by 8 AM feeling tired with occasional headaches and dryness of mouth. 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 Allergy    Flonase, Patanol, Zyrtec  . Arthritis    psoriasis  . Asthma    seasonal, with allergies  . Borderline diabetes   . Cluster headache   . Colon polyp 10/27/2011  . Depression    denies 11/11/16; denies 11/21/18 "been years"  . GERD (gastroesophageal reflux disease)   . History of kidney stones    x1  . Hyperlipidemia   . Hypertension   . Low back pain    "in kidney area-was told that it was related to gallstones"  . Prediabetes   . Psoriasis    [name]  . Severe sleep apnea    h/o; had procedure and weight control, does not have to wear CPAP  . 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 APPENDECTOMY    . CHOLECYSTECTOMY N/A 12/16/2014   Procedure: LAPAROSCOPIC CHOLECYSTECTOMY WITH INTRAOPERATIVE CHOLANGIOGRAM;  Surgeon: [name], MD;  Location: [facility];  Service: General;  Laterality: N/A;  . COLONOSCOPY  10/27/2011   single polyp. Repeat 5 years.  Marland Kitchen POLYPECTOMY    . PROSTATE BIOPSY  10/2014   will have another in 3 months  . SEPTOPLASTY  2014  . TONSILLECTOMY  2014  . UVULOPALATOPHARYNGOPLASTY  2014    No Known Allergies  Social History  Socioeconomic History  . Marital status: Divorced    Spouse name: Not on file  . Number of children: 1  . Years of education: Not on file  . Highest education level: Doctorate  Occupational History  . Occupation: Product manager: [employer]    Comment: Pharmacist, community  Social Needs  . Financial resource strain: Not on file  . Food insecurity    Worry: Not on file    Inability: Not on file  . Transportation needs    Medical: Not on file    Non-medical: Not on file  Tobacco Use  . Smoking status: Never Smoker  . Smokeless tobacco: Never Used  Substance and Sexual Activity  . Alcohol use: Never    Alcohol/week: 0.0 standard drinks    Frequency: Never  . Drug use: Never  . Sexual  activity: Yes    Comment: SSP  Lifestyle  . Physical activity    Days per week: Not on file    Minutes per session: Not on file  . Stress: Not on file  Relationships  . Social Herbalist on phone: Not on file    Gets together: Not on file    Attends religious service: Not on file    Active member of club or organization: Not on file    Attends meetings of clubs or organizations: Not on file    Relationship status: Not on file  . Intimate partner violence    Fear of current or ex partner: Not on file    Emotionally abused: Not on file    Physically abused: Not on file    Forced sexual activity: Not on file  Other Topics Concern  . Not on file  Social History Narrative   Original from [place]; moved to [place] 1995.   Marital status: same sexual partner 4 X years.   Children:  1 child/son (18) at 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        Tobacco: never      Alcohol: none      Drugs: none       Exercise: daily in 2018; 10,000 steps daily; lots of walking on campus at work      ADLs: independent with ADLs.        Sexual activity: dating males only in 2018; bisexual.  Previously married to male.       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           Family History  Problem Relation Age of Onset  . Stroke Mother   . Asthma Sister   . Colon cancer Neg Hx   . Esophageal cancer Neg Hx   . Stomach cancer Neg Hx   . Prostate cancer Neg Hx   . Diabetes Neg Hx   . CAD Neg Hx   . Migraines Neg Hx   . Headache Neg Hx      Review of Systems  Constitutional: Negative  for fever and unexpected weight change.  HENT: Positive for sore throat. Negative for congestion, dental problem, ear pain, nosebleeds, postnasal drip, rhinorrhea,  sinus pressure, sneezing and trouble swallowing.   Eyes: Positive for redness and itching.  Respiratory: Negative for cough, chest tightness, shortness of breath and wheezing.   Cardiovascular: Negative for palpitations and leg swelling.  Gastrointestinal: Positive for nausea. Negative for vomiting.  Genitourinary: Negative for dysuria.  Musculoskeletal: Negative for joint swelling.  Skin: Negative for rash.  Allergic/Immunologic: Negative.  Negative for environmental allergies, food allergies and immunocompromised state.  Neurological: Positive for headaches.  Hematological: Does not bruise/bleed easily.  Psychiatric/Behavioral: Negative for dysphoric mood. The patient is not nervous/anxious.        Objective:   Physical Exam  Gen. Pleasant, well-nourished, in no distress, normal affect ENT - no pallor,icterus, no post nasal drip , s/p UPPP Neck: No JVD, no thyromegaly, no carotid bruits Lungs: no use of accessory muscles, no dullness to percussion, clear without rales or rhonchi  Cardiovascular: Rhythm regular, heart sounds  normal, no murmurs or gallops, no peripheral edema Abdomen: soft and non-tender, no hepatosplenomegaly, BS normal. Musculoskeletal: No deformities, no cyanosis or clubbing Neuro:  alert, non focal       Assessment & Plan:

## 2019-08-23 NOTE — Assessment & Plan Note (Signed)
Appears to be benign does not need CT follow-up. Will obtain chest x-ray today

## 2019-08-23 NOTE — Assessment & Plan Note (Signed)
Given excessive daytime somnolence, narrow pharyngeal exam, and choking episodes, residual obstructive sleep apnea is possible & an overnight polysomnogram will be scheduled as a home study. The pathophysiology of obstructive sleep apnea , it's cardiovascular consequences & modes of treatment including CPAP were discused with the patient in detail & they evidenced understanding.  If he has AHI more than 15 then may need CPAP therapy or can consider oral appliance

## 2019-08-23 NOTE — Addendum Note (Signed)
Addended by: Valerie Salts on: 08/23/2019 10:01 AM   Modules accepted: Orders

## 2019-08-28 ENCOUNTER — Other Ambulatory Visit (HOSPITAL_COMMUNITY)
Admission: RE | Admit: 2019-08-28 | Discharge: 2019-08-28 | Disposition: A | Discharge status: Home / Self Care | Payer: BC Managed Care – PPO | Source: Ambulatory Visit | Attending: Pulmonary Disease | Admitting: Pulmonary Disease

## 2019-08-28 DIAGNOSIS — Z20828 Contact with and (suspected) exposure to other viral communicable diseases: Secondary | ICD-10-CM | POA: Insufficient documentation

## 2019-08-28 DIAGNOSIS — Z01812 Encounter for preprocedural laboratory examination: Secondary | ICD-10-CM | POA: Insufficient documentation

## 2019-08-28 LAB — SARS CORONAVIRUS 2 (TAT 6-24 HRS): SARS Coronavirus 2: NEGATIVE

## 2019-09-01 ENCOUNTER — Ambulatory Visit (HOSPITAL_BASED_OUTPATIENT_CLINIC_OR_DEPARTMENT_OTHER): Payer: BC Managed Care – PPO | Attending: Pulmonary Disease | Admitting: Pulmonary Disease

## 2019-09-01 ENCOUNTER — Other Ambulatory Visit: Payer: Self-pay

## 2019-09-01 DIAGNOSIS — G4733 Obstructive sleep apnea (adult) (pediatric): Secondary | ICD-10-CM

## 2019-09-03 ENCOUNTER — Other Ambulatory Visit: Payer: Self-pay

## 2019-09-09 ENCOUNTER — Other Ambulatory Visit: Payer: Self-pay

## 2019-09-09 ENCOUNTER — Ambulatory Visit (INDEPENDENT_AMBULATORY_CARE_PROVIDER_SITE_OTHER)
Admission: RE | Admit: 2019-09-09 | Discharge: 2019-09-09 | Disposition: A | Discharge status: Home / Self Care | Payer: BC Managed Care – PPO | Source: Ambulatory Visit | Attending: Nurse Practitioner | Admitting: Nurse Practitioner

## 2019-09-09 DIAGNOSIS — R109 Unspecified abdominal pain: Secondary | ICD-10-CM

## 2019-09-09 DIAGNOSIS — R194 Change in bowel habit: Secondary | ICD-10-CM

## 2019-09-09 DIAGNOSIS — R112 Nausea with vomiting, unspecified: Secondary | ICD-10-CM

## 2019-09-09 IMAGING — CT CT ABD-PELV W/ CM
2 of 5 series · 16 of 46 positions shown, 18 images · IV contrast (omnipaque)
Comparison: Abdominal ultrasound [DATE]. Prostate MRI
[DATE].

CLINICAL DATA: Left lower quadrant pain radiating to the back for 2
years, worse in the past 6 weeks. Nausea, decreased appetite, and
diarrhea. History of appendectomy and cholecystectomy.

EXAM:
CT ABDOMEN AND PELVIS WITH CONTRAST
TECHNIQUE: Multidetector CT imaging of the abdomen and pelvis was performed
using the standard protocol following bolus administration of
intravenous contrast.
CONTRAST:  100mL OMNIPAQUE IOHEXOL 300 MG/ML  SOLN

[Series 2: abd/pel w · axial · 0.74mm/px · z∈[-454,-9]mm · 13 of 99 slices shown, 15 images]
[im 5/99  soft-tissue]
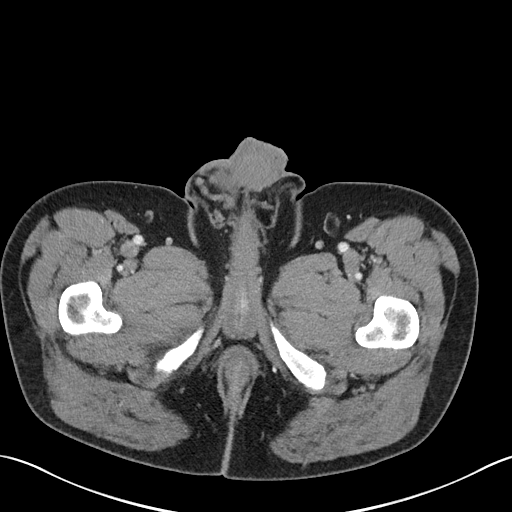
[im 5/99  bone]
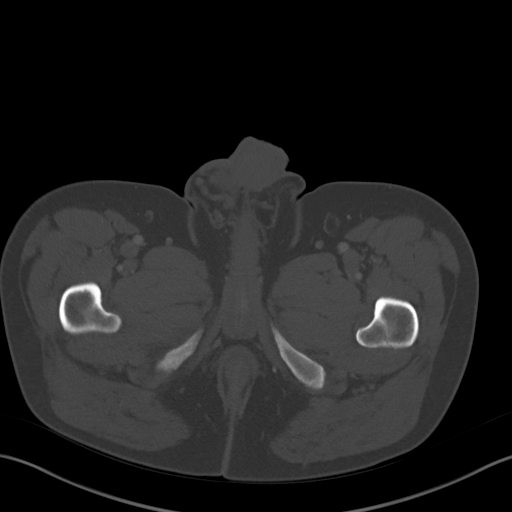
[im 15/99  soft-tissue]
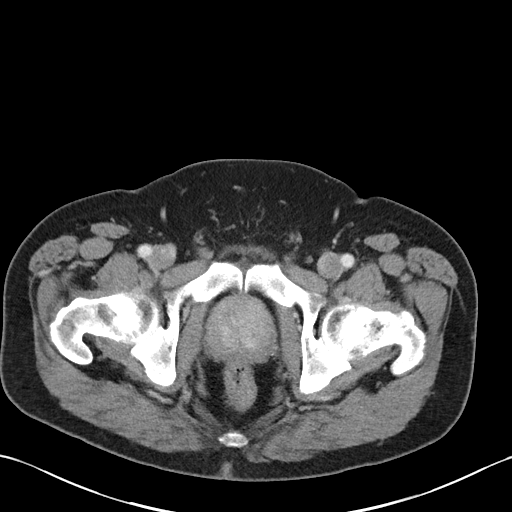
[im 20/99  soft-tissue]
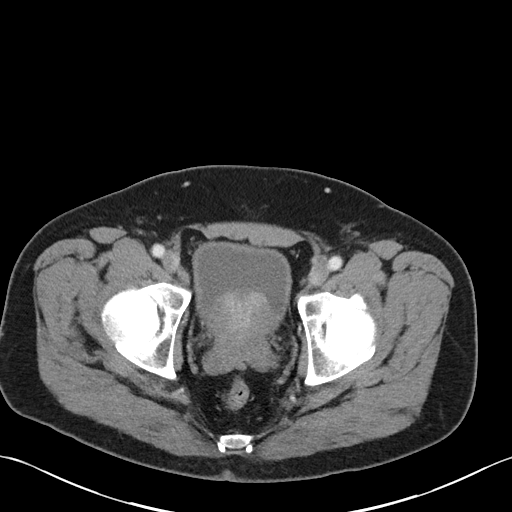
[im 30/99  soft-tissue]
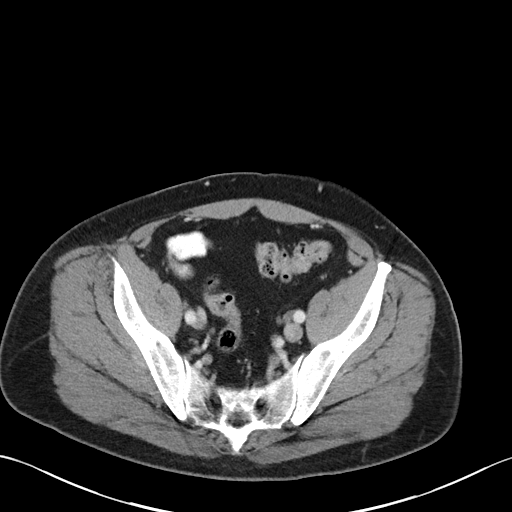
[im 35/99  soft-tissue]
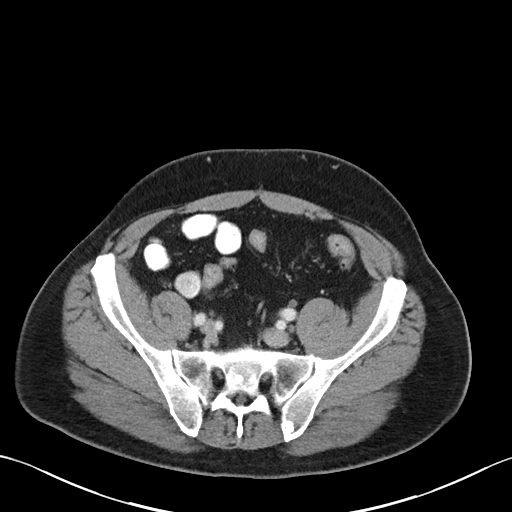
[im 45/99  soft-tissue]
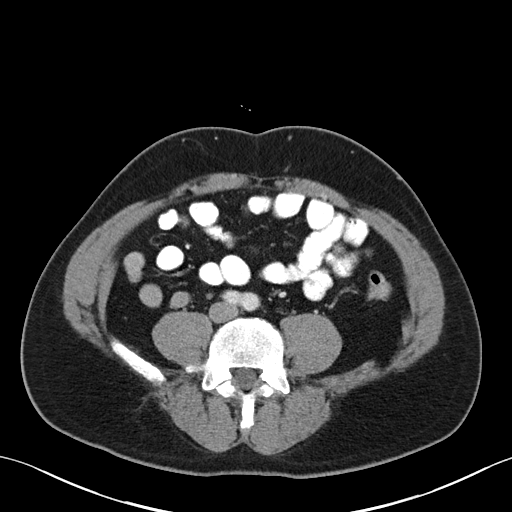
[im 50/99  soft-tissue]
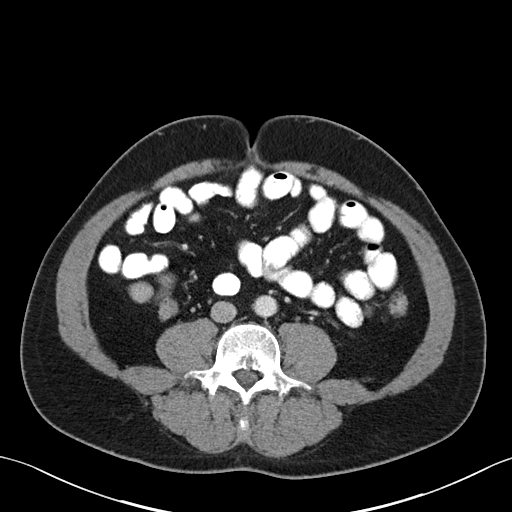
[im 54/99  soft-tissue]
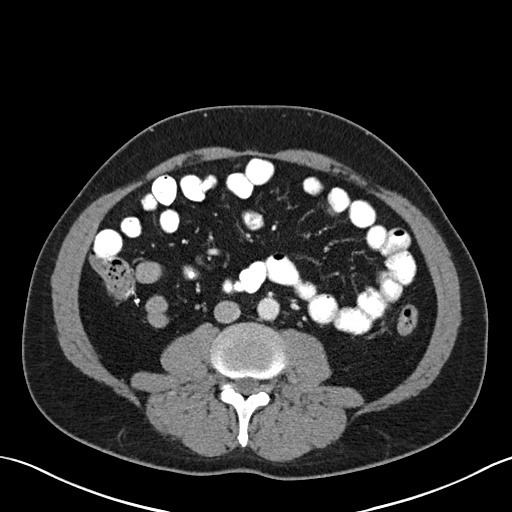
[im 64/99  soft-tissue]
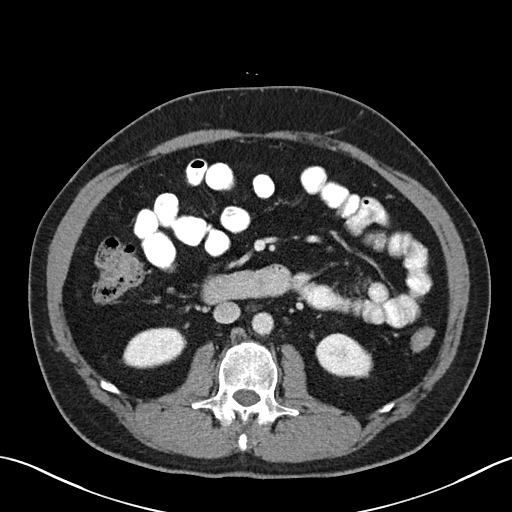
[im 64/99  bone]
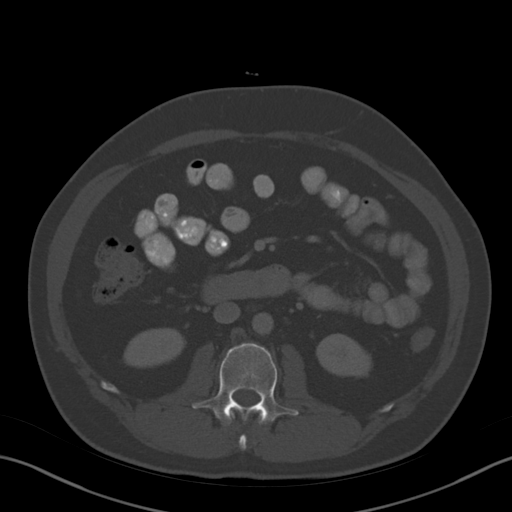
[im 69/99  soft-tissue]
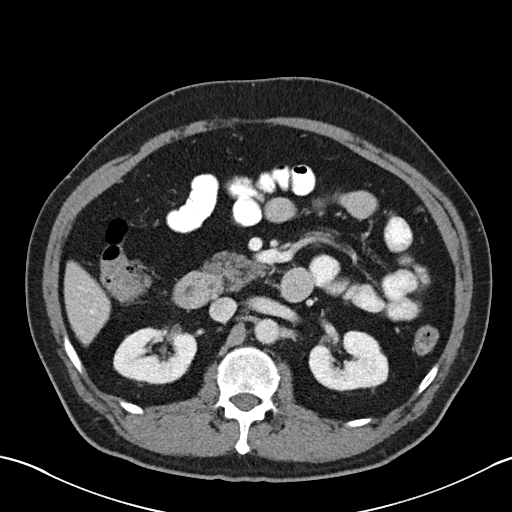
[im 79/99  soft-tissue]
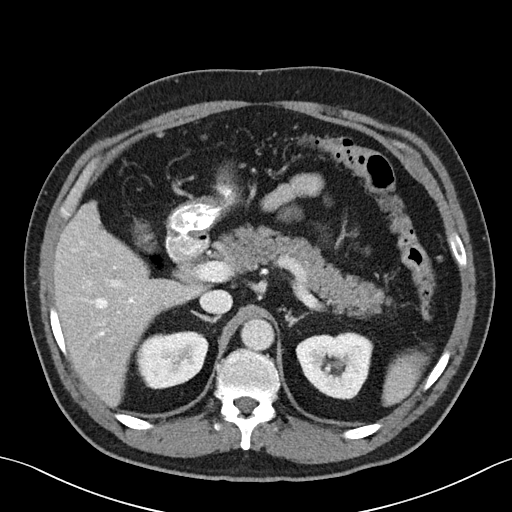
[im 84/99  soft-tissue]
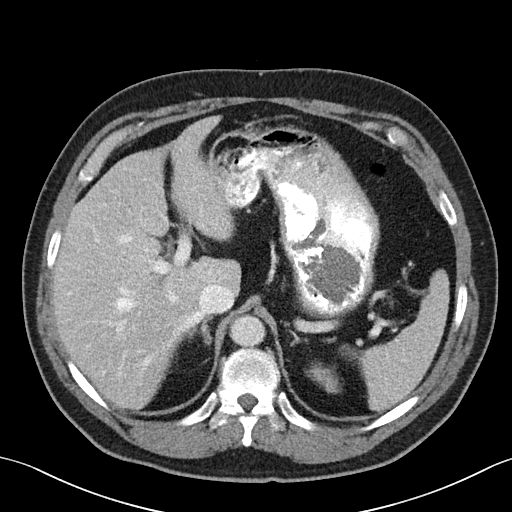
[im 94/99  soft-tissue]
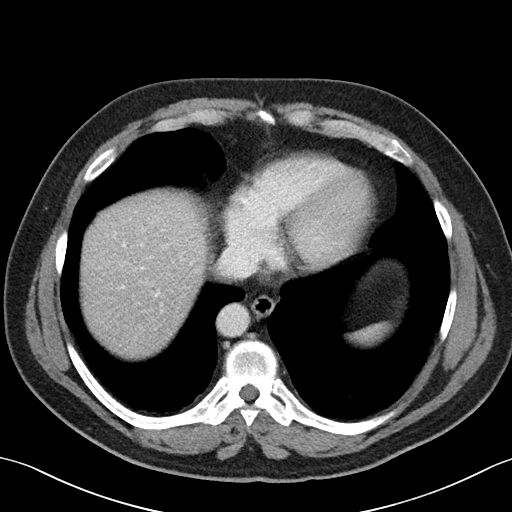

[Series 6: abd/pel w st · coronal · 0.72mm/px · 3 of 91 slices shown]
[im 31/91  soft-tissue]
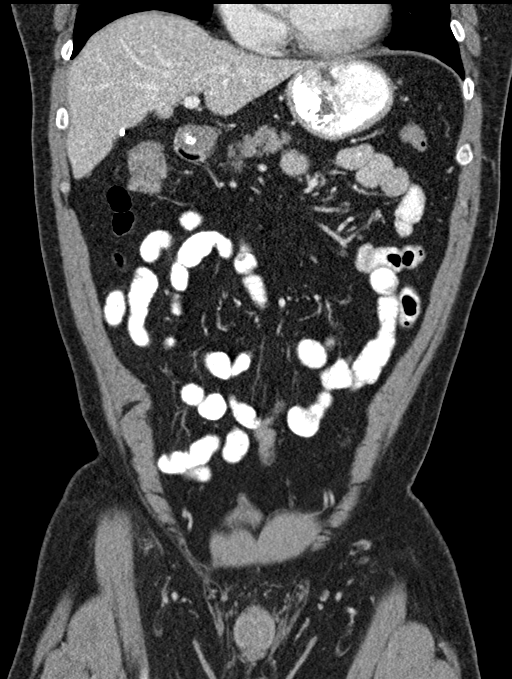
[im 41/91  soft-tissue]
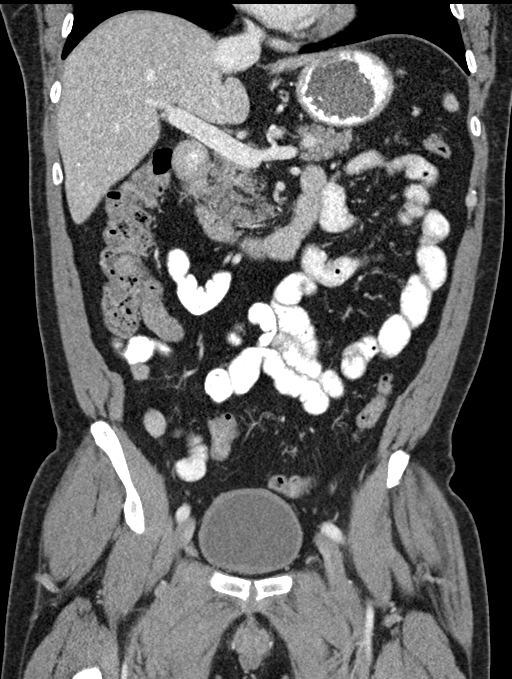
[im 51/91  soft-tissue]
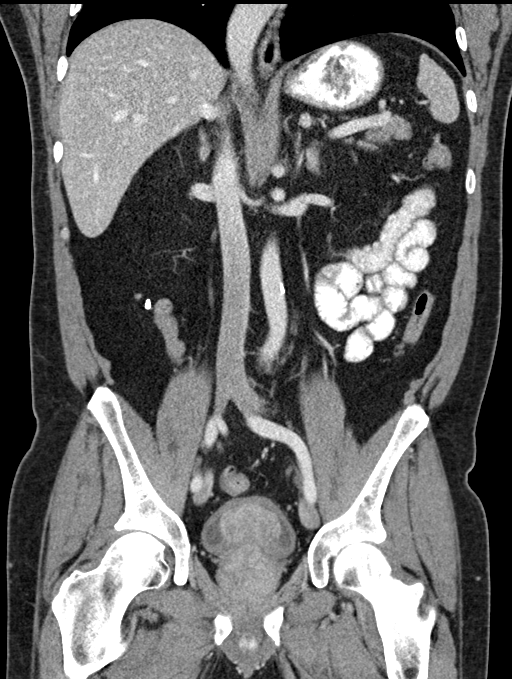

[16 of 46 positions shown; findings below may reference images not displayed]

FINDINGS: Lower chest: Clear lung bases.

Hepatobiliary: No focal liver abnormality is seen. Status post
cholecystectomy. No biliary dilatation.

Pancreas: Unremarkable.

Spleen: Unremarkable.

Adrenals/Urinary Tract: Unremarkable adrenal glands. 2 mm stone in
the lower pole of the left kidney. No hydronephrosis. Subcentimeter
low-density lesion in the upper pole of the left kidney, too small
to fully characterize. Mild chronic bladder wall thickening likely
related to chronic outlet obstruction from prostatic enlargement.

Stomach/Bowel: The stomach is unremarkable. There is no evidence of
bowel obstruction. Mild left-sided colonic diverticulosis is noted
without evidence of diverticulitis. Status post appendectomy.

Vascular/Lymphatic: Mild abdominal aortic atherosclerosis without
aneurysm. No enlarged lymph nodes.

Reproductive: Enlarged prostate extending into the inferior aspect
of the bladder, similar to the prior MRI.

Other: No intraperitoneal free fluid. No abdominal wall hernia.

Musculoskeletal: No acute osseous abnormality or suspicious osseous
lesion.
IMPRESSION: 1. No acute abnormality identified in the abdomen or pelvis.
2. Nonobstructing left nephrolithiasis.
3. Prostatomegaly.
4. Aortic Atherosclerosis ([FU]-[FU]).

## 2019-09-09 MED ORDER — IOHEXOL 300 MG/ML  SOLN
100.0000 mL | Freq: Once | INTRAMUSCULAR | Status: AC | PRN
  Administered 2019-09-09: 100 mL via INTRAVENOUS

## 2019-09-10 ENCOUNTER — Telehealth: Payer: Self-pay | Admitting: Pulmonary Disease

## 2019-09-10 ENCOUNTER — Ambulatory Visit: Payer: BC Managed Care – PPO | Admitting: Nurse Practitioner

## 2019-09-10 DIAGNOSIS — G4733 Obstructive sleep apnea (adult) (pediatric): Secondary | ICD-10-CM | POA: Diagnosis not present

## 2019-09-10 NOTE — Telephone Encounter (Signed)
Sleep study did not show significant OSA Does not need any surgery

## 2019-09-10 NOTE — Procedures (Signed)
Patient Name: Rickey Cruz, Rickey Cruz Date: 09/01/2019 Gender: Male D.O.B: 07-22-1961 Age (years): 62 Referring Provider: Kara Mead MD, ABSM Height (inches): 65 Interpreting Physician: Kara Mead MD, ABSM Weight (lbs): 176 RPSGT: Baxter Flattery BMI: 29 MRN: [id number] Neck Size: 17.00 <br> <br> CLINICAL INFORMATION Sleep Study Type: NPSG    Indication for sleep study: Snoring, Witnesses Apnea / Gasping During Sleep , s/p UPPP   Epworth Sleepiness Score: 15    SLEEP STUDY TECHNIQUE As per the AASM Manual for the Scoring of Sleep and Associated Events v2.3 (April 2016) with a hypopnea requiring 4% desaturations.  The channels recorded and monitored were frontal, central and occipital EEG, electrooculogram (EOG), submentalis EMG (chin), nasal and oral airflow, thoracic and abdominal wall motion, anterior tibialis EMG, snore microphone, electrocardiogram, and pulse oximetry.  MEDICATIONS Medications self-administered by patient taken the night of the study : N/A  SLEEP ARCHITECTURE The study was initiated at 9:59:18 PM and ended at 5:04:16 AM.  Sleep onset time was 7.3 minutes and the sleep efficiency was 85.6%%. The total sleep time was 363.7 minutes.  Stage REM latency was 74.5 minutes.  The patient spent 2.7%% of the night in stage N1 sleep, 73.9%% in stage N2 sleep, 0.0%% in stage N3 and 23.4% in REM.  Alpha intrusion was absent.  Supine sleep was 33.05%.  RESPIRATORY PARAMETERS The overall apnea/hypopnea index (AHI) was 0.3 per hour. There were 2 total apneas, including 2 obstructive, 0 central and 0 mixed apneas. There were 0 hypopneas and 3 RERAs.  The AHI during Stage REM sleep was 0.7 per hour.  AHI while supine was 0.0 per hour.  The mean oxygen saturation was 93.9%. The minimum SpO2 during sleep was 90.0%.  soft snoring was noted during this study.  CARDIAC DATA The 2 lead EKG demonstrated sinus rhythm. The mean heart rate was 57.8 beats per minute. Other  EKG findings include: None.   LEG MOVEMENT DATA The total PLMS were 0 with a resulting PLMS index of 0.0. Associated arousal with leg movement index was 0.0 .  IMPRESSIONS - No significant obstructive sleep apnea occurred during this study (AHI = 0.3/h). - No significant central sleep apnea occurred during this study (CAI = 0.0/h). - The patient had minimal or no oxygen desaturation during the study (Min O2 = 90.0%) - The patient snored with soft snoring volume. - No cardiac abnormalities were noted during this study. - Clinically significant periodic limb movements did not occur during sleep. No significant associated arousals.   DIAGNOSIS - Normal sleep study   RECOMMENDATIONS - Avoid alcohol, sedatives and other CNS depressants that may worsen sleep apnea and disrupt normal sleep architecture. - Sleep hygiene should be reviewed to assess factors that may improve sleep quality. - Weight management and regular exercise should be initiated or continued if appropriate.   Kara Mead MD Board Certified in [place]

## 2019-09-10 NOTE — Telephone Encounter (Signed)
1 year F/U

## 2019-09-10 NOTE — Telephone Encounter (Signed)
Spoke with pt, aware of recs.  Recall placed for pt.  Nothing further needed at this time- will close encounter.

## 2019-09-10 NOTE — Telephone Encounter (Signed)
lmtcb X1 for pt to relay results/recs.  

## 2019-09-10 NOTE — Telephone Encounter (Signed)
Spoke with pt, aware of results/recs.  Pt wants to know when he needs to follow up with RA.  At last OV, no follow-up was documented.  Pt wants to make sure that he maintains regular follow-ups to monitor his pulmonary nodules.    RA please advise when pt needs to be seen again.  Thanks!

## 2019-09-11 ENCOUNTER — Other Ambulatory Visit: Payer: Self-pay

## 2019-09-11 ENCOUNTER — Encounter: Payer: Self-pay | Admitting: Nurse Practitioner

## 2019-09-11 ENCOUNTER — Ambulatory Visit: Payer: BC Managed Care – PPO | Admitting: Nurse Practitioner

## 2019-09-11 VITALS — BP 120/80 | HR 62 | Temp 98.5°F | Ht 65.0 in | Wt 181.0 lb

## 2019-09-11 DIAGNOSIS — M545 Low back pain, unspecified: Secondary | ICD-10-CM

## 2019-09-11 DIAGNOSIS — R110 Nausea: Secondary | ICD-10-CM

## 2019-09-11 NOTE — Progress Notes (Signed)
Chief Complaint:   Left side pain / nausea   IMPRESSION and PLAN:     58 yo male with persistent left low back / left waist pain.   Labs late June and CT AP 09/09/19 were unremarkable from GI standpoint. Small left renal stones non-obstructing so seems unlikely to be cause of ongoing discomfort. He has some mild nausea, possible response to the pain.  -Recommend patient talk with PCP. Could pain be coming from his back?  -He will try the SL levsin to see if it helps. I don't want him to use on a regular basis given enlarged prostate.  -Trial of Salonpas lidocaine patches to left lower back.  -He declined anti-emetic   HPI:     Patient is a 56 male who I saw late August for evaluation of left-sided abdominal pain, intermittent nausea and vomiting. Patient was anxious about symptoms. CT AP was done and unremarkable. Prescribed SL levsin but didn't try it as pain hasn't been that bad since I last saw him.  CTAP showed nonobstructing left renal stones, chronic mild thickening of the bladder and an enlarged prostate since to last imaging. [place]ver comes in with ongoing pain. The pain involves left waist around to left back. It it present more often than not and it is not related to position or movements such as bending / twisting.  He gets nausea with the pain.  BMs are fine.    Data Reviewed:   EGD with biopsies Nov 2017 Gastritis Path - chronic gastritis / reactive gastropathy  Colonoscopy Nov 2017 - hx of colon polyps Normal.  Repeat 10 years  Review of systems:     No chest pain, no SOB, no fevers, no urinary sx   Past Medical History:  Diagnosis Date  . Allergy    Flonase, Patanol, Zyrtec  . Arthritis    psoriasis  . Asthma    seasonal, with allergies  . Borderline diabetes   . Cluster headache   . Colon polyp 10/27/2011  . Depression    denies 11/11/16; denies 11/21/18 "been years"  . GERD (gastroesophageal reflux disease)   . History of kidney stones    x1  .  Hyperlipidemia   . Hypertension   . Low back pain    "in kidney area-was told that it was related to gallstones"  . Prediabetes   . Psoriasis    [name]  . Severe sleep apnea    h/o; had procedure and weight control, does not have to wear CPAP  . 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 albuterol (PROAIR HFA) 108 (90 Base) MCG/ACT inhaler Inhale 2 puffs into the lungs every 4 (four) hours as needed for wheezing or shortness of breath. 1 Inhaler 1  . amLODipine (NORVASC) 5 MG tablet TAKE 1 TABLET(5 MG) BY MOUTH DAILY 90 tablet 1  . aspirin EC 325 MG tablet Take 325 mg by mouth as needed.    . beclomethasone (QVAR) 80 MCG/ACT inhaler Inhale 2 puffs into the lungs 2 (two) times daily. 3 Inhaler 3  . clobetasol ointment (TEMOVATE) [id number] % Apply 1 application topically 2 (two) times daily.    . Continuous Blood Gluc Sensor ([place]) MISC 1 each by Does not apply route every  14 (fourteen) days. 1 each 10  . fluocinolone (VANOS) 0.01 % cream APPLY EXTERNALLY TO THE AFFECTED AREA TWICE DAILY 30 g 1  . fluocinonide cream (LIDEX) 0.05 % APPLY SPARINGLY TO THE AFFECTED AREA TWICE DAILY 30 g 1  . fluticasone (FLONASE) 50 MCG/ACT nasal spray SHAKE LIQUID AND USE 2 SPRAYS IN EACH NOSTRIL EVERY DAY 48 g 3  . fluticasone (FLOVENT HFA) 110 MCG/ACT inhaler Inhale 2 puffs into the lungs 2 (two) times daily. 3 Inhaler 4  . hydrochlorothiazide (HYDRODIURIL) 12.5 MG tablet Gaspar Shippee 90 tablet 0  . hyoscyamine (LEVSIN SL) 0.125 MG SL tablet Place 1 tablet (0.125 mg total) under the tongue every 8 (eight) hours as needed. 30 tablet 0  . ibuprofen (ADVIL) 100 MG tablet Take 100 mg by mouth daily as needed for fever. OTC    . ketoconazole (NIZORAL) 2 % shampoo APPLY  EXTERNALLY 2 TIMES A WEEK. 120 mL 3  . montelukast (SINGULAIR) 10 MG tablet TAKE 1 TABLET(10 MG) BY MOUTH AT BEDTIME 90 tablet 3  . Multiple Vitamin (MULTIVITAMIN) capsule Take 1 capsule by mouth daily. OTC    . rosuvastatin (CRESTOR) 10 MG tablet TAKE 1 TABLET BY MOUTH DAILY 90 tablet 0   No current facility-administered medications for this visit.     Physical Exam:     BP 120/80   Pulse 62   Temp 98.5 F (36.9 C)   Ht 5\' 5"  (1.651 m)   Wt 181 lb (82.1 kg)   BMI 30.12 kg/m   GENERAL:  Pleasant well developed male in NAD PSYCH: : Cooperative, normal affect EENT:  conjunctiva pink, mucous membranes moist, neck supple without masses CARDIAC:  RRR,no murmur heard, no peripheral edema PULM: Normal respiratory effort, lungs CTA bilaterally, no wheezing ABDOMEN:  Nondistended, soft, nontender. No obvious masses, no hepatomegaly,  normal bowel sounds SKIN:  turgor, no lesions seen Musculoskeletal:  Normal muscle tone, normal strength NEURO: Alert and oriented x 3, no focal neurologic deficits   Tye Savoy , NP 09/11/2019, 11:06 AM

## 2019-09-11 NOTE — Patient Instructions (Signed)
If you are age 58 or older, your body mass index should be between 23-30. Your Body mass index is 30.12 kg/m. If this is out of the aforementioned range listed, please consider follow up with your Primary Care Provider.  If you are age 59 or younger, your body mass index should be between 19-25. Your Body mass index is 30.12 kg/m. If this is out of the aformentioned range listed, please consider follow up with your Primary Care Provider.   Trial of Salonpas (this is over-the-counter)  Follow up as needed.  Thank you for choosing me and [facility].   Tye Savoy, NP

## 2019-09-12 NOTE — Progress Notes (Signed)
I agree with the above note, plan 

## 2019-10-16 ENCOUNTER — Encounter (INDEPENDENT_AMBULATORY_CARE_PROVIDER_SITE_OTHER): Payer: Self-pay | Admitting: Ophthalmology

## 2019-10-22 NOTE — Progress Notes (Signed)
[facility] Note  10/23/2019     CHIEF COMPLAINT Patient presents for Retina Evaluation   HISTORY OF PRESENT ILLNESS: Rickey Cruz is a 58 y.o. male who presents to the clinic today for:   HPI    Retina Evaluation    In both eyes.  This started 1 month ago.  Associated Symptoms Negative for Flashes, Pain, Trauma, Fever, Weight Loss, Scalp Tenderness, Redness, Floaters, Distortion, Photophobia, Jaw Claudication, Fatigue, Shoulder/Hip pain, Glare and Blind Spot.  Context:  distance vision, mid-range vision and near vision.  Treatments tried include no treatments.  I, the attending physician,  performed the HPI with the patient and updated documentation appropriately.          Comments    Referral of Dr. Bing Plume for retina eval. Patient states he was DX  prediabetic 2-3 yrs ago, But "I'm not a diabetic, Dr. Bing Plume sent me here for retina degeneration of my right eye". Pt denies flashes, floaters , blurry vision  and ocular pain. Pt is not monitoring Bs @ home. Bs are controlled  by diet and exercise. Last A1C 5.9 , glucose 112  (06/13/19)       Last edited by Bernarda Caffey, MD on 10/25/2019 12:46 PM. (History)    pt states he was referred here by his PCP, for an eye exam bc he told his PCP that his optometrist told him he has macular degeneration, pt states he saw Dr. Posey Pronto and she did not answer his question to his satisfaction about the macular degeneration, pt states he got a glasses rx from [place] but has not gotten it filled  Referring physician: Rutherford Guys, MD [address] [place],  [place] [address]  HISTORICAL INFORMATION:   Selected notes from the MEDICAL RECORD NUMBER Referral from Dr. Grant Fontana for diabetic retinal evaluation  LEE: unknown PMH: DM, last a1c was 5.9 (06.19.20), HTN, GERD   CURRENT MEDICATIONS: No current outpatient medications on file. (Ophthalmic Drugs)   No current facility-administered medications for this visit.   (Ophthalmic Drugs)   Current Outpatient Medications (Other)  Medication Sig  . albuterol (PROAIR HFA) 108 (90 Base) MCG/ACT inhaler Inhale 2 puffs into the lungs every 4 (four) hours as needed for wheezing or shortness of breath.  Marland Kitchen amLODipine (NORVASC) 5 MG tablet TAKE 1 TABLET(5 MG) BY MOUTH DAILY  . aspirin EC 325 MG tablet Take 325 mg by mouth as needed.  . beclomethasone (QVAR) 80 MCG/ACT inhaler Inhale 2 puffs into the lungs 2 (two) times daily.  . clobetasol ointment (TEMOVATE) [id number] % Apply 1 application topically 2 (two) times daily.  . Continuous Blood Gluc Sensor (FREESTYLE LIBRE SENSOR SYSTEM) MISC 1 each by Does not apply route every 14 (fourteen) days.  . fluocinolone (VANOS) 0.01 % cream APPLY EXTERNALLY TO THE AFFECTED AREA TWICE DAILY  . fluocinonide cream (LIDEX) 0.05 % APPLY SPARINGLY TO THE AFFECTED AREA TWICE DAILY  . fluticasone (FLONASE) 50 MCG/ACT nasal spray SHAKE LIQUID AND USE 2 SPRAYS IN EACH NOSTRIL EVERY DAY  . fluticasone (FLOVENT HFA) 110 MCG/ACT inhaler Inhale 2 puffs into the lungs 2 (two) times daily.  . hydrochlorothiazide (HYDRODIURIL) 12.5 MG tablet [facility]  . hyoscyamine (LEVSIN SL) 0.125 MG SL tablet Place 1 tablet (0.125 mg total) under the tongue every 8 (eight) hours as needed.  Marland Kitchen ibuprofen (ADVIL) 100 MG tablet Take 100 mg by mouth daily as needed for fever. OTC  . ketoconazole (NIZORAL) 2 % shampoo APPLY  EXTERNALLY 2 TIMES A WEEK.  . montelukast (SINGULAIR) 10 MG tablet TAKE 1 TABLET(10 MG) BY MOUTH AT BEDTIME  . Multiple Vitamin (MULTIVITAMIN) capsule Take 1 capsule by mouth daily. OTC  . rosuvastatin (CRESTOR) 10 MG tablet TAKE 1 TABLET BY MOUTH DAILY   No current facility-administered medications for this visit.  (Other)      REVIEW OF SYSTEMS: ROS    Positive for: Eyes   Negative for: Constitutional, Gastrointestinal, Neurological, Skin, Genitourinary, Musculoskeletal, HENT, Endocrine, Cardiovascular, Respiratory, Psychiatric,  Allergic/Imm, Heme/Lymph   Last edited by Zenovia Jordan, LPN on [id number]  [id number] AM. (History)       ALLERGIES No Known Allergies  PAST MEDICAL HISTORY Past Medical History:  Diagnosis Date  . Allergy    Flonase, Patanol, Zyrtec  . Arthritis    psoriasis  . Asthma    seasonal, with allergies  . Borderline diabetes   . Cluster headache   . Colon polyp 10/27/2011  . Depression    denies 11/11/16; denies 11/21/18 "been years"  . GERD (gastroesophageal reflux disease)   . History of kidney stones    x1  . Hyperlipidemia   . Hypertension   . Low back pain    "in kidney area-was told that it was related to gallstones"  . Prediabetes   . Psoriasis    [name]  . Severe sleep apnea    h/o; had procedure and weight control, does not have to wear CPAP  . 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 APPENDECTOMY    . CHOLECYSTECTOMY N/A 12/16/2014   Procedure: LAPAROSCOPIC CHOLECYSTECTOMY WITH INTRAOPERATIVE CHOLANGIOGRAM;  Surgeon: [name], MD;  Location: [facility];  Service: General;  Laterality: N/A;  . COLONOSCOPY  10/27/2011   single polyp. Repeat 5 years.  Marland Kitchen POLYPECTOMY    . PROSTATE BIOPSY  10/2014   will have another in 3 months  . SEPTOPLASTY  2014  . TONSILLECTOMY  2014  . UVULOPALATOPHARYNGOPLASTY  2014    FAMILY HISTORY Family History  Problem Relation Age of Onset  . Stroke Mother   . Asthma Sister   . Colon cancer Neg Hx   . Esophageal cancer Neg Hx   . Stomach cancer Neg Hx   . Prostate cancer Neg Hx   . Diabetes Neg Hx   . CAD Neg Hx   . Migraines Neg Hx   . Headache Neg Hx     SOCIAL HISTORY Social History   Tobacco Use  . Smoking status: Never Smoker  . Smokeless tobacco: Never Used  Substance Use Topics  . Alcohol use: Never    Alcohol/week: 0.0 standard drinks    Frequency: Never  . Drug use: Never         OPHTHALMIC EXAM:  Base Eye Exam     Visual Acuity (Snellen - Linear)      Right Left   Dist [place] 20/40 +1 20/20 -1   Dist ph [place] 20/25 -1 20/20       Tonometry (Tonopen, 9:02 AM)      Right Left   Pressure 12 12       Pupils      Dark Light Shape React APD   Right 3 2 Round Brisk None   Left 3 2 Round Brisk None       Visual Fields (Counting fingers)      Left Right    Full Full  Extraocular Movement      Right Left    Full, Ortho Full, Ortho       Neuro/Psych    Oriented x3: Yes       Dilation    Both eyes: 1.0% Mydriacyl, 2.5% Phenylephrine @ 9:02 AM        Slit Lamp and Fundus Exam    Slit Lamp Exam      Right Left   Lids/Lashes Dermatochalasis - upper lid, mild Meibomian gland dysfunction, Telangiectasia Dermatochalasis - upper lid, mild Meibomian gland dysfunction, Telangiectasia   Conjunctiva/Sclera Mild inferior Conjunctivochalasis Mild inferior Conjunctivochalasis   Cornea Mild Arcus, 1+ Punctate epithelial erosions Mild Arcus, 1+ Punctate epithelial erosions   Anterior Chamber Deep and quiet Deep and quiet   Iris Round and dilated Round and dilated   Lens 2+ Nuclear sclerosis, 2+ Cortical cataract 2+ Nuclear sclerosis, 2+ Cortical cataract   Vitreous Vitreous syneresis Vitreous syneresis       Fundus Exam      Right Left   Disc Pink and Sharp, mild temporal PPP Pink and Sharp, mild temporal PPP   C/D Ratio 0.5 0.5   Macula Flat, Good foveal reflex, focal solitary druse IT to fovea, mild Retinal pigment epithelial mottling, No heme or edema Flat, Good foveal reflex, mild Retinal pigment epithelial mottling, No heme or edema   Vessels Mild Vascular attenuation Mild Vascular attenuation, mild Tortuousity   Periphery Attached, No heme  Attached, No heme           IMAGING AND PROCEDURES  Imaging and Procedures for @TODAY @  OCT, Retina - OU - Both Eyes       Right Eye Quality was good. Central Foveal Thickness: 327. Progression has no prior data. Findings include normal foveal  contour, no SRF, no IRF, vitreomacular adhesion  (Focal druse IT to fovea).   Left Eye Quality was good. Central Foveal Thickness: 292. Progression has no prior data. Findings include normal foveal contour, no IRF, no SRF.   Notes *Images captured and stored on drive  Diagnosis / Impression:  NFP, no IRF/SRF OU No DME OU  Clinical management:  See below  Abbreviations: NFP - Normal foveal profile. CME - cystoid macular edema. PED - pigment epithelial detachment. IRF - intraretinal fluid. SRF - subretinal fluid. EZ - ellipsoid zone. ERM - epiretinal membrane. ORA - outer retinal atrophy. ORT - outer retinal tubulation. SRHM - subretinal hyper-reflective material                 ASSESSMENT/PLAN:    ICD-10-CM   1. 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 Pre-diabetes  R73.03   3. Retinal edema  H35.81 OCT, Retina - OU - Both Eyes  4. Essential hypertension  I10   5. Hypertensive retinopathy of both eyes  H35.033   6. Combined forms of age-related cataract of both eyes  H25.813     1. Retinal drusen OD  - focal, isolated macular drusen  - no visual significance  - discussed findings, prognosis and possible progression to age-related macular degeneration  - no intervention indicated  - monitor  2,3. Pre-diabetes mellitus without retinopathy  - The incidence, risk factors for progression, natural history and treatment options for diabetic retinopathy  were discussed with patient.    - The need for close monitoring of blood glucose, blood pressure, and serum lipids, avoiding cigarette or any type of tobacco, and the need for long term follow up was also discussed with patient.  -  f/u in 1 year, sooner prn  4,5. Hypertensive retinopathy OU  - discussed importance of tight BP control  - monitor  6. Mixed form age related cataract OU  - The symptoms of cataract, surgical options, and treatments and risks were discussed with patient.  - discussed diagnosis and  progression  - not yet visually significant  - monitor for now  **Pt wishes to [facility] to [facility] -- will facilitate referral**   Ophthalmic Meds Ordered this visit:  No orders of the defined types were placed in this encounter.      Return in about 1 year (around 10/22/2020) for f/u retinal drusen, DFE, OCT.  There are no Patient Instructions on file for this visit.  This document serves as a record of services personally performed by Gardiner Sleeper, MD, PhD. It was created on their behalf by Roselee Nova, COMT. The creation of this record is the provider's dictation and/or activities during the visit.  Electronically signed by: Roselee Nova, COMT 10/25/19 12:47 PM     Explained the diagnoses, plan, and follow up with the patient and they expressed understanding.  Patient expressed understanding of the importance of proper follow up care.   Gardiner Sleeper, M.D., Ph.D. Diseases & Surgery of the Retina and Vitreous [facility]  I have reviewed the above documentation for accuracy and completeness, and I agree with the above. Gardiner Sleeper, M.D., Ph.D. 10/25/19 12:47 PM    Abbreviations: M myopia (nearsighted); A astigmatism; H hyperopia (farsighted); P presbyopia; Mrx spectacle prescription;  CTL contact lenses; OD right eye; OS left eye; OU both eyes  XT exotropia; ET esotropia; PEK punctate epithelial keratitis; PEE punctate epithelial erosions; DES dry eye syndrome; MGD meibomian gland dysfunction; ATs artificial tears; PFAT's preservative free artificial tears; [place] nuclear sclerotic cataract; PSC posterior subcapsular cataract; ERM epi-retinal membrane; PVD posterior vitreous detachment; RD retinal detachment; DM diabetes mellitus; DR diabetic retinopathy; NPDR non-proliferative diabetic retinopathy; PDR proliferative diabetic retinopathy; CSME clinically significant macular edema; DME diabetic macular edema; dbh dot blot  hemorrhages; CWS cotton wool spot; POAG primary open angle glaucoma; C/D cup-to-disc ratio; HVF humphrey visual field; GVF goldmann visual field; OCT optical coherence tomography; IOP intraocular pressure; BRVO Branch retinal vein occlusion; CRVO central retinal vein occlusion; CRAO central retinal artery occlusion; BRAO branch retinal artery occlusion; RT retinal tear; SB scleral buckle; PPV pars plana vitrectomy; VH Vitreous hemorrhage; PRP panretinal laser photocoagulation; IVK intravitreal kenalog; VMT vitreomacular traction; MH Macular hole;  NVD neovascularization of the disc; NVE neovascularization elsewhere; AREDS age related eye disease study; ARMD age related macular degeneration; POAG primary open angle glaucoma; EBMD epithelial/anterior basement membrane dystrophy; ACIOL anterior chamber intraocular lens; IOL intraocular lens; PCIOL posterior chamber intraocular lens; Phaco/IOL phacoemulsification with intraocular lens placement; [place] photorefractive keratectomy; LASIK laser assisted in situ keratomileusis; HTN hypertension; DM diabetes mellitus; COPD chronic obstructive pulmonary disease

## 2019-10-23 ENCOUNTER — Ambulatory Visit (INDEPENDENT_AMBULATORY_CARE_PROVIDER_SITE_OTHER): Payer: BC Managed Care – PPO | Admitting: Ophthalmology

## 2019-10-23 ENCOUNTER — Encounter (INDEPENDENT_AMBULATORY_CARE_PROVIDER_SITE_OTHER): Payer: Self-pay | Admitting: Ophthalmology

## 2019-10-23 DIAGNOSIS — I10 Essential (primary) hypertension: Secondary | ICD-10-CM | POA: Diagnosis not present

## 2019-10-23 DIAGNOSIS — R7303 Prediabetes: Secondary | ICD-10-CM | POA: Diagnosis not present

## 2019-10-23 DIAGNOSIS — H35361 Drusen (degenerative) of macula, right eye: Secondary | ICD-10-CM

## 2019-10-23 DIAGNOSIS — H3581 Retinal edema: Secondary | ICD-10-CM | POA: Diagnosis not present

## 2019-10-23 DIAGNOSIS — H35033 Hypertensive retinopathy, bilateral: Secondary | ICD-10-CM

## 2019-10-23 DIAGNOSIS — H25813 Combined forms of age-related cataract, bilateral: Secondary | ICD-10-CM

## 2019-10-25 ENCOUNTER — Encounter (INDEPENDENT_AMBULATORY_CARE_PROVIDER_SITE_OTHER): Payer: Self-pay | Admitting: Ophthalmology

## 2019-10-29 ENCOUNTER — Other Ambulatory Visit: Payer: Self-pay | Admitting: Family Medicine

## 2019-10-30 ENCOUNTER — Telehealth: Payer: Self-pay | Admitting: *Deleted

## 2019-10-30 MED ORDER — HYDROCHLOROTHIAZIDE 12.5 MG PO TABS: ORAL_TABLET | ORAL | 0 refills | Status: DC

## 2019-10-30 NOTE — Telephone Encounter (Signed)
Sent to scheduling pool.

## 2019-10-31 NOTE — Telephone Encounter (Signed)
lvmtcb and schedule appt

## 2019-11-07 LAB — HM DIABETES EYE EXAM

## 2019-11-11 ENCOUNTER — Encounter: Payer: Self-pay | Admitting: *Deleted

## 2019-12-06 ENCOUNTER — Other Ambulatory Visit: Payer: Self-pay

## 2019-12-06 ENCOUNTER — Ambulatory Visit: Payer: BC Managed Care – PPO | Admitting: Family Medicine

## 2019-12-06 ENCOUNTER — Encounter: Payer: Self-pay | Admitting: Family Medicine

## 2019-12-06 VITALS — BP 138/90 | HR 55 | Temp 98.4°F | Ht <= 58 in | Wt 178.0 lb

## 2019-12-06 DIAGNOSIS — I10 Essential (primary) hypertension: Secondary | ICD-10-CM | POA: Diagnosis not present

## 2019-12-06 DIAGNOSIS — R972 Elevated prostate specific antigen [PSA]: Secondary | ICD-10-CM

## 2019-12-06 DIAGNOSIS — R7302 Impaired glucose tolerance (oral): Secondary | ICD-10-CM

## 2019-12-06 DIAGNOSIS — E7800 Pure hypercholesterolemia, unspecified: Secondary | ICD-10-CM | POA: Diagnosis not present

## 2019-12-06 DIAGNOSIS — M7918 Myalgia, other site: Secondary | ICD-10-CM

## 2019-12-06 DIAGNOSIS — Z114 Encounter for screening for human immunodeficiency virus [HIV]: Secondary | ICD-10-CM

## 2019-12-06 MED ORDER — ROSUVASTATIN CALCIUM 10 MG PO TABS: ORAL_TABLET | ORAL | 0 refills | Status: DC

## 2019-12-06 MED ORDER — CYCLOBENZAPRINE HCL 10 MG PO TABS: 10.0000 mg | ORAL_TABLET | Freq: Three times a day (TID) | ORAL | 0 refills | Status: DC | PRN

## 2019-12-06 MED ORDER — FLUOCINONIDE 0.05 % EX CREA: TOPICAL_CREAM | CUTANEOUS | 1 refills | Status: DC

## 2019-12-06 MED ORDER — CLOBETASOL PROPIONATE 0.05 % EX OINT: 1.0000 "application " | TOPICAL_OINTMENT | Freq: Two times a day (BID) | CUTANEOUS | 1 refills | Status: DC

## 2019-12-06 MED ORDER — HYDROCHLOROTHIAZIDE 12.5 MG PO TABS: ORAL_TABLET | ORAL | 0 refills | Status: DC

## 2019-12-06 MED ORDER — MELOXICAM 15 MG PO TABS: 15.0000 mg | ORAL_TABLET | Freq: Every day | ORAL | 0 refills | Status: DC

## 2019-12-06 MED ORDER — AMLODIPINE BESYLATE 5 MG PO TABS: ORAL_TABLET | ORAL | 1 refills | Status: DC

## 2019-12-06 MED ORDER — FLUOCINOLONE ACETONIDE 0.01 % EX CREA: TOPICAL_CREAM | Freq: Every day | CUTANEOUS | 1 refills | Status: DC

## 2019-12-06 NOTE — Patient Instructions (Signed)
° ° ° °  If you have lab work done today you will be contacted with your lab results within the next 2 weeks.  If you have not heard from us then please contact us. The fastest way to get your results is to register for My Chart. ° ° °IF you received an x-ray today, you will receive an invoice from [facility]. Please contact [facility] at [phone] with questions or concerns regarding your invoice.  ° °IF you received labwork today, you will receive an invoice from [facility]. Please contact [facility] at [phone] with questions or concerns regarding your invoice.  ° °Our billing staff will not be able to assist you with questions regarding bills from these companies. ° °You will be contacted with the lab results as soon as they are available. The fastest way to get your results is to activate your My Chart account. Instructions are located on the last page of this paperwork. If you have not heard from us regarding the results in 2 weeks, please contact this office. °  ° ° ° °

## 2019-12-06 NOTE — Progress Notes (Signed)
12/11/202012:13 PM  Rickey Cruz 1961/01/20, 58 y.o., male [id number]  Chief Complaint  Patient presents with  . Follow-up    chronic condition, also having pain in back on the left side, was told that it was muscle pain. Says he gets bloated and the pain is very aggravating. Went to gastro and was told all is well. Wants testing done    HPI:   Patient is a 58 y.o. male with past medical history significant for prediabetes, HTN, HLP, elevated PSA, OSA, GERD, asthma, seasonal allergies, psoariasis  who presents today for routine followup  Last OV June 2020 Saw retinal specialist oct 2020 - HTN retinopathy, checks BP at home Saw pulm/sleep in aug 2020 - sleep study unremarkable, no cpap Saw neuro aug 2020 - cluster headaches, no changes  Overall he is doing well He has having left waist pain just above hip, does not radiate Intermittent, nagging pain on most days Had ct scan abd/pelvis in sept - 64mm kidney stones, diverticulosis, otherwise unremarkable  He is otherwise taking his meds as prescribed Denies any side effects  Lab Results  Component Value Date   HGBA1C 5.9 (H) 06/13/2019   HGBA1C 5.9 (H) 11/01/2018   HGBA1C 5.8 04/30/2018   Lab Results  Component Value Date   MICROALBUR 1.7 05/19/2016   LDLCALC 68 06/13/2019   CREATININE 1.09 08/20/2019    Depression screen PHQ 2/9 12/06/2019 07/24/2019 06/13/2019  Decreased Interest 0 0 0  Down, Depressed, Hopeless 0 0 0  PHQ - 2 Score 0 0 0    Fall Risk  12/06/2019 07/24/2019 06/13/2019 04/02/2019 11/01/2018  Falls in the past year? 0 - 0 0 0  Number falls in past yr: 0 0 0 - -  Injury with Fall? 0 0 0 - -     No Known Allergies  Prior to Admission medications   Medication Sig Start Date End Date Taking? Authorizing Provider  albuterol (PROAIR HFA) 108 (90 Base) MCG/ACT inhaler Inhale 2 puffs into the lungs every 4 (four) hours as needed for wheezing or shortness of breath. 11/25/17  Yes [name], MD  amLODipine  (NORVASC) 5 MG tablet TAKE 1 TABLET(5 MG) BY MOUTH DAILY 08/13/19  Yes Rutherford Guys, MD  aspirin EC 325 MG tablet Take 325 mg by mouth as needed.   Yes [provider]  beclomethasone (QVAR) 80 MCG/ACT inhaler Inhale 2 puffs into the lungs 2 (two) times daily. 11/25/17  Yes [name], MD  clobetasol ointment (TEMOVATE) [id number] % Apply 1 application topically 2 (two) times daily.   Yes [provider]  Continuous Blood Gluc Sensor ([place]) MISC 1 each by Does not apply route every 14 (fourteen) days. 11/01/18  Yes Rutherford Guys, MD  fluocinolone (VANOS) 0.01 % cream APPLY EXTERNALLY TO THE AFFECTED AREA TWICE DAILY 01/01/19  Yes Rutherford Guys, MD  fluocinonide cream (LIDEX) 0.05 % APPLY SPARINGLY TO THE AFFECTED AREA TWICE DAILY 07/17/19  Yes Rutherford Guys, MD  fluticasone [facility]) 50 MCG/ACT nasal spray SHAKE LIQUID AND USE 2 SPRAYS IN EACH NOSTRIL EVERY DAY 11/25/17  Yes [name], MD  fluticasone (FLOVENT HFA) 110 MCG/ACT inhaler Inhale 2 puffs into the lungs 2 (two) times daily. 04/30/18  Yes [name], MD  hydrochlorothiazide (HYDRODIURIL) 12.5 MG tablet [facility] 10/30/19  Yes Rutherford Guys, MD  hyoscyamine (LEVSIN SL) 0.125 MG SL tablet Place 1 tablet (0.125 mg total) under the tongue every 8 (  eight) hours as needed. 08/20/19  Yes Willia Craze, NP  ibuprofen (ADVIL) 100 MG tablet Take 100 mg by mouth daily as needed for fever. OTC   Yes [provider]  ketoconazole (NIZORAL) 2 % shampoo APPLY EXTERNALLY 2 TIMES A WEEK. 11/25/17  Yes [name], MD  montelukast (SINGULAIR) 10 MG tablet TAKE 1 TABLET(10 MG) BY MOUTH AT BEDTIME 11/25/17  Yes [name], MD  Multiple Vitamin (MULTIVITAMIN) capsule Take 1 capsule by mouth daily. OTC   Yes [provider]  rosuvastatin (CRESTOR) 10 MG tablet TAKE 1 TABLET BY MOUTH DAILY 08/16/19  Yes Rutherford Guys, MD    Past Medical History:  Diagnosis Date  .  Allergy    Flonase, Patanol, Zyrtec  . Arthritis    psoriasis  . Asthma    seasonal, with allergies  . Borderline diabetes   . Cluster headache   . Colon polyp 10/27/2011  . Depression    denies 11/11/16; denies 11/21/18 "been years"  . GERD (gastroesophageal reflux disease)   . History of kidney stones    x1  . Hyperlipidemia   . Hypertension   . Low back pain    "in kidney area-was told that it was related to gallstones"  . Prediabetes   . Psoriasis    [name]  . Severe sleep apnea    h/o; had procedure and weight control, does not have to wear CPAP  . 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 APPENDECTOMY    . CHOLECYSTECTOMY N/A 12/16/2014   Procedure: LAPAROSCOPIC CHOLECYSTECTOMY WITH INTRAOPERATIVE CHOLANGIOGRAM;  Surgeon: [name], MD;  Location: [facility];  Service: General;  Laterality: N/A;  . COLONOSCOPY  10/27/2011   single polyp. Repeat 5 years.  Marland Kitchen POLYPECTOMY    . PROSTATE BIOPSY  10/2014   will have another in 3 months  . SEPTOPLASTY  2014  . TONSILLECTOMY  2014  . UVULOPALATOPHARYNGOPLASTY  2014    Social History   Tobacco Use  . Smoking status: Never Smoker  . Smokeless tobacco: Never Used  Substance Use Topics  . Alcohol use: Never    Alcohol/week: 0.0 standard drinks    Family History  Problem Relation Age of Onset  . Stroke Mother   . Asthma Sister   . Colon cancer Neg Hx   . Esophageal cancer Neg Hx   . Stomach cancer Neg Hx   . Prostate cancer Neg Hx   . Diabetes Neg Hx   . CAD Neg Hx   . Migraines Neg Hx   . Headache Neg Hx     Review of Systems  Constitutional: Negative for chills and fever.  Respiratory: Negative for cough and shortness of breath.   Cardiovascular: Negative for chest pain, palpitations and leg swelling.  Gastrointestinal: Negative for abdominal pain, nausea and vomiting.   Per hpi  OBJECTIVE:  Today's Vitals    12/06/19 1206  BP: 138/90  Pulse: (!) 55  Temp: 98.4 F (36.9 C)  SpO2: 95%  Weight: 178 lb (80.7 kg)  Height: 1' (0.305 m)   Body mass index is 869.08 kg/m.   Physical Exam Vitals and nursing note reviewed.  Constitutional:      Appearance: He is well-developed.  HENT:     Head: Normocephalic and atraumatic.  Eyes:     Conjunctiva/sclera: Conjunctivae normal.     Pupils: Pupils are equal, round, and  reactive to light.  Cardiovascular:     Rate and Rhythm: Normal rate and regular rhythm.     Heart sounds: No murmur. No friction rub. No gallop.   Pulmonary:     Effort: Pulmonary effort is normal.     Breath sounds: Normal breath sounds. No wheezing or rales.  Musculoskeletal:     Cervical back: Neck supple.     Thoracic back: Normal.     Lumbar back: Tenderness present. No bony tenderness. Normal range of motion.       Back:     Right lower leg: No edema.     Left lower leg: No edema.  Skin:    General: Skin is warm and dry.  Neurological:     Mental Status: He is alert and oriented to person, place, and time.     No results found for this or any previous visit (from the past 24 hour(s)).  No results found.   ASSESSMENT and PLAN  1. Essential hypertension Controlled. Continue current regime.  - CBC  2. Glucose intolerance (impaired glucose tolerance) Labs pending. Cont LFM - TSH - Hemoglobin A1c  3. Pure hypercholesterolemia Checking labs today, medications will be adjusted as needed.  - Lipid panel - CMET with GFR  4. Elevated PSA - PSA  5. Screening for HIV (human immunodeficiency virus) - HIV antibody  6. Myofascial pain on left side Discussed conservative measures. Stretching and strengthening. meds r/se/b, consider PT  Other orders - amLODipine (NORVASC) 5 MG tablet; TAKE 1 TABLET(5 MG) BY MOUTH DAILY - clobetasol ointment (TEMOVATE) 0.05 %; Apply 1 application topically 2 (two) times daily. - fluocinolone (VANOS) 0.01 % cream; Apply  topically daily. - fluocinonide cream (LIDEX) 0.05 %; APPLY SPARINGLY TO THE AFFECTED AREA TWICE DAILY - hydrochlorothiazide (HYDRODIURIL) 12.5 MG tablet; Jerric Syring - rosuvastatin (CRESTOR) 10 MG tablet; TAKE 1 TABLET BY MOUTH DAILY - cyclobenzaprine (FLEXERIL) 10 MG tablet; Take 1 tablet (10 mg total) by mouth 3 (three) times daily as needed for muscle spasms. - meloxicam (MOBIC) 15 MG tablet; Take 1 tablet (15 mg total) by mouth daily.  Return in about 6 months (around 06/05/2020).    Rutherford Guys, MD Primary Care at [place], [place] [address] Ph.  [phone] Fax [phone]

## 2019-12-07 ENCOUNTER — Encounter: Payer: Self-pay | Admitting: Family Medicine

## 2019-12-07 LAB — CMP14+EGFR
ALT: 40 IU/L (ref 0–44)
AST: 30 IU/L (ref 0–40)
Albumin/Globulin Ratio: 2 (ref 1.2–2.2)
Albumin: 4.5 g/dL (ref 3.8–4.9)
Alkaline Phosphatase: 51 IU/L (ref 39–117)
BUN/Creatinine Ratio: 16 (ref 9–20)
BUN: 15 mg/dL (ref 6–24)
Bilirubin Total: 0.5 mg/dL (ref 0.0–1.2)
CO2: 23 mmol/L (ref 20–29)
Calcium: 9.3 mg/dL (ref 8.7–10.2)
Chloride: 104 mmol/L (ref 96–106)
Creatinine, Ser: 0.92 mg/dL (ref 0.76–1.27)
GFR calc Af Amer: 106 mL/min/{1.73_m2} (ref >59)
GFR calc non Af Amer: 91 mL/min/{1.73_m2} (ref >59)
Globulin, Total: 2.2 g/dL (ref 1.5–4.5)
Glucose: 93 mg/dL (ref 65–99)
Potassium: 3.5 mmol/L (ref 3.5–5.2)
Sodium: 141 mmol/L (ref 134–144)
Total Protein: 6.7 g/dL (ref 6.0–8.5)

## 2019-12-07 LAB — LIPID PANEL
Chol/HDL Ratio: 2.9 ratio (ref 0.0–5.0)
Cholesterol, Total: 131 mg/dL (ref 100–199)
HDL: 45 mg/dL (ref >39)
LDL Chol Calc (NIH): 63 mg/dL (ref 0–99)
Triglycerides: 132 mg/dL (ref 0–149)
VLDL Cholesterol Cal: 23 mg/dL (ref 5–40)

## 2019-12-07 LAB — CBC
Hematocrit: 47.9 % (ref 37.5–51.0)
Hemoglobin: 16.6 g/dL (ref 13.0–17.7)
MCH: 31.2 pg (ref 26.6–33.0)
MCHC: 34.7 g/dL (ref 31.5–35.7)
MCV: 90 fL (ref 79–97)
Platelets: 249 10*3/uL (ref 150–450)
RBC: 5.32 x10E6/uL (ref 4.14–5.80)
RDW: 13.2 % (ref 11.6–15.4)
WBC: 4.5 10*3/uL (ref 3.4–10.8)

## 2019-12-07 LAB — HEMOGLOBIN A1C
Est. average glucose Bld gHb Est-mCnc: 114 mg/dL
Hgb A1c MFr Bld: 5.6 % (ref 4.8–5.6)

## 2019-12-07 LAB — PSA: Prostate Specific Ag, Serum: 6.4 ng/mL — ABNORMAL HIGH (ref 0.0–4.0)

## 2019-12-07 LAB — TSH: TSH: 2.09 u[IU]/mL (ref 0.450–4.500)

## 2019-12-07 LAB — HIV ANTIBODY (ROUTINE TESTING W REFLEX): HIV Screen 4th Generation wRfx: NONREACTIVE

## 2019-12-09 MED ORDER — CLOBETASOL PROPIONATE 0.05 % EX FOAM: Freq: Two times a day (BID) | CUTANEOUS | 3 refills | Status: DC

## 2019-12-09 NOTE — Addendum Note (Signed)
Addended by: Rutherford Guys on: 12/09/2019 10:49 AM   Modules accepted: Orders

## 2019-12-30 ENCOUNTER — Other Ambulatory Visit: Payer: Self-pay | Admitting: Family Medicine

## 2019-12-30 NOTE — Telephone Encounter (Signed)
Requested medication (s) are due for refill today: no  Requested medication (s) are on the active medication list: yes  Last refill:  12/18/2019  Future visit scheduled: yes  Notes to clinic:  Medication not assigned to a protocol, review manually   Requested Prescriptions  Pending Prescriptions Disp Refills   fluocinonide cream (LIDEX) 0.05 % [Pharmacy Med Name: FLUOCINONIDE 0.05% CREAM 30GM] 30 g 1    Sig: APPLY SPARINGLY EXTERNALLY TO THE AFFECTED AREA TWICE DAILY      Off-Protocol Failed - 12/30/2019  3:45 AM      Failed - Medication not assigned to a protocol, review manually.      Passed - Valid encounter within last 12 months    Recent Outpatient Visits           3 weeks ago Essential hypertension   [facility], Lilia Argue, MD   5 months ago Abdominal pain, left upper quadrant   [facility] Care at [facility], Rex Kras, MD   6 months ago Annual physical exam   [facility], Lilia Argue, MD   9 months ago Right foot pain   Primary Care at [facility], Ines Bloomer, MD   1 year ago Prediabetes   [facility] at [facility], Lilia Argue, MD       Future Appointments             In 5 months Rutherford Guys, MD Primary Care at [place], [facility]

## 2020-01-01 ENCOUNTER — Other Ambulatory Visit: Payer: Self-pay | Admitting: Urology

## 2020-01-01 DIAGNOSIS — R972 Elevated prostate specific antigen [PSA]: Secondary | ICD-10-CM

## 2020-01-25 ENCOUNTER — Other Ambulatory Visit: Payer: Self-pay

## 2020-01-25 ENCOUNTER — Ambulatory Visit
Admission: RE | Admit: 2020-01-25 | Discharge: 2020-01-25 | Disposition: A | Discharge status: Home / Self Care | Payer: BC Managed Care – PPO | Source: Ambulatory Visit | Attending: Urology | Admitting: Urology

## 2020-01-25 DIAGNOSIS — R972 Elevated prostate specific antigen [PSA]: Secondary | ICD-10-CM

## 2020-01-25 IMAGING — MR MR PROSTATE WO/W CM
56 series · 56 of 56 positions shown · IV contrast (multihance)
Comparison: [DATE]
COMPARISON: [DATE]

Addendum:
CLINICAL DATA: History of elevated PSA, latest PSA of 6.4. Previous
biopsy results from [DATE] showing small focus of atypia at the
right base.

EXAM:
MR PROSTATE WITHOUT AND WITH CONTRAST
TECHNIQUE: Multiplanar multisequence MRI images were obtained of the pelvis
centered about the prostate. Pre and post contrast images were
obtained.
CONTRAST:  16mL MULTIHANCE GADOBENATE DIMEGLUMINE 529 MG/ML IV SOLN

[Series 3: bSSFP fat-sat · axial · 8.0mm · 0.74mm/px · 1 of 28 slices shown]
[im 1/28]
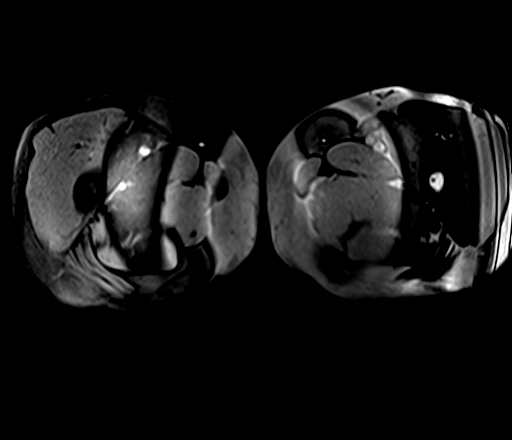

[Series 4: T1 · axial · 5.0mm · 1.25mm/px · 1 of 80 slices shown]
[im 1/80]
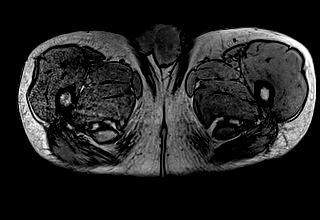

[Series 5: T2 · coronal · 3.5mm · 0.56mm/px · 1 of 23 slices shown (1 of 3)]
[im 1/23]
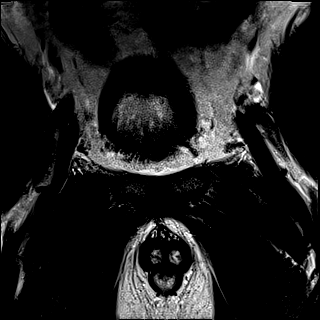

[Series 7: DWI · axial · 3.5mm · 1.75mm/px · 1 of 20 slices shown (1 of 2)]
[im 1/20]
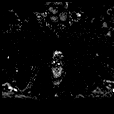

[Series 8: DWI · axial · 3.5mm · 1.56mm/px · 1 of 20 slices shown (2 of 2)]
[im 1/20]
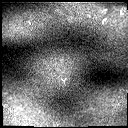

[Series 9: T2 · axial · 3.5mm · 0.56mm/px · 1 of 23 slices shown (2 of 3)]
[im 1/23]
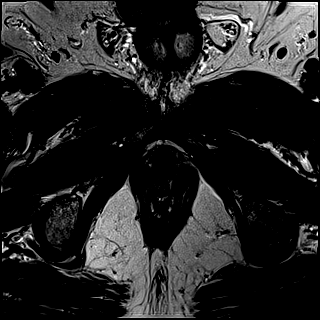

[Series 10: T2 · axial · 1.0mm · 1.04mm/px · 1 of 80 slices shown (3 of 3)]
[im 1/80]
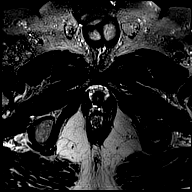

[Series 11: pre t1_twist_tra_dyn_ttc=5.3s · axial · non-contrast · 3.5mm · 0.83mm/px · 1 of 20 slices shown]
[im 1/20]
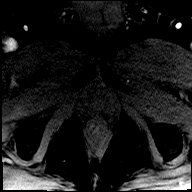

[Series 12: post t1_twist_tra_dyn-copy center · axial · 3.5mm · 0.83mm/px · 1 of 20 slices shown (1 of 25)]
[im 1/20]
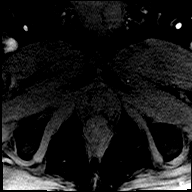

[Series 13: post t1_twist_tra_dyn-copy center · axial · 3.5mm · 0.83mm/px · 1 of 20 slices shown (2 of 25)]
[im 1/20]
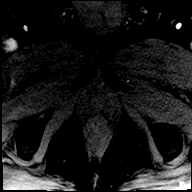

[Series 14: post t1_twist_tra_dyn-copy cent_sub_ttc=(id) · axial · 3.5mm · 0.83mm/px · 1 of 20 slices shown (1 of 23)]
[im 1/20]
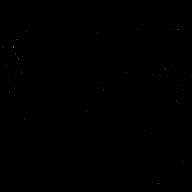

[Series 15: post t1_twist_tra_dyn-copy center · axial · 3.5mm · 0.83mm/px · 1 of 20 slices shown (3 of 25)]
[im 1/20]
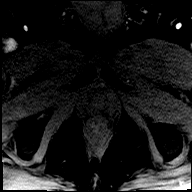

[Series 16: post t1_twist_tra_dyn-copy cent_sub_ttc=(id) · axial · 3.5mm · 0.83mm/px · 1 of 20 slices shown (2 of 23)]
[im 1/20]
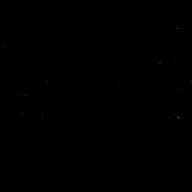

[Series 17: post t1_twist_tra_dyn-copy center · axial · 3.5mm · 0.83mm/px · 1 of 20 slices shown (4 of 25)]
[im 1/20]
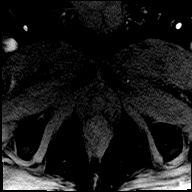

[Series 18: post t1_twist_tra_dyn-copy cent_sub_ttc=(id) · axial · 3.5mm · 0.83mm/px · 1 of 20 slices shown (3 of 23)]
[im 1/20]
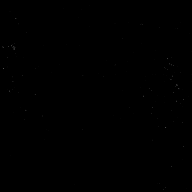

[Series 19: post t1_twist_tra_dyn-copy center · axial · 3.5mm · 0.83mm/px · 1 of 20 slices shown (5 of 25)]
[im 1/20]
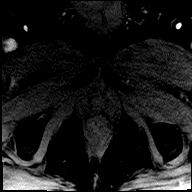

[Series 20: post t1_twist_tra_dyn-copy cent_sub_ttc=(id) · axial · 3.5mm · 0.83mm/px · 1 of 20 slices shown (4 of 23)]
[im 1/20]
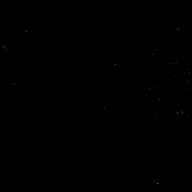

[Series 21: post t1_twist_tra_dyn-copy center · axial · 3.5mm · 0.83mm/px · 1 of 20 slices shown (6 of 25)]
[im 1/20]
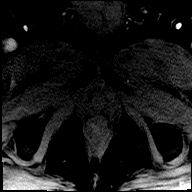

[Series 22: post t1_twist_tra_dyn-copy cent_sub_ttc=(id) · axial · 3.5mm · 0.83mm/px · 1 of 20 slices shown (5 of 23)]
[im 1/20]
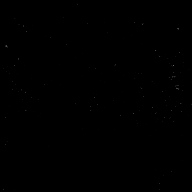

[Series 23: post t1_twist_tra_dyn-copy center · axial · 3.5mm · 0.83mm/px · 1 of 20 slices shown (7 of 25)]
[im 1/20]
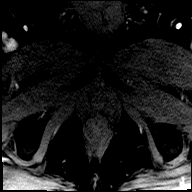

[Series 24: post t1_twist_tra_dyn-copy cent_sub_ttc=(id) · axial · 3.5mm · 0.83mm/px · 1 of 20 slices shown (6 of 23)]
[im 1/20]
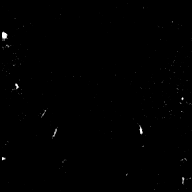

[Series 25: post t1_twist_tra_dyn-copy center · axial · 3.5mm · 0.83mm/px · 1 of 20 slices shown (8 of 25)]
[im 1/20]
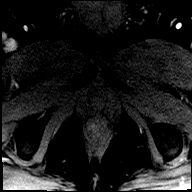

[Series 26: post t1_twist_tra_dyn-copy cent_sub_ttc=(id) · axial · 3.5mm · 0.83mm/px · 1 of 20 slices shown (7 of 23)]
[im 1/20]
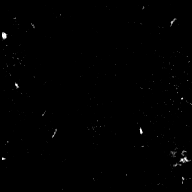

[Series 27: post t1_twist_tra_dyn-copy center · axial · 3.5mm · 0.83mm/px · 1 of 20 slices shown (9 of 25)]
[im 1/20]
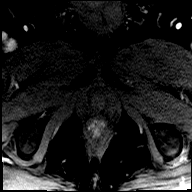

[Series 28: post t1_twist_tra_dyn-copy cent_sub_ttc=(id) · axial · 3.5mm · 0.83mm/px · 1 of 20 slices shown (8 of 23)]
[im 1/20]
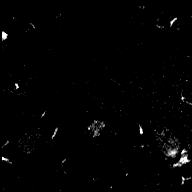

[Series 29: post t1_twist_tra_dyn-copy center · axial · 3.5mm · 0.83mm/px · 1 of 20 slices shown (10 of 25)]
[im 1/20]
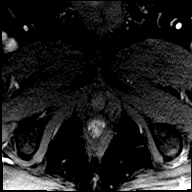

[Series 30: post t1_twist_tra_dyn-copy cent_sub_ttc=(id) · axial · 3.5mm · 0.83mm/px · 1 of 20 slices shown (9 of 23)]
[im 1/20]
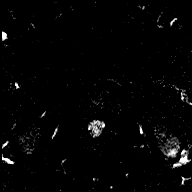

[Series 31: post t1_twist_tra_dyn-copy center · axial · 3.5mm · 0.83mm/px · 1 of 20 slices shown (11 of 25)]
[im 1/20]
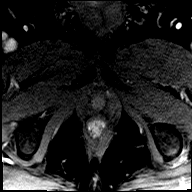

[Series 32: post t1_twist_tra_dyn-copy cent_sub_ttc=(id) · axial · 3.5mm · 0.83mm/px · 1 of 20 slices shown (10 of 23)]
[im 1/20]
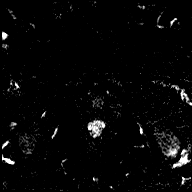

[Series 33: post t1_twist_tra_dyn-copy center · axial · 3.5mm · 0.83mm/px · 1 of 20 slices shown (12 of 25)]
[im 1/20]
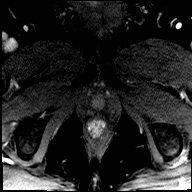

[Series 34: post t1_twist_tra_dyn-copy cent_sub_ttc=(id) · axial · 3.5mm · 0.83mm/px · 1 of 20 slices shown (11 of 23)]
[im 1/20]
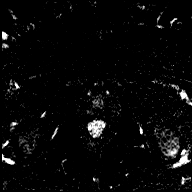

[Series 35: post t1_twist_tra_dyn-copy center · axial · 3.5mm · 0.83mm/px · 1 of 20 slices shown (13 of 25)]
[im 1/20]
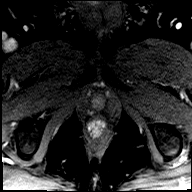

[Series 36: post t1_twist_tra_dyn-copy cent_sub_ttc=(id) · axial · 3.5mm · 0.83mm/px · 1 of 20 slices shown (12 of 23)]
[im 1/20]
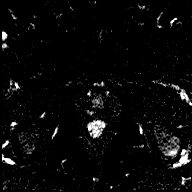

[Series 37: post t1_twist_tra_dyn-copy center · axial · 3.5mm · 0.83mm/px · 1 of 20 slices shown (14 of 25)]
[im 1/20]
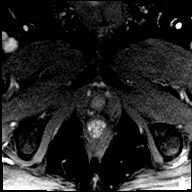

[Series 38: post t1_twist_tra_dyn-copy cent_sub_ttc=(id) · axial · 3.5mm · 0.83mm/px · 1 of 20 slices shown (13 of 23)]
[im 1/20]
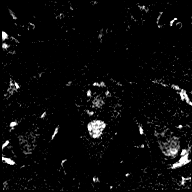

[Series 39: post t1_twist_tra_dyn-copy center · axial · 3.5mm · 0.83mm/px · 1 of 20 slices shown (15 of 25)]
[im 1/20]
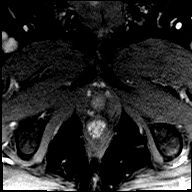

[Series 40: post t1_twist_tra_dyn-copy cent_sub_ttc=(id) · axial · 3.5mm · 0.83mm/px · 1 of 20 slices shown (14 of 23)]
[im 1/20]
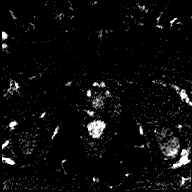

[Series 41: post t1_twist_tra_dyn-copy center · axial · 3.5mm · 0.83mm/px · 1 of 20 slices shown (16 of 25)]
[im 1/20]
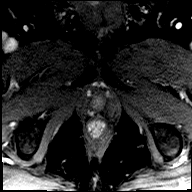

[Series 42: post t1_twist_tra_dyn-copy cent_sub_ttc=(id) · axial · 3.5mm · 0.83mm/px · 1 of 20 slices shown (15 of 23)]
[im 1/20]
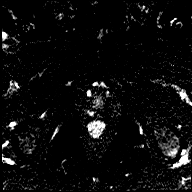

[Series 43: post t1_twist_tra_dyn-copy center · axial · 3.5mm · 0.83mm/px · 1 of 20 slices shown (17 of 25)]
[im 1/20]
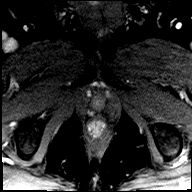

[Series 44: post t1_twist_tra_dyn-copy cent_sub_ttc=(id) · axial · 3.5mm · 0.83mm/px · 1 of 20 slices shown (16 of 23)]
[im 1/20]
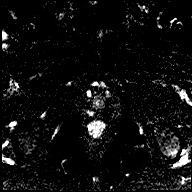

[Series 45: post t1_twist_tra_dyn-copy center · axial · 3.5mm · 0.83mm/px · 1 of 20 slices shown (18 of 25)]
[im 1/20]
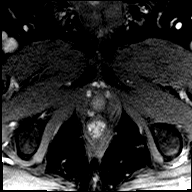

[Series 46: post t1_twist_tra_dyn-copy cent_sub_ttc=(id) · axial · 3.5mm · 0.83mm/px · 1 of 20 slices shown (17 of 23)]
[im 1/20]
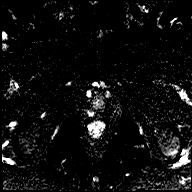

[Series 47: post t1_twist_tra_dyn-copy center · axial · 3.5mm · 0.83mm/px · 1 of 20 slices shown (19 of 25)]
[im 1/20]
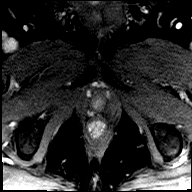

[Series 48: post t1_twist_tra_dyn-copy cent_sub_ttc=(id) · axial · 3.5mm · 0.83mm/px · 1 of 20 slices shown (18 of 23)]
[im 1/20]
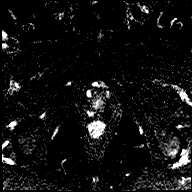

[Series 49: post t1_twist_tra_dyn-copy center · axial · 3.5mm · 0.83mm/px · 1 of 20 slices shown (20 of 25)]
[im 1/20]
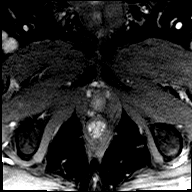

[Series 50: post t1_twist_tra_dyn-copy cent_sub_ttc=(id) · axial · 3.5mm · 0.83mm/px · 1 of 20 slices shown (19 of 23)]
[im 1/20]
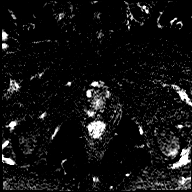

[Series 51: post t1_twist_tra_dyn-copy center · axial · 3.5mm · 0.83mm/px · 1 of 20 slices shown (21 of 25)]
[im 1/20]
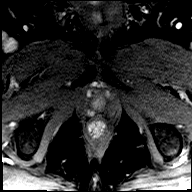

[Series 52: post t1_twist_tra_dyn-copy cent_sub_ttc=(id) · axial · 3.5mm · 0.83mm/px · 1 of 20 slices shown (20 of 23)]
[im 1/20]
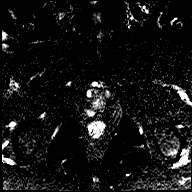

[Series 53: post t1_twist_tra_dyn-copy center · axial · 3.5mm · 0.83mm/px · 1 of 20 slices shown (22 of 25)]
[im 1/20]
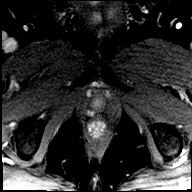

[Series 54: post t1_twist_tra_dyn-copy cent_sub_ttc=(id) · axial · 3.5mm · 0.83mm/px · 1 of 20 slices shown (21 of 23)]
[im 1/20]
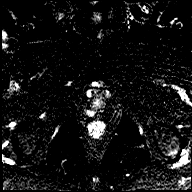

[Series 55: post t1_twist_tra_dyn-copy center · axial · 3.5mm · 0.83mm/px · 1 of 20 slices shown (23 of 25)]
[im 1/20]
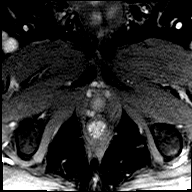

[Series 56: post t1_twist_tra_dyn-copy cent_sub_ttc=(id) · axial · 3.5mm · 0.83mm/px · 1 of 20 slices shown (22 of 23)]
[im 1/20]
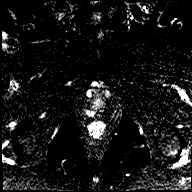

[Series 57: post t1_twist_tra_dyn-copy center · axial · 3.5mm · 0.83mm/px · 1 of 20 slices shown (24 of 25)]
[im 1/20]
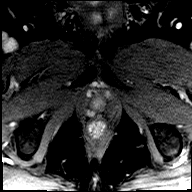

[Series 58: post t1_twist_tra_dyn-copy cent_sub_ttc=(id) · axial · 3.5mm · 0.83mm/px · 1 of 20 slices shown (23 of 23)]
[im 1/20]
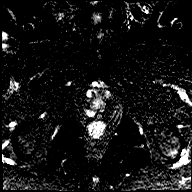

[Series 59: post t1_twist_tra_dyn-copy center · axial · 3.5mm · 0.83mm/px · 1 of 20 slices shown (25 of 25)]
[im 1/20]
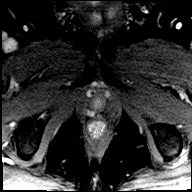

[56 of 56 positions shown; findings below may reference images not displayed]

FINDINGS: Prostate: Signs of marked BPH with marked hypertrophy of the central
portion of the gland, very thin mildly heterogeneous peripheral
zone. Marked median lobe hypertrophy extending into the bladder
base.

No signs of high-risk lesion. Note that on diffusion-weighted
imaging the prostate is incompletely imaged on today's study. Also
on axial T2.

No signs of high-risk lesion on coronal images in the hypertrophied
median lobe.

Volume: 5.5 x 5.0 x 8.0 (volume = 120) cm

Transcapsular spread:  Absent

Seminal vesicle involvement: Absent

Neurovascular bundle involvement: Absent

Pelvic adenopathy: Absent

Bone metastasis: Absent

Other findings: None
IMPRESSION: 1. No high-risk lesion in the prostate. Signs of BPH and
prostatitis.
2. Gland is incompletely imaged on today's study. The patient will
return for completion of multi B value diffusion-weighted imaging to
extend from above the gland to mid gland and high B value diffusion
over the same coverage. Would also acquire axial standard T2 small
field of view over the same coverage. In addition, please repeat T2
isotropic series for uronav over the entire prostate.

ADDENDUM:
Additional sequences were obtained. The show no signs of high-risk
lesion in the hypertrophied median lobe which was incompletely
imaged on the previous examination.

Final impression, no signs of high-risk lesion in the prostate.
Signs of BPH.

*** End of Addendum ***
FINDINGS: Prostate: Signs of marked BPH with marked hypertrophy of the central
portion of the gland, very thin mildly heterogeneous peripheral
zone. Marked median lobe hypertrophy extending into the bladder
base.

No signs of high-risk lesion. Note that on diffusion-weighted
imaging the prostate is incompletely imaged on today's study. Also
on axial T2.

No signs of high-risk lesion on coronal images in the hypertrophied
median lobe.

Volume: 5.5 x 5.0 x 8.0 (volume = 120) cm

Transcapsular spread:  Absent

Seminal vesicle involvement: Absent

Neurovascular bundle involvement: Absent

Pelvic adenopathy: Absent

Bone metastasis: Absent

Other findings: None
IMPRESSION: 1. No high-risk lesion in the prostate. Signs of BPH and
prostatitis.
2. Gland is incompletely imaged on today's study. The patient will
return for completion of multi B value diffusion-weighted imaging to
extend from above the gland to mid gland and high B value diffusion
over the same coverage. Would also acquire axial standard T2 small
field of view over the same coverage. In addition, please repeat T2
isotropic series for uronav over the entire prostate.

## 2020-01-25 MED ORDER — GADOBENATE DIMEGLUMINE 529 MG/ML IV SOLN
16.0000 mL | Freq: Once | INTRAVENOUS | Status: AC | PRN
  Administered 2020-01-25: 14:00:00 16 mL via INTRAVENOUS

## 2020-01-27 ENCOUNTER — Other Ambulatory Visit: Payer: Self-pay | Admitting: Urology

## 2020-01-27 DIAGNOSIS — R972 Elevated prostate specific antigen [PSA]: Secondary | ICD-10-CM

## 2020-01-28 ENCOUNTER — Other Ambulatory Visit: Payer: Self-pay | Admitting: Family Medicine

## 2020-02-02 ENCOUNTER — Other Ambulatory Visit: Payer: Self-pay | Admitting: Family Medicine

## 2020-02-02 NOTE — Telephone Encounter (Signed)
Requested Prescriptions  Pending Prescriptions Disp Refills  . fluocinonide cream (LIDEX) 0.05 % [Pharmacy Med Name: FLUOCINONIDE 0.05% CREAM 30GM] 30 g 1    Sig: APPLY SPARINGLY EXTERNALLY TO THE AFFECTED AREA TWICE DAILY     Off-Protocol Failed - 02/02/2020  3:44 AM      Failed - Medication not assigned to a protocol, review manually.      Passed - Valid encounter within last 12 months    Recent Outpatient Visits          1 month ago Essential hypertension   [facility], Lilia Argue, MD   6 months ago Abdominal pain, left upper quadrant   [facility] at [facility], Rex Kras, MD   7 months ago Annual physical exam   [facility], Lilia Argue, MD   10 months ago Right foot pain   [facility] at [facility], Ines Bloomer, MD   1 year ago Prediabetes   [facility], Lilia Argue, MD      Future Appointments            In 4 months Rutherford Guys, MD Primary [facility] at [place], [facility]

## 2020-02-04 ENCOUNTER — Other Ambulatory Visit: Payer: Self-pay | Admitting: Family Medicine

## 2020-02-05 ENCOUNTER — Ambulatory Visit: Admission: RE | Admit: 2020-02-05 | Payer: BC Managed Care – PPO | Source: Ambulatory Visit

## 2020-02-05 ENCOUNTER — Other Ambulatory Visit: Payer: Self-pay

## 2020-02-16 ENCOUNTER — Other Ambulatory Visit: Payer: Self-pay | Admitting: Family Medicine

## 2020-03-04 ENCOUNTER — Other Ambulatory Visit: Payer: Self-pay | Admitting: Family Medicine

## 2020-03-09 ENCOUNTER — Encounter: Payer: Self-pay | Admitting: Family Medicine

## 2020-03-09 NOTE — Telephone Encounter (Signed)
Pt is wondering if he should get the COVID-19 vaccine considering his history of calcification in his lungs. Please advise

## 2020-03-09 NOTE — Telephone Encounter (Signed)
1. Yes 2. No need for testing unless you have symptoms

## 2020-03-12 ENCOUNTER — Ambulatory Visit: Payer: BC Managed Care – PPO | Attending: Family

## 2020-03-12 DIAGNOSIS — Z23 Encounter for immunization: Secondary | ICD-10-CM

## 2020-03-12 NOTE — Progress Notes (Signed)
   Covid-19 Vaccination Clinic  Name:  Rickey Cruz    MRN: [id number] DOB: 1961/01/14  03/12/2020  Rickey Cruz was observed post Covid-19 immunization for 15 minutes without incident. He was provided with Vaccine Information Sheet and instruction to access the V-Safe system.   Rickey Cruz was instructed to call 911 with any severe reactions post vaccine: Marland Kitchen Difficulty breathing  . Swelling of face and throat  . A fast heartbeat  . A bad rash all over body  . Dizziness and weakness   Immunizations Administered    Name Date Dose VIS Date Route   Moderna COVID-19 Vaccine 03/12/2020 11:28 AM 0.5 mL 11/26/2019 Intramuscular   Manufacturer: Moderna   Lot: [id number]   [id number]

## 2020-03-16 ENCOUNTER — Other Ambulatory Visit: Payer: Self-pay | Admitting: Family Medicine

## 2020-03-31 ENCOUNTER — Other Ambulatory Visit: Payer: Self-pay | Admitting: Family Medicine

## 2020-03-31 NOTE — Telephone Encounter (Signed)
Requested Prescriptions  Pending Prescriptions Disp Refills  . clobetasol (OLUX) 0.05 % topical foam [Pharmacy Med Name: CLOBETASOL 0.05% FOAM 50GM] 50 g 3    Sig: APPLY EXTERNALLY TO THE AFFECTED AREA TWICE DAILY     Dermatology:  Corticosteroids Passed - 03/31/2020  3:44 AM      Passed - Valid encounter within last 12 months    Recent Outpatient Visits          3 months ago Essential hypertension   [facility], Lilia Argue, MD   8 months ago Abdominal pain, left upper quadrant   Primary Care at [facility], Rex Kras, MD   9 months ago Annual physical exam   [facility], Lilia Argue, MD   12 months ago Right foot pain   [facility] Care at [facility], Ines Bloomer, MD   1 year ago Prediabetes   [facility] at [facility], Lilia Argue, MD      Future Appointments            In 2 months Rutherford Guys, MD Primary Care at [place], [facility]

## 2020-04-14 ENCOUNTER — Ambulatory Visit: Payer: BC Managed Care – PPO | Attending: Family

## 2020-04-14 ENCOUNTER — Encounter: Payer: Self-pay | Admitting: Family Medicine

## 2020-04-14 DIAGNOSIS — Z23 Encounter for immunization: Secondary | ICD-10-CM

## 2020-04-14 NOTE — Progress Notes (Signed)
   Covid-19 Vaccination Clinic  Name:  Rickey Cruz    MRN: [id number] DOB: May 03, 1961  04/14/2020  Rickey Cruz was observed post Covid-19 immunization for 15 minutes without incident. He was provided with Vaccine Information Sheet and instruction to access the V-Safe system.   Rickey Cruz was instructed to call 911 with any severe reactions post vaccine: Marland Kitchen Difficulty breathing  . Swelling of face and throat  . A fast heartbeat  . A bad rash all over body  . Dizziness and weakness   Immunizations Administered    Name Date Dose VIS Date Route   Moderna COVID-19 Vaccine 04/14/2020 10:24 AM 0.5 mL 11/2019 Intramuscular   Manufacturer: Moderna   Lot: [id number]   [id number]

## 2020-04-17 ENCOUNTER — Other Ambulatory Visit: Payer: Self-pay | Admitting: Family Medicine

## 2020-04-29 ENCOUNTER — Encounter: Payer: Self-pay | Admitting: Family Medicine

## 2020-04-29 ENCOUNTER — Other Ambulatory Visit: Payer: Self-pay | Admitting: Family Medicine

## 2020-06-03 ENCOUNTER — Encounter: Payer: Self-pay | Admitting: Emergency Medicine

## 2020-06-03 ENCOUNTER — Ambulatory Visit
Admission: EM | Admit: 2020-06-03 | Discharge: 2020-06-03 | Disposition: A | Discharge status: Home / Self Care | Payer: BC Managed Care – PPO | Attending: Emergency Medicine | Admitting: Emergency Medicine

## 2020-06-03 ENCOUNTER — Other Ambulatory Visit: Payer: Self-pay

## 2020-06-03 DIAGNOSIS — M436 Torticollis: Secondary | ICD-10-CM

## 2020-06-03 DIAGNOSIS — M62838 Other muscle spasm: Secondary | ICD-10-CM

## 2020-06-03 MED ORDER — TIZANIDINE HCL 4 MG PO TABS: 4.0000 mg | ORAL_TABLET | Freq: Three times a day (TID) | ORAL | 0 refills | Status: DC | PRN

## 2020-06-03 MED ORDER — ACETAMINOPHEN 500 MG PO TABS
1000.0000 mg | ORAL_TABLET | Freq: Once | ORAL | Status: AC
  Administered 2020-06-03: 1000 mg via ORAL

## 2020-06-03 MED ORDER — KETOROLAC TROMETHAMINE 60 MG/2ML IM SOLN
30.0000 mg | Freq: Once | INTRAMUSCULAR | Status: AC
  Administered 2020-06-03: 30 mg via INTRAMUSCULAR

## 2020-06-03 MED ORDER — IBUPROFEN 600 MG PO TABS: 600.0000 mg | ORAL_TABLET | Freq: Four times a day (QID) | ORAL | 0 refills | Status: DC | PRN

## 2020-06-03 NOTE — ED Provider Notes (Signed)
HPI  SUBJECTIVE:  Rickey Cruz is a 59 y.o. male who presents with left-sided neck pain described as a constant spasm starting this morning.  States that he woke up fine, felt a "pop" and now cannot turn his head to the left.  He denies trauma yo the neck.  No fevers, no sore throat.  He had 2 episodes of intermittent, brief left arm numbness.  No upper extremity tingling.  No arm, grip weakness.  No chest pain, shortness of breath, nausea, diaphoresis.  No exertional component.  No radiation of his pain of his jaw, through to his back.  He tried ibuprofen 400 mg with improvement in symptoms, self massage.  Symptoms are also better with sitting down.  Symptoms are worse with standing up, trying to turn his head to the left.  He denies any other myalgias or arthralgias.  He has a past medical history of psoriatic arthritis and started [place] 2 weeks ago.  Also history of hypertension.  No history of prolonged steroid use, osteoporosis, neck injury, cancer, coronary disease, MI, hypercholesterolemia.  VQM:[id number], Lilia Argue, MD   Past Medical History:  Diagnosis Date   Allergy    Flonase, Patanol, Zyrtec   Arthritis    psoriasis   Asthma    seasonal, with allergies   Borderline diabetes    Cluster headache    Colon polyp 10/27/2011   Depression    denies 11/11/16; denies 11/21/18 "been years"   GERD (gastroesophageal reflux disease)    History of kidney stones    x1   Hyperlipidemia    Hypertension    Low back pain    "in kidney area-was told that it was related to gallstones"   Prediabetes    Psoriasis    [name]   Severe sleep apnea    h/o; had procedure and weight control, does not have to wear CPAP   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 Repeat 5 years.   POLYPECTOMY     PROSTATE BIOPSY  10/2014   will have another in 3 months   SEPTOPLASTY  2014   TONSILLECTOMY  2014   UVULOPALATOPHARYNGOPLASTY  2014    Family History  Problem Relation Age of Onset   Stroke Mother    Asthma Sister    Colon cancer Neg Hx    Esophageal cancer Neg Hx    Stomach cancer Neg Hx    Prostate cancer Neg Hx    Diabetes Neg Hx    CAD Neg Hx    Migraines Neg Hx    Headache Neg Hx     Social History   Tobacco Use   Smoking status: Never Smoker   Smokeless tobacco: Never Used  Substance Use Topics   Alcohol use: Never    Alcohol/week: 0.0 standard drinks   Drug use: Never    No current facility-administered medications for this encounter.  Current Outpatient Medications:    albuterol (PROAIR HFA) 108 (90 Base) MCG/ACT inhaler, Inhale 2 puffs into the lungs every 4 (four) hours as needed for wheezing or shortness of breath., Disp: 1 Inhaler, Rfl: 1   amLODipine (NORVASC) 5 MG tablet, TAKE 1 TABLET(5 MG)  BY MOUTH DAILY, Disp: 90 tablet, Rfl: 1   aspirin EC 325 MG tablet, Take 325 mg by mouth as needed., Disp: , Rfl:    beclomethasone (QVAR) 80 MCG/ACT inhaler, Inhale 2 puffs into the lungs 2 (two) times daily., Disp: 3 Inhaler, Rfl: 3   clobetasol (OLUX) 0.05 % topical foam, APPLY EXTERNALLY TO THE AFFECTED AREA TWICE DAILY, Disp: 50 g, Rfl: 3   clobetasol ointment (TEMOVATE) 0.05 %, APPLY EXTERNALLY TO THE AFFECTED AREA TWICE DAILY, Disp: 30 g, Rfl: 3   Continuous Blood Gluc Sensor ([place]) MISC, 1 each by Does not apply route every 14 (fourteen) days., Disp: 1 each, Rfl: 10   fluocinolone (VANOS) 0.01 % cream, Apply topically daily., Disp: 30 g, Rfl: 1   fluocinonide cream (LIDEX) 0.05 %, APPLY SPARINGLY EXTERNALLY TO THE AFFECTED AREA TWICE DAILY, Disp: 30 g, Rfl:  1   fluticasone (FLONASE) 50 MCG/ACT nasal spray, SHAKE LIQUID AND USE 2 SPRAYS IN EACH NOSTRIL EVERY DAY, Disp: 48 g, Rfl: 3   fluticasone (FLOVENT HFA) 110 MCG/ACT inhaler, Inhale 2 puffs into the lungs 2 (two) times daily., Disp: 3 Inhaler, Rfl: 4   hydrochlorothiazide (HYDRODIURIL) 12.5 MG tablet, TAKE 1 TABLET BY MOUTH DAILY, Disp: 90 tablet, Rfl: 0   hyoscyamine (LEVSIN SL) 0.125 MG SL tablet, Place 1 tablet (0.125 mg total) under the tongue every 8 (eight) hours as needed., Disp: 30 tablet, Rfl: 0   ketoconazole (NIZORAL) 2 % shampoo, APPLY EXTERNALLY 2 TIMES A WEEK., Disp: 120 mL, Rfl: 3   montelukast (SINGULAIR) 10 MG tablet, TAKE 1 TABLET(10 MG) BY MOUTH AT BEDTIME, Disp: 90 tablet, Rfl: 3   Multiple Vitamin (MULTIVITAMIN) capsule, Take 1 capsule by mouth daily. OTC, Disp: , Rfl:    rosuvastatin (CRESTOR) 10 MG tablet, TAKE 1 TABLET BY MOUTH DAILY, Disp: 90 tablet, Rfl: 1   ibuprofen (ADVIL) 600 MG tablet, Take 1 tablet (600 mg total) by mouth every 6 (six) hours as needed., Disp: 30 tablet, Rfl: 0   tiZANidine (ZANAFLEX) 4 MG tablet, Take 1 tablet (4 mg total) by mouth every 8 (eight) hours as needed for muscle spasms., Disp: 30 tablet, Rfl: 0  No Known Allergies   ROS  As noted in HPI.   Physical Exam  BP (!) 143/98 (BP Location: Right Arm)    Pulse 86    Temp 98 F (36.7 C) (Oral)    Resp 18    Wt 80.3 kg    SpO2 98%    BMI 864.20 kg/m   Constitutional: Well developed, well nourished, moderate painful distress Eyes:  EOMI, conjunctiva normal bilaterally HENT: Normocephalic, atraumatic,mucus membranes moist Respiratory: Normal inspiratory effort Cardiovascular: Normal rate GI: nondistended skin: No rash, skin intact Musculoskeletal: Head turned to the right. Patient unable to straighten his head, rotate his head to the left. Pain with flexion extension. Positive left-sided trapezius spasm, tenderness. No C-spine tenderness. No tenderness over the shoulder joint.  Patient is able to move the shoulder. Sensation and motor intact in median/radial/ulnar distribution left arm. RP 2+. Strength 5/5 and equal bilaterally. Neurologic: Alert & oriented x 3, no focal neuro deficits Psychiatric: Speech and behavior appropriate  ED Course   Medications  acetaminophen (TYLENOL) tablet 1,000 mg (1,000 mg Oral Given 06/03/20 1830)  ketorolac (TORADOL) injection 30 mg (30 mg Intramuscular Given 06/03/20 1830)    No orders of the defined types were placed in this encounter.   No results found for this or any previous visit (  from the past 24 hour(s)). No results found.  ED Clinical Impression  1. Torticollis, acute   2. Trapezius muscle spasm      ED Assessment/Plan  Given the absence of trauma, x-ray was deferred.  Patient with a torticollis/trapezius muscle spasm. Doubt cardiac or infectious cause of his symptoms. He is not on any antipsychotic, antiparkinson drugs that would cause an acute torticollis. Spasm not listed as Otezla side effect. Do not have muscle relaxant here. Giving Toradol 30 mg IM, Tylenol 1 g. original plan was to send him home with a single Valium and then Zanaflex, patient declined prescription of Valium.  will have him start Zanaflex. Ibuprofen/Tylenol together 3 or 4 times a day as needed for pain. Warm/cool compresses, whichever feels better, gentle massage. Follow-up with PMD in several days if not getting any better, to the ER if he gets worse.   Discussed  MDM, treatment plan, and plan for follow-up with patient. Discussed sn/sx that should prompt return to the ED. patient agrees with plan.   Meds ordered this encounter  Medications   acetaminophen (TYLENOL) tablet 1,000 mg   ketorolac (TORADOL) injection 30 mg   tiZANidine (ZANAFLEX) 4 MG tablet    Sig: Take 1 tablet (4 mg total) by mouth every 8 (eight) hours as needed for muscle spasms.    Dispense:  30 tablet    Refill:  0   ibuprofen (ADVIL) 600 MG tablet    Sig: Take  1 tablet (600 mg total) by mouth every 6 (six) hours as needed.    Dispense:  30 tablet    Refill:  0    *This clinic note was created using [employer]. Therefore, there may be occasional mistakes despite careful proofreading.   ?    Melynda Ripple, MD 06/04/20 1313

## 2020-06-03 NOTE — Discharge Instructions (Addendum)
You can take 600 mg of ibuprofen combined with 1000 mg of Tylenol 3-4 times a day. Do not need to take the Tylenol/ibuprofen combination for another 6 hours. The Toradol/Tylenol that I gave you here should work. Take a Zanaflex as soon as you get home. Try warm or cool compresses, whichever feels better.

## 2020-06-03 NOTE — ED Triage Notes (Signed)
Patient c/o left side neck pain that started this morning. States he felt a pop in his neck this morning and has been having pain since this morning.

## 2020-06-03 NOTE — ED Triage Notes (Signed)
He reports starting [place] 2 weeks ago.

## 2020-06-05 ENCOUNTER — Encounter: Payer: Self-pay | Admitting: Family Medicine

## 2020-06-05 ENCOUNTER — Other Ambulatory Visit: Payer: Self-pay

## 2020-06-05 ENCOUNTER — Ambulatory Visit: Payer: BC Managed Care – PPO | Admitting: Family Medicine

## 2020-06-05 VITALS — BP 130/88 | HR 67 | Temp 98.2°F | Resp 16 | Ht 66.0 in | Wt 178.8 lb

## 2020-06-05 DIAGNOSIS — R7303 Prediabetes: Secondary | ICD-10-CM

## 2020-06-05 DIAGNOSIS — R972 Elevated prostate specific antigen [PSA]: Secondary | ICD-10-CM

## 2020-06-05 DIAGNOSIS — I10 Essential (primary) hypertension: Secondary | ICD-10-CM | POA: Diagnosis not present

## 2020-06-05 DIAGNOSIS — E7800 Pure hypercholesterolemia, unspecified: Secondary | ICD-10-CM | POA: Diagnosis not present

## 2020-06-05 DIAGNOSIS — Z114 Encounter for screening for human immunodeficiency virus [HIV]: Secondary | ICD-10-CM

## 2020-06-05 DIAGNOSIS — L409 Psoriasis, unspecified: Secondary | ICD-10-CM

## 2020-06-05 LAB — COMPREHENSIVE METABOLIC PANEL
ALT: 28 IU/L (ref 0–44)
AST: 22 IU/L (ref 0–40)
Albumin/Globulin Ratio: 1.9 (ref 1.2–2.2)
Albumin: 4.4 g/dL (ref 3.8–4.9)
Alkaline Phosphatase: 57 IU/L (ref 48–121)
BUN/Creatinine Ratio: 13 (ref 9–20)
BUN: 14 mg/dL (ref 6–24)
Bilirubin Total: 0.7 mg/dL (ref 0.0–1.2)
CO2: 28 mmol/L (ref 20–29)
Calcium: 9.6 mg/dL (ref 8.7–10.2)
Chloride: 103 mmol/L (ref 96–106)
Creatinine, Ser: 1.08 mg/dL (ref 0.76–1.27)
GFR calc Af Amer: 87 mL/min/{1.73_m2} (ref >59)
GFR calc non Af Amer: 75 mL/min/{1.73_m2} (ref >59)
Globulin, Total: 2.3 g/dL (ref 1.5–4.5)
Glucose: 107 mg/dL — ABNORMAL HIGH (ref 65–99)
Potassium: 3.8 mmol/L (ref 3.5–5.2)
Sodium: 143 mmol/L (ref 134–144)
Total Protein: 6.7 g/dL (ref 6.0–8.5)

## 2020-06-05 LAB — HEMOGLOBIN A1C
Est. average glucose Bld gHb Est-mCnc: 117 mg/dL
Hgb A1c MFr Bld: 5.7 % — ABNORMAL HIGH (ref 4.8–5.6)

## 2020-06-05 LAB — LIPID PANEL
Chol/HDL Ratio: 2.6 ratio (ref 0.0–5.0)
Cholesterol, Total: 100 mg/dL (ref 100–199)
HDL: 39 mg/dL — ABNORMAL LOW (ref >39)
LDL Chol Calc (NIH): 39 mg/dL (ref 0–99)
Triglycerides: 121 mg/dL (ref 0–149)
VLDL Cholesterol Cal: 22 mg/dL (ref 5–40)

## 2020-06-05 NOTE — Addendum Note (Signed)
Addended by: Amalia Hailey on: 06/05/2020 11:07 AM   Modules accepted: Orders

## 2020-06-05 NOTE — Progress Notes (Signed)
6/11/202110:34 AM  Rickey Cruz [birth year], 59 y.o., male [id number]  Chief Complaint  Patient presents with  . Hypertension    pt is here for 6 month follow up on his hypertension, pt reports some headaches unsure if this is due to [place] or not, pt denies dizziness   . Spasms    Lt shoulder muscle spasm, pt went to UC on wed. 06/03/2020 they gave him medication but pt reports he has not taken it. is slightly better since Wed.     HPI:   Patient is a 59 y.o. male with past medical history significant for prediabetes, HTN, HLP, elevated PSA, OSA, GERD, asthma, seasonal allergies, psoariasis, cluster HA who presents today for routine followup  Last OV dec 2020  - referred back to urology for increase in PSA  Saw UC on June 03 2020 - torticollis, given toradol 30mg  and APAP 1000mg  at UC, rx zanaflex Neck pain doing much better  Started on otezla, [facility], for his psoriasis about 3-4 weeks ago  Saw urology, did prostate MRI, BPH, benign, no plans for bx, has appt with him in Aug, he denies BPH symptoms, not on meds  He reports that he dose not check blood glucose  He is fasting today  Lab Results  Component Value Date   HGBA1C 5.6 12/06/2019   HGBA1C 5.9 (H) 06/13/2019   HGBA1C 5.9 (H) 11/01/2018   Lab Results  Component Value Date   MICROALBUR 1.7 05/19/2016   LDLCALC 63 12/06/2019   CREATININE 0.92 12/06/2019   Lab Results  Component Value Date   PSA1 6.4 (H) 12/06/2019   PSA1 5.2 (H) 11/01/2018   PSA1 5.5 (H) 04/30/2018   PSA 4.4 (H) 09/21/2016   PSA 5.05 (H) 05/19/2016   PSA 3.85 09/09/2015     Depression screen PHQ 2/9 06/05/2020 12/06/2019 07/24/2019  Decreased Interest 0 0 0  Down, Depressed, Hopeless 0 0 0  PHQ - 2 Score 0 0 0    Fall Risk  06/05/2020 12/06/2019 07/24/2019 06/13/2019 04/02/2019  Falls in the past year? 0 0 - 0 0  Number falls in past yr: - 0 0 0 -  Injury with Fall? - 0 0 0 -  Follow up Falls evaluation completed - - - -     No  Known Allergies  Prior to Admission medications   Medication Sig Start Date End Date Taking? Authorizing Provider  albuterol (PROAIR HFA) 108 (90 Base) MCG/ACT inhaler Inhale 2 puffs into the lungs every 4 (four) hours as needed for wheezing or shortness of breath. 11/25/17  Yes [name], MD  amLODipine (NORVASC) 5 MG tablet TAKE 1 TABLET(5 MG) BY MOUTH DAILY 12/06/19  Yes Rutherford Guys, MD  aspirin EC 325 MG tablet Take 325 mg by mouth as needed.   Yes [provider]  beclomethasone (QVAR) 80 MCG/ACT inhaler Inhale 2 puffs into the lungs 2 (two) times daily. 11/25/17  Yes [name], MD  clobetasol (OLUX) 0.05 % topical foam APPLY EXTERNALLY TO THE AFFECTED AREA TWICE DAILY 03/31/20  Yes Rutherford Guys, MD  clobetasol ointment (TEMOVATE) 0.05 % APPLY EXTERNALLY TO THE AFFECTED AREA TWICE DAILY 04/17/20  Yes Rutherford Guys, MD  fluocinolone (VANOS) 0.01 % cream Apply topically daily. 12/06/19  Yes Rutherford Guys, MD  fluocinonide cream (LIDEX) 0.05 % APPLY SPARINGLY EXTERNALLY TO THE AFFECTED AREA TWICE DAILY 02/02/20  Yes Rutherford Guys, MD  fluticasone [facility] HFA) 110 MCG/ACT inhaler Inhale  2 puffs into the lungs 2 (two) times daily. 04/30/18  Yes [name], MD  hydrochlorothiazide (HYDRODIURIL) 12.5 MG tablet TAKE 1 TABLET BY MOUTH DAILY 04/29/20  Yes Rutherford Guys, MD  hyoscyamine (LEVSIN SL) 0.125 MG SL tablet Place 1 tablet (0.125 mg total) under the tongue every 8 (eight) hours as needed. 08/20/19  Yes Willia Craze, NP  ibuprofen (ADVIL) 600 MG tablet Take 1 tablet (600 mg total) by mouth every 6 (six) hours as needed. 06/03/20  Yes Melynda Ripple, MD  montelukast (SINGULAIR) 10 MG tablet TAKE 1 TABLET(10 MG) BY MOUTH AT BEDTIME 11/25/17  Yes [name], MD  Multiple Vitamin (MULTIVITAMIN) capsule Take 1 capsule by mouth daily. OTC   Yes [provider]  rosuvastatin (CRESTOR) 10 MG tablet TAKE 1 TABLET BY MOUTH DAILY 02/04/20  Yes Rutherford Guys, MD  tiZANidine (ZANAFLEX) 4 MG tablet Take 1 tablet (4 mg total) by mouth every 8 (eight) hours as needed for muscle spasms. 06/03/20  Yes Melynda Ripple, MD  Continuous Blood Gluc Sensor ([place]) MISC 1 each by Does not apply route every 14 (fourteen) days. Patient not taking: Reported on 06/05/2020 11/01/18   Rutherford Guys, MD  fluticasone [facility]) 50 MCG/ACT nasal spray SHAKE LIQUID AND USE 2 SPRAYS IN [facility] NOSTRIL EVERY DAY Patient not taking: Reported on 06/05/2020 11/25/17   [name], MD  ketoconazole (NIZORAL) 2 % shampoo APPLY EXTERNALLY 2 TIMES A WEEK. Patient not taking: Reported on 06/05/2020 11/25/17   [name], MD    Past Medical History:  Diagnosis Date  . Allergy    Flonase, Patanol, Zyrtec  . Arthritis    psoriasis  . Asthma    seasonal, with allergies  . Borderline diabetes   . Cluster headache   . Colon polyp 10/27/2011  . Depression    denies 11/11/16; denies 11/21/18 "been years"  . GERD (gastroesophageal reflux disease)   . History of kidney stones    x1  . Hyperlipidemia   . Hypertension   . Low back pain    "in kidney area-was told that it was related to gallstones"  . Prediabetes   . Psoriasis    [name]  . Severe sleep apnea    h/o; had procedure and weight control, does not have to wear CPAP  . 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 APPENDECTOMY    . CHOLECYSTECTOMY N/A 12/16/2014   Procedure: LAPAROSCOPIC CHOLECYSTECTOMY WITH INTRAOPERATIVE CHOLANGIOGRAM;  Surgeon: [name], MD;  Location: [facility];  Service: General;  Laterality: N/A;  . COLONOSCOPY  10/27/2011   single polyp. Repeat 5 years.  Marland Kitchen POLYPECTOMY    . PROSTATE BIOPSY  10/2014   will have another in 3 months  . SEPTOPLASTY  2014  . TONSILLECTOMY  2014  . UVULOPALATOPHARYNGOPLASTY  2014    Social History   Tobacco Use  . Smoking status:  Never Smoker  . Smokeless tobacco: Never Used  Substance Use Topics  . Alcohol use: Never    Alcohol/week: 0.0 standard drinks    Family History  Problem Relation Age of Onset  . Stroke Mother   . Asthma Sister   . Colon cancer Neg Hx   . Esophageal cancer Neg Hx   . Stomach cancer Neg Hx   . Prostate cancer Neg Hx   . Diabetes Neg Hx   .  CAD Neg Hx   . Migraines Neg Hx   . Headache Neg Hx     Review of Systems  Constitutional: Negative for chills and fever.  Respiratory: Negative for cough and shortness of breath.   Cardiovascular: Negative for chest pain, palpitations and leg swelling.  Gastrointestinal: Negative for abdominal pain, nausea and vomiting.     OBJECTIVE:  Today's Vitals   06/05/20 1013  BP: 130/88  Pulse: 67  Resp: 16  Temp: 98.2 F (36.8 C)  TempSrc: Temporal  SpO2: 98%  Weight: 178 lb 12.8 oz (81.1 kg)  Height: 5\' 6"  (1.676 m)   Body mass index is 28.86 kg/m.   Physical Exam Vitals and nursing note reviewed.  Constitutional:      Appearance: He is well-developed.  HENT:     Head: Normocephalic and atraumatic.  Eyes:     Conjunctiva/sclera: Conjunctivae normal.     Pupils: Pupils are equal, round, and reactive to light.  Cardiovascular:     Rate and Rhythm: Normal rate and regular rhythm.     Heart sounds: No murmur heard.  No friction rub. No gallop.   Pulmonary:     Effort: Pulmonary effort is normal.     Breath sounds: Normal breath sounds. No wheezing or rales.  Musculoskeletal:     Cervical back: Neck supple.  Skin:    General: Skin is warm and dry.  Neurological:     Mental Status: He is alert and oriented to person, place, and time.     No results found for this or any previous visit (from the past 24 hour(s)).  No results found.   ASSESSMENT and PLAN  1. Essential hypertension Controlled. Continue current regime.  - Comprehensive metabolic panel  2. Pure hypercholesterolemia Checking labs today, medications  will be adjusted as needed.  - Lipid panel  3. Prediabetes Checking labs today, diet controlled.  - Hemoglobin A1c  4. Elevated PSA Managed by urology, benign MRI per patient report  5. Psoriasis Managed by derm  Other orders - OTEZLA 30 MG TABS; Take 1 tablet by mouth 2 (two) times daily.  Return in about 6 months (around 12/05/2020).    Rutherford Guys, MD Primary [facility] at [place], [place] [address] Ph.  [phone] Fax [phone]

## 2020-06-05 NOTE — Patient Instructions (Signed)
° ° ° °  If you have lab work done today you will be contacted with your lab results within the next 2 weeks.  If you have not heard from us then please contact us. The fastest way to get your results is to register for My Chart. ° ° °IF you received an x-ray today, you will receive an invoice from [facility]. Please contact [facility] at [phone] with questions or concerns regarding your invoice.  ° °IF you received labwork today, you will receive an invoice from [facility]. Please contact [facility] at [phone] with questions or concerns regarding your invoice.  ° °Our billing staff will not be able to assist you with questions regarding bills from these companies. ° °You will be contacted with the lab results as soon as they are available. The fastest way to get your results is to activate your My Chart account. Instructions are located on the last page of this paperwork. If you have not heard from us regarding the results in 2 weeks, please contact this office. °  ° ° ° °

## 2020-06-06 LAB — HIV ANTIBODY (ROUTINE TESTING W REFLEX): HIV Screen 4th Generation wRfx: NONREACTIVE

## 2020-06-06 LAB — PSA: Prostate Specific Ag, Serum: 8.1 ng/mL — ABNORMAL HIGH (ref 0.0–4.0)

## 2020-07-20 ENCOUNTER — Other Ambulatory Visit: Payer: Self-pay

## 2020-07-20 ENCOUNTER — Ambulatory Visit (HOSPITAL_COMMUNITY)
Admission: EM | Admit: 2020-07-20 | Discharge: 2020-07-20 | Disposition: A | Discharge status: Home / Self Care | Payer: BC Managed Care – PPO | Attending: Physician Assistant | Admitting: Physician Assistant

## 2020-07-20 ENCOUNTER — Encounter (HOSPITAL_COMMUNITY): Payer: Self-pay | Admitting: Emergency Medicine

## 2020-07-20 DIAGNOSIS — M5432 Sciatica, left side: Secondary | ICD-10-CM

## 2020-07-20 MED ORDER — GABAPENTIN 100 MG PO CAPS
100.0000 mg | ORAL_CAPSULE | Freq: Three times a day (TID) | ORAL | 0 refills | Status: DC
Start: 2020-07-20 — End: 2021-01-12

## 2020-07-20 MED ORDER — PREDNISONE 20 MG PO TABS: 40.0000 mg | ORAL_TABLET | Freq: Every day | ORAL | 0 refills | Status: AC

## 2020-07-20 NOTE — ED Provider Notes (Signed)
[facility]    CSN: [id number] Arrival date & time: 07/20/20  7408      History   Chief Complaint Chief Complaint  Patient presents with  . Knee Pain    HPI Rickey Cruz is a 59 y.o. male.   Pt complains of left lower extremity pain that started about one week ago and has gradually worsened.  He describes the pain as "electrical".  He denies injury or trauma.  He has taken ibuprofen with minimal improvement.  He reports the pain started in his ankle and is now radiating to the back of the lower leg and hamstring.  Denies back pain.  Denies lower extremity swelling, warmth.  Denies similar sx in the past.      Past Medical History:  Diagnosis Date  . Allergy    Flonase, Patanol, Zyrtec  . Arthritis    psoriasis  . Asthma    seasonal, with allergies  . Borderline diabetes   . Cluster headache   . Colon polyp 10/27/2011  . Depression    denies 11/11/16; denies 11/21/18 "been years"  . GERD (gastroesophageal reflux disease)   . History of kidney stones    x1  . Hyperlipidemia   . Hypertension   . Low back pain    "in kidney area-was told that it was related to gallstones"  . Prediabetes   . Psoriasis    [name]  . Severe sleep apnea    h/o; had procedure and weight control, does not have to wear CPAP  . 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 Abdominal pain, left upper quadrant 07/24/2019  . Right foot pain 04/02/2019  . Prediabetes 04/02/2019  . Acute gouty arthritis 04/02/2019  . Cluster headache 11/21/2018  . Glucose intolerance (impaired glucose tolerance) 12/23/2014  . Hyperlipidemia 12/23/2014  . Psoriasis 06/27/2014  . Multiple lung nodules 12/09/2011  . PULMONARY HYPERTENSION 03/04/2010  . Essential hypertension 02/10/2010  . Asthma in adult 02/10/2010  . OBSTRUCTIVE SLEEP APNEA 02/04/2010  . TUBERCULOSIS, HX OF 02/04/2010    Past Surgical  History:  Procedure Laterality Date  . APPENDECTOMY    . CHOLECYSTECTOMY N/A 12/16/2014   Procedure: LAPAROSCOPIC CHOLECYSTECTOMY WITH INTRAOPERATIVE CHOLANGIOGRAM;  Surgeon: [name], MD;  Location: [facility];  Service: General;  Laterality: N/A;  . COLONOSCOPY  10/27/2011   single polyp. Repeat 5 years.  Marland Kitchen POLYPECTOMY    . PROSTATE BIOPSY  10/2014   will have another in 3 months  . SEPTOPLASTY  2014  . TONSILLECTOMY  2014  . UVULOPALATOPHARYNGOPLASTY  2014       Home Medications    Prior to Admission medications   Medication Sig Start Date End Date Taking? Authorizing Provider  albuterol (PROAIR HFA) 108 (90 Base) MCG/ACT inhaler Inhale 2 puffs into the lungs every 4 (four) hours as needed for wheezing or shortness of breath. 11/25/17  Yes [name], MD  amLODipine (NORVASC) 5 MG tablet TAKE 1 TABLET(5 MG) BY MOUTH DAILY 12/06/19  Yes Rutherford Guys, MD  aspirin EC 325 MG tablet Take 325 mg by mouth as needed.   Yes [provider]  beclomethasone (QVAR) 80 MCG/ACT inhaler Inhale 2 puffs into the lungs 2 (two) times daily. 11/25/17  Yes [name], MD  clobetasol (OLUX) 0.05 % topical foam APPLY EXTERNALLY TO THE AFFECTED AREA TWICE DAILY 03/31/20  Yes Grant Fontana M,  MD  clobetasol ointment (TEMOVATE) 0.05 % APPLY EXTERNALLY TO THE AFFECTED AREA TWICE DAILY 04/17/20  Yes Rutherford Guys, MD  fluocinolone (VANOS) 0.01 % cream Apply topically daily. 12/06/19  Yes Rutherford Guys, MD  fluocinonide cream (LIDEX) 0.05 % APPLY SPARINGLY EXTERNALLY TO THE AFFECTED AREA TWICE DAILY 02/02/20  Yes Rutherford Guys, MD  fluticasone [facility]) 50 MCG/ACT nasal spray SHAKE LIQUID AND USE 2 SPRAYS IN EACH NOSTRIL EVERY DAY 11/25/17  Yes [name], MD  fluticasone (FLOVENT HFA) 110 MCG/ACT inhaler Inhale 2 puffs into the lungs 2 (two) times daily. 04/30/18  Yes [name], MD  hydrochlorothiazide (HYDRODIURIL) 12.5 MG tablet TAKE 1 TABLET BY MOUTH DAILY 04/29/20  Yes  Rutherford Guys, MD  hyoscyamine (LEVSIN SL) 0.125 MG SL tablet Place 1 tablet (0.125 mg total) under the tongue every 8 (eight) hours as needed. 08/20/19  Yes Willia Craze, NP  ibuprofen (ADVIL) 600 MG tablet Take 1 tablet (600 mg total) by mouth every 6 (six) hours as needed. 06/03/20  Yes Melynda Ripple, MD  ketoconazole (NIZORAL) 2 % shampoo APPLY EXTERNALLY 2 TIMES A WEEK. 11/25/17  Yes [name], MD  montelukast (SINGULAIR) 10 MG tablet TAKE 1 TABLET(10 MG) BY MOUTH AT BEDTIME 11/25/17  Yes [name], MD  Multiple Vitamin (MULTIVITAMIN) capsule Take 1 capsule by mouth daily. OTC   Yes [provider]  OTEZLA 30 MG TABS Take 1 tablet by mouth 2 (two) times daily. 05/18/20  Yes [provider]  rosuvastatin (CRESTOR) 10 MG tablet TAKE 1 TABLET BY MOUTH DAILY 02/04/20  Yes Rutherford Guys, MD  tiZANidine (ZANAFLEX) 4 MG tablet Take 1 tablet (4 mg total) by mouth every 8 (eight) hours as needed for muscle spasms. 06/03/20  Yes Melynda Ripple, MD  gabapentin (NEURONTIN) 100 MG capsule Take 1 capsule (100 mg total) by mouth 3 (three) times daily for 7 days. 07/20/20 07/27/20  Konrad Felix, PA-C  predniSONE (DELTASONE) 20 MG tablet Take 2 tablets (40 mg total) by mouth daily with breakfast for 5 days. 07/20/20 07/25/20  Konrad Felix, PA-C    Family History Family History  Problem Relation Age of Onset  . Stroke Mother   . Asthma Sister   . Colon cancer Neg Hx   . Esophageal cancer Neg Hx   . Stomach cancer Neg Hx   . Prostate cancer Neg Hx   . Diabetes Neg Hx   . CAD Neg Hx   . Migraines Neg Hx   . Headache Neg Hx     Social History Social History   Tobacco Use  . Smoking status: Never Smoker  . Smokeless tobacco: Never Used  Vaping Use  . Vaping Use: Never used  Substance Use Topics  . Alcohol use: Never    Alcohol/week: 0.0 standard drinks  . Drug use: Never     Allergies   Patient has no known allergies.   Review of Systems Review  of Systems  Constitutional: Negative for chills and fever.  HENT: Negative for ear pain and sore throat.   Eyes: Negative for pain and visual disturbance.  Respiratory: Negative for cough and shortness of breath.   Cardiovascular: Negative for chest pain and palpitations.  Gastrointestinal: Negative for abdominal pain and vomiting.  Genitourinary: Negative for dysuria and hematuria.  Musculoskeletal: Positive for myalgias (left lower extremity). Negative for arthralgias, back pain, gait problem and joint swelling.  Skin: Negative for color change, rash and wound.  Neurological:  Negative for seizures and syncope.  All other systems reviewed and are negative.    Physical Exam Triage Vital Signs ED Triage Vitals  Enc Vitals Group     BP --      Pulse --      Resp --      Temp --      Temp src --      SpO2 --      Weight 07/20/20 2105 173 lb (78.5 kg)     Height 07/20/20 2105 5\' 6"  (1.676 m)     Head Circumference --      Peak Flow --      Pain Score 07/20/20 2032 5     Pain Loc --      Pain Edu? --      Excl. in [place]? --    No data found.  Updated Vital Signs Ht 5\' 6"  (1.676 m)   Wt 173 lb (78.5 kg)   BMI 27.92 kg/m   Visual Acuity Right Eye Distance:   Left Eye Distance:   Bilateral Distance:    Right Eye Near:   Left Eye Near:    Bilateral Near:     Physical Exam Vitals and nursing note reviewed.  Constitutional:      Appearance: He is well-developed.  HENT:     Head: Normocephalic and atraumatic.  Eyes:     Conjunctiva/sclera: Conjunctivae normal.  Cardiovascular:     Rate and Rhythm: Normal rate and regular rhythm.     Heart sounds: No murmur heard.   Pulmonary:     Effort: Pulmonary effort is normal. No respiratory distress.     Breath sounds: Normal breath sounds.  Abdominal:     Palpations: Abdomen is soft.     Tenderness: There is no abdominal tenderness.  Musculoskeletal:     Cervical back: Neck supple.     Comments: Positive left SLR.  No  calf swelling, warmth, or redness. Negative homans sign.  No bony tenderness to ankle, lower leg, or knee.    Skin:    General: Skin is warm and dry.  Neurological:     Mental Status: He is alert.      UC Treatments / Results  Labs (all labs ordered are listed, but only abnormal results are displayed) Labs Reviewed - No data to display  EKG   Radiology No results found.  Procedures Procedures (including critical care time)  Medications Ordered in UC Medications - No data to display  Initial Impression / Assessment and Plan / UC Course  I have reviewed the triage vital signs and the nursing notes.  Pertinent labs & imaging results that were available during my care of the patient were reviewed by me and considered in my medical decision making (see chart for details).     Pt with lower leg pain which progressed from the ankle up to the hamstring without injury or trauma. Positive SLR, severe pain with SLR, consistent with sciatica.  No signs of DVT.  Will start patient on gabapentin and prednisone.  If no improvement he will follow up with PCP.   Final Clinical Impressions(s) / UC Diagnoses   Final diagnoses:  Sciatica of left side     Discharge Instructions     Can take Tylenol as needed.  If no improvement recommend follow up with Primary Care.    ED Prescriptions    Medication Sig Dispense Auth. Provider   gabapentin (NEURONTIN) 100 MG capsule Take 1 capsule (100 mg  total) by mouth 3 (three) times daily for 7 days. 21 capsule Hasini Peachey, PA-C   predniSONE (DELTASONE) 20 MG tablet Take 2 tablets (40 mg total) by mouth daily with breakfast for 5 days. 10 tablet Konrad Felix, PA-C     PDMP not reviewed this encounter.   Konrad Felix, PA-C 07/20/20 2147

## 2020-07-20 NOTE — Discharge Instructions (Signed)
Can take Tylenol as needed.  If no improvement recommend follow up with Primary Care.

## 2020-07-20 NOTE — ED Triage Notes (Signed)
Patient presents to urgent care today with symptoms of left leg pain. Pt states pain started near his foot and he felt like a small ball under his skin and behind his knee. Symptoms began on about a week ago. They have tried ice with some relief of symptoms.

## 2020-07-22 ENCOUNTER — Ambulatory Visit: Payer: BC Managed Care – PPO | Admitting: Family Medicine

## 2020-07-25 ENCOUNTER — Other Ambulatory Visit: Payer: Self-pay | Admitting: Family Medicine

## 2020-07-28 ENCOUNTER — Other Ambulatory Visit: Payer: Self-pay | Admitting: Family Medicine

## 2020-08-27 NOTE — Progress Notes (Signed)
@Patient  ID: Rickey Cruz, male    DOB: March 03, 1961, 59 y.o.   MRN: [id number]  Chief Complaint  Patient presents with   Follow-up    Pt states he has been okay since last visit. Pt does have a tickle in throat which believes is due to allergies.    Referring provider: Rutherford Guys, MD  HPI:  59 year old male never smoker followed in our office for asthma and allergic rhinitis  PMH: Previous history of obstructive sleep apnea.  No longer managed on CPAP after surgery, history of pulmonary nodules, hyperlipidemia, hypertension Smoker/ Smoking History: Never smoker Maintenance: Qvar Pt of: Dr. Elsworth Soho  08/28/2020  - Visit   59 year old male never smoker followed in our office for asthma and obstructive sleep apnea.  Patient completing a 41-month follow-up with our office.  Patient's been managed well since last being seen.  He has not needed additional antibiotics or steroids.  Last chest x-ray in August/2020 was stable.  Did not show any new pulmonary nodules.  Patient reports adherence to Qvar.  He has not had to use his rescue inhaler.  He would like refills of Qvar and Singulair today.  He has some occasional nasal drainage.  He typically has flares of his allergic rhinitis with seasonal changes especially in fall.  He has received his COVID-19 vaccinations.  He is interested in the seasonal flu vaccine today he would like to discuss this.   Tests:   08/23/2019-chest x-ray-no edema or consolidation, no pulmonary nodule lesions evident by radiography, small nodular lesion seen on prior CT of the chest from 2013 are not seen on radiography  08/31/2020 no significant obstructive sleep apnea doing in the study AHI 0.3  FENO:  No results found for: NITRICOXIDE  PFT: No flowsheet data found.  WALK:  No flowsheet data found.  Imaging: No results found.  Lab Results:  CBC    Component Value Date/Time   WBC 4.5 12/06/2019 1506   WBC 4.1 09/21/2016 1119   RBC 5.32 12/06/2019  1506   RBC 5.60 09/21/2016 1119   HGB 16.6 12/06/2019 1506   HCT 47.9 12/06/2019 1506   PLT 249 12/06/2019 1506   MCV 90 12/06/2019 1506   MCH 31.2 12/06/2019 1506   MCH 30.2 09/21/2016 1119   MCHC 34.7 12/06/2019 1506   MCHC 34.4 09/21/2016 1119   RDW 13.2 12/06/2019 1506   LYMPHSABS 0.7 09/27/2018 1455   MONOABS 495 05/19/2016 0907   EOSABS 0.0 09/27/2018 1455   BASOSABS 0.0 09/27/2018 1455    BMET    Component Value Date/Time   NA 143 06/05/2020 1203   K 3.8 06/05/2020 1203   CL 103 06/05/2020 1203   CO2 28 06/05/2020 1203   GLUCOSE 107 (H) 06/05/2020 1203   GLUCOSE 112 (H) 08/20/2019 1210   BUN 14 06/05/2020 1203   CREATININE 1.08 06/05/2020 1203   CREATININE 1.04 09/21/2016 1119   CALCIUM 9.6 06/05/2020 1203   GFRNONAA 75 06/05/2020 1203   GFRNONAA 81 09/09/2015 0912   GFRAA 87 06/05/2020 1203   GFRAA >89 09/09/2015 0912    BNP No results found for: BNP  ProBNP    Component Value Date/Time   PROBNP 8.8 06/28/2014 0356    Specialty Problems      Pulmonary Problems   OBSTRUCTIVE SLEEP APNEA    CPAP 8/ [facility]       Asthma in adult    Qualifier: Diagnosis of  By: Annamaria Boots MD, Kasandra Knudsen  Multiple lung nodules    CT-calcified and noncalcified nodules, for one year followup.         No Known Allergies  Immunization History  Administered Date(s) Administered   Hepatitis A, Adult 06/27/2014, 05/19/2016   Hepatitis B, adult 06/27/2014, 09/07/2014, 12/28/2014   Influenza,inj,Quad PF,6+ Mos 11/25/2017, 11/01/2018   Meningococcal Conjugate 06/27/2014   Moderna SARS-COVID-2 Vaccination 03/12/2020, 04/14/2020   PPD Test 04/13/2009   Pneumococcal Polysaccharide-23 11/25/2017   Tdap 06/27/2014   Would recommend seasonal flu vaccine, patient will need Pneumovax 23 again in 2023  Patient agrees to receive the seasonal flu vaccine today  Past Medical History:  Diagnosis Date   Allergy    Flonase, Patanol, Zyrtec   Arthritis     psoriasis   Asthma    seasonal, with allergies   Borderline diabetes    Cluster headache    Colon polyp 10/27/2011   Depression    denies 11/11/16; denies 11/21/18 "been years"   GERD (gastroesophageal reflux disease)    History of kidney stones    x1   Hyperlipidemia    Hypertension    Low back pain    "in kidney area-was told that it was related to gallstones"   Prediabetes    Psoriasis    [name]   Severe sleep apnea    h/o; had procedure and weight control, does not have to wear CPAP   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   amLODipine (NORVASC) 5 MG tablet TAKE 1 TABLET(5 MG) BY MOUTH DAILY   aspirin EC 325 MG tablet Take 325 mg by mouth as needed.   beclomethasone (QVAR) 80 MCG/ACT inhaler Inhale 2 puffs into the lungs 2 (two) times daily.   clobetasol (OLUX) 0.05 % topical foam APPLY EXTERNALLY TO THE AFFECTED AREA TWICE DAILY   clobetasol ointment (TEMOVATE) 0.05 % APPLY EXTERNALLY TO THE AFFECTED AREA TWICE DAILY   fluocinolone (VANOS) 0.01 % cream Apply topically daily.   fluocinonide cream (LIDEX) 0.05 % APPLY SPARINGLY EXTERNALLY TO THE AFFECTED AREA TWICE DAILY   fluticasone (FLONASE) 50 MCG/ACT nasal spray SHAKE LIQUID AND USE 2 SPRAYS IN EACH NOSTRIL EVERY DAY   fluticasone (FLOVENT HFA) 110 MCG/ACT inhaler Inhale 2 puffs into the lungs 2 (two) times daily.   gabapentin (NEURONTIN) 100 MG capsule Take 100 mg by mouth 3 (three) times daily.   hydrochlorothiazide (HYDRODIURIL) 12.5 MG tablet TAKE 1 TABLET BY MOUTH DAILY    hyoscyamine (LEVSIN SL) 0.125 MG SL tablet Place 1 tablet (0.125 mg total) under the tongue every 8 (eight) hours as needed.   ibuprofen (ADVIL) 600 MG tablet Take 1 tablet (600 mg total) by mouth every 6 (six) hours as needed.   ketoconazole (NIZORAL) 2 % shampoo APPLY EXTERNALLY 2 TIMES A WEEK.   montelukast (SINGULAIR) 10 MG tablet TAKE 1 TABLET(10 MG) BY MOUTH AT BEDTIME   Multiple Vitamin (MULTIVITAMIN) capsule Take 1 capsule by mouth daily. OTC   OTEZLA 30 MG TABS Take 1 tablet by mouth 2 (two) times daily.   rosuvastatin (CRESTOR) 10 MG tablet  TAKE 1 TABLET BY MOUTH DAILY   tiZANidine (ZANAFLEX) 4 MG tablet Take 1 tablet (4 mg total) by mouth every 8 (eight) hours as needed for muscle spasms.   [DISCONTINUED] beclomethasone (QVAR) 80 MCG/ACT inhaler Inhale 2 puffs into the lungs 2 (two) times daily.   [DISCONTINUED] montelukast (SINGULAIR) 10 MG tablet TAKE 1 TABLET(10 MG) BY MOUTH AT BEDTIME   gabapentin (NEURONTIN) 100 MG capsule Take 1 capsule (100 mg total) by mouth 3 (three) times daily for 7 days.   No facility-administered encounter medications on file as of 08/28/2020.     Review of Systems  Review of Systems  Constitutional: Negative for activity change, chills, fatigue, fever and unexpected weight change.  HENT: Positive for congestion, postnasal drip and rhinorrhea. Negative for sinus pressure, sinus pain and sore throat.   Eyes: Negative.   Respiratory: Positive for cough. Negative for shortness of breath and wheezing.   Cardiovascular: Negative for chest pain and palpitations.  Gastrointestinal: Negative for constipation, diarrhea, nausea and vomiting.  Endocrine: Negative.   Genitourinary: Negative.   Musculoskeletal: Negative.   Skin: Negative.   Neurological: Negative for dizziness and headaches.  Psychiatric/Behavioral: Negative.  Negative for dysphoric mood. The patient is not nervous/anxious.   All other systems reviewed and are negative.     Physical Exam  BP 120/78 (BP Location: Left Arm, Cuff Size: Normal)    Pulse (!) 56    Temp 97.7 F (36.5 C) (Other (Comment)) Comment (Src): wrist   Ht 5\' 6"  (1.676 m)    Wt 178 lb 3.2 oz (80.8 kg)    SpO2 98%    BMI 28.76 kg/m   Wt Readings from Last 5 Encounters:  08/28/20 178 lb 3.2 oz (80.8 kg)  07/20/20 173 lb (78.5 kg)  06/05/20 178 lb 12.8 oz (81.1 kg)  06/03/20 177 lb (80.3 kg)  12/06/19 178 lb (80.7 kg)    BMI Readings from Last 5 Encounters:  08/28/20 28.76 kg/m  07/20/20 27.92 kg/m  06/05/20 28.86 kg/m  06/03/20 864.20 kg/m  12/06/19 869.08 kg/m     Physical Exam Vitals and nursing note reviewed.  Constitutional:      General: He is not in acute distress.    Appearance: Normal appearance. He is normal weight.  HENT:     Head: Normocephalic and atraumatic.     Right Ear: Hearing, tympanic membrane, ear canal and external ear normal. There is no impacted cerumen.     Left Ear: Hearing, tympanic membrane, ear canal and external ear normal. There is no impacted cerumen.     Nose: Rhinorrhea present. No mucosal edema.     Right Turbinates: Not enlarged.     Left Turbinates: Not enlarged.     Mouth/Throat:     Mouth: Mucous membranes are dry.     Pharynx: Oropharynx is clear. No oropharyngeal exudate.     Comments: Postnasal drip Eyes:     Pupils: Pupils are equal, round, and reactive to light.  Cardiovascular:     Rate and Rhythm: Normal rate and regular rhythm.     Pulses: Normal pulses.     Heart sounds: Normal heart sounds. No murmur heard.   Pulmonary:     Effort: Pulmonary effort is normal.     Breath sounds: Normal breath sounds. No decreased breath sounds, wheezing or rales.  Musculoskeletal:     Cervical back: Normal range of motion.     Right lower leg: No edema.     Left lower leg: No  edema.  Lymphadenopathy:     Cervical: No cervical adenopathy.  Skin:    General: Skin is warm and dry.     Capillary Refill: Capillary refill takes  less than 2 seconds.     Findings: No erythema or rash.  Neurological:     General: No focal deficit present.     Mental Status: He is alert and oriented to person, place, and time.     Motor: No weakness.     Coordination: Coordination normal.     Gait: Gait is intact. Gait normal.  Psychiatric:        Mood and Affect: Mood normal.        Behavior: Behavior normal. Behavior is cooperative.        Thought Content: Thought content normal.        Judgment: Judgment normal.       Assessment & Plan:   OBSTRUCTIVE SLEEP APNEA Repeat sleep study did not show obstructive sleep apnea  Plan: Continue to clinically monitor  Asthma in adult Plan: Seasonal flu vaccine today Continue Qvar Continue Singulair Continue daily allergy pill Can start nasal saline rinses if allergic rhinitis symptoms worsen Can start fluticasone/Flonase nasal spray 1 spray each nostril prior to nasal saline rinses  Healthcare maintenance Plan: Seasonal flu vaccine today  Multiple lung nodules Chest x-ray in August/2020 stable with no new pulmonary nodules Never smoker  Plan: We will continue clinically monitor    Return in about 1 year (around 08/28/2021), or if symptoms worsen or fail to improve, for Follow up with Dr. Elsworth Soho.   Lauraine Rinne, NP 08/28/2020   This appointment required 26 minutes of patient care (this includes precharting, chart review, review of results, face-to-face care, etc.).

## 2020-08-28 ENCOUNTER — Encounter: Payer: Self-pay | Admitting: Pulmonary Disease

## 2020-08-28 ENCOUNTER — Ambulatory Visit: Payer: BC Managed Care – PPO | Admitting: Pulmonary Disease

## 2020-08-28 ENCOUNTER — Other Ambulatory Visit: Payer: Self-pay

## 2020-08-28 VITALS — BP 120/78 | HR 56 | Temp 97.7°F | Ht 66.0 in | Wt 178.2 lb

## 2020-08-28 DIAGNOSIS — J4540 Moderate persistent asthma, uncomplicated: Secondary | ICD-10-CM | POA: Diagnosis not present

## 2020-08-28 DIAGNOSIS — Z0000 Encounter for general adult medical examination without abnormal findings: Secondary | ICD-10-CM | POA: Insufficient documentation

## 2020-08-28 DIAGNOSIS — Z23 Encounter for immunization: Secondary | ICD-10-CM

## 2020-08-28 DIAGNOSIS — J301 Allergic rhinitis due to pollen: Secondary | ICD-10-CM

## 2020-08-28 DIAGNOSIS — G4733 Obstructive sleep apnea (adult) (pediatric): Secondary | ICD-10-CM

## 2020-08-28 DIAGNOSIS — R918 Other nonspecific abnormal finding of lung field: Secondary | ICD-10-CM

## 2020-08-28 MED ORDER — QVAR 80 MCG/ACT IN AERS: 2.0000 | INHALATION_SPRAY | Freq: Two times a day (BID) | RESPIRATORY_TRACT | 3 refills | Status: DC

## 2020-08-28 MED ORDER — MONTELUKAST SODIUM 10 MG PO TABS: ORAL_TABLET | ORAL | 3 refills | Status: DC

## 2020-08-28 NOTE — Patient Instructions (Addendum)
You were seen today by Lauraine Rinne, NP  for:   1. Moderate persistent asthma in adult without complication  Continue Qvar  Continue Singulair  Continue daily allergy pill  If you start to have more persistent nasal drainage, cough, allergy symptoms you can consider starting nasal saline rinses:  Start nasal saline rinses twice daily Use distilled water Shake well Get bottle lukewarm like a baby bottle  Continue fluticasone/Flonase nasal spray 1 spray each nostril daily when you are having allergy symptoms  2. Healthcare maintenance  Seasonal flu vaccine today  Follow Up:    Return in about 1 year (around 08/28/2021), or if symptoms worsen or fail to improve, for Follow up with Dr. Elsworth Soho.   Notification of test results are managed in the following manner: If there are  any recommendations or changes to the  plan of care discussed in office today,  we will contact you and let you know what they are. If you do not hear from [place], then your results are normal and you can view them through your  MyChart account , or a letter will be sent to you. Thank you again for trusting [place] with your care  - Thank you, [facility]    It is flu season:   >>> Best ways to protect herself from the flu: Receive the yearly flu vaccine, practice good hand hygiene washing with soap and also using hand sanitizer when available, eat a nutritious meals, get adequate rest, hydrate appropriately       Please contact the office if your symptoms worsen or you have concerns that you are not improving.   Thank you for choosing [facility] for your healthcare, and for allowing [place] to partner with you on your healthcare journey. I am thankful to be able to provide care to you today.   Wyn Quaker FNP-C   Influenza Virus Vaccine injection What is this medicine? INFLUENZA VIRUS VACCINE (in floo EN zuh VAHY ruhs vak SEEN) helps to reduce the risk of getting influenza also known as the flu. The  vaccine only helps protect you against some strains of the flu. This medicine may be used for other purposes; ask your health care provider or pharmacist if you have questions. COMMON BRAND NAME(S): Afluria, Afluria Quadrivalent, Agriflu, Alfuria, FLUAD, Fluarix, Fluarix Quadrivalent, Flublok, Flublok Quadrivalent, FLUCELVAX, FLUCELVAX Quadrivalent, Flulaval, Flulaval Quadrivalent, Fluvirin, Fluzone, Fluzone High-Dose, Fluzone Intradermal, Fluzone Quadrivalent What should I tell my health care provider before I take this medicine? They need to know if you have any of these conditions:  bleeding disorder like hemophilia  fever or infection  Guillain-Barre syndrome or other neurological problems  immune system problems  infection with the human immunodeficiency virus (HIV) or AIDS  low blood platelet counts  multiple sclerosis  an unusual or allergic reaction to influenza virus vaccine, latex, other medicines, foods, dyes, or preservatives. Different brands of vaccines contain different allergens. Some may contain latex or eggs. Talk to your doctor about your allergies to make sure that you get the right vaccine.  pregnant or trying to get pregnant  breast-feeding How should I use this medicine? This vaccine is for injection into a muscle or under the skin. It is given by a health care professional. A copy of Vaccine Information Statements will be given before each vaccination. Read this sheet carefully each time. The sheet may change frequently. Talk to your healthcare provider to see which vaccines are right for you. Some vaccines should not be  used in all age groups. Overdosage: If you think you have taken too much of this medicine contact a poison control center or emergency room at once. NOTE: This medicine is only for you. Do not share this medicine with others. What if I miss a dose? This does not apply. What may interact with this medicine?  chemotherapy or radiation  therapy  medicines that lower your immune system like etanercept, anakinra, infliximab, and adalimumab  medicines that treat or prevent blood clots like warfarin  phenytoin  steroid medicines like prednisone or cortisone  theophylline  vaccines This list may not describe all possible interactions. Give your health care provider a list of all the medicines, herbs, non-prescription drugs, or dietary supplements you use. Also tell them if you smoke, drink alcohol, or use illegal drugs. Some items may interact with your medicine. What should I watch for while using this medicine? Report any side effects that do not go away within 3 days to your doctor or health care professional. Call your health care provider if any unusual symptoms occur within 6 weeks of receiving this vaccine. You may still catch the flu, but the illness is not usually as bad. You cannot get the flu from the vaccine. The vaccine will not protect against colds or other illnesses that may cause fever. The vaccine is needed every year. What side effects may I notice from receiving this medicine? Side effects that you should report to your doctor or health care professional as soon as possible:  allergic reactions like skin rash, itching or hives, swelling of the face, lips, or tongue Side effects that usually do not require medical attention (report to your doctor or health care professional if they continue or are bothersome):  fever  headache  muscle aches and pains  pain, tenderness, redness, or swelling at the injection site  tiredness This list may not describe all possible side effects. Call your doctor for medical advice about side effects. You may report side effects to FDA at 1-800-FDA-1088. Where should I keep my medicine? The vaccine will be given by a health care professional in a clinic, pharmacy, doctor's office, or other health care setting. You will not be given vaccine doses to store at home. NOTE:  This sheet is a summary. It may not cover all possible information. If you have questions about this medicine, talk to your doctor, pharmacist, or health care provider.  2020 Elsevier/Gold Standard (2018-11-06 08:45:43)

## 2020-08-28 NOTE — Assessment & Plan Note (Signed)
Chest x-ray in August/2020 stable with no new pulmonary nodules Never smoker  Plan: We will continue clinically monitor

## 2020-08-28 NOTE — Assessment & Plan Note (Signed)
Plan: Seasonal flu vaccine today Continue Qvar Continue Singulair Continue daily allergy pill Can start nasal saline rinses if allergic rhinitis symptoms worsen Can start fluticasone/Flonase nasal spray 1 spray each nostril prior to nasal saline rinses

## 2020-08-28 NOTE — Assessment & Plan Note (Signed)
Repeat sleep study did not show obstructive sleep apnea  Plan: Continue to clinically monitor

## 2020-08-28 NOTE — Assessment & Plan Note (Signed)
Plan: Seasonal flu vaccine today 

## 2020-08-29 ENCOUNTER — Other Ambulatory Visit: Payer: Self-pay | Admitting: Family Medicine

## 2020-08-29 NOTE — Telephone Encounter (Signed)
Requested Prescriptions  Pending Prescriptions Disp Refills   amLODipine (NORVASC) 5 MG tablet [Pharmacy Med Name: AMLODIPINE BESYLATE 5MG  TABLETS] 90 tablet 0    Sig: TAKE 1 TABLET(5 MG) BY MOUTH DAILY     Cardiovascular:  Calcium Channel Blockers Passed - 08/29/2020 11:52 AM      Passed - Last BP in normal range    BP Readings from Last 1 Encounters:  08/28/20 120/78         Passed - Valid encounter within last 6 months    Recent Outpatient Visits          2 months ago Essential hypertension   [facility], Lilia Argue, MD   8 months ago Essential hypertension   [facility], Lilia Argue, MD   1 year ago Abdominal pain, left upper quadrant   [facility] at [facility], Rex Kras, MD   1 year ago Annual physical exam   [facility], Lilia Argue, MD   1 year ago Right foot pain   [facility] at [facility], Ines Bloomer, MD      Future Appointments            In 3 months Rutherford Guys, MD [facility] [place]

## 2020-11-12 ENCOUNTER — Encounter: Payer: Self-pay | Admitting: Family Medicine

## 2020-11-12 ENCOUNTER — Other Ambulatory Visit: Payer: Self-pay

## 2020-11-12 ENCOUNTER — Ambulatory Visit: Payer: BC Managed Care – PPO | Admitting: Family Medicine

## 2020-11-12 VITALS — BP 114/77 | HR 88 | Temp 98.4°F | Resp 16 | Ht 66.0 in | Wt 172.4 lb

## 2020-11-12 DIAGNOSIS — Z114 Encounter for screening for human immunodeficiency virus [HIV]: Secondary | ICD-10-CM | POA: Diagnosis not present

## 2020-11-12 DIAGNOSIS — T17310A Gastric contents in larynx causing asphyxiation, initial encounter: Secondary | ICD-10-CM

## 2020-11-12 DIAGNOSIS — R7302 Impaired glucose tolerance (oral): Secondary | ICD-10-CM

## 2020-11-12 DIAGNOSIS — R7303 Prediabetes: Secondary | ICD-10-CM | POA: Diagnosis not present

## 2020-11-12 DIAGNOSIS — L659 Nonscarring hair loss, unspecified: Secondary | ICD-10-CM | POA: Diagnosis not present

## 2020-11-12 NOTE — Progress Notes (Signed)
Patient ID: Rickey Cruz, male    DOB: 20-Aug-1961  Age: 59 y.o. MRN: [id number]  Chief Complaint  Patient presents with  . Sore Throat    pt choked on water today and notes his throat is sore now after coughing. pt also notes this has become more and more common choking while swallowing     Subjective:   Patient has a sore throat, but his main concern is the choking.  He has had several episodes lately but today was a bad one.  He felt like he could not breathe.  He was choked up with very thick dry mucus in his throat.  He had to signal a coworker who came over and patted him on the back and he indicated to the coworker to perform a Heimlich on him.  He cleared on up.  The throat is stayed sore.  He has been coughing and having some very dry mucous.  He has a history of being prediabetic but never diabetic or high sugars.  He also is concerned about his hair loss and would like something to help him grow hair on the top of his head.  Finally he would like an HIV testing done.  Current allergies, medications, problem list, past/family and social histories reviewed.  Objective:  BP 114/77   Pulse 88   Temp 98.4 F (36.9 C) (Temporal)   Resp 16   Ht 5\' 6"  (1.676 m)   Wt 172 lb 6.4 oz (78.2 kg)   SpO2 98%   BMI 27.83 kg/m   No major acute distress.  He does have thin hair on the top of his head.  His throat is clear.  He has had a uvulectomy many years ago for sleep apnea.  No erythema or exudate.  Neck supple without nodes.  Chest is clear to auscultation.  Heart regular without murmurs, gallops, or arrhythmias.  Abdomen soft without mass or tenderness.  Does not sound like any reflux.  Assessment & Plan:   Assessment: 1. Choking due to phlegm in larynx, initial encounter   2. Hair loss   3. Prediabetes   4. Glucose intolerance (impaired glucose tolerance)   5. Screening for HIV (human immunodeficiency virus)       Plan: We will check his blood sugar because I have seen high  blood sugars because inspissated mucus.  Also will check HIV per his request.  See my instructions.  Orders Placed This Encounter  Procedures  . Basic metabolic panel  . Hemoglobin A1c  . HIV Antibody (routine testing w rflx)    No orders of the defined types were placed in this encounter.        Patient Instructions    Drink lots of water to maintain good hydration and keep mucus thin.  Take over-the-counter Mucinex twice a day for a little while and see if that helps your symptoms.  It thins the mucus.  If you keep having a lot of trouble see your pulmonary doctor  Use the inhaler as directed  You can use over-the-counter generic Rogaine for hair.  The problem is it has to be used very faithfully and continue using it for it to do any good, but you can give it a try if you wish.  I know a few patients who have been happy with it but many who have quit using it.  In the event of a bad choking episode at home call 911 and unlock your door, but do not attempt to  drive.  Return as needed  If you have lab work done today you will be contacted with your lab results within the next 2 weeks.  If you have not heard from [place] then please contact us. The fastest way to get your results is to register for My Chart.   IF you received an x-ray today, you will receive an invoice from [facility]. Please contact [facility] at [phone] with questions or concerns regarding your invoice.   IF you received labwork today, you will receive an invoice from [facility]. Please contact [facility] at [phone] with questions or concerns regarding your invoice.   Our billing staff will not be able to assist you with questions regarding bills from these companies.  You will be contacted with the lab results as soon as they are available. The fastest way to get your results is to activate your My Chart account. Instructions are located on the last page of this paperwork. If you  have not heard from [place] regarding the results in 2 weeks, please contact this office.        Return if symptoms worsen or fail to improve.   Ruben Reason, MD 11/12/2020

## 2020-11-12 NOTE — Patient Instructions (Addendum)
  Drink lots of water to maintain good hydration and keep mucus thin.  Take over-the-counter Mucinex twice a day for a little while and see if that helps your symptoms.  It thins the mucus.  If you keep having a lot of trouble see your pulmonary doctor  Use the inhaler as directed  You can use over-the-counter generic Rogaine for hair.  The problem is it has to be used very faithfully and continue using it for it to do any good, but you can give it a try if you wish.  I know a few patients who have been happy with it but many who have quit using it.  In the event of a bad choking episode at home call 911 and unlock your door, but do not attempt to drive.  Return as needed  If you have lab work done today you will be contacted with your lab results within the next 2 weeks.  If you have not heard from [place] then please contact us. The fastest way to get your results is to register for My Chart.   IF you received an x-ray today, you will receive an invoice from [facility]. Please contact [facility] at [phone] with questions or concerns regarding your invoice.   IF you received labwork today, you will receive an invoice from [place]. Please contact [facility] at [phone] with questions or concerns regarding your invoice.   Our billing staff will not be able to assist you with questions regarding bills from these companies.  You will be contacted with the lab results as soon as they are available. The fastest way to get your results is to activate your My Chart account. Instructions are located on the last page of this paperwork. If you have not heard from [place] regarding the results in 2 weeks, please contact this office.

## 2020-11-13 LAB — BASIC METABOLIC PANEL
BUN/Creatinine Ratio: 23 — ABNORMAL HIGH (ref 9–20)
BUN: 24 mg/dL (ref 6–24)
CO2: 25 mmol/L (ref 20–29)
Calcium: 9.3 mg/dL (ref 8.7–10.2)
Chloride: 103 mmol/L (ref 96–106)
Creatinine, Ser: 1.03 mg/dL (ref 0.76–1.27)
GFR calc Af Amer: 91 mL/min/{1.73_m2} (ref >59)
GFR calc non Af Amer: 79 mL/min/{1.73_m2} (ref >59)
Glucose: 120 mg/dL — ABNORMAL HIGH (ref 65–99)
Potassium: 3.4 mmol/L — ABNORMAL LOW (ref 3.5–5.2)
Sodium: 147 mmol/L — ABNORMAL HIGH (ref 134–144)

## 2020-11-13 LAB — HIV ANTIBODY (ROUTINE TESTING W REFLEX): HIV Screen 4th Generation wRfx: NONREACTIVE

## 2020-11-13 LAB — HEMOGLOBIN A1C
Est. average glucose Bld gHb Est-mCnc: 123 mg/dL
Hgb A1c MFr Bld: 5.9 % — ABNORMAL HIGH (ref 4.8–5.6)

## 2020-11-30 NOTE — Progress Notes (Signed)
New Patient Note  RE: Rickey Cruz MRN: [id number] DOB: [birth year] Date of Office Visit: 12/01/2020  Referring provider: No ref. provider found Primary care provider: Rutherford Guys, MD (Inactive)  Chief Complaint: Asthma and Allergic Rhinitis   History of Present Illness: I had the pleasure of seeing Rickey Cruz for initial evaluation at the [facility] on 12/01/2020. He is a 59 y.o. male, who is self-referred here for re-establishing care for asthma and allergies. Saw Dr. Ishmael Holter previously in 2017 for asthma and allergic rhino conjunctivitis.  Rhinitis: He reports symptoms of itchy eyes, PND, nasal congestion, itchy ears, rhinorrhea. Symptoms have been going on for 20+ years. The symptoms are present all year around with worsening in the fall. Other triggers include exposure to unknown. Anosmia: no. Headache: no. He has used Flonase 2 sprays daily prn and Singulair prn with some improvement in symptoms. OTC antihistamines do not work. Sinus infections: no. Previous work up includes: 1999 skin testing was positive to grass, weed, ragweed, trees, cat, dog and dust mites. 2005 skin testing was positive to grass, weed, trees, cat, dog. Patient was on allergy injections for 2-3 years with good benefit. Symptoms worsening over the years and interested in restarting allergy immunotherapy. Previous ENT evaluation: not recently. Previous sinus imaging: no. History of nasal polyps: no. Last eye exam: last year. History of reflux: sometimes but does not take daily medication for this.  Asthma: He reports symptoms of wheezing for 20+ years. Current medications include Qvar and Flovent which help. He takes one or the other twice a day. He reports not using aerochamber with inhalers. He tried the following inhalers: albuterol. Main triggers are unknown. In the last month, frequency of symptoms: <1x/week. Frequency of nocturnal symptoms: 0x/month. Frequency of SABA use: <1x/week.  Interference with physical activity: no. Sleep is undisturbed. In the last 12 months, emergency room visits/urgent care visits/doctor office visits or hospitalizations due to respiratory issues: no. In the last 12 months, oral steroids courses: no. Lifetime history of hospitalization for respiratory issues: no. Prior intubations: no. History of pneumonia: no. He was evaluated by allergist/pulmonologist in the past. Smoking exposure: no. Up to date with flu vaccine: yes. Up to date with COVID-19 vaccine: yes. No prior COVID-19 diagnosis.    Assessment and Plan: Isaic is a 59 y.o. male with: Other allergic rhinitis Perennial rhinoconjunctivitis symptoms for 20+ years with worsening in the fall.  Skin testing in 2005 was positive to grass, weed, trees, cat and dog.  Patient was on allergy immunotherapy for a few years with good benefit.  Worsening symptoms and interested in restarting allergy shots.  Today's skin testing showed: Positive to grass, trees, mold, dust mites.  Start environmental control measures as below.  Continue Singulair (montelukast) 10mg  daily at night.  May use over the counter antihistamines such as Zyrtec (cetirizine), Claritin (loratadine), Allegra (fexofenadine), or Xyzal (levocetirizine) daily as needed. May take twice a day during flares.  May use Nasonex (mometasone) nasal spray 1 spray per nostril twice a day as needed for nasal congestion.   May use olopatadine eye drops 0.2% once a day as needed for itchy/watery eyes.  Start allergy injections at [facility] office.  Had a detailed discussion with patient/family that clinical history is suggestive of allergic rhinitis, and may benefit from allergy immunotherapy (AIT). Discussed in detail regarding the dosing, schedule, side effects (mild to moderate local allergic reaction and rarely systemic allergic reactions including anaphylaxis), and benefits (significant improvement in nasal symptoms,  seasonal flares of asthma)  of immunotherapy with the patient. There is significant time commitment involved with allergy shots, which includes weekly immunotherapy injections for first 9-12 months and then biweekly to monthly injections for 3-5 years. Consent was signed.   I have prescribed epinephrine injectable and demonstrated proper use. For mild symptoms you can take over the counter antihistamines such as Benadryl and monitor symptoms closely. If symptoms worsen or if you have severe symptoms including breathing issues, throat closure, significant swelling, whole body hives, severe diarrhea and vomiting, lightheadedness then inject epinephrine and seek immediate medical care afterwards.  Emergency action plan given.  Allergic conjunctivitis of both eyes  See assessment and plan as above for allergic rhinitis.  Rash and other nonspecific skin eruption Patient concerned about possible dairy and egg allergy.  Noticed some facial rash outbreak the day after dairy ingestion especially.  Today's skin testing negative to milk, casein and eggs  Okay to eat eggs.  Monitor symptoms after dairy ingestion.   Moderate persistent asthma without complication Diagnosed with asthma over 20 years ago and being followed by pulmonology currently.  Uses Qvar or Flovent twice a day and does not have a current albuterol inhaler at home. No triggers noted.  Today's spirometry was normal. . Daily controller medication(s): use inhalers as per your pulmonologist. o Qvar 35mcg 2 puffs twice a day and rinse mouth after each use.  . Continue Singulair (montelukast) 10mg  daily at night. . May use albuterol rescue inhaler 2 puffs every 4 to 6 hours as needed for shortness of breath, chest tightness, coughing, and wheezing. May use albuterol rescue inhaler 2 puffs 5 to 15 minutes prior to strenuous physical activities. Monitor frequency of use.  . Discussed the difference between a maintenance ICS inhaler and a rescue SABA inhaler.   Return  in about 4 months (around 04/01/2021).  Meds ordered this encounter  Medications  . Olopatadine HCl 0.2 % SOLN    Sig: Apply 1 drop to eye daily as needed (itchy/watery eyes).    Dispense:  2.5 mL    Refill:  5  . mometasone (NASONEX) 50 MCG/ACT nasal spray    Sig: Place 1-2 sprays into the nose daily as needed (nasal symptoms).    Dispense:  1 each    Refill:  5  . albuterol (VENTOLIN HFA) 108 (90 Base) MCG/ACT inhaler    Sig: Inhale 2 puffs into the lungs every 4 (four) hours as needed for wheezing or shortness of breath (coughing fits).    Dispense:  8 g    Refill:  2  . EPINEPHrine (AUVI-Q) 0.3 mg/0.3 mL IJ SOAJ injection    Sig: Inject 0.3 mg into the muscle as needed for anaphylaxis.    Dispense:  1 each    Refill:  2   Other allergy screening: Food allergy: no  Breaking out in rash around the face the day after eating dairy products. No other symptoms. No issues with eggs. Medication allergy: no Hymenoptera allergy: no Urticaria: no Eczema:no History of recurrent infections suggestive of immunodeficency: no  Diagnostics: Spirometry:  Tracings reviewed. His effort: Good reproducible efforts. FVC: 4.05L FEV1: 3.38L, 107% predicted FEV1/FVC ratio: 83% Interpretation: Spirometry consistent with normal pattern.  Please see scanned spirometry results for details.  Skin Testing: Environmental allergy panel. Positive to grass, trees, mold, dust mites. Negative to milk, casein and egg.  Results discussed with patient/family.  Airborne Adult Perc - 12/01/20 1432    Time Antigen Placed 1432  Allergen Manufacturer Greer    Location Back    Number of Test 59    Panel 1 Select    1. Control-Buffer 50% Glycerol Negative    2. Control-Histamine 1 mg/ml 2+    3. Albumin saline Negative    4. Tripp 4+    5. [place] 4+    6. Johnson 3+    7. Kentucky Blue 2+    8. Meadow Fescue 2+    9. Perennial Rye 2+    10. Sweet Vernal 2+    11. Timothy 2+    12. Cocklebur  Negative    13. Burweed Marshelder Negative    14. Ragweed, short Negative    15. Ragweed, Giant Negative    16. Plantain,  English Negative    17. Lamb's Quarters Negative    18. Sheep Sorrell Negative    19. Rough Pigweed Negative    20. Marsh Elder, Rough Negative    21. Mugwort, Common Negative    22. Ash mix Negative    23. Birch mix 4+    24. Beech American 2+    25. Box, Elder Negative    26. Cedar, red Negative    27. Cottonwood, [place] Negative    28. Elm mix Negative    29. Hickory 4+    30. Maple mix --   +/-   31. Oak, [place] mix 3+    32. Pecan Pollen 2+    33. Pine mix Negative    34. Sycamore Eastern Negative    35. Yale, Black Pollen 2+    36. Alternaria alternata Negative    37. Cladosporium Herbarum Negative    38. Aspergillus mix Negative    39. Penicillium mix Negative    40. Bipolaris sorokiniana (Helminthosporium) Negative    41. Drechslera spicifera (Curvularia) 2+    42. Mucor plumbeus Negative    43. Fusarium moniliforme 2+    44. Aureobasidium pullulans (pullulara) 2+    45. Rhizopus oryzae Negative    46. Botrytis cinera Negative    47. Epicoccum nigrum Negative    48. Phoma betae Negative    49. Candida Albicans Negative    50. Trichophyton mentagrophytes Negative    51. Mite, D Farinae  5,000 AU/ml Negative    52. Mite, D Pteronyssinus  5,000 AU/ml Negative    53. Cat Hair 10,000 BAU/ml Negative    54.  Dog Epithelia Negative    55. Mixed Feathers Negative    56. Horse Epithelia Negative    57. Cockroach, German Negative    58. Mouse Negative    59. Tobacco Leaf Negative          Intradermal - 12/01/20 1516    Time Antigen Placed 1516    Allergen Manufacturer Lavella Hammock    Location Back    Number of Test 9    Intradermal Select    Control Negative    Ragweed mix Negative    Weed mix Negative    Mold 1 Negative    Mold 2 Negative    Cat Negative    Dog Negative    Cockroach Negative    Mite mix 2+          Food Adult Perc  - 12/01/20 1400    Time Antigen Placed 1432    Allergen Manufacturer Lavella Hammock    Location Back    Number of allergen test 3    Control-Histamine 1 mg/ml 2+    5. Milk, cow Negative  6. Egg White, Chicken Negative    7. Casein Negative           Past Medical History: Patient Active Problem List   Diagnosis Date Noted  . Moderate persistent asthma without complication 09/62/8366  . Allergic conjunctivitis of both eyes 12/01/2020  . Rash and other nonspecific skin eruption 12/01/2020  . Healthcare maintenance 08/28/2020  . Abdominal pain, left upper quadrant 07/24/2019  . Right foot pain 04/02/2019  . Prediabetes 04/02/2019  . Acute gouty arthritis 04/02/2019  . Cluster headache 11/21/2018  . Glucose intolerance (impaired glucose tolerance) 12/23/2014  . Hyperlipidemia 12/23/2014  . Psoriasis 06/27/2014  . Multiple lung nodules 12/09/2011  . PULMONARY HYPERTENSION 03/04/2010  . Essential hypertension 02/10/2010  . Other allergic rhinitis 02/10/2010  . Asthma in adult 02/10/2010  . OBSTRUCTIVE SLEEP APNEA 02/04/2010  . TUBERCULOSIS, HX OF 02/04/2010   Past Medical History:  Diagnosis Date  . Allergy    Flonase, Patanol, Zyrtec  . Arthritis    psoriasis  . Asthma    seasonal, with allergies  . Borderline diabetes   . Cluster headache   . Colon polyp 10/27/2011  . Depression    denies 11/11/16; denies 11/21/18 "been years"  . GERD (gastroesophageal reflux disease)   . History of kidney stones    x1  . Hyperlipidemia   . Hypertension   . Low back pain    "in kidney area-was told that it was related to gallstones"  . Prediabetes   . Psoriasis    [name]  . Severe sleep apnea    h/o; had procedure and weight control, does not have to wear CPAP  . 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 APPENDECTOMY    . CHOLECYSTECTOMY N/A 12/16/2014   Procedure:  LAPAROSCOPIC CHOLECYSTECTOMY WITH INTRAOPERATIVE CHOLANGIOGRAM;  Surgeon: [name], MD;  Location: [facility];  Service: General;  Laterality: N/A;  . COLONOSCOPY  10/27/2011   single polyp. Repeat 5 years.  Marland Kitchen POLYPECTOMY    . PROSTATE BIOPSY  10/2014   will have another in 3 months  . SEPTOPLASTY  2014  . TONSILLECTOMY  2014  . UVULOPALATOPHARYNGOPLASTY  2014   Medication List:  Current Outpatient Medications  Medication Sig Dispense Refill  . amLODipine (NORVASC) 5 MG tablet TAKE 1 TABLET(5 MG) BY MOUTH DAILY 90 tablet 0  . aspirin EC 325 MG tablet Take 325 mg by mouth as needed.    . beclomethasone (QVAR) 80 MCG/ACT inhaler Inhale 2 puffs into the lungs 2 (two) times daily. 26.1 g 3  . clobetasol (OLUX) 0.05 % topical foam APPLY EXTERNALLY TO THE AFFECTED AREA TWICE DAILY 50 g 3  . clobetasol ointment (TEMOVATE) 0.05 % APPLY EXTERNALLY TO THE AFFECTED AREA TWICE DAILY 30 g 3  . fluocinolone (VANOS) 0.01 % cream Apply topically daily. 30 g 1  . fluocinonide cream (LIDEX) 0.05 % APPLY SPARINGLY EXTERNALLY TO THE AFFECTED AREA TWICE DAILY 30 g 1  . fluticasone (FLOVENT HFA) 110 MCG/ACT inhaler Inhale 2 puffs into the lungs 2 (two) times daily. 3 Inhaler 4  . hydrochlorothiazide (HYDRODIURIL) 12.5 MG tablet TAKE 1 TABLET BY MOUTH DAILY 90 tablet 1  . ibuprofen (ADVIL) 600 MG tablet Take 1 tablet (600 mg total) by mouth every 6 (six) hours as needed. 30 tablet 0  . ketoconazole (NIZORAL) 2 % shampoo APPLY EXTERNALLY 2 TIMES A WEEK. 120 mL 3  .  montelukast (SINGULAIR) 10 MG tablet TAKE 1 TABLET(10 MG) BY MOUTH AT BEDTIME 90 tablet 3  . Multiple Vitamin (MULTIVITAMIN) capsule Take 1 capsule by mouth daily. OTC    . OTEZLA 30 MG TABS Take 1 tablet by mouth 2 (two) times daily.    . rosuvastatin (CRESTOR) 10 MG tablet TAKE 1 TABLET BY MOUTH DAILY 90 tablet 1  . albuterol (VENTOLIN HFA) 108 (90 Base) MCG/ACT inhaler Inhale 2 puffs into the lungs every 4 (four) hours as needed for wheezing or  shortness of breath (coughing fits). 8 g 2  . EPINEPHrine (AUVI-Q) 0.3 mg/0.3 mL IJ SOAJ injection Inject 0.3 mg into the muscle as needed for anaphylaxis. 1 each 2  . gabapentin (NEURONTIN) 100 MG capsule Take 1 capsule (100 mg total) by mouth 3 (three) times daily for 7 days. 21 capsule 0  . gabapentin (NEURONTIN) 100 MG capsule Take 100 mg by mouth 3 (three) times daily. (Patient not taking: Reported on 12/01/2020)    . hyoscyamine (LEVSIN SL) 0.125 MG SL tablet Place 1 tablet (0.125 mg total) under the tongue every 8 (eight) hours as needed. (Patient not taking: Reported on 11/12/2020) 30 tablet 0  . mometasone (NASONEX) 50 MCG/ACT nasal spray Place 1-2 sprays into the nose daily as needed (nasal symptoms). 1 each 5  . Olopatadine HCl 0.2 % SOLN Apply 1 drop to eye daily as needed (itchy/watery eyes). 2.5 mL 5  . tiZANidine (ZANAFLEX) 4 MG tablet Take 1 tablet (4 mg total) by mouth every 8 (eight) hours as needed for muscle spasms. (Patient not taking: Reported on 11/12/2020) 30 tablet 0   No current facility-administered medications for this visit.   Allergies: No Known Allergies Social History: Social History   Socioeconomic History  . Marital status: Divorced    Spouse name: Not on file  . Number of children: 1  . Years of education: Not on file  . Highest education level: Doctorate  Occupational History  . Occupation: Product manager: [employer]    Comment: Pharmacist, community  Tobacco Use  . Smoking status: Never Smoker  . Smokeless tobacco: Never Used  Vaping Use  . Vaping Use: Never used  Substance and Sexual Activity  . Alcohol use: Never    Alcohol/week: 0.0 standard drinks  . Drug use: Never  . Sexual activity: Yes    Comment: SSP  Other Topics Concern  . Not on file  Social History Narrative   Original from [place]; moved to [place] 1995.   Marital status: same sexual partner 4 X years.   Children:  1 child/son (18) at 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        Tobacco: never      Alcohol: none      Drugs: none       Exercise: daily in 2018; 10,000 steps daily; lots of walking on campus at work      ADLs: independent with ADLs.        Sexual activity: dating males only in 2018; bisexual.  Previously married to male.       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         Social Determinants of Health   Financial Resource Strain:   . Difficulty of Paying Living Expenses: Not on file  Food Insecurity:   . Worried About Charity fundraiser in the Last Year: Not on file  . Ran Out of Food in the Last Year: Not on file  Transportation Needs:   . Lack of Transportation (Medical): Not on file  . Lack of Transportation (Non-Medical): Not on file  Physical Activity:   . Days of Exercise per Week: Not on file  . Minutes of Exercise per Session: Not on file  Stress:   . Feeling of Stress : Not on file  Social Connections:   . Frequency of Communication with Friends and Family: Not on file  . Frequency of Social Gatherings with Friends and Family: Not on file  . Attends Religious Services: Not on file  . Active Member of Clubs or Organizations: Not on file  . Attends Archivist Meetings: Not on file  . Marital Status: Not on file   Lives in a 42+ year old [place]. Smoking: denies Occupation: Psychiatric nurse HistoryFreight forwarder in the house: no Carpet in the family room: no Carpet in the bedroom: yes Heating: electric Cooling: central Pet: no  Family History: Family History  Problem Relation  Age of Onset  . Stroke Mother   . Asthma Sister   . Colon cancer Neg Hx   . Esophageal cancer Neg Hx   . Stomach cancer Neg Hx   . Prostate cancer Neg Hx   . Diabetes Neg Hx   . CAD Neg Hx   . Migraines Neg Hx   . Headache Neg Hx    Review of Systems  Constitutional: Negative for appetite change, chills, fever and unexpected weight change.  HENT: Positive for congestion, postnasal drip, rhinorrhea and sneezing.   Eyes: Positive for itching.  Respiratory: Negative for cough, chest tightness, shortness of breath and wheezing.   Cardiovascular: Negative for chest pain.  Gastrointestinal: Negative for abdominal pain.  Genitourinary: Negative for difficulty urinating.  Skin: Negative for rash.  Allergic/Immunologic: Positive for environmental allergies.  Neurological: Negative for headaches.   Objective: BP (!) 122/92 (BP Location: Left Arm, Patient Position: Sitting, Cuff Size: Normal)   Pulse 77   Temp 97.7 F (36.5 C) (Temporal)   Resp 18   Ht 5' 6.54" (1.69 m)   Wt 173 lb 4.8 oz (78.6 kg)   SpO2 97%   BMI 27.52 kg/m  Body mass index is 27.52 kg/m. Physical Exam Vitals and nursing note reviewed.  Constitutional:      Appearance: Normal appearance. He is well-developed.  HENT:     Head: Normocephalic and atraumatic.     Right Ear: External ear normal.     Left Ear: External ear normal.     Nose: Nose normal.     Mouth/Throat:     Mouth: Mucous membranes are moist.     Pharynx: Oropharynx is clear.  Eyes:     Conjunctiva/sclera: Conjunctivae normal.  Cardiovascular:     Rate and Rhythm: Normal rate and regular rhythm.     Heart sounds: Normal heart sounds. No murmur heard.  No friction rub. No gallop.   Pulmonary:     Effort: Pulmonary effort is normal.     Breath sounds: Normal breath sounds. No wheezing, rhonchi or rales.  Abdominal:     Palpations: Abdomen is  soft.  Musculoskeletal:     Cervical back: Neck supple.  Skin:    General: Skin is warm.      Findings: No rash.  Neurological:     Mental Status: He is alert and oriented to person, place, and time.  Psychiatric:        Behavior: Behavior normal.    The plan was reviewed with the patient/family, and all questions/concerned were addressed.  It was my pleasure to see Kayl today and participate in his care. Please feel free to contact me with any questions or concerns.  Sincerely,  Rexene Alberts, DO Allergy & Immunology  [facility] office: [place] office: [phone]

## 2020-12-01 ENCOUNTER — Encounter: Payer: Self-pay | Admitting: Allergy

## 2020-12-01 ENCOUNTER — Other Ambulatory Visit: Payer: Self-pay

## 2020-12-01 ENCOUNTER — Ambulatory Visit: Payer: BC Managed Care – PPO | Admitting: Allergy

## 2020-12-01 VITALS — BP 122/92 | HR 77 | Temp 97.7°F | Resp 18 | Ht 66.54 in | Wt 173.3 lb

## 2020-12-01 DIAGNOSIS — R21 Rash and other nonspecific skin eruption: Secondary | ICD-10-CM | POA: Diagnosis not present

## 2020-12-01 DIAGNOSIS — H1013 Acute atopic conjunctivitis, bilateral: Secondary | ICD-10-CM | POA: Diagnosis not present

## 2020-12-01 DIAGNOSIS — J4540 Moderate persistent asthma, uncomplicated: Secondary | ICD-10-CM

## 2020-12-01 DIAGNOSIS — J3089 Other allergic rhinitis: Secondary | ICD-10-CM | POA: Diagnosis not present

## 2020-12-01 MED ORDER — EPINEPHRINE 0.3 MG/0.3ML IJ SOAJ: 0.3000 mg | INTRAMUSCULAR | 2 refills | Status: DC | PRN

## 2020-12-01 MED ORDER — MOMETASONE FUROATE 50 MCG/ACT NA SUSP
1.0000 | Freq: Every day | NASAL | 5 refills | Status: DC | PRN
Start: 2020-12-01 — End: 2021-11-02

## 2020-12-01 MED ORDER — OLOPATADINE HCL 0.2 % OP SOLN: 1.0000 [drp] | Freq: Every day | OPHTHALMIC | 5 refills | Status: DC | PRN

## 2020-12-01 MED ORDER — ALBUTEROL SULFATE HFA 108 (90 BASE) MCG/ACT IN AERS: 2.0000 | INHALATION_SPRAY | RESPIRATORY_TRACT | 2 refills | Status: DC | PRN

## 2020-12-01 NOTE — Assessment & Plan Note (Signed)
   See assessment and plan as above for allergic rhinitis.  

## 2020-12-01 NOTE — Assessment & Plan Note (Signed)
Patient concerned about possible dairy and egg allergy.  Noticed some facial rash outbreak the day after dairy ingestion especially.  Today's skin testing negative to milk, casein and eggs  Okay to eat eggs.  Monitor symptoms after dairy ingestion.

## 2020-12-01 NOTE — Assessment & Plan Note (Signed)
Perennial rhinoconjunctivitis symptoms for 20+ years with worsening in the fall.  Skin testing in 2005 was positive to grass, weed, trees, cat and dog.  Patient was on allergy immunotherapy for a few years with good benefit.  Worsening symptoms and interested in restarting allergy shots.  Today's skin testing showed: Positive to grass, trees, mold, dust mites.  Start environmental control measures as below.  Continue Singulair (montelukast) 10mg  daily at night.  May use over the counter antihistamines such as Zyrtec (cetirizine), Claritin (loratadine), Allegra (fexofenadine), or Xyzal (levocetirizine) daily as needed. May take twice a day during flares.  May use Nasonex (mometasone) nasal spray 1 spray per nostril twice a day as needed for nasal congestion.   May use olopatadine eye drops 0.2% once a day as needed for itchy/watery eyes.  Start allergy injections at [facility] office.  Had a detailed discussion with patient/family that clinical history is suggestive of allergic rhinitis, and may benefit from allergy immunotherapy (AIT). Discussed in detail regarding the dosing, schedule, side effects (mild to moderate local allergic reaction and rarely systemic allergic reactions including anaphylaxis), and benefits (significant improvement in nasal symptoms, seasonal flares of asthma) of immunotherapy with the patient. There is significant time commitment involved with allergy shots, which includes weekly immunotherapy injections for first 9-12 months and then biweekly to monthly injections for 3-5 years. Consent was signed.   I have prescribed epinephrine injectable and demonstrated proper use. For mild symptoms you can take over the counter antihistamines such as Benadryl and monitor symptoms closely. If symptoms worsen or if you have severe symptoms including breathing issues, throat closure, significant swelling, whole body hives, severe diarrhea and vomiting, lightheadedness then inject  epinephrine and seek immediate medical care afterwards.  Emergency action plan given.

## 2020-12-01 NOTE — Patient Instructions (Addendum)
Today's skin testing showed: Positive to grass, trees, mold, dust mites. Negative to milk, casein and egg.   Environmental allergies  Start environmental control measures as below.  Continue Singulair (montelukast) 10mg  daily at night.  May use over the counter antihistamines such as Zyrtec (cetirizine), Claritin (loratadine), Allegra (fexofenadine), or Xyzal (levocetirizine) daily as needed. May take twice a day during flares.  May use Nasonex (mometasone) nasal spray 1 spray per nostril twice a day as needed for nasal congestion.   May use olopatadine eye drops 0.2% once a day as needed for itchy/watery eyes.   Start allergy injections at [facility] office.  Had a detailed discussion with patient/family that clinical history is suggestive of allergic rhinitis, and may benefit from allergy immunotherapy (AIT). Discussed in detail regarding the dosing, schedule, side effects (mild to moderate local allergic reaction and rarely systemic allergic reactions including anaphylaxis), and benefits (significant improvement in nasal symptoms, seasonal flares of asthma) of immunotherapy with the patient. There is significant time commitment involved with allergy shots, which includes weekly immunotherapy injections for first 9-12 months and then biweekly to monthly injections for 3-5 years. Consent was signed.   I have prescribed epinephrine injectable and demonstrated proper use. For mild symptoms you can take over the counter antihistamines such as Benadryl and monitor symptoms closely. If symptoms worsen or if you have severe symptoms including breathing issues, throat closure, significant swelling, whole body hives, severe diarrhea and vomiting, lightheadedness then inject epinephrine and seek immediate medical care afterwards.  Emergency action plan given.  Asthma: . Your breathing was normal today.  . Daily controller medication(s): use inhalers as per your pulmonologist. o Qvar 64mcg 2 puffs  twice a day and rinse mouth after each use.  . Continue Singulair (montelukast) 10mg  daily at night. . May use albuterol rescue inhaler 2 puffs every 4 to 6 hours as needed for shortness of breath, chest tightness, coughing, and wheezing. May use albuterol rescue inhaler 2 puffs 5 to 15 minutes prior to strenuous physical activities. Monitor frequency of use.  . Asthma control goals:  o Full participation in all desired activities (may need albuterol before activity) o Albuterol use two times or less a week on average (not counting use with activity) o Cough interfering with sleep two times or less a month o Oral steroids no more than once a year o No hospitalizations  Foods:  Skin testing negative to milk and eggs  Okay to eat eggs.  Monitor symptoms after dairy ingestion.   Follow up in 4 months or sooner if needed in the [place] office. Follow up in 3 weeks for first allergy injection.  Reducing Pollen Exposure . Pollen seasons: trees (spring), grass (summer) and ragweed/weeds (fall). Marland Kitchen Keep windows closed in your home and car to lower pollen exposure.  Susa Simmonds air conditioning in the bedroom and throughout the house if possible.  . Avoid going out in dry windy days - especially early morning. . Pollen counts are highest between 5 - 10 AM and on dry, hot and windy days.  . Save outside activities for late afternoon or after a heavy rain, when pollen levels are lower.  . Avoid mowing of grass if you have grass pollen allergy. Marland Kitchen Be aware that pollen can also be transported indoors on people and pets.  . Dry your clothes in an automatic dryer rather than hanging them outside where they might collect pollen.  . Rinse hair and eyes before bedtime. Mold Control . Mold and fungi  can grow on a variety of surfaces provided certain temperature and moisture conditions exist.  . Outdoor molds grow on plants, decaying vegetation and soil. The major outdoor mold, Alternaria and  Cladosporium, are found in very high numbers during hot and dry conditions. Generally, a late summer - fall peak is seen for common outdoor fungal spores. Rain will temporarily lower outdoor mold spore count, but counts rise rapidly when the rainy period ends. . The most important indoor molds are Aspergillus and Penicillium. Dark, humid and poorly ventilated basements are ideal sites for mold growth. The next most common sites of mold growth are the bathroom and the kitchen. Outdoor (Seasonal) Mold Control . Use air conditioning and keep windows closed. . Avoid exposure to decaying vegetation. Marland Kitchen Avoid leaf raking. . Avoid grain handling. . Consider wearing a face mask if working in moldy areas.  Indoor (Perennial) Mold Control  . Maintain humidity below 50%. . Get rid of mold growth on hard surfaces with water, detergent and, if necessary, 5% bleach (do not mix with other cleaners). Then dry the area completely. If mold covers an area more than 10 square feet, consider hiring an indoor environmental professional. . For clothing, washing with soap and water is best. If moldy items cannot be cleaned and dried, throw them away. . Remove sources e.g. contaminated carpets. . Repair and seal leaking roofs or pipes. Using dehumidifiers in damp basements may be helpful, but empty the water and clean units regularly to prevent mildew from forming. All rooms, especially basements, bathrooms and kitchens, require ventilation and cleaning to deter mold and mildew growth. Avoid carpeting on concrete or damp floors, and storing items in damp areas.  Control of House Dust Mite Allergen . Dust mite allergens are a common trigger of allergy and asthma symptoms. While they can be found throughout the house, these microscopic creatures thrive in warm, humid environments such as bedding, upholstered furniture and carpeting. . Because so much time is spent in the bedroom, it is essential to reduce mite levels there.   . Encase pillows, mattresses, and box springs in special allergen-proof fabric covers or airtight, zippered plastic covers.  . Bedding should be washed weekly in hot water (130 F) and dried in a hot dryer. Allergen-proof covers are available for comforters and pillows that can't be regularly washed.  Wendee Copp the allergy-proof covers every few months. Minimize clutter in the bedroom. Keep pets out of the bedroom.  Marland Kitchen Keep humidity less than 50% by using a dehumidifier or air conditioning. You can buy a humidity measuring device called a hygrometer to monitor this.  . If possible, replace carpets with hardwood, linoleum, or washable area rugs. If that's not possible, vacuum frequently with a vacuum that has a HEPA filter. . Remove all upholstered furniture and non-washable window drapes from the bedroom. . Remove all non-washable stuffed toys from the bedroom.  Wash stuffed toys weekly.

## 2020-12-01 NOTE — Assessment & Plan Note (Signed)
Diagnosed with asthma over 20 years ago and being followed by pulmonology currently.  Uses Qvar or Flovent twice a day and does not have a current albuterol inhaler at home. No triggers noted.  Today's spirometry was normal. . Daily controller medication(s): use inhalers as per your pulmonologist. o Qvar 22mcg 2 puffs twice a day and rinse mouth after each use.  . Continue Singulair (montelukast) 10mg  daily at night. . May use albuterol rescue inhaler 2 puffs every 4 to 6 hours as needed for shortness of breath, chest tightness, coughing, and wheezing. May use albuterol rescue inhaler 2 puffs 5 to 15 minutes prior to strenuous physical activities. Monitor frequency of use.  . Discussed the difference between a maintenance ICS inhaler and a rescue SABA inhaler.

## 2020-12-02 DIAGNOSIS — J301 Allergic rhinitis due to pollen: Secondary | ICD-10-CM

## 2020-12-02 NOTE — Progress Notes (Signed)
VIALS EXP 12-02-21

## 2020-12-03 DIAGNOSIS — J3089 Other allergic rhinitis: Secondary | ICD-10-CM | POA: Diagnosis not present

## 2020-12-04 ENCOUNTER — Ambulatory Visit: Payer: BC Managed Care – PPO | Admitting: Family Medicine

## 2020-12-15 ENCOUNTER — Ambulatory Visit: Payer: BC Managed Care – PPO | Admitting: Emergency Medicine

## 2020-12-15 ENCOUNTER — Encounter: Payer: Self-pay | Admitting: Emergency Medicine

## 2020-12-15 ENCOUNTER — Other Ambulatory Visit: Payer: Self-pay

## 2020-12-15 VITALS — BP 121/79 | HR 61 | Temp 98.2°F | Resp 16 | Ht 66.0 in | Wt 171.0 lb

## 2020-12-15 DIAGNOSIS — M542 Cervicalgia: Secondary | ICD-10-CM

## 2020-12-15 DIAGNOSIS — G4733 Obstructive sleep apnea (adult) (pediatric): Secondary | ICD-10-CM

## 2020-12-15 DIAGNOSIS — Z8739 Personal history of other diseases of the musculoskeletal system and connective tissue: Secondary | ICD-10-CM

## 2020-12-15 DIAGNOSIS — I272 Pulmonary hypertension, unspecified: Secondary | ICD-10-CM | POA: Diagnosis not present

## 2020-12-15 DIAGNOSIS — I10 Essential (primary) hypertension: Secondary | ICD-10-CM

## 2020-12-15 DIAGNOSIS — K644 Residual hemorrhoidal skin tags: Secondary | ICD-10-CM

## 2020-12-15 DIAGNOSIS — L409 Psoriasis, unspecified: Secondary | ICD-10-CM

## 2020-12-15 DIAGNOSIS — R7303 Prediabetes: Secondary | ICD-10-CM

## 2020-12-15 MED ORDER — AMLODIPINE BESYLATE 5 MG PO TABS: 5.0000 mg | ORAL_TABLET | Freq: Every day | ORAL | 3 refills | Status: DC

## 2020-12-15 MED ORDER — CYCLOBENZAPRINE HCL 10 MG PO TABS: 10.0000 mg | ORAL_TABLET | Freq: Every day | ORAL | 0 refills | Status: DC

## 2020-12-15 MED ORDER — HYDROCORTISONE (PERIANAL) 2.5 % EX CREA: 1.0000 "application " | TOPICAL_CREAM | Freq: Two times a day (BID) | CUTANEOUS | 5 refills | Status: DC

## 2020-12-15 NOTE — Patient Instructions (Addendum)
If you have lab work done today you will be contacted with your lab results within the next 2 weeks.  If you have not heard from [place] then please contact us. The fastest way to get your results is to register for My Chart.   IF you received an x-ray today, you will receive an invoice from [facility]. Please contact [facility] at [phone] with questions or concerns regarding your invoice.   IF you received labwork today, you will receive an invoice from [facility]. Please contact [facility] at [phone] with questions or concerns regarding your invoice.   Our billing staff will not be able to assist you with questions regarding bills from these companies.  You will be contacted with the lab results as soon as they are available. The fastest way to get your results is to activate your My Chart account. Instructions are located on the last page of this paperwork. If you have not heard from [place] regarding the results in 2 weeks, please contact this office.     Hemorroides Hemorrhoids Las hemorroides son venas inflamadas que pueden desarrollarse:  En el ano (recto). Estas se denominan hemorroides internas.  Alrededor de la abertura del ano. Estas se denominan hemorroides externas. Las hemorroides pueden causar dolor, picazn o hemorragias. Generalmente no causan problemas graves. Con frecuencia mejoran al [employer] dieta, el estilo de vida y otros tratamientos Financial planner. Cules son las causas? Esta afeccin puede ser causada por lo siguiente:  Tener dificultad para defecar (estreimiento).  Hacer mucha fuerza (esfuerzo) para defecar.  Materia fecal lquida (diarrea).  Embarazo.  Tener mucho sobrepeso (obesidad).  Estar sentado durante largos perodos de [place].  Levantar objetos pesados u otras actividades que impliquen esfuerzo.  Sexo anal.  Andar en bicicleta por un largo perodo de tiempo. Cules son los signos o los sntomas? Los sntomas de  esta afeccin incluyen los siguientes:  Social research officer, government.  Picazn o irritacin en el ano.  Sangrado proveniente del ano.  Prdida de materia fecal.  Inflamacin en la zona.  Uno o ms bultos alrededor de la abertura del ano. Cmo se diagnostica? A menudo un mdico puede diagnosticar esta afeccin al observar la zona afectada. El mdico tambin puede:  Optometrist un examen que implica palpar la zona con la mano enguantada (examen rectal digital).  Examinar el interior de la zona anal utilizando un pequeo tubo (anoscopio).  Pedir anlisis de [place]. Es posible que esto se realice si ha perdido Optometrist.  Solicitarle un estudio que consiste en la observacin del interior del colon utilizando un tubo flexible con una cmara en el extremo (sigmoidoscopia o colonoscopa). Cmo se trata? Esta afeccin generalmente se puede tratar en el hogar. El mdico puede indicarle que cambie de Designer, multimedia, de estilo de vida o que trate de Physicist, medical. Si esto no da resultado, se pueden realizar procedimientos para extirpar las hemorroides o reducir Publishing rights manager. Estos pueden implicar lo siguiente:  Building services engineer en la base de las hemorroides para interrumpir la irrigacin de Herbalist.  Inyectar un medicamento en las hemorroides para reducir Publishing rights manager.  Dirigir un tipo de Teacher, early years/pre de luz hacia las hemorroides para Field seismologist que se caigan.  Realizar Ardelia Mems ciruga para extirpar las hemorroides o cortar la irrigacin de [place]. Siga estas indicaciones en su casa: Comida y bebida   Consuma alimentos con alto contenido de [place]. Entre ellos cereales integrales, frijoles, frutos secos, frutas y verduras.  Pregntele a su mdico  acerca de tomar productos con fibra aadida en ellos (complementos defibra).  Disminuya la cantidad de grasa de la dieta. Para esto, puede hacer lo siguiente: ? Coma productos lcteos descremados. ? Coma menos carne roja. ? No consuma alimentos  procesados.  Beba suficiente lquido para Consulting civil engineer orina de color amarillo plido. Control del dolor y [place] un bao de agua tibia (bao de asiento) durante 20 minutos para Best boy. Hgalo 3 o 4veces al da. Puede hacer esto en una baera o usar un dispositivo porttil para bao de asiento que se coloca sobre el inodoro.  Si se lo indican, aplique hielo sobre la zona dolorida. Puede ser beneficioso aplicar hielo [employer] baos con agua tibia. ? Ponga el hielo en una bolsa plstica. ? Coloque una [employer] piel y Therapist, nutritional. ? Coloque el hielo durante 51minutos, 2 a 3veces por da. Indicaciones generales  [employer] de venta libre y los recetados solamente como se lo haya indicado el mdico. ? Las [employer] y los medicamentos pueden usarse como se lo hayan indicado.  Haga ejercicio fsico con frecuencia. Consulte al mdico qu tipos de ejercicios son mejores para usted y qu cantidad.  Vaya al bao cuando sienta ganas de defecar. No espere.  Evite hacer demasiada fuerza al defecar.  Mantenga el ano seco y limpio. Use papel higinico hmedo o toallitas humedecidas despus de defecar.  No pase mucho tiempo sentado en el inodoro.  Concurra a todas las visitas de seguimiento como se lo haya indicado el mdico. Esto es importante. Comunquese con un mdico si:  Tiene dolor e hinchazn que no mejoran con el tratamiento o los medicamentos.  Tiene problemas para defecar.  No puede defecar.  Tiene dolor o hinchazn en la zona exterior de las hemorroides. Solicite ayuda inmediatamente si tiene:  Hemorragia que no se detiene. Resumen  Las hemorroides son venas hinchadas en el ano o la zona que rodea el ano.  Pueden causar dolor, picazn o sangrado.  Consuma alimentos con alto contenido de [place]. Entre ellos cereales integrales, frijoles, frutos secos, frutas y verduras.  Tome un bao de agua tibia (bao de asiento) durante 20  minutos para Best boy. Hgalo 3 o 4veces al da. Esta informacin no tiene Marine scientist el consejo del mdico. Asegrese de hacerle al mdico cualquier pregunta que tenga. Document Revised: 06/21/2018 Document Reviewed: 06/21/2018 Elsevier Patient Education  [place].

## 2020-12-15 NOTE — Progress Notes (Signed)
[facility] 59 y.o.   Chief Complaint  Patient presents with  . Medication Refill    Amlodipine  . Hemorrhoids    Per patient for 3 weeks and use over the counter medication  . Headache    Per patient on the right and left side started yesterday    HISTORY OF PRESENT ILLNESS: This is a 59 y.o. male complaining of tender hemorrhoid over the past couple weeks.  No bleeding. Also complaining of intermittent right-sided neck pain.  No episode started last night.  Sharp steady discomfort to right side of the neck without any other associated symptoms or radiation of pain.  Denies injury.  Has had similar episodes in the past. Has history of cluster headaches. Also has history of hypertension needs medication refill of amlodipine 5 mg daily. No other complaints or medical concerns today Fully vaccinated against Covid with a booster.  HPI   Prior to Admission medications   Medication Sig Start Date End Date Taking? Authorizing Provider  albuterol (VENTOLIN HFA) 108 (90 Base) MCG/ACT inhaler Inhale 2 puffs into the lungs every 4 (four) hours as needed for wheezing or shortness of breath (coughing fits). 12/01/20  Yes Garnet Sierras, DO  amLODipine (NORVASC) 5 MG tablet TAKE 1 TABLET(5 MG) BY MOUTH DAILY 08/29/20  Yes Jacelyn Pi, Lilia Argue, MD  aspirin EC 325 MG tablet Take 325 mg by mouth as needed.   Yes [provider]  beclomethasone (QVAR) 80 MCG/ACT inhaler Inhale 2 puffs into the lungs 2 (two) times daily. 08/28/20  Yes Lauraine Rinne, NP  clobetasol (OLUX) 0.05 % topical foam APPLY EXTERNALLY TO THE AFFECTED AREA TWICE DAILY 03/31/20  Yes Jacelyn Pi, Irma M, MD  clobetasol ointment (TEMOVATE) 0.05 % APPLY EXTERNALLY TO THE AFFECTED AREA TWICE DAILY 04/17/20  Yes Jacelyn Pi, Lilia Argue, MD  EPINEPHrine (AUVI-Q) 0.3 mg/0.3 mL IJ SOAJ injection Inject 0.3 mg into the muscle as needed for anaphylaxis. 12/01/20  Yes Garnet Sierras, DO  fluocinolone (VANOS) 0.01 % cream Apply topically daily.  12/06/19  Yes Jacelyn Pi, Irma M, MD  fluocinonide cream (LIDEX) 0.05 % APPLY SPARINGLY EXTERNALLY TO THE AFFECTED AREA TWICE DAILY 02/02/20  Yes Jacelyn Pi, Irma M, MD  fluticasone [facility]) 110 MCG/ACT inhaler Inhale 2 puffs into the lungs 2 (two) times daily. 04/30/18  Yes [name], MD  gabapentin (NEURONTIN) 100 MG capsule Take 100 mg by mouth 3 (three) times daily.   Yes [provider]  hydrochlorothiazide (HYDRODIURIL) 12.5 MG tablet TAKE 1 TABLET BY MOUTH DAILY 07/25/20  Yes Jacelyn Pi, Lilia Argue, MD  ibuprofen (ADVIL) 600 MG tablet Take 1 tablet (600 mg total) by mouth every 6 (six) hours as needed. 06/03/20  Yes Melynda Ripple, MD  mometasone (NASONEX) 50 MCG/ACT nasal spray Place 1-2 sprays into the nose daily as needed (nasal symptoms). 12/01/20  Yes Garnet Sierras, DO  montelukast (SINGULAIR) 10 MG tablet TAKE 1 TABLET(10 MG) BY MOUTH AT BEDTIME 08/28/20  Yes Lauraine Rinne, NP  Multiple Vitamin (MULTIVITAMIN) capsule Take 1 capsule by mouth daily. OTC   Yes [provider]  OTEZLA 30 MG TABS Take 1 tablet by mouth 2 (two) times daily. 05/18/20  Yes [provider]  rosuvastatin (CRESTOR) 10 MG tablet TAKE 1 TABLET BY MOUTH DAILY 07/28/20  Yes Jacelyn Pi, Lilia Argue, MD  tiZANidine (ZANAFLEX) 4 MG tablet Take 1 tablet (4 mg total) by mouth every 8 (eight) hours as needed for muscle  spasms. 06/03/20  Yes Melynda Ripple, MD  gabapentin (NEURONTIN) 100 MG capsule Take 1 capsule (100 mg total) by mouth 3 (three) times daily for 7 days. 07/20/20 07/27/20  Konrad Felix, PA-C  hyoscyamine (LEVSIN SL) 0.125 MG SL tablet Place 1 tablet (0.125 mg total) under the tongue every 8 (eight) hours as needed. Patient not taking: Reported on 12/15/2020 08/20/19   Willia Craze, NP  ketoconazole (NIZORAL) 2 % shampoo APPLY EXTERNALLY 2 TIMES A WEEK. Patient not taking: Reported on 12/15/2020 11/25/17   [name], MD  Olopatadine HCl 0.2 % SOLN Apply 1 drop to eye  daily as needed (itchy/watery eyes). Patient not taking: Reported on 12/15/2020 12/01/20   Garnet Sierras, DO    No Known Allergies  Patient Active Problem List   Diagnosis Date Noted  . Moderate persistent asthma without complication 24/08/7352  . Prediabetes 04/02/2019  . Hyperlipidemia 12/23/2014  . Psoriasis 06/27/2014  . Multiple lung nodules 12/09/2011  . PULMONARY HYPERTENSION 03/04/2010  . Essential hypertension 02/10/2010  . Other allergic rhinitis 02/10/2010  . Asthma in adult 02/10/2010  . OBSTRUCTIVE SLEEP APNEA 02/04/2010    Past Medical History:  Diagnosis Date  . Allergy    Flonase, Patanol, Zyrtec  . Arthritis    psoriasis  . Asthma    seasonal, with allergies  . Borderline diabetes   . Cluster headache   . Colon polyp 10/27/2011  . Depression    denies 11/11/16; denies 11/21/18 "been years"  . GERD (gastroesophageal reflux disease)   . History of kidney stones    x1  . Hyperlipidemia   . Hypertension   . Low back pain    "in kidney area-was told that it was related to gallstones"  . Prediabetes   . Psoriasis    [name]  . Severe sleep apnea    h/o; had procedure and weight control, does not have to wear CPAP  . 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 APPENDECTOMY    . CHOLECYSTECTOMY N/A 12/16/2014   Procedure: LAPAROSCOPIC CHOLECYSTECTOMY WITH INTRAOPERATIVE CHOLANGIOGRAM;  Surgeon: [name], MD;  Location: [facility];  Service: General;  Laterality: N/A;  . COLONOSCOPY  10/27/2011   single polyp. Repeat 5 years.  Marland Kitchen POLYPECTOMY    . PROSTATE BIOPSY  10/2014   will have another in 3 months  . SEPTOPLASTY  2014  . TONSILLECTOMY  2014  . UVULOPALATOPHARYNGOPLASTY  2014    Social History   Socioeconomic History  . Marital status: Divorced    Spouse name: Not on file  . Number of children: 1  . Years of education: Not on file  . Highest education  level: Doctorate  Occupational History  . Occupation: Product manager: [employer]    Comment: Pharmacist, community  Tobacco Use  . Smoking status: Never Smoker  . Smokeless tobacco: Never Used  Vaping Use  . Vaping Use: Never used  Substance and Sexual Activity  . Alcohol use: Never    Alcohol/week: 0.0 standard drinks  . Drug use: Never  . Sexual activity: Yes    Comment: SSP  Other Topics Concern  . Not on file  Social History Narrative   Original from [place]; moved to [place] 1995.   Marital status: same sexual partner 4 X years.   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        Tobacco: never      Alcohol: none      Drugs: none       Exercise: daily in 2018; 10,000 steps daily; lots of walking on campus at work      ADLs: independent with ADLs.        Sexual activity: dating males only in 2018; bisexual.  Previously married to male.       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         Social Determinants of Health   Financial Resource Strain: Not on file  Food Insecurity: Not on file  Transportation Needs: Not on file  Physical Activity: Not on file  Stress: Not on file  Social Connections: Not on file  Intimate Partner Violence: Not on file    Family History  Problem Relation Age of Onset  . Stroke Mother   . Asthma Sister   . Colon cancer Neg Hx   . Esophageal cancer Neg Hx   . Stomach cancer Neg Hx   . Prostate cancer Neg Hx   . Diabetes Neg Hx   . CAD Neg Hx   . Migraines Neg Hx   . Headache Neg Hx      Review of  Systems  Constitutional: Negative.  Negative for chills and fever.  HENT: Negative.  Negative for congestion and sore throat.   Respiratory: Negative.  Negative for cough and shortness of breath.   Cardiovascular: Negative.  Negative for chest pain and palpitations.  Gastrointestinal: Negative.  Negative for abdominal pain, blood in stool, diarrhea, melena, nausea and vomiting.  Genitourinary: Negative.  Negative for dysuria and hematuria.  Musculoskeletal: Positive for neck pain. Negative for back pain.  Skin: Negative.  Negative for rash.  Neurological: Positive for headaches (History of cluster headaches).  All other systems reviewed and are negative.   Today's Vitals   12/15/20 1004  BP: 121/79  Pulse: 61  Resp: 16  Temp: 98.2 F (36.8 C)  TempSrc: Temporal  SpO2: 98%  Weight: 171 lb (77.6 kg)  Height: 5\' 6"  (1.676 m)   Body mass index is 27.6 kg/m.  Physical Exam Vitals reviewed.  Constitutional:      Appearance: Normal appearance.  HENT:     Head: Normocephalic.     Mouth/Throat:     Mouth: Mucous membranes are moist.     Pharynx: Oropharynx is clear.  Eyes:     Extraocular Movements: Extraocular movements intact.     Conjunctiva/sclera: Conjunctivae normal.     Pupils: Pupils are equal, round, and reactive to light.  Cardiovascular:     Rate and Rhythm: Normal rate and regular rhythm.     Pulses: Normal pulses.     Heart sounds: Normal heart sounds.  Pulmonary:     Effort: Pulmonary effort is normal.     Breath sounds: Normal breath sounds.  Genitourinary:    Rectum: External hemorrhoid present. No anal fissure.  Musculoskeletal:        General: Normal range of motion.     Cervical back: Normal range of motion and neck supple. Muscular  tenderness (Right-sided) present.  Lymphadenopathy:     Cervical: No cervical adenopathy.  Skin:    General: Skin is warm and dry.     Capillary Refill: Capillary refill takes less than 2 seconds.  Neurological:      General: No focal deficit present.     Mental Status: He is alert and oriented to person, place, and time.  Psychiatric:        Mood and Affect: Mood normal.        Behavior: Behavior normal.   A total of 30 minutes was spent with the patient, greater than 50% of which was in counseling/coordination of care regarding establishing care with me, review of several chronic medical problems, diagnosis and treatment of hemorrhoids, health maintenance items, education and nutrition, review of most recent office visit notes, review of most recent blood work results, prognosis, documentation, and need for follow-up.    ASSESSMENT & PLAN: Augusto was seen today for medication refill, hemorrhoids and headache.  Diagnoses and all orders for this visit:  External hemorrhoid -     hydrocortisone (ANUSOL-HC) 2.5 % rectal cream; Place 1 application rectally 2 (two) times daily.  Essential hypertension -     amLODipine (NORVASC) 5 MG tablet; Take 1 tablet (5 mg total) by mouth daily.  Pulmonary hypertension (HCC)  Obstructive sleep apnea  Prediabetes  Psoriasis  History of gout  Musculoskeletal neck pain -     cyclobenzaprine (FLEXERIL) 10 MG tablet; Take 1 tablet (10 mg total) by mouth at bedtime.    Patient Instructions       If you have lab work done today you will be contacted with your lab results within the next 2 weeks.  If you have not heard from [place] then please contact us. The fastest way to get your results is to register for My Chart.   IF you received an x-ray today, you will receive an invoice from [facility]. Please contact [facility] at [phone] with questions or concerns regarding your invoice.   IF you received labwork today, you will receive an invoice from [facility]. Please contact [facility] at [phone] with questions or concerns regarding your invoice.   Our billing staff will not be able to assist you with questions regarding bills from  these companies.  You will be contacted with the lab results as soon as they are available. The fastest way to get your results is to activate your My Chart account. Instructions are located on the last page of this paperwork. If you have not heard from [place] regarding the results in 2 weeks, please contact this office.     Hemorroides Hemorrhoids Las hemorroides son venas inflamadas que pueden desarrollarse:  En el ano (recto). Estas se denominan hemorroides internas.  Alrededor de la abertura del ano. Estas se denominan hemorroides externas. Las hemorroides pueden causar dolor, picazn o hemorragias. Generalmente no causan problemas graves. Con frecuencia mejoran al [employer] dieta, el estilo de vida y otros tratamientos Financial planner. Cules son las causas? Esta afeccin puede ser causada por lo siguiente:  Tener dificultad para defecar (estreimiento).  Hacer mucha fuerza (esfuerzo) para defecar.  Materia fecal lquida (diarrea).  Embarazo.  Tener mucho sobrepeso (obesidad).  Estar sentado durante largos perodos de [place].  Levantar objetos pesados u otras actividades que impliquen esfuerzo.  Sexo anal.  Andar en bicicleta por un largo perodo de tiempo. Cules son los signos o los sntomas? Los sntomas de esta afeccin incluyen los siguientes:  Social research officer, government.  Picazn o irritacin en el ano.  Sangrado proveniente del ano.  Prdida de materia fecal.  Inflamacin en la zona.  Uno o ms bultos alrededor de la abertura del ano. Cmo se diagnostica? A menudo un mdico puede diagnosticar esta afeccin al observar la zona afectada. El mdico tambin puede:  Optometrist un examen que implica palpar la zona con la mano enguantada (examen rectal digital).  Examinar el interior de la zona anal utilizando un pequeo tubo (anoscopio).  Pedir anlisis de [place]. Es posible que esto se realice si ha perdido Optometrist.  Solicitarle un estudio que consiste en la observacin del  interior del colon utilizando un tubo flexible con una cmara en el extremo (sigmoidoscopia o colonoscopa). Cmo se trata? Esta afeccin generalmente se puede tratar en el hogar. El mdico puede indicarle que cambie de Designer, multimedia, de estilo de vida o que trate de Physicist, medical. Si esto no da resultado, se pueden realizar procedimientos para extirpar las hemorroides o reducir Publishing rights manager. Estos pueden implicar lo siguiente:  Building services engineer en la base de las hemorroides para interrumpir la irrigacin de Herbalist.  Inyectar un medicamento en las hemorroides para reducir Publishing rights manager.  Dirigir un tipo de Teacher, early years/pre de luz hacia las hemorroides para Field seismologist que se caigan.  Realizar Ardelia Mems ciruga para extirpar las hemorroides o cortar la irrigacin de [place]. Siga estas indicaciones en su casa: Comida y bebida   Consuma alimentos con alto contenido de [place]. Entre ellos cereales integrales, frijoles, frutos secos, frutas y verduras.  Pregntele a su mdico acerca de tomar productos con fibra aadida en ellos (complementos defibra).  Disminuya la cantidad de grasa de la dieta. Para esto, puede hacer lo siguiente: ? Coma productos lcteos descremados. ? Coma menos carne roja. ? No consuma alimentos procesados.  Beba suficiente lquido para Consulting civil engineer orina de color amarillo plido. Control del dolor y [place] un bao de agua tibia (bao de asiento) durante 20 minutos para Best boy. Hgalo 3 o 4veces al da. Puede hacer esto en una baera o usar un dispositivo porttil para bao de asiento que se coloca sobre el inodoro.  Si se lo indican, aplique hielo sobre la zona dolorida. Puede ser beneficioso aplicar hielo [employer] baos con agua tibia. ? Ponga el hielo en una bolsa plstica. ? Coloque una [employer] piel y Therapist, nutritional. ? Coloque el hielo durante 53minutos, 2 a 3veces por da. Indicaciones generales  [employer] de venta  libre y los recetados solamente como se lo haya indicado el mdico. ? Las [employer] y los medicamentos pueden usarse como se lo hayan indicado.  Haga ejercicio fsico con frecuencia. Consulte al mdico qu tipos de ejercicios son mejores para usted y qu cantidad.  Vaya al bao cuando sienta ganas de defecar. No espere.  Evite hacer demasiada fuerza al defecar.  Mantenga el ano seco y limpio. Use papel higinico hmedo o toallitas humedecidas despus de defecar.  No pase mucho tiempo sentado en el inodoro.  Concurra a todas las visitas de seguimiento como se lo haya indicado el mdico. Esto es importante. Comunquese con un mdico si:  Tiene dolor e hinchazn que no mejoran con el tratamiento o los medicamentos.  Tiene problemas para defecar.  No puede defecar.  Tiene dolor o hinchazn en la zona exterior de las hemorroides. Solicite ayuda inmediatamente si tiene:  Hemorragia que no se detiene. Resumen  Las hemorroides son venas hinchadas  en el ano o la zona que rodea el ano.  Pueden causar dolor, picazn o sangrado.  Consuma alimentos con alto contenido de [place]. Entre ellos cereales integrales, frijoles, frutos secos, frutas y verduras.  Tome un bao de agua tibia (bao de asiento) durante 20 minutos para Best boy. Hgalo 3 o 4veces al da. Esta informacin no tiene Marine scientist el consejo del mdico. Asegrese de hacerle al mdico cualquier pregunta que tenga. Document Revised: 06/21/2018 Document Reviewed: 06/21/2018 Elsevier Patient Education  2020 Elsevier Inc.      Agustina Caroli, MD [facility]

## 2020-12-24 ENCOUNTER — Ambulatory Visit (INDEPENDENT_AMBULATORY_CARE_PROVIDER_SITE_OTHER): Payer: BC Managed Care – PPO | Admitting: *Deleted

## 2020-12-24 ENCOUNTER — Telehealth: Payer: Self-pay | Admitting: *Deleted

## 2020-12-24 DIAGNOSIS — J309 Allergic rhinitis, unspecified: Secondary | ICD-10-CM | POA: Diagnosis not present

## 2020-12-24 NOTE — Progress Notes (Signed)
Immunotherapy   Patient Details  Name: Rickey Cruz MRN: [id number] Date of Birth: [birth year]  12/24/2020  Rickey Cruz started injections for  G-T, M-DM Following schedule: B  Frequency:1 time per week Epi-Pen:Epi-Pen Available  Consent signed and patient instructions given. Patient started allergy injections today and received 0.45mL of G-T in the RUA and 0.43mL of M-DM in the LUA. Patient waited 30 minutes and did not experience any issues.   Rickey Cruz 12/24/2020, 6:02 PM

## 2020-12-24 NOTE — Telephone Encounter (Signed)
Patient came in and started his allergy injections today. He was curious and asked if there was an option to do oral immunotherapy for allergies and wanted me to ask you. Please advise.

## 2020-12-27 NOTE — Telephone Encounter (Signed)
Please call patient. We don't offer sublingual drops in our office.   There are oral pills for dust mites, some grasses and ragweed however he is allergic to trees and mold as well. There are no pills for that.   Given how many things he is allergic to, the injections would be the best option for him.

## 2020-12-28 NOTE — Telephone Encounter (Signed)
Left message for patient to call back to discuss immunotherapy

## 2020-12-30 NOTE — Telephone Encounter (Signed)
Called patient and left a detailed voicemail advising per [facility] permission.

## 2021-01-01 ENCOUNTER — Ambulatory Visit (INDEPENDENT_AMBULATORY_CARE_PROVIDER_SITE_OTHER): Payer: BC Managed Care – PPO

## 2021-01-01 DIAGNOSIS — J309 Allergic rhinitis, unspecified: Secondary | ICD-10-CM

## 2021-01-07 ENCOUNTER — Ambulatory Visit (INDEPENDENT_AMBULATORY_CARE_PROVIDER_SITE_OTHER): Payer: BC Managed Care – PPO | Admitting: *Deleted

## 2021-01-07 DIAGNOSIS — J309 Allergic rhinitis, unspecified: Secondary | ICD-10-CM

## 2021-01-12 ENCOUNTER — Ambulatory Visit: Payer: BC Managed Care – PPO | Admitting: Emergency Medicine

## 2021-01-12 ENCOUNTER — Encounter: Payer: Self-pay | Admitting: Emergency Medicine

## 2021-01-12 ENCOUNTER — Other Ambulatory Visit: Payer: Self-pay

## 2021-01-12 VITALS — BP 130/86 | HR 86 | Temp 98.2°F | Ht 66.0 in | Wt 178.8 lb

## 2021-01-12 DIAGNOSIS — R7303 Prediabetes: Secondary | ICD-10-CM | POA: Diagnosis not present

## 2021-01-12 DIAGNOSIS — Z7689 Persons encountering health services in other specified circumstances: Secondary | ICD-10-CM

## 2021-01-12 DIAGNOSIS — I10 Essential (primary) hypertension: Secondary | ICD-10-CM | POA: Diagnosis not present

## 2021-01-12 DIAGNOSIS — G4733 Obstructive sleep apnea (adult) (pediatric): Secondary | ICD-10-CM

## 2021-01-12 DIAGNOSIS — I272 Pulmonary hypertension, unspecified: Secondary | ICD-10-CM

## 2021-01-12 MED ORDER — LISINOPRIL 20 MG PO TABS: 20.0000 mg | ORAL_TABLET | Freq: Every day | ORAL | 3 refills | Status: DC

## 2021-01-12 NOTE — Patient Instructions (Addendum)
   If you have lab work done today you will be contacted with your lab results within the next 2 weeks.  If you have not heard from us then please contact us. The fastest way to get your results is to register for My Chart.   IF you received an x-ray today, you will receive an invoice from [facility]. Please contact  Radiology at [phone] with questions or concerns regarding your invoice.   IF you received labwork today, you will receive an invoice from [facility]. Please contact [facility] at [phone] with questions or concerns regarding your invoice.   Our billing staff will not be able to assist you with questions regarding bills from these companies.  You will be contacted with the lab results as soon as they are available. The fastest way to get your results is to activate your My Chart account. Instructions are located on the last page of this paperwork. If you have not heard from us regarding the results in 2 weeks, please contact this office.      Mantenimiento de la salud en los hombres Health Maintenance, Male Adoptar un estilo de vida saludable y recibir atencin preventiva son importantes para promover la salud y el bienestar. Consulte al mdico sobre:  El esquema adecuado para hacerse pruebas y exmenes peridicos.  Cosas que puede hacer por su cuenta para prevenir enfermedades y mantenerse sano. Qu debo saber sobre la dieta, el peso y el ejercicio? Consuma una dieta saludable  Consuma una dieta que incluya muchas verduras, frutas, productos lcteos con bajo contenido de grasa y protenas magras.  No consuma muchos alimentos ricos en grasas slidas, azcares agregados o sodio.   Mantenga un peso saludable El ndice de masa muscular (IMC) es una medida que puede utilizarse para identificar posibles problemas de peso. Proporciona una estimacin de la grasa corporal basndose en el peso y la altura. Su mdico puede ayudarle a determinar su IMC y a  lograr o mantener un peso saludable. Haga ejercicio con regularidad Haga ejercicio con regularidad. Esta es una de las prcticas ms importantes que puede hacer por su salud. La mayora de los adultos deben seguir estas pautas:  Realizar, al menos, 150minutos de actividad fsica por semana. El ejercicio debe aumentar la frecuencia cardaca y hacerlo transpirar (ejercicio de intensidad moderada).  Hacer ejercicios de fortalecimiento por lo menos dos veces por semana. Agregue esto a su plan de ejercicio de intensidad moderada.  Pasar menos tiempo sentados. Incluso la actividad fsica ligera puede ser beneficiosa. Controle sus niveles de colesterol y lpidos en la sangre Comience a realizarse anlisis de lpidos y colesterol en la sangre a los 20aos y luego reptalos cada 5aos. Es posible que necesite controlar los niveles de colesterol con mayor frecuencia si:  Sus niveles de lpidos y colesterol son altos.  Es mayor de 40aos.  Presenta un alto riesgo de padecer enfermedades cardacas. Qu debo saber sobre las pruebas de deteccin del cncer? Muchos tipos de cncer pueden detectarse de manera temprana y, a menudo, pueden prevenirse. Segn su historia clnica y sus antecedentes familiares, es posible que deba realizarse pruebas de deteccin del cncer en diferentes edades. Esto puede incluir pruebas de deteccin de lo siguiente:  Cncer colorrectal.  Cncer de prstata.  Cncer de piel.  Cncer de pulmn. Qu debo saber sobre la enfermedad cardaca, la diabetes y la hipertensin arterial? Presin arterial y enfermedad cardaca  La hipertensin arterial causa enfermedades cardacas y aumenta el riesgo de accidente cerebrovascular. Es ms   probable que esto se manifieste en las personas que tienen lecturas de presin arterial alta, tienen ascendencia africana o tienen sobrepeso.  Hable con el mdico sobre sus valores de presin arterial deseados.  Hgase controlar la presin  arterial: ? Cada 3 a 5 aos si tiene entre 18 y 39 aos. ? Todos los aos si es mayor de 40aos.  Si tiene entre 65 y 75 aos y es fumador o sola fumar, pregntele al mdico si debe realizarse una prueba de deteccin de aneurisma artico abdominal (AAA) por nica vez. Diabetes Realcese exmenes de deteccin de la diabetes con regularidad. Este anlisis revisa el nivel de azcar en la sangre en ayunas. Hgase las pruebas de deteccin:  Cada tresaos despus de los 45aos de edad si tiene un peso normal y un bajo riesgo de padecer diabetes.  Con ms frecuencia y a partir de una edad inferior si tiene sobrepeso o un alto riesgo de padecer diabetes. Qu debo saber sobre la prevencin de infecciones? Hepatitis B Si tiene un riesgo ms alto de contraer hepatitis B, debe someterse a un examen de deteccin de este virus. Hable con el mdico para averiguar si tiene riesgo de contraer la infeccin por hepatitis B. Hepatitis C Se recomienda un anlisis de sangre para:  Todos los que nacieron entre 1945 y 1965.  Todas las personas que tengan un riesgo de haber contrado hepatitis C. Enfermedades de transmisin sexual (ETS)  Debe realizarse pruebas de deteccin de ITS todos los aos, incluidas la gonorrea y la clamidia, si: ? Es sexualmente activo y es menor de 24aos. ? Es mayor de 24aos, y el mdico le informa que corre riesgo de tener este tipo de infecciones. ? La actividad sexual ha cambiado desde que le hicieron la ltima prueba de deteccin y tiene un riesgo mayor de tener clamidia o gonorrea. Pregntele al mdico si usted tiene riesgo.  Pregntele al mdico si usted tiene un alto riesgo de contraer VIH. El mdico tambin puede recomendarle un medicamento recetado para ayudar a evitar la infeccin por el VIH. Si elige tomar medicamentos para prevenir el VIH, primero debe hacerse los anlisis de deteccin del VIH. Luego debe hacerse anlisis cada 3meses mientras est tomando los  medicamentos. Siga estas instrucciones en su casa: Estilo de vida  No consuma ningn producto que contenga nicotina o tabaco, como cigarrillos, cigarrillos electrnicos y tabaco de mascar. Si necesita ayuda para dejar de fumar, consulte al mdico.  No consuma drogas.  No comparta agujas.  Solicite ayuda a su mdico si necesita apoyo o informacin para abandonar las drogas. Consumo de alcohol  No beba alcohol si el mdico se lo prohbe.  Si bebe alcohol: ? Limite la cantidad que consume de 0 a 2 medidas por da. ? Est atento a la cantidad de alcohol que hay en las bebidas que toma. En [place], una medida equivale a una botella de cerveza de 12oz (355ml), un vaso de vino de 5oz (148ml) o un vaso de una bebida alcohlica de alta graduacin de 1oz (44ml). Instrucciones generales  Realcese los estudios de rutina de la salud, dentales y de la vista.  Mantngase al da con las vacunas.  Infrmele a su mdico si: ? Se siente deprimido con frecuencia. ? Alguna vez ha sido vctima de maltrato o no se siente seguro en su casa. Resumen  Adoptar un estilo de vida saludable y recibir atencin preventiva son importantes para promover la salud y el bienestar.  Siga las instrucciones del mdico   acerca de una dieta saludable, el ejercicio y la realizacin de pruebas o exmenes para detectar enfermedades.  Siga las instrucciones del mdico con respecto al control del colesterol y la presin arterial. Esta informacin no tiene como fin reemplazar el consejo del mdico. Asegrese de hacerle al mdico cualquier pregunta que tenga. Document Revised: 01/02/2019 Document Reviewed: 01/02/2019 [employer] Patient Education  2021 [employer]  

## 2021-01-12 NOTE — Progress Notes (Signed)
[facility] 60 y.o.   Chief Complaint  Patient presents with  . Transitions Of Care    F/u from dec    HISTORY OF PRESENT ILLNESS: This is a 60 y.o. male here for transition of care.  Used to see Dr. Pamella Pert. Has history of hypertension on amlodipine 5 mg daily.  Requesting change. Has history of hemorrhoids seen by me recently.  Doing well and much better. Non-smoker. Up-to-date with colonoscopy. Healthy lifestyle. Has no complaints or medical concerns today.  HPI   Prior to Admission medications   Medication Sig Start Date End Date Taking? Authorizing Provider  albuterol (VENTOLIN HFA) 108 (90 Base) MCG/ACT inhaler Inhale 2 puffs into the lungs every 4 (four) hours as needed for wheezing or shortness of breath (coughing fits). 12/01/20  Yes Garnet Sierras, DO  amLODipine (NORVASC) 5 MG tablet Take 1 tablet (5 mg total) by mouth daily. 12/15/20  Yes SagardiaInes Bloomer, MD  aspirin EC 325 MG tablet Take 325 mg by mouth as needed.   Yes [provider]  beclomethasone (QVAR) 80 MCG/ACT inhaler Inhale 2 puffs into the lungs 2 (two) times daily. 08/28/20  Yes Lauraine Rinne, NP  clobetasol (OLUX) 0.05 % topical foam APPLY EXTERNALLY TO THE AFFECTED AREA TWICE DAILY 03/31/20  Yes Jacelyn Pi, Irma M, MD  clobetasol ointment (TEMOVATE) 0.05 % APPLY EXTERNALLY TO THE AFFECTED AREA TWICE DAILY 04/17/20  Yes Jacelyn Pi, Lilia Argue, MD  cyclobenzaprine (FLEXERIL) 10 MG tablet Take 1 tablet (10 mg total) by mouth at bedtime. 12/15/20  Yes Brendon Christoffel, Ines Bloomer, MD  EPINEPHrine (AUVI-Q) 0.3 mg/0.3 mL IJ SOAJ injection Inject 0.3 mg into the muscle as needed for anaphylaxis. 12/01/20  Yes Garnet Sierras, DO  fluocinolone (VANOS) 0.01 % cream Apply topically daily. 12/06/19  Yes Jacelyn Pi, Irma M, MD  fluocinonide cream (LIDEX) 0.05 % APPLY SPARINGLY EXTERNALLY TO THE AFFECTED AREA TWICE DAILY 02/02/20  Yes Jacelyn Pi, Irma M, MD  fluticasone [facility]) 110 MCG/ACT inhaler Inhale 2 puffs  into the lungs 2 (two) times daily. 04/30/18  Yes [name], MD  gabapentin (NEURONTIN) 100 MG capsule Take 100 mg by mouth 3 (three) times daily.   Yes [provider]  hydrochlorothiazide (HYDRODIURIL) 12.5 MG tablet TAKE 1 TABLET BY MOUTH DAILY 07/25/20  Yes Jacelyn Pi, Lilia Argue, MD  hydrocortisone (ANUSOL-HC) 2.5 % rectal cream Place 1 application rectally 2 (two) times daily. 12/15/20  Yes Nikoleta Dady, Ines Bloomer, MD  hyoscyamine (LEVSIN SL) 0.125 MG SL tablet Place 1 tablet (0.125 mg total) under the tongue every 8 (eight) hours as needed. 08/20/19  Yes Willia Craze, NP  ibuprofen (ADVIL) 600 MG tablet Take 1 tablet (600 mg total) by mouth every 6 (six) hours as needed. 06/03/20  Yes Melynda Ripple, MD  ketoconazole (NIZORAL) 2 % shampoo APPLY EXTERNALLY 2 TIMES A WEEK. 11/25/17  Yes [name], MD  lisinopril (ZESTRIL) 20 MG tablet Take 1 tablet (20 mg total) by mouth daily. 01/12/21  Yes Jencarlos Nicolson, Ines Bloomer, MD  mometasone (NASONEX) 50 MCG/ACT nasal spray Place 1-2 sprays into the nose daily as needed (nasal symptoms). 12/01/20  Yes Garnet Sierras, DO  montelukast (SINGULAIR) 10 MG tablet TAKE 1 TABLET(10 MG) BY MOUTH AT BEDTIME 08/28/20  Yes Lauraine Rinne, NP  Olopatadine HCl 0.2 % SOLN Apply 1 drop to eye daily as needed (itchy/watery eyes). 12/01/20  Yes Garnet Sierras, DO  OTEZLA 30 MG TABS Take 1  tablet by mouth 2 (two) times daily. 05/18/20  Yes [provider]  rosuvastatin (CRESTOR) 10 MG tablet TAKE 1 TABLET BY MOUTH DAILY 07/28/20  Yes Jacelyn Pi, Lilia Argue, MD  tiZANidine (ZANAFLEX) 4 MG tablet Take 1 tablet (4 mg total) by mouth every 8 (eight) hours as needed for muscle spasms. 06/03/20  Yes Melynda Ripple, MD    No Known Allergies  Patient Active Problem List   Diagnosis Date Noted  . Moderate persistent asthma without complication 21/19/4174  . Prediabetes 04/02/2019  . Hyperlipidemia 12/23/2014  . Psoriasis 06/27/2014  . Multiple lung nodules  12/09/2011  . PULMONARY HYPERTENSION 03/04/2010  . Essential hypertension 02/10/2010  . Other allergic rhinitis 02/10/2010  . Asthma in adult 02/10/2010  . OBSTRUCTIVE SLEEP APNEA 02/04/2010    Past Medical History:  Diagnosis Date  . Allergy    Flonase, Patanol, Zyrtec  . Arthritis    psoriasis  . Asthma    seasonal, with allergies  . Borderline diabetes   . Cluster headache   . Colon polyp 10/27/2011  . Depression    denies 11/11/16; denies 11/21/18 "been years"  . GERD (gastroesophageal reflux disease)   . History of kidney stones    x1  . Hyperlipidemia   . Hypertension   . Low back pain    "in kidney area-was told that it was related to gallstones"  . Prediabetes   . Psoriasis    [name]  . Severe sleep apnea    h/o; had procedure and weight control, does not have to wear CPAP  . 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 APPENDECTOMY    . CHOLECYSTECTOMY N/A 12/16/2014   Procedure: LAPAROSCOPIC CHOLECYSTECTOMY WITH INTRAOPERATIVE CHOLANGIOGRAM;  Surgeon: [name], MD;  Location: [facility];  Service: General;  Laterality: N/A;  . COLONOSCOPY  10/27/2011   single polyp. Repeat 5 years.  Marland Kitchen POLYPECTOMY    . PROSTATE BIOPSY  10/2014   will have another in 3 months  . SEPTOPLASTY  2014  . TONSILLECTOMY  2014  . UVULOPALATOPHARYNGOPLASTY  2014    Social History   Socioeconomic History  . Marital status: Divorced    Spouse name: Not on file  . Number of children: 1  . Years of education: Not on file  . Highest education level: Doctorate  Occupational History  . Occupation: Product manager: [employer]    Comment: Pharmacist, community  Tobacco Use  . Smoking status: Never Smoker  . Smokeless tobacco: Never Used  Vaping Use  . Vaping Use: Never used  Substance and Sexual Activity  . Alcohol use: Never    Alcohol/week: 0.0 standard drinks  . Drug use:  Never  . Sexual activity: Yes    Comment: SSP  Other Topics Concern  . Not on file  Social History Narrative   Original from [place]; moved to [place] 1995.   Marital status: same sexual partner 4 X years.   Children:  1 child/son (18) at 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        Tobacco: never      Alcohol: none  Drugs: none       Exercise: daily in 2018; 10,000 steps daily; lots of walking on campus at work      ADLs: independent with ADLs.        Sexual activity: dating males only in 2018; bisexual.  Previously married to male.       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         Social Determinants of Health   Financial Resource Strain: Not on file  Food Insecurity: Not on file  Transportation Needs: Not on file  Physical Activity: Not on file  Stress: Not on file  Social Connections: Not on file  Intimate Partner Violence: Not on file    Family History  Problem Relation Age of Onset  . Stroke Mother   . Asthma Sister   . Colon cancer Neg Hx   . Esophageal cancer Neg Hx   . Stomach cancer Neg Hx   . Prostate cancer Neg Hx   . Diabetes Neg Hx   . CAD Neg Hx   . Migraines Neg Hx   . Headache Neg Hx      Review of Systems  Constitutional: Negative.  Negative for chills and fever.  HENT: Negative.  Negative for congestion and sore throat.   Respiratory: Negative.  Negative for cough and shortness of breath.   Cardiovascular: Negative.  Negative for chest pain and palpitations.  Gastrointestinal: Negative.  Negative for abdominal pain, diarrhea, nausea and  vomiting.  Genitourinary: Negative.  Negative for dysuria and hematuria.  Musculoskeletal: Negative.  Negative for back pain, myalgias and neck pain.  Skin: Negative.  Negative for rash.  Neurological: Negative.  Negative for dizziness and headaches.  All other systems reviewed and are negative.  Today's Vitals   01/12/21 1520  BP: 130/86  Pulse: 86  Temp: 98.2 F (36.8 C)  TempSrc: Temporal  SpO2: 97%  Weight: 178 lb 12.8 oz (81.1 kg)  Height: 5\' 6"  (1.676 m)   Body mass index is 28.86 kg/m.   Physical Exam Vitals reviewed.  Constitutional:      Appearance: Normal appearance.  HENT:     Head: Normocephalic.  Eyes:     Extraocular Movements: Extraocular movements intact.     Conjunctiva/sclera: Conjunctivae normal.     Pupils: Pupils are equal, round, and reactive to light.  Cardiovascular:     Rate and Rhythm: Normal rate and regular rhythm.     Pulses: Normal pulses.  Pulmonary:     Effort: Pulmonary effort is normal.     Breath sounds: Normal breath sounds.  Musculoskeletal:     Cervical back: Normal range of motion.  Skin:    General: Skin is warm and dry.     Capillary Refill: Capillary refill takes less than 2 seconds.  Neurological:     General: No focal deficit present.     Mental Status: He is alert and oriented to person, place, and time.  Psychiatric:        Mood and Affect: Mood normal.        Behavior: Behavior normal.    A total of 30 minutes was spent with the patient, greater than 50% of which was in counseling/coordination of care regarding hypertension and prediabetes and cardiovascular risks associated with these conditions, review of all medications, education on nutrition, review of most recent blood work results, review of most recent office  visit notes, health maintenance items, establishing care with me and expectations, prognosis, documentation and need for follow-up in 6 months.   ASSESSMENT & PLAN: Enson was seen today for transitions  of care.  Diagnoses and all orders for this visit:  Essential hypertension -     lisinopril (ZESTRIL) 20 MG tablet; Take 1 tablet (20 mg total) by mouth daily.  Encounter to establish care  Prediabetes  Obstructive sleep apnea  Pulmonary hypertension ([place])    Patient Instructions       If you have lab work done today you will be contacted with your lab results within the next 2 weeks.  If you have not heard from [place] then please contact us. The fastest way to get your results is to register for My Chart.   IF you received an x-ray today, you will receive an invoice from [facility] Radiology. Please contact [facility] at [phone] with questions or concerns regarding your invoice.   IF you received labwork today, you will receive an invoice from [facility]. Please contact [facility] at [phone] with questions or concerns regarding your invoice.   Our billing staff will not be able to assist you with questions regarding bills from these companies.  You will be contacted with the lab results as soon as they are available. The fastest way to get your results is to activate your My Chart account. Instructions are located on the last page of this paperwork. If you have not heard from [place] regarding the results in 2 weeks, please contact this office.      Mantenimiento de Teacher, English as a foreign language, Male Adoptar un estilo de vida saludable y recibir atencin preventiva son importantes para promover la salud y Musician. Consulte al mdico sobre:  El esquema adecuado para hacerse pruebas y exmenes peridicos.  Cosas que puede hacer por su cuenta para prevenir enfermedades y [place] sano. Qu debo saber sobre la dieta, el peso y el ejercicio? Consuma una dieta saludable  Consuma una dieta que incluya muchas verduras, frutas, productos lcteos con bajo contenido de [place] y Advertising account planner.  No consuma muchos alimentos ricos en grasas slidas,  azcares agregados o sodio.   Mantenga un peso saludable El ndice de masa muscular [facility]) es una medida que puede utilizarse para identificar posibles problemas de [place]. Proporciona una estimacin de la grasa corporal basndose en el peso y la altura. Su mdico puede ayudarle a Radiation protection practitioner [place] y a Scientist, forensic o Theatre manager un peso saludable. Haga ejercicio con regularidad Haga ejercicio con regularidad. Esta es una de las prcticas ms importantes que puede hacer por su salud. La mayora de los adultos deben seguir estas pautas:  Optometrist, al menos, 17minutos de actividad fsica por semana. El ejercicio debe aumentar la frecuencia cardaca y Nature conservation officer transpirar (ejercicio de intensidad moderada).  Hacer ejercicios de fortalecimiento por lo [employer] por semana. Agregue esto a su plan de ejercicio de intensidad moderada.  Pasar menos tiempo sentados. Incluso la actividad fsica ligera puede ser beneficiosa. Controle sus niveles de colesterol y lpidos en la sangre Comience a realizarse anlisis de lpidos y Research officer, trade union en la sangre a los 20aos y luego reptalos cada 5aos. Es posible que Automotive engineer los niveles de colesterol con mayor frecuencia si:  Sus niveles de lpidos y colesterol son altos.  Es mayor de 40aos.  Presenta un alto riesgo de padecer enfermedades cardacas. Qu debo saber sobre las pruebas de deteccin del cncer? Muchos tipos de cncer pueden detectarse  de [place] temprana y, a menudo, pueden prevenirse. Segn su historia clnica y sus antecedentes familiares, es posible que deba realizarse pruebas de deteccin del cncer en diferentes edades. Esto puede incluir pruebas de deteccin de lo siguiente:  Surveyor, minerals.  Cncer de prstata.  Cncer de piel.  Cncer de pulmn. Qu debo saber sobre la enfermedad cardaca, la diabetes y la hipertensin arterial? Presin arterial y enfermedad cardaca  La hipertensin arterial causa enfermedades cardacas y  [place] el riesgo de accidente cerebrovascular. Es ms probable que esto se manifieste en las personas que tienen lecturas de presin arterial alta, tienen ascendencia africana o tienen sobrepeso.  Hable con el mdico sobre sus valores de presin arterial deseados.  Hgase controlar la presin arterial: ? Cada 3 a 5 aos si tiene entre 18 y 47 aos. ? Todos los aos si es mayor de [place].  Si tiene entre 50 y 10 aos y es fumador o Insurance account manager, pregntele al mdico si debe realizarse una prueba de deteccin de aneurisma artico abdominal (AAA) por nica vez. Diabetes Realcese exmenes de deteccin de la diabetes con regularidad. Este anlisis revisa el nivel de azcar en la sangre en [place]. Hgase las pruebas de deteccin:  Cada tresaos despus de los 41aos de edad si tiene un peso normal y un bajo riesgo de padecer diabetes.  Con ms frecuencia y a partir de [place] edad inferior si tiene sobrepeso o un alto riesgo de padecer diabetes. Qu debo saber sobre la prevencin de infecciones? Hepatitis B Si tiene un riesgo ms alto de contraer hepatitis B, debe someterse a un examen de deteccin de este virus. Hable con el mdico para averiguar si tiene riesgo de contraer la infeccin por hepatitis B. Hepatitis C Se recomienda un anlisis de [place] para:  Todos los que nacieron entre 1945 y [phone].  Todas las personas que tengan un riesgo de haber contrado hepatitis C. Enfermedades de transmisin sexual (ETS)  Debe realizarse pruebas de deteccin de ITS todos los aos, incluidas la gonorrea y la clamidia, si: ? Es sexualmente activo y es menor de 24aos. ? Es mayor de 24aos, y Investment banker, operational informa que corre riesgo de tener este tipo de infecciones. ? La actividad sexual ha cambiado desde que le hicieron la ltima prueba de deteccin y tiene un riesgo mayor de Best boy clamidia o Radio broadcast assistant. Pregntele al mdico si usted tiene riesgo.  Pregntele al mdico si usted tiene un alto riesgo de  Museum/gallery curator VIH. El mdico tambin puede recomendarle un medicamento recetado para ayudar a evitar la infeccin por el VIH. Si elige tomar medicamentos para prevenir el VIH, primero debe [employer] de deteccin del VIH. Luego debe hacerse anlisis cada 13meses mientras est tomando los medicamentos. Siga estas instrucciones en su casa: Estilo de vida  No consuma ningn producto que contenga nicotina o tabaco, como cigarrillos, cigarrillos electrnicos y tabaco de Higher education careers adviser. Si necesita ayuda para dejar de fumar, consulte al mdico.  No consuma drogas.  No comparta agujas.  Solicite ayuda a su mdico si necesita apoyo o informacin para abandonar las drogas. Consumo de alcohol  No beba alcohol si el mdico se lo prohbe.  Si bebe alcohol: ? Limite la cantidad que consume de 0 a 2 medidas por da. ? Est atento a la cantidad de alcohol que hay en las bebidas que toma. En los [place], una medida equivale a una botella de cerveza de 12oz (388ml), un vaso de vino de 5oz (139ml) o un vaso de  una bebida alcohlica de alta graduacin de 1oz (21ml). Instrucciones generales  Realcese los estudios de rutina de la salud, dentales y de Public librarian.  [place].  Infrmele a su mdico si: ? Se siente deprimido con frecuencia. ? Alguna vez ha sido vctima de [place] o no se siente seguro en su casa. Resumen  Adoptar un estilo de vida saludable y recibir atencin preventiva son importantes para promover la salud y Musician.  Siga las instrucciones del mdico acerca de una dieta saludable, el ejercicio y la realizacin de pruebas o exmenes para Engineer, building services.  Siga las instrucciones del mdico con respecto al control del colesterol y la presin arterial. Esta informacin no tiene Marine scientist el consejo del mdico. Asegrese de hacerle al mdico cualquier pregunta que tenga. Document Revised: 01/02/2019 Document Reviewed:  01/02/2019 Elsevier Patient Education  2021 Elsevier Inc.      Agustina Caroli, MD [facility]

## 2021-01-14 ENCOUNTER — Ambulatory Visit (INDEPENDENT_AMBULATORY_CARE_PROVIDER_SITE_OTHER): Payer: BC Managed Care – PPO

## 2021-01-14 DIAGNOSIS — J309 Allergic rhinitis, unspecified: Secondary | ICD-10-CM | POA: Diagnosis not present

## 2021-01-22 ENCOUNTER — Ambulatory Visit (INDEPENDENT_AMBULATORY_CARE_PROVIDER_SITE_OTHER): Payer: BC Managed Care – PPO

## 2021-01-22 DIAGNOSIS — J309 Allergic rhinitis, unspecified: Secondary | ICD-10-CM | POA: Diagnosis not present

## 2021-02-04 ENCOUNTER — Ambulatory Visit (INDEPENDENT_AMBULATORY_CARE_PROVIDER_SITE_OTHER): Payer: BC Managed Care – PPO

## 2021-02-04 DIAGNOSIS — J309 Allergic rhinitis, unspecified: Secondary | ICD-10-CM | POA: Diagnosis not present

## 2021-02-10 ENCOUNTER — Ambulatory Visit (INDEPENDENT_AMBULATORY_CARE_PROVIDER_SITE_OTHER): Payer: BC Managed Care – PPO | Admitting: *Deleted

## 2021-02-10 DIAGNOSIS — J309 Allergic rhinitis, unspecified: Secondary | ICD-10-CM | POA: Diagnosis not present

## 2021-02-15 ENCOUNTER — Ambulatory Visit (INDEPENDENT_AMBULATORY_CARE_PROVIDER_SITE_OTHER): Payer: BC Managed Care – PPO

## 2021-02-15 DIAGNOSIS — J309 Allergic rhinitis, unspecified: Secondary | ICD-10-CM | POA: Diagnosis not present

## 2021-02-23 ENCOUNTER — Ambulatory Visit (INDEPENDENT_AMBULATORY_CARE_PROVIDER_SITE_OTHER): Payer: BC Managed Care – PPO | Admitting: *Deleted

## 2021-02-23 DIAGNOSIS — J309 Allergic rhinitis, unspecified: Secondary | ICD-10-CM

## 2021-02-27 ENCOUNTER — Encounter: Payer: Self-pay | Admitting: Emergency Medicine

## 2021-03-01 ENCOUNTER — Ambulatory Visit (INDEPENDENT_AMBULATORY_CARE_PROVIDER_SITE_OTHER): Payer: BC Managed Care – PPO | Admitting: *Deleted

## 2021-03-01 DIAGNOSIS — J309 Allergic rhinitis, unspecified: Secondary | ICD-10-CM | POA: Diagnosis not present

## 2021-03-02 ENCOUNTER — Other Ambulatory Visit: Payer: Self-pay | Admitting: Emergency Medicine

## 2021-03-02 MED ORDER — ROSUVASTATIN CALCIUM 10 MG PO TABS
10.0000 mg | ORAL_TABLET | Freq: Every day | ORAL | 0 refills | Status: DC
Start: 2021-03-02 — End: 2021-09-07

## 2021-03-02 MED ORDER — HYDROCHLOROTHIAZIDE 12.5 MG PO TABS
12.5000 mg | ORAL_TABLET | Freq: Every day | ORAL | 0 refills | Status: DC
Start: 2021-03-02 — End: 2021-09-13

## 2021-03-02 NOTE — Telephone Encounter (Signed)
Copied from [place] [id number]. Topic: Quick Communication - Rx Refill/Question >> Mar 02, 2021  8:34 AM Leward Quan A wrote: Medication: hydrochlorothiazide (HYDRODIURIL) 12.5 MG tablet, rosuvastatin (CRESTOR) 10 MG tablet Have been out for 2 day  Has the patient contacted their pharmacy? Yes.   (Agent: If no, request that the patient contact the pharmacy for the refill.) (Agent: If yes, when and what did the pharmacy advise?)  Preferred Pharmacy (with phone number or street name): [facility] [place], [place] - [address] [place]  Phone:  [phone] Fax:  [phone]     Agent: Please be advised that RX refills may take up to 3 business days. We ask that you follow-up with your pharmacy.

## 2021-03-09 ENCOUNTER — Ambulatory Visit: Payer: BC Managed Care – PPO | Admitting: Emergency Medicine

## 2021-03-09 ENCOUNTER — Ambulatory Visit (INDEPENDENT_AMBULATORY_CARE_PROVIDER_SITE_OTHER): Payer: BC Managed Care – PPO | Admitting: *Deleted

## 2021-03-09 ENCOUNTER — Other Ambulatory Visit: Payer: Self-pay

## 2021-03-09 ENCOUNTER — Encounter: Payer: Self-pay | Admitting: Emergency Medicine

## 2021-03-09 VITALS — BP 136/88 | HR 59 | Temp 98.4°F | Resp 16 | Ht 66.0 in | Wt 170.0 lb

## 2021-03-09 DIAGNOSIS — S62354A Nondisplaced fracture of shaft of fourth metacarpal bone, right hand, initial encounter for closed fracture: Secondary | ICD-10-CM

## 2021-03-09 DIAGNOSIS — J309 Allergic rhinitis, unspecified: Secondary | ICD-10-CM

## 2021-03-09 DIAGNOSIS — S62356A Nondisplaced fracture of shaft of fifth metacarpal bone, right hand, initial encounter for closed fracture: Secondary | ICD-10-CM

## 2021-03-09 NOTE — Progress Notes (Signed)
[facility] 60 y.o.   Chief Complaint  Patient presents with  . Hand Injury    Per patient he had a fall 03/07/2021 fracture to 4th and 5th fingers on the right hand. Patient wants his PCP to be aware.    HISTORY OF PRESENT ILLNESS: This is a 60 y.o. male here for follow-up of right hand fracture fourth and fifth metacarpals.  Seen in emergency room and splinted.  Has appointment to see orthopedist tomorrow.  Also injured left ring finger but no fracture. No other complaints or medical concerns today.  HPI   Prior to Admission medications   Medication Sig Start Date End Date Taking? Authorizing Provider  albuterol (VENTOLIN HFA) 108 (90 Base) MCG/ACT inhaler Inhale 2 puffs into the lungs every 4 (four) hours as needed for wheezing or shortness of breath (coughing fits). 12/01/20  Yes Garnet Sierras, DO  amLODipine (NORVASC) 5 MG tablet Take 1 tablet (5 mg total) by mouth daily. 12/15/20  Yes SagardiaInes Bloomer, MD  aspirin EC 325 MG tablet Take 325 mg by mouth as needed.   Yes [provider]  beclomethasone (QVAR) 80 MCG/ACT inhaler Inhale 2 puffs into the lungs 2 (two) times daily. 08/28/20  Yes Lauraine Rinne, NP  clobetasol (OLUX) 0.05 % topical foam APPLY EXTERNALLY TO THE AFFECTED AREA TWICE DAILY 03/31/20  Yes Jacelyn Pi, Irma M, MD  clobetasol ointment (TEMOVATE) 0.05 % APPLY EXTERNALLY TO THE AFFECTED AREA TWICE DAILY 04/17/20  Yes Jacelyn Pi, Lilia Argue, MD  cyclobenzaprine (FLEXERIL) 10 MG tablet Take 1 tablet (10 mg total) by mouth at bedtime. 12/15/20  Yes Vinal Rosengrant, Ines Bloomer, MD  EPINEPHrine (AUVI-Q) 0.3 mg/0.3 mL IJ SOAJ injection Inject 0.3 mg into the muscle as needed for anaphylaxis. 12/01/20  Yes Garnet Sierras, DO  fluocinolone (VANOS) 0.01 % cream Apply topically daily. 12/06/19  Yes Jacelyn Pi, Irma M, MD  fluocinonide cream (LIDEX) 0.05 % APPLY SPARINGLY EXTERNALLY TO THE AFFECTED AREA TWICE DAILY 02/02/20  Yes Jacelyn Pi, Irma M, MD  fluticasone [facility])  110 MCG/ACT inhaler Inhale 2 puffs into the lungs 2 (two) times daily. 04/30/18  Yes [name], MD  hydrochlorothiazide (HYDRODIURIL) 12.5 MG tablet Take 1 tablet (12.5 mg total) by mouth daily. 03/02/21  Yes Aurilla Coulibaly, Ines Bloomer, MD  hydrocortisone (ANUSOL-HC) 2.5 % rectal cream Place 1 application rectally 2 (two) times daily. 12/15/20  Yes Sarea Fyfe, Ines Bloomer, MD  ibuprofen (ADVIL) 600 MG tablet Take 1 tablet (600 mg total) by mouth every 6 (six) hours as needed. 06/03/20  Yes Melynda Ripple, MD  ketoconazole (NIZORAL) 2 % shampoo APPLY EXTERNALLY 2 TIMES A WEEK. 11/25/17  Yes [name], MD  mometasone (NASONEX) 50 MCG/ACT nasal spray Place 1-2 sprays into the nose daily as needed (nasal symptoms). 12/01/20  Yes Garnet Sierras, DO  montelukast (SINGULAIR) 10 MG tablet TAKE 1 TABLET(10 MG) BY MOUTH AT BEDTIME 08/28/20  Yes Lauraine Rinne, NP  Olopatadine HCl 0.2 % SOLN Apply 1 drop to eye daily as needed (itchy/watery eyes). 12/01/20  Yes Garnet Sierras, DO  OTEZLA 30 MG TABS Take 1 tablet by mouth 2 (two) times daily. 05/18/20  Yes [provider]  rosuvastatin (CRESTOR) 10 MG tablet Take 1 tablet (10 mg total) by mouth daily. 03/02/21  Yes Squire Withey, Ines Bloomer, MD  gabapentin (NEURONTIN) 100 MG capsule Take 100 mg by mouth 3 (three) times daily. Patient not taking: Reported on 03/09/2021    [provider]  hyoscyamine (LEVSIN SL) 0.125 MG SL tablet Place 1 tablet (0.125 mg total) under the tongue every 8 (eight) hours as needed. Patient not taking: Reported on 03/09/2021 08/20/19   Willia Craze, NP  lisinopril (ZESTRIL) 20 MG tablet Take 1 tablet (20 mg total) by mouth daily. Patient not taking: Reported on 03/09/2021 01/12/21   Horald Pollen, MD  tiZANidine (ZANAFLEX) 4 MG tablet Take 1 tablet (4 mg total) by mouth every 8 (eight) hours as needed for muscle spasms. Patient not taking: Reported on 03/09/2021 06/03/20   Melynda Ripple, MD    No Known  Allergies  Patient Active Problem List   Diagnosis Date Noted  . Moderate persistent asthma without complication 57/32/2025  . Prediabetes 04/02/2019  . Hyperlipidemia 12/23/2014  . Psoriasis 06/27/2014  . Multiple lung nodules 12/09/2011  . PULMONARY HYPERTENSION 03/04/2010  . Essential hypertension 02/10/2010  . Other allergic rhinitis 02/10/2010  . Asthma in adult 02/10/2010  . OBSTRUCTIVE SLEEP APNEA 02/04/2010    Past Medical History:  Diagnosis Date  . Allergy    Flonase, Patanol, Zyrtec  . Arthritis    psoriasis  . Asthma    seasonal, with allergies  . Borderline diabetes   . Cluster headache   . Colon polyp 10/27/2011  . Depression    denies 11/11/16; denies 11/21/18 "been years"  . GERD (gastroesophageal reflux disease)   . History of kidney stones    x1  . Hyperlipidemia   . Hypertension   . Low back pain    "in kidney area-was told that it was related to gallstones"  . Prediabetes   . Psoriasis    [name]  . Severe sleep apnea    h/o; had procedure and weight control, does not have to wear CPAP  . 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 APPENDECTOMY    . CHOLECYSTECTOMY N/A 12/16/2014   Procedure: LAPAROSCOPIC CHOLECYSTECTOMY WITH INTRAOPERATIVE CHOLANGIOGRAM;  Surgeon: [name], MD;  Location: [facility];  Service: General;  Laterality: N/A;  . COLONOSCOPY  10/27/2011   single polyp. Repeat 5 years.  Marland Kitchen POLYPECTOMY    . PROSTATE BIOPSY  10/2014   will have another in 3 months  . SEPTOPLASTY  2014  . TONSILLECTOMY  2014  . UVULOPALATOPHARYNGOPLASTY  2014    Social History   Socioeconomic History  . Marital status: Divorced    Spouse name: Not on file  . Number of children: 1  . Years of education: Not on file  . Highest education level: Doctorate  Occupational History  . 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 Smoking status: Never Smoker  . Smokeless tobacco: Never Used  Vaping Use  . Vaping Use: Never used  Substance and Sexual Activity  . Alcohol use: Never    Alcohol/week: 0.0 standard drinks  . Drug use: Never  . Sexual activity: Yes    Comment: SSP  Other Topics Concern  . Not on file  Social History Narrative   Original from [place]; moved to [place] 1995.   Marital status: same sexual partner 4 X years.   Children:  1 child/son (18) at 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        Tobacco: never      Alcohol: none      Drugs: none       Exercise: daily in 2018; 10,000 steps daily; lots of walking on campus at work      ADLs: independent with ADLs.        Sexual activity: dating males only in 2018; bisexual.  Previously married to male.       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         Social Determinants of Health   Financial Resource Strain: Not on file  Food Insecurity: Not on file  Transportation Needs: Not on file  Physical Activity: Not on file  Stress: Not on file  Social Connections: Not on file  Intimate Partner Violence: Not on file    Family History  Problem Relation Age of Onset  . Stroke Mother   . Asthma Sister   . Colon cancer Neg Hx   . Esophageal cancer Neg Hx   . Stomach cancer Neg Hx   . Prostate cancer Neg Hx   . Diabetes Neg Hx   . CAD Neg Hx   . Migraines Neg Hx   . Headache Neg Hx      Review of Systems  Constitutional: Negative.  Negative for chills and fever.  HENT: Negative.  Negative for congestion and sore  throat.   Respiratory: Negative.  Negative for cough and shortness of breath.   Cardiovascular: Negative for chest pain and palpitations.  Gastrointestinal: Negative for abdominal pain, diarrhea, nausea and vomiting.  Genitourinary: Negative.   Skin: Negative.  Negative for rash.  Neurological: Negative.  Negative for dizziness and headaches.  All other systems reviewed and are negative.    Physical Exam Vitals reviewed.  Constitutional:      Appearance: Normal appearance.  HENT:     Head: Normocephalic.  Eyes:     Extraocular Movements: Extraocular movements intact.     Pupils: Pupils are equal, round, and reactive to light.  Cardiovascular:     Rate and Rhythm: Normal rate.  Pulmonary:     Effort: Pulmonary effort is normal.  Musculoskeletal:     Cervical back: Normal range of motion.     Comments: Right hand: Splint in place Left hand: Finger splint in place  Skin:    General: Skin is warm and dry.     Capillary Refill: Capillary refill takes less than 2 seconds.  Neurological:     General: No focal deficit present.     Mental Status: He is oriented to person, place, and time.  Psychiatric:        Mood and Affect: Mood normal.        Behavior: Behavior normal.      ASSESSMENT & PLAN: Tylar was seen today for hand injury.  Diagnoses and all orders for this visit:  Closed nondisplaced fracture of shaft of fourth metacarpal bone of right hand, initial encounter  Closed nondisplaced fracture of shaft of fifth metacarpal bone of right hand, initial encounter    Patient Instructions       If you have lab work done today you will be contacted with your lab results within the next 2 weeks.  If you have not  heard from [place] then please contact us. The fastest way to get your results is to register for My Chart.   IF you received an x-ray today, you will receive an invoice from [facility]. Please contact [facility] at [phone] with questions  or concerns regarding your invoice.   IF you received labwork today, you will receive an invoice from [facility]. Please contact [facility] at [phone] with questions or concerns regarding your invoice.   Our billing staff will not be able to assist you with questions regarding bills from these companies.  You will be contacted with the lab results as soon as they are available. The fastest way to get your results is to activate your My Chart account. Instructions are located on the last page of this paperwork. If you have not heard from [place] regarding the results in 2 weeks, please contact this office.     Fractura metacarpiana Metacarpal Fracture  Ardelia Mems fractura metacarpiana es una quebradura (fractura) de un hueso de la [place]. Los [employer] metacarpianos son aquellos que Printmaker desde los nudillos hasta la [place]. Hay cinco huesos metacarpianos en cada mano. Cules son las causas? Esta lesin puede deberse a lo siguiente:  Una cada.  Un golpe fuerte y Development worker, community.  Una lesin que aprieta el nudillo, que estira el dedo y lo desplaza de su lugar o que aplasta la [place]. Qu incrementa el riesgo? Es ms probable que esta lesin se produzca una persona que:  Practica deportes de Diplomatic Services operational officer.  Tiene una afeccin que provoca huesos dbiles (osteoporosis), en la que los huesos se vuelven delgados y frgiles. Cules son los signos o sntomas? Los sntomas de esta afeccin pueden incluir los siguientes:  Social research officer, government. El dolor puede empeorar al mover los dedos o la mano.  Hinchazn.  Rigidez.  Moretones.  Incapacidad de mover un dedo.  Un dedo de la mano que se ve deformado.  Felisa Bonier o una protuberancia anormal en la mano o el dedo (deformidad). Cmo se trata? El tratamiento depende de la gravedad de la lesin.  Si el hueso fracturado an Therapist, occupational y no se movi, es posible que deba hacer lo siguiente: ? Usar un yeso o una frula durante varias semanas. ? Vendarse el dedo  fracturado con cinta adhesiva junto a uno de los dedos adyacentes.  Si el hueso roto tiene fragmentos que se movieron y ya no estn [place], el mdico puede: ? Realizar una ciruga para reacomodar los [employer] en su lugar con tornillos de metal, clavos, placas o alambres. ? Volver a Sports administrator posicin correcta sin realizar Clementeen Hoof.  Despus de que los [employer] se coloquen alineados, deber usar una frula o un yeso durante varias semanas. El tratamiento tambin puede incluir lo siguiente:  Visitas de seguimiento y radiografas para asegurarse de que est sanando.  Terapia despus de retirar el yeso o la frula. Esto puede incluir: ? Ejercicios para ayudarlo a Risk analyst y Editor, commissioning mano y los dedos (fisioterapia). ? Terapia para ayudar con las actividades cotidianas (terapia ocupacional). Siga estas instrucciones en su casa: Si tiene una frula:  Use la frula como se lo haya indicado el mdico. Qutesela solamente como se lo haya indicado el mdico.  Controle todos los das la piel alrededor de la frula. Comunquese con su mdico si ve algn problema.  Afloje la frula si los dedos: ? Hormiguean. ? Se entumecen. ? Se tornan fros y de [employer].  Mantenga la frula  limpia y [place]. Si tiene un yeso:  No ejerza presin en ninguna parte del yeso hasta que se haya endurecido por completo.  No introduzca nada dentro del yeso para rascarse la piel.  Controle la piel alrededor del [employer]. Comunquese con su mdico si ve algn problema.  Puede aplicar una locin en la piel seca alrededor del yeso. No aplique locin en la piel por debajo del yeso.  Mantenga el yeso seco y limpio. Baarse  No tome baos de inmersin, no practique natacin ni utilice el jacuzzi. Pregunte a su mdico si puede ducharse o darse baos de esponja.  Si la frula o el yeso no son impermeables: ? No deje que se mojen. ? Cbralos con un envoltorio hermtico cuando tome un  bao de inmersin o una ducha. Control del dolor, la rigidez y la hinchazn  Si se lo indican, aplique hielo sobre la zona dolorida. Para hacer esto: ? Si tiene una frula desmontable, qutesela como se lo haya indicado el mdico. ? Ponga el hielo en una bolsa plstica. ? Coloque una [employer] piel y [place] yeso y Therapist, nutritional. ? Aplique el hielo durante 69minutos, 2 o 3veces por da. ? Retire el hielo si la piel se le pone de color rojo brillante. Esto es [employer]. Si no puede sentir dolor, calor o fro, tiene un mayor riesgo de que se dae la zona.  Mueva los dedos con frecuencia.  Cuando est sentado o acostado, mantenga la zona lesionada por encima del nivel del corazn.   Actividad  No levante ni sostenga ningn objeto con la [employer].  Retome sus actividades normales cuando el mdico le diga que es seguro.  Haga los ejercicios como se lo haya indicado el mdico. Conducir  Pregunte al mdico si debe evitar conducir o Risk manager mquinas mientras toma los medicamentos.  Consulte al mdico cundo puede volver a conducir si usted tiene un yeso o una frula en la [place]. Instrucciones generales  Use los medicamentos de venta libre y los recetados solamente como se lo haya indicado el mdico.  No fume ni consuma ningn producto que contenga nicotina o tabaco. Estos pueden retrasar la consolidacin de los [employer]. Si necesita ayuda para dejar de fumar, consulte al mdico.  Concurra a todas las visitas de seguimiento. Comunquese con un mdico si:  El Holiday representative.  El dolor no mejora con medicamentos.  Tiene enrojecimiento o hinchazn que empeora.  Tiene fiebre.  Advierte un olor ftido que proviene del yeso o la frula. Solicite ayuda de inmediato si:  Siente un dolor intenso debajo del yeso o en la mano.  Ocurre lo siguiente, incluso despus de aflojar la frula: ? La mano o las uas se tornan de color azul o gris. ? Siente la mano fra o  entumecida. Resumen  Una fractura metacarpiana es la rotura (fractura) de un hueso de la mano que se extiende desde el nudillo hasta la [place].  El tratamiento depende de la gravedad de la lesin. Es posible que necesite un yeso o una frula para un hueso roto que no se movi. Puede necesitar ciruga para una lesin muy grave en la que partes del hueso de la mano se movieron.  Siga las instrucciones del mdico para el cuidado de la herida despus del tratamiento. Esta informacin no tiene Marine scientist el consejo del mdico. Asegrese de hacerle al mdico cualquier pregunta que tenga. Document Revised: 10/08/2020 Document Reviewed: 10/08/2020 Elsevier Patient  Education  2021 Elsevier Inc.      Agustina Caroli, MD [facility]

## 2021-03-09 NOTE — Patient Instructions (Addendum)
If you have lab work done today you will be contacted with your lab results within the next 2 weeks.  If you have not heard from [place] then please contact us. The fastest way to get your results is to register for My Chart.   IF you received an x-ray today, you will receive an invoice from [facility]. Please contact [facility] at [phone] with questions or concerns regarding your invoice.   IF you received labwork today, you will receive an invoice from [facility]. Please contact [facility] at [phone] with questions or concerns regarding your invoice.   Our billing staff will not be able to assist you with questions regarding bills from these companies.  You will be contacted with the lab results as soon as they are available. The fastest way to get your results is to activate your My Chart account. Instructions are located on the last page of this paperwork. If you have not heard from [place] regarding the results in 2 weeks, please contact this office.     Fractura metacarpiana Metacarpal Fracture  Ardelia Mems fractura metacarpiana es una quebradura (fractura) de un hueso de la [place]. Los [employer] metacarpianos son aquellos que Printmaker desde los nudillos hasta la [place]. Hay cinco huesos metacarpianos en cada mano. Cules son las causas? Esta lesin puede deberse a lo siguiente:  Una cada.  Un golpe fuerte y Development worker, community.  Una lesin que aprieta el nudillo, que estira el dedo y lo desplaza de su lugar o que aplasta la [place]. Qu incrementa el riesgo? Es ms probable que esta lesin se produzca una persona que:  Practica deportes de Diplomatic Services operational officer.  Tiene una afeccin que provoca huesos dbiles (osteoporosis), en la que los huesos se vuelven delgados y frgiles. Cules son los signos o sntomas? Los sntomas de esta afeccin pueden incluir los siguientes:  Social research officer, government. El dolor puede empeorar al mover los dedos o la  mano.  Hinchazn.  Rigidez.  Moretones.  Incapacidad de mover un dedo.  Un dedo de la mano que se ve deformado.  Felisa Bonier o una protuberancia anormal en la mano o el dedo (deformidad). Cmo se trata? El tratamiento depende de la gravedad de la lesin.  Si el hueso fracturado an Therapist, occupational y no se movi, es posible que deba hacer lo siguiente: ? Usar un yeso o una frula durante varias semanas. ? Vendarse el dedo fracturado con cinta adhesiva junto a uno de los dedos adyacentes.  Si el hueso roto tiene fragmentos que se movieron y ya no estn [place], el mdico puede: ? Realizar una ciruga para reacomodar los [employer] en su lugar con tornillos de metal, clavos, placas o alambres. ? Volver a Sports administrator posicin correcta sin realizar Clementeen Hoof.  Despus de que los [employer] se coloquen alineados, deber usar una frula o un yeso durante varias semanas. El tratamiento tambin puede incluir lo siguiente:  Visitas de seguimiento y radiografas para asegurarse de que est sanando.  Terapia despus de retirar el yeso o la frula. Esto puede incluir: ? Ejercicios para ayudarlo a Risk analyst y Editor, commissioning mano y los dedos (fisioterapia). ? Terapia para ayudar con las actividades cotidianas (terapia ocupacional). Siga estas instrucciones en su casa: Si tiene una frula:  Use la frula como se lo haya indicado el mdico. Qutesela solamente como se lo haya indicado el mdico.  Controle todos los das la piel alrededor de la frula. Comunquese con su mdico si  ve algn problema.  Afloje la frula si los dedos: ? Hormiguean. ? Se entumecen. ? Se tornan fros y de [employer].  Mantenga la frula limpia y seca. Si tiene un yeso:  No ejerza presin en ninguna parte del yeso hasta que se haya endurecido por completo.  No introduzca nada dentro del yeso para rascarse la piel.  Controle la piel alrededor del [employer]. Comunquese con su mdico si ve  algn problema.  Puede aplicar una locin en la piel seca alrededor del yeso. No aplique locin en la piel por debajo del yeso.  Mantenga el yeso seco y limpio. Baarse  No tome baos de inmersin, no practique natacin ni utilice el jacuzzi. Pregunte a su mdico si puede ducharse o darse baos de esponja.  Si la frula o el yeso no son impermeables: ? No deje que se mojen. ? Cbralos con un envoltorio hermtico cuando tome un bao de inmersin o una ducha. Control del dolor, la rigidez y la hinchazn  Si se lo indican, aplique hielo sobre la zona dolorida. Para hacer esto: ? Si tiene una frula desmontable, qutesela como se lo haya indicado el mdico. ? Ponga el hielo en una bolsa plstica. ? Coloque una [employer] piel y [place] yeso y Therapist, nutritional. ? Aplique el hielo durante 34minutos, 2 o 3veces por da. ? Retire el hielo si la piel se le pone de color rojo brillante. Esto es [employer]. Si no puede sentir dolor, calor o fro, tiene un mayor riesgo de que se dae la zona.  Mueva los dedos con frecuencia.  Cuando est sentado o acostado, mantenga la zona lesionada por encima del nivel del corazn.   Actividad  No levante ni sostenga ningn objeto con la [employer].  Retome sus actividades normales cuando el mdico le diga que es seguro.  Haga los ejercicios como se lo haya indicado el mdico. Conducir  Pregunte al mdico si debe evitar conducir o Risk manager mquinas mientras toma los medicamentos.  Consulte al mdico cundo puede volver a conducir si usted tiene un yeso o una frula en la [place]. Instrucciones generales  Use los medicamentos de venta libre y los recetados solamente como se lo haya indicado el mdico.  No fume ni consuma ningn producto que contenga nicotina o tabaco. Estos pueden retrasar la consolidacin de los [place]. Si necesita ayuda para dejar de fumar, consulte al mdico.  Concurra a todas las visitas de seguimiento. Comunquese  con un mdico si:  El Holiday representative.  El dolor no mejora con medicamentos.  Tiene enrojecimiento o hinchazn que empeora.  Tiene fiebre.  Advierte un olor ftido que proviene del yeso o la frula. Solicite ayuda de inmediato si:  Siente un dolor intenso debajo del yeso o en la mano.  Ocurre lo siguiente, incluso despus de aflojar la frula: ? La mano o las uas se tornan de color azul o gris. ? Siente la mano fra o entumecida. Resumen  Una fractura metacarpiana es la rotura (fractura) de un hueso de la mano que se extiende desde el nudillo hasta la [place].  El tratamiento depende de la gravedad de la lesin. Es posible que necesite un yeso o una frula para un hueso roto que no se movi. Puede necesitar ciruga para una lesin muy grave en la que partes del hueso de la mano se movieron.  Siga las instrucciones del mdico para el cuidado de la herida despus del tratamiento. Esta  informacin no tiene Marine scientist el consejo del mdico. Asegrese de hacerle al mdico cualquier pregunta que tenga. Document Revised: 10/08/2020 Document Reviewed: 10/08/2020 Elsevier Patient Education  2021 [employer].

## 2021-03-10 DIAGNOSIS — S62354A Nondisplaced fracture of shaft of fourth metacarpal bone, right hand, initial encounter for closed fracture: Secondary | ICD-10-CM | POA: Insufficient documentation

## 2021-03-10 DIAGNOSIS — S62306A Unspecified fracture of fifth metacarpal bone, right hand, initial encounter for closed fracture: Secondary | ICD-10-CM | POA: Insufficient documentation

## 2021-03-10 DIAGNOSIS — S6992XA Unspecified injury of left wrist, hand and finger(s), initial encounter: Secondary | ICD-10-CM | POA: Insufficient documentation

## 2021-03-17 ENCOUNTER — Encounter: Payer: Self-pay | Admitting: Emergency Medicine

## 2021-03-17 ENCOUNTER — Ambulatory Visit (INDEPENDENT_AMBULATORY_CARE_PROVIDER_SITE_OTHER): Payer: BC Managed Care – PPO

## 2021-03-17 DIAGNOSIS — J309 Allergic rhinitis, unspecified: Secondary | ICD-10-CM

## 2021-03-23 ENCOUNTER — Ambulatory Visit (INDEPENDENT_AMBULATORY_CARE_PROVIDER_SITE_OTHER): Payer: BC Managed Care – PPO | Admitting: *Deleted

## 2021-03-23 DIAGNOSIS — J309 Allergic rhinitis, unspecified: Secondary | ICD-10-CM | POA: Diagnosis not present

## 2021-03-29 ENCOUNTER — Ambulatory Visit (INDEPENDENT_AMBULATORY_CARE_PROVIDER_SITE_OTHER): Payer: BC Managed Care – PPO | Admitting: *Deleted

## 2021-03-29 DIAGNOSIS — J309 Allergic rhinitis, unspecified: Secondary | ICD-10-CM | POA: Diagnosis not present

## 2021-04-05 ENCOUNTER — Encounter: Payer: Self-pay | Admitting: Allergy

## 2021-04-05 ENCOUNTER — Ambulatory Visit: Payer: BC Managed Care – PPO | Admitting: Allergy

## 2021-04-05 ENCOUNTER — Other Ambulatory Visit: Payer: Self-pay

## 2021-04-05 VITALS — BP 142/80 | HR 74 | Ht 66.0 in | Wt 177.0 lb

## 2021-04-05 DIAGNOSIS — J4540 Moderate persistent asthma, uncomplicated: Secondary | ICD-10-CM | POA: Diagnosis not present

## 2021-04-05 DIAGNOSIS — J302 Other seasonal allergic rhinitis: Secondary | ICD-10-CM | POA: Diagnosis not present

## 2021-04-05 DIAGNOSIS — J3089 Other allergic rhinitis: Secondary | ICD-10-CM | POA: Insufficient documentation

## 2021-04-05 DIAGNOSIS — H1010 Acute atopic conjunctivitis, unspecified eye: Secondary | ICD-10-CM | POA: Insufficient documentation

## 2021-04-05 DIAGNOSIS — H1013 Acute atopic conjunctivitis, bilateral: Secondary | ICD-10-CM | POA: Diagnosis not present

## 2021-04-05 MED ORDER — ARNUITY ELLIPTA 100 MCG/ACT IN AEPB: 1.0000 | INHALATION_SPRAY | Freq: Every day | RESPIRATORY_TRACT | 5 refills | Status: DC

## 2021-04-05 NOTE — Patient Instructions (Addendum)
Environmental allergies  2021 skin testing was Positive to grass, trees, mold, dust mites.  Continue environmental control measures as below.  Continue Singulair (montelukast) 10mg  daily at night.  May use over the counter antihistamines such as Zyrtec (cetirizine), Claritin (loratadine), Allegra (fexofenadine), or Xyzal (levocetirizine) daily as needed. May take twice a day during flares.  May use Nasonex (mometasone) nasal spray 1 spray per nostril twice a day as needed for nasal congestion.   May use olopatadine eye drops 0.2% once a day as needed for itchy/watery eyes.  Continue allergy injections.   Asthma: . Your breathing was normal today.  . Daily controller medication(s): START Arnuity 17mcg 1 puff once a day. Rinse mouth after each use.  o This is a steroid inhaler to use as a preventative inhaler.   . Continue Singulair (montelukast) 10mg  daily at night. . May use albuterol rescue inhaler 2 puffs every 4 to 6 hours as needed for shortness of breath, chest tightness, coughing, and wheezing. May use albuterol rescue inhaler 2 puffs 5 to 15 minutes prior to strenuous physical activities. Monitor frequency of use.  . Asthma control goals:  o Full participation in all desired activities (may need albuterol before activity) o Albuterol use two times or less a week on average (not counting use with activity) o Cough interfering with sleep two times or less a month o Oral steroids no more than once a year o No hospitalizations  Follow up in 3 months or sooner if needed.  Reducing Pollen Exposure . Pollen seasons: trees (spring), grass (summer) and ragweed/weeds (fall). Marland Kitchen Keep windows closed in your home and car to lower pollen exposure.  Susa Simmonds air conditioning in the bedroom and throughout the house if possible.  . Avoid going out in dry windy days - especially early morning. . Pollen counts are highest between 5 - 10 AM and on dry, hot and windy days.  . Save outside  activities for late afternoon or after a heavy rain, when pollen levels are lower.  . Avoid mowing of grass if you have grass pollen allergy. Marland Kitchen Be aware that pollen can also be transported indoors on people and pets.  . Dry your clothes in an automatic dryer rather than hanging them outside where they might collect pollen.  . Rinse hair and eyes before bedtime. Mold Control . Mold and fungi can grow on a variety of surfaces provided certain temperature and moisture conditions exist.  . Outdoor molds grow on plants, decaying vegetation and soil. The major outdoor mold, Alternaria and Cladosporium, are found in very high numbers during hot and dry conditions. Generally, a late summer - fall peak is seen for common outdoor fungal spores. Rain will temporarily lower outdoor mold spore count, but counts rise rapidly when the rainy period ends. . The most important indoor molds are Aspergillus and Penicillium. Dark, humid and poorly ventilated basements are ideal sites for mold growth. The next most common sites of mold growth are the bathroom and the kitchen. Outdoor (Seasonal) Mold Control . Use air conditioning and keep windows closed. . Avoid exposure to decaying vegetation. Marland Kitchen Avoid leaf raking. . Avoid grain handling. . Consider wearing a face mask if working in moldy areas.  Indoor (Perennial) Mold Control  . Maintain humidity below 50%. . Get rid of mold growth on hard surfaces with water, detergent and, if necessary, 5% bleach (do not mix with other cleaners). Then dry the area completely. If mold covers an area more than 10  square feet, consider hiring an Patent examiner. . For clothing, washing with soap and water is best. If moldy items cannot be cleaned and dried, throw them away. . Remove sources e.g. contaminated carpets. . Repair and seal leaking roofs or pipes. Using dehumidifiers in damp basements may be helpful, but empty the water and clean units regularly to  prevent mildew from forming. All rooms, especially basements, bathrooms and kitchens, require ventilation and cleaning to deter mold and mildew growth. Avoid carpeting on concrete or damp floors, and storing items in damp areas.  Control of House Dust Mite Allergen . Dust mite allergens are a common trigger of allergy and asthma symptoms. While they can be found throughout the house, these microscopic creatures thrive in warm, humid environments such as bedding, upholstered furniture and carpeting. . Because so much time is spent in the bedroom, it is essential to reduce mite levels there.  . Encase pillows, mattresses, and box springs in special allergen-proof fabric covers or airtight, zippered plastic covers.  . Bedding should be washed weekly in hot water (130 F) and dried in a hot dryer. Allergen-proof covers are available for comforters and pillows that can't be regularly washed.  Wendee Copp the allergy-proof covers every few months. Minimize clutter in the bedroom. Keep pets out of the bedroom.  Marland Kitchen Keep humidity less than 50% by using a dehumidifier or air conditioning. You can buy a humidity measuring device called a hygrometer to monitor this.  . If possible, replace carpets with hardwood, linoleum, or washable area rugs. If that's not possible, vacuum frequently with a vacuum that has a HEPA filter. . Remove all upholstered furniture and non-washable window drapes from the bedroom. . Remove all non-washable stuffed toys from the bedroom.  Wash stuffed toys weekly.

## 2021-04-05 NOTE — Progress Notes (Signed)
Follow Up Note  RE: Rickey Cruz MRN: [id number] DOB: 06/17/1961 Date of Office Visit: 04/05/2021  Referring provider: No ref. provider found Primary Cruz provider: Horald Pollen, MD  Chief Complaint: Follow-up (No concerns everything going good )  History of Present Illness: I had the pleasure of seeing Rickey Cruz for a follow up visit at the [facility] on 04/05/2021. He is a 60 y.o. male, who is being followed for allergic rhinoconjunctivitis on AIT, asthma and rash. His previous allergy office visit was on 12/01/2020 with Dr. Maudie Mercury. Today is a regular follow up visit.  Allergic rhino conjunctivitis Started allergy injections in December 2021 and tolerating it well. Currently on Flonase 2 sprays daily. No nosebleeds. Using pataday as needed.  Taking Singulair 10mg  daily.  Allergies are controlled with above regimen.   Rash and other nonspecific skin eruption No issues with eggs and milk - consumed without any issues.. No more rashes.  Moderate persistent asthma Patient is using unknown inhaler 2 puffs once a day. He is not sure if it's albuterol or Flovent. He notices asthma symptoms if he goes outdoors in the Cruz.  Denies any ER/urgent Cruz visits or prednisone use since the last visit.  Assessment and Plan: Rickey Cruz is a 60 y.o. male with: Moderate persistent asthma without complication Past history - Diagnosed with asthma over 20 years ago and being followed by pulmonology.   Interim history - Not sure what inhaler he is using but takes 2 puffs daily prior to going outdoors.   Today's spirometry was normal but not as good as previous one.  . Daily controller medication(s): START Arnuity 159mcg 1 puff once a day. Rinse mouth after each use. Demonstrated proper use.  . Continue Singulair (montelukast) 10mg  daily at night. . May use albuterol rescue inhaler 2 puffs every 4 to 6 hours as needed for shortness of breath, chest tightness, coughing, and  wheezing. May use albuterol rescue inhaler 2 puffs 5 to 15 minutes prior to strenuous physical activities. Monitor frequency of use.  . Discussed the difference between a maintenance ICS inhaler and a rescue SABA inhaler.  . Get spirometry at next visit.  Seasonal and perennial allergic rhinoconjunctivitis Past history - Perennial rhinoconjunctivitis symptoms for 20+ years with worsening in the fall.  Skin testing in 2005 was positive to grass, weed, trees, cat and dog.  Patient was on allergy immunotherapy for a few years with good benefit.  2021 skin testing showed: Positive to grass, trees, mold, dust mites. 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  Continue environmental control measures as below.  Continue Singulair (montelukast) 10mg  daily at night.  May use over the counter antihistamines such as Zyrtec (cetirizine), Claritin (loratadine), Allegra (fexofenadine), or Xyzal (levocetirizine) daily as needed. May take twice a day during flares.  May use Nasonex (mometasone) nasal spray 1 spray per nostril twice a day as needed for nasal congestion.   May use olopatadine eye drops 0.2% once a day as needed for itchy/watery eyes.  Continue allergy injections.   Return in about 3 months (around 07/05/2021).  Meds ordered this encounter  Medications  . Fluticasone Furoate (ARNUITY ELLIPTA) 100 MCG/ACT AEPB    Sig: Inhale 1 puff into the lungs daily. Rinse mouth after each use.    Dispense:  30 each    Refill:  5   Lab Orders  No laboratory test(s) ordered today    Diagnostics: Spirometry:  Tracings reviewed. His effort: Good reproducible  efforts. FVC: 3.74L FEV1: 3.18L, 100% predicted FEV1/FVC ratio: 85% Interpretation: Spirometry consistent with normal pattern.  Please see scanned spirometry results for details.  Medication List:  Current Outpatient Medications  Medication Sig Dispense Refill  . albuterol (VENTOLIN HFA) 108 (90 Base) MCG/ACT inhaler Inhale  2 puffs into the lungs every 4 (four) hours as needed for wheezing or shortness of breath (coughing fits). 8 g 2  . amLODipine (NORVASC) 5 MG tablet Take 1 tablet (5 mg total) by mouth daily. 90 tablet 3  . aspirin EC 325 MG tablet Take 325 mg by mouth as needed.    . clobetasol (OLUX) 0.05 % topical foam APPLY EXTERNALLY TO THE AFFECTED AREA TWICE DAILY 50 g 3  . clobetasol ointment (TEMOVATE) 0.05 % APPLY EXTERNALLY TO THE AFFECTED AREA TWICE DAILY 30 g 3  . cyclobenzaprine (FLEXERIL) 10 MG tablet Take 1 tablet (10 mg total) by mouth at bedtime. 30 tablet 0  . fluocinolone (VANOS) 0.01 % cream Apply topically daily. 30 g 1  . fluocinonide cream (LIDEX) 0.05 % APPLY SPARINGLY EXTERNALLY TO THE AFFECTED AREA TWICE DAILY 30 g 1  . Fluticasone Furoate (ARNUITY ELLIPTA) 100 MCG/ACT AEPB Inhale 1 puff into the lungs daily. Rinse mouth after each use. 30 each 5  . gabapentin (NEURONTIN) 100 MG capsule Take 100 mg by mouth 3 (three) times daily.    . hydrochlorothiazide (HYDRODIURIL) 12.5 MG tablet Take 1 tablet (12.5 mg total) by mouth daily. 90 tablet 0  . ibuprofen (ADVIL) 600 MG tablet Take 1 tablet (600 mg total) by mouth every 6 (six) hours as needed. 30 tablet 0  . ketoconazole (NIZORAL) 2 % shampoo APPLY EXTERNALLY 2 TIMES A WEEK. 120 mL 3  . mometasone (NASONEX) 50 MCG/ACT nasal spray Place 1-2 sprays into the nose daily as needed (nasal symptoms). 1 each 5  . montelukast (SINGULAIR) 10 MG tablet TAKE 1 TABLET(10 MG) BY MOUTH AT BEDTIME 90 tablet 3  . Olopatadine HCl 0.2 % SOLN Apply 1 drop to eye daily as needed (itchy/watery eyes). 2.5 mL 5  . rosuvastatin (CRESTOR) 10 MG tablet Take 1 tablet (10 mg total) by mouth daily. 90 tablet 0  . tiZANidine (ZANAFLEX) 4 MG tablet Take 1 tablet (4 mg total) by mouth every 8 (eight) hours as needed for muscle spasms. 30 tablet 0  . EPINEPHrine (AUVI-Q) 0.3 mg/0.3 mL IJ SOAJ injection Inject 0.3 mg into the muscle as needed for anaphylaxis. (Patient not  taking: Reported on 04/05/2021) 1 each 2  . hydrocortisone (ANUSOL-HC) 2.5 % rectal cream Place 1 application rectally 2 (two) times daily. (Patient not taking: Reported on 04/05/2021) 30 g 5  . hyoscyamine (LEVSIN SL) 0.125 MG SL tablet Place 1 tablet (0.125 mg total) under the tongue every 8 (eight) hours as needed. (Patient not taking: No sig reported) 30 tablet 0  . lisinopril (ZESTRIL) 20 MG tablet Take 1 tablet (20 mg total) by mouth daily. (Patient not taking: No sig reported) 90 tablet 3  . OTEZLA 30 MG TABS Take 1 tablet by mouth 2 (two) times daily. (Patient not taking: Reported on 04/05/2021)     No current facility-administered medications for this visit.   Allergies: No Known Allergies I reviewed his past medical history, social history, family history, and environmental history and no significant changes have been reported from his previous visit.  Review of Systems  Constitutional: Negative for appetite change, chills, fever and unexpected weight change.  HENT: Positive for congestion, postnasal drip, rhinorrhea  and sneezing.   Eyes: Positive for itching.  Respiratory: Negative for cough, chest tightness, shortness of breath and wheezing.   Cardiovascular: Negative for chest pain.  Gastrointestinal: Negative for abdominal pain.  Genitourinary: Negative for difficulty urinating.  Skin: Negative for rash.  Allergic/Immunologic: Positive for environmental allergies.  Neurological: Negative for headaches.   Objective: BP (!) 142/80   Pulse 74   Ht 5\' 6"  (1.676 m)   Wt 177 lb (80.3 kg)   SpO2 97%   BMI 28.57 kg/m  Body mass index is 28.57 kg/m. Physical Exam Vitals and nursing note reviewed.  Constitutional:      Appearance: Normal appearance. He is well-developed.  HENT:     Head: Normocephalic and atraumatic.     Right Ear: External ear normal.     Left Ear: External ear normal.     Nose: Nose normal.     Mouth/Throat:     Mouth: Mucous membranes are moist.      Pharynx: Oropharynx is clear.  Eyes:     Conjunctiva/sclera: Conjunctivae normal.  Cardiovascular:     Rate and Rhythm: Normal rate and regular rhythm.     Heart sounds: Normal heart sounds. No murmur heard. No friction rub. No gallop.   Pulmonary:     Effort: Pulmonary effort is normal.     Breath sounds: Normal breath sounds. No wheezing, rhonchi or rales.  Abdominal:     Palpations: Abdomen is soft.  Musculoskeletal:     Cervical back: Neck supple.  Skin:    General: Skin is warm.     Findings: No rash.  Neurological:     Mental Status: He is alert and oriented to person, place, and time.  Psychiatric:        Behavior: Behavior normal.    Previous notes and tests were reviewed. The plan was reviewed with the patient/family, and all questions/concerned were addressed.  It was my pleasure to see Tyton today and participate in his Cruz. Please feel free to contact me with any questions or concerns.  Sincerely,  Rexene Alberts, DO Allergy & Immunology  [facility] office: [place] office: [phone]

## 2021-04-05 NOTE — Assessment & Plan Note (Addendum)
Past history - Perennial rhinoconjunctivitis symptoms for 20+ years with worsening in the fall.  Skin testing in 2005 was positive to grass, weed, trees, cat and dog.  Patient was on allergy immunotherapy for a few years with good benefit.  2021 skin testing showed: Positive to grass, trees, mold, dust mites. 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  Continue environmental control measures as below.  Continue Singulair (montelukast) 10mg  daily at night.  May use over the counter antihistamines such as Zyrtec (cetirizine), Claritin (loratadine), Allegra (fexofenadine), or Xyzal (levocetirizine) daily as needed. May take twice a day during flares.  May use Nasonex (mometasone) nasal spray 1 spray per nostril twice a day as needed for nasal congestion.   May use olopatadine eye drops 0.2% once a day as needed for itchy/watery eyes.  Continue allergy injections.

## 2021-04-05 NOTE — Assessment & Plan Note (Addendum)
Past history - Diagnosed with asthma over 20 years ago and being followed by pulmonology.   Interim history - Not sure what inhaler he is using but takes 2 puffs daily prior to going outdoors.   Today's spirometry was normal but not as good as previous one.  . Daily controller medication(s): START Arnuity 130mcg 1 puff once a day. Rinse mouth after each use. Demonstrated proper use.  . Continue Singulair (montelukast) 10mg  daily at night. . May use albuterol rescue inhaler 2 puffs every 4 to 6 hours as needed for shortness of breath, chest tightness, coughing, and wheezing. May use albuterol rescue inhaler 2 puffs 5 to 15 minutes prior to strenuous physical activities. Monitor frequency of use.  . Discussed the difference between a maintenance ICS inhaler and a rescue SABA inhaler.  . Get spirometry at next visit.

## 2021-04-08 ENCOUNTER — Ambulatory Visit (INDEPENDENT_AMBULATORY_CARE_PROVIDER_SITE_OTHER): Payer: BC Managed Care – PPO | Admitting: *Deleted

## 2021-04-08 DIAGNOSIS — J309 Allergic rhinitis, unspecified: Secondary | ICD-10-CM | POA: Diagnosis not present

## 2021-04-12 ENCOUNTER — Ambulatory Visit (INDEPENDENT_AMBULATORY_CARE_PROVIDER_SITE_OTHER): Payer: BC Managed Care – PPO | Admitting: *Deleted

## 2021-04-12 DIAGNOSIS — J309 Allergic rhinitis, unspecified: Secondary | ICD-10-CM

## 2021-04-19 ENCOUNTER — Ambulatory Visit (INDEPENDENT_AMBULATORY_CARE_PROVIDER_SITE_OTHER): Payer: BC Managed Care – PPO | Admitting: *Deleted

## 2021-04-19 DIAGNOSIS — J309 Allergic rhinitis, unspecified: Secondary | ICD-10-CM

## 2021-04-26 ENCOUNTER — Ambulatory Visit (INDEPENDENT_AMBULATORY_CARE_PROVIDER_SITE_OTHER): Payer: BC Managed Care – PPO

## 2021-04-26 DIAGNOSIS — J309 Allergic rhinitis, unspecified: Secondary | ICD-10-CM | POA: Diagnosis not present

## 2021-05-03 ENCOUNTER — Ambulatory Visit (INDEPENDENT_AMBULATORY_CARE_PROVIDER_SITE_OTHER): Payer: BC Managed Care – PPO | Admitting: *Deleted

## 2021-05-03 DIAGNOSIS — J309 Allergic rhinitis, unspecified: Secondary | ICD-10-CM

## 2021-05-11 ENCOUNTER — Ambulatory Visit (INDEPENDENT_AMBULATORY_CARE_PROVIDER_SITE_OTHER): Payer: BC Managed Care – PPO | Admitting: *Deleted

## 2021-05-11 DIAGNOSIS — J309 Allergic rhinitis, unspecified: Secondary | ICD-10-CM | POA: Diagnosis not present

## 2021-05-17 ENCOUNTER — Ambulatory Visit (INDEPENDENT_AMBULATORY_CARE_PROVIDER_SITE_OTHER): Payer: BC Managed Care – PPO

## 2021-05-17 DIAGNOSIS — J309 Allergic rhinitis, unspecified: Secondary | ICD-10-CM | POA: Diagnosis not present

## 2021-05-25 ENCOUNTER — Ambulatory Visit (INDEPENDENT_AMBULATORY_CARE_PROVIDER_SITE_OTHER): Payer: BC Managed Care – PPO | Admitting: *Deleted

## 2021-05-25 DIAGNOSIS — J309 Allergic rhinitis, unspecified: Secondary | ICD-10-CM

## 2021-05-26 ENCOUNTER — Other Ambulatory Visit: Payer: Self-pay | Admitting: Emergency Medicine

## 2021-05-31 ENCOUNTER — Ambulatory Visit (INDEPENDENT_AMBULATORY_CARE_PROVIDER_SITE_OTHER): Payer: BC Managed Care – PPO

## 2021-05-31 DIAGNOSIS — J309 Allergic rhinitis, unspecified: Secondary | ICD-10-CM

## 2021-06-08 ENCOUNTER — Ambulatory Visit (INDEPENDENT_AMBULATORY_CARE_PROVIDER_SITE_OTHER): Payer: BC Managed Care – PPO | Admitting: *Deleted

## 2021-06-08 DIAGNOSIS — J309 Allergic rhinitis, unspecified: Secondary | ICD-10-CM | POA: Diagnosis not present

## 2021-06-15 ENCOUNTER — Ambulatory Visit (INDEPENDENT_AMBULATORY_CARE_PROVIDER_SITE_OTHER): Payer: BC Managed Care – PPO | Admitting: *Deleted

## 2021-06-15 DIAGNOSIS — J309 Allergic rhinitis, unspecified: Secondary | ICD-10-CM | POA: Diagnosis not present

## 2021-06-30 ENCOUNTER — Ambulatory Visit (INDEPENDENT_AMBULATORY_CARE_PROVIDER_SITE_OTHER): Payer: BC Managed Care – PPO

## 2021-06-30 DIAGNOSIS — J309 Allergic rhinitis, unspecified: Secondary | ICD-10-CM | POA: Diagnosis not present

## 2021-07-05 ENCOUNTER — Ambulatory Visit: Payer: BC Managed Care – PPO | Admitting: Allergy

## 2021-07-07 ENCOUNTER — Encounter: Payer: Self-pay | Admitting: Emergency Medicine

## 2021-07-07 ENCOUNTER — Ambulatory Visit (INDEPENDENT_AMBULATORY_CARE_PROVIDER_SITE_OTHER): Payer: BC Managed Care – PPO

## 2021-07-07 ENCOUNTER — Ambulatory Visit: Payer: BC Managed Care – PPO | Admitting: Emergency Medicine

## 2021-07-07 ENCOUNTER — Other Ambulatory Visit: Payer: Self-pay

## 2021-07-07 VITALS — BP 126/80 | HR 60 | Temp 98.4°F | Ht 66.0 in | Wt 184.0 lb

## 2021-07-07 DIAGNOSIS — R7303 Prediabetes: Secondary | ICD-10-CM

## 2021-07-07 DIAGNOSIS — Z125 Encounter for screening for malignant neoplasm of prostate: Secondary | ICD-10-CM

## 2021-07-07 DIAGNOSIS — E785 Hyperlipidemia, unspecified: Secondary | ICD-10-CM

## 2021-07-07 DIAGNOSIS — J309 Allergic rhinitis, unspecified: Secondary | ICD-10-CM

## 2021-07-07 DIAGNOSIS — I10 Essential (primary) hypertension: Secondary | ICD-10-CM | POA: Diagnosis not present

## 2021-07-07 DIAGNOSIS — N5201 Erectile dysfunction due to arterial insufficiency: Secondary | ICD-10-CM

## 2021-07-07 DIAGNOSIS — J4540 Moderate persistent asthma, uncomplicated: Secondary | ICD-10-CM

## 2021-07-07 DIAGNOSIS — Z114 Encounter for screening for human immunodeficiency virus [HIV]: Secondary | ICD-10-CM

## 2021-07-07 LAB — COMPREHENSIVE METABOLIC PANEL
ALT: 24 U/L (ref 0–53)
AST: 18 U/L (ref 0–37)
Albumin: 4.5 g/dL (ref 3.5–5.2)
Alkaline Phosphatase: 44 U/L (ref 39–117)
BUN: 20 mg/dL (ref 6–23)
CO2: 25 mEq/L (ref 19–32)
Calcium: 9.3 mg/dL (ref 8.4–10.5)
Chloride: 106 mEq/L (ref 96–112)
Creatinine, Ser: 0.99 mg/dL (ref 0.40–1.50)
GFR: 83.07 mL/min (ref >60.00)
Glucose, Bld: 85 mg/dL (ref 70–99)
Potassium: 3.9 mEq/L (ref 3.5–5.1)
Sodium: 139 mEq/L (ref 135–145)
Total Bilirubin: 0.7 mg/dL (ref 0.2–1.2)
Total Protein: 6.8 g/dL (ref 6.0–8.3)

## 2021-07-07 LAB — HEMOGLOBIN A1C: Hgb A1c MFr Bld: 6 % (ref 4.6–6.5)

## 2021-07-07 LAB — LIPID PANEL
Cholesterol: 114 mg/dL (ref 0–200)
HDL: 39.1 mg/dL (ref >39.00)
LDL Cholesterol: 47 mg/dL (ref 0–99)
NonHDL: 74.79
Total CHOL/HDL Ratio: 3
Triglycerides: 141 mg/dL (ref 0.0–149.0)
VLDL: 28.2 mg/dL (ref 0.0–40.0)

## 2021-07-07 LAB — PSA: PSA: 7.14 ng/mL — ABNORMAL HIGH (ref 0.10–4.00)

## 2021-07-07 MED ORDER — TADALAFIL 20 MG PO TABS: 10.0000 mg | ORAL_TABLET | ORAL | 11 refills | Status: DC | PRN

## 2021-07-07 MED ORDER — LISINOPRIL 40 MG PO TABS
40.0000 mg | ORAL_TABLET | Freq: Every day | ORAL | 3 refills | Status: DC
Start: 2021-07-07 — End: 2022-06-06

## 2021-07-07 NOTE — Assessment & Plan Note (Signed)
Stable and well-controlled.  Continue Arnuity Ellipta.

## 2021-07-07 NOTE — Progress Notes (Signed)
[facility] 60 y.o.   Chief Complaint  Patient presents with   Follow-up    6 month follow up, pt would immunizations, he is going to be traveling   Medication Refill    Pt states that he needs a refill of lisinopril in 40mg , instead of 20mg .    HISTORY OF PRESENT ILLNESS: This is a 60 y.o. male with history of hypertension here for follow-up and medication refill. Will also be traveling to [facility], inquiring about meningitis vaccine since there have been a few cases in the area. No other medical concerns today.  Medication Refill Pertinent negatives include no abdominal pain, chest pain, chills, congestion, coughing, fever, headaches, myalgias, nausea, neck pain, rash or vomiting.    Prior to Admission medications   Medication Sig Start Date End Date Taking? Authorizing Provider  albuterol (VENTOLIN HFA) 108 (90 Base) MCG/ACT inhaler Inhale 2 puffs into the lungs every 4 (four) hours as needed for wheezing or shortness of breath (coughing fits). 12/01/20  Yes Garnet Sierras, DO  amLODipine (NORVASC) 5 MG tablet Take 1 tablet (5 mg total) by mouth daily. 12/15/20  Yes SagardiaInes Bloomer, MD  aspirin EC 325 MG tablet Take 325 mg by mouth as needed.   Yes [provider]  clobetasol (OLUX) 0.05 % topical foam APPLY EXTERNALLY TO THE AFFECTED AREA TWICE DAILY 03/31/20  Yes Jacelyn Pi, Irma M, MD  clobetasol ointment (TEMOVATE) 0.05 % APPLY EXTERNALLY TO THE AFFECTED AREA TWICE DAILY 04/17/20  Yes Jacelyn Pi, Lilia Argue, MD  cyclobenzaprine (FLEXERIL) 10 MG tablet Take 1 tablet (10 mg total) by mouth at bedtime. 12/15/20  Yes Jenniah Bhavsar, Ines Bloomer, MD  EPINEPHrine (AUVI-Q) 0.3 mg/0.3 mL IJ SOAJ injection Inject 0.3 mg into the muscle as needed for anaphylaxis. 12/01/20  Yes Garnet Sierras, DO  fluocinolone (VANOS) 0.01 % cream Apply topically daily. 12/06/19  Yes Jacelyn Pi, Irma M, MD  fluocinonide cream (LIDEX) 0.05 % APPLY SPARINGLY EXTERNALLY TO THE AFFECTED AREA TWICE  DAILY 02/02/20  Yes Jacelyn Pi, Irma M, MD  Fluticasone Furoate (ARNUITY ELLIPTA) 100 MCG/ACT AEPB Inhale 1 puff into the lungs daily. Rinse mouth after each use. 04/05/21  Yes Garnet Sierras, DO  gabapentin (NEURONTIN) 100 MG capsule Take 100 mg by mouth 3 (three) times daily.   Yes [provider]  hydrochlorothiazide (HYDRODIURIL) 12.5 MG tablet Take 1 tablet (12.5 mg total) by mouth daily. 03/02/21  Yes Dmarion Perfect, Ines Bloomer, MD  hydrocortisone (ANUSOL-HC) 2.5 % rectal cream Place 1 application rectally 2 (two) times daily. 12/15/20  Yes Kecia Swoboda, Ines Bloomer, MD  hyoscyamine (LEVSIN SL) 0.125 MG SL tablet Place 1 tablet (0.125 mg total) under the tongue every 8 (eight) hours as needed. 08/20/19  Yes Willia Craze, NP  ibuprofen (ADVIL) 600 MG tablet Take 1 tablet (600 mg total) by mouth every 6 (six) hours as needed. 06/03/20  Yes Melynda Ripple, MD  ketoconazole (NIZORAL) 2 % shampoo APPLY EXTERNALLY 2 TIMES A WEEK. 11/25/17  Yes [name], MD  lisinopril (ZESTRIL) 20 MG tablet Take 1 tablet (20 mg total) by mouth daily. 01/12/21  Yes Oluwatimileyin Vivier, Ines Bloomer, MD  mometasone (NASONEX) 50 MCG/ACT nasal spray Place 1-2 sprays into the nose daily as needed (nasal symptoms). 12/01/20  Yes Garnet Sierras, DO  montelukast (SINGULAIR) 10 MG tablet TAKE 1 TABLET(10 MG) BY MOUTH AT BEDTIME 08/28/20  Yes Lauraine Rinne, NP  Olopatadine HCl 0.2 % SOLN Apply 1 drop to eye  daily as needed (itchy/watery eyes). 12/01/20  Yes Garnet Sierras, DO  OTEZLA 30 MG TABS Take 1 tablet by mouth 2 (two) times daily. 05/18/20  Yes [provider]  rosuvastatin (CRESTOR) 10 MG tablet Take 1 tablet (10 mg total) by mouth daily. 03/02/21  Yes Quanda Pavlicek, Ines Bloomer, MD  tiZANidine (ZANAFLEX) 4 MG tablet Take 1 tablet (4 mg total) by mouth every 8 (eight) hours as needed for muscle spasms. 06/03/20  Yes Melynda Ripple, MD    No Known Allergies  Patient Active Problem List   Diagnosis Date Noted   Seasonal and  perennial allergic rhinoconjunctivitis 04/05/2021   Moderate persistent asthma without complication 29/79/8921   Prediabetes 04/02/2019   Hyperlipidemia 12/23/2014   Psoriasis 06/27/2014   Multiple lung nodules 12/09/2011   PULMONARY HYPERTENSION 03/04/2010   Essential hypertension 02/10/2010   Asthma in adult 02/10/2010   OBSTRUCTIVE SLEEP APNEA 02/04/2010    Past Medical History:  Diagnosis Date   Allergy    Flonase, Patanol, Zyrtec   Arthritis    psoriasis   Asthma    seasonal, with allergies   Borderline diabetes    Cluster headache    Colon polyp 10/27/2011   Depression    denies 11/11/16; denies 11/21/18 "been years"   Eczema    GERD (gastroesophageal reflux disease)    History of kidney stones    x1   Hyperlipidemia    Hypertension    Low back pain    "in kidney area-was told that it was related to gallstones"   Prediabetes    Psoriasis    [name]   Severe sleep apnea    h/o; had procedure and weight control, does not have to wear CPAP   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 Repeat 5 years.   POLYPECTOMY     PROSTATE BIOPSY  10/2014   will have another in 3 months   SEPTOPLASTY  2014   TONSILLECTOMY  2014   UVULOPALATOPHARYNGOPLASTY  2014    Social History   Socioeconomic History   Marital status: Divorced    Spouse name: Not on file   Number of children: 1   Years of education: Not on file   Highest education level: Doctorate  Occupational History   Occupation: Product manager: [employer]    Comment: Pharmacist, community  Tobacco Use   Smoking status: Never   Smokeless tobacco: Never  Vaping  Use   Vaping Use: Never used  Substance and Sexual Activity   Alcohol use: Never    Alcohol/week: 0.0 standard drinks   Drug use: Never   Sexual activity: Yes    Comment: SSP  Other Topics Concern   Not on file  Social History Narrative   Original from [place]; moved to [place] 1995.   Marital status: same sexual partner 4 X years.   Children:  1 child/son (18) at 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        Tobacco: never      Alcohol: none      Drugs: none       Exercise: daily in 2018; 10,000 steps daily; lots of walking on campus at work      ADLs: independent with ADLs.        Sexual activity: dating males only in 2018; bisexual.  Previously married to male.       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         Social Determinants of Health   Financial Resource Strain: Not on file  Food Insecurity: Not on file  Transportation Needs: Not on file  Physical Activity: Not on file  Stress: Not on file  Social Connections: Not on file  Intimate Partner Violence: Not on file    Family History  Problem Relation Age of Onset   Stroke Mother    Asthma Sister    Colon cancer Neg Hx    Esophageal cancer Neg Hx    Stomach cancer Neg Hx    Prostate cancer Neg Hx    Diabetes Neg Hx    CAD Neg Hx    Migraines Neg Hx    Headache Neg Hx      Review of Systems  Constitutional: Negative.  Negative for chills and fever.  HENT: Negative.  Negative for congestion and nosebleeds.   Respiratory: Negative.  Negative for cough and shortness of breath.   Cardiovascular: Negative.  Negative  for chest pain and palpitations.  Gastrointestinal:  Negative for abdominal pain, diarrhea, nausea and vomiting.  Genitourinary: Negative.  Negative for dysuria and hematuria.  Musculoskeletal: Negative.  Negative for back pain, myalgias and neck pain.  Skin: Negative.  Negative for rash.  Neurological: Negative.  Negative for dizziness and headaches.  All other systems reviewed and are negative.  Today's Vitals   07/07/21 1420  BP: 126/80  Pulse: 60  Temp: 98.4 F (36.9 C)  TempSrc: Oral  SpO2: 97%  Weight: 184 lb (83.5 kg)  Height: 5\' 6"  (1.676 m)   Body mass index is 29.7 kg/m. Wt Readings from Last 3 Encounters:  07/07/21 184 lb (83.5 kg)  04/05/21 177 lb (80.3 kg)  03/09/21 170 lb (77.1 kg)    Physical Exam Vitals reviewed.  Constitutional:      Appearance: Normal appearance.  HENT:     Head: Normocephalic.  Eyes:     Extraocular Movements: Extraocular movements intact.     Conjunctiva/sclera: Conjunctivae normal.     Pupils: Pupils are equal, round, and reactive to light.  Cardiovascular:     Rate and Rhythm: Normal rate and regular rhythm.     Pulses: Normal pulses.     Heart sounds: Normal heart sounds.  Pulmonary:     Effort: Pulmonary effort is normal.     Breath sounds: Normal breath sounds.  Abdominal:     General: There is no distension.     Palpations: Abdomen is soft.     Tenderness: There is no abdominal tenderness.  Musculoskeletal:        General: Normal range of motion.     Cervical back: Normal range of motion and neck supple.     Right lower leg: No edema.     Left lower leg: No edema.  Skin:    General: Skin  is warm and dry.     Capillary Refill: Capillary refill takes less than 2 seconds.  Neurological:     General: No focal deficit present.     Mental Status: He is alert and oriented to person, place, and time.  Psychiatric:        Mood and Affect: Mood normal.        Behavior: Behavior normal.     ASSESSMENT & PLAN: A total of  30 minutes was spent with the patient and counseling/coordination of care regarding preparing for this visit, review of most recent office visit notes, review of most recent blood work results, review of all medications, health maintenance items, education on nutrition, cardiovascular risks associated with dyslipidemia and hypertension, prognosis, documentation and need for follow-up.  Essential hypertension Well-controlled hypertension.  Continue lisinopril 40 mg daily and amlodipine 5 mg daily. Dietary approach to stop hypertension discussed.  Moderate persistent asthma without complication Stable and well-controlled.  Continue Arnuity Ellipta.  Hyperlipidemia Diet and nutrition discussed.  Continue rosuvastatin 10 mg daily.  Prediabetes Diet and nutrition discussed.  Hemoglobin A1c done today.  Leonce was seen today for follow-up and medication refill.  Diagnoses and all orders for this visit:  Essential hypertension -     CBC with Differential/Platelet -     Comprehensive metabolic panel -     lisinopril (ZESTRIL) 40 MG tablet; Take 1 tablet (40 mg total) by mouth daily.  Moderate persistent asthma in adult without complication  Prediabetes -     Hemoglobin A1c  Dyslipidemia -     Lipid panel  Erectile dysfunction due to arterial insufficiency -     tadalafil (CIALIS) 20 MG tablet; Take 0.5-1 tablets (10-20 mg total) by mouth every other day as needed for erectile dysfunction.  Prostate cancer screening -     PSA  Screening for HIV (human immunodeficiency virus) -     HIV Antibody (routine testing w rflx)  Moderate persistent asthma without complication  Patient Instructions  Mantenimiento de Technical sales engineer en [place] Maintenance, Male Adoptar un estilo de vida saludable y recibir atencin preventiva son importantes para promover la salud y Musician. Consulte al mdico sobre: El esquema adecuado para hacerse pruebas y exmenes peridicos. Cosas que puede  hacer por su cuenta para prevenir enfermedades y [place] sano. Qu debo saber sobre la dieta, el peso y el ejercicio? Consuma una dieta saludable  Consuma una dieta que incluya muchas verduras, frutas, productos lcteos con bajo contenido de [place] y Advertising account planner. No consuma muchos alimentos ricos en grasas slidas, azcares agregados o sodio.  Mantenga un peso saludable El ndice de masa muscular [facility]) es una medida que puede utilizarse para identificar posibles problemas de [place]. Proporciona una estimacin de la grasa corporal basndose en el peso y la altura. Su mdico puede ayudarle Soil scientist [place] y a Scientist, forensic o Theatre manager un peso saludable. Haga ejercicio con regularidad Haga ejercicio con regularidad. Esta es una de las prcticas ms importantes que puede hacer por su salud. [place] [employer] de los adultos deben seguir estas pautas: Optometrist, al menos, 150 minutos de actividad fsica por semana. El ejercicio debe aumentar la frecuencia cardaca y Nature conservation officer transpirar (ejercicio de intensidad moderada). Hacer ejercicios de fortalecimiento por lo [employer] por semana. Agregue esto a su plan de ejercicio de intensidad moderada. Pasar menos tiempo sentados. Incluso la actividad fsica ligera puede ser beneficiosa. Controle sus niveles de colesterol y lpidos en la sangre Comience a realizarse anlisis de  lpidos y colesterol en la sangre a los20 aos y luego reptalos cada 5 aos. Es posible que Automotive engineer los niveles de colesterol con mayor frecuencia si: Sus niveles de lpidos y colesterol son altos. Es mayor de 7 aos. Presenta un alto riesgo de padecer enfermedades cardacas. Qu debo saber sobre las pruebas de deteccin del cncer? Muchos tipos de cncer pueden detectarse de manera temprana y, a menudo, pueden prevenirse. Segn su historia clnica y sus antecedentes familiares, es posible que deba realizarse pruebas de deteccin del cncer en diferentes edades. Esto puede  incluir pruebas de deteccin de lo siguiente: Surveyor, minerals. Cncer de prstata. Cncer de piel. Cncer de pulmn. Qu debo saber sobre la enfermedad cardaca, la diabetes y la hipertensinarterial? Presin arterial y enfermedad cardaca La hipertensin arterial causa enfermedades cardacas y [place] el riesgo de accidente cerebrovascular. Es ms probable que esto se manifieste en las personas que tienen lecturas de presin arterial alta, tienen ascendencia africana o tienen sobrepeso. Hable con el mdico sobre sus valores de presin arterial deseados. Hgase controlar la presin arterial: Cada 3 a 5 aos si tiene entre 18 y 43 aos. Todos los aos si es mayor de 40 aos. Si tiene entre 60 y 62 aos y es fumador o Insurance account manager, pregntele al mdico si debe realizarse una prueba de deteccin de aneurisma artico abdominal (AAA) por nica vez. Diabetes Realcese exmenes de deteccin de la diabetes con regularidad. Este anlisis revisa el nivel de azcar en la sangre en [place]. Hgase las pruebas de deteccin: Cada tres aos despus de los 31 aos de edad si tiene un peso normal y un bajo riesgo de padecer diabetes. Con ms frecuencia y a partir de Road Runner edad inferior si tiene sobrepeso o un alto riesgo de padecer diabetes. Qu debo saber sobre la prevencin de infecciones? Hepatitis B Si tiene un riesgo ms alto de contraer hepatitis B, debe someterse a un examen de deteccin de este virus. Hable con el mdico para averiguar si tiene riesgode contraer la infeccin por hepatitis B. Hepatitis C Se recomienda un anlisis de [place] para: Todos los que nacieron entre 1945 y [phone]. Todas las personas que tengan un riesgo de haber contrado hepatitis C. Enfermedades de transmisin sexual (ETS) Debe realizarse pruebas de deteccin de ITS todos los aos, incluidas la gonorrea y la clamidia, si: Es sexualmente activo y es menor de 70 aos. Es mayor de 103 aos, y Investment banker, operational informa que corre  riesgo de tener este tipo de infecciones. La actividad sexual ha cambiado desde que le hicieron la ltima prueba de deteccin y tiene un riesgo mayor de Best boy clamidia o Radio broadcast assistant. Pregntele al mdico si usted tiene riesgo. Pregntele al mdico si usted tiene un alto riesgo de Museum/gallery curator VIH. El mdico tambin puede recomendarle un medicamento recetado para ayudar a evitar la infeccin por el VIH. Si elige tomar medicamentos para prevenir el VIH, primero debe [employer] de deteccin del VIH. Luego debe hacerse anlisis cada 3 meses mientras est tomando los medicamentos. Siga estas instrucciones en su casa: Estilo de vida No consuma ningn producto que contenga nicotina o tabaco, como cigarrillos, cigarrillos electrnicos y tabaco de Higher education careers adviser. Si necesita ayuda para dejar de fumar, consulte al mdico. No consuma drogas. No comparta agujas. Solicite ayuda a su mdico si necesita apoyo o informacin para abandonar las drogas. Consumo de alcohol No beba alcohol si el mdico se lo prohbe. Si bebe alcohol: Limite la cantidad que consume de 0 a 2 medidas  por Training and development officer. Est atento a la cantidad de alcohol que hay en las bebidas que toma. En los [place], una medida equivale a una botella de cerveza de 12 oz (355 ml), un vaso de vino de 5 oz (148 ml) o un vaso de una bebida alcohlica de alta graduacin de 1 oz (44 ml). Instrucciones generales Realcese los estudios de rutina de la salud, dentales y de Public librarian. [place]. Infrmele a su mdico si: Se siente deprimido con frecuencia. Alguna vez ha sido vctima de [place] o no se siente seguro en su casa. Resumen Adoptar un estilo de vida saludable y recibir atencin preventiva son importantes para promover la salud y Musician. Siga las instrucciones del mdico acerca de una dieta saludable, el ejercicio y la realizacin de pruebas o exmenes para Engineer, building services. Siga las instrucciones del mdico con  respecto al control del colesterol y la presin arterial. Esta informacin no tiene Marine scientist el consejo del mdico. Asegresede hacerle al mdico cualquier pregunta que tenga. Document Revised: 01/02/2019 Document Reviewed: 01/02/2019 Elsevier Patient Education  2022 [place], MD [facility]

## 2021-07-07 NOTE — Assessment & Plan Note (Signed)
Well-controlled hypertension.  Continue lisinopril 40 mg daily and amlodipine 5 mg daily. Dietary approach to stop hypertension discussed.

## 2021-07-07 NOTE — Assessment & Plan Note (Signed)
Diet and nutrition discussed.  Hemoglobin A1c done today. 

## 2021-07-07 NOTE — Patient Instructions (Signed)
Mantenimiento de Teacher, English as a foreign language, Male Adoptar un estilo de vida saludable y recibir atencin preventiva son importantes para promover la salud y Musician. Consulte al mdico sobre: El esquema adecuado para hacerse pruebas y exmenes peridicos. Cosas que puede hacer por su cuenta para prevenir enfermedades y [place] sano. Qu debo saber sobre la dieta, el peso y el ejercicio? Consuma una dieta saludable  Consuma una dieta que incluya muchas verduras, frutas, productos lcteos con bajo contenido de [place] y Advertising account planner. No consuma muchos alimentos ricos en grasas slidas, azcares agregados o sodio.  Mantenga un peso saludable El ndice de masa muscular [facility]) es una medida que puede utilizarse para identificar posibles problemas de [place]. Proporciona una estimacin de la grasa corporal basndose en el peso y la altura. Su mdico puede ayudarle Soil scientist [place] y a Scientist, forensic o Theatre manager un peso saludable. Haga ejercicio con regularidad Haga ejercicio con regularidad. Esta es una de las prcticas ms importantes que puede hacer por su salud. La [employer] de los adultos deben seguir estas pautas: Optometrist, al menos, 150 minutos de actividad fsica por semana. El ejercicio debe aumentar la frecuencia cardaca y Nature conservation officer transpirar (ejercicio de intensidad moderada). Hacer ejercicios de fortalecimiento por lo [employer] por semana. Agregue esto a su plan de ejercicio de intensidad moderada. Pasar menos tiempo sentados. Incluso la actividad fsica ligera puede ser beneficiosa. Controle sus niveles de colesterol y lpidos en la sangre Comience a realizarse anlisis de lpidos y colesterol en la sangre a los20 aos y luego reptalos cada 5 aos. Es posible que Automotive engineer los niveles de colesterol con mayor frecuencia si: Sus niveles de lpidos y colesterol son altos. Es mayor de 21 aos. Presenta un alto riesgo de padecer enfermedades cardacas. Qu debo  saber sobre las pruebas de deteccin del cncer? Muchos tipos de cncer pueden detectarse de manera temprana y, a menudo, pueden prevenirse. Segn su historia clnica y sus antecedentes familiares, es posible que deba realizarse pruebas de deteccin del cncer en diferentes edades. Esto puede incluir pruebas de deteccin de lo siguiente: Surveyor, minerals. Cncer de prstata. Cncer de piel. Cncer de pulmn. Qu debo saber sobre la enfermedad cardaca, la diabetes y la hipertensinarterial? Presin arterial y enfermedad cardaca La hipertensin arterial causa enfermedades cardacas y [place] el riesgo de accidente cerebrovascular. Es ms probable que esto se manifieste en las personas que tienen lecturas de presin arterial alta, tienen ascendencia africana o tienen sobrepeso. Hable con el mdico sobre sus valores de presin arterial deseados. Hgase controlar la presin arterial: Cada 3 a 5 aos si tiene entre 18 y 42 aos. Todos los aos si es mayor de 40 aos. Si tiene entre 85 y 52 aos y es fumador o Insurance account manager, pregntele al mdico si debe realizarse una prueba de deteccin de aneurisma artico abdominal (AAA) por nica vez. Diabetes Realcese exmenes de deteccin de la diabetes con regularidad. Este anlisis revisa el nivel de azcar en la sangre en [place]. Hgase las pruebas de deteccin: Cada tres aos despus de los 72 aos de edad si tiene un peso normal y un bajo riesgo de padecer diabetes. Con ms frecuencia y a partir de Marcus edad inferior si tiene sobrepeso o un alto riesgo de padecer diabetes. Qu debo saber sobre la prevencin de infecciones? Hepatitis B Si tiene un riesgo ms alto de contraer hepatitis B, debe someterse a un examen de deteccin de este virus. Hable con el mdico para averiguar si tiene riesgode  contraer la infeccin por hepatitis B. Hepatitis C Se recomienda un anlisis de [place] para: Todos los que nacieron entre 1945 y [phone]. Todas las personas que  tengan un riesgo de haber contrado hepatitis C. Enfermedades de transmisin sexual (ETS) Debe realizarse pruebas de deteccin de ITS todos los aos, incluidas la gonorrea y la clamidia, si: Es sexualmente activo y es menor de 61 aos. Es mayor de 36 aos, y Investment banker, operational informa que corre riesgo de tener este tipo de infecciones. La actividad sexual ha cambiado desde que le hicieron la ltima prueba de deteccin y tiene un riesgo mayor de Best boy clamidia o Radio broadcast assistant. Pregntele al mdico si usted tiene riesgo. Pregntele al mdico si usted tiene un alto riesgo de Museum/gallery curator VIH. El mdico tambin puede recomendarle un medicamento recetado para ayudar a evitar la infeccin por el VIH. Si elige tomar medicamentos para prevenir el VIH, primero debe [employer] de deteccin del VIH. Luego debe hacerse anlisis cada 3 meses mientras est tomando los medicamentos. Siga estas instrucciones en su casa: Estilo de vida No consuma ningn producto que contenga nicotina o tabaco, como cigarrillos, cigarrillos electrnicos y tabaco de Higher education careers adviser. Si necesita ayuda para dejar de fumar, consulte al mdico. No consuma drogas. No comparta agujas. Solicite ayuda a su mdico si necesita apoyo o informacin para abandonar las drogas. Consumo de alcohol No beba alcohol si el mdico se lo prohbe. Si bebe alcohol: Limite la cantidad que consume de 0 a 2 medidas por da. Est atento a la cantidad de alcohol que hay en las bebidas que toma. En los [place], una medida equivale a una botella de cerveza de 12 oz (355 ml), un vaso de vino de 5 oz (148 ml) o un vaso de una bebida alcohlica de alta graduacin de 1 oz (44 ml). Instrucciones generales Realcese los estudios de rutina de la salud, dentales y de Public librarian. [place]. Infrmele a su mdico si: Se siente deprimido con frecuencia. Alguna vez ha sido vctima de [place] o no se siente seguro en su casa. Resumen Adoptar un estilo  de vida saludable y recibir atencin preventiva son importantes para promover la salud y Musician. Siga las instrucciones del mdico acerca de una dieta saludable, el ejercicio y la realizacin de pruebas o exmenes para Engineer, building services. Siga las instrucciones del mdico con respecto al control del colesterol y la presin arterial. Esta informacin no tiene Marine scientist el consejo del mdico. Asegresede hacerle al mdico cualquier pregunta que tenga. Document Revised: 01/02/2019 Document Reviewed: 01/02/2019 Elsevier Patient Education  [place].

## 2021-07-07 NOTE — Assessment & Plan Note (Signed)
Diet and nutrition discussed. Continue rosuvastatin 10 mg daily.  

## 2021-07-08 LAB — CBC WITH DIFFERENTIAL/PLATELET
Basophils Absolute: 0 10*3/uL (ref 0.0–0.1)
Basophils Relative: 0.5 % (ref 0.0–3.0)
Eosinophils Absolute: 0 10*3/uL (ref 0.0–0.7)
Eosinophils Relative: 0.5 % (ref 0.0–5.0)
HCT: 45.6 % (ref 39.0–52.0)
Hemoglobin: 16 g/dL (ref 13.0–17.0)
Lymphocytes Relative: 18.3 % (ref 12.0–46.0)
Lymphs Abs: 1.2 10*3/uL (ref 0.7–4.0)
MCHC: 35.1 g/dL (ref 30.0–36.0)
MCV: 88.8 fl (ref 78.0–100.0)
Monocytes Absolute: 0.7 10*3/uL (ref 0.1–1.0)
Monocytes Relative: 10.6 % (ref 3.0–12.0)
Neutro Abs: 4.4 10*3/uL (ref 1.4–7.7)
Neutrophils Relative %: 70.1 % (ref 43.0–77.0)
Platelets: 221 10*3/uL (ref 150.0–400.0)
RBC: 5.13 Mil/uL (ref 4.22–5.81)
RDW: 13.3 % (ref 11.5–15.5)
WBC: 6.3 10*3/uL (ref 4.0–10.5)

## 2021-07-08 LAB — HIV ANTIBODY (ROUTINE TESTING W REFLEX): HIV 1&2 Ab, 4th Generation: NONREACTIVE

## 2021-07-09 ENCOUNTER — Telehealth: Payer: Self-pay | Admitting: Emergency Medicine

## 2021-07-09 ENCOUNTER — Other Ambulatory Visit: Payer: Self-pay | Admitting: Emergency Medicine

## 2021-07-09 DIAGNOSIS — N5201 Erectile dysfunction due to arterial insufficiency: Secondary | ICD-10-CM

## 2021-07-09 MED ORDER — CIPROFLOXACIN HCL 500 MG PO TABS: 500.0000 mg | ORAL_TABLET | Freq: Two times a day (BID) | ORAL | 0 refills | Status: AC

## 2021-07-09 MED ORDER — SILDENAFIL CITRATE 100 MG PO TABS: 50.0000 mg | ORAL_TABLET | Freq: Every day | ORAL | 11 refills | Status: DC | PRN

## 2021-07-09 NOTE — Telephone Encounter (Signed)
Spoke to patient about results. Increased PSA. Has appointment to see urologist next month.

## 2021-07-12 ENCOUNTER — Ambulatory Visit: Payer: Self-pay | Admitting: Emergency Medicine

## 2021-07-15 ENCOUNTER — Ambulatory Visit (INDEPENDENT_AMBULATORY_CARE_PROVIDER_SITE_OTHER): Payer: BC Managed Care – PPO | Admitting: *Deleted

## 2021-07-15 DIAGNOSIS — J309 Allergic rhinitis, unspecified: Secondary | ICD-10-CM

## 2021-07-21 ENCOUNTER — Ambulatory Visit (INDEPENDENT_AMBULATORY_CARE_PROVIDER_SITE_OTHER): Payer: BC Managed Care – PPO | Admitting: *Deleted

## 2021-07-21 DIAGNOSIS — J309 Allergic rhinitis, unspecified: Secondary | ICD-10-CM

## 2021-07-29 ENCOUNTER — Ambulatory Visit (INDEPENDENT_AMBULATORY_CARE_PROVIDER_SITE_OTHER): Payer: BC Managed Care – PPO

## 2021-07-29 DIAGNOSIS — J309 Allergic rhinitis, unspecified: Secondary | ICD-10-CM | POA: Diagnosis not present

## 2021-07-30 ENCOUNTER — Encounter (HOSPITAL_COMMUNITY): Payer: Self-pay

## 2021-07-30 ENCOUNTER — Ambulatory Visit (INDEPENDENT_AMBULATORY_CARE_PROVIDER_SITE_OTHER): Payer: BC Managed Care – PPO

## 2021-07-30 ENCOUNTER — Ambulatory Visit (HOSPITAL_COMMUNITY)
Admission: EM | Admit: 2021-07-30 | Discharge: 2021-07-30 | Disposition: A | Discharge status: Home / Self Care | Payer: BC Managed Care – PPO | Attending: Medical Oncology | Admitting: Medical Oncology

## 2021-07-30 ENCOUNTER — Other Ambulatory Visit: Payer: Self-pay

## 2021-07-30 DIAGNOSIS — M542 Cervicalgia: Secondary | ICD-10-CM

## 2021-07-30 DIAGNOSIS — W19XXXA Unspecified fall, initial encounter: Secondary | ICD-10-CM

## 2021-07-30 MED ORDER — CYCLOBENZAPRINE HCL 10 MG PO TABS: 10.0000 mg | ORAL_TABLET | Freq: Every day | ORAL | 0 refills | Status: DC

## 2021-07-30 MED ORDER — PREDNISONE 10 MG PO TABS: ORAL_TABLET | ORAL | 0 refills | Status: DC

## 2021-07-30 NOTE — ED Provider Notes (Signed)
[place]    CSN: [id number] Arrival date & time: 07/30/21  1419      History   Chief Complaint Chief Complaint  Patient presents with   Neck Pain    HPI Rickey Cruz is a 60 y.o. male.   HPI  Neck Pain: Patient reports that he sustained a fall in March.  He denies hitting his head during this fall but he states that he did brace his neck to prevent impact of his head.  He states that since this time he has had off-and-on neck pain.  A few weeks ago he was at a conference in [place] when his pain suddenly worsened and has been worsened since.  He reports that he feels a pulling sensation in his neck and he has some tenderness with range of motion exercises of his neck.  He has tried Advil, aspirin with only mild improvement.  He states that when the worsening first happened about 3 weeks ago he did have some numbness and tingling down his left arm but this has resolved.  No loss of grip strength, loss of sensation, he has had mild intermittent headaches.  Past Medical History:  Diagnosis Date   Allergy    Flonase, Patanol, Zyrtec   Arthritis    psoriasis   Asthma    seasonal, with allergies   Borderline diabetes    Cluster headache    Colon polyp 10/27/2011   Depression    denies 11/11/16; denies 11/21/18 "been years"   Eczema    GERD (gastroesophageal reflux disease)    History of kidney stones    x1   Hyperlipidemia    Hypertension    Low back pain    "in kidney area-was told that it was related to gallstones"   Prediabetes    Psoriasis    [name]   Severe sleep apnea    h/o; had procedure and weight control, does not have to wear CPAP   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, 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    Patient Active Problem List   Diagnosis Date Noted   Seasonal and perennial allergic rhinoconjunctivitis 04/05/2021   Moderate persistent asthma without complication 99991111   Prediabetes 04/02/2019   Hyperlipidemia 12/23/2014    Psoriasis 06/27/2014   Multiple lung nodules 12/09/2011   PULMONARY HYPERTENSION 03/04/2010   Essential hypertension 02/10/2010   Asthma in adult 02/10/2010   OBSTRUCTIVE SLEEP APNEA 02/04/2010    Past Surgical History:  Procedure Laterality Date   APPENDECTOMY     CHOLECYSTECTOMY N/A 12/16/2014   Procedure: LAPAROSCOPIC CHOLECYSTECTOMY WITH INTRAOPERATIVE CHOLANGIOGRAM;  Surgeon: [name], MD;  Location: [facility];  Service: General;  Laterality: N/A;   COLONOSCOPY  10/27/2011   single polyp. Repeat 5 years.   POLYPECTOMY     PROSTATE BIOPSY  10/2014   will have another in 3 months   SEPTOPLASTY  2014   TONSILLECTOMY  2014   UVULOPALATOPHARYNGOPLASTY  2014       Home Medications    Prior to Admission medications   Medication Sig Start Date End Date Taking? Authorizing Provider  predniSONE (DELTASONE) 10 MG tablet Take 4 tablets by mouth with breakfast for 2 days, 2 tablets by mouth for 2 days and 1 tablet by mouth for 2 days. 07/30/21  Yes Wyndham Santilli M, PA-C  albuterol (VENTOLIN HFA) 108 (90 Base) MCG/ACT inhaler Inhale 2 puffs into the lungs every 4 (four) hours as needed for wheezing or shortness of breath (coughing fits).  12/01/20   Garnet Sierras, DO  amLODipine (NORVASC) 5 MG tablet Take 1 tablet (5 mg total) by mouth daily. 12/15/20   Horald Pollen, MD  aspirin EC 325 MG tablet Take 325 mg by mouth as needed.    [provider]  clobetasol (OLUX) 0.05 % topical foam APPLY EXTERNALLY TO THE AFFECTED AREA TWICE DAILY 03/31/20   Jacelyn Pi, Lilia Argue, MD  clobetasol ointment (TEMOVATE) 0.05 % APPLY EXTERNALLY TO THE AFFECTED AREA TWICE DAILY 04/17/20   Jacelyn Pi, Lilia Argue, MD  cyclobenzaprine (FLEXERIL) 10 MG tablet Take 1 tablet (10 mg total) by mouth at bedtime. 07/30/21   Hughie Closs, PA-C  EPINEPHrine (AUVI-Q) 0.3 mg/0.3 mL IJ SOAJ injection Inject 0.3 mg into the muscle as needed for anaphylaxis. 12/01/20   Garnet Sierras, DO  fluocinolone (VANOS)  0.01 % cream Apply topically daily. 12/06/19   Jacelyn Pi, Lilia Argue, MD  fluocinonide cream (LIDEX) 0.05 % APPLY SPARINGLY EXTERNALLY TO THE AFFECTED AREA TWICE DAILY 02/02/20   Jacelyn Pi, Lilia Argue, MD  Fluticasone Furoate (ARNUITY ELLIPTA) 100 MCG/ACT AEPB Inhale 1 puff into the lungs daily. Rinse mouth after each use. 04/05/21   Garnet Sierras, DO  gabapentin (NEURONTIN) 100 MG capsule Take 100 mg by mouth 3 (three) times daily.    [provider]  hydrochlorothiazide (HYDRODIURIL) 12.5 MG tablet Take 1 tablet (12.5 mg total) by mouth daily. 03/02/21   Horald Pollen, MD  hydrocortisone (ANUSOL-HC) 2.5 % rectal cream Place 1 application rectally 2 (two) times daily. 12/15/20   Horald Pollen, MD  hyoscyamine (LEVSIN SL) 0.125 MG SL tablet Place 1 tablet (0.125 mg total) under the tongue every 8 (eight) hours as needed. 08/20/19   Willia Craze, NP  ibuprofen (ADVIL) 600 MG tablet Take 1 tablet (600 mg total) by mouth every 6 (six) hours as needed. 06/03/20   Melynda Ripple, MD  ketoconazole (NIZORAL) 2 % shampoo APPLY EXTERNALLY 2 TIMES A WEEK. 11/25/17   [name], MD  lisinopril (ZESTRIL) 40 MG tablet Take 1 tablet (40 mg total) by mouth daily. 07/07/21   Horald Pollen, MD  mometasone (NASONEX) 50 MCG/ACT nasal spray Place 1-2 sprays into the nose daily as needed (nasal symptoms). 12/01/20   Garnet Sierras, DO  montelukast (SINGULAIR) 10 MG tablet TAKE 1 TABLET(10 MG) BY MOUTH AT BEDTIME 08/28/20   Lauraine Rinne, NP  Olopatadine HCl 0.2 % SOLN Apply 1 drop to eye daily as needed (itchy/watery eyes). 12/01/20   Garnet Sierras, DO  OTEZLA 30 MG TABS Take 1 tablet by mouth 2 (two) times daily. 05/18/20   [provider]  rosuvastatin (CRESTOR) 10 MG tablet Take 1 tablet (10 mg total) by mouth daily. 03/02/21   Horald Pollen, MD  sildenafil (VIAGRA) 100 MG tablet Take 0.5-1 tablets (50-100 mg total) by mouth daily as needed for erectile dysfunction. 07/09/21    Horald Pollen, MD    Family History Family History  Problem Relation Age of Onset   Stroke Mother    Asthma Sister    Colon cancer Neg Hx    Esophageal cancer Neg Hx    Stomach cancer Neg Hx    Prostate cancer Neg Hx    Diabetes Neg Hx    CAD Neg Hx    Migraines Neg Hx    Headache Neg Hx     Social History Social History   Tobacco Use   Smoking  status: Never   Smokeless tobacco: Never  Vaping Use   Vaping Use: Never used  Substance Use Topics   Alcohol use: Never    Alcohol/week: 0.0 standard drinks   Drug use: Never     Allergies   Patient has no known allergies.   Review of Systems Review of Systems  As stated above in HPI Physical Exam Triage Vital Signs ED Triage Vitals  Enc Vitals Group     BP 07/30/21 1532 (!) 138/92     Pulse Rate 07/30/21 1532 61     Resp 07/30/21 1532 18     Temp 07/30/21 1532 98.2 F (36.8 C)     Temp Source 07/30/21 1532 Oral     SpO2 07/30/21 1532 97 %     Weight --      Height --      Head Circumference --      Peak Flow --      Pain Score 07/30/21 1530 10     Pain Loc --      Pain Edu? --      Excl. in [place]? --    No data found.  Updated Vital Signs BP (!) 138/92 (BP Location: Left Arm)   Pulse 61   Temp 98.2 F (36.8 C) (Oral)   Resp 18   SpO2 97%   Physical Exam Vitals and nursing note reviewed.  Constitutional:      General: He is not in acute distress.    Appearance: Normal appearance. He is not ill-appearing, toxic-appearing or diaphoretic.  HENT:     Head: Normocephalic and atraumatic.  Eyes:     Extraocular Movements: Extraocular movements intact.     Pupils: Pupils are equal, round, and reactive to light.  Musculoskeletal:     Cervical back: Rigidity and tenderness (trapezius muscles and superior attachment site) present.  Neurological:     Mental Status: He is alert and oriented to person, place, and time.     Sensory: No sensory deficit.     Motor: No weakness.     Coordination:  Coordination normal.     UC Treatments / Results  Labs (all labs ordered are listed, but only abnormal results are displayed) Labs Reviewed - No data to display  EKG   Radiology DG Cervical Spine Complete  Result Date: 07/30/2021 CLINICAL DATA:  Fall.  Neck pain 3 weeks EXAM: CERVICAL SPINE - COMPLETE 4+ VIEW COMPARISON:  01/13/2016 FINDINGS: Normal alignment. No fracture or mass lesion. Prevertebral soft tissues normal. Mild anterior spurring C3 through C6. No significant foraminal encroachment. IMPRESSION: Mild cervical disc degeneration. Negative for foraminal encroachment. Negative for fracture. Electronically Signed   By: Franchot Gallo M.D.   On: 07/30/2021 16:23    Procedures Procedures (including critical care time)  Medications Ordered in UC Medications - No data to display  Initial Impression / Assessment and Plan / UC Course  I have reviewed the triage vital signs and the nursing notes.  Pertinent labs & imaging results that were available during my care of the patient were reviewed by me and considered in my medical decision making (see chart for details).     New. X ray pending.   UPDATE: Based on his x-rays I would recommend orthopedic follow-up.  In the meantime we are going to start him on low-dose prednisone and Flexeril.  We discussed how to use these medications along with common potential side effects and precautions.  He has been advised to monitor his blood pressure  closely as he knows that the prednisone can raise this.  We discussed red flag signs and symptoms.  Follow-up as needed. Final Clinical Impressions(s) / UC Diagnoses   Final diagnoses:  Neck pain  Musculoskeletal neck pain   Discharge Instructions   None    ED Prescriptions     Medication Sig Dispense Auth. Provider   cyclobenzaprine (FLEXERIL) 10 MG tablet Take 1 tablet (10 mg total) by mouth at bedtime. 30 tablet Cheney Gosch M, PA-C   predniSONE (DELTASONE) 10 MG tablet Take 4  tablets by mouth with breakfast for 2 days, 2 tablets by mouth for 2 days and 1 tablet by mouth for 2 days. 14 tablet Hughie Closs, [place]      PDMP not reviewed this encounter.   Hughie Closs, [place] 07/30/21 1637

## 2021-07-30 NOTE — ED Triage Notes (Signed)
Pt in with c/o posterior neck pain that he noticed 3 weeks ago.  States he has trouble moving his head side to side & up and down  Pt has been taking ibuprofen and tylenol for relief   States he also had a fall back in march and he noticed that he started having headaches more frequently after.  Pt requesting x ray

## 2021-08-05 DIAGNOSIS — J3089 Other allergic rhinitis: Secondary | ICD-10-CM

## 2021-08-05 NOTE — Progress Notes (Signed)
VIALS MADE. EXP 08-05-22

## 2021-08-10 DIAGNOSIS — M542 Cervicalgia: Secondary | ICD-10-CM | POA: Insufficient documentation

## 2021-08-19 ENCOUNTER — Ambulatory Visit (INDEPENDENT_AMBULATORY_CARE_PROVIDER_SITE_OTHER): Payer: BC Managed Care – PPO | Admitting: *Deleted

## 2021-08-19 DIAGNOSIS — J309 Allergic rhinitis, unspecified: Secondary | ICD-10-CM

## 2021-08-27 ENCOUNTER — Ambulatory Visit (INDEPENDENT_AMBULATORY_CARE_PROVIDER_SITE_OTHER): Payer: BC Managed Care – PPO

## 2021-08-27 DIAGNOSIS — J309 Allergic rhinitis, unspecified: Secondary | ICD-10-CM | POA: Diagnosis not present

## 2021-09-02 ENCOUNTER — Ambulatory Visit (INDEPENDENT_AMBULATORY_CARE_PROVIDER_SITE_OTHER): Payer: BC Managed Care – PPO

## 2021-09-02 DIAGNOSIS — J309 Allergic rhinitis, unspecified: Secondary | ICD-10-CM | POA: Diagnosis not present

## 2021-09-06 ENCOUNTER — Ambulatory Visit (INDEPENDENT_AMBULATORY_CARE_PROVIDER_SITE_OTHER): Payer: BC Managed Care – PPO | Admitting: *Deleted

## 2021-09-06 DIAGNOSIS — J309 Allergic rhinitis, unspecified: Secondary | ICD-10-CM | POA: Diagnosis not present

## 2021-09-07 ENCOUNTER — Other Ambulatory Visit: Payer: Self-pay | Admitting: Emergency Medicine

## 2021-09-09 MED ORDER — ROSUVASTATIN CALCIUM 10 MG PO TABS: 10.0000 mg | ORAL_TABLET | Freq: Every day | ORAL | 0 refills | Status: DC

## 2021-09-10 ENCOUNTER — Other Ambulatory Visit: Payer: Self-pay | Admitting: Orthopedic Surgery

## 2021-09-10 DIAGNOSIS — S63652A Sprain of metacarpophalangeal joint of right middle finger, initial encounter: Secondary | ICD-10-CM

## 2021-09-13 ENCOUNTER — Other Ambulatory Visit: Payer: Self-pay

## 2021-09-13 ENCOUNTER — Encounter: Payer: Self-pay | Admitting: Pulmonary Disease

## 2021-09-13 ENCOUNTER — Ambulatory Visit: Payer: BC Managed Care – PPO | Admitting: Pulmonary Disease

## 2021-09-13 ENCOUNTER — Ambulatory Visit (INDEPENDENT_AMBULATORY_CARE_PROVIDER_SITE_OTHER): Payer: BC Managed Care – PPO | Admitting: *Deleted

## 2021-09-13 VITALS — BP 128/58 | HR 66 | Temp 98.5°F | Ht 66.0 in | Wt 189.4 lb

## 2021-09-13 DIAGNOSIS — J309 Allergic rhinitis, unspecified: Secondary | ICD-10-CM

## 2021-09-13 DIAGNOSIS — Z23 Encounter for immunization: Secondary | ICD-10-CM | POA: Diagnosis not present

## 2021-09-13 DIAGNOSIS — J4540 Moderate persistent asthma, uncomplicated: Secondary | ICD-10-CM

## 2021-09-13 DIAGNOSIS — G4733 Obstructive sleep apnea (adult) (pediatric): Secondary | ICD-10-CM | POA: Diagnosis not present

## 2021-09-13 DIAGNOSIS — R918 Other nonspecific abnormal finding of lung field: Secondary | ICD-10-CM

## 2021-09-13 NOTE — Assessment & Plan Note (Signed)
This appears to have resolved after nasal septal surgery and UPPP

## 2021-09-13 NOTE — Assessment & Plan Note (Signed)
Stable and benign, no follow-up necessary

## 2021-09-13 NOTE — Assessment & Plan Note (Signed)
Mild persistent, triggered by seasonal allergies.  He is not using his MDIs anymore.  I have asked him to resume taking Singulair during the fall which seems to be his worst time.  Should wheezing or other symptoms occur, I have asked him to restart his inhalers

## 2021-09-13 NOTE — Patient Instructions (Signed)
Take singulair during fall

## 2021-09-13 NOTE — Progress Notes (Signed)
   Subjective:    Patient ID: Rickey Cruz, male    DOB: 1960/12/30, 60 y.o.   MRN: [id number]  HPI  60 yo originally from [place], for FU of pulmonary nodules ,OSA & mild persistent asthma      PMH -small calcified and noncalcified lung nodules on CT scan 02/02/2010. 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  Follow-up scans were stable in 2013 and last chest x-ray from 08/2014 showed calcified right upper lobe nodule  He was on CPAP of 14 cm at one time but in 2012 was decreased to CPAP of 9 cm  He moved to [place] and underwent nasal septal surgery and UPPP in 2014.  We repeated sleep study which showed that OSA had resolved   He typically has flares of his allergic rhinitis with seasonal changes especially in fall.  He has been getting allergen immunotherapy from his allergist but now has stopped.  He is also stopped taking his Qvar and albuterol. He has been diagnosed with psoriasis but stopped taking Otezla since it did not make a difference to his skin lesions  Chest x-ray 07/2019 appears clear  Significant tests/ events reviewed 08/2019 NPSG >> no significant events, TST 6 hours  05/2012 small pulmonary nodule stable from 2011, likely granulomas including calcified right upper lobe  Spirometry 03/2021 normal  Review of Systems neg for any significant sore throat, dysphagia, itching, sneezing, nasal congestion or excess/ purulent secretions, fever, chills, sweats, unintended wt loss, pleuritic or exertional cp, hempoptysis, orthopnea pnd or change in chronic leg swelling. Also denies presyncope, palpitations, heartburn, abdominal pain, nausea, vomiting, diarrhea or change in bowel or urinary habits, dysuria,hematuria, rash, arthralgias, visual complaints, headache, numbness weakness or ataxia.     Objective:   Physical Exam  Gen. Pleasant, well-nourished, in no distress ENT - no thrush, no pallor/icterus,no post nasal  drip Neck: No JVD, no thyromegaly, no carotid bruits Lungs: no use of accessory muscles, no dullness to percussion, clear without rales or rhonchi  Cardiovascular: Rhythm regular, heart sounds  normal, no murmurs or gallops, no peripheral edema Musculoskeletal: No deformities, no cyanosis or clubbing        Assessment & Plan:

## 2021-09-19 ENCOUNTER — Encounter (HOSPITAL_COMMUNITY): Payer: Self-pay | Admitting: *Deleted

## 2021-09-19 ENCOUNTER — Emergency Department (HOSPITAL_COMMUNITY)
Admission: EM | Admit: 2021-09-19 | Discharge: 2021-09-20 | Disposition: A | Discharge status: Home / Self Care | Payer: BC Managed Care – PPO | Attending: Family Medicine | Admitting: Family Medicine

## 2021-09-19 ENCOUNTER — Encounter (HOSPITAL_COMMUNITY): Payer: Self-pay | Admitting: Emergency Medicine

## 2021-09-19 ENCOUNTER — Other Ambulatory Visit: Payer: Self-pay

## 2021-09-19 ENCOUNTER — Ambulatory Visit (HOSPITAL_COMMUNITY)
Admission: EM | Admit: 2021-09-19 | Discharge: 2021-09-19 | Disposition: A | Discharge status: Home / Self Care | Payer: BC Managed Care – PPO | Source: Home / Self Care

## 2021-09-19 DIAGNOSIS — I10 Essential (primary) hypertension: Secondary | ICD-10-CM | POA: Insufficient documentation

## 2021-09-19 DIAGNOSIS — Z7951 Long term (current) use of inhaled steroids: Secondary | ICD-10-CM | POA: Insufficient documentation

## 2021-09-19 DIAGNOSIS — R300 Dysuria: Secondary | ICD-10-CM | POA: Insufficient documentation

## 2021-09-19 DIAGNOSIS — Z79899 Other long term (current) drug therapy: Secondary | ICD-10-CM | POA: Insufficient documentation

## 2021-09-19 DIAGNOSIS — N410 Acute prostatitis: Secondary | ICD-10-CM

## 2021-09-19 DIAGNOSIS — R309 Painful micturition, unspecified: Secondary | ICD-10-CM | POA: Diagnosis present

## 2021-09-19 DIAGNOSIS — J4540 Moderate persistent asthma, uncomplicated: Secondary | ICD-10-CM | POA: Diagnosis not present

## 2021-09-19 LAB — POCT URINALYSIS DIPSTICK, ED / UC
Glucose, UA: NEGATIVE mg/dL
Ketones, ur: 15 mg/dL — AB
Nitrite: NEGATIVE
Protein, ur: 100 mg/dL — AB
Specific Gravity, Urine: 1.025 (ref 1.005–1.030)
Urobilinogen, UA: 1 mg/dL (ref 0.0–1.0)
pH: 6 (ref 5.0–8.0)

## 2021-09-19 LAB — CBC WITH DIFFERENTIAL/PLATELET
Abs Immature Granulocytes: 0.17 10*3/uL — ABNORMAL HIGH (ref 0.00–0.07)
Basophils Absolute: 0 10*3/uL (ref 0.0–0.1)
Basophils Relative: 0 %
Eosinophils Absolute: 0 10*3/uL (ref 0.0–0.5)
Eosinophils Relative: 0 %
HCT: 47 % (ref 39.0–52.0)
Hemoglobin: 16.4 g/dL (ref 13.0–17.0)
Immature Granulocytes: 2 %
Lymphocytes Relative: 8 %
Lymphs Abs: 0.8 10*3/uL (ref 0.7–4.0)
MCH: 30.5 pg (ref 26.0–34.0)
MCHC: 34.9 g/dL (ref 30.0–36.0)
MCV: 87.4 fL (ref 80.0–100.0)
Monocytes Absolute: 1.3 10*3/uL — ABNORMAL HIGH (ref 0.1–1.0)
Monocytes Relative: 13 %
Neutro Abs: 7.6 10*3/uL (ref 1.7–7.7)
Neutrophils Relative %: 77 %
Platelets: 173 10*3/uL (ref 150–400)
RBC: 5.38 MIL/uL (ref 4.22–5.81)
RDW: 12.2 % (ref 11.5–15.5)
WBC: 9.9 10*3/uL (ref 4.0–10.5)
nRBC: 0 % (ref 0.0–0.2)

## 2021-09-19 LAB — COMPREHENSIVE METABOLIC PANEL
ALT: 129 U/L — ABNORMAL HIGH (ref 0–44)
AST: 61 U/L — ABNORMAL HIGH (ref 15–41)
Albumin: 3.2 g/dL — ABNORMAL LOW (ref 3.5–5.0)
Alkaline Phosphatase: 75 U/L (ref 38–126)
Anion gap: 10 (ref 5–15)
BUN: 18 mg/dL (ref 6–20)
CO2: 23 mmol/L (ref 22–32)
Calcium: 8.9 mg/dL (ref 8.9–10.3)
Chloride: 103 mmol/L (ref 98–111)
Creatinine, Ser: 1.02 mg/dL (ref 0.61–1.24)
GFR, Estimated: >60 mL/min (ref >60)
Glucose, Bld: 134 mg/dL — ABNORMAL HIGH (ref 70–99)
Potassium: 3.4 mmol/L — ABNORMAL LOW (ref 3.5–5.1)
Sodium: 136 mmol/L (ref 135–145)
Total Bilirubin: 1.1 mg/dL (ref 0.3–1.2)
Total Protein: 7 g/dL (ref 6.5–8.1)

## 2021-09-19 MED ORDER — KETOROLAC TROMETHAMINE 30 MG/ML IJ SOLN
INTRAMUSCULAR | Status: AC
  Filled 2021-09-19: qty 1

## 2021-09-19 MED ORDER — CEFTRIAXONE SODIUM 500 MG IJ SOLR
INTRAMUSCULAR | Status: AC
  Filled 2021-09-19: qty 500

## 2021-09-19 MED ORDER — KETOROLAC TROMETHAMINE 30 MG/ML IJ SOLN
30.0000 mg | Freq: Once | INTRAMUSCULAR | Status: AC
Start: 2021-09-19 — End: 2021-09-19
  Administered 2021-09-19: 30 mg via INTRAMUSCULAR

## 2021-09-19 MED ORDER — SULFAMETHOXAZOLE-TRIMETHOPRIM 800-160 MG PO TABS: 1.0000 | ORAL_TABLET | Freq: Two times a day (BID) | ORAL | 0 refills | Status: AC

## 2021-09-19 MED ORDER — LIDOCAINE HCL (PF) 1 % IJ SOLN
INTRAMUSCULAR | Status: AC
  Filled 2021-09-19: qty 30

## 2021-09-19 MED ORDER — CEFTRIAXONE SODIUM 500 MG IJ SOLR
500.0000 mg | Freq: Once | INTRAMUSCULAR | Status: AC
  Administered 2021-09-19: 500 mg via INTRAMUSCULAR

## 2021-09-19 NOTE — Discharge Instructions (Signed)
Follow-up with your urologist first thing tomorrow morning.  Go to the emergency department if your symptoms worsen in any time.

## 2021-09-19 NOTE — ED Provider Notes (Signed)
Emergency Medicine Provider Triage Evaluation Note  Rickey Cruz , a 60 y.o. male  was evaluated in triage.  Pt complains of urinary retention.  Patient went to urgent care earlier today for same, was diagnosed with UTI, given antibiotics for same.  Was told to return to the ER if he was unable to urinate, has been voiding only a few drops since 7 PM tonight.  Denies any hematuria or abdominal pain.  Feels as though he needs to urinate but only voids a few drops at a time.   Review of Systems  Positive: Urinary retention, dysuria Negative: Fevers, chills, hematuria  Physical Exam  BP (!) 139/96   Pulse 86   Temp 98.7 F (37.1 C) (Oral)   Resp 16   Ht 5\' 6"  (1.676 m)   Wt 81.6 kg   SpO2 100%   BMI 29.05 kg/m  Gen:   Awake, no distress   Resp:  Normal effort  MSK:   Moves extremities without difficulty  Other:  No CVA tenderness  Medical Decision Making  Medically screening exam initiated at 9:22 PM.  Appropriate orders placed.  Rickey Cruz was informed that the remainder of the evaluation will be completed by another provider, this initial triage assessment does not replace that evaluation, and the importance of remaining in the ED until their evaluation is complete.     Nestor Lewandowsky 09/19/21 2127    Davonna Belling, MD 09/20/21 [phone]

## 2021-09-19 NOTE — ED Triage Notes (Signed)
The pt was seen at [facility] earlier today and was diagnosed with a uti  he was told to come to the ed if he was unable to urinate.  He is having difficulty urinating  only can get a few drops out occassionally

## 2021-09-19 NOTE — ED Provider Notes (Signed)
Rickey Cruz    CSN: [id number] Arrival date & time: 09/19/21  1119      History   Chief Complaint Chief Complaint  Patient presents with   Dysuria   Urinary Retention    HPI Rickey Cruz is a 60 y.o. male.   Patient presenting today with over a week of progressive symptoms including painful urination, urinary hesitancy, rectal fullness and tenderness worse with movement, chills and sweats.  Still able to pass urine well and denies fevers, abdominal pain, vomiting, bowel changes.  Followed by urology for enlarged prostate with obstructive symptoms.  Has not tried anything over-the-counter for symptoms thus far.  Past Medical History:  Diagnosis Date   Allergy    Flonase, Patanol, Zyrtec   Arthritis    psoriasis   Asthma    seasonal, with allergies   Borderline diabetes    Cluster headache    Colon polyp 10/27/2011   Depression    denies 11/11/16; denies 11/21/18 "been years"   Eczema    GERD (gastroesophageal reflux disease)    History of kidney stones    x1   Hyperlipidemia    Hypertension    Low back pain    "in kidney area-was told that it was related to gallstones"   Prediabetes    Psoriasis    [name]   Severe sleep apnea    h/o; had procedure and weight control, does not have to wear CPAP   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, 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    Patient Active Problem List   Diagnosis Date Noted   Seasonal and perennial allergic rhinoconjunctivitis 04/05/2021   Moderate persistent asthma without complication 36/62/9476   Prediabetes 04/02/2019   Hyperlipidemia 12/23/2014   Psoriasis 06/27/2014   Multiple lung nodules 12/09/2011   PULMONARY HYPERTENSION 03/04/2010   Essential hypertension 02/10/2010   Asthma in adult 02/10/2010   OBSTRUCTIVE SLEEP APNEA 02/04/2010    Past Surgical History:  Procedure Laterality Date   APPENDECTOMY     CHOLECYSTECTOMY N/A 12/16/2014   Procedure: LAPAROSCOPIC CHOLECYSTECTOMY WITH  INTRAOPERATIVE CHOLANGIOGRAM;  Surgeon: [name], MD;  Location: [facility];  Service: General;  Laterality: N/A;   COLONOSCOPY  10/27/2011   single polyp. Repeat 5 years.   POLYPECTOMY     PROSTATE BIOPSY  10/2014   will have another in 3 months   SEPTOPLASTY  2014   TONSILLECTOMY  2014   UVULOPALATOPHARYNGOPLASTY  2014       Home Medications    Prior to Admission medications   Medication Sig Start Date End Date Taking? Authorizing Provider  sulfamethoxazole-trimethoprim (BACTRIM DS) 800-160 MG tablet Take 1 tablet by mouth 2 (two) times daily for 14 days. 09/19/21 10/03/21 Yes Volney American, PA-C  albuterol (VENTOLIN HFA) 108 (90 Base) MCG/ACT inhaler Inhale 2 puffs into the lungs every 4 (four) hours as needed for wheezing or shortness of breath (coughing fits). 12/01/20   Garnet Sierras, DO  amLODipine (NORVASC) 5 MG tablet Take 1 tablet (5 mg total) by mouth daily. 12/15/20   Horald Pollen, MD  aspirin EC 325 MG tablet Take 325 mg by mouth as needed.    [provider]  clobetasol (OLUX) 0.05 % topical foam APPLY EXTERNALLY TO THE AFFECTED AREA TWICE DAILY 03/31/20   Jacelyn Pi, Lilia Argue, MD  clobetasol ointment (TEMOVATE) 0.05 % APPLY EXTERNALLY TO THE AFFECTED AREA TWICE DAILY 04/17/20   Jacelyn Pi, Lilia Argue, MD  EPINEPHrine (AUVI-Q) 0.3 mg/0.3 mL  IJ SOAJ injection Inject 0.3 mg into the muscle as needed for anaphylaxis. Patient not taking: Reported on 09/13/2021 12/01/20   Garnet Sierras, DO  fluocinolone (VANOS) 0.01 % cream Apply topically daily. 12/06/19   Jacelyn Pi, Lilia Argue, MD  fluocinonide cream (LIDEX) 0.05 % APPLY SPARINGLY EXTERNALLY TO THE AFFECTED AREA TWICE DAILY 02/02/20   Jacelyn Pi, Lilia Argue, MD  Fluticasone Furoate (ARNUITY ELLIPTA) 100 MCG/ACT AEPB Inhale 1 puff into the lungs daily. Rinse mouth after each use. 04/05/21   Garnet Sierras, DO  gabapentin (NEURONTIN) 100 MG capsule Take 100 mg by mouth 3 (three) times daily.    [provider]   hydrocortisone (ANUSOL-HC) 2.5 % rectal cream Place 1 application rectally 2 (two) times daily. 12/15/20   Horald Pollen, MD  hyoscyamine (LEVSIN SL) 0.125 MG SL tablet Place 1 tablet (0.125 mg total) under the tongue every 8 (eight) hours as needed. 08/20/19   Willia Craze, NP  ibuprofen (ADVIL) 600 MG tablet Take 1 tablet (600 mg total) by mouth every 6 (six) hours as needed. 06/03/20   Melynda Ripple, MD  ketoconazole (NIZORAL) 2 % shampoo APPLY EXTERNALLY 2 TIMES A WEEK. 11/25/17   [name], MD  lisinopril (ZESTRIL) 40 MG tablet Take 1 tablet (40 mg total) by mouth daily. 07/07/21   Horald Pollen, MD  mometasone (NASONEX) 50 MCG/ACT nasal spray Place 1-2 sprays into the nose daily as needed (nasal symptoms). 12/01/20   Garnet Sierras, DO  montelukast (SINGULAIR) 10 MG tablet TAKE 1 TABLET(10 MG) BY MOUTH AT BEDTIME 08/28/20   Lauraine Rinne, NP  Olopatadine HCl 0.2 % SOLN Apply 1 drop to eye daily as needed (itchy/watery eyes). Patient not taking: Reported on 09/13/2021 12/01/20   Garnet Sierras, DO  rosuvastatin (CRESTOR) 10 MG tablet Take 1 tablet (10 mg total) by mouth daily. 09/09/21   Horald Pollen, MD  sildenafil (VIAGRA) 100 MG tablet Take 0.5-1 tablets (50-100 mg total) by mouth daily as needed for erectile dysfunction. Patient not taking: Reported on 09/13/2021 07/09/21   Horald Pollen, MD    Family History Family History  Problem Relation Age of Onset   Stroke Mother    Asthma Sister    Colon cancer Neg Hx    Esophageal cancer Neg Hx    Stomach cancer Neg Hx    Prostate cancer Neg Hx    Diabetes Neg Hx    CAD Neg Hx    Migraines Neg Hx    Headache Neg Hx     Social History Social History   Tobacco Use   Smoking status: Never   Smokeless tobacco: Never  Vaping Use   Vaping Use: Never used  Substance Use Topics   Alcohol use: Never    Alcohol/week: 0.0 standard drinks   Drug use: Never     Allergies   Patient has no known  allergies.   Review of Systems Review of Systems Per HPI  Physical Exam Triage Vital Signs ED Triage Vitals  Enc Vitals Group     BP 09/19/21 1221 131/85     Pulse Rate 09/19/21 1221 94     Resp 09/19/21 1221 (!) 22     Temp 09/19/21 1221 98.7 F (37.1 C)     Temp Source 09/19/21 1221 Oral     SpO2 09/19/21 1221 97 %     Weight --      Height --      Head  Circumference --      Peak Flow --      Pain Score 09/19/21 1318 10     Pain Loc --      Pain Edu? --      Excl. in [place]? --    No data found.  Updated Vital Signs BP 131/85 (BP Location: Left Arm)   Pulse 94   Temp 98.7 F (37.1 C) (Oral)   Resp (!) 22   SpO2 97%   Visual Acuity Right Eye Distance:   Left Eye Distance:   Bilateral Distance:    Right Eye Near:   Left Eye Near:    Bilateral Near:     Physical Exam Vitals and nursing note reviewed.  Constitutional:      Comments: Appears to be in a lot of pain  HENT:     Head: Atraumatic.     Mouth/Throat:     Mouth: Mucous membranes are moist.  Eyes:     Extraocular Movements: Extraocular movements intact.     Conjunctiva/sclera: Conjunctivae normal.  Cardiovascular:     Rate and Rhythm: Normal rate.  Pulmonary:     Effort: Pulmonary effort is normal. No respiratory distress.  Genitourinary:    Comments: GU exam deferred to urology Musculoskeletal:        General: Normal range of motion.     Cervical back: Normal range of motion and neck supple.  Skin:    General: Skin is warm and dry.  Neurological:     Mental Status: He is oriented to person, place, and time.     Motor: No weakness.     Gait: Gait normal.  Psychiatric:        Mood and Affect: Mood normal.        Thought Content: Thought content normal.        Judgment: Judgment normal.     UC Treatments / Results  Labs (all labs ordered are listed, but only abnormal results are displayed) Labs Reviewed  CBC WITH DIFFERENTIAL/PLATELET - Abnormal; Notable for the following components:       Result Value   Monocytes Absolute 1.3 (*)    Abs Immature Granulocytes 0.17 (*)    All other components within normal limits  POCT URINALYSIS DIPSTICK, ED / UC - Abnormal; Notable for the following components:   Bilirubin Urine SMALL (*)    Ketones, ur 15 (*)    Hgb urine dipstick MODERATE (*)    Protein, ur 100 (*)    Leukocytes,Ua MODERATE (*)    All other components within normal limits  URINE CULTURE  COMPREHENSIVE METABOLIC PANEL    EKG   Radiology No results found.  Procedures Procedures (including critical care time)  Medications Ordered in UC Medications  ketorolac (TORADOL) 30 MG/ML injection 30 mg (30 mg Intramuscular Given 09/19/21 1316)  cefTRIAXone (ROCEPHIN) injection 500 mg (500 mg Intramuscular Given 09/19/21 1313)    Initial Impression / Assessment and Plan / UC Course  I have reviewed the triage vital signs and the nursing notes.  Pertinent labs & imaging results that were available during my care of the patient were reviewed by me and considered in my medical decision making (see chart for details).     Vital signs stable, urinalysis showing a urinary tract infection, likely prostatitis given his symptoms.  We will treat with 2 weeks of Bactrim, IM Rocephin and IM Toradol in clinic and sent for a urine culture.  We will also obtain CBC and CMP for  safety check.  He plans to call his urologist first thing tomorrow morning to get a follow-up appointment and have further evaluation if needed.  Discussed ED precautions for worsening symptoms, particularly inability to pass urine.  Final Clinical Impressions(s) / UC Diagnoses   Final diagnoses:  Acute prostatitis     Discharge Instructions      Follow-up with your urologist first thing tomorrow morning.  Go to the emergency department if your symptoms worsen in any time.     ED Prescriptions     Medication Sig Dispense Auth. Provider   sulfamethoxazole-trimethoprim (BACTRIM DS) 800-160 MG  tablet Take 1 tablet by mouth 2 (two) times daily for 14 days. 28 tablet Volney American, [place]      PDMP not reviewed this encounter.   Volney American, [place] 09/19/21 1424

## 2021-09-19 NOTE — ED Triage Notes (Signed)
Pt reports a week ago had burning to tip of penis which is still going on. Got erection out of now where that was painful but eventually went down. Has enlarged prostate and felt anal pressure.  Since Thursday having painful urination, trouble getting urine to come out and testicles feel swollen and heavy. Pt also adds had a lot of ssweating  Has urologist but doesn't see again until November.

## 2021-09-20 LAB — URINALYSIS, ROUTINE W REFLEX MICROSCOPIC
Bacteria, UA: NONE SEEN
Bilirubin Urine: NEGATIVE
Glucose, UA: NEGATIVE mg/dL
Ketones, ur: 20 mg/dL — AB
Nitrite: NEGATIVE
Protein, ur: 100 mg/dL — AB
Specific Gravity, Urine: 1.023 (ref 1.005–1.030)
WBC, UA: >50 WBC/hpf — ABNORMAL HIGH (ref 0–5)
pH: 6 (ref 5.0–8.0)

## 2021-09-20 LAB — URINE CULTURE: Culture: NO GROWTH

## 2021-09-20 MED ORDER — PHENAZOPYRIDINE HCL 100 MG PO TABS
95.0000 mg | ORAL_TABLET | Freq: Once | ORAL | Status: AC
  Administered 2021-09-20: 100 mg via ORAL
  Filled 2021-09-20: qty 1

## 2021-09-20 MED ORDER — ACETAMINOPHEN 500 MG PO TABS
1000.0000 mg | ORAL_TABLET | Freq: Once | ORAL | Status: AC
  Administered 2021-09-20: 1000 mg via ORAL
  Filled 2021-09-20 (×2): qty 2

## 2021-09-20 MED ORDER — PHENAZOPYRIDINE HCL 95 MG PO TABS: 95.0000 mg | ORAL_TABLET | Freq: Three times a day (TID) | ORAL | 0 refills | Status: DC | PRN

## 2021-09-20 NOTE — ED Notes (Signed)
Bladder scanned PT with a result of 134 mL

## 2021-09-20 NOTE — Discharge Instructions (Addendum)
Labs from earlier were normal. Continue the antibiotics from urgent care.  Can use the pyridium I have prescribed to try and help with burning with urination and urgency. Follow-up with Dr. Tresa Moore-- call his office in the morning to arrange follow-up. Return here for new concerns.

## 2021-09-20 NOTE — ED Provider Notes (Signed)
[facility] Provider Note   CSN: [id number] Arrival date & time: 09/19/21  2000     History Chief Complaint  Patient presents with   Urinary Retention    Arif Amendola is a 60 y.o. male.  The history is provided by the patient and medical records.   60 year old male with history of GERD, depression, asthma, seasonal allergies, prediabetes, hypertension, hyperlipidemia, presenting to the ED for difficulty urinating.  States he has been having trouble intermittently all week but got worse today.  He was seen at urgent care and treated for UTI/prostatitis with IM Rocephin and prescribed 14 day course of bactrim.  He did have blood work done that was reassuring.  He was told if worsening difficulty urinating to come to the ED.  States since 7 PM he has only been able to force out a few drops and continues to feel the urge to urinate.  He denies any fever but does report some chills.  He does report fullness sensation in his "bottom" but is able to have normal BM.  He does have urologist that he sees regularly.  Past Medical History:  Diagnosis Date   Allergy    Flonase, Patanol, Zyrtec   Arthritis    psoriasis   Asthma    seasonal, with allergies   Borderline diabetes    Cluster headache    Colon polyp 10/27/2011   Depression    denies 11/11/16; denies 11/21/18 "been years"   Eczema    GERD (gastroesophageal reflux disease)    History of kidney stones    x1   Hyperlipidemia    Hypertension    Low back pain    "in kidney area-was told that it was related to gallstones"   Prediabetes    Psoriasis    [name]   Severe sleep apnea    h/o; had procedure and weight control, does not have to wear CPAP   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, 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    Patient Active Problem List   Diagnosis Date Noted   Seasonal and perennial allergic rhinoconjunctivitis 04/05/2021   Moderate persistent asthma without complication  92/42/6834   Prediabetes 04/02/2019   Hyperlipidemia 12/23/2014   Psoriasis 06/27/2014   Multiple lung nodules 12/09/2011   PULMONARY HYPERTENSION 03/04/2010   Essential hypertension 02/10/2010   Asthma in adult 02/10/2010   OBSTRUCTIVE SLEEP APNEA 02/04/2010    Past Surgical History:  Procedure Laterality Date   APPENDECTOMY     CHOLECYSTECTOMY N/A 12/16/2014   Procedure: LAPAROSCOPIC CHOLECYSTECTOMY WITH INTRAOPERATIVE CHOLANGIOGRAM;  Surgeon: [name], MD;  Location: [facility];  Service: General;  Laterality: N/A;   COLONOSCOPY  10/27/2011   single polyp. Repeat 5 years.   POLYPECTOMY     PROSTATE BIOPSY  10/2014   will have another in 3 months   SEPTOPLASTY  2014   TONSILLECTOMY  2014   UVULOPALATOPHARYNGOPLASTY  2014       Family History  Problem Relation Age of Onset   Stroke Mother    Asthma Sister    Colon cancer Neg Hx    Esophageal cancer Neg Hx    Stomach cancer Neg Hx    Prostate cancer Neg Hx    Diabetes Neg Hx    CAD Neg Hx    Migraines Neg Hx    Headache Neg Hx     Social History   Tobacco Use   Smoking status: Never   Smokeless tobacco: Never  Vaping  Use   Vaping Use: Never used  Substance Use Topics   Alcohol use: Never    Alcohol/week: 0.0 standard drinks   Drug use: Never    Home Medications Prior to Admission medications   Medication Sig Start Date End Date Taking? Authorizing Provider  albuterol (VENTOLIN HFA) 108 (90 Base) MCG/ACT inhaler Inhale 2 puffs into the lungs every 4 (four) hours as needed for wheezing or shortness of breath (coughing fits). 12/01/20   Garnet Sierras, DO  amLODipine (NORVASC) 5 MG tablet Take 1 tablet (5 mg total) by mouth daily. 12/15/20   Horald Pollen, MD  aspirin EC 325 MG tablet Take 325 mg by mouth as needed.    [provider]  clobetasol (OLUX) 0.05 % topical foam APPLY EXTERNALLY TO THE AFFECTED AREA TWICE DAILY 03/31/20   Jacelyn Pi, Lilia Argue, MD  clobetasol ointment (TEMOVATE) 0.05 %  APPLY EXTERNALLY TO THE AFFECTED AREA TWICE DAILY 04/17/20   Jacelyn Pi, Lilia Argue, MD  EPINEPHrine (AUVI-Q) 0.3 mg/0.3 mL IJ SOAJ injection Inject 0.3 mg into the muscle as needed for anaphylaxis. Patient not taking: Reported on 09/13/2021 12/01/20   Garnet Sierras, DO  fluocinolone (VANOS) 0.01 % cream Apply topically daily. 12/06/19   Jacelyn Pi, Lilia Argue, MD  fluocinonide cream (LIDEX) 0.05 % APPLY SPARINGLY EXTERNALLY TO THE AFFECTED AREA TWICE DAILY 02/02/20   Jacelyn Pi, Lilia Argue, MD  Fluticasone Furoate (ARNUITY ELLIPTA) 100 MCG/ACT AEPB Inhale 1 puff into the lungs daily. Rinse mouth after each use. 04/05/21   Garnet Sierras, DO  gabapentin (NEURONTIN) 100 MG capsule Take 100 mg by mouth 3 (three) times daily.    [provider]  hydrocortisone (ANUSOL-HC) 2.5 % rectal cream Place 1 application rectally 2 (two) times daily. 12/15/20   Horald Pollen, MD  hyoscyamine (LEVSIN SL) 0.125 MG SL tablet Place 1 tablet (0.125 mg total) under the tongue every 8 (eight) hours as needed. 08/20/19   Willia Craze, NP  ibuprofen (ADVIL) 600 MG tablet Take 1 tablet (600 mg total) by mouth every 6 (six) hours as needed. 06/03/20   Melynda Ripple, MD  ketoconazole (NIZORAL) 2 % shampoo APPLY EXTERNALLY 2 TIMES A WEEK. 11/25/17   [name], MD  lisinopril (ZESTRIL) 40 MG tablet Take 1 tablet (40 mg total) by mouth daily. 07/07/21   Horald Pollen, MD  mometasone (NASONEX) 50 MCG/ACT nasal spray Place 1-2 sprays into the nose daily as needed (nasal symptoms). 12/01/20   Garnet Sierras, DO  montelukast (SINGULAIR) 10 MG tablet TAKE 1 TABLET(10 MG) BY MOUTH AT BEDTIME 08/28/20   Lauraine Rinne, NP  Olopatadine HCl 0.2 % SOLN Apply 1 drop to eye daily as needed (itchy/watery eyes). Patient not taking: Reported on 09/13/2021 12/01/20   Garnet Sierras, DO  rosuvastatin (CRESTOR) 10 MG tablet Take 1 tablet (10 mg total) by mouth daily. 09/09/21   Horald Pollen, MD  sildenafil (VIAGRA) 100 MG tablet  Take 0.5-1 tablets (50-100 mg total) by mouth daily as needed for erectile dysfunction. Patient not taking: Reported on 09/13/2021 07/09/21   Horald Pollen, MD  sulfamethoxazole-trimethoprim (BACTRIM DS) 800-160 MG tablet Take 1 tablet by mouth 2 (two) times daily for 14 days. 09/19/21 10/03/21  Volney American, PA-C    Allergies    Patient has no known allergies.  Review of Systems   Review of Systems  Genitourinary:  Positive for difficulty urinating.  All other systems reviewed  and are negative.  Physical Exam Updated Vital Signs BP (!) 140/94   Pulse 72   Temp 98.7 F (37.1 C) (Oral)   Resp 16   Ht 5\' 6"  (1.676 m)   Wt 81.6 kg   SpO2 98%   BMI 29.05 kg/m   Physical Exam Vitals and nursing note reviewed.  Constitutional:      Appearance: He is well-developed.  HENT:     Head: Normocephalic and atraumatic.  Eyes:     Conjunctiva/sclera: Conjunctivae normal.     Pupils: Pupils are equal, round, and reactive to light.  Cardiovascular:     Rate and Rhythm: Normal rate and regular rhythm.     Heart sounds: Normal heart sounds.  Pulmonary:     Effort: Pulmonary effort is normal. No respiratory distress.     Breath sounds: Normal breath sounds. No rhonchi.  Abdominal:     General: Bowel sounds are normal.     Palpations: Abdomen is soft.     Comments: Fullness over bladder  Musculoskeletal:        General: Normal range of motion.     Cervical back: Normal range of motion.  Skin:    General: Skin is warm and dry.  Neurological:     Mental Status: He is alert and oriented to person, place, and time.    ED Results / Procedures / Treatments   Labs (all labs ordered are listed, but only abnormal results are displayed) Labs Reviewed  URINALYSIS, ROUTINE W REFLEX MICROSCOPIC - Abnormal; Notable for the following components:      Result Value   APPearance HAZY (*)    Hgb urine dipstick MODERATE (*)    Ketones, ur 20 (*)    Protein, ur 100 (*)     Leukocytes,Ua SMALL (*)    WBC, UA >50 (*)    Non Squamous Epithelial 0-5 (*)    All other components within normal limits    EKG None  Radiology No results found.  Procedures Procedures   Medications Ordered in ED Medications - No data to display  ED Course  I have reviewed the triage vital signs and the nursing notes.  Pertinent labs & imaging results that were available during my care of the patient were reviewed by me and considered in my medical decision making (see chart for details).    MDM Rules/Calculators/A&P                           61 year old male presenting to the ED with difficulty urinating.  He was seen at urgent care earlier today and treated for suspected prostatitis/UTI with IM Rocephin followed by 14-day course of Bactrim.  He did have blood work done that was overall reassuring with normal white count and normal renal function.  He returns to the ED due to continued difficulty urinating, now only able to squeeze out a few drops since 7 PM.  Reports continued urge to urinate and dysuria when urine does pass.  He is afebrile and nontoxic but does appear uncomfortable.  He does have some fullness over the bladder.  Able to urinate some here for specimen sent.  Culture from this AM still in process.  Will obtain bladder scan to check post-void residual.  1:33 AM Bladder scan 134.  He was able to urinate a few more times here in the ED, maneuvered a bit, rubbed his abdomen which seemed to produce more urine.  He does appear  more comfortable now and no bladder fullness noted on repeat exam.  We have reviewed his labs from earlier and treatment from UC as he was a little confused about what he received and ultimate diagnosis.  Urine culture still in process from earlier today.  He will need to continue oral antibiotics, will add azo for a few days as he is complainig of fairly significant dysuria and urgency.  He will call his urologist, Dr. Tresa Moore in the morning to  expedite follow-up.  Return here for new concerns.  Final Clinical Impression(s) / ED Diagnoses Final diagnoses:  Dysuria    Rx / DC Orders ED Discharge Orders          Ordered    phenazopyridine (PYRIDIUM) 95 MG tablet  3 times daily PRN        09/20/21 0137             Larene Pickett, PA-C 09/20/21 0154    Maudie Flakes, MD 09/20/21 [id number]

## 2021-09-21 ENCOUNTER — Ambulatory Visit
Admission: RE | Admit: 2021-09-21 | Discharge: 2021-09-21 | Disposition: A | Discharge status: Home / Self Care | Payer: BC Managed Care – PPO | Source: Ambulatory Visit | Attending: Orthopedic Surgery | Admitting: Orthopedic Surgery

## 2021-09-21 ENCOUNTER — Other Ambulatory Visit: Payer: Self-pay

## 2021-09-21 DIAGNOSIS — S63652A Sprain of metacarpophalangeal joint of right middle finger, initial encounter: Secondary | ICD-10-CM

## 2021-09-21 IMAGING — MR MR [PERSON_NAME]*[PERSON_NAME]* W/CM
8 series · 40 of 40 positions shown · IV contrast (agent unspecified)
Comparison: X-ray [DATE]

CLINICAL DATA: Right long finger pain and swelling since [DATE]

EXAM:
MRI OF THE RIGHT FINGERS WITH CONTRAST (MR Arthrogram)
TECHNIQUE: Multiplanar, multisequence MR imaging of the right third MCP joint
was performed following intra-articular contrast injection under
fluoroscopic guidance. No intravenous contrast was administered.

[Series 6: T1 fat-sat · axial · right · 3.0mm · 0.62mm/px · z∈[-11,+77]mm · 7 of 37 slices shown (1 of 4)]
[im 1/37]
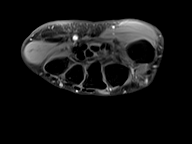
[im 7/37]
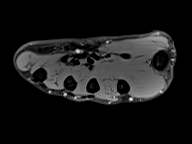
[im 13/37]
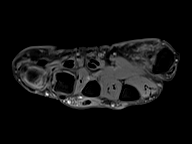
[im 19/37]
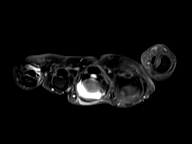
[im 25/37]
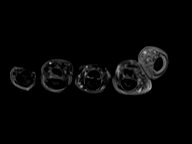
[im 31/37]
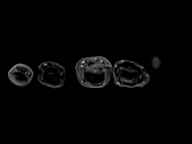
[im 37/37]
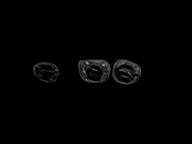

[Series 7: T2 fat-sat · axial · right · 3.0mm · 0.62mm/px · z∈[-11,+77]mm · 6 of 37 slices shown (1 of 2)]
[im 1/37]
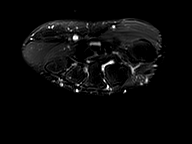
[im 8/37]
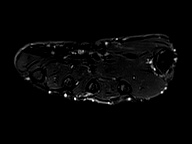
[im 15/37]
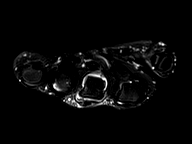
[im 22/37]
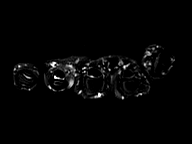
[im 29/37]
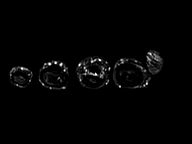
[im 37/37]
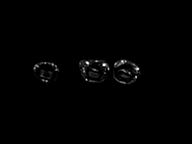

[Series 8: T1 · coronal · right · 1.5mm · 0.53mm/px · 4 of 26 slices shown]
[im 1/26]
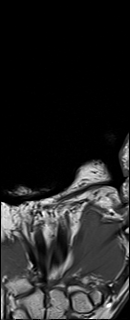
[im 9/26]
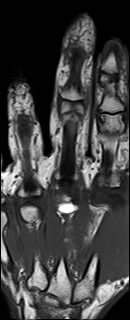
[im 17/26]
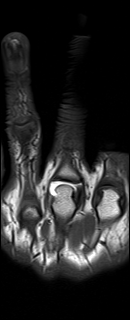
[im 26/26]
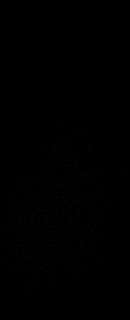

[Series 9: T2 fat-sat · coronal · right · 1.5mm · 0.53mm/px · 4 of 26 slices shown (2 of 2)]
[im 1/26]
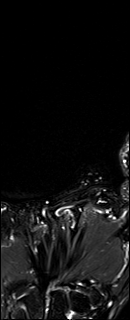
[im 9/26]
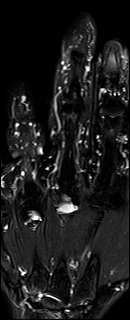
[im 17/26]
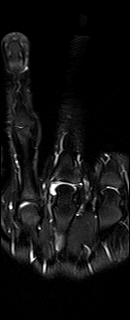
[im 26/26]
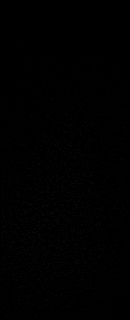

[Series 10: T1 fat-sat · sagittal · right · 1.5mm · 0.53mm/px · 3 of 20 slices shown (2 of 4)]
[im 1/20]
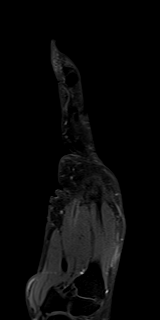
[im 10/20]
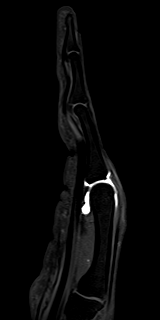
[im 20/20]
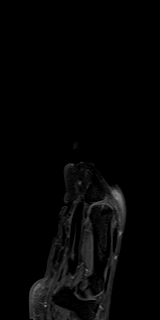

[Series 11: PD fat-sat · sagittal · right · 1.5mm · 0.53mm/px · 3 of 20 slices shown]
[im 1/20]
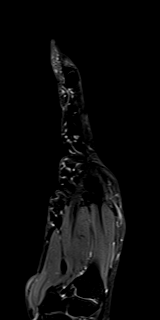
[im 10/20]
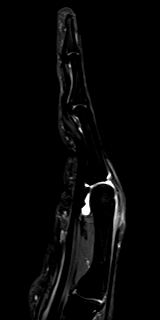
[im 20/20]
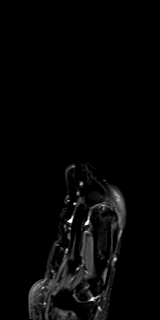

[Series 12: T1 fat-sat · axial · right · 1.0mm · 0.47mm/px · z∈[+10,+42]mm · 6 of 40 slices shown (3 of 4)]
[im 1/40]
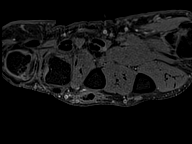
[im 8/40]
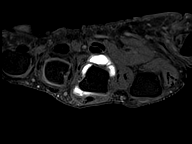
[im 16/40]
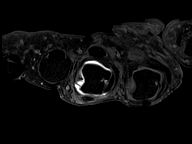
[im 24/40]
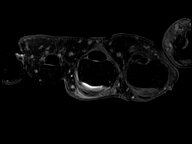
[im 32/40]
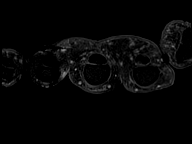
[im 40/40]
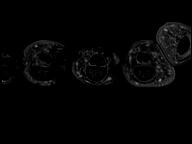

[Series 13: T1 fat-sat · axial · right · 3.0mm · 0.62mm/px · z∈[+7,+110]mm · 7 of 43 slices shown (4 of 4)]
[im 1/43]
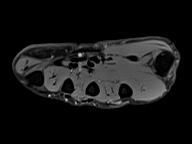
[im 8/43]
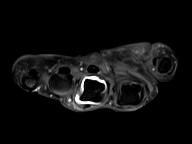
[im 15/43]
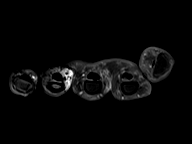
[im 22/43]
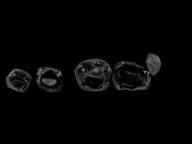
[im 29/43]
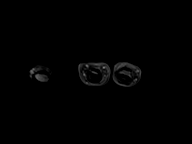
[im 36/43]
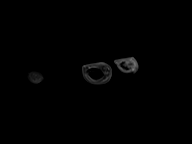
[im 43/43]
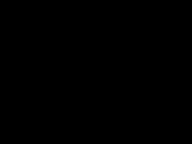

[40 of 40 positions shown; findings below may reference images not displayed]

FINDINGS: Bones/Joint/Cartilage

No acute fracture. No dislocation. No bone marrow edema. No
significant arthropathy. The third metacarpophalangeal joint is well
distended with injected contrast. No cartilage defect.

Ligaments

The ulnar collateral ligament of the third MCP joint is torn from
its distal attachment. Intact radial collateral ligament. Tiny
disruption within the radial sagittal band at the third MCP joint,
which may be injection-related or possibly posttraumatic (series 12,
image 24).

Muscles and Tendons

Intact flexor and extensor tendon without tear or tenosynovitis.
Contrast-filled dorsal and volar recesses without evidence of dorsal
or volar plate disruption at the third MCP joint. Unremarkable
muscle bulk and signal intensity.

Soft tissues

No hematoma.  No soft tissue mass.
IMPRESSION: 1. Tear of the ulnar collateral ligament of the third MCP joint from
its distal attachment.
2. Tiny disruption within the radial sagittal band at the third MCP
joint, which may be injection-related or possibly posttraumatic.
3. No acute osseous abnormality of the right long finger.
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
## 2021-09-21 MED ORDER — IOPAMIDOL (ISOVUE-M 200) INJECTION 41%
1.0000 mL | Freq: Once | INTRAMUSCULAR | Status: AC
  Administered 2021-09-21: 1 mL via INTRA_ARTICULAR

## 2021-09-29 ENCOUNTER — Ambulatory Visit (INDEPENDENT_AMBULATORY_CARE_PROVIDER_SITE_OTHER): Payer: BC Managed Care – PPO | Admitting: *Deleted

## 2021-09-29 DIAGNOSIS — J309 Allergic rhinitis, unspecified: Secondary | ICD-10-CM

## 2021-10-25 ENCOUNTER — Other Ambulatory Visit: Payer: Self-pay | Admitting: Orthopedic Surgery

## 2021-10-29 ENCOUNTER — Ambulatory Visit (INDEPENDENT_AMBULATORY_CARE_PROVIDER_SITE_OTHER): Payer: BC Managed Care – PPO

## 2021-10-29 DIAGNOSIS — J309 Allergic rhinitis, unspecified: Secondary | ICD-10-CM

## 2021-10-31 ENCOUNTER — Ambulatory Visit (INDEPENDENT_AMBULATORY_CARE_PROVIDER_SITE_OTHER): Payer: BC Managed Care – PPO

## 2021-10-31 ENCOUNTER — Ambulatory Visit (HOSPITAL_COMMUNITY)
Admission: EM | Admit: 2021-10-31 | Discharge: 2021-10-31 | Disposition: A | Discharge status: Home / Self Care | Payer: BC Managed Care – PPO | Attending: Student | Admitting: Student

## 2021-10-31 ENCOUNTER — Other Ambulatory Visit: Payer: Self-pay

## 2021-10-31 ENCOUNTER — Encounter (HOSPITAL_COMMUNITY): Payer: Self-pay | Admitting: *Deleted

## 2021-10-31 DIAGNOSIS — J111 Influenza due to unidentified influenza virus with other respiratory manifestations: Secondary | ICD-10-CM

## 2021-10-31 DIAGNOSIS — R0602 Shortness of breath: Secondary | ICD-10-CM

## 2021-10-31 DIAGNOSIS — J209 Acute bronchitis, unspecified: Secondary | ICD-10-CM | POA: Diagnosis not present

## 2021-10-31 DIAGNOSIS — N410 Acute prostatitis: Secondary | ICD-10-CM

## 2021-10-31 DIAGNOSIS — R059 Cough, unspecified: Secondary | ICD-10-CM | POA: Diagnosis not present

## 2021-10-31 LAB — POCT URINALYSIS DIPSTICK, ED / UC
Bilirubin Urine: NEGATIVE
Glucose, UA: NEGATIVE mg/dL
Leukocytes,Ua: NEGATIVE
Nitrite: NEGATIVE
Protein, ur: 30 mg/dL — AB
Specific Gravity, Urine: 1.02 (ref 1.005–1.030)
Urobilinogen, UA: 1 mg/dL (ref 0.0–1.0)
pH: 5.5 (ref 5.0–8.0)

## 2021-10-31 LAB — POC INFLUENZA A AND B ANTIGEN (URGENT CARE ONLY)
INFLUENZA A ANTIGEN, POC: NEGATIVE
INFLUENZA B ANTIGEN, POC: NEGATIVE

## 2021-10-31 MED ORDER — PROMETHAZINE-DM 6.25-15 MG/5ML PO SYRP: 5.0000 mL | ORAL_SOLUTION | Freq: Four times a day (QID) | ORAL | 0 refills | Status: DC | PRN

## 2021-10-31 MED ORDER — ACETAMINOPHEN 325 MG PO TABS
975.0000 mg | ORAL_TABLET | Freq: Once | ORAL | Status: AC
  Administered 2021-10-31: 975 mg via ORAL

## 2021-10-31 MED ORDER — CIPROFLOXACIN HCL 500 MG PO TABS: 500.0000 mg | ORAL_TABLET | Freq: Two times a day (BID) | ORAL | 0 refills | Status: DC

## 2021-10-31 MED ORDER — ACETAMINOPHEN 325 MG PO TABS
ORAL_TABLET | ORAL | Status: AC
  Filled 2021-10-31: qty 3

## 2021-10-31 MED ORDER — CEFTRIAXONE SODIUM 500 MG IJ SOLR
500.0000 mg | Freq: Once | INTRAMUSCULAR | Status: AC
  Administered 2021-10-31: 500 mg via INTRAMUSCULAR

## 2021-10-31 NOTE — Discharge Instructions (Addendum)
-  Ciprofloxacin twice daily x5 days. Your urologist might change this or continue it. Follow-up with them on 11/10.  -Prednisone,  40mg  x5 days. Try taking this earlier in the day as it can give you energy. Avoid NSAIDs like ibuprofen and alleve while taking this medication as they can increase your risk of stomach upset and even GI bleeding when in combination with a steroid. You can continue tylenol (acetaminophen) up to 1000mg  3x daily. -Promethazine DM cough syrup for congestion/cough. This could make you drowsy, so take at night before bed. -continue inhalers, allergy medication -Follow-up with your primary care or pulmonologist for recheck of the lungs in about 3 days, we are also happy to see you if they cannot.  Follow-up sooner if symptoms getting worse.

## 2021-10-31 NOTE — ED Triage Notes (Signed)
Pt reports fever,cough and chills since Friday.

## 2021-10-31 NOTE — ED Provider Notes (Signed)
Rickey Cruz    CSN: [id number] Arrival date & time: 10/31/21  1538      History   Chief Complaint Chief Complaint  Patient presents with   Fever   Chills   Generalized Body Aches    HPI Rickey Cruz is a 60 y.o. male presenting with 3 days of viral symptoms.  Medical history prostatitis, prediabetes, tuberculosis, lung nodules, asthma, allergies, GERD, kidney stones.  We last saw him on 9/25 for acute prostatitis, he was treated with 2 weeks of Bactrim, IM Rocephin, and advised to follow-up with his urologist.  He unfortunately has not yet followed up with his urologist though he did complete the antibiotics as directed.  Has the appt on 11/10. States that the symptoms improved, though he does endorse 2 days of midline back pain.  He initially attributed this to exercise, but then he felt sensation of incomplete void x1 day.  He also notes shortness of breath, cough, fevers for 3 days.  Cough is nonproductive, does endorse shortness of breath.  He is using the Ellipta and albuterol inhalers as directed with only minimal improvement.  Temperature ranging 103 at home, reduced by antipyretics, has not taken this today.  Denies chest pain, dizziness, weakness.  Denies known sick contacts.   HPI  Past Medical History:  Diagnosis Date   Allergy    Flonase, Patanol, Zyrtec   Arthritis    psoriasis   Asthma    seasonal, with allergies   Borderline diabetes    Cluster headache    Colon polyp 10/27/2011   Depression    denies 11/11/16; denies 11/21/18 "been years"   Eczema    GERD (gastroesophageal reflux disease)    History of kidney stones    x1   Hyperlipidemia    Hypertension    Low back pain    "in kidney area-was told that it was related to gallstones"   Prediabetes    Psoriasis    [name]   Severe sleep apnea    h/o; had procedure and weight control, does not have to wear CPAP   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, 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     Patient Active Problem List   Diagnosis Date Noted   Seasonal and perennial allergic rhinoconjunctivitis 04/05/2021   Moderate persistent asthma without complication 01/74/9449   Prediabetes 04/02/2019   Hyperlipidemia 12/23/2014   Psoriasis 06/27/2014   Multiple lung nodules 12/09/2011   PULMONARY HYPERTENSION 03/04/2010   Essential hypertension 02/10/2010   Asthma in adult 02/10/2010   OBSTRUCTIVE SLEEP APNEA 02/04/2010    Past Surgical History:  Procedure Laterality Date   APPENDECTOMY     CHOLECYSTECTOMY N/A 12/16/2014   Procedure: LAPAROSCOPIC CHOLECYSTECTOMY WITH INTRAOPERATIVE CHOLANGIOGRAM;  Surgeon: [name], MD;  Location: [facility];  Service: General;  Laterality: N/A;   COLONOSCOPY  10/27/2011   single polyp. Repeat 5 years.   POLYPECTOMY     PROSTATE BIOPSY  10/2014   will have another in 3 months   SEPTOPLASTY  2014   TONSILLECTOMY  2014   UVULOPALATOPHARYNGOPLASTY  2014       Home Medications    Prior to Admission medications   Medication Sig Start Date End Date Taking? Authorizing Provider  ciprofloxacin (CIPRO) 500 MG tablet Take 1 tablet (500 mg total) by mouth every 12 (twelve) hours. 10/31/21  Yes Hazel Sams, PA-C  promethazine-dextromethorphan (PROMETHAZINE-DM) 6.25-15 MG/5ML syrup Take 5 mLs by mouth 4 (four) times daily as needed for cough.  10/31/21  Yes Hazel Sams, PA-C  albuterol (VENTOLIN HFA) 108 (90 Base) MCG/ACT inhaler Inhale 2 puffs into the lungs every 4 (four) hours as needed for wheezing or shortness of breath (coughing fits). 12/01/20   Garnet Sierras, DO  amLODipine (NORVASC) 5 MG tablet Take 1 tablet (5 mg total) by mouth daily. 12/15/20   Horald Pollen, MD  aspirin EC 325 MG tablet Take 325 mg by mouth as needed.    [provider]  clobetasol (OLUX) 0.05 % topical foam APPLY EXTERNALLY TO THE AFFECTED AREA TWICE DAILY 03/31/20   Jacelyn Pi, Lilia Argue, MD  clobetasol ointment (TEMOVATE) 0.05 % APPLY EXTERNALLY  TO THE AFFECTED AREA TWICE DAILY 04/17/20   Jacelyn Pi, Lilia Argue, MD  EPINEPHrine (AUVI-Q) 0.3 mg/0.3 mL IJ SOAJ injection Inject 0.3 mg into the muscle as needed for anaphylaxis. Patient not taking: Reported on 09/13/2021 12/01/20   Garnet Sierras, DO  fluocinolone (VANOS) 0.01 % cream Apply topically daily. 12/06/19   Jacelyn Pi, Lilia Argue, MD  fluocinonide cream (LIDEX) 0.05 % APPLY SPARINGLY EXTERNALLY TO THE AFFECTED AREA TWICE DAILY 02/02/20   Jacelyn Pi, Lilia Argue, MD  Fluticasone Furoate (ARNUITY ELLIPTA) 100 MCG/ACT AEPB Inhale 1 puff into the lungs daily. Rinse mouth after each use. 04/05/21   Garnet Sierras, DO  gabapentin (NEURONTIN) 100 MG capsule Take 100 mg by mouth 3 (three) times daily.    [provider]  hydrocortisone (ANUSOL-HC) 2.5 % rectal cream Place 1 application rectally 2 (two) times daily. 12/15/20   Horald Pollen, MD  hyoscyamine (LEVSIN SL) 0.125 MG SL tablet Place 1 tablet (0.125 mg total) under the tongue every 8 (eight) hours as needed. 08/20/19   Willia Craze, NP  ibuprofen (ADVIL) 600 MG tablet Take 1 tablet (600 mg total) by mouth every 6 (six) hours as needed. 06/03/20   Melynda Ripple, MD  ketoconazole (NIZORAL) 2 % shampoo APPLY EXTERNALLY 2 TIMES A WEEK. 11/25/17   [name], MD  lisinopril (ZESTRIL) 40 MG tablet Take 1 tablet (40 mg total) by mouth daily. 07/07/21   Horald Pollen, MD  mometasone (NASONEX) 50 MCG/ACT nasal spray Place 1-2 sprays into the nose daily as needed (nasal symptoms). 12/01/20   Garnet Sierras, DO  montelukast (SINGULAIR) 10 MG tablet TAKE 1 TABLET(10 MG) BY MOUTH AT BEDTIME 08/28/20   Lauraine Rinne, NP  Olopatadine HCl 0.2 % SOLN Apply 1 drop to eye daily as needed (itchy/watery eyes). Patient not taking: Reported on 09/13/2021 12/01/20   Garnet Sierras, DO  phenazopyridine (PYRIDIUM) 95 MG tablet Take 1 tablet (95 mg total) by mouth 3 (three) times daily as needed (dysuria). 09/20/21   Larene Pickett, PA-C  rosuvastatin  (CRESTOR) 10 MG tablet Take 1 tablet (10 mg total) by mouth daily. 09/09/21   Horald Pollen, MD  sildenafil (VIAGRA) 100 MG tablet Take 0.5-1 tablets (50-100 mg total) by mouth daily as needed for erectile dysfunction. Patient not taking: Reported on 09/13/2021 07/09/21   Horald Pollen, MD    Family History Family History  Problem Relation Age of Onset   Stroke Mother    Asthma Sister    Colon cancer Neg Hx    Esophageal cancer Neg Hx    Stomach cancer Neg Hx    Prostate cancer Neg Hx    Diabetes Neg Hx    CAD Neg Hx    Migraines Neg Hx    Headache  Neg Hx     Social History Social History   Tobacco Use   Smoking status: Never   Smokeless tobacco: Never  Vaping Use   Vaping Use: Never used  Substance Use Topics   Alcohol use: Never    Alcohol/week: 0.0 standard drinks   Drug use: Never     Allergies   Patient has no known allergies.   Review of Systems Review of Systems  Constitutional:  Positive for chills and fever. Negative for appetite change.  HENT:  Positive for congestion. Negative for ear pain, rhinorrhea, sinus pressure, sinus pain and sore throat.   Eyes:  Negative for redness and visual disturbance.  Respiratory:  Positive for cough and shortness of breath. Negative for chest tightness and wheezing.   Cardiovascular:  Negative for chest pain and palpitations.  Gastrointestinal:  Negative for abdominal pain, constipation, diarrhea, nausea and vomiting.  Genitourinary:  Negative for dysuria, frequency and urgency.  Musculoskeletal:  Positive for back pain. Negative for myalgias.  Neurological:  Negative for dizziness, weakness and headaches.  Psychiatric/Behavioral:  Negative for confusion.   All other systems reviewed and are negative.   Physical Exam Triage Vital Signs ED Triage Vitals [10/31/21 1638]  Enc Vitals Group     BP 132/86     Pulse Rate (!) 114     Resp 20     Temp (!) 103 F (39.4 C)     Temp src      SpO2 95 %      Weight      Height      Head Circumference      Peak Flow      Pain Score      Pain Loc      Pain Edu?      Excl. in [place]?    No data found.  Updated Vital Signs BP 132/86   Pulse (!) 114   Temp (!) 103 F (39.4 C)   Resp 20   SpO2 95%   Visual Acuity Right Eye Distance:   Left Eye Distance:   Bilateral Distance:    Right Eye Near:   Left Eye Near:    Bilateral Near:     Physical Exam Vitals reviewed.  Constitutional:      General: He is not in acute distress.    Appearance: Normal appearance. He is ill-appearing.  HENT:     Head: Normocephalic and atraumatic.     Right Ear: Tympanic membrane, ear canal and external ear normal. No tenderness. No middle ear effusion. There is no impacted cerumen. Tympanic membrane is not perforated, erythematous, retracted or bulging.     Left Ear: Tympanic membrane, ear canal and external ear normal. No tenderness.  No middle ear effusion. There is no impacted cerumen. Tympanic membrane is not perforated, erythematous, retracted or bulging.     Nose: Nose normal. No congestion.     Mouth/Throat:     Mouth: Mucous membranes are moist.     Pharynx: Uvula midline. No oropharyngeal exudate or posterior oropharyngeal erythema.     Comments: Moist mucous membranes Eyes:     Extraocular Movements: Extraocular movements intact.     Pupils: Pupils are equal, round, and reactive to light.  Cardiovascular:     Rate and Rhythm: Regular rhythm. Tachycardia present.     Heart sounds: Normal heart sounds.  Pulmonary:     Effort: Pulmonary effort is normal.     Breath sounds: Rhonchi present. No decreased breath sounds, wheezing or  rales.     Comments: Rhonchi throughout. Shallow respirations. FREQUENT coughing. Abdominal:     General: Bowel sounds are normal. There is no distension.     Palpations: Abdomen is soft. There is no mass.     Tenderness: There is no abdominal tenderness. There is no right CVA tenderness, left CVA tenderness, guarding  or rebound.     Comments: No reproducible abd or flank pain  Lymphadenopathy:     Cervical: No cervical adenopathy.     Right cervical: No superficial cervical adenopathy.    Left cervical: No superficial cervical adenopathy.  Skin:    General: Skin is warm.     Capillary Refill: Capillary refill takes less than 2 seconds.     Comments: Good skin turgor  Neurological:     General: No focal deficit present.     Mental Status: He is alert and oriented to person, place, and time.  Psychiatric:        Mood and Affect: Mood normal.        Behavior: Behavior normal.        Thought Content: Thought content normal.        Judgment: Judgment normal.     UC Treatments / Results  Labs (all labs ordered are listed, but only abnormal results are displayed) Labs Reviewed  POCT URINALYSIS DIPSTICK, ED / UC - Abnormal; Notable for the following components:      Result Value   Ketones, ur TRACE (*)    Hgb urine dipstick MODERATE (*)    Protein, ur 30 (*)    All other components within normal limits  URINE CULTURE  POC INFLUENZA A AND B ANTIGEN (URGENT CARE ONLY)    EKG   Radiology DG Chest 2 View  Result Date: 10/31/2021 CLINICAL DATA:  Two days of cough and shortness of breath. EXAM: CHEST - 2 VIEW COMPARISON:  August 23, 2019 FINDINGS: The heart size and mediastinal contours are within normal limits. Aortic atherosclerosis. Low lung volume with bibasilar atelectasis. Mild perihilar predominant bronchial wall thickening. No pleural effusion. No pneumothorax. The visualized skeletal structures are unremarkable. IMPRESSION: 1. Low lung volume with bibasilar atelectasis. 2. Mild perihilar predominant bronchial wall thickening could reflect bronchitis/bronchiolitis. Correlate with symptoms of edema or atypical infection. Electronically Signed   By: Dahlia Bailiff M.D.   On: 10/31/2021 17:22    Procedures Procedures (including critical care time)  Medications Ordered in UC Medications   cefTRIAXone (ROCEPHIN) injection 500 mg (has no administration in time range)  acetaminophen (TYLENOL) tablet 975 mg (975 mg Oral Given 10/31/21 1644)    Initial Impression / Assessment and Plan / UC Course  I have reviewed the triage vital signs and the nursing notes.  Pertinent labs & imaging results that were available during my care of the patient were reviewed by me and considered in my medical decision making (see chart for details).     This patient is a very pleasant 60 y.o. year old male presenting with prostatitis and bronchitis.  Febrile at 103, tachycardic at 114.  Rhonchi throughout.  Acetaminophen administered during visit.  This patient was treated for prostatitis about 1 month ago, he was unable to follow-up with his urologist though he did complete antibiotics as directed.  UA wnl, culture sent.I do have concern of recurrence of this given new onset of midline back pain. He does have an appt with urology on 11/10.  Rapid influenza negative, I suspect this was a false negative.  CXR -  1. Low lung volume with bibasilar atelectasis. 2. Mild perihilar predominant bronchial wall thickening could reflect bronchitis/bronchiolitis. Correlate with symptoms of edema or atypical infection  Continue albuterol, ellipta inhalers. Will manage with ciprofloxacin x5 days pending urology f/u. Prednisone, he has this at home already. Also sent promethazine DM. CLOSE follow-up with pulm or PCP in 3 days.  ED return precautions discussed. Patient verbalizes understanding and agreement.   Coding Level 4 for acute illness with systemic symptoms, and prescription drug management   Final Clinical Impressions(s) / UC Diagnoses   Final diagnoses:  Acute bacterial prostatitis  Acute bronchitis, unspecified organism  Influenza with respiratory manifestation     Discharge Instructions      -Ciprofloxacin twice daily x5 days. Your urologist might change this or continue it. Follow-up with  them on 11/10.  -Prednisone,  40mg  x5 days. Try taking this earlier in the day as it can give you energy. Avoid NSAIDs like ibuprofen and alleve while taking this medication as they can increase your risk of stomach upset and even GI bleeding when in combination with a steroid. You can continue tylenol (acetaminophen) up to 1000mg  3x daily. -Promethazine DM cough syrup for congestion/cough. This could make you drowsy, so take at night before bed. -continue inhalers, allergy medication -Follow-up with your primary care or pulmonologist for recheck of the lungs in about 3 days, we are also happy to see you if they cannot.  Follow-up sooner if symptoms getting worse.      ED Prescriptions     Medication Sig Dispense Auth. Provider   ciprofloxacin (CIPRO) 500 MG tablet Take 1 tablet (500 mg total) by mouth every 12 (twelve) hours. 10 tablet Hazel Sams, PA-C   promethazine-dextromethorphan (PROMETHAZINE-DM) 6.25-15 MG/5ML syrup Take 5 mLs by mouth 4 (four) times daily as needed for cough. 118 mL Hazel Sams, PA-C      PDMP not reviewed this encounter.   Hazel Sams, PA-C 10/31/21 1745

## 2021-11-01 LAB — URINE CULTURE: Culture: NO GROWTH

## 2021-11-02 ENCOUNTER — Other Ambulatory Visit: Payer: Self-pay

## 2021-11-02 ENCOUNTER — Ambulatory Visit: Payer: BC Managed Care – PPO | Admitting: Allergy and Immunology

## 2021-11-02 ENCOUNTER — Encounter: Payer: Self-pay | Admitting: Allergy and Immunology

## 2021-11-02 VITALS — BP 130/72 | HR 74 | Temp 97.9°F | Resp 20 | Ht 66.0 in | Wt 184.4 lb

## 2021-11-02 DIAGNOSIS — H1013 Acute atopic conjunctivitis, bilateral: Secondary | ICD-10-CM

## 2021-11-02 DIAGNOSIS — J302 Other seasonal allergic rhinitis: Secondary | ICD-10-CM | POA: Diagnosis not present

## 2021-11-02 DIAGNOSIS — J3089 Other allergic rhinitis: Secondary | ICD-10-CM

## 2021-11-02 DIAGNOSIS — J4540 Moderate persistent asthma, uncomplicated: Secondary | ICD-10-CM | POA: Diagnosis not present

## 2021-11-02 DIAGNOSIS — J4530 Mild persistent asthma, uncomplicated: Secondary | ICD-10-CM

## 2021-11-02 DIAGNOSIS — H1010 Acute atopic conjunctivitis, unspecified eye: Secondary | ICD-10-CM

## 2021-11-02 DIAGNOSIS — U071 COVID-19: Secondary | ICD-10-CM

## 2021-11-02 DIAGNOSIS — J301 Allergic rhinitis due to pollen: Secondary | ICD-10-CM

## 2021-11-02 MED ORDER — NIRMATRELVIR/RITONAVIR (PAXLOVID)TABLET: 3.0000 | ORAL_TABLET | Freq: Two times a day (BID) | ORAL | 0 refills | Status: AC

## 2021-11-02 MED ORDER — MOMETASONE FUROATE 50 MCG/ACT NA SUSP
1.0000 | Freq: Every day | NASAL | 5 refills | Status: DC | PRN
Start: 2021-11-02 — End: 2022-04-27

## 2021-11-02 MED ORDER — HYDROCOD POLST-CPM POLST ER 10-8 MG/5ML PO SUER: 5.0000 mL | Freq: Two times a day (BID) | ORAL | 0 refills | Status: DC | PRN

## 2021-11-02 MED ORDER — OLOPATADINE HCL 0.2 % OP SOLN
1.0000 [drp] | Freq: Every day | OPHTHALMIC | 5 refills | Status: DC | PRN
Start: 2021-11-02 — End: 2022-03-28

## 2021-11-02 MED ORDER — LORATADINE 10 MG PO TABS: 10.0000 mg | ORAL_TABLET | Freq: Two times a day (BID) | ORAL | 5 refills | Status: DC

## 2021-11-02 MED ORDER — ALBUTEROL SULFATE HFA 108 (90 BASE) MCG/ACT IN AERS: 2.0000 | INHALATION_SPRAY | RESPIRATORY_TRACT | 2 refills | Status: DC | PRN

## 2021-11-02 NOTE — Patient Instructions (Addendum)
  1.  Continue Flonase 2 sprays each nostril once a day  2.  Continue Singulair 10 mg 1 tablet once a day  3.  Continue albuterol HFA 2 inhalations every 4-6 hours as needed  4.  Continue Pataday 1 drop each eye 1 time per day if needed  5.  Continue immunotherapy  6.  For this recent episode use the following:  A. Nasal saline several times per day B. OTC Ibuprofen 600 mg 2-3 times per day C. Loratadine 10 mg - 1 tablet 2 times per day D. Continue prednisone from Urgent Care E. Stop Ciprofloxacin F. Tussionex - 2.5 - 5.0 mls every 12 hours if needed. NARCOTIC. 60 ml G. Paxlovid - 2 times per day for 5 days  7. Further treatment???  8. Return to clinic in 6 months or earlier if problem

## 2021-11-02 NOTE — Progress Notes (Signed)
[facility]   Follow-up Note  Referring Provider: Horald Pollen, * Primary Provider: Horald Pollen, MD Date of Office Visit: 11/02/2021  Subjective:   Rickey Cruz (DOB: 05/15/1961) is a 60 y.o. male who returns to the [facility] on 11/02/2021 in re-evaluation of the following:  HPI: Rickey Cruz presents to this clinic in evaluation of an acute respiratory tract infection.  He is followed in this clinic for allergic rhinoconjunctivitis and asthma and his last visit to this clinic was 05 April 2021 with Dr. Maudie Mercury.  Apparently 5 days ago he developed acute onset sneezing and sore throat and ear itching and chills and sweating and he went to the urgent care center 48 hours ago and had a fever of 103.  He did a home COVID test which was negative and he had a flu swab at the urgent care center which was negative.  He was given ciprofloxacin and prednisone and a cough syrup.  Currently he has coughing and nasal congestion and runny nose.  The material coming from his nose is clear.  The material that he is sometimes coughing up from his throat is green.  This cough is disturbing his sleep.  He still has an irritated throat and has had low-grade headache in his frontal region.  He continues to use Flonase and Singulair on a consistent basis.  He rarely uses antihistamines or Pataday or albuterol.  He continues on immunotherapy currently at every week without any adverse effect.  He has received 4 COVID vaccines and a flu vaccine.  Allergies as of 11/02/2021   No Known Allergies      Medication List    albuterol 108 (90 Base) MCG/ACT inhaler Commonly known as: VENTOLIN HFA Inhale 2 puffs into the lungs every 4 (four) hours as needed for wheezing or shortness of breath (coughing fits).   amLODipine 5 MG tablet Commonly known as: NORVASC Take 1 tablet (5 mg total) by mouth daily.   Arnuity Ellipta 100 MCG/ACT  Aepb Generic drug: Fluticasone Furoate Inhale 1 puff into the lungs daily. Rinse mouth after each use.   aspirin EC 325 MG tablet Take 325 mg by mouth as needed.   ciprofloxacin 500 MG tablet Commonly known as: CIPRO Take 1 tablet (500 mg total) by mouth every 12 (twelve) hours.   clobetasol 0.05 % topical foam Commonly known as: OLUX APPLY EXTERNALLY TO THE AFFECTED AREA TWICE DAILY   clobetasol ointment 0.05 % Commonly known as: TEMOVATE APPLY EXTERNALLY TO THE AFFECTED AREA TWICE DAILY   EPINEPHrine 0.3 mg/0.3 mL Soaj injection Commonly known as: Auvi-Q Inject 0.3 mg into the muscle as needed for anaphylaxis.   fluocinolone 0.01 % cream Commonly known as: VANOS Apply topically daily.   fluocinonide cream 0.05 % Commonly known as: LIDEX APPLY SPARINGLY EXTERNALLY TO THE AFFECTED AREA TWICE DAILY   gabapentin 100 MG capsule Commonly known as: NEURONTIN Take 100 mg by mouth 3 (three) times daily.   hydrocortisone 2.5 % rectal cream Commonly known as: Anusol-HC Place 1 application rectally 2 (two) times daily.   hyoscyamine 0.125 MG SL tablet Commonly known as: LEVSIN SL Place 1 tablet (0.125 mg total) under the tongue every 8 (eight) hours as needed.   ibuprofen 600 MG tablet Commonly known as: ADVIL Take 1 tablet (600 mg total) by mouth every 6 (six) hours as needed.   ketoconazole 2 % shampoo Commonly known as: NIZORAL APPLY EXTERNALLY 2 TIMES  A WEEK.   lisinopril 40 MG tablet Commonly known as: ZESTRIL Take 1 tablet (40 mg total) by mouth daily.   mometasone 50 MCG/ACT nasal spray Commonly known as: Nasonex Place 1-2 sprays into the nose daily as needed (nasal symptoms).   montelukast 10 MG tablet Commonly known as: SINGULAIR TAKE 1 TABLET(10 MG) BY MOUTH AT BEDTIME   Olopatadine HCl 0.2 % Soln Apply 1 drop to eye daily as needed (itchy/watery eyes).   phenazopyridine 95 MG tablet Commonly known as: PYRIDIUM Take 1 tablet (95 mg total) by mouth 3  (three) times daily as needed (dysuria).   promethazine-dextromethorphan 6.25-15 MG/5ML syrup Commonly known as: PROMETHAZINE-DM Take 5 mLs by mouth 4 (four) times daily as needed for cough.   rosuvastatin 10 MG tablet Commonly known as: CRESTOR Take 1 tablet (10 mg total) by mouth daily.   sildenafil 100 MG tablet Commonly known as: Viagra Take 0.5-1 tablets (50-100 mg total) by mouth daily as needed for erectile dysfunction.        Past Medical History:  Diagnosis Date   Allergy    Flonase, Patanol, Zyrtec   Arthritis    psoriasis   Asthma    seasonal, with allergies   Borderline diabetes    Cluster headache    Colon polyp 10/27/2011   Depression    denies 11/11/16; denies 11/21/18 "been years"   Eczema    GERD (gastroesophageal reflux disease)    History of kidney stones    x1   Hyperlipidemia    Hypertension    Low back pain    "in kidney area-was told that it was related to gallstones"   Prediabetes    Psoriasis    [name]   Severe sleep apnea    h/o; had procedure and weight control, does not have to wear CPAP   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 Repeat 5 years.   POLYPECTOMY     PROSTATE BIOPSY  10/2014   will have another in 3 months   SEPTOPLASTY  2014   TONSILLECTOMY  2014   UVULOPALATOPHARYNGOPLASTY  2014    Review of systems negative except as noted in HPI / PMHx or noted below:  Review of Systems  Constitutional: Negative.   HENT: Negative.    Eyes: Negative.   Respiratory: Negative.    Cardiovascular: Negative.   Gastrointestinal: Negative.   Genitourinary: Negative.   Musculoskeletal:  Negative.   Skin: Negative.   Neurological: Negative.   Endo/Heme/Allergies: Negative.   Psychiatric/Behavioral: Negative.      Objective:   Vitals:   11/02/21 1154 11/02/21 1210  BP: 138/72 130/72  Pulse: 74   Resp: 20   Temp: 97.9 F (36.6 C)   SpO2: 95%    Height: 5\' 6"  (167.6 cm)  Weight: 184 lb 6.4 oz (83.6 kg)   Physical Exam Constitutional:      Appearance: He is not diaphoretic.     Comments: Nasal voice, throat clearing, cough  HENT:     Head: Normocephalic.     Right Ear: Tympanic membrane, ear canal and external ear normal.     Left Ear: Tympanic membrane, ear canal and external ear  normal.     Nose: Mucosal edema (Erythematous) and rhinorrhea (Clear) present.     Mouth/Throat:     Pharynx: Uvula midline. Posterior oropharyngeal erythema present. No oropharyngeal exudate.  Eyes:     Conjunctiva/sclera: Conjunctivae normal.  Neck:     Thyroid: No thyromegaly.     Trachea: Trachea normal. No tracheal tenderness or tracheal deviation.  Cardiovascular:     Rate and Rhythm: Normal rate and regular rhythm.     Heart sounds: Normal heart sounds, S1 normal and S2 normal. No murmur heard. Pulmonary:     Effort: No respiratory distress.     Breath sounds: Normal breath sounds. No stridor. No wheezing or rales.  Lymphadenopathy:     Head:     Right side of head: No tonsillar adenopathy.     Left side of head: No tonsillar adenopathy.     Cervical: No cervical adenopathy.  Skin:    Findings: No erythema or rash.     Nails: There is no clubbing.  Neurological:     Mental Status: He is alert.    Diagnostics: BIANAX NOW COVID SWAB positive  Assessment and Plan:   1. COVID-19 virus infection   2. Not well controlled mild persistent asthma   3. Seasonal and perennial allergic rhinoconjunctivitis   4. Moderate persistent asthma in adult without complication   5. Seasonal allergic rhinitis due to pollen     1.  Continue Flonase 2 sprays each nostril once a  day  2.  Continue Singulair 10 mg 1 tablet once a day  3.  Continue albuterol HFA 2 inhalations every 4-6 hours as needed  4.  Continue Pataday 1 drop each eye 1 time per day if needed  5.  Continue immunotherapy  6.  For this recent episode use the following:  A. Nasal saline several times per day B. OTC Ibuprofen 600 mg 2-3 times per day C. Loratadine 10 mg - 1 tablet 2 times per day D. Continue prednisone from Urgent Care E. Stop Ciprofloxacin F. Tussionex - 2.5 - 5.0 mls every 12 hours if needed. NARCOTIC. 60 ml G. Paxlovid - 2 times per day for 5 days  7. Further treatment???  8. Return to clinic in 6 months or earlier if problem  Chey is infected with COVID and this infection is producing significant inflammation of his airway and he will utilize the therapy noted above to address this issue.We will start him on antiviral agents. He will continue to address his atopic disease with the therapy noted above including immunotherapy.  Assuming he does well with this plan we will see him back in this clinic in 6 months or earlier if there is a problem.  Allena Katz, MD Allergy / Immunology [place]

## 2021-11-03 ENCOUNTER — Encounter: Payer: Self-pay | Admitting: Allergy and Immunology

## 2021-11-03 NOTE — Progress Notes (Signed)
Per Epic note, Mr Saldivar tested +Covid 11-02-21. He will need to wait at least 10d before he can be rescheduled and fever free. Hassan Rowan at Dr Levell July office notified of need to reschedule.

## 2021-11-09 ENCOUNTER — Encounter (HOSPITAL_BASED_OUTPATIENT_CLINIC_OR_DEPARTMENT_OTHER): Admission: RE | Payer: Self-pay | Source: Home / Self Care

## 2021-11-09 ENCOUNTER — Ambulatory Visit (HOSPITAL_BASED_OUTPATIENT_CLINIC_OR_DEPARTMENT_OTHER)
Admission: RE | Admit: 2021-11-09 | Payer: BC Managed Care – PPO | Source: Home / Self Care | Admitting: Orthopedic Surgery

## 2021-11-09 SURGERY — REPAIR, LIGAMENT
Anesthesia: Regional | Laterality: Right

## 2021-11-11 ENCOUNTER — Ambulatory Visit: Payer: BC Managed Care – PPO | Admitting: Family Medicine

## 2021-11-17 ENCOUNTER — Other Ambulatory Visit: Payer: Self-pay | Admitting: Urology

## 2021-11-17 DIAGNOSIS — R972 Elevated prostate specific antigen [PSA]: Secondary | ICD-10-CM

## 2021-11-22 ENCOUNTER — Other Ambulatory Visit: Payer: Self-pay | Admitting: Emergency Medicine

## 2021-12-12 ENCOUNTER — Ambulatory Visit
Admission: RE | Admit: 2021-12-12 | Discharge: 2021-12-12 | Disposition: A | Discharge status: Home / Self Care | Payer: BC Managed Care – PPO | Source: Ambulatory Visit | Attending: Urology | Admitting: Urology

## 2021-12-12 DIAGNOSIS — R972 Elevated prostate specific antigen [PSA]: Secondary | ICD-10-CM

## 2021-12-12 IMAGING — MR MR PROSTATE WO/W CM
12 series · 48 of 48 positions shown · IV contrast (Multihance 15ml)
Comparison: [DATE]

CLINICAL DATA: Elevated PSA.

EXAM:
MR PROSTATE WITHOUT AND WITH CONTRAST
TECHNIQUE: Multiplanar multisequence MRI images were obtained of the pelvis
centered about the prostate. Pre and post contrast images were
obtained.
CONTRAST:  15mL MULTIHANCE GADOBENATE DIMEGLUMINE 529 MG/ML IV SOLN

[Series 3: T2 · coronal · 3.0mm · 0.56mm/px · 1 of 25 slices shown (1 of 3)]
[im 1/25]
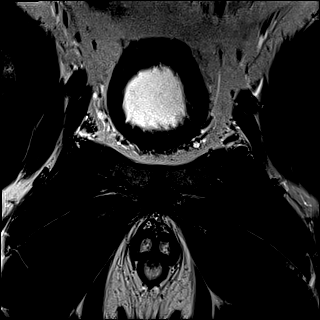

[Series 4: T1 · axial · 5.0mm · 1.25mm/px · 1 of 80 slices shown]
[im 1/80]
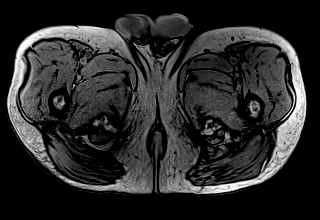

[Series 5: DWI · axial · 3.0mm · 1.75mm/px · 1 of 84 slices shown (1 of 3)]
[im 1/84]
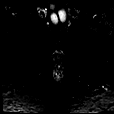

[Series 6: DWI · axial · 3.0mm · 1.75mm/px · 1 of 28 slices shown (2 of 3)]
[im 1/28]
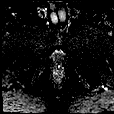

[Series 7: DWI · axial · 3.0mm · 1.75mm/px · 1 of 28 slices shown (3 of 3)]
[im 1/28]
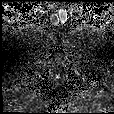

[Series 8: T2 · axial · 3.0mm · 0.56mm/px · 1 of 28 slices shown (2 of 3)]
[im 1/28]
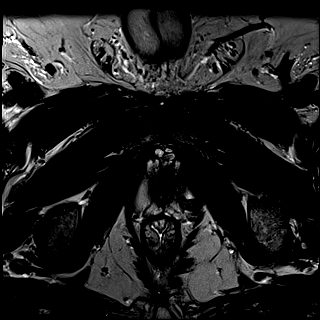

[Series 9: T2 · axial · 1.0mm · 1.04mm/px · z∈[+49,+136]mm · 2 of 88 slices shown (3 of 3)]
[im 1/88]
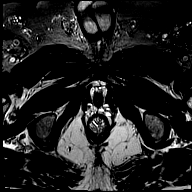
[im 88/88]
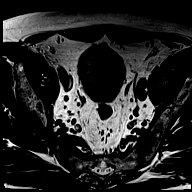

[Series 10: pre t1_twist_tra_dyn · axial · non-contrast · 3.5mm · 0.83mm/px · 1 of 26 slices shown]
[im 1/26]
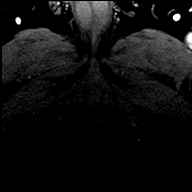

[Series 11: post t1_twist_tra_dyn-copy center · axial · non-contrast · 3.5mm · 0.83mm/px · z∈[+49,+129]mm · 18 of 720 slices shown]
[im 1/720]
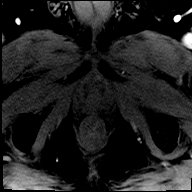
[im 43/720]
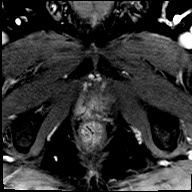
[im 85/720]
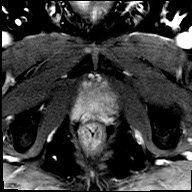
[im 127/720]
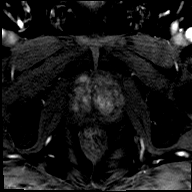
[im 170/720]
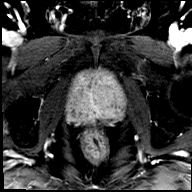
[im 212/720]
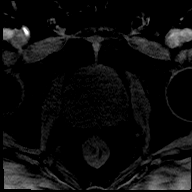
[im 254/720]
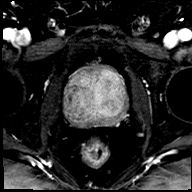
[im 297/720]
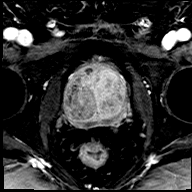
[im 339/720]
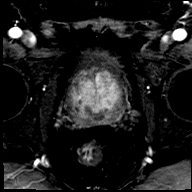
[im 381/720]
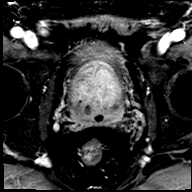
[im 423/720]
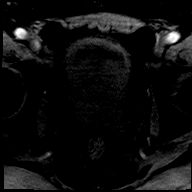
[im 466/720]
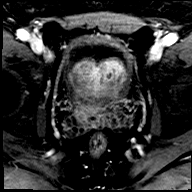
[im 508/720]
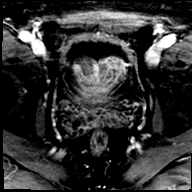
[im 550/720]
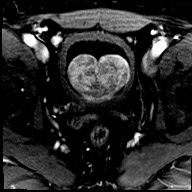
[im 593/720]
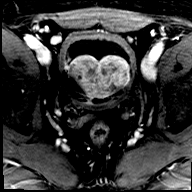
[im 635/720]
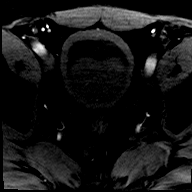
[im 677/720]
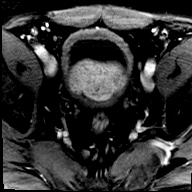
[im 720/720]
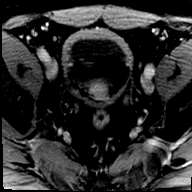

[Series 12: post t1_twist_tra_dyn-copy cent_sub · axial · 3.5mm · 0.83mm/px · z∈[+49,+129]mm · 17 of 691 slices shown]
[im 1/691]
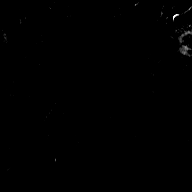
[im 44/691]
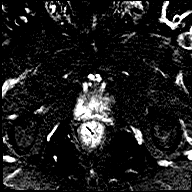
[im 87/691]
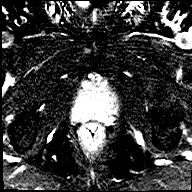
[im 130/691]
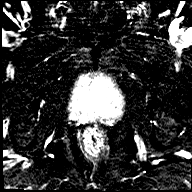
[im 173/691]
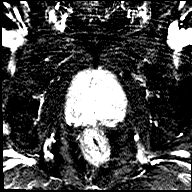
[im 216/691]
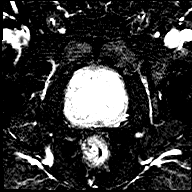
[im 259/691]
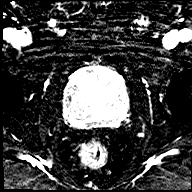
[im 302/691]
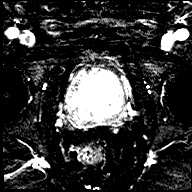
[im 346/691]
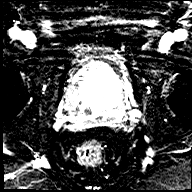
[im 389/691]
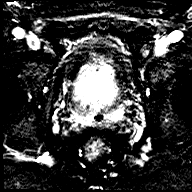
[im 432/691]
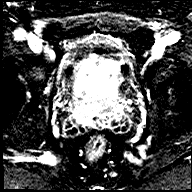
[im 475/691]
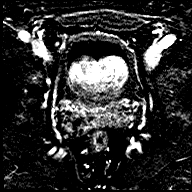
[im 518/691]
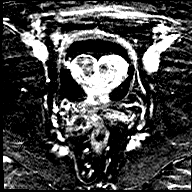
[im 561/691]
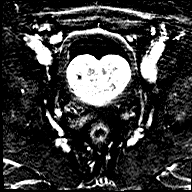
[im 604/691]
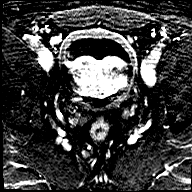
[im 647/691]
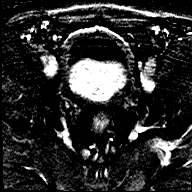
[im 691/691]
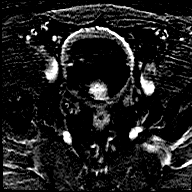

[Series 13: t1_vibe_dixon_tra_f · axial · 2.5mm · 0.91mm/px · z∈[-8,+190]mm · 2 of 80 slices shown]
[im 1/80]
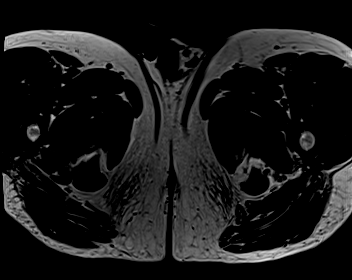
[im 80/80]
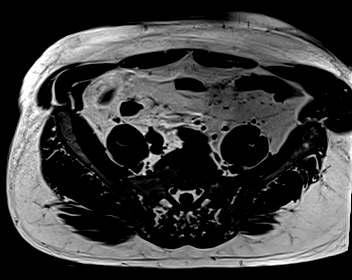

[Series 14: t1_vibe_dixon_tra_w · axial · 2.5mm · 0.91mm/px · z∈[-8,+190]mm · 2 of 80 slices shown]
[im 1/80]
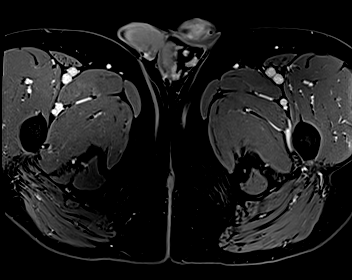
[im 80/80]
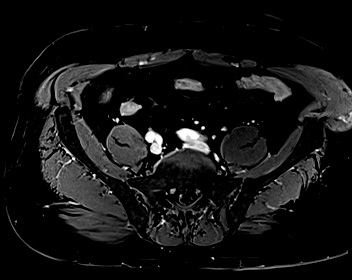

[48 of 48 positions shown; findings below may reference images not displayed]

FINDINGS: Prostate:

-- Peripheral Zone: Diffuse thinning due to central gland
enlargement. No focal lesion seen on ADC or high b-value DWI
sequences.

-- Transition/Central Zone: Enlarged with encapsulated BPH nodules
noted. Marked median lobe enlargement is seen with protrusion into
the urinary bladder. No suspicious nodules are identified on
T2-weighted or diffusion sequences.

-- Measurements/Volume:  8.3 by 4.9 x 5.5 cm (volume = 120 cm^3)

Transcapsular spread:  Absent

Seminal vesicle involvement:  Absent

Neurovascular bundle involvement:  Absent

Pelvic adenopathy: None visualized

Bone metastasis: None visualized

Other: Diffuse bladder wall thickening, consistent with chronic
bladder outlet obstruction.
IMPRESSION: No radiographic evidence of high-grade prostate carcinoma. PI-RADS 1
(v2.1): Very Low (clinically significant cancer highly unlikely)

## 2021-12-12 MED ORDER — GADOBENATE DIMEGLUMINE 529 MG/ML IV SOLN
15.0000 mL | Freq: Once | INTRAVENOUS | Status: AC | PRN
  Administered 2021-12-12: 10:00:00 15 mL via INTRAVENOUS

## 2021-12-18 ENCOUNTER — Other Ambulatory Visit: Payer: Self-pay | Admitting: Emergency Medicine

## 2021-12-18 DIAGNOSIS — I10 Essential (primary) hypertension: Secondary | ICD-10-CM

## 2021-12-21 ENCOUNTER — Other Ambulatory Visit: Payer: Self-pay | Admitting: Allergy

## 2021-12-21 ENCOUNTER — Telehealth: Payer: Self-pay | Admitting: Emergency Medicine

## 2021-12-21 NOTE — Telephone Encounter (Signed)
..  Type of form received: Student Health Form  Additional comments:   Received by:  Tye Savoy should be Faxed/mailed to: (address/ fax #)  Is patient requesting call for pickup:  yes  Form placed:  Up front in Dr. Barry Brunner bin  Attach charge sheet.  Provider will determine charge.  Individual made aware of 3-5 business day turn around Yes?

## 2021-12-23 NOTE — Telephone Encounter (Signed)
Called and scheduled OV for immunizations.

## 2022-01-04 ENCOUNTER — Ambulatory Visit: Payer: BC Managed Care – PPO | Admitting: Emergency Medicine

## 2022-01-12 ENCOUNTER — Encounter: Payer: Self-pay | Admitting: Emergency Medicine

## 2022-01-12 ENCOUNTER — Ambulatory Visit: Payer: BC Managed Care – PPO | Admitting: Emergency Medicine

## 2022-01-12 ENCOUNTER — Other Ambulatory Visit: Payer: Self-pay

## 2022-01-12 VITALS — BP 128/82 | HR 72 | Ht 66.0 in | Wt 192.0 lb

## 2022-01-12 DIAGNOSIS — R7303 Prediabetes: Secondary | ICD-10-CM

## 2022-01-12 DIAGNOSIS — Z87440 Personal history of urinary (tract) infections: Secondary | ICD-10-CM

## 2022-01-12 DIAGNOSIS — N400 Enlarged prostate without lower urinary tract symptoms: Secondary | ICD-10-CM | POA: Diagnosis not present

## 2022-01-12 DIAGNOSIS — E785 Hyperlipidemia, unspecified: Secondary | ICD-10-CM

## 2022-01-12 DIAGNOSIS — I10 Essential (primary) hypertension: Secondary | ICD-10-CM | POA: Diagnosis not present

## 2022-01-12 DIAGNOSIS — Z789 Other specified health status: Secondary | ICD-10-CM | POA: Diagnosis not present

## 2022-01-12 LAB — URINALYSIS
Bilirubin Urine: NEGATIVE
Hgb urine dipstick: NEGATIVE
Ketones, ur: NEGATIVE
Leukocytes,Ua: NEGATIVE
Nitrite: NEGATIVE
Specific Gravity, Urine: >=1.03 — AB (ref 1.000–1.030)
Total Protein, Urine: NEGATIVE
Urine Glucose: NEGATIVE
Urobilinogen, UA: 0.2 (ref 0.0–1.0)
pH: 5.5 (ref 5.0–8.0)

## 2022-01-12 LAB — COMPREHENSIVE METABOLIC PANEL
ALT: 23 U/L (ref 0–53)
AST: 18 U/L (ref 0–37)
Albumin: 4.3 g/dL (ref 3.5–5.2)
Alkaline Phosphatase: 53 U/L (ref 39–117)
BUN: 17 mg/dL (ref 6–23)
CO2: 29 mEq/L (ref 19–32)
Calcium: 9.4 mg/dL (ref 8.4–10.5)
Chloride: 105 mEq/L (ref 96–112)
Creatinine, Ser: 1.08 mg/dL (ref 0.40–1.50)
GFR: 74.56 mL/min (ref >60.00)
Glucose, Bld: 126 mg/dL — ABNORMAL HIGH (ref 70–99)
Potassium: 3.9 mEq/L (ref 3.5–5.1)
Sodium: 141 mEq/L (ref 135–145)
Total Bilirubin: 0.7 mg/dL (ref 0.2–1.2)
Total Protein: 6.8 g/dL (ref 6.0–8.3)

## 2022-01-12 LAB — LIPID PANEL
Cholesterol: 110 mg/dL (ref 0–200)
HDL: 36.2 mg/dL — ABNORMAL LOW (ref >39.00)
LDL Cholesterol: 44 mg/dL (ref 0–99)
NonHDL: 73.57
Total CHOL/HDL Ratio: 3
Triglycerides: 150 mg/dL — ABNORMAL HIGH (ref 0.0–149.0)
VLDL: 30 mg/dL (ref 0.0–40.0)

## 2022-01-12 LAB — HEMOGLOBIN A1C: Hgb A1c MFr Bld: 6 % (ref 4.6–6.5)

## 2022-01-12 LAB — PSA: PSA: 15.33 ng/mL — ABNORMAL HIGH (ref 0.10–4.00)

## 2022-01-12 NOTE — Assessment & Plan Note (Signed)
Stable. Prostate MRI done last December showed the following: Other: Diffuse bladder wall thickening, consistent with chronic bladder outlet obstruction.  IMPRESSION: No radiographic evidence of high-grade prostate carcinoma. PI-RADS 1 (v2.1): Very Low (clinically significant cancer highly unlikely)

## 2022-01-12 NOTE — Patient Instructions (Signed)
Mantenimiento de Teacher, English as a foreign language, Male Adoptar un estilo de vida saludable y recibir atencin preventiva son importantes para promover la salud y Musician. Consulte al mdico sobre: El esquema adecuado para hacerse pruebas y exmenes peridicos. Cosas que puede hacer por su cuenta para prevenir enfermedades y [place] sano. Qu debo saber sobre la dieta, el peso y el ejercicio? Consuma una dieta saludable  Consuma una dieta que incluya muchas verduras, frutas, productos lcteos con bajo contenido de [place] y Advertising account planner. No consuma muchos alimentos ricos en grasas slidas, azcares agregados o sodio. Mantenga un peso saludable El ndice de masa muscular [facility]) es una medida que puede utilizarse para identificar posibles problemas de [place]. Proporciona una estimacin de la grasa corporal basndose en el peso y la altura. Su mdico puede ayudarle a Radiation protection practitioner [place] y a Scientist, forensic o Theatre manager un peso saludable. Haga ejercicio con regularidad Haga ejercicio con regularidad. Esta es una de las prcticas ms importantes que puede hacer por su salud. [employer] de los adultos deben seguir estas pautas: Optometrist, al menos, 150 minutos de actividad fsica por semana. El ejercicio debe aumentar la frecuencia cardaca y Nature conservation officer transpirar (ejercicio de intensidad moderada). Hacer ejercicios de fortalecimiento por lo [employer] por semana. Agregue esto a su plan de ejercicio de intensidad moderada. Pase menos tiempo sentado. Incluso la actividad fsica ligera puede ser beneficiosa. Controle sus niveles de colesterol y lpidos en la sangre Comience a realizarse anlisis de lpidos y Research officer, trade union en la sangre a los 89 aos y luego reptalos cada 5 aos. Es posible que Automotive engineer los niveles de colesterol con mayor frecuencia si: Sus niveles de lpidos y colesterol son altos. Es mayor de 41 aos. Presenta un alto riesgo de padecer enfermedades cardacas. Qu debo  saber sobre las pruebas de deteccin del cncer? Muchos tipos de cncer pueden detectarse de manera temprana y, a menudo, pueden prevenirse. Segn su historia clnica y sus antecedentes familiares, es posible que deba realizarse pruebas de deteccin del cncer en diferentes edades. Esto puede incluir pruebas de deteccin de lo siguiente: Surveyor, minerals. Cncer de prstata. Cncer de piel. Cncer de pulmn. Qu debo saber sobre la enfermedad cardaca, la diabetes y la hipertensin arterial? Presin arterial y enfermedad cardaca La hipertensin arterial causa enfermedades cardacas y [place] el riesgo de accidente cerebrovascular. Es ms probable que esto se manifieste en las personas que tienen lecturas de presin arterial alta o tienen sobrepeso. Hable con el mdico sobre sus valores de presin arterial deseados. Hgase controlar la presin arterial: Cada 3 a 5 aos si tiene entre 18 y 54 aos. Todos los aos si es mayor de 40 aos. Si tiene entre 78 y 54 aos y es fumador o Insurance account manager, pregntele al mdico si debe realizarse una prueba de deteccin de aneurisma artico abdominal (AAA) por nica vez. Diabetes Realcese exmenes de deteccin de la diabetes con regularidad. Este anlisis revisa el nivel de azcar en la sangre en [place]. Hgase las pruebas de deteccin: Cada tres aos despus de los 40 aos de edad si tiene un peso normal y un bajo riesgo de padecer diabetes. Con ms frecuencia y a partir de [place] edad inferior si tiene sobrepeso o un alto riesgo de padecer diabetes. Qu debo saber sobre la prevencin de infecciones? Hepatitis B Si tiene un riesgo ms alto de contraer hepatitis B, debe someterse a un examen de deteccin de este virus. Hable con el mdico para averiguar si tiene riesgo de  contraer la infeccin por hepatitis B. Hepatitis C Se recomienda un anlisis de [place] para: Todos los que nacieron entre 1945 y [phone]. Todas las personas que tengan un riesgo de haber  contrado hepatitis C. Enfermedades de transmisin sexual (ETS) Debe realizarse pruebas de deteccin de ITS todos los aos, incluidas la gonorrea y la clamidia, si: Es sexualmente activo y es menor de 58 aos. Es mayor de 26 aos, y Investment banker, operational informa que corre riesgo de tener este tipo de infecciones. La actividad sexual ha cambiado desde que le hicieron la ltima prueba de deteccin y tiene un riesgo mayor de Best boy clamidia o Radio broadcast assistant. Pregntele al mdico si usted tiene riesgo. Pregntele al mdico si usted tiene un alto riesgo de Museum/gallery curator VIH. El mdico tambin puede recomendarle un medicamento recetado para ayudar a evitar la infeccin por el VIH. Si elige tomar medicamentos para prevenir el VIH, primero debe [employer] de deteccin del VIH. Luego debe hacerse anlisis cada 3 meses mientras est tomando los medicamentos. Siga estas indicaciones en su casa: Consumo de alcohol No beba alcohol si el mdico se lo prohbe. Si bebe alcohol: Limite la cantidad que consume de 0 a 2 bebidas por da. Sepa cunta cantidad de alcohol hay en las bebidas que toma. En los [place], una medida equivale a una botella de cerveza de 12 oz (355 ml), un vaso de vino de 5 oz (148 ml) o un vaso de una bebida alcohlica de alta graduacin de 1 oz (44 ml). Estilo de vida No consuma ningn producto que contenga nicotina o tabaco. Estos productos incluyen cigarrillos, tabaco para Higher education careers adviser y aparatos de vapeo, como los Psychologist, sport and exercise. Si necesita ayuda para dejar de consumir estos productos, consulte al mdico. No consuma drogas. No comparta agujas. Solicite ayuda a su mdico si necesita apoyo o informacin para abandonar las drogas. Indicaciones generales Realcese los estudios de rutina de la salud, dentales y de Public librarian. [place]. Infrmele a su mdico si: Se siente deprimido con frecuencia. Alguna vez ha sido vctima de [place] o no se siente seguro en su  casa. Resumen Adoptar un estilo de vida saludable y recibir atencin preventiva son importantes para promover la salud y Musician. Siga las instrucciones del mdico acerca de una dieta saludable, el ejercicio y la realizacin de pruebas o exmenes para Engineer, building services. Siga las instrucciones del mdico con respecto al control del colesterol y la presin arterial. Esta informacin no tiene Marine scientist el consejo del mdico. Asegrese de hacerle al mdico cualquier pregunta que tenga. Document Revised: 05/19/2021 Document Reviewed: 05/19/2021 Elsevier Patient Education  2022 [employer].

## 2022-01-12 NOTE — Assessment & Plan Note (Signed)
Stable.  Diet and nutrition discussed.  A1c done today.

## 2022-01-12 NOTE — Progress Notes (Signed)
[facility] 61 y.o.   Chief Complaint  Patient presents with   Immunizations    Work form filled out.MMR,Varicella    HISTORY OF PRESENT ILLNESS: This is a 61 y.o. male needs MMR and varicella titers. Also has history of hypertension and enlarged prostate. Recently had UTI treated with antibiotics. Elevated PSA was evaluated by urologist.  MRI done did not show malignancy. Also recently had COVID infection. No other complaints or medical concerns today.  HPI   Prior to Admission medications   Medication Sig Start Date End Date Taking? Authorizing Provider  albuterol (VENTOLIN HFA) 108 (90 Base) MCG/ACT inhaler INHALE 2 PUFFS INTO THE LUNGS EVERY 4 HOURS AS NEEDED FOR WHEEZING OR SHORTNESS OF BREATH(COUGHING FITS) 12/21/21   Kozlow, Donnamarie Poag, MD  amLODipine (NORVASC) 5 MG tablet TAKE 1 TABLET(5 MG) BY MOUTH DAILY 12/21/21   Horald Pollen, MD  aspirin EC 325 MG tablet Take 325 mg by mouth as needed. Patient not taking: Reported on 11/02/2021    [provider]  chlorpheniramine-HYDROcodone (TUSSIONEX PENNKINETIC ER) 10-8 MG/5ML SUER Take 5 mLs by mouth every 12 (twelve) hours as needed for cough. 11/02/21   Kozlow, Donnamarie Poag, MD  ciprofloxacin (CIPRO) 500 MG tablet Take 1 tablet (500 mg total) by mouth every 12 (twelve) hours. 10/31/21   Hazel Sams, PA-C  clobetasol (OLUX) 0.05 % topical foam APPLY EXTERNALLY TO THE AFFECTED AREA TWICE DAILY 03/31/20   Jacelyn Pi, Lilia Argue, MD  clobetasol ointment (TEMOVATE) 0.05 % APPLY EXTERNALLY TO THE AFFECTED AREA TWICE DAILY 04/17/20   Jacelyn Pi, Lilia Argue, MD  EPINEPHrine (AUVI-Q) 0.3 mg/0.3 mL IJ SOAJ injection Inject 0.3 mg into the muscle as needed for anaphylaxis. Patient not taking: No sig reported 12/01/20   Garnet Sierras, DO  fluocinolone (VANOS) 0.01 % cream Apply topically daily. 12/06/19   Jacelyn Pi, Lilia Argue, MD  fluocinonide cream (LIDEX) 0.05 % APPLY SPARINGLY EXTERNALLY TO THE AFFECTED AREA TWICE DAILY Patient not  taking: Reported on 11/02/2021 02/02/20   Jacelyn Pi, Lilia Argue, MD  Fluticasone Furoate (ARNUITY ELLIPTA) 100 MCG/ACT AEPB Inhale 1 puff into the lungs daily. Rinse mouth after each use. Patient not taking: Reported on 11/02/2021 04/05/21   Garnet Sierras, DO  gabapentin (NEURONTIN) 100 MG capsule Take 100 mg by mouth 3 (three) times daily. Patient not taking: Reported on 11/02/2021    [provider]  hydrocortisone (ANUSOL-HC) 2.5 % rectal cream Place 1 application rectally 2 (two) times daily. Patient not taking: Reported on 11/02/2021 12/15/20   Horald Pollen, MD  hyoscyamine (LEVSIN SL) 0.125 MG SL tablet Place 1 tablet (0.125 mg total) under the tongue every 8 (eight) hours as needed. Patient not taking: Reported on 11/02/2021 08/20/19   Willia Craze, NP  ibuprofen (ADVIL) 600 MG tablet Take 1 tablet (600 mg total) by mouth every 6 (six) hours as needed. 06/03/20   Melynda Ripple, MD  ketoconazole (NIZORAL) 2 % shampoo APPLY EXTERNALLY 2 TIMES A WEEK. 11/25/17   [name], MD  lisinopril (ZESTRIL) 40 MG tablet Take 1 tablet (40 mg total) by mouth daily. 07/07/21   Horald Pollen, MD  loratadine (CLARITIN) 10 MG tablet Take 1 tablet (10 mg total) by mouth 2 (two) times daily. 11/02/21   Kozlow, Donnamarie Poag, MD  mometasone (NASONEX) 50 MCG/ACT nasal spray Place 1-2 sprays into the nose daily as needed (nasal symptoms). 11/02/21   Kozlow, Donnamarie Poag, MD  montelukast (SINGULAIR) 10 MG  tablet TAKE 1 TABLET(10 MG) BY MOUTH AT BEDTIME 08/28/20   Lauraine Rinne, NP  Olopatadine HCl 0.2 % SOLN Apply 1 drop to eye daily as needed (itchy/watery eyes). 11/02/21   Kozlow, Donnamarie Poag, MD  rosuvastatin (CRESTOR) 10 MG tablet TAKE 1 TABLET(10 MG) BY MOUTH DAILY 11/22/21   Horald Pollen, MD  sildenafil (VIAGRA) 100 MG tablet Take 0.5-1 tablets (50-100 mg total) by mouth daily as needed for erectile dysfunction. Patient not taking: No sig reported 07/09/21   Horald Pollen, MD    No Known  Allergies  Patient Active Problem List   Diagnosis Date Noted   Seasonal and perennial allergic rhinoconjunctivitis 04/05/2021   Moderate persistent asthma without complication 95/08/3266   Prediabetes 04/02/2019   Hyperlipidemia 12/23/2014   Psoriasis 06/27/2014   Multiple lung nodules 12/09/2011   PULMONARY HYPERTENSION 03/04/2010   Essential hypertension 02/10/2010   Asthma in adult 02/10/2010   OBSTRUCTIVE SLEEP APNEA 02/04/2010    Past Medical History:  Diagnosis Date   Allergy    Flonase, Patanol, Zyrtec   Arthritis    psoriasis   Asthma    seasonal, with allergies   Borderline diabetes    Cluster headache    Colon polyp 10/27/2011   Depression    denies 11/11/16; denies 11/21/18 "been years"   Eczema    GERD (gastroesophageal reflux disease)    History of kidney stones    x1   Hyperlipidemia    Hypertension    Low back pain    "in kidney area-was told that it was related to gallstones"   Prediabetes    Psoriasis    [name]   Severe sleep apnea    h/o; had procedure and weight control, does not have to wear CPAP   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 Repeat 5 years.   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   Marital status: same sexual partner 4 X years.   Children:  1 child/son (18) at [employer] son's mom is a Pharmacist, hospital in 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        Tobacco: never      Alcohol: none      Drugs: none       Exercise: daily in 2018; 10,000 steps daily; lots of walking on campus at work      ADLs: independent with ADLs.        Sexual activity: dating males only in 2018; bisexual.  Previously married to male.       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         Social Determinants of Health   Financial Resource Strain: Not on file  Food Insecurity: Not on file  Transportation Needs: Not on file  Physical Activity: Not on file  Stress: Not on file  Social Connections: Not on file  Intimate Partner Violence: Not on file    Family History  Problem Relation Age of Onset   Stroke Mother    Asthma Sister    Colon cancer Neg Hx    Esophageal cancer Neg Hx    Stomach cancer Neg Hx    Prostate cancer Neg Hx    Diabetes Neg Hx    CAD Neg Hx    Migraines Neg Hx    Headache Neg Hx      Review of Systems  Constitutional: Negative.  Negative for chills and fever.  HENT: Negative.  Negative for congestion and sore throat.   Respiratory: Negative.   Negative for cough and shortness of breath.   Cardiovascular: Negative.  Negative for chest pain and palpitations.  Gastrointestinal: Negative.  Negative for abdominal pain, blood in stool, diarrhea, melena, nausea and vomiting.  Genitourinary: Negative.  Negative for dysuria and hematuria.  Skin: Negative.  Negative for rash.  Neurological: Negative.  Negative for dizziness and headaches.  All other systems reviewed and are negative.  Today's Vitals   01/12/22 1346  BP: 128/82  Pulse: 72  SpO2: 96%  Weight: 192 lb (87.1 kg)  Height: 5' 6"  (1.676 m)   Body mass index is 30.99 kg/m.  Physical Exam Vitals reviewed.  Constitutional:      Appearance: Normal appearance.  HENT:     Head: Normocephalic.  Eyes:     Extraocular Movements: Extraocular movements intact.     Pupils: Pupils are equal, round, and reactive to light.  Cardiovascular:     Rate and Rhythm: Normal rate.  Pulmonary:     Effort: Pulmonary effort is normal.  Musculoskeletal:        General: Normal range of motion.     Cervical back: No tenderness.  Skin:    General: Skin is warm and dry.     Capillary Refill: Capillary refill takes less than 2 seconds.  Neurological:     General: No focal deficit present.     Mental Status: He is alert and oriented to person, place, and time.  Psychiatric:        Mood and Affect: Mood normal.        Behavior: Behavior normal.     ASSESSMENT & PLAN: Problem List Items Addressed This Visit       Cardiovascular and Mediastinum   Essential hypertension - Primary    Well-controlled hypertension.  Continue lisinopril 40 mg and amlodipine 5 mg daily. BP Readings from Last 3 Encounters:  01/12/22  128/82  11/02/21 130/72  10/31/21 132/86         Relevant Orders   Comprehensive metabolic panel     Other   Dyslipidemia    Diet and nutrition discussed.  Continue rosuvastatin 10 mg daily. Lipid profile done today.       Relevant Orders   Lipid panel    Prediabetes    Stable.  Diet and nutrition discussed.  A1c done today.      Relevant Orders   Hemoglobin A1c   Enlarged prostate    Stable. Prostate MRI done last December showed the following: Other: Diffuse bladder wall thickening, consistent with chronic bladder outlet obstruction.   IMPRESSION: No radiographic evidence of high-grade prostate carcinoma. PI-RADS 1 (v2.1): Very Low (clinically significant cancer highly unlikely)          Relevant Orders   PSA   Urine Culture   Other Visit Diagnoses     Measles, mumps, rubella (MMR) vaccination status unknown       Relevant Orders   Measles/Mumps/Rubella Immunity   Varicella vaccination status unknown       Relevant Orders   Varicella zoster antibody, IgG   Recent urinary tract infection       Relevant Orders   Urine Culture   Urinalysis      Patient Instructions  Mantenimiento de la salud en los hombres Health Maintenance, Male Adoptar un estilo de vida saludable y recibir atencin preventiva son importantes para promover la salud y Musician. Consulte al mdico sobre: El esquema adecuado para hacerse pruebas y exmenes peridicos. Cosas que puede hacer por su cuenta para prevenir enfermedades y [place] sano. Qu debo saber sobre la dieta, el peso y el ejercicio? Consuma una dieta saludable  Consuma una dieta que incluya muchas verduras, frutas, productos lcteos con bajo contenido de [place] y Advertising account planner. No consuma muchos alimentos ricos en grasas slidas, azcares agregados o sodio. Mantenga un peso saludable El ndice de masa muscular [facility]) es una medida que puede utilizarse para identificar posibles problemas de [place]. Proporciona una estimacin de la grasa corporal basndose en el peso y la altura. Su mdico puede ayudarle a Radiation protection practitioner [place] y a Scientist, forensic o Theatre manager un peso saludable. Haga ejercicio con regularidad Haga ejercicio con regularidad. Esta es una de las prcticas ms importantes que puede  hacer por su salud. [employer] de los adultos deben seguir estas pautas: Optometrist, al menos, 150 minutos de actividad fsica por semana. El ejercicio debe aumentar la frecuencia cardaca y Nature conservation officer transpirar (ejercicio de intensidad moderada). Hacer ejercicios de fortalecimiento por lo [employer] por semana. Agregue esto a su plan de ejercicio de intensidad moderada. Pase menos tiempo sentado. Incluso la actividad fsica ligera puede ser beneficiosa. Controle sus niveles de colesterol y lpidos en la sangre Comience a realizarse anlisis de lpidos y Research officer, trade union en la sangre a los 42 aos y luego reptalos cada 5 aos. Es posible que Automotive engineer los niveles de colesterol con mayor frecuencia si: Sus niveles de lpidos y colesterol son altos. Es mayor de 83 aos. Presenta un alto riesgo de padecer enfermedades cardacas. Qu debo saber sobre las pruebas de deteccin del cncer? Muchos tipos de cncer pueden detectarse de manera temprana y, a menudo, pueden prevenirse. Segn su historia clnica y sus antecedentes familiares, es posible que deba realizarse pruebas de deteccin del cncer en diferentes edades. Esto puede incluir pruebas de deteccin de lo siguiente: Surveyor, minerals. Cncer de prstata. Cncer de piel. Cncer de  pulmn. Qu debo saber sobre la enfermedad cardaca, la diabetes y la hipertensin arterial? Presin arterial y enfermedad cardaca La hipertensin arterial causa enfermedades cardacas y [place] el riesgo de accidente cerebrovascular. Es ms probable que esto se manifieste en las personas que tienen lecturas de presin arterial alta o tienen sobrepeso. Hable con el mdico sobre sus valores de presin arterial deseados. Hgase controlar la presin arterial: Cada 3 a 5 aos si tiene entre 18 y 21 aos. Todos los aos si es mayor de 40 aos. Si tiene entre 32 y 66 aos y es fumador o Insurance account manager, pregntele al mdico si debe realizarse una prueba de deteccin  de aneurisma artico abdominal (AAA) por nica vez. Diabetes Realcese exmenes de deteccin de la diabetes con regularidad. Este anlisis revisa el nivel de azcar en la sangre en [place]. Hgase las pruebas de deteccin: Cada tres aos despus de los 27 aos de edad si tiene un peso normal y un bajo riesgo de padecer diabetes. Con ms frecuencia y a partir de [place] edad inferior si tiene sobrepeso o un alto riesgo de padecer diabetes. Qu debo saber sobre la prevencin de infecciones? Hepatitis B Si tiene un riesgo ms alto de contraer hepatitis B, debe someterse a un examen de deteccin de este virus. Hable con el mdico para averiguar si tiene riesgo de contraer la infeccin por hepatitis B. Hepatitis C Se recomienda un anlisis de [place] para: Todos los que nacieron entre 1945 y [phone]. Todas las personas que tengan un riesgo de haber contrado hepatitis C. Enfermedades de transmisin sexual (ETS) Debe realizarse pruebas de deteccin de ITS todos los aos, incluidas la gonorrea y la clamidia, si: Es sexualmente activo y es menor de 69 aos. Es mayor de 33 aos, y Investment banker, operational informa que corre riesgo de tener este tipo de infecciones. La actividad sexual ha cambiado desde que le hicieron la ltima prueba de deteccin y tiene un riesgo mayor de Best boy clamidia o Radio broadcast assistant. Pregntele al mdico si usted tiene riesgo. Pregntele al mdico si usted tiene un alto riesgo de Museum/gallery curator VIH. El mdico tambin puede recomendarle un medicamento recetado para ayudar a evitar la infeccin por el VIH. Si elige tomar medicamentos para prevenir el VIH, primero debe [employer] de deteccin del VIH. Luego debe hacerse anlisis cada 3 meses mientras est tomando los medicamentos. Siga estas indicaciones en su casa: Consumo de alcohol No beba alcohol si el mdico se lo prohbe. Si bebe alcohol: Limite la cantidad que consume de 0 a 2 bebidas por da. Sepa cunta cantidad de alcohol hay en las bebidas que  toma. En los [place], una medida equivale a una botella de cerveza de 12 oz (355 ml), un vaso de vino de 5 oz (148 ml) o un vaso de una bebida alcohlica de alta graduacin de 1 oz (44 ml). Estilo de vida No consuma ningn producto que contenga nicotina o tabaco. Estos productos incluyen cigarrillos, tabaco para Higher education careers adviser y aparatos de vapeo, como los Psychologist, sport and exercise. Si necesita ayuda para dejar de consumir estos productos, consulte al mdico. No consuma drogas. No comparta agujas. Solicite ayuda a su mdico si necesita apoyo o informacin para abandonar las drogas. Indicaciones generales Realcese los estudios de rutina de la salud, dentales y de Public librarian. [place]. Infrmele a su mdico si: Se siente deprimido con frecuencia. Alguna vez ha sido vctima de [place] o no se siente seguro en su casa. Resumen Adoptar un estilo de  vida saludable y recibir atencin preventiva son importantes para promover la salud y Musician. Siga las instrucciones del mdico acerca de una dieta saludable, el ejercicio y la realizacin de pruebas o exmenes para Engineer, building services. Siga las instrucciones del mdico con respecto al control del colesterol y la presin arterial. Esta informacin no tiene Marine scientist el consejo del mdico. Asegrese de hacerle al mdico cualquier pregunta que tenga. Document Revised: 05/19/2021 Document Reviewed: 05/19/2021 Elsevier Patient Education  2022 [place], [place] [facility]

## 2022-01-12 NOTE — Assessment & Plan Note (Signed)
Well-controlled hypertension.  Continue lisinopril 40 mg and amlodipine 5 mg daily. BP Readings from Last 3 Encounters:  01/12/22 128/82  11/02/21 130/72  10/31/21 132/86

## 2022-01-12 NOTE — Assessment & Plan Note (Signed)
Diet and nutrition discussed.  Continue rosuvastatin 10 mg daily. Lipid profile done today.

## 2022-01-13 ENCOUNTER — Ambulatory Visit: Payer: BC Managed Care – PPO | Admitting: Emergency Medicine

## 2022-01-14 LAB — MEASLES/MUMPS/RUBELLA IMMUNITY
Mumps IgG: 79 AU/mL
Rubella: 4.86 Index
Rubeola IgG: 73.1 AU/mL

## 2022-01-14 LAB — URINE CULTURE: Result:: NO GROWTH

## 2022-01-14 LAB — VARICELLA ZOSTER ANTIBODY, IGG: Varicella IgG: 283.5 index

## 2022-02-10 LAB — HM DIABETES EYE EXAM

## 2022-02-14 ENCOUNTER — Other Ambulatory Visit: Payer: Self-pay | Admitting: Emergency Medicine

## 2022-03-01 ENCOUNTER — Encounter: Payer: Self-pay | Admitting: Emergency Medicine

## 2022-03-19 ENCOUNTER — Other Ambulatory Visit: Payer: Self-pay | Admitting: Emergency Medicine

## 2022-03-19 DIAGNOSIS — K644 Residual hemorrhoidal skin tags: Secondary | ICD-10-CM

## 2022-03-28 ENCOUNTER — Ambulatory Visit: Payer: BC Managed Care – PPO | Admitting: Internal Medicine

## 2022-03-28 ENCOUNTER — Encounter: Payer: Self-pay | Admitting: Internal Medicine

## 2022-03-28 VITALS — BP 110/70 | HR 74 | Temp 97.9°F | Resp 18

## 2022-03-28 DIAGNOSIS — H1013 Acute atopic conjunctivitis, bilateral: Secondary | ICD-10-CM | POA: Diagnosis not present

## 2022-03-28 DIAGNOSIS — H1010 Acute atopic conjunctivitis, unspecified eye: Secondary | ICD-10-CM

## 2022-03-28 DIAGNOSIS — J4530 Mild persistent asthma, uncomplicated: Secondary | ICD-10-CM | POA: Diagnosis not present

## 2022-03-28 DIAGNOSIS — H1045 Other chronic allergic conjunctivitis: Secondary | ICD-10-CM

## 2022-03-28 DIAGNOSIS — J302 Other seasonal allergic rhinitis: Secondary | ICD-10-CM

## 2022-03-28 MED ORDER — OLOPATADINE HCL 0.1 % OP SOLN: 1.0000 [drp] | Freq: Two times a day (BID) | OPHTHALMIC | 12 refills | Status: DC

## 2022-03-28 MED ORDER — AZELASTINE HCL 0.1 % NA SOLN: 1.0000 | Freq: Two times a day (BID) | NASAL | 12 refills | Status: DC

## 2022-03-28 MED ORDER — CETIRIZINE HCL 10 MG PO CAPS: 10.0000 mg | ORAL_CAPSULE | Freq: Every day | ORAL | 3 refills | Status: DC

## 2022-03-28 MED ORDER — FLUTICASONE PROPIONATE HFA 110 MCG/ACT IN AERO: 1.0000 | INHALATION_SPRAY | Freq: Two times a day (BID) | RESPIRATORY_TRACT | 12 refills | Status: DC

## 2022-03-28 MED ORDER — FLUTICASONE PROPIONATE 50 MCG/ACT NA SUSP: 2.0000 | Freq: Every day | NASAL | 2 refills | Status: DC

## 2022-03-28 NOTE — Patient Instructions (Addendum)
Mild persistent Asthma: Not well controlled ?- Breathing test today not done due to acute respiratory infection ?-COVID-19 BINAX testing was negative today ?-I suspect your symptoms are due to stopping allergy injections as well as most of the your controller therapy and the onset of allergy season ? ?PLAN:  ?- Daily controller medication(s):  Flovent 149mg 1 puff twice daily  and Singulair 10 mg daily ?- Prior to physical activity: albuterol 2 puffs 10-15 minutes before physical activity. ?- Rescue medications: albuterol 4 puffs every 4-6 hours as needed ?- Changes during respiratory infections or worsening symptoms: Increase Arnuity to 2 puffs twice daily for TWO WEEKS. ?- Get Influenza Vaccine and appropriate Pneumonia and COVID 19 boosters  ?- Asthma control goals:  ?* Full participation in all desired activities (may need albuterol before activity) ?* Albuterol use two time or less a week on average (not counting use with activity) ?* Cough interfering with sleep two time or less a month ?* Oral steroids no more than once a year ?* No hospitalizations ? ?Seasonal and Perennial Rhinitis: not well controlled  ?-Continue avoidance measures for grass, tree, dust mite, mold ?- Continue with: Singulair (montelukast) '10mg'$  daily and Flonase (fluticasone) two sprays per nostril daily ?- Start taking: Zyrtec (cetirizine) '10mg'$  tablet once daily, Astelin (azelastine) 2 sprays per nostril 1-2 times daily as needed, and Pataday (olopatadine) one drop per eye twice daily as needed ?- You can use an extra dose of the antihistamine, if needed, for breakthrough symptoms.  ?- Consider nasal saline rinses 1-2 times daily to remove allergens from the nasal cavities as well as help with mucous clearance (this is especially helpful to do before the nasal sprays are given) ?- Consider allergy shots as a means of long-term control and can reduce lifetime use of medications  ?- Allergy shots "re-train" and "reset" the immune system to  ignore environmental allergens and decrease the resulting immune response to those allergens (sneezing, itchy watery eyes, runny nose, nasal congestion, etc).    ?- Allergy shots improve symptoms in 75-85%  ?- Allergy shots are the only potential permanent and disease modifying option  ?- We can discuss more at the next appointment if the medications are not working for you. ? ?Follow up: 6 weeks  ? ?Thank you so much for letting me partake in your care today.  Don't hesitate to reach out if you have any additional concerns! ? ?ERoney [name] MD  ?[facility] [place] ? ? ?

## 2022-03-28 NOTE — Progress Notes (Signed)
? ?Follow Up Note ? ?RE: Rickey Cruz MRN: [id number] DOB: 15-Jun-1961 ?Date of Office Visit: 03/28/2022 ? ?Referring provider: Horald Pollen, * ?Primary care provider: Horald Pollen, MD ? ?Chief Complaint: Cough (/) ? ?History of Present Illness: ?I had the pleasure of seeing Rickey Cruz for a follow up visit at the [facility] on 03/28/2022. He is a 61 y.o. male, who is being followed for allergic rhinoconjunctivitis and persistent asthma. His previous allergy office visit was on 11/822 with Dr. Neldon Mc.  At that visit he was diagnosed with COVID-19 infection.  He does have a history of for COVID-19 vaccinations.  He was treated with Paxlovid with good recovery.  Today is a  acute visit due to concern for COVID 19 infection . 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   ? ?Allergic Rhinitis ?- Medical therapy: Flonase, Singulair, Pataday are prescribed, but he is only taking singulair regularly.  He recently bought OTC flonase which he started yesterday.  ?- Symptoms: started one week ago with cough, itchy ears, throat tightness.  Denies any fevers.  Also had itchy watery eyes for a few weeks prior.  ?- Adverse effects of medications: Denies  ?- Allergy testing history: Skin testing in 2005 was positive to grass, weed, trees, cat and dog.  Patient was on allergy immunotherapy for a few years with good benefit.  2021 skin testing showed: Positive to grass, trees, mold, dust mites. ?- Immunotherapy: Started in December 2021.  Vial 1 grass, trees; vial 2 old, dust mite: Last injection 10/29/2021 1: 100 red vial at 0.05 mL ?- Large Local Reactions: Denies  ?- Systemic Reactions: Denies ?- Beta Blockers: Denies beta-blockers, on amlodipine ?- History of Reflux Denies  ?- History of Sinus Surgery Denies  ? ?ASTHMA ?- Medical therapy: Arnuity 100 mcg 1 puff daily (not taking), Singulair 10 mg daily ?- Rescue inhaler use: Has not used for current symptoms  ?- Symptoms: now with chest  tightness and cough - is not using rescue inhaler for current symptoms  ?- Exacerbation history: 0 ABX for respiratory illness since last visit, 0 OCS, 0ED, 0 UC visits in the past year for asthma.   ?- Adverse effects of medication: unknown  ?- Previous FEV1: 3.18 L, 100% ? ? ? ?Assessment and Plan: ?Rickey Cruz is a 61 y.o. male with: ?Mild persistent asthma without complication ? ?Seasonal and perennial allergic rhinoconjunctivitis ? ?Chronic allergic conjunctivitis ?Plan: ?Patient Instructions  ?Mild persistent Asthma: Not well controlled ?- Breathing test today not done due to acute respiratory infection ?-COVID-19 BINAX testing was negative today ?-I suspect your symptoms are due to stopping allergy injections as well as most of the your controller therapy and the onset of allergy season ? ?PLAN:  ?- Daily controller medication(s):  Flovent 139mg 1 puff twice daily  and Singulair 10 mg daily ?- Prior to physical activity: albuterol 2 puffs 10-15 minutes before physical activity. ?- Rescue medications: albuterol 4 puffs every 4-6 hours as needed ?- Changes during respiratory infections or worsening symptoms: Increase Arnuity to 2 puffs twice daily for TWO WEEKS. ?- Get Influenza Vaccine and appropriate Pneumonia and COVID 19 boosters  ?- Asthma control goals:  ?* Full participation in all desired activities (may need albuterol before activity) ?* Albuterol use two time or less a week on average (not counting use with activity) ?* Cough interfering with sleep two time or less a month ?* Oral steroids no more than once a year ?*  No hospitalizations ? ?Seasonal and Perennial Rhinitis: not well controlled  ?-Continue avoidance measures for grass, tree, dust mite, mold ?- Continue with: Singulair (montelukast) '10mg'$  daily and Flonase (fluticasone) two sprays per nostril daily ?- Start taking: Zyrtec (cetirizine) '10mg'$  tablet once daily, Astelin (azelastine) 2 sprays per nostril 1-2 times daily as needed, and Pataday  (olopatadine) one drop per eye twice daily as needed ?- You can use an extra dose of the antihistamine, if needed, for breakthrough symptoms.  ?- Consider nasal saline rinses 1-2 times daily to remove allergens from the nasal cavities as well as help with mucous clearance (this is especially helpful to do before the nasal sprays are given) ?- Consider allergy shots as a means of long-term control and can reduce lifetime use of medications  ?- Allergy shots "re-train" and "reset" the immune system to ignore environmental allergens and decrease the resulting immune response to those allergens (sneezing, itchy watery eyes, runny nose, nasal congestion, etc).    ?- Allergy shots improve symptoms in 75-85%  ?- Allergy shots are the only potential permanent and disease modifying option  ?- We can discuss more at the next appointment if the medications are not working for you. ? ?Follow up: 6 weeks  ? ?Thank you so much for letting me partake in your care today.  Don't hesitate to reach out if you have any additional concerns! ? ?Roney [name], MD  ?[facility] [place] ? ? ?Return in about 6 weeks (around 05/09/2022). ? ?Meds ordered this encounter  ?Medications  ? fluticasone (FLONASE) 50 MCG/ACT nasal spray  ?  Sig: Place 2 sprays into both nostrils daily.  ?  Dispense:  16 g  ?  Refill:  2  ? azelastine (ASTELIN) 0.1 % nasal spray  ?  Sig: Place 1 spray into both nostrils 2 (two) times daily. Use in each nostril as directed  ?  Dispense:  30 mL  ?  Refill:  12  ? fluticasone (FLOVENT HFA) 110 MCG/ACT inhaler  ?  Sig: Inhale 1 puff into the lungs in the morning and at bedtime.  ?  Dispense:  1 each  ?  Refill:  12  ? Cetirizine HCl 10 MG CAPS  ?  Sig: Take 1 capsule (10 mg total) by mouth daily.  ?  Dispense:  90 capsule  ?  Refill:  3  ? olopatadine (PATANOL) 0.1 % ophthalmic solution  ?  Sig: Place 1 drop into both eyes 2 (two) times daily.  ?  Dispense:  5 mL  ?  Refill:  12  ? ? ?Lab Orders   ?No laboratory test(s) ordered today  ? ?Diagnostics: ?None Performed  ? ? ?Medication List:  ?Current Outpatient Medications  ?Medication Sig Dispense Refill  ? albuterol (VENTOLIN HFA) 108 (90 Base) MCG/ACT inhaler INHALE 2 PUFFS INTO THE LUNGS EVERY 4 HOURS AS NEEDED FOR WHEEZING OR SHORTNESS OF BREATH(COUGHING FITS) 8.5 g 1  ? amLODipine (NORVASC) 5 MG tablet TAKE 1 TABLET(5 MG) BY MOUTH DAILY 90 tablet 3  ? aspirin EC 325 MG tablet Take 325 mg by mouth as needed.    ? azelastine (ASTELIN) 0.1 % nasal spray Place 1 spray into both nostrils 2 (two) times daily. Use in each nostril as directed 30 mL 12  ? Cetirizine HCl 10 MG CAPS Take 1 capsule (10 mg total) by mouth daily. 90 capsule 3  ? chlorpheniramine-HYDROcodone (TUSSIONEX PENNKINETIC ER) 10-8 MG/5ML SUER Take 5 mLs by mouth every  12 (twelve) hours as needed for cough. 140 mL 0  ? ciprofloxacin (CIPRO) 500 MG tablet Take 1 tablet (500 mg total) by mouth every 12 (twelve) hours. 10 tablet 0  ? clobetasol (OLUX) 0.05 % topical foam APPLY EXTERNALLY TO THE AFFECTED AREA TWICE DAILY 50 g 3  ? clobetasol ointment (TEMOVATE) 0.05 % APPLY EXTERNALLY TO THE AFFECTED AREA TWICE DAILY 30 g 3  ? EPINEPHrine (AUVI-Q) 0.3 mg/0.3 mL IJ SOAJ injection Inject 0.3 mg into the muscle as needed for anaphylaxis. 1 each 2  ? fluocinolone (VANOS) 0.01 % cream Apply topically daily. 30 g 1  ? fluocinonide cream (LIDEX) 0.05 % APPLY SPARINGLY EXTERNALLY TO THE AFFECTED AREA TWICE DAILY 30 g 1  ? fluticasone (FLONASE) 50 MCG/ACT nasal spray Place 2 sprays into both nostrils daily. 16 g 2  ? fluticasone (FLOVENT HFA) 110 MCG/ACT inhaler Inhale 1 puff into the lungs in the morning and at bedtime. 1 each 12  ? Fluticasone Furoate (ARNUITY ELLIPTA) 100 MCG/ACT AEPB Inhale 1 puff into the lungs daily. Rinse mouth after each use. 30 each 5  ? gabapentin (NEURONTIN) 100 MG capsule Take 100 mg by mouth 3 (three) times daily.    ? hydrocortisone (ANUSOL-HC) 2.5 % rectal cream APPLY  RECTALLY TO THE AFFECTED AREA TWICE DAILY 30 g 5  ? hyoscyamine (LEVSIN SL) 0.125 MG SL tablet Place 1 tablet (0.125 mg total) under the tongue every 8 (eight) hours as needed. 30 tablet 0  ? ibuprofen (ADVIL) 600 MG t

## 2022-04-14 NOTE — Progress Notes (Signed)
GU Location of Tumor / Histology: Prostate Ca ? ?If Prostate Cancer, Gleason Score is (3 + 3) and PSA is (19.40 as of 10/2021) ? ?Biopsies: ?Dr. Claudia Desanctis ? ? ? ? ?Past/Anticipated interventions by urology, if any: NA ? ?Past/Anticipated interventions by medical oncology, if any:  ?NA ? ?Weight changes, if any:  No ? ?IPSS:   24 ?SHIM:  20 ? ?Bowel/Bladder complaints, if any:  No ? ?Nausea/Vomiting, if any: No ? ?Pain issues, if any:  2/10 left side abdomen and lower back. ? ?SAFETY ISSUES: ? ?Prior radiation? No ?Pacemaker/ICD? No ?Possible current pregnancy? Male ?Is the patient on methotrexate? No ? ?Current Complaints / other details:  Need more information on treatment options. ?

## 2022-04-18 NOTE — Progress Notes (Signed)
?Radiation Oncology         [phone] ?________________________________ ? ?Initial Outpatient Consultation ? ?Name: Rickey Cruz MRN: [id number]  ?Date: 04/19/2022  DOB: Jun 04, 1961 ? ?UK:GURKYHCW, Ines Bloomer, MD  Robley Fries, MD  ? ?REFERRING PHYSICIAN: Robley Fries, MD ? ?DIAGNOSIS: 61 y.o. gentleman with Stage T1c adenocarcinoma of the prostate with Gleason score of 3+3, and PSA of 10.2. ? ?  ICD-10-CM   ?1. Malignant neoplasm of prostate ([place])  C61   ?  ? ? ?HISTORY OF PRESENT ILLNESS: Rickey Cruz is a 61 y.o. male with a diagnosis of prostate cancer. He has been followed by [facility] since at least 2015, initially with Dr. Lowella Bandy, for an elevated PSA. His initial biopsy in 10/2014 for an elevated PSA of 4.44 showed only one area of atypia. He has remained under active surveillance since that time with a repeat prostate biopsy in 04/2015 under the care of Dr. Tresa Moore, that again only showed atypia at the right base. His PSA has fluctuated but remained overall stable until a sudden increase from 6.4 in 11/2019 up to 14.5 in 06/2021. His DRE remained without any nodularity and a surveillance prostate MRI in 12/2019 was negative for any high grade or concerning lesions. He presented to the urology clinic on 09/21/2021, with left flank pain but there was no evidence of obstructing stones on CT A/P so he was treated for UTI/prostatitis with antibiotics and started on Flomax.  His flank pain resolved and his LUTS improved significantly.  A repeat PSA on 10/25/21 had slightly decreased to 12.4 but increased to 19.4 on follow-up labs on 11/16/2021.  Therefore, he underwent prostate MRI on 12/12/21 but this was again without evidence of high-grade prostate carcinoma (PI-RADS 1). ? ?The patient proceeded to repeat transrectal ultrasound with 12 biopsies of the prostate on 03/23/22 under the care of Dr. Claudia Desanctis.  The prostate volume measured 92 cc.  Out of 12 core biopsies, 2 were positive.  The maximum Gleason  score was 3+3, and this was seen in the right apex lateral and left mid (both with involvement of 5% or less). ? ?The patient met with Dr. Louis Meckel yesterday, 04/18/22, to discuss his surgical treatment options. A repeat PSA was obtained at that time and had decreased to 10.2.  Given the reassuring findings on recent prostate MRI, biopsy and decrease in the PSA, his recommendation was to proceed in active surveillance. ? ?The patient reviewed the biopsy, imaging and PSA results with his urologist and he has kindly been referred today for discussion of potential radiation treatment options. ? ? ?PREVIOUS RADIATION THERAPY: No ? ?PAST MEDICAL HISTORY:  ?Past Medical History:  ?Diagnosis Date  ? Allergy   ? Flonase, Patanol, Zyrtec  ? Arthritis   ? psoriasis  ? Asthma   ? seasonal, with allergies  ? Borderline diabetes   ? Cluster headache   ? Colon polyp 10/27/2011  ? Depression   ? denies 11/11/16; denies 11/21/18 "been years"  ? Eczema   ? GERD (gastroesophageal reflux disease)   ? History of kidney stones   ? x1  ? Hyperlipidemia   ? Hypertension   ? Low back pain   ? "in kidney area-was told that it was related to gallstones"  ? Prediabetes   ? Psoriasis   ? [name]  ? Severe sleep apnea   ? h/o; had procedure and weight control, does not have to wear CPAP  ? Tuberculosis   ? granuloma on  lungs, 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  ?   ? ?PAST SURGICAL HISTORY: ?Past Surgical History:  ?Procedure Laterality Date  ? APPENDECTOMY    ? CHOLECYSTECTOMY N/A 12/16/2014  ? Procedure: LAPAROSCOPIC CHOLECYSTECTOMY WITH INTRAOPERATIVE CHOLANGIOGRAM;  Surgeon: [name], MD;  Location: [facility];  Service: General;  Laterality: N/A;  ? COLONOSCOPY  10/27/2011  ? single polyp. Repeat 5 years.  ? POLYPECTOMY    ? PROSTATE BIOPSY  10/2014  ? will have another in 3 months  ? SEPTOPLASTY  2014  ? TONSILLECTOMY  2014  ? UVULOPALATOPHARYNGOPLASTY  2014  ? ? ?FAMILY HISTORY:  ?Family History  ?Problem Relation Age of Onset  ? Stroke  Mother   ? Asthma Sister   ? Colon cancer Neg Hx   ? Esophageal cancer Neg Hx   ? Stomach cancer Neg Hx   ? Prostate cancer Neg Hx   ? Diabetes Neg Hx   ? CAD Neg Hx   ? Migraines Neg Hx   ? Headache Neg Hx   ? ? ?SOCIAL HISTORY:  ?Social History  ? ?Socioeconomic History  ? Marital status: Divorced  ?  Spouse name: Not on file  ? Number of children: 1  ? Years of education: Not on file  ? Highest education level: Doctorate  ?Occupational History  ? Occupation: TEACHER  ?  Employer: [employer]  ?  Comment: [employer]  ?Tobacco Use  ? Smoking status: Never  ? Smokeless tobacco: Never  ?Vaping Use  ? Vaping Use: Never used  ?Substance and Sexual Activity  ? Alcohol use: Never  ?  Alcohol/week: 0.0 standard drinks  ? Drug use: Never  ? Sexual activity: Yes  ?  Comment: [employer]  ?Other Topics Concern  ? Not on file  ?Social History Narrative  ? Original from [place]; moved to [place] 1995.  ? Marital status: same sexual partner 4 X years.  ? Children:  1 child/son (18) at [employer] son's mom is a Pharmacist, hospital in [place]  ? Lives: with partner/boyfriend; sees son daily; son lives with mother  ? 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    ?    Tobacco: never  ?    Alcohol: none  ?    Drugs: none  ?     Exercise: daily in 2018; 10,000 steps daily; lots of walking on campus at work  ?    ADLs: independent with ADLs.    ?    Sexual activity: dating males only in 2018; bisexual.  Previously married to male.   ?   ? --------  ? 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  ?      ? ?Social Determinants of Health  ? ?Financial Resource Strain: Not on file  ?Food Insecurity: Not on file  ?Transportation Needs: Not on file  ?Physical Activity: Not on file   ?Stress: Not on file  ?Social Connections: Not on file  ?Intimate Partner Violence: Not on file  ? ? ?ALLERGIES: Patient has no known allergies. ? ?MEDICATIONS:  ?Current Outpatient Medications  ?Medication Sig Dispense Refill  ? celecoxib (CELEBREX) 200 MG capsule TAKE 1 CAPSULE(200 MG) BY MOUTH DAILY WITH FOOD    ? hydrochlorothiazide (HYDRODIURIL) 12.5 MG tablet Take by mouth.    ? Tapinarof (  VTAMA) 1 % CREA 1 application    ? albuterol (VENTOLIN HFA) 108 (90 Base) MCG/ACT inhaler INHALE 2 PUFFS INTO THE LUNGS EVERY 4 HOURS AS NEEDED FOR WHEEZING OR SHORTNESS OF BREATH(COUGHING FITS) 8.5 g 1  ? amLODipine (NORVASC) 5 MG tablet TAKE 1 TABLET(5 MG) BY MOUTH DAILY 90 tablet 3  ? aspirin EC 325 MG tablet Take 325 mg by mouth as needed.    ? azelastine (ASTELIN) 0.1 % nasal spray Place 1 spray into both nostrils 2 (two) times daily. Use in each nostril as directed 30 mL 12  ? Cetirizine HCl 10 MG CAPS Take 1 capsule (10 mg total) by mouth daily. 90 capsule 3  ? chlorpheniramine-HYDROcodone (TUSSIONEX PENNKINETIC ER) 10-8 MG/5ML SUER Take 5 mLs by mouth every 12 (twelve) hours as needed for cough. 140 mL 0  ? ciprofloxacin (CIPRO) 500 MG tablet Take 1 tablet (500 mg total) by mouth every 12 (twelve) hours. 10 tablet 0  ? clobetasol (OLUX) 0.05 % topical foam APPLY EXTERNALLY TO THE AFFECTED AREA TWICE DAILY 50 g 3  ? clobetasol ointment (TEMOVATE) 0.05 % APPLY EXTERNALLY TO THE AFFECTED AREA TWICE DAILY 30 g 3  ? EPINEPHrine (AUVI-Q) 0.3 mg/0.3 mL IJ SOAJ injection Inject 0.3 mg into the muscle as needed for anaphylaxis. 1 each 2  ? fluocinolone (SYNALAR) 0.025 % ointment 1 application    ? fluocinolone (VANOS) 0.01 % cream Apply topically daily. 30 g 1  ? fluocinonide cream (LIDEX) 0.05 % APPLY SPARINGLY EXTERNALLY TO THE AFFECTED AREA TWICE DAILY 30 g 1  ? fluticasone (FLONASE) 50 MCG/ACT nasal spray Place 2 sprays into both nostrils daily. 16 g 2  ? fluticasone (FLOVENT HFA) 110 MCG/ACT inhaler Inhale 1 puff into  the lungs in the morning and at bedtime. 1 each 12  ? Fluticasone Furoate (ARNUITY ELLIPTA) 100 MCG/ACT AEPB Inhale 1 puff into the lungs daily. Rinse mouth after each use. 30 each 5  ? gabapentin (NEURONTIN)

## 2022-04-19 ENCOUNTER — Ambulatory Visit
Admission: RE | Admit: 2022-04-19 | Discharge: 2022-04-19 | Disposition: A | Discharge status: Home / Self Care | Payer: BC Managed Care – PPO | Source: Ambulatory Visit | Attending: Radiation Oncology | Admitting: Radiation Oncology

## 2022-04-19 ENCOUNTER — Other Ambulatory Visit: Payer: Self-pay

## 2022-04-19 VITALS — BP 122/91 | HR 62 | Temp 97.1°F | Resp 18 | Ht 66.0 in | Wt 190.1 lb

## 2022-04-19 DIAGNOSIS — G4730 Sleep apnea, unspecified: Secondary | ICD-10-CM | POA: Diagnosis not present

## 2022-04-19 DIAGNOSIS — C61 Malignant neoplasm of prostate: Secondary | ICD-10-CM | POA: Insufficient documentation

## 2022-04-19 DIAGNOSIS — L719 Rosacea, unspecified: Secondary | ICD-10-CM | POA: Insufficient documentation

## 2022-04-19 DIAGNOSIS — Z800 Family history of malignant neoplasm of digestive organs: Secondary | ICD-10-CM | POA: Insufficient documentation

## 2022-04-19 DIAGNOSIS — Z86010 Personal history of colonic polyps: Secondary | ICD-10-CM | POA: Diagnosis not present

## 2022-04-19 DIAGNOSIS — Z79899 Other long term (current) drug therapy: Secondary | ICD-10-CM | POA: Diagnosis not present

## 2022-04-19 DIAGNOSIS — Z791 Long term (current) use of non-steroidal anti-inflammatories (NSAID): Secondary | ICD-10-CM | POA: Diagnosis not present

## 2022-04-19 DIAGNOSIS — I10 Essential (primary) hypertension: Secondary | ICD-10-CM | POA: Diagnosis not present

## 2022-04-19 DIAGNOSIS — E785 Hyperlipidemia, unspecified: Secondary | ICD-10-CM | POA: Diagnosis not present

## 2022-04-19 DIAGNOSIS — Z87442 Personal history of urinary calculi: Secondary | ICD-10-CM | POA: Insufficient documentation

## 2022-04-19 DIAGNOSIS — Z7951 Long term (current) use of inhaled steroids: Secondary | ICD-10-CM | POA: Diagnosis not present

## 2022-04-19 DIAGNOSIS — K219 Gastro-esophageal reflux disease without esophagitis: Secondary | ICD-10-CM | POA: Insufficient documentation

## 2022-04-19 NOTE — Progress Notes (Signed)
Introduced myself to the patient as the prostate nurse navigator.  No barriers to care identified at this time.  He is here to discuss his radiation treatment options, and has elected to proceed with active surveillance at this time.  I gave him my business card and asked him to call me with questions or concerns that may arise.  Verbalized understanding.  ?

## 2022-04-25 ENCOUNTER — Ambulatory Visit (INDEPENDENT_AMBULATORY_CARE_PROVIDER_SITE_OTHER): Payer: BC Managed Care – PPO | Admitting: Emergency Medicine

## 2022-04-25 ENCOUNTER — Encounter: Payer: Self-pay | Admitting: Emergency Medicine

## 2022-04-25 VITALS — BP 136/84 | HR 66 | Temp 98.2°F | Ht 66.0 in | Wt 190.2 lb

## 2022-04-25 DIAGNOSIS — M549 Dorsalgia, unspecified: Secondary | ICD-10-CM | POA: Diagnosis not present

## 2022-04-25 DIAGNOSIS — Z87442 Personal history of urinary calculi: Secondary | ICD-10-CM | POA: Insufficient documentation

## 2022-04-25 DIAGNOSIS — E785 Hyperlipidemia, unspecified: Secondary | ICD-10-CM | POA: Diagnosis not present

## 2022-04-25 DIAGNOSIS — R7303 Prediabetes: Secondary | ICD-10-CM

## 2022-04-25 DIAGNOSIS — I10 Essential (primary) hypertension: Secondary | ICD-10-CM

## 2022-04-25 DIAGNOSIS — Z8546 Personal history of malignant neoplasm of prostate: Secondary | ICD-10-CM

## 2022-04-25 NOTE — Progress Notes (Signed)
[place] ?61 y.o. ? ? ?Chief Complaint  ?Patient presents with  ? Follow-up  ? left back pain  ?  Pt states he is having progressively worse back pain.   ? Cough  ?  Discomfort in chest, cough   ? knot on rt elbow  ? ? ?HISTORY OF PRESENT ILLNESS: ?This is a 61 y.o. male complaining of back pain for several weeks. ?Recently diagnosed with prostate cancer on active surveillance at the cancer center ?Has history of left-sided kidney stone ?Gets occasional left flank pain. ?Also gets occasional right-sided lumbar pain. ?Noticed small knot on the right elbow ?Occasional dry cough ?No other complaints or medical concerns today. ? ?Cough ?Pertinent negatives include no chest pain, chills, fever, headaches or sore throat.  ? ? ?Prior to Admission medications   ?Medication Sig Start Date End Date Taking? Authorizing Provider  ?albuterol (VENTOLIN HFA) 108 (90 Base) MCG/ACT inhaler INHALE 2 PUFFS INTO THE LUNGS EVERY 4 HOURS AS NEEDED FOR WHEEZING OR SHORTNESS OF BREATH(COUGHING FITS) 12/21/21   Kozlow, Donnamarie Poag, MD  ?amLODipine (NORVASC) 5 MG tablet TAKE 1 TABLET(5 MG) BY MOUTH DAILY 12/21/21   Horald Pollen, MD  ?aspirin EC 325 MG tablet Take 325 mg by mouth as needed.    [provider]  ?azelastine (ASTELIN) 0.1 % nasal spray Place 1 spray into both nostrils 2 (two) times daily. Use in each nostril as directed 03/28/22   Roney [name], MD  ?celecoxib (CELEBREX) 200 MG capsule TAKE 1 CAPSULE(200 MG) BY MOUTH DAILY WITH FOOD 02/14/22   [provider]  ?Cetirizine HCl 10 MG CAPS Take 1 capsule (10 mg total) by mouth daily. 03/28/22   Roney [name], MD  ?chlorpheniramine-HYDROcodone [facility] PENNKINETIC ER) 10-8 MG/5ML SUER Take 5 mLs by mouth every 12 (twelve) hours as needed for cough. 11/02/21   Kozlow, Donnamarie Poag, MD  ?ciprofloxacin (CIPRO) 500 MG tablet Take 1 tablet (500 mg total) by mouth every 12 (twelve) hours. 10/31/21   Hazel Sams, PA-C  ?clobetasol (OLUX) 0.05 % topical foam APPLY  EXTERNALLY TO THE AFFECTED AREA TWICE DAILY 03/31/20   Jacelyn Pi, Lilia Argue, MD  ?clobetasol ointment (TEMOVATE) 0.05 % APPLY EXTERNALLY TO THE AFFECTED AREA TWICE DAILY 04/17/20   Jacelyn Pi, Lilia Argue, MD  ?EPINEPHrine (AUVI-Q) 0.3 mg/0.3 mL IJ SOAJ injection Inject 0.3 mg into the muscle as needed for anaphylaxis. 12/01/20   Garnet Sierras, DO  ?fluocinolone (SYNALAR) 0.025 % ointment 1 application    [provider]  ?fluocinolone (VANOS) 0.01 % cream Apply topically daily. 12/06/19   Jacelyn Pi, Lilia Argue, MD  ?fluocinonide cream (LIDEX) 0.05 % APPLY SPARINGLY EXTERNALLY TO THE AFFECTED AREA TWICE DAILY 02/02/20   Jacelyn Pi, Lilia Argue, MD  ?fluticasone [facility]) 50 MCG/ACT nasal spray Place 2 sprays into both nostrils daily. 03/28/22   Roney [name], MD  ?fluticasone (FLOVENT HFA) 110 MCG/ACT inhaler Inhale 1 puff into the lungs in the morning and at bedtime. 03/28/22   Roney [name], MD  ?Fluticasone Furoate (ARNUITY ELLIPTA) 100 MCG/ACT AEPB Inhale 1 puff into the lungs daily. Rinse mouth after each use. 04/05/21   Garnet Sierras, DO  ?gabapentin (NEURONTIN) 100 MG capsule Take 100 mg by mouth 3 (three) times daily.    [provider]  ?hydrochlorothiazide (HYDRODIURIL) 12.5 MG tablet Take by mouth. 03/02/21   [provider]  ?hydrocortisone (ANUSOL-HC) 2.5 % rectal cream APPLY RECTALLY TO THE AFFECTED AREA TWICE DAILY 03/20/22   [place], Falkner,  MD  ?hyoscyamine (LEVSIN SL) 0.125 MG SL tablet Place 1 tablet (0.125 mg total) under the tongue every 8 (eight) hours as needed. 08/20/19   Willia Craze, NP  ?ibuprofen (ADVIL) 600 MG tablet Take 1 tablet (600 mg total) by mouth every 6 (six) hours as needed. 06/03/20   Melynda Ripple, MD  ?ketoconazole (NIZORAL) 2 % shampoo APPLY EXTERNALLY 2 TIMES A WEEK. 11/25/17   [name], MD  ?ketoconazole (NIZORAL) 2 % shampoo     [provider]  ?lisinopril (ZESTRIL) 40 MG tablet Take 1 tablet (40 mg total) by mouth daily. 07/07/21    Horald Pollen, MD  ?loratadine (CLARITIN) 10 MG tablet Take 1 tablet (10 mg total) by mouth 2 (two) times daily. 11/02/21   Kozlow, Donnamarie Poag, MD  ?mometasone (NASONEX) 50 MCG/ACT nasal spray Place 1-2 sprays into the nose daily as needed (nasal symptoms). 11/02/21   Kozlow, Donnamarie Poag, MD  ?montelukast (SINGULAIR) 10 MG tablet TAKE 1 TABLET(10 MG) BY MOUTH AT BEDTIME 08/28/20   Lauraine Rinne, NP  ?neomycin-polymyxin b-dexamethasone (MAXITROL) 3.5-10000-0.1 OINT  01/21/22   [provider]  ?olopatadine (PATANOL) 0.1 % ophthalmic solution Place 1 drop into both eyes 2 (two) times daily. 03/28/22   Roney [name], MD  ?rosuvastatin (CRESTOR) 10 MG tablet TAKE 1 TABLET(10 MG) BY MOUTH DAILY 02/14/22   Horald Pollen, MD  ?sildenafil (VIAGRA) 100 MG tablet Take 0.5-1 tablets (50-100 mg total) by mouth daily as needed for erectile dysfunction. 07/09/21   Horald Pollen, MD  ?Samella Parr [facility]) 1 % CREA 1 application 61/44/31   [provider]  ? ? ?No Known Allergies ? ?Patient Active Problem List  ? Diagnosis Date Noted  ? Rosacea 04/19/2022  ? Malignant neoplasm of prostate ([place]) 04/19/2022  ? Enlarged prostate 01/12/2022  ? Neck pain 08/10/2021  ? Jammed interphalangeal joint of finger of left hand 03/10/2021  ? Closed nondisplaced fracture of fifth metacarpal bone of right hand 03/10/2021  ? Closed nondisplaced fracture of shaft of fourth metacarpal bone of right hand 03/10/2021  ? Prediabetes 04/02/2019  ? Dyslipidemia 12/23/2014  ? Psoriasis 06/27/2014  ? Multiple lung nodules 12/09/2011  ? PULMONARY HYPERTENSION 03/04/2010  ? Essential hypertension 02/10/2010  ? OBSTRUCTIVE SLEEP APNEA 02/04/2010  ? ? ?Past Medical History:  ?Diagnosis Date  ? Allergy   ? Flonase, Patanol, Zyrtec  ? Arthritis   ? psoriasis  ? Asthma   ? seasonal, with allergies  ? Borderline diabetes   ? Cluster headache   ? Colon polyp 10/27/2011  ? Depression   ? denies 11/11/16; denies 11/21/18 "been years"  ? Eczema    ? GERD (gastroesophageal reflux disease)   ? History of kidney stones   ? x1  ? Hyperlipidemia   ? Hypertension   ? Low back pain   ? "in kidney area-was told that it was related to gallstones"  ? Prediabetes   ? Psoriasis   ? [name]  ? Severe sleep apnea   ? h/o; had procedure and weight control, does not have to wear CPAP  ? Tuberculosis   ? granuloma on lungs, 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  ? ? ?Past Surgical History:  ?Procedure Laterality Date  ? APPENDECTOMY    ? CHOLECYSTECTOMY N/A 12/16/2014  ? Procedure: LAPAROSCOPIC CHOLECYSTECTOMY WITH INTRAOPERATIVE CHOLANGIOGRAM;  Surgeon: [name], MD;  Location: [facility];  Service: General;  Laterality: N/A;  ? COLONOSCOPY  10/27/2011  ? single polyp. Repeat 5 years.  ?  POLYPECTOMY    ? PROSTATE BIOPSY  10/2014  ? will have another in 3 months  ? SEPTOPLASTY  2014  ? TONSILLECTOMY  2014  ? UVULOPALATOPHARYNGOPLASTY  2014  ? ? ?Social History  ? ?Socioeconomic History  ? Marital status: Divorced  ?  Spouse name: Not on file  ? Number of children: 1  ? Years of education: Not on file  ? Highest education level: Doctorate  ?Occupational History  ? Occupation: TEACHER  ?  Employer: [employer]  ?  Comment: [employer]  ?Tobacco Use  ? Smoking status: Never  ? Smokeless tobacco: Never  ?Vaping Use  ? Vaping Use: Never used  ?Substance and Sexual Activity  ? Alcohol use: Never  ?  Alcohol/week: 0.0 standard drinks  ? Drug use: Never  ? Sexual activity: Yes  ?  Comment: [employer]  ?Other Topics Concern  ? Not on file  ?Social History Narrative  ? Original from [place]; moved to [place] 1995.  ? Marital status: same sexual partner 4 X years.  ? Children:  1 child/son (18) at [employer] son's mom is a Pharmacist, hospital in [place]  ? Lives: with partner/boyfriend; sees son daily; son lives with mother  ? 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    ?    Tobacco: never  ?    Alcohol: none  ?    Drugs: none  ?     Exercise: daily in 2018; 10,000 steps daily; lots of walking on campus at work  ?    ADL

## 2022-04-25 NOTE — Assessment & Plan Note (Signed)
Well-controlled hypertension.  Continue lisinopril 40 mg and hydrochlorothiazide 12.5 mg daily. ?BP Readings from Last 3 Encounters:  ?04/25/22 136/84  ?04/19/22 (!) 122/91  ?03/28/22 110/70  ? ? ?

## 2022-04-25 NOTE — Assessment & Plan Note (Signed)
Diet and nutrition discussed.  Advised to decrease amount of daily carbohydrate intake. 

## 2022-04-25 NOTE — Assessment & Plan Note (Signed)
Stable.  Under active surveillance by oncologist at the cancer center. ?

## 2022-04-25 NOTE — Assessment & Plan Note (Signed)
We will schedule renal ultrasound and check status of kidney stone ?

## 2022-04-25 NOTE — Patient Instructions (Signed)
Mantenimiento de la salud en los hombres Health Maintenance, Male Adoptar un estilo de vida saludable y recibir atencin preventiva son importantes para promover la salud y el bienestar. Consulte al mdico sobre: El esquema adecuado para hacerse pruebas y exmenes peridicos. Cosas que puede hacer por su cuenta para prevenir enfermedades y mantenerse sano. Qu debo saber sobre la dieta, el peso y el ejercicio? Consuma una dieta saludable  Consuma una dieta que incluya muchas verduras, frutas, productos lcteos con bajo contenido de grasa y protenas magras. No consuma muchos alimentos ricos en grasas slidas, azcares agregados o sodio. Mantenga un peso saludable El ndice de masa muscular (IMC) es una medida que puede utilizarse para identificar posibles problemas de peso. Proporciona una estimacin de la grasa corporal basndose en el peso y la altura. Su mdico puede ayudarle a determinar su IMC y a lograr o mantener un peso saludable. Haga ejercicio con regularidad Haga ejercicio con regularidad. Esta es una de las prcticas ms importantes que puede hacer por su salud. La mayora de los adultos deben seguir estas pautas: Realizar, al menos, 150 minutos de actividad fsica por semana. El ejercicio debe aumentar la frecuencia cardaca y hacerlo transpirar (ejercicio de intensidad moderada). Hacer ejercicios de fortalecimiento por lo menos dos veces por semana. Agregue esto a su plan de ejercicio de intensidad moderada. Pase menos tiempo sentado. Incluso la actividad fsica ligera puede ser beneficiosa. Controle sus niveles de colesterol y lpidos en la sangre Comience a realizarse anlisis de lpidos y colesterol en la sangre a los 20 aos y luego reptalos cada 5 aos. Es posible que necesite controlar los niveles de colesterol con mayor frecuencia si: Sus niveles de lpidos y colesterol son altos. Es mayor de 40 aos. Presenta un alto riesgo de padecer enfermedades cardacas. Qu debo  saber sobre las pruebas de deteccin del cncer? Muchos tipos de cncer pueden detectarse de manera temprana y, a menudo, pueden prevenirse. Segn su historia clnica y sus antecedentes familiares, es posible que deba realizarse pruebas de deteccin del cncer en diferentes edades. Esto puede incluir pruebas de deteccin de lo siguiente: Cncer colorrectal. Cncer de prstata. Cncer de piel. Cncer de pulmn. Qu debo saber sobre la enfermedad cardaca, la diabetes y la hipertensin arterial? Presin arterial y enfermedad cardaca La hipertensin arterial causa enfermedades cardacas y aumenta el riesgo de accidente cerebrovascular. Es ms probable que esto se manifieste en las personas que tienen lecturas de presin arterial alta o tienen sobrepeso. Hable con el mdico sobre sus valores de presin arterial deseados. Hgase controlar la presin arterial: Cada 3 a 5 aos si tiene entre 18 y 39 aos. Todos los aos si es mayor de 40 aos. Si tiene entre 65 y 75 aos y es fumador o sola fumar, pregntele al mdico si debe realizarse una prueba de deteccin de aneurisma artico abdominal (AAA) por nica vez. Diabetes Realcese exmenes de deteccin de la diabetes con regularidad. Este anlisis revisa el nivel de azcar en la sangre en ayunas. Hgase las pruebas de deteccin: Cada tres aos despus de los 45 aos de edad si tiene un peso normal y un bajo riesgo de padecer diabetes. Con ms frecuencia y a partir de una edad inferior si tiene sobrepeso o un alto riesgo de padecer diabetes. Qu debo saber sobre la prevencin de infecciones? Hepatitis B Si tiene un riesgo ms alto de contraer hepatitis B, debe someterse a un examen de deteccin de este virus. Hable con el mdico para averiguar si tiene riesgo de   contraer la infeccin por hepatitis B. Hepatitis C Se recomienda un anlisis de sangre para: Todos los que nacieron entre 1945 y 1965. Todas las personas que tengan un riesgo de haber  contrado hepatitis C. Enfermedades de transmisin sexual (ETS) Debe realizarse pruebas de deteccin de ITS todos los aos, incluidas la gonorrea y la clamidia, si: Es sexualmente activo y es menor de 24 aos. Es mayor de 24 aos, y el mdico le informa que corre riesgo de tener este tipo de infecciones. La actividad sexual ha cambiado desde que le hicieron la ltima prueba de deteccin y tiene un riesgo mayor de tener clamidia o gonorrea. Pregntele al mdico si usted tiene riesgo. Pregntele al mdico si usted tiene un alto riesgo de contraer VIH. El mdico tambin puede recomendarle un medicamento recetado para ayudar a evitar la infeccin por el VIH. Si elige tomar medicamentos para prevenir el VIH, primero debe hacerse los anlisis de deteccin del VIH. Luego debe hacerse anlisis cada 3 meses mientras est tomando los medicamentos. Siga estas indicaciones en su casa: Consumo de alcohol No beba alcohol si el mdico se lo prohbe. Si bebe alcohol: Limite la cantidad que consume de 0 a 2 bebidas por da. Sepa cunta cantidad de alcohol hay en las bebidas que toma. En los [place], una medida equivale a una botella de cerveza de 12 oz (355 ml), un vaso de vino de 5 oz (148 ml) o un vaso de una bebida alcohlica de alta graduacin de 1 oz (44 ml). Estilo de vida No consuma ningn producto que contenga nicotina o tabaco. Estos productos incluyen cigarrillos, tabaco para mascar y aparatos de vapeo, como los cigarrillos electrnicos. Si necesita ayuda para dejar de consumir estos productos, consulte al mdico. No consuma drogas. No comparta agujas. Solicite ayuda a su mdico si necesita apoyo o informacin para abandonar las drogas. Indicaciones generales Realcese los estudios de rutina de la salud, dentales y de la vista. Mantngase al da con las vacunas. Infrmele a su mdico si: Se siente deprimido con frecuencia. Alguna vez ha sido vctima de maltrato o no se siente seguro en su  casa. Resumen Adoptar un estilo de vida saludable y recibir atencin preventiva son importantes para promover la salud y el bienestar. Siga las instrucciones del mdico acerca de una dieta saludable, el ejercicio y la realizacin de pruebas o exmenes para detectar enfermedades. Siga las instrucciones del mdico con respecto al control del colesterol y la presin arterial. Esta informacin no tiene como fin reemplazar el consejo del mdico. Asegrese de hacerle al mdico cualquier pregunta que tenga. Document Revised: 05/19/2021 Document Reviewed: 05/19/2021 Elsevier Patient Education  2023 Elsevier Inc.  

## 2022-04-25 NOTE — Assessment & Plan Note (Signed)
Stable.  Diet and nutrition discussed.  Continue rosuvastatin 10 mg daily.  

## 2022-04-25 NOTE — Assessment & Plan Note (Signed)
Mechanical in nature.  Advised to take Tylenol and or Advil as needed. ?No red flag signs or symptoms. ?

## 2022-04-27 ENCOUNTER — Ambulatory Visit: Payer: BC Managed Care – PPO | Admitting: Allergy and Immunology

## 2022-04-27 ENCOUNTER — Encounter: Payer: Self-pay | Admitting: Allergy and Immunology

## 2022-04-27 VITALS — BP 122/80 | HR 64 | Resp 16

## 2022-04-27 DIAGNOSIS — J302 Other seasonal allergic rhinitis: Secondary | ICD-10-CM

## 2022-04-27 DIAGNOSIS — H1013 Acute atopic conjunctivitis, bilateral: Secondary | ICD-10-CM | POA: Diagnosis not present

## 2022-04-27 DIAGNOSIS — H1045 Other chronic allergic conjunctivitis: Secondary | ICD-10-CM

## 2022-04-27 DIAGNOSIS — J4541 Moderate persistent asthma with (acute) exacerbation: Secondary | ICD-10-CM

## 2022-04-27 DIAGNOSIS — R0609 Other forms of dyspnea: Secondary | ICD-10-CM

## 2022-04-27 MED ORDER — BREZTRI AEROSPHERE 160-9-4.8 MCG/ACT IN AERO: INHALATION_SPRAY | RESPIRATORY_TRACT | 5 refills | Status: DC

## 2022-04-27 MED ORDER — METHYLPREDNISOLONE ACETATE 80 MG/ML IJ SUSP
80.0000 mg | Freq: Once | INTRAMUSCULAR | Status: AC
  Administered 2022-04-27: 80 mg via INTRAMUSCULAR

## 2022-04-27 NOTE — Progress Notes (Signed)
? ?[place] ? ? ?Follow-up Note ? ?Referring Provider: Horald Pollen, * ?Primary Provider: Horald Pollen, MD ?Date of Office Visit: 04/27/2022 ? ?Subjective:  ? ?Rickey Cruz (DOB: August 10, 1961) is a 61 y.o. male who returns to the [place] on 04/27/2022 in re-evaluation of the following: ? ?HPI: Luman returns to this clinic in reevaluation of asthma and allergic rhinoconjunctivitis.  I have not seen him in this clinic since 02 November 2021 at which point in time he had COVID which was successfully treated with Paxlovid. ? ?He visited with [facility] on 28 March 2022 for an acute exacerbation of his multiorgan atopic disease for which she established a plan of anti-inflammatory medications which has not worked.  He still continues to have lots of coughing and has developed very significant shortness of breath.  This appears to be an exertional shortness of breath.  The other day he went up a flight of stairs and was almost breathless at the top of the stairs.  He has been having coughing and some clear phlegm production and occasionally his cough is associated with some back pain.  He feels like there is something in his throat intermittently and he is having watery eyes and some sneezing.  He does not have any anosmia or ugly nasal discharge or ugly sputum production. ? ?He discontinued his immunotherapy in December 2022 because of the logistical issue.  He lives in [place] and works in [place]. ? 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  It has been advised under the counsel of his urologist that he undergo conservative observation rather than radiation or prostatectomy to address this issue. ? ?Allergies as of 04/27/2022   ?No Known Allergies ?  ? ?  ?Medication List  ? ? ?albuterol 108 (90 Base) MCG/ACT inhaler ?Commonly known as: VENTOLIN HFA ?INHALE 2 PUFFS INTO THE LUNGS EVERY  4 HOURS AS NEEDED FOR WHEEZING OR SHORTNESS OF BREATH(COUGHING FITS) ?  ?amLODipine 5 MG tablet ?Commonly known as: NORVASC ?TAKE 1 TABLET(5 MG) BY MOUTH DAILY ?  ?celecoxib 200 MG capsule ?Commonly known as: CELEBREX ?TAKE 1 CAPSULE(200 MG) BY MOUTH DAILY WITH FOOD ?  ?clobetasol 0.05 % topical foam ?Commonly known as: OLUX ?APPLY EXTERNALLY TO THE AFFECTED AREA TWICE DAILY ?  ?clobetasol ointment 0.05 % ?Commonly known as: TEMOVATE ?APPLY EXTERNALLY TO THE AFFECTED AREA TWICE DAILY ?  ?fluocinolone 0.025 % ointment ?Commonly known as: SYNALAR ?1 application ?  ?fluocinolone 0.01 % cream ?Commonly known as: VANOS ?Apply topically daily. ?  ?fluocinonide cream 0.05 % ?Commonly known as: LIDEX ?APPLY SPARINGLY EXTERNALLY TO THE AFFECTED AREA TWICE DAILY ?  ?fluticasone 110 MCG/ACT inhaler ?Commonly known as: Flovent HFA ?Inhale 1 puff into the lungs in the morning and at bedtime. ?  ?fluticasone 50 MCG/ACT nasal spray ?Commonly known as: FLONASE ?Place 2 sprays into both nostrils daily. ?  ?hydrochlorothiazide 12.5 MG tablet ?Commonly known as: HYDRODIURIL ?Take by mouth. ?  ?hydrocortisone 2.5 % rectal cream ?Commonly known as: ANUSOL-HC ?APPLY RECTALLY TO THE AFFECTED AREA TWICE DAILY ?  ?ibuprofen 600 MG tablet ?Commonly known as: ADVIL ?Take 1 tablet (600 mg total) by mouth every 6 (six) hours as needed. ?  ?lisinopril 40 MG tablet ?Commonly known as: ZESTRIL ?Take 1 tablet (40 mg total) by mouth daily. ?  ?loratadine 10 MG tablet ?Commonly known as: Claritin ?Take 1 tablet (10 mg total) by mouth 2 (  two) times daily. ?  ?montelukast 10 MG tablet ?Commonly known as: SINGULAIR ?TAKE 1 TABLET(10 MG) BY MOUTH AT BEDTIME ?  ?rosuvastatin 10 MG tablet ?Commonly known as: CRESTOR ?TAKE 1 TABLET(10 MG) BY MOUTH DAILY ?  ? ?Past Medical History:  ?Diagnosis Date  ? Allergy   ? Flonase, Patanol, Zyrtec  ? Arthritis   ? psoriasis  ? Asthma   ? seasonal, with allergies  ? Borderline diabetes   ? Cluster headache   ? Colon  polyp 10/27/2011  ? Depression   ? denies 11/11/16; denies 11/21/18 "been years"  ? Eczema   ? GERD (gastroesophageal reflux disease)   ? History of kidney stones   ? x1  ? Hyperlipidemia   ? Hypertension   ? Low back pain   ? "in kidney area-was told that it was related to gallstones"  ? Prediabetes   ? Prostate cancer ([place])   ? Psoriasis   ? [name]  ? Severe sleep apnea   ? h/o; had procedure and weight control, does not have to wear CPAP  ? Tuberculosis   ? granuloma on lungs, 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  ? ? ?Past Surgical History:  ?Procedure Laterality Date  ? APPENDECTOMY    ? CHOLECYSTECTOMY N/A 12/16/2014  ? Procedure: LAPAROSCOPIC CHOLECYSTECTOMY WITH INTRAOPERATIVE CHOLANGIOGRAM;  Surgeon: [name], MD;  Location: [facility];  Service: General;  Laterality: N/A;  ? COLONOSCOPY  10/27/2011  ? single polyp. Repeat 5 years.  ? POLYPECTOMY    ? PROSTATE BIOPSY  10/2014  ? will have another in 3 months  ? SEPTOPLASTY  2014  ? TONSILLECTOMY  2014  ? UVULOPALATOPHARYNGOPLASTY  2014  ? ? ?Review of systems negative except as noted in HPI / PMHx or noted below: ? ?Review of Systems  ?Constitutional: Negative.   ?HENT: Negative.    ?Eyes: Negative.   ?Respiratory: Negative.    ?Cardiovascular: Negative.   ?Gastrointestinal: Negative.   ?Genitourinary: Negative.   ?Musculoskeletal: Negative.   ?Skin: Negative.   ?Neurological: Negative.   ?Endo/Heme/Allergies: Negative.   ?Psychiatric/Behavioral: Negative.    ? ? ?Objective:  ? ?Vitals:  ? 04/27/22 1543  ?BP: 122/80  ?Pulse: 64  ?Resp: 16  ?SpO2: 97%  ? ?   ?   ? ?Physical Exam ?Constitutional:   ?   Appearance: He is not diaphoretic.  ?HENT:  ?   Head: Normocephalic.  ?   Right Ear: Tympanic membrane, ear canal and external ear normal.  ?   Left Ear: Tympanic membrane, ear canal and external ear normal.  ?   Nose: Mucosal edema present. No rhinorrhea.  ?   Mouth/Throat:  ?   Pharynx: Uvula midline. No oropharyngeal exudate.  ?Eyes:  ?    Conjunctiva/sclera:  ?   Right eye: Right conjunctiva is injected.  ?   Left eye: Left conjunctiva is injected.  ?Neck:  ?   Thyroid: No thyromegaly.  ?   Trachea: Trachea normal. No tracheal tenderness or tracheal deviation.  ?Cardiovascular:  ?   Rate and Rhythm: Normal rate and regular rhythm.  ?   Heart sounds: Normal heart sounds, S1 normal and S2 normal. No murmur heard. ?Pulmonary:  ?   Effort: No respiratory distress.  ?   Breath sounds: Normal breath sounds. No stridor. No wheezing or rales.  ?Lymphadenopathy:  ?   Head:  ?   Right side of head: No tonsillar adenopathy.  ?   Left side of head: No tonsillar adenopathy.  ?  Cervical: No cervical adenopathy.  ?Skin: ?   Findings: No erythema or rash.  ?   Nails: There is no clubbing.  ?Neurological:  ?   Mental Status: He is alert.  ? ? ?Diagnostics:  ?  ?Spirometry was performed and demonstrated an FEV1 of 2.46 at 85 % of predicted. ? ?Assessment and Plan:  ? ?1. Asthma, not well controlled, moderate persistent, with acute exacerbation   ?2. Seasonal and perennial allergic rhinoconjunctivitis   ?3. Dyspnea on exertion   ? ?  ?1.  Continue Flonase 2 sprays each nostril once a day ? ?2.  Continue Singulair 10 mg 1 tablet once a day ? ?3.  Start Breztri - 2 inhalations 2 times per day w/ spacer (empty lungs) ? ?4.  Depomedrol 80 IM delivered in clinic today ? ?5.  If needed: ? ?A. Albuterol HFA 2 inhalations every 4-6 hours  ?B. Loratadine 10 mg - 1 tablet 1-2 times per day ?C. Pataday - 1 drop each eye 1 time per day ? ?6. Obtain a chest X-Ray ? ?7. Further treatment??? ? ?8. Return to clinic in 1 month or earlier if problem ? ?Zeke most likely has significant inflammation of his airway from his atopic respiratory disease but he has very bad dyspnea on exertion and gets out of breath walking up stairs.  I am going to obtain a chest x-ray to make sure were not dealing with anything other than just atopic inflammation.  I have given him a systemic steroid and  he will start a triple inhaler while he continues on other anti-inflammatory agents for his atopic disease.  I would like to see him back in this clinic in 1 month or earlier if there is a problem. ? ?Allena Katz, MD ?[facility]

## 2022-04-27 NOTE — Patient Instructions (Addendum)
?  1.  Continue Flonase 2 sprays each nostril once a day ? ?2.  Continue Singulair 10 mg 1 tablet once a day ? ?3.  Start Breztri - 2 inhalations 2 times per day w/ spacer (empty lungs) ? ?4.  Depomedrol 80 IM delivered in clinic today ? ?5.  If needed: ? ?A. Albuterol HFA 2 inhalations every 4-6 hours  ?B. Loratadine 10 mg - 1 tablet 1-2 times per day ?C. Pataday - 1 drop each eye 1 time per day ? ?6. Obtain a chest X-Ray ? ?7. Further treatment??? ? ?8. Return to clinic in 1 month or earlier if problem ?

## 2022-04-28 ENCOUNTER — Encounter: Payer: Self-pay | Admitting: Allergy and Immunology

## 2022-04-28 ENCOUNTER — Encounter: Payer: Self-pay | Admitting: Nurse Practitioner

## 2022-04-28 ENCOUNTER — Ambulatory Visit: Payer: BC Managed Care – PPO | Admitting: Nurse Practitioner

## 2022-04-28 ENCOUNTER — Ambulatory Visit (INDEPENDENT_AMBULATORY_CARE_PROVIDER_SITE_OTHER): Payer: BC Managed Care – PPO

## 2022-04-28 VITALS — BP 124/66 | HR 75 | Temp 98.2°F | Ht 66.0 in | Wt 190.6 lb

## 2022-04-28 DIAGNOSIS — J3089 Other allergic rhinitis: Secondary | ICD-10-CM

## 2022-04-28 DIAGNOSIS — M549 Dorsalgia, unspecified: Secondary | ICD-10-CM | POA: Insufficient documentation

## 2022-04-28 DIAGNOSIS — J4540 Moderate persistent asthma, uncomplicated: Secondary | ICD-10-CM | POA: Diagnosis not present

## 2022-04-28 DIAGNOSIS — R0609 Other forms of dyspnea: Secondary | ICD-10-CM

## 2022-04-28 DIAGNOSIS — J309 Allergic rhinitis, unspecified: Secondary | ICD-10-CM | POA: Insufficient documentation

## 2022-04-28 DIAGNOSIS — J4530 Mild persistent asthma, uncomplicated: Secondary | ICD-10-CM | POA: Insufficient documentation

## 2022-04-28 DIAGNOSIS — J4541 Moderate persistent asthma with (acute) exacerbation: Secondary | ICD-10-CM

## 2022-04-28 DIAGNOSIS — M546 Pain in thoracic spine: Secondary | ICD-10-CM

## 2022-04-28 IMAGING — DX DG CHEST 2V
2 series · 2 of 2 positions shown · non-contrast
Comparison: Chest two views [DATE]

CLINICAL DATA: Productive cough.  Shortness of breath.

EXAM:
CHEST - 2 VIEW

[chest pa]
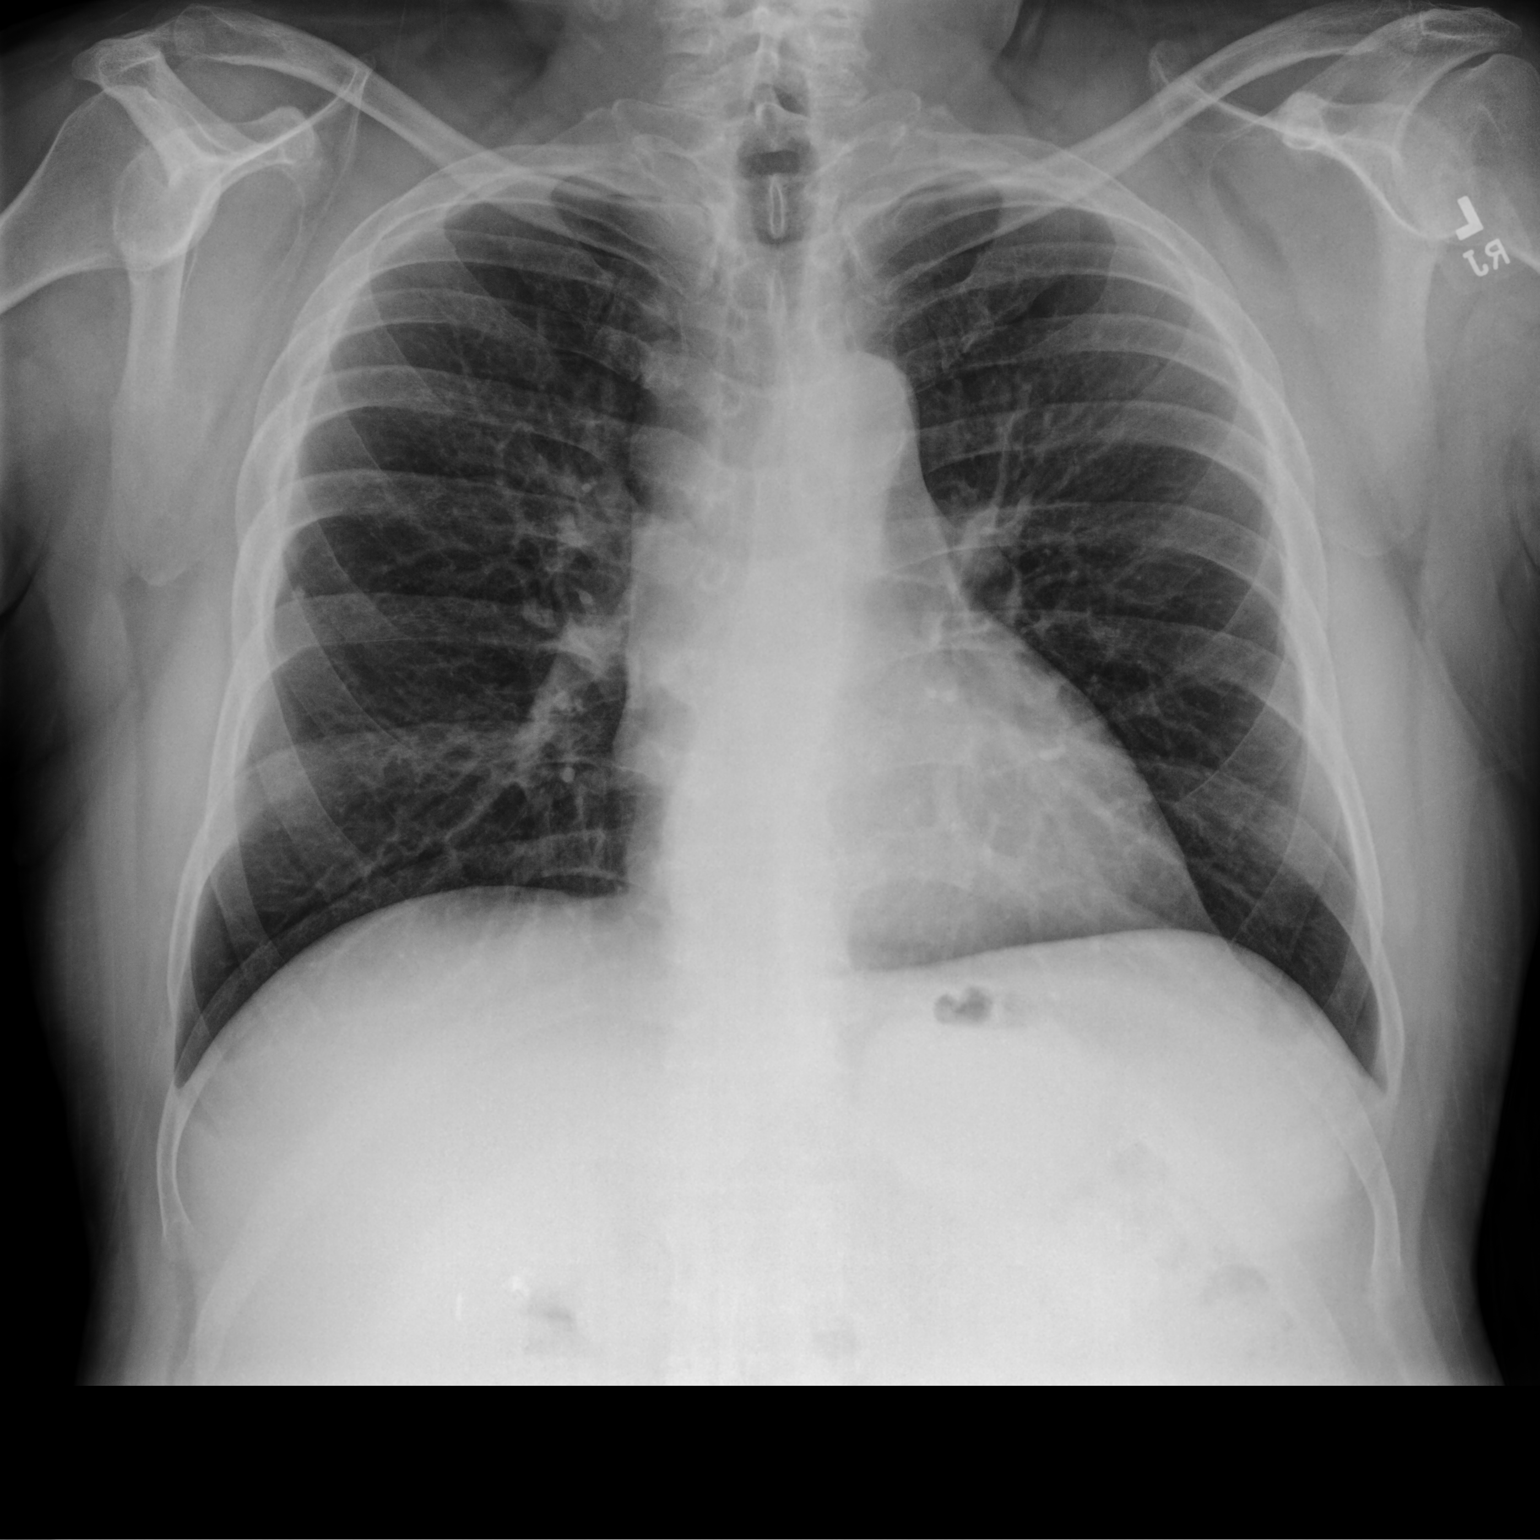

[chest lat]
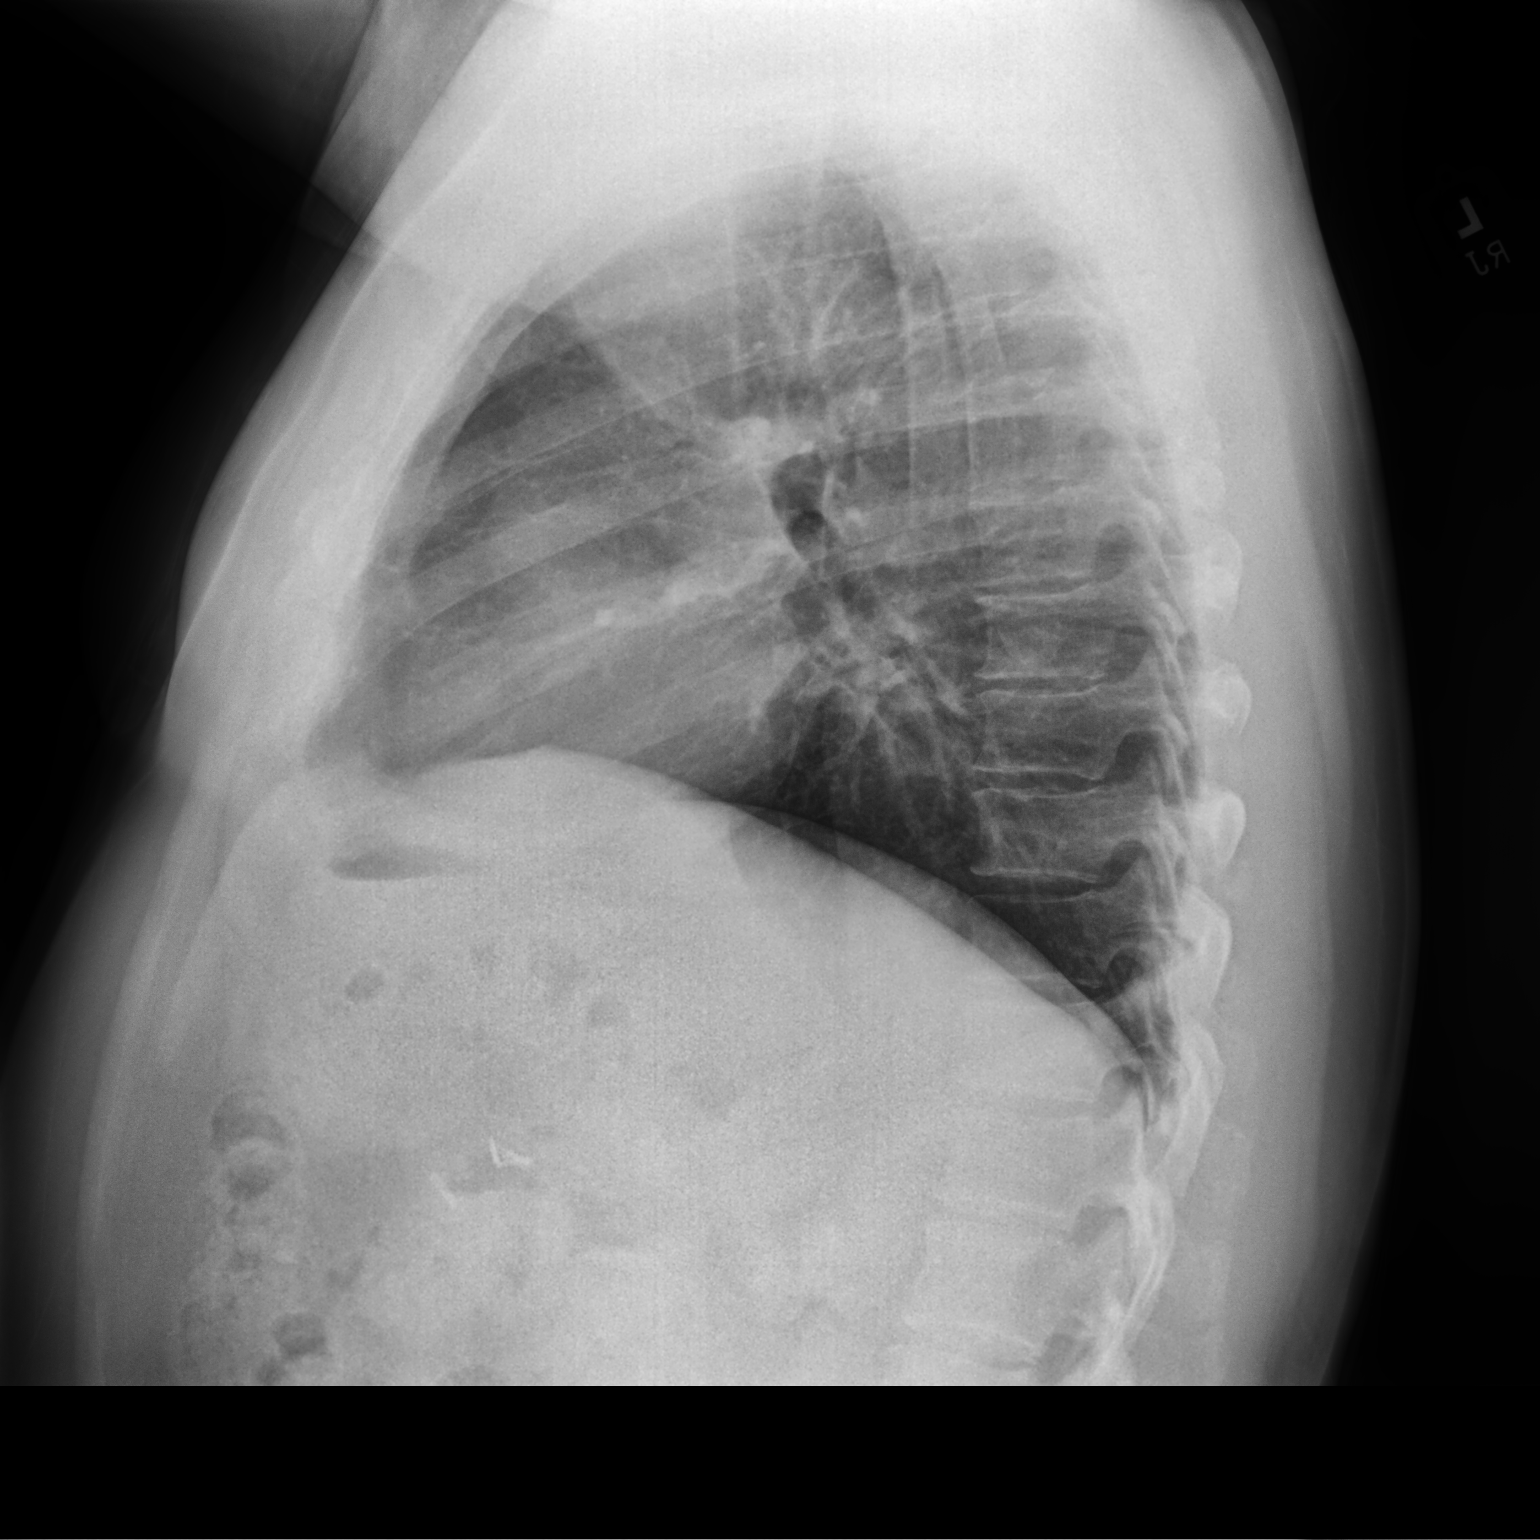

[2 of 2 positions shown; findings below may reference images not displayed]

FINDINGS: Cardiac silhouette and mediastinal contours are within normal
limits. Mild calcification within aortic arch. The lungs are clear.
No pleural effusion or pneumothorax. Mild multilevel degenerative
disc changes of the thoracic spine. Cholecystectomy clips.
IMPRESSION: No active cardiopulmonary disease.

## 2022-04-28 MED ORDER — PREDNISONE 20 MG PO TABS: 40.0000 mg | ORAL_TABLET | Freq: Every day | ORAL | 0 refills | Status: AC

## 2022-04-28 MED ORDER — AZITHROMYCIN 250 MG PO TABS: ORAL_TABLET | ORAL | 0 refills | Status: DC

## 2022-04-28 NOTE — Assessment & Plan Note (Signed)
Likely r/t asthma exacerbation. Wells score low probability. CXR today.  ?

## 2022-04-28 NOTE — Progress Notes (Signed)
? ?'@Patient'$  ID: Rickey Cruz, male    DOB: Jun 01, 1961, 61 y.o.   MRN: [id number] ? ?Chief Complaint  ?Patient presents with  ? Acute Visit  ?  Pt states that he is having some coughing and SOB. SOB started Monday but the cough has been occurring for one month. Pt was given steroid shot yesterday. Pt states that his cough is productive with green mucus. Pt states that he is also having mid-upper back pain   ? ? ?Referring provider: ?Horald Pollen, * ? ?HPI: ?61 year old male, never smoker followed for moderate persistent asthma, pulmonary nodules.  He has [name] and last seen in office 09/13/2021.  He is followed by radiation oncology for stage IIa prostate cancer.  Past medical history significant for hypertension, pulmonary hypertension, psoriasis, rosacea, HLD, prediabetes.  Previously was diagnosed with OSA and on CPAP therapy.  Underwent nasal septal surgery and UPPP in 2014; repeat sleep study showed that OSA had resolved. ? ?TEST/EVENTS:  ?08/2019 NPSG: No significant events, TST 6 hours ?03/2021 spirometry: Normal ?10/31/2021 CXR 2 view: Aortic atherosclerosis.  There are low lung volumes with bibasilar atelectasis.  There is mild perihilar predominant bronchial wall thickening, could reflect bronchitis/bronchiolitis. ? ?09/13/2021: OV with Dr. Elsworth Soho.  Mild persistent asthma triggered by seasonal allergies.  Not currently using any maintenance inhalers.  Advised him to resume taking Singulair during the fall.  Counseled that if he should develop any wheezing or other symptoms he should restart his inhaler therapies. ? ?04/28/2022: Today-acute ?Patient presents today for suspected asthma exacerbation. He reports having a productive cough for the past month with green to yellow sputum. He then began having increased shortness of breath and some wheezing earlier this week. He has also had some mid back pain and is unsure if it is related. He was seen yesterday by Dr. Neldon Mc with allergy and started on  Breztri as well as given a steroid shot.  Does not feel much better at this point.  He denies any lower extremity swelling, calf pain, hemoptysis, fevers.  Has not had any URI symptoms.  He is taking Breztri as ordered.  He has yet to start Singulair and Claritin. ? ?No Known Allergies ? ?Immunization History  ?Administered Date(s) Administered  ? Hepatitis A, Adult 06/27/2014, 05/19/2016  ? Hepatitis B, adult 06/27/2014, 09/07/2014, 12/28/2014  ? Influenza,inj,Quad PF,6+ Mos 11/25/2017, 11/01/2018, 08/28/2020, 09/13/2021  ? Meningococcal Conjugate 06/27/2014  ? Moderna Sars-Covid-2 Vaccination 03/12/2020, 04/14/2020  ? PPD Test 04/13/2009  ? Pneumococcal Polysaccharide-23 11/25/2017  ? Tdap 06/27/2014  ? ? ?Past Medical History:  ?Diagnosis Date  ? Allergy   ? Flonase, Patanol, Zyrtec  ? Arthritis   ? psoriasis  ? Asthma   ? seasonal, with allergies  ? Borderline diabetes   ? Cluster headache   ? Colon polyp 10/27/2011  ? Depression   ? denies 11/11/16; denies 11/21/18 "been years"  ? Eczema   ? GERD (gastroesophageal reflux disease)   ? History of kidney stones   ? x1  ? Hyperlipidemia   ? Hypertension   ? Low back pain   ? "in kidney area-was told that it was related to gallstones"  ? Prediabetes   ? Prostate cancer ([place])   ? Psoriasis   ? [name]  ? Severe sleep apnea   ? h/o; had procedure and weight control, does not have to wear CPAP  ? Tuberculosis   ? granuloma on lungs, 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  ? ? ?Tobacco History: ?Social  History  ? ?Tobacco Use  ?Smoking Status Never  ?Smokeless Tobacco Never  ? ?Counseling given: Not Answered ? ? ?Outpatient Medications Prior to Visit  ?Medication Sig Dispense Refill  ? albuterol (VENTOLIN HFA) 108 (90 Base) MCG/ACT inhaler INHALE 2 PUFFS INTO THE LUNGS EVERY 4 HOURS AS NEEDED FOR WHEEZING OR SHORTNESS OF BREATH(COUGHING FITS) 8.5 g 1  ? amLODipine (NORVASC) 5 MG tablet TAKE 1 TABLET(5 MG) BY MOUTH DAILY 90 tablet 3  ? Budeson-Glycopyrrol-Formoterol  (BREZTRI AEROSPHERE) 160-9-4.8 MCG/ACT AERO Inhale two puffs with spacer twice daily to prevent cough or wheeze.  Rinse, gargle, and spit after use. 10.7 g 5  ? celecoxib (CELEBREX) 200 MG capsule TAKE 1 CAPSULE(200 MG) BY MOUTH DAILY WITH FOOD    ? clobetasol (OLUX) 0.05 % topical foam APPLY EXTERNALLY TO THE AFFECTED AREA TWICE DAILY 50 g 3  ? clobetasol ointment (TEMOVATE) 0.05 % APPLY EXTERNALLY TO THE AFFECTED AREA TWICE DAILY 30 g 3  ? fluocinolone (SYNALAR) 0.025 % ointment 1 application    ? fluocinolone (VANOS) 0.01 % cream Apply topically daily. 30 g 1  ? fluocinonide cream (LIDEX) 0.05 % APPLY SPARINGLY EXTERNALLY TO THE AFFECTED AREA TWICE DAILY 30 g 1  ? fluticasone (FLONASE) 50 MCG/ACT nasal spray Place 2 sprays into both nostrils daily. 16 g 2  ? hydrochlorothiazide (HYDRODIURIL) 12.5 MG tablet Take by mouth.    ? hydrocortisone (ANUSOL-HC) 2.5 % rectal cream APPLY RECTALLY TO THE AFFECTED AREA TWICE DAILY 30 g 5  ? ibuprofen (ADVIL) 600 MG tablet Take 1 tablet (600 mg total) by mouth every 6 (six) hours as needed. 30 tablet 0  ? lisinopril (ZESTRIL) 40 MG tablet Take 1 tablet (40 mg total) by mouth daily. 90 tablet 3  ? loratadine (CLARITIN) 10 MG tablet Take 1 tablet (10 mg total) by mouth 2 (two) times daily. 60 tablet 5  ? montelukast (SINGULAIR) 10 MG tablet TAKE 1 TABLET(10 MG) BY MOUTH AT BEDTIME 90 tablet 3  ? rosuvastatin (CRESTOR) 10 MG tablet TAKE 1 TABLET(10 MG) BY MOUTH DAILY 90 tablet 1  ? ?No facility-administered medications prior to visit.  ? ? ? ?Review of Systems:  ? ?Constitutional: No weight loss or gain, night sweats, fevers, chills, fatigue, or lassitude. ?HEENT: No headaches, difficulty swallowing, tooth/dental problems, or sore throat. No sneezing, itching, ear ache, nasal congestion, or post nasal drip ?CV:  No chest pain, orthopnea, PND, swelling in lower extremities, anasarca, dizziness, palpitations, syncope ?Resp: +shortness of breath with exertion; productive cough;  minimal wheeze. No hemoptysis. No chest wall deformity ?GI:  No heartburn, indigestion, abdominal pain, nausea, vomiting, diarrhea, change in bowel habits, loss of appetite, bloody stools.  ?Skin: No rash, lesions, ulcerations ?MSK:  No joint pain or swelling.  No decreased range of motion.  No back pain. ?Neuro: No dizziness or lightheadedness.  ?Psych: No depression or anxiety. Mood stable.  ? ? ? ?Physical Exam: ? ?BP 124/66 (BP Location: Left Arm, Patient Position: Sitting, Cuff Size: Normal)   Pulse 75   Temp 98.2 ?F (36.8 ?C) (Oral)   Ht '5\' 6"'$  (1.676 m)   Wt 190 lb 9.6 oz (86.5 kg)   SpO2 99%   BMI 30.76 kg/m?  ? ?GEN: Pleasant, interactive, well-appearing; obese; in no acute distress. ?HEENT:  Normocephalic and atraumatic. EACs patent bilaterally. TM pearly gray with present light reflex bilaterally. PERRLA. Sclera white. Nasal turbinates erythematous, moist and patent bilaterally. No rhinorrhea present. Oropharynx pink and moist, without exudate or edema. No  lesions, ulcerations, or postnasal drip.  ?NECK:  Supple w/ fair ROM. No JVD present. Normal carotid impulses w/o bruits. Thyroid symmetrical with no goiter or nodules palpated. No lymphadenopathy.   ?CV: RRR, no m/r/g, no peripheral edema. Pulses intact, +2 bilaterally. No cyanosis, pallor or clubbing. ?PULMONARY:  Unlabored, regular breathing. Minimal end expiratory wheeze bilaterally A&P. No accessory muscle use. No dullness to percussion. ?GI: BS present and normoactive. Soft, non-tender to palpation. No organomegaly or masses detected. No CVA tenderness. ?MSK: No erythema, warmth or tenderness. Cap refil <2 sec all extrem. No deformities or joint swelling noted.  ?Neuro: A/Ox3. No focal deficits noted.   ?Skin: Warm, no lesions or rashe ?Psych: Normal affect and behavior. Judgement and thought content appropriate.  ? ? ? ?Lab Results: ? ?CBC ?   ?Component Value Date/Time  ? WBC 9.9 09/19/2021 1304  ? RBC 5.38 09/19/2021 1304  ? HGB 16.4  09/19/2021 1304  ? HGB 16.6 12/06/2019 1506  ? HCT 47.0 09/19/2021 1304  ? HCT 47.9 12/06/2019 1506  ? PLT 173 09/19/2021 1304  ? PLT 249 12/06/2019 1506  ? MCV 87.4 09/19/2021 1304  ? MCV 90 12/06/2019 1506  ? MCH 30.5 09/

## 2022-04-28 NOTE — Assessment & Plan Note (Signed)
Advised to start on regimen as prescribed by Dr. Neldon Mc.  ?

## 2022-04-28 NOTE — Assessment & Plan Note (Signed)
Exacerbation with acute bronchitis. Unable to complete FeNO today. Prednisone burst and azithro course. Advised to continue triple therapy. Will obtain CXR today to r/o superimposed infection. ? ?Patient Instructions  ?Continue Breztri 2 puffs Twice daily. Brush tongue and rinse mouth afterwards ?Continue Albuterol inhaler 2 puffs or 3 mL neb every 6 hours as needed for shortness of breath or wheezing. Notify if symptoms persist despite rescue inhaler/neb use. ?Continue Flonase nasal spray 2 sprays each nostril daily ?Start Claritin as directed by allergist ?Restart Singulair 10 mg at bedtime ? ?Prednisone 40 mg for 5 days. Take in AM with food. ?Z pack. Take 2 tabs on day one followed by 1 tab daily for four additional days. Take with food. ?Mucinex DM 600 mg Twice daily for chest congestion/cough ?Chlorpheniramine tab 4 mg At bedtime for cough ? ?Chest x ray today. We will notify you of any abnormal results. ? ?Follow up in 2 weeks with Dr. Elsworth Soho or Alanson Aly. If symptoms do not improve or worsen, please contact office for sooner follow up or seek emergency care. ? ? ?

## 2022-04-28 NOTE — Patient Instructions (Addendum)
Continue Breztri 2 puffs Twice daily. Brush tongue and rinse mouth afterwards ?Continue Albuterol inhaler 2 puffs or 3 mL neb every 6 hours as needed for shortness of breath or wheezing. Notify if symptoms persist despite rescue inhaler/neb use. ?Continue Flonase nasal spray 2 sprays each nostril daily ?Start Claritin as directed by allergist ?Restart Singulair 10 mg at bedtime ? ?Prednisone 40 mg for 5 days. Take in AM with food. ?Z pack. Take 2 tabs on day one followed by 1 tab daily for four additional days. Take with food. ?Mucinex DM 600 mg Twice daily for chest congestion/cough ?Chlorpheniramine tab 4 mg At bedtime for cough ? ?Chest x ray today. We will notify you of any abnormal results. ? ?Follow up in 2 weeks with Dr. Elsworth Soho or Alanson Aly. If symptoms do not improve or worsen, please contact office for sooner follow up or seek emergency care. ?

## 2022-04-28 NOTE — Assessment & Plan Note (Signed)
Possibly musculoskeletal from persistent coughing. No tenderness upon exam. Advised to use OTC topical analgesic and use Celebrex to see if this helps. Notify PCP if pain persists despite improvement of other symptoms.  ?

## 2022-04-28 NOTE — Progress Notes (Signed)
Please notify patient CXR clear.

## 2022-04-29 ENCOUNTER — Ambulatory Visit
Admission: RE | Admit: 2022-04-29 | Discharge: 2022-04-29 | Disposition: A | Discharge status: Home / Self Care | Payer: BC Managed Care – PPO | Source: Ambulatory Visit | Attending: Emergency Medicine | Admitting: Emergency Medicine

## 2022-04-29 DIAGNOSIS — Z87442 Personal history of urinary calculi: Secondary | ICD-10-CM

## 2022-04-29 IMAGING — US US RENAL
1 series · 14 of 25 positions shown · non-contrast
Comparison: CT abdomen and pelvis [DATE]

CLINICAL DATA: Left kidney stone.

EXAM:
RENAL / URINARY TRACT ULTRASOUND COMPLETE

[Series 1: us renal · 0.23mm/px · 44 acquisitions, 14 frames shown]
[im 1/44]
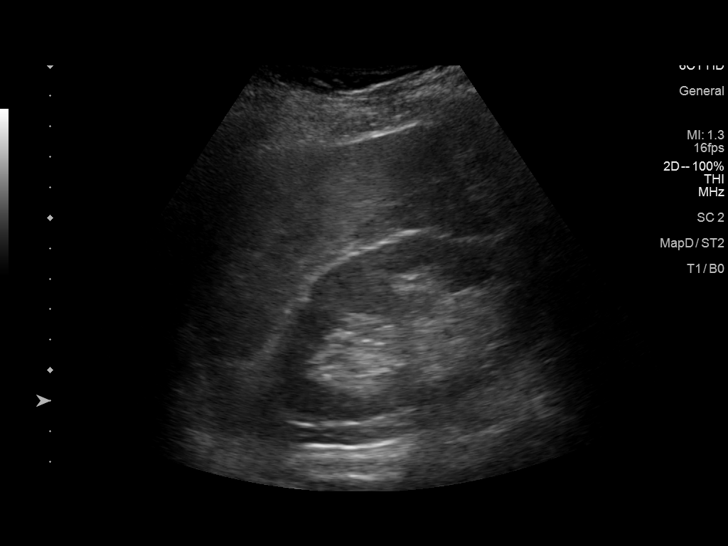
[im 4/44]
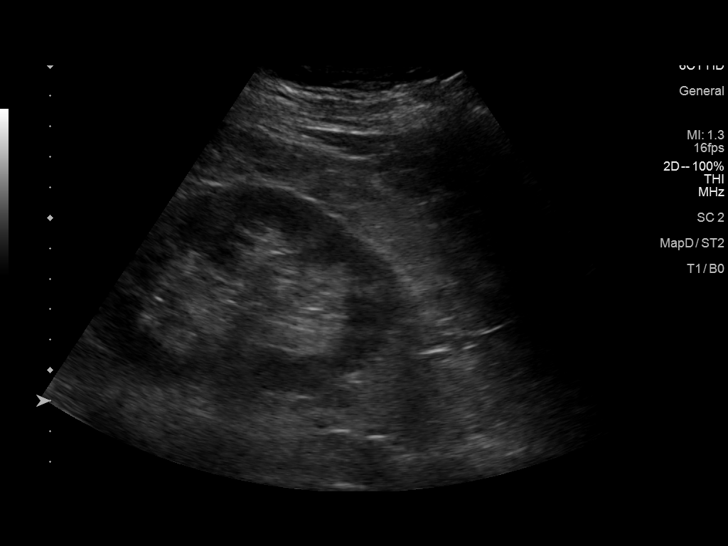
[im 8/44]
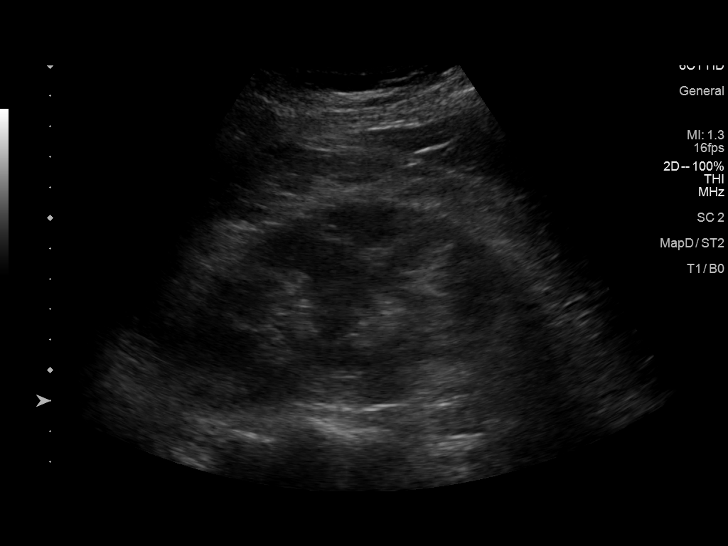
[im 11/44]
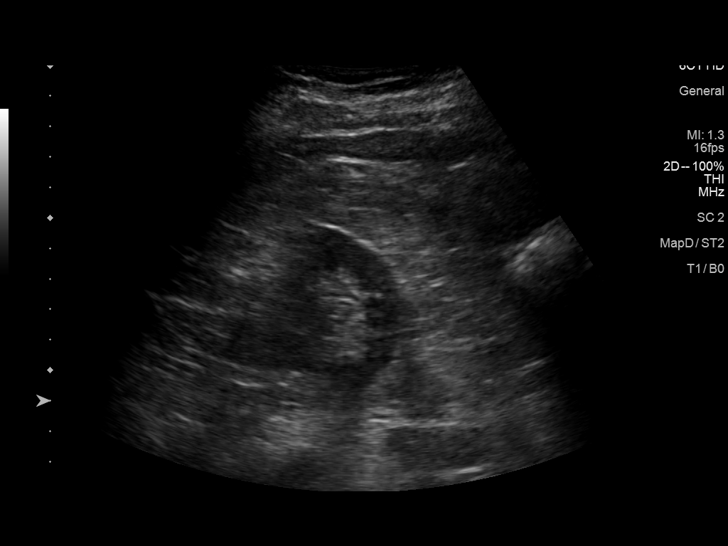
[im 15/44]
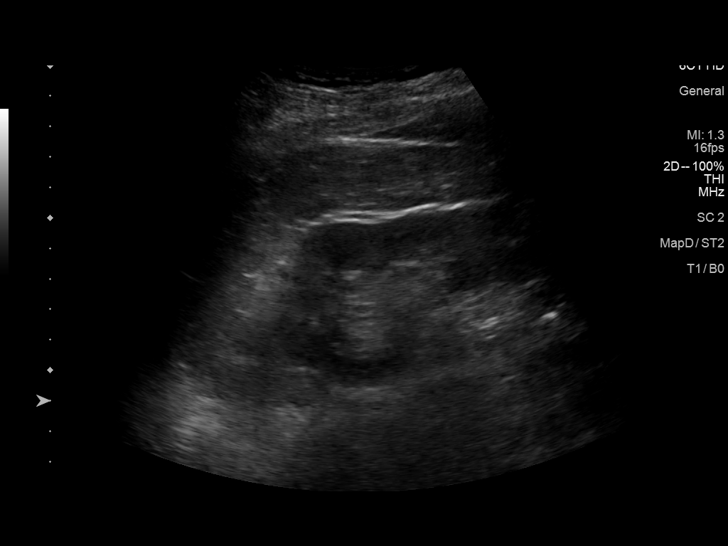
[im 17/44]
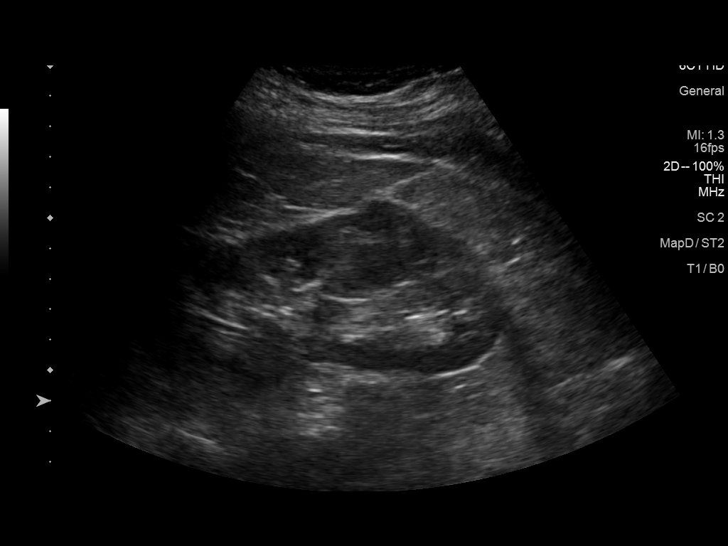
[im 20/44]
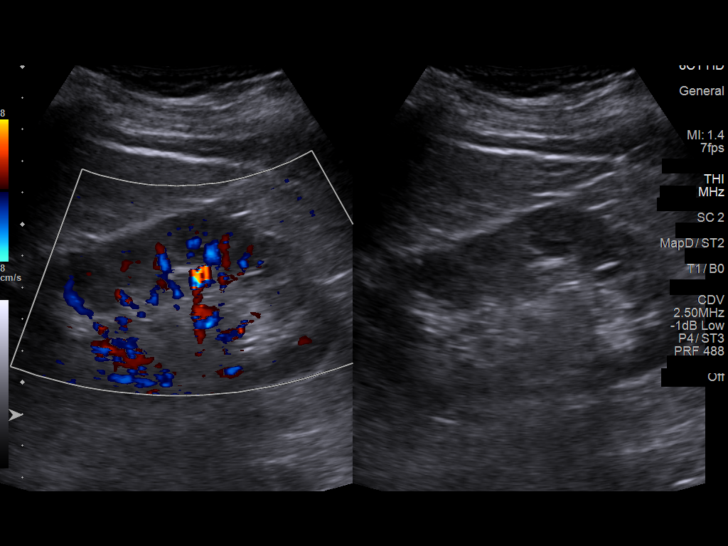
[im 24/44]
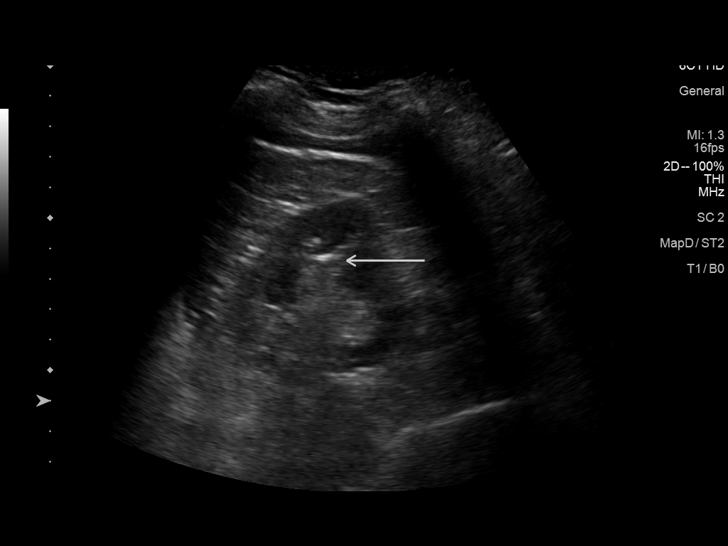
[im 27/44]
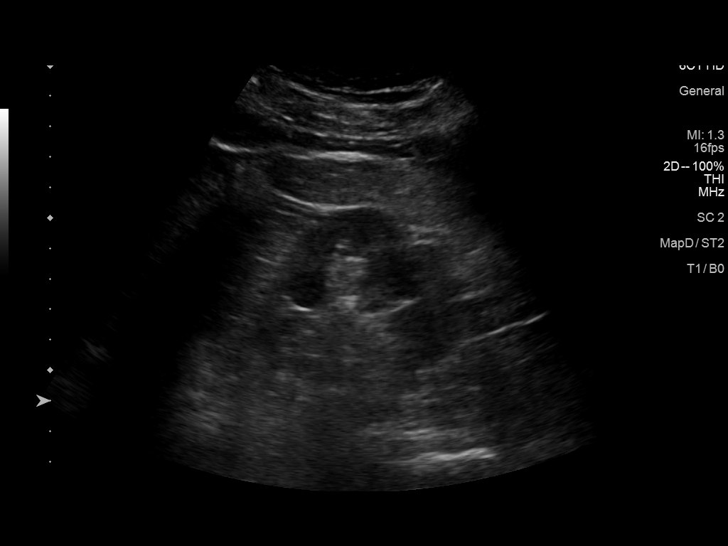
[im 29/44]
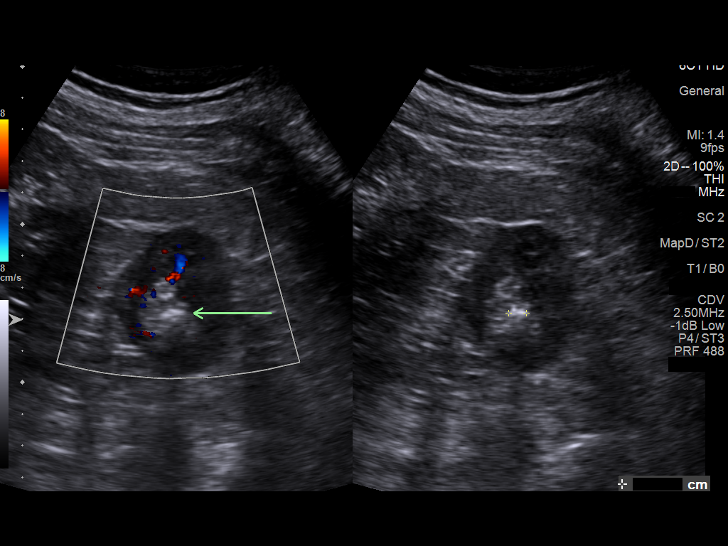
[im 33/44]
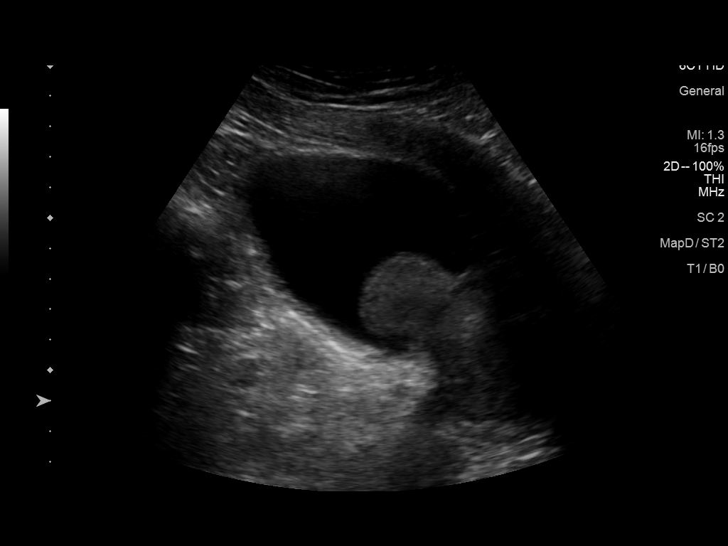
[im 36/44]
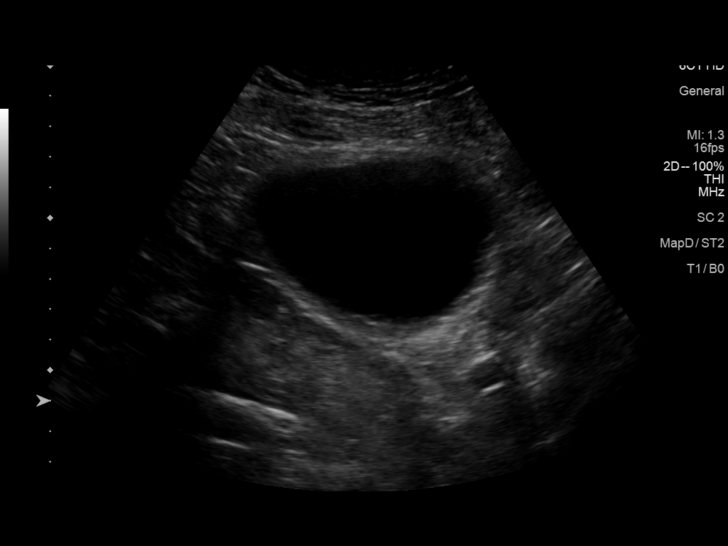
[im 40/44]
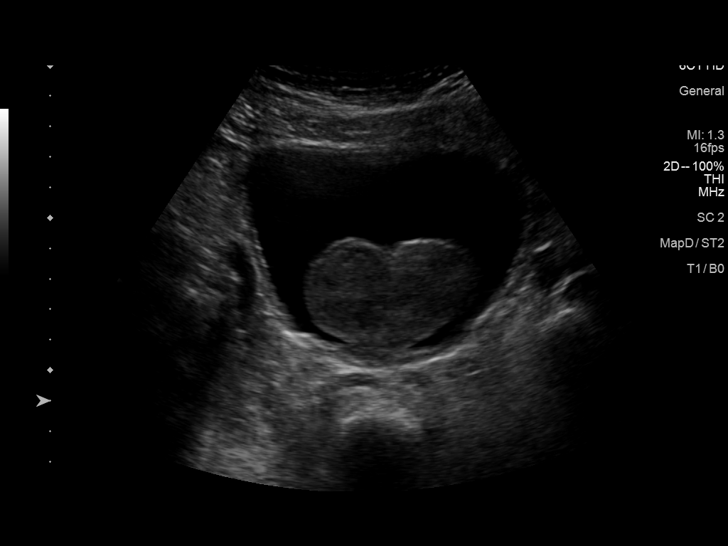
[im 44/44]
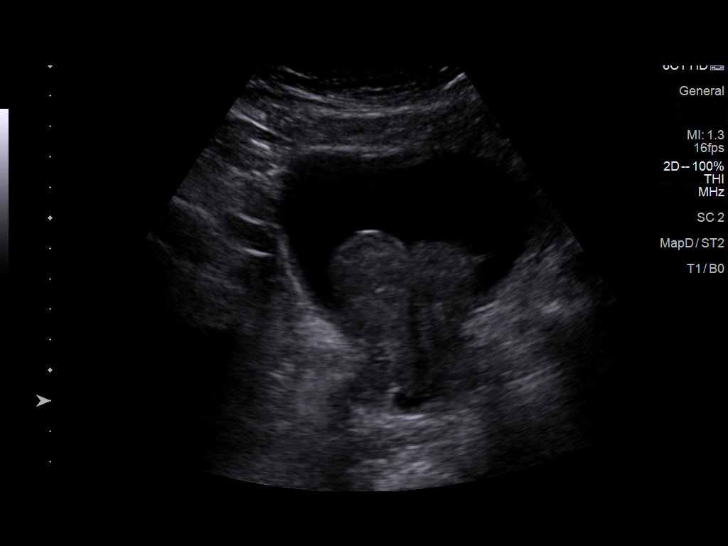

[14 of 25 positions shown; findings below may reference images not displayed]

FINDINGS: Right Kidney:

Renal measurements: 10.5 x 6.0 x 5.8 cm = volume: 192 mL.
Echogenicity within normal limits. No mass or hydronephrosis
visualized.

Left Kidney:

Renal measurements: 12.7 x 5.3 x 6.0 cm = volume: 207 mL.
Echogenicity within normal limits. There are 2 calculi scratch at
there are 3 calculi visualized measuring up to 1 cm in the mid and
lower pole of the kidney. No mass or hydronephrosis visualized.

Bladder:

Appears normal for degree of bladder distention. There is nodular
protrusion of the prostate gland into the bladder base. Prostate
gland is enlarged measuring 8.4 x 5.1 x 6.7 cm. Appearance is
similar to prior.

Other:

None.
IMPRESSION: 1. Nonobstructing left renal calculi.
2. Unchanged nodular protrusion of the prostate into the bladder
base. Recommend clinical correlation and follow-up

## 2022-05-03 ENCOUNTER — Encounter: Payer: Self-pay | Admitting: Emergency Medicine

## 2022-05-03 ENCOUNTER — Other Ambulatory Visit: Payer: Self-pay | Admitting: Emergency Medicine

## 2022-05-03 DIAGNOSIS — N200 Calculus of kidney: Secondary | ICD-10-CM

## 2022-05-04 ENCOUNTER — Telehealth: Payer: Self-pay | Admitting: Emergency Medicine

## 2022-05-04 NOTE — Telephone Encounter (Signed)
PT calls today in regards to their referral to Urology. PT was wondering if we knew which urology office he would be referred out to as well as which provider he'd be referred to. I had let him know that on my end it didn't look like that had been decided just yet but maybe Dr.Sagardia had someone in mind. ? ?CB: [phone] ?

## 2022-05-05 ENCOUNTER — Encounter (HOSPITAL_BASED_OUTPATIENT_CLINIC_OR_DEPARTMENT_OTHER): Payer: Self-pay | Admitting: Certified Registered"

## 2022-05-05 ENCOUNTER — Other Ambulatory Visit: Payer: Self-pay | Admitting: Urology

## 2022-05-05 ENCOUNTER — Encounter (HOSPITAL_BASED_OUTPATIENT_CLINIC_OR_DEPARTMENT_OTHER): Payer: Self-pay | Admitting: Urology

## 2022-05-05 NOTE — Progress Notes (Signed)
Patient pre-op phone call complete. Arrival time and date verified. Patient allergies and medications verified. Patient denied any tobacco use and history of COVID. Patient stated that he was having chest discomfort with allergies, but had an X-ray performed on 04/27/2022 and images showed no active cardiopulmonary disease. See results in Epic. Patient advised not to take Hydrochlorothiazide the morning of his procedure. Patient advised not to have Celebrex, Ibuprofen, or any other NSAIDs within 48 hours of his procedure. Patient also advised not to have any Aspirin within 72 hours prior to procedure. Patient stated that he does not have Sleep Apnea as of 2014. Patient instructed to take laxative of choice on Sunday. Patient can have clear liquids until 0515 and to be NPO after midnight. No alcohol to be consumed within 24 hours prior to procedure. Driver secured  ?

## 2022-05-09 ENCOUNTER — Ambulatory Visit (HOSPITAL_BASED_OUTPATIENT_CLINIC_OR_DEPARTMENT_OTHER)
Admission: RE | Admit: 2022-05-09 | Discharge: 2022-05-09 | Disposition: A | Discharge status: Home / Self Care | Payer: BC Managed Care – PPO | Attending: Urology | Admitting: Urology

## 2022-05-09 ENCOUNTER — Encounter (HOSPITAL_BASED_OUTPATIENT_CLINIC_OR_DEPARTMENT_OTHER)
Admission: RE | Disposition: A | Discharge status: Home / Self Care | Payer: Self-pay | Source: Home / Self Care | Attending: Urology

## 2022-05-09 ENCOUNTER — Ambulatory Visit (HOSPITAL_COMMUNITY): Payer: BC Managed Care – PPO

## 2022-05-09 ENCOUNTER — Ambulatory Visit: Payer: BC Managed Care – PPO | Admitting: Internal Medicine

## 2022-05-09 ENCOUNTER — Encounter (HOSPITAL_BASED_OUTPATIENT_CLINIC_OR_DEPARTMENT_OTHER): Payer: Self-pay | Admitting: Urology

## 2022-05-09 DIAGNOSIS — N200 Calculus of kidney: Secondary | ICD-10-CM | POA: Insufficient documentation

## 2022-05-09 DIAGNOSIS — I10 Essential (primary) hypertension: Secondary | ICD-10-CM | POA: Insufficient documentation

## 2022-05-09 DIAGNOSIS — Z8546 Personal history of malignant neoplasm of prostate: Secondary | ICD-10-CM | POA: Diagnosis not present

## 2022-05-09 DIAGNOSIS — G4730 Sleep apnea, unspecified: Secondary | ICD-10-CM | POA: Diagnosis not present

## 2022-05-09 DIAGNOSIS — J45909 Unspecified asthma, uncomplicated: Secondary | ICD-10-CM | POA: Insufficient documentation

## 2022-05-09 HISTORY — PX: EXTRACORPOREAL SHOCK WAVE LITHOTRIPSY: SHX1557

## 2022-05-09 IMAGING — DX DG ABDOMEN 1V
1 series · 1 of 1 positions shown · non-contrast
Comparison: Abdomen [DATE]. Renal ultrasound [DATE]. CT
[DATE].

CLINICAL DATA: Left-sided flank pain.  Patient for lithotripsy.

EXAM:
ABDOMEN - 1 VIEW

[abdomen kub]
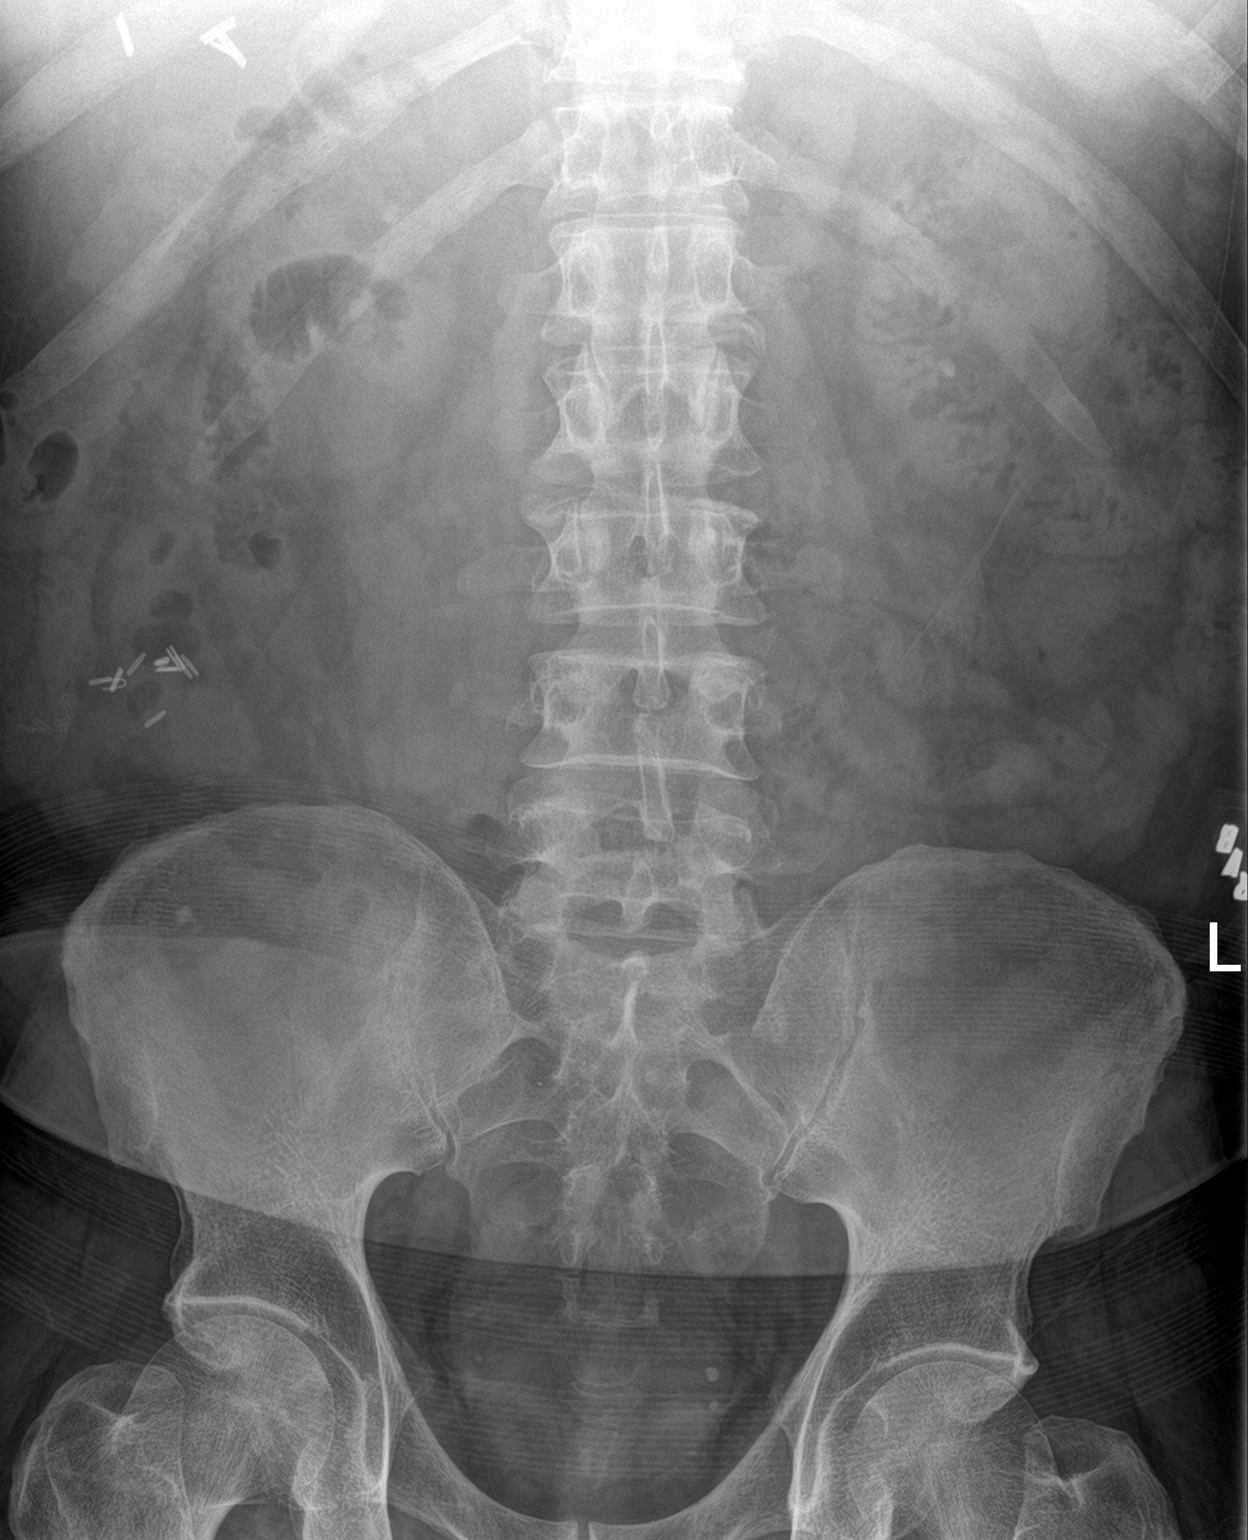

[1 of 1 positions shown; findings below may reference images not displayed]

FINDINGS: Surgical clips noted over the abdomen. Stool noted throughout the
colon. No bowel distention. 5 mm left renal stone again noted in
similar position. Calcific densities over the right kidney cannot be
completely excluded. This may represent overlying bowel/stool.
Pelvic calcifications appear stable and are most likely phleboliths.
Degenerative change lumbar spine and both hips. Tiny sclerotic
density again noted over the right ilium, most likely a benign bone
island.
IMPRESSION: 1.  5 mm left renal stone again noted in similar position.

2. Calcific densities in the right kidney cannot be completely
excluded. These may represent overlying bowel/stool. Pelvic
calcifications appear stable and are most likely phleboliths.

## 2022-05-09 SURGERY — Surgical Case
Anesthesia: *Unknown

## 2022-05-09 SURGERY — LITHOTRIPSY, ESWL
Anesthesia: LOCAL | Laterality: Left

## 2022-05-09 MED ORDER — SODIUM CHLORIDE 0.9 % IV SOLN: INTRAVENOUS | Status: DC

## 2022-05-09 MED ORDER — HYDROMORPHONE HCL 1 MG/ML IJ SOLN
INTRAMUSCULAR | Status: AC
  Filled 2022-05-09: qty 1

## 2022-05-09 MED ORDER — CIPROFLOXACIN HCL 500 MG PO TABS
ORAL_TABLET | ORAL | Status: AC
  Filled 2022-05-09: qty 1

## 2022-05-09 MED ORDER — DIAZEPAM 5 MG PO TABS
10.0000 mg | ORAL_TABLET | ORAL | Status: AC
  Administered 2022-05-09: 10 mg via ORAL

## 2022-05-09 MED ORDER — CIPROFLOXACIN HCL 500 MG PO TABS
500.0000 mg | ORAL_TABLET | ORAL | Status: AC
  Administered 2022-05-09: 500 mg via ORAL

## 2022-05-09 MED ORDER — DIPHENHYDRAMINE HCL 25 MG PO CAPS
25.0000 mg | ORAL_CAPSULE | ORAL | Status: AC
  Administered 2022-05-09: 25 mg via ORAL

## 2022-05-09 MED ORDER — DIAZEPAM 5 MG PO TABS
ORAL_TABLET | ORAL | Status: AC
  Filled 2022-05-09: qty 2

## 2022-05-09 MED ORDER — HYDROMORPHONE HCL 1 MG/ML IJ SOLN
0.5000 mg | Freq: Once | INTRAMUSCULAR | Status: AC
  Administered 2022-05-09: 0.5 mg via INTRAVENOUS

## 2022-05-09 MED ORDER — DIPHENHYDRAMINE HCL 25 MG PO CAPS
ORAL_CAPSULE | ORAL | Status: AC
  Filled 2022-05-09: qty 1

## 2022-05-09 MED ORDER — ONDANSETRON HCL 4 MG PO TABS: 4.0000 mg | ORAL_TABLET | Freq: Three times a day (TID) | ORAL | 1 refills | Status: DC | PRN

## 2022-05-09 MED ORDER — OXYCODONE-ACETAMINOPHEN 5-325 MG PO TABS: 1.0000 | ORAL_TABLET | Freq: Four times a day (QID) | ORAL | 0 refills | Status: DC | PRN

## 2022-05-09 NOTE — H&P (Signed)
Rickey Cruz is an 61 y.o. male.   ? ?Chief Complaint: Pre-Op LEFT Shockwve Lithotripsy ? ?HPI:  ? ?1 - Non-Obstructing Left Rneal Stones - scattered NON-onbstructing left renal stones incidental on [place] 2023 on eval back pain. No prior stone passage. No hydro. UA withtou infectious parmeters. He has massive prostate with very low risk cancer on surveillance by another provider. ? ?Today "Rickey Cruz" is seen to proceed with LEFT shockwave lithotripsy. He has intermittent back pain and his goals are to treat stone to tule this out as etiology. He has been counseled that non-obstrucitng stones typically are not source of significant pain. ? ?Past Medical History:  ?Diagnosis Date  ? Allergy   ? Flonase, Patanol, Zyrtec  ? Arthritis   ? psoriasis  ? Asthma   ? seasonal, with allergies  ? Borderline diabetes   ? Cluster headache   ? Colon polyp 10/27/2011  ? Depression   ? denies 11/11/16; denies 11/21/18 "been years"  ? Eczema   ? GERD (gastroesophageal reflux disease)   ? History of kidney stones   ? x1  ? Hyperlipidemia   ? Hypertension   ? Low back pain   ? "in kidney area-was told that it was related to gallstones"  ? Prediabetes   ? Prostate cancer ([place])   ? Psoriasis   ? Rickey Cruz/dermatology  ? Severe sleep apnea   ? h/o; had procedure and weight control, does not have to wear CPAP  ? Tuberculosis   ? granuloma on lungs, 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  ? ? ?Past Surgical History:  ?Procedure Laterality Date  ? APPENDECTOMY    ? CHOLECYSTECTOMY N/A 12/16/2014  ? Procedure: LAPAROSCOPIC CHOLECYSTECTOMY WITH INTRAOPERATIVE CHOLANGIOGRAM;  Surgeon: [name], MD;  Location: [facility];  Service: General;  Laterality: N/A;  ? COLONOSCOPY  10/27/2011  ? single polyp. Repeat 5 years.  ? POLYPECTOMY    ? PROSTATE BIOPSY  10/2014  ? will have another in 3 months  ? SEPTOPLASTY  2014  ? TONSILLECTOMY  2014  ? UVULOPALATOPHARYNGOPLASTY  2014  ? ? ?Family History  ?Problem Relation Age of Onset  ? Stroke Mother   ? Asthma Sister   ? Colon  cancer Neg Hx   ? Esophageal cancer Neg Hx   ? Stomach cancer Neg Hx   ? Prostate cancer Neg Hx   ? Diabetes Neg Hx   ? CAD Neg Hx   ? Migraines Neg Hx   ? Headache Neg Hx   ? ?Social History:  reports that he has never smoked. He has never used smokeless tobacco. He reports that he does not drink alcohol and does not use drugs. ? ?Allergies: No Known Allergies ? ?No medications prior to admission.  ? ? ?No results found for this or any previous visit (from the past 48 hour(s)). ?No results found. ? ?Review of Systems  ?HENT: Negative.    ?Genitourinary:  Positive for urgency.  ?All other systems reviewed and are negative. ? ?There were no vitals taken for this visit. ?Physical Exam ?Vitals reviewed.  ?Constitutional:   ?   Comments: Very pleasant, AOx3.   ?HENT:  ?   Head: Normocephalic.  ?Eyes:  ?   Pupils: Pupils are equal, round, and reactive to light.  ?Cardiovascular:  ?   Rate and Rhythm: Normal rate.  ?Abdominal:  ?   General: Abdomen is flat.  ?Genitourinary: ?   Comments: No CVAT at present ?Musculoskeletal:  ?   Cervical back: Normal range of  motion.  ?Neurological:  ?   General: No focal deficit present.  ?   Mental Status: He is alert.  ?  ? ?Assessment/Plan ? ?Proceed as planned with LEFT shockwave lithotripsy. I frankly discussed with Ishmail again that this may not change is back pain and that shockwave cannot gaurontee succeful treatment of multifocal stones and I will treat them in order of descending size / propensity for passage to maximize potential benefit. (Start with any upper pole, larger).  ? ?Alexis Frock, MD ?05/09/2022, 8:25 AM ? ? ? ?

## 2022-05-09 NOTE — Discharge Instructions (Signed)
1 - You may have urinary urgency (bladder spasms), pass small stone fragments, and bloody urine on / off for up to 2 weeks. This is normal. ° °2 - Call MD or go to ER for fever >102, severe pain / nausea / vomiting not relieved by medications, or acute change in medical status ° °

## 2022-05-09 NOTE — Brief Op Note (Signed)
05/09/2022 ? ?12:17 PM ? ?PATIENT:  Brewer Hitchman  61 y.o. male ? ?PRE-OPERATIVE DIAGNOSIS:  LEFT RENAL CALCULUS ? ?POST-OPERATIVE DIAGNOSIS:  * No post-op diagnosis entered * ? ?PROCEDURE:  Procedure(s): ?EXTRACORPOREAL SHOCK WAVE LITHOTRIPSY (ESWL) (Left) ? ?SURGEON:  Surgeon(s) and Role: ?   Alexis Frock, MD - Primary ? ?PHYSICIAN ASSISTANT:  ? ?ASSISTANTS: none  ? ?ANESTHESIA:   MAC ? ?EBL:  minimal  ? ?BLOOD ADMINISTERED:none ? ?DRAINS: none  ? ?LOCAL MEDICATIONS USED:  NONE ? ?SPECIMEN:  No Specimen ? ?DISPOSITION OF SPECIMEN:  N/A ? ?COUNTS:  YES ? ?TOURNIQUET:  * No tourniquets in log * ? ?DICTATION: .Note written in paper chart ? ?PLAN OF CARE: Discharge to home after PACU ? ?PATIENT DISPOSITION:  Short Stay ?  ?Delay start of Pharmacological VTE agent (>24hrs) due to surgical blood loss or risk of bleeding: not applicable ? ?

## 2022-05-10 ENCOUNTER — Encounter (HOSPITAL_BASED_OUTPATIENT_CLINIC_OR_DEPARTMENT_OTHER): Payer: Self-pay | Admitting: Urology

## 2022-05-10 ENCOUNTER — Ambulatory Visit: Payer: BC Managed Care – PPO | Admitting: Nurse Practitioner

## 2022-05-18 ENCOUNTER — Ambulatory Visit (HOSPITAL_COMMUNITY)
Admission: EM | Admit: 2022-05-18 | Discharge: 2022-05-18 | Disposition: A | Discharge status: Home / Self Care | Payer: BC Managed Care – PPO | Source: Home / Self Care

## 2022-05-18 ENCOUNTER — Encounter (HOSPITAL_COMMUNITY): Payer: Self-pay | Admitting: Emergency Medicine

## 2022-05-18 ENCOUNTER — Other Ambulatory Visit: Payer: Self-pay

## 2022-05-18 ENCOUNTER — Emergency Department (HOSPITAL_COMMUNITY)
Admission: EM | Admit: 2022-05-18 | Discharge: 2022-05-18 | Disposition: A | Discharge status: Home / Self Care | Payer: BC Managed Care – PPO | Attending: Emergency Medicine | Admitting: Emergency Medicine

## 2022-05-18 ENCOUNTER — Ambulatory Visit (INDEPENDENT_AMBULATORY_CARE_PROVIDER_SITE_OTHER): Payer: BC Managed Care – PPO

## 2022-05-18 DIAGNOSIS — I10 Essential (primary) hypertension: Secondary | ICD-10-CM | POA: Diagnosis not present

## 2022-05-18 DIAGNOSIS — Z7951 Long term (current) use of inhaled steroids: Secondary | ICD-10-CM | POA: Diagnosis not present

## 2022-05-18 DIAGNOSIS — R109 Unspecified abdominal pain: Secondary | ICD-10-CM | POA: Diagnosis present

## 2022-05-18 DIAGNOSIS — J45909 Unspecified asthma, uncomplicated: Secondary | ICD-10-CM | POA: Insufficient documentation

## 2022-05-18 DIAGNOSIS — Z79899 Other long term (current) drug therapy: Secondary | ICD-10-CM | POA: Insufficient documentation

## 2022-05-18 DIAGNOSIS — Z8546 Personal history of malignant neoplasm of prostate: Secondary | ICD-10-CM | POA: Diagnosis not present

## 2022-05-18 DIAGNOSIS — N200 Calculus of kidney: Secondary | ICD-10-CM | POA: Insufficient documentation

## 2022-05-18 DIAGNOSIS — R10A2 Flank pain, left side: Secondary | ICD-10-CM

## 2022-05-18 LAB — COMPREHENSIVE METABOLIC PANEL
ALT: 33 U/L (ref 0–44)
ALT: 35 U/L (ref 0–44)
AST: 22 U/L (ref 15–41)
AST: 24 U/L (ref 15–41)
Albumin: 4 g/dL (ref 3.5–5.0)
Albumin: 4.5 g/dL (ref 3.5–5.0)
Alkaline Phosphatase: 46 U/L (ref 38–126)
Alkaline Phosphatase: 53 U/L (ref 38–126)
Anion gap: 9 (ref 5–15)
Anion gap: 10 (ref 5–15)
BUN: 13 mg/dL (ref 6–20)
BUN: 16 mg/dL (ref 6–20)
CO2: 23 mmol/L (ref 22–32)
CO2: 22 mmol/L (ref 22–32)
Calcium: 8.8 mg/dL — ABNORMAL LOW (ref 8.9–10.3)
Calcium: 9.1 mg/dL (ref 8.9–10.3)
Chloride: 106 mmol/L (ref 98–111)
Chloride: 108 mmol/L (ref 98–111)
Creatinine, Ser: 1.02 mg/dL (ref 0.61–1.24)
Creatinine, Ser: 1.04 mg/dL (ref 0.61–1.24)
GFR, Estimated: >60 mL/min (ref >60)
GFR, Estimated: >60 mL/min (ref >60)
Glucose, Bld: 137 mg/dL — ABNORMAL HIGH (ref 70–99)
Glucose, Bld: 123 mg/dL — ABNORMAL HIGH (ref 70–99)
Potassium: 3.4 mmol/L — ABNORMAL LOW (ref 3.5–5.1)
Potassium: 3.5 mmol/L (ref 3.5–5.1)
Sodium: 138 mmol/L (ref 135–145)
Sodium: 140 mmol/L (ref 135–145)
Total Bilirubin: 1 mg/dL (ref 0.3–1.2)
Total Bilirubin: 1 mg/dL (ref 0.3–1.2)
Total Protein: 6.9 g/dL (ref 6.5–8.1)
Total Protein: 7.8 g/dL (ref 6.5–8.1)

## 2022-05-18 LAB — CBC WITH DIFFERENTIAL/PLATELET
Abs Immature Granulocytes: 0.05 10*3/uL (ref 0.00–0.07)
Abs Immature Granulocytes: 0.04 10*3/uL (ref 0.00–0.07)
Basophils Absolute: 0 10*3/uL (ref 0.0–0.1)
Basophils Absolute: 0 10*3/uL (ref 0.0–0.1)
Basophils Relative: 0 %
Basophils Relative: 0 %
Eosinophils Absolute: 0.1 10*3/uL (ref 0.0–0.5)
Eosinophils Absolute: 0 10*3/uL (ref 0.0–0.5)
Eosinophils Relative: 1 %
Eosinophils Relative: 0 %
HCT: 45.4 % (ref 39.0–52.0)
HCT: 48.7 % (ref 39.0–52.0)
Hemoglobin: 16.3 g/dL (ref 13.0–17.0)
Hemoglobin: 17 g/dL (ref 13.0–17.0)
Immature Granulocytes: 1 %
Immature Granulocytes: 1 %
Lymphocytes Relative: 8 %
Lymphocytes Relative: 8 %
Lymphs Abs: 0.6 10*3/uL — ABNORMAL LOW (ref 0.7–4.0)
Lymphs Abs: 0.6 10*3/uL — ABNORMAL LOW (ref 0.7–4.0)
MCH: 31.2 pg (ref 26.0–34.0)
MCH: 30.7 pg (ref 26.0–34.0)
MCHC: 35.9 g/dL (ref 30.0–36.0)
MCHC: 34.9 g/dL (ref 30.0–36.0)
MCV: 86.8 fL (ref 80.0–100.0)
MCV: 87.9 fL (ref 80.0–100.0)
Monocytes Absolute: 0.5 10*3/uL (ref 0.1–1.0)
Monocytes Absolute: 0.5 10*3/uL (ref 0.1–1.0)
Monocytes Relative: 7 %
Monocytes Relative: 7 %
Neutro Abs: 6.2 10*3/uL (ref 1.7–7.7)
Neutro Abs: 6.4 10*3/uL (ref 1.7–7.7)
Neutrophils Relative %: 83 %
Neutrophils Relative %: 84 %
Platelets: 226 10*3/uL (ref 150–400)
Platelets: 235 10*3/uL (ref 150–400)
RBC: 5.23 MIL/uL (ref 4.22–5.81)
RBC: 5.54 MIL/uL (ref 4.22–5.81)
RDW: 12 % (ref 11.5–15.5)
RDW: 12.2 % (ref 11.5–15.5)
WBC: 7.5 10*3/uL (ref 4.0–10.5)
WBC: 7.6 10*3/uL (ref 4.0–10.5)
nRBC: 0 % (ref 0.0–0.2)
nRBC: 0 % (ref 0.0–0.2)

## 2022-05-18 LAB — URINALYSIS, ROUTINE W REFLEX MICROSCOPIC
Bacteria, UA: NONE SEEN
Bilirubin Urine: NEGATIVE
Glucose, UA: NEGATIVE mg/dL
Ketones, ur: 5 mg/dL — AB
Leukocytes,Ua: NEGATIVE
Nitrite: NEGATIVE
Protein, ur: NEGATIVE mg/dL
RBC / HPF: >50 RBC/hpf — ABNORMAL HIGH (ref 0–5)
Specific Gravity, Urine: 1.012 (ref 1.005–1.030)
pH: 6 (ref 5.0–8.0)

## 2022-05-18 LAB — POCT URINALYSIS DIPSTICK, ED / UC
Bilirubin Urine: NEGATIVE
Glucose, UA: NEGATIVE mg/dL
Ketones, ur: NEGATIVE mg/dL
Leukocytes,Ua: NEGATIVE
Nitrite: NEGATIVE
Protein, ur: 30 mg/dL — AB
Specific Gravity, Urine: 1.025 (ref 1.005–1.030)
Urobilinogen, UA: 0.2 mg/dL (ref 0.0–1.0)
pH: 5.5 (ref 5.0–8.0)

## 2022-05-18 MED ORDER — OXYCODONE-ACETAMINOPHEN 5-325 MG PO TABS: 1.0000 | ORAL_TABLET | Freq: Four times a day (QID) | ORAL | 0 refills | Status: DC | PRN

## 2022-05-18 MED ORDER — ONDANSETRON HCL 4 MG/2ML IJ SOLN
4.0000 mg | Freq: Once | INTRAMUSCULAR | Status: AC
  Administered 2022-05-18: 4 mg via INTRAVENOUS
  Filled 2022-05-18: qty 2

## 2022-05-18 MED ORDER — KETOROLAC TROMETHAMINE 30 MG/ML IJ SOLN
15.0000 mg | Freq: Once | INTRAMUSCULAR | Status: AC
  Administered 2022-05-18: 15 mg via INTRAVENOUS
  Filled 2022-05-18: qty 1

## 2022-05-18 MED ORDER — OXYCODONE-ACETAMINOPHEN 5-325 MG PO TABS
1.0000 | ORAL_TABLET | Freq: Once | ORAL | Status: AC
  Administered 2022-05-18: 1 via ORAL
  Filled 2022-05-18: qty 1

## 2022-05-18 MED ORDER — KETOROLAC TROMETHAMINE 30 MG/ML IJ SOLN
30.0000 mg | Freq: Once | INTRAMUSCULAR | Status: AC
  Administered 2022-05-18: 30 mg via INTRAVENOUS
  Filled 2022-05-18: qty 1

## 2022-05-18 MED ORDER — ONDANSETRON 4 MG PO TBDP: ORAL_TABLET | ORAL | 0 refills | Status: DC

## 2022-05-18 MED ORDER — HYDROCODONE-ACETAMINOPHEN 5-325 MG PO TABS: 1.0000 | ORAL_TABLET | Freq: Four times a day (QID) | ORAL | 0 refills | Status: DC | PRN

## 2022-05-18 NOTE — ED Notes (Signed)
Discharge instructions reviewed, questions answered. Rx education provided. Pt states understanding and no further questions. Pt ambulatory with steady gait upon discharge. No s/s of distress noted. ? ?

## 2022-05-18 NOTE — ED Provider Notes (Signed)
[place]    CSN: [id number] Arrival date & time: 05/18/22  1105      History   Chief Complaint Chief Complaint  Patient presents with   Abdominal Pain    HPI Rickey Cruz is a 61 y.o. male.   Patient presents today with a year-long history of intermittent left flank pain that is significantly worsened in the past 24 hours.  He has been seen by urology and has had KUBs that have shown between 3 and 1 left renal calculi.  He underwent lithotripsy 05/09/2022 but has not passed stone despite straining urine.  He reports that today pain significantly increased and is localized to left flank with radiation to lower abdomen.  He denies any nausea, vomiting, changes in bowel movements, melena, hematochezia.  He is passing urine.  He was prescribed an antibiotic at the time of lithotripsy but does not member the name and is not currently taking this.  He has not tried any over-the-counter medication for symptom management.  He denies history of gastrointestinal disorder.  He is status post cholecystectomy but denies additional abdominal surgeries.  He reports pain is severe and rated 7 on a 0-10 pain scale, localized to left flank, described as intense aching with periodic sharp pains, no aggravating relieving factors identified.   Past Medical History:  Diagnosis Date   Allergy    Flonase, Patanol, Zyrtec   Arthritis    psoriasis   Asthma    seasonal, with allergies   Borderline diabetes    Cluster headache    Colon polyp 10/27/2011   Depression    denies 11/11/16; denies 11/21/18 "been years"   Eczema    GERD (gastroesophageal reflux disease)    History of kidney stones    x1   Hyperlipidemia    Hypertension    Low back pain    "in kidney area-was told that it was related to gallstones"   Prediabetes    Prostate cancer ([place])    Psoriasis    [name]   Severe sleep apnea    h/o; had procedure and weight control, does not have to wear CPAP    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, 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    Patient Active Problem List   Diagnosis Date Noted   Moderate persistent asthma with acute exacerbation 04/28/2022   Allergic rhinitis 04/28/2022   Back pain 04/28/2022   DOE (dyspnea on exertion) 04/28/2022   History of prostate cancer 04/25/2022   History of kidney stones 04/25/2022   Rosacea 04/19/2022   Malignant neoplasm of prostate ([place]) 04/19/2022   Enlarged prostate 01/12/2022   Neck pain 08/10/2021   Jammed interphalangeal joint of finger of left hand 03/10/2021   Closed nondisplaced fracture of fifth metacarpal bone of right hand 03/10/2021   Closed nondisplaced fracture of shaft of fourth metacarpal bone of right hand 03/10/2021   Prediabetes 04/02/2019   Dyslipidemia 12/23/2014   Psoriasis 06/27/2014   Multiple lung nodules 12/09/2011   Musculoskeletal back pain 06/10/2010   PULMONARY HYPERTENSION 03/04/2010   Essential hypertension 02/10/2010   OBSTRUCTIVE SLEEP APNEA 02/04/2010    Past Surgical History:  Procedure Laterality Date   APPENDECTOMY     CHOLECYSTECTOMY N/A 12/16/2014   Procedure: LAPAROSCOPIC CHOLECYSTECTOMY WITH INTRAOPERATIVE CHOLANGIOGRAM;  Surgeon: [name], MD;  Location: [facility];  Service: General;  Laterality: N/A;   COLONOSCOPY  10/27/2011   single polyp. Repeat 5 years.   EXTRACORPOREAL SHOCK WAVE LITHOTRIPSY Left 05/09/2022   Procedure:  EXTRACORPOREAL SHOCK WAVE LITHOTRIPSY (ESWL);  Surgeon: Alexis Frock, MD;  Location: [facility];  Service: Urology;  Laterality: Left;   POLYPECTOMY     PROSTATE BIOPSY  10/2014   will have another in 3 months   SEPTOPLASTY  2014   TONSILLECTOMY  2014   UVULOPALATOPHARYNGOPLASTY  2014       Home Medications    Prior to Admission medications   Medication Sig Start Date End Date Taking? Authorizing Provider  albuterol (VENTOLIN HFA) 108 (90 Base) MCG/ACT inhaler INHALE 2 PUFFS INTO THE LUNGS EVERY 4 HOURS AS NEEDED FOR  WHEEZING OR SHORTNESS OF BREATH(COUGHING FITS) 12/21/21   Kozlow, Donnamarie Poag, MD  amLODipine (NORVASC) 5 MG tablet TAKE 1 TABLET(5 MG) BY MOUTH DAILY 12/21/21   Horald Pollen, MD  Budeson-Glycopyrrol-Formoterol (BREZTRI AEROSPHERE) 160-9-4.8 MCG/ACT AERO Inhale two puffs with spacer twice daily to prevent cough or wheeze.  Rinse, gargle, and spit after use. 04/27/22   Kozlow, Donnamarie Poag, MD  celecoxib (CELEBREX) 200 MG capsule TAKE 1 CAPSULE(200 MG) BY MOUTH DAILY WITH FOOD 02/14/22   [provider]  clobetasol (OLUX) 0.05 % topical foam APPLY EXTERNALLY TO THE AFFECTED AREA TWICE DAILY 03/31/20   Jacelyn Pi, Lilia Argue, MD  clobetasol ointment (TEMOVATE) 0.05 % APPLY EXTERNALLY TO THE AFFECTED AREA TWICE DAILY 04/17/20   Jacelyn Pi, Lilia Argue, MD  fluocinolone (SYNALAR) 0.025 % ointment 1 application    [provider]  fluocinolone (VANOS) 0.01 % cream Apply topically daily. 12/06/19   Jacelyn Pi, Lilia Argue, MD  fluocinonide cream (LIDEX) 0.05 % APPLY SPARINGLY EXTERNALLY TO THE AFFECTED AREA TWICE DAILY 02/02/20   Jacelyn Pi, Lilia Argue, MD  fluticasone [facility]) 50 MCG/ACT nasal spray Place 2 sprays into both nostrils daily. 03/28/22   Roney [name], MD  hydrochlorothiazide (HYDRODIURIL) 12.5 MG tablet Take by mouth. 03/02/21   [provider]  hydrocortisone (ANUSOL-HC) 2.5 % rectal cream APPLY RECTALLY TO THE AFFECTED AREA TWICE DAILY 03/20/22   Horald Pollen, MD  ibuprofen (ADVIL) 600 MG tablet Take 1 tablet (600 mg total) by mouth every 6 (six) hours as needed. 06/03/20   Melynda Ripple, MD  ketoconazole (NIZORAL) 2 % shampoo Apply 1 application. topically 2 (two) times a week.    [provider]  lisinopril (ZESTRIL) 40 MG tablet Take 1 tablet (40 mg total) by mouth daily. 07/07/21   Horald Pollen, MD  loratadine (CLARITIN) 10 MG tablet Take 1 tablet (10 mg total) by mouth 2 (two) times daily. 11/02/21   Kozlow, Donnamarie Poag, MD  montelukast (SINGULAIR) 10  MG tablet TAKE 1 TABLET(10 MG) BY MOUTH AT BEDTIME 08/28/20   Lauraine Rinne, NP  ondansetron (ZOFRAN) 4 MG tablet Take 1 tablet (4 mg total) by mouth every 8 (eight) hours as needed for nausea or vomiting. 05/09/22 05/09/23  Alexis Frock, MD  oxyCODONE-acetaminophen (PERCOCET) 5-325 MG tablet Take 1 tablet by mouth every 6 (six) hours as needed for severe pain (after lithotripsy). 05/09/22 05/09/23  Alexis Frock, MD  rosuvastatin (CRESTOR) 10 MG tablet TAKE 1 TABLET(10 MG) BY MOUTH DAILY 02/14/22   Horald Pollen, MD    Family History Family History  Problem Relation Age of Onset   Stroke Mother    Asthma Sister    Colon cancer Neg Hx    Esophageal cancer Neg Hx    Stomach cancer Neg Hx    Prostate cancer Neg Hx    Diabetes Neg Hx  CAD Neg Hx    Migraines Neg Hx    Headache Neg Hx     Social History Social History   Tobacco Use   Smoking status: Never   Smokeless tobacco: Never  Vaping Use   Vaping Use: Never used  Substance Use Topics   Alcohol use: Never    Alcohol/week: 0.0 standard drinks   Drug use: Never     Allergies   Patient has no known allergies.   Review of Systems Review of Systems  Constitutional:  Positive for activity change. Negative for appetite change, fatigue and fever.  Respiratory:  Negative for cough and shortness of breath.   Cardiovascular:  Negative for chest pain.  Gastrointestinal:  Positive for abdominal pain. Negative for diarrhea, nausea and vomiting.  Genitourinary:  Positive for flank pain. Negative for dysuria, frequency, hematuria, penile discharge and urgency.  Musculoskeletal:  Negative for arthralgias, back pain and myalgias.    Physical Exam Triage Vital Signs ED Triage Vitals  Enc Vitals Group     BP 05/18/22 1237 135/85     Pulse Rate 05/18/22 1237 96     Resp 05/18/22 1237 (!) 24     Temp 05/18/22 1237 98 F (36.7 C)     Temp Source 05/18/22 1237 Oral     SpO2 05/18/22 1237 96 %     Weight --      Height  --      Head Circumference --      Peak Flow --      Pain Score 05/18/22 1235 7     Pain Loc --      Pain Edu? --      Excl. in [place]? --    No data found.  Updated Vital Signs BP 135/85 (BP Location: Right Arm)   Pulse 96   Temp 98 F (36.7 C) (Oral)   Resp (!) 24   SpO2 96%   Visual Acuity Right Eye Distance:   Left Eye Distance:   Bilateral Distance:    Right Eye Near:   Left Eye Near:    Bilateral Near:     Physical Exam Vitals reviewed.  Constitutional:      General: He is awake.     Appearance: Normal appearance. He is well-developed. He is not ill-appearing.     Comments: Very pleasant male appears stated age in no acute distress sitting comfortably in exam room rubbing left flank  HENT:     Head: Normocephalic and atraumatic.     Mouth/Throat:     Pharynx: Uvula midline.  Cardiovascular:     Rate and Rhythm: Normal rate and regular rhythm.     Heart sounds: Normal heart sounds, S1 normal and S2 normal. No murmur heard. Pulmonary:     Effort: Pulmonary effort is normal.     Breath sounds: Normal breath sounds. No stridor. No wheezing, rhonchi or rales.     Comments: Clear to auscultation bilaterally Abdominal:     General: Bowel sounds are normal.     Palpations: Abdomen is soft.     Tenderness: There is no abdominal tenderness. There is left CVA tenderness. There is no right CVA tenderness, guarding or rebound.     Comments: No significant tenderness palpation of abdomen.  Positive for CVA tenderness on left.  Skin:    Comments: No bruising or rash noted on exam.  Neurological:     Mental Status: He is alert.  Psychiatric:        Behavior: Behavior  is cooperative.     UC Treatments / Results  Labs (all labs ordered are listed, but only abnormal results are displayed) Labs Reviewed  POCT URINALYSIS DIPSTICK, ED / UC - Abnormal; Notable for the following components:      Result Value   Hgb urine dipstick LARGE (*)    Protein, ur 30 (*)    All other  components within normal limits  URINE CULTURE  CBC WITH DIFFERENTIAL/PLATELET  COMPREHENSIVE METABOLIC PANEL    EKG   Radiology DG Abdomen 1 View  Result Date: 05/18/2022 CLINICAL DATA:  Left flank pain.  Recent lithotripsy EXAM: ABDOMEN - 1 VIEW COMPARISON:  05/09/2022 FINDINGS: Two small calculi overlying the left lower pole. These likely are fragmented pieces of the slightly larger stone seen on the prior study. No ureteral stone. No right renal calculi Phleboliths in left pelvis unchanged. IMPRESSION: Left renal calculus appears fragmented, now overlying the left lower pole. No ureteral calculus identified. Electronically Signed   By: Franchot Gallo M.D.   On: 05/18/2022 13:28    Procedures Procedures (including critical care time)  Medications Ordered in UC Medications - No data to display  Initial Impression / Assessment and Plan / UC Course  I have reviewed the triage vital signs and the nursing notes.  Pertinent labs & imaging results that were available during my care of the patient were reviewed by me and considered in my medical decision making (see chart for details).     Given severity of pain initially discussed potential utility of going to the ER.  Patient initially declined this and so work-up was initiated in urgent care.  KUB obtained showed fragmented calculus without ureteral calculus.  UA showed hematuria without evidence of acute infection.  CBC and CMP were obtained but patient reported significant worsening of symptoms and so agreed to go to the ER for further evaluation and management before results were available.  Given sudden severe worsening recommended ER evaluation since we do not have advanced imaging capabilities.  Vital signs were stable at the time of discharge and patient will go directly to [facility], ER.  Final Clinical Impressions(s) / UC Diagnoses   Final diagnoses:  Left flank pain  Nephrolithiasis   Discharge Instructions   None     ED Prescriptions   None    PDMP not reviewed this encounter.   Terrilee Croak, PA-C 05/18/22 1404

## 2022-05-18 NOTE — ED Provider Notes (Signed)
[place] DEPT Provider Note   CSN: [id number] Arrival date & time: 05/18/22  1424     History  Chief Complaint  Patient presents with   Flank Pain    Rickey Cruz is a 61 y.o. male.  Patient with a history of kidney stones asthma hypertension.  He had lithotripsy last week and is complaining of flank pain  The history is provided by the patient and medical records. No language interpreter was used.  Flank Pain This is a recurrent problem. The current episode started 6 to 12 hours ago. The problem occurs constantly. The problem has not changed since onset.Pertinent negatives include no chest pain, no abdominal pain and no headaches. Nothing aggravates the symptoms. He has tried nothing for the symptoms. The treatment provided no relief.      Home Medications Prior to Admission medications   Medication Sig Start Date End Date Taking? Authorizing Provider  albuterol (VENTOLIN HFA) 108 (90 Base) MCG/ACT inhaler INHALE 2 PUFFS INTO THE LUNGS EVERY 4 HOURS AS NEEDED FOR WHEEZING OR SHORTNESS OF BREATH(COUGHING FITS) 12/21/21  Yes Kozlow, Donnamarie Poag, MD  amLODipine (NORVASC) 5 MG tablet TAKE 1 TABLET(5 MG) BY MOUTH DAILY 12/21/21  Yes Sagardia, Ines Bloomer, MD  Budeson-Glycopyrrol-Formoterol (BREZTRI AEROSPHERE) 160-9-4.8 MCG/ACT AERO Inhale two puffs with spacer twice daily to prevent cough or wheeze.  Rinse, gargle, and spit after use. Patient taking differently: Inhale 2 puffs into the lungs 2 (two) times daily as needed (cough or wheezing). Rinse, gargle, and spit after use. 04/27/22  Yes Kozlow, Donnamarie Poag, MD  clobetasol (OLUX) 0.05 % topical foam APPLY EXTERNALLY TO THE AFFECTED AREA TWICE DAILY 03/31/20  Yes Jacelyn Pi, Irma M, MD  lisinopril (ZESTRIL) 40 MG tablet Take 1 tablet (40 mg total) by mouth daily. 07/07/21  Yes Sagardia, Ines Bloomer, MD  ondansetron (ZOFRAN-ODT) 4 MG disintegrating tablet '4mg'$  ODT q4 hours prn nausea/vomit 05/18/22  Yes Milton Ferguson,  MD  oxyCODONE-acetaminophen (PERCOCET) 5-325 MG tablet Take 1 tablet by mouth every 6 (six) hours as needed. 05/18/22  Yes Milton Ferguson, MD  rosuvastatin (CRESTOR) 10 MG tablet TAKE 1 TABLET(10 MG) BY MOUTH DAILY 02/14/22  Yes Sagardia, Ines Bloomer, MD  clobetasol ointment (TEMOVATE) 0.05 % APPLY EXTERNALLY TO THE AFFECTED AREA TWICE DAILY Patient not taking: Reported on 05/18/2022 04/17/20   Jacelyn Pi, Lilia Argue, MD  fluocinolone (VANOS) 0.01 % cream Apply topically daily. Patient not taking: Reported on 05/18/2022 12/06/19   Jacelyn Pi, Lilia Argue, MD  fluocinonide cream ([place]) 0.05 % APPLY SPARINGLY EXTERNALLY TO THE AFFECTED AREA TWICE DAILY Patient not taking: Reported on 05/18/2022 02/02/20   Jacelyn Pi, Lilia Argue, MD  fluticasone [facility]) 50 MCG/ACT nasal spray Place 2 sprays into both nostrils daily. Patient not taking: Reported on 05/18/2022 03/28/22   Roney [name], MD  hydrocortisone (ANUSOL-HC) 2.5 % rectal cream APPLY RECTALLY TO THE AFFECTED AREA TWICE DAILY Patient not taking: Reported on 05/18/2022 03/20/22   Horald Pollen, MD  ibuprofen (ADVIL) 600 MG tablet Take 1 tablet (600 mg total) by mouth every 6 (six) hours as needed. Patient not taking: Reported on 05/18/2022 06/03/20   Melynda Ripple, MD  loratadine (CLARITIN) 10 MG tablet Take 1 tablet (10 mg total) by mouth 2 (two) times daily. Patient not taking: Reported on 05/18/2022 11/02/21   Jiles Prows, MD  montelukast (SINGULAIR) 10 MG tablet TAKE 1 TABLET(10 MG) BY MOUTH AT BEDTIME Patient not taking: Reported on 05/18/2022 08/28/20   Wyn Quaker  P, NP  ondansetron (ZOFRAN) 4 MG tablet Take 1 tablet (4 mg total) by mouth every 8 (eight) hours as needed for nausea or vomiting. Patient not taking: Reported on 05/18/2022 05/09/22 05/09/23  Alexis Frock, MD      Allergies    Patient has no known allergies.    Review of Systems   Review of Systems  Constitutional:  Negative for appetite change and fatigue.  HENT:   Negative for congestion, ear discharge and sinus pressure.   Eyes:  Negative for discharge.  Respiratory:  Negative for cough.   Cardiovascular:  Negative for chest pain.  Gastrointestinal:  Negative for abdominal pain and diarrhea.  Genitourinary:  Positive for flank pain. Negative for frequency and hematuria.  Musculoskeletal:  Negative for back pain.  Skin:  Negative for rash.  Neurological:  Negative for seizures and headaches.  Psychiatric/Behavioral:  Negative for hallucinations.    Physical Exam Updated Vital Signs BP (!) 144/93   Pulse 60   Temp (!) 97.5 F (36.4 C) (Oral)   Resp 17   Ht '5\' 6"'$  (1.676 m)   Wt 83.9 kg   SpO2 98%   BMI 29.86 kg/m  Physical Exam Constitutional:      General: He is in acute distress.     Appearance: He is well-developed.  HENT:     Head: Normocephalic.     Nose: Nose normal.  Eyes:     General: No scleral icterus.    Conjunctiva/sclera: Conjunctivae normal.  Neck:     Thyroid: No thyromegaly.  Cardiovascular:     Rate and Rhythm: Normal rate and regular rhythm.     Heart sounds: No murmur heard.   No friction rub. No gallop.  Pulmonary:     Breath sounds: No stridor. No wheezing or rales.  Chest:     Chest wall: No tenderness.  Abdominal:     General: There is no distension.     Tenderness: There is no abdominal tenderness. There is no rebound.  Genitourinary:    Comments: Tender left flank Musculoskeletal:        General: Normal range of motion.     Cervical back: Neck supple.  Lymphadenopathy:     Cervical: No cervical adenopathy.  Skin:    Findings: No erythema or rash.  Neurological:     Mental Status: He is alert and oriented to person, place, and time.     Motor: No abnormal muscle tone.     Coordination: Coordination normal.  Psychiatric:        Behavior: Behavior normal.    ED Results / Procedures / Treatments   Labs (all labs ordered are listed, but only abnormal results are displayed) Labs Reviewed  CBC  WITH DIFFERENTIAL/PLATELET - Abnormal; Notable for the following components:      Result Value   Lymphs Abs 0.6 (*)    All other components within normal limits  COMPREHENSIVE METABOLIC PANEL - Abnormal; Notable for the following components:   Glucose, Bld 123 (*)    All other components within normal limits  URINALYSIS, ROUTINE W REFLEX MICROSCOPIC - Abnormal; Notable for the following components:   Hgb urine dipstick LARGE (*)    Ketones, ur 5 (*)    RBC / HPF >50 (*)    All other components within normal limits    EKG None  Radiology DG Abdomen 1 View  Result Date: 05/18/2022 CLINICAL DATA:  Left flank pain.  Recent lithotripsy EXAM: ABDOMEN - 1 VIEW COMPARISON:  05/09/2022 FINDINGS: Two small calculi overlying the left lower pole. These likely are fragmented pieces of the slightly larger stone seen on the prior study. No ureteral stone. No right renal calculi Phleboliths in left pelvis unchanged. IMPRESSION: Left renal calculus appears fragmented, now overlying the left lower pole. No ureteral calculus identified. Electronically Signed   By: Franchot Gallo M.D.   On: 05/18/2022 13:28    Procedures Procedures    Medications Ordered in ED Medications  ketorolac (TORADOL) 30 MG/ML injection 30 mg (30 mg Intravenous Given 05/18/22 1542)  ondansetron (ZOFRAN) injection 4 mg (4 mg Intravenous Given 05/18/22 1540)  ondansetron (ZOFRAN) injection 4 mg (4 mg Intravenous Given 05/18/22 1843)  ketorolac (TORADOL) 30 MG/ML injection 15 mg (15 mg Intravenous Given 05/18/22 1843)  oxyCODONE-acetaminophen (PERCOCET/ROXICET) 5-325 MG per tablet 1 tablet (1 tablet Oral Given 05/18/22 1948)    ED Course/ Medical Decision Making/ A&P                           Medical Decision Making Amount and/or Complexity of Data Reviewed Labs: ordered.  Risk Prescription drug management.  This patient presents to the ED for concern of flank pain after lithotripsy, this involves an extensive number of  treatment options, and is a complaint that carries with it a high risk of complications and morbidity.  The differential diagnosis includes UTI, kidney stones causing ureteral pain   Co morbidities that complicate the patient evaluation  Hypertension and kidney stone   Additional history obtained:  Additional history obtained from the patient and relative External records from outside source obtained and reviewed including hospital record   Lab Tests:  I Ordered, and personally interpreted labs.  The pertinent results include: CBC and chemistries and urinalysis unremarkable except for blood in urine   Imaging Studies ordered:  No imaging Cardiac Monitoring: / EKG:  The patient was maintained on a cardiac monitor.  I personally viewed and interpreted the cardiac monitored which showed an underlying rhythm of: Normal sinus rhythm   Consultations Obtained:  No consult  Problem List / ED Course / Critical interventions / Medication management  Flank pain I ordered medication including Toradol for pain Reevaluation of the patient after these medicines showed that the patient improved I have reviewed the patients home medicines and have made adjustments as needed   Social Determinants of Health:  None   Test / Admission - Considered:  CT renal.  But patient does not require admission  Patient with flank pain secondary to kidney stones that were broken up with lithotripsy.  He is discharged home with Percocets and Zofran and will follow-up with urology        Final Clinical Impression(s) / ED Diagnoses Final diagnoses:  Flank pain    Rx / DC Orders ED Discharge Orders          Ordered    oxyCODONE-acetaminophen (PERCOCET) 5-325 MG tablet  Every 6 hours PRN        05/18/22 2001    ondansetron (ZOFRAN-ODT) 4 MG disintegrating tablet        05/18/22 Jenita Seashore, MD 05/21/22 1119

## 2022-05-18 NOTE — ED Triage Notes (Signed)
Pt reports left flank pain that has progressively worsened since lithotripsy last week. Pt has hx of kidney stones.

## 2022-05-18 NOTE — ED Triage Notes (Signed)
Patient reports left flank pain.  Has had pain, mild recently.  Today,  pain is significant.  5/15 patient had lithotripsy.  Patient has had 2 different reports of number of calculi.  Reportedly one stone and lithotripsy completed on that stone.  Patient having testicular pain

## 2022-05-18 NOTE — Discharge Instructions (Signed)
Follow-up with your urologist next week as planned.  Call them if any problem

## 2022-05-18 NOTE — ED Notes (Signed)
Patient is being discharged from the Urgent Care and sent to the Emergency Department via pov . Per Verna Czech, PA, patient is in need of higher level of care due to worsening of symptoms. Patient is aware and verbalizes understanding of plan of care.  Vitals:   05/18/22 1237  BP: 135/85  Pulse: 96  Resp: (!) 24  Temp: 98 F (36.7 C)  SpO2: 96%

## 2022-05-19 LAB — URINE CULTURE: Culture: NO GROWTH

## 2022-05-21 ENCOUNTER — Emergency Department (HOSPITAL_COMMUNITY): Payer: BC Managed Care – PPO

## 2022-05-21 ENCOUNTER — Other Ambulatory Visit: Payer: Self-pay

## 2022-05-21 ENCOUNTER — Encounter (HOSPITAL_COMMUNITY): Payer: Self-pay

## 2022-05-21 ENCOUNTER — Inpatient Hospital Stay (HOSPITAL_COMMUNITY)
Admission: EM | Admit: 2022-05-21 | Discharge: 2022-05-24 | DRG: 660 | Disposition: A | Discharge status: Home / Self Care | Payer: BC Managed Care – PPO | Attending: Internal Medicine | Admitting: Internal Medicine

## 2022-05-21 DIAGNOSIS — N401 Enlarged prostate with lower urinary tract symptoms: Secondary | ICD-10-CM | POA: Diagnosis present

## 2022-05-21 DIAGNOSIS — N139 Obstructive and reflux uropathy, unspecified: Secondary | ICD-10-CM

## 2022-05-21 DIAGNOSIS — N179 Acute kidney failure, unspecified: Secondary | ICD-10-CM

## 2022-05-21 DIAGNOSIS — Z825 Family history of asthma and other chronic lower respiratory diseases: Secondary | ICD-10-CM

## 2022-05-21 DIAGNOSIS — Z8546 Personal history of malignant neoplasm of prostate: Secondary | ICD-10-CM

## 2022-05-21 DIAGNOSIS — I10 Essential (primary) hypertension: Secondary | ICD-10-CM | POA: Diagnosis present

## 2022-05-21 DIAGNOSIS — R7303 Prediabetes: Secondary | ICD-10-CM | POA: Diagnosis present

## 2022-05-21 DIAGNOSIS — K219 Gastro-esophageal reflux disease without esophagitis: Secondary | ICD-10-CM | POA: Diagnosis present

## 2022-05-21 DIAGNOSIS — R52 Pain, unspecified: Secondary | ICD-10-CM | POA: Diagnosis not present

## 2022-05-21 DIAGNOSIS — N200 Calculus of kidney: Secondary | ICD-10-CM

## 2022-05-21 DIAGNOSIS — E1159 Type 2 diabetes mellitus with other circulatory complications: Secondary | ICD-10-CM | POA: Diagnosis present

## 2022-05-21 DIAGNOSIS — J4541 Moderate persistent asthma with (acute) exacerbation: Secondary | ICD-10-CM | POA: Diagnosis present

## 2022-05-21 DIAGNOSIS — N132 Hydronephrosis with renal and ureteral calculous obstruction: Principal | ICD-10-CM | POA: Diagnosis present

## 2022-05-21 DIAGNOSIS — Z79899 Other long term (current) drug therapy: Secondary | ICD-10-CM

## 2022-05-21 DIAGNOSIS — Z7951 Long term (current) use of inhaled steroids: Secondary | ICD-10-CM

## 2022-05-21 DIAGNOSIS — J4530 Mild persistent asthma, uncomplicated: Secondary | ICD-10-CM | POA: Diagnosis present

## 2022-05-21 DIAGNOSIS — N138 Other obstructive and reflux uropathy: Secondary | ICD-10-CM | POA: Diagnosis present

## 2022-05-21 DIAGNOSIS — G4730 Sleep apnea, unspecified: Secondary | ICD-10-CM | POA: Diagnosis present

## 2022-05-21 DIAGNOSIS — E785 Hyperlipidemia, unspecified: Secondary | ICD-10-CM

## 2022-05-21 LAB — BASIC METABOLIC PANEL
Anion gap: 9 (ref 5–15)
BUN: 19 mg/dL (ref 6–20)
CO2: 21 mmol/L — ABNORMAL LOW (ref 22–32)
Calcium: 9 mg/dL (ref 8.9–10.3)
Chloride: 110 mmol/L (ref 98–111)
Creatinine, Ser: 1.53 mg/dL — ABNORMAL HIGH (ref 0.61–1.24)
GFR, Estimated: 52 mL/min — ABNORMAL LOW (ref >60)
Glucose, Bld: 185 mg/dL — ABNORMAL HIGH (ref 70–99)
Potassium: 4 mmol/L (ref 3.5–5.1)
Sodium: 140 mmol/L (ref 135–145)

## 2022-05-21 LAB — CBC
HCT: 45.4 % (ref 39.0–52.0)
Hemoglobin: 15.7 g/dL (ref 13.0–17.0)
MCH: 30.6 pg (ref 26.0–34.0)
MCHC: 34.6 g/dL (ref 30.0–36.0)
MCV: 88.5 fL (ref 80.0–100.0)
Platelets: 226 10*3/uL (ref 150–400)
RBC: 5.13 MIL/uL (ref 4.22–5.81)
RDW: 12.1 % (ref 11.5–15.5)
WBC: 8.9 10*3/uL (ref 4.0–10.5)
nRBC: 0 % (ref 0.0–0.2)

## 2022-05-21 LAB — URINALYSIS, ROUTINE W REFLEX MICROSCOPIC
Bacteria, UA: NONE SEEN
Bilirubin Urine: NEGATIVE
Glucose, UA: NEGATIVE mg/dL
Ketones, ur: NEGATIVE mg/dL
Leukocytes,Ua: NEGATIVE
Nitrite: NEGATIVE
Protein, ur: NEGATIVE mg/dL
Specific Gravity, Urine: 1.011 (ref 1.005–1.030)
pH: 8 (ref 5.0–8.0)

## 2022-05-21 IMAGING — CT CT RENAL STONE PROTOCOL
2 of 4 series · 15 of 46 positions shown, 17 images · non-contrast
Comparison: [DATE].

CLINICAL DATA: Nephrolithiasis, back pain.



[Series 2: axial st · axial · 0.84mm/px · z∈[+981,+1436]mm · 12 of 105 slices shown, 14 images]
[im 7/105  soft-tissue]
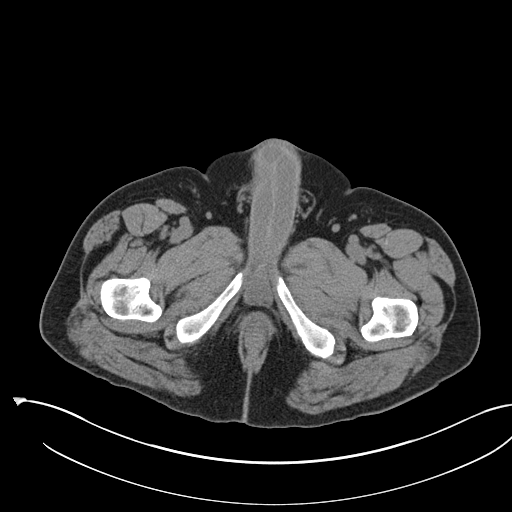
[im 7/105  bone]
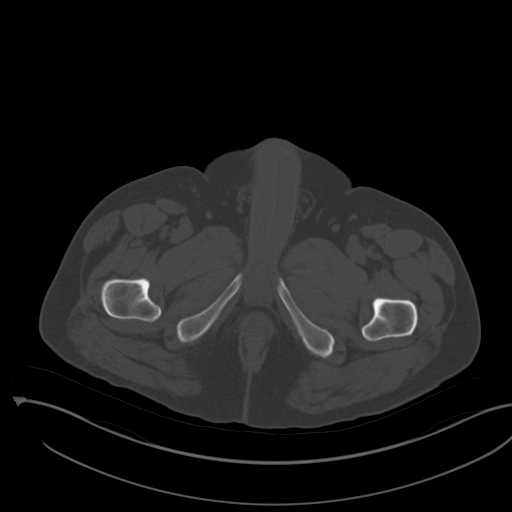
[im 14/105  soft-tissue]
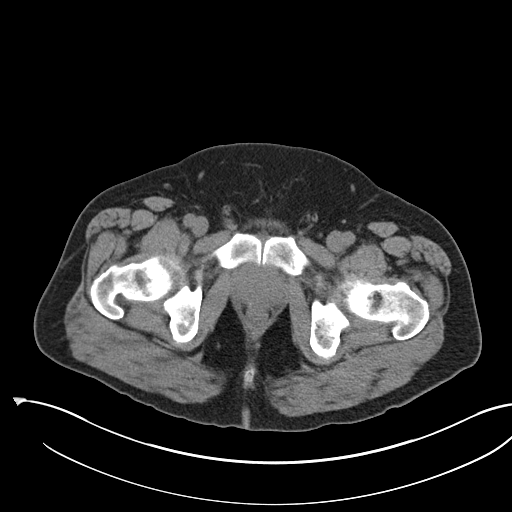
[im 27/105  soft-tissue]
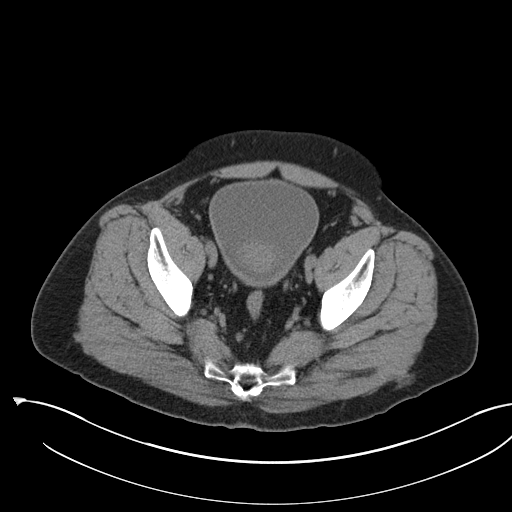
[im 33/105  soft-tissue]
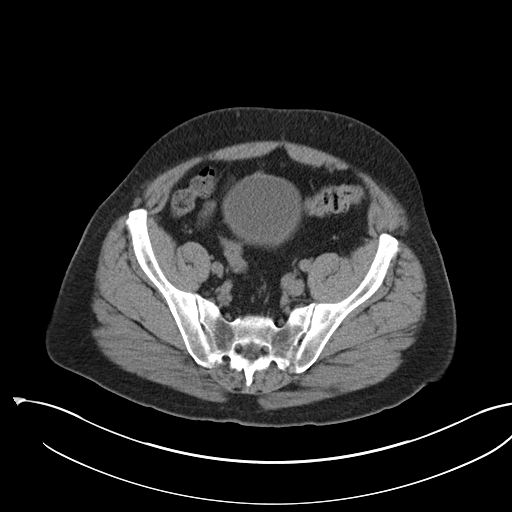
[im 40/105  soft-tissue]
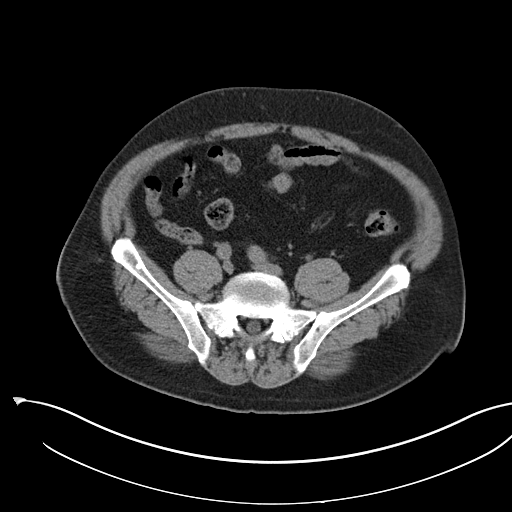
[im 46/105  soft-tissue]
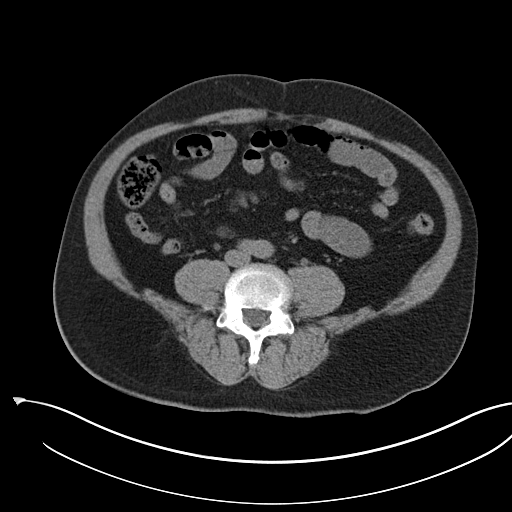
[im 59/105  soft-tissue]
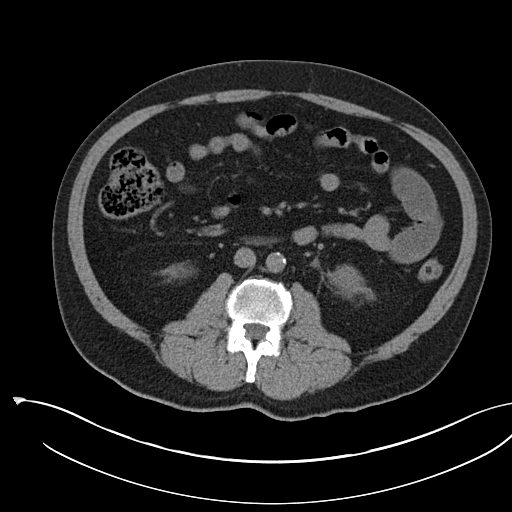
[im 66/105  soft-tissue]
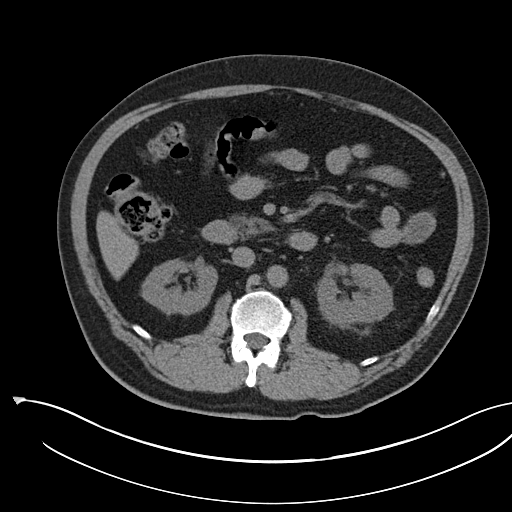
[im 72/105  soft-tissue]
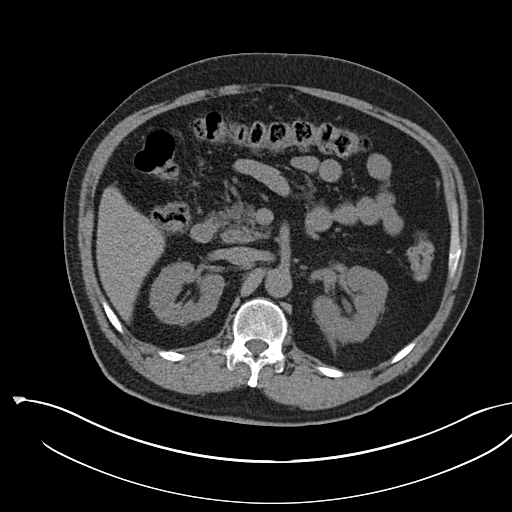
[im 72/105  bone]
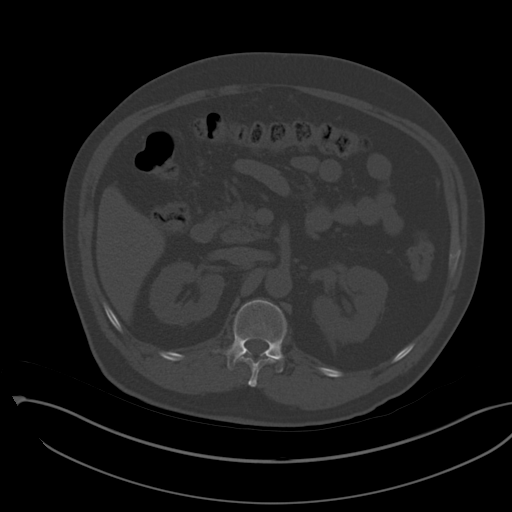
[im 79/105  soft-tissue]
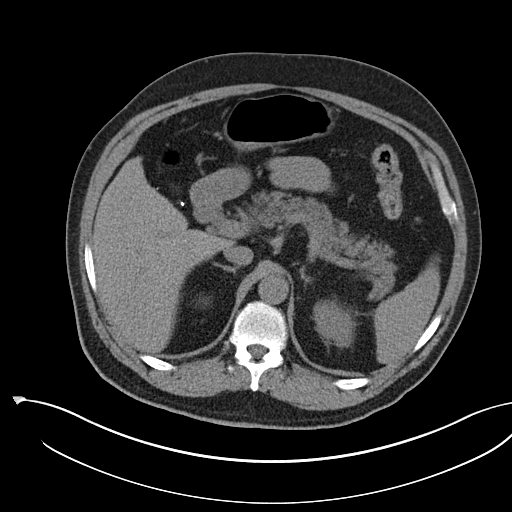
[im 92/105  soft-tissue]
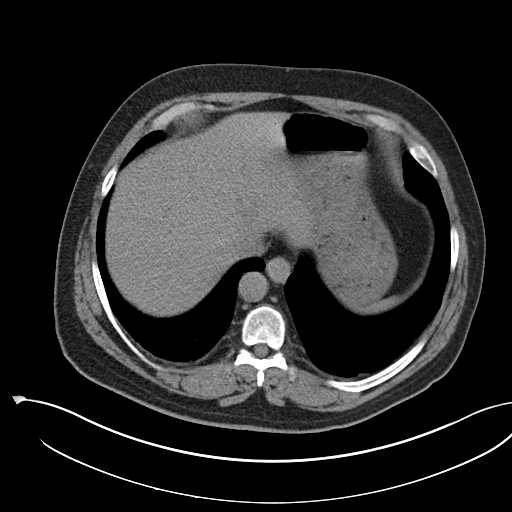
[im 98/105  soft-tissue]
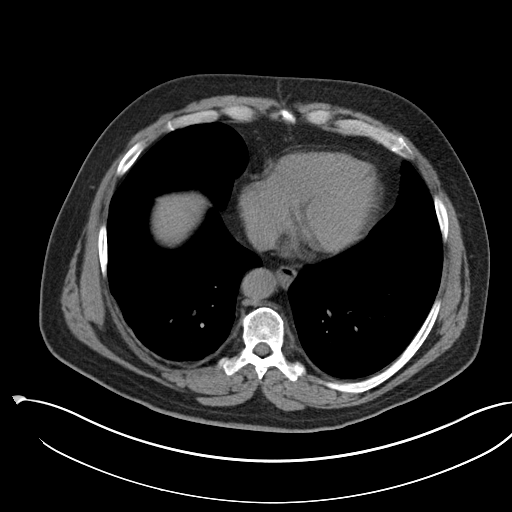

[Series 5: coronal · coronal · 0.78mm/px · 3 of 161 slices shown]
[im 54/161  soft-tissue]
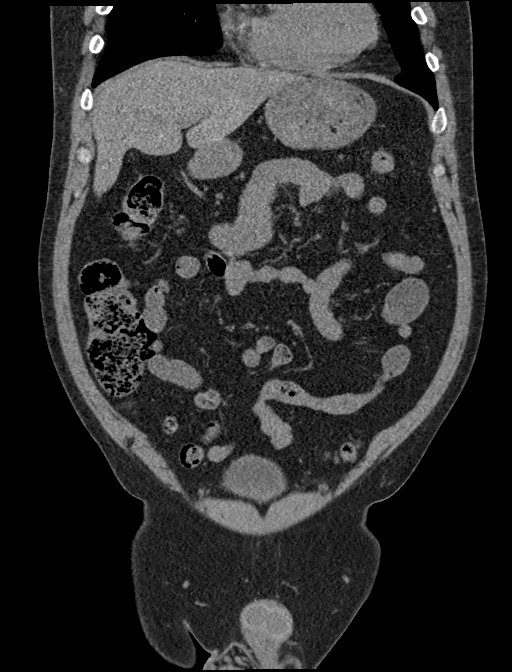
[im 72/161  soft-tissue]
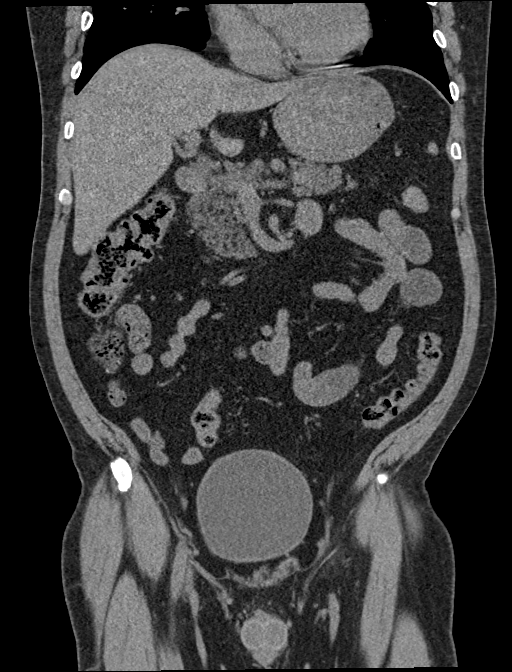
[im 89/161  soft-tissue]
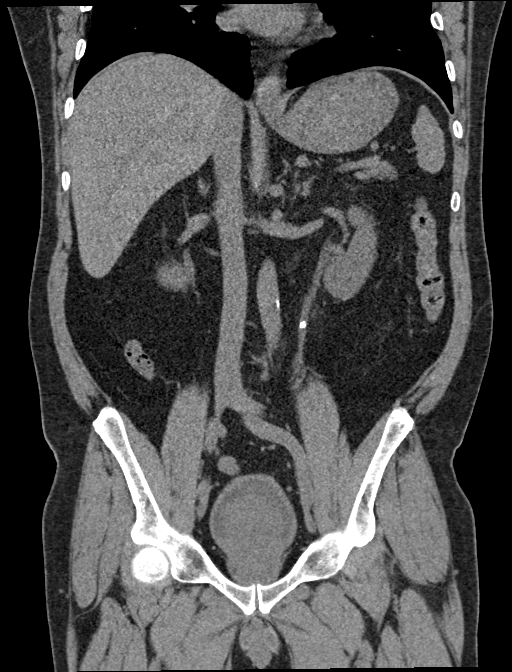

[15 of 46 positions shown; findings below may reference images not displayed]

FINDINGS: Lower chest: Scattered coronary artery calcifications are noted.
Dependent atelectasis is noted bilaterally. A stable 4 mm nodule is
present in the left lower lobe, axial image 38.

Hepatobiliary: No focal liver abnormality is seen. Status post
cholecystectomy. No biliary dilatation.

Pancreas: Unremarkable. No pancreatic ductal dilatation or
surrounding inflammatory changes.

Spleen: Normal in size without focal abnormality.

Adrenals/Urinary Tract: The adrenal glands are within normal limits.
A nonobstructive calculus is present in the lower pole the left
kidney. A 4 x 6 mm stone is present in the mid left ureter resulting
in mild hydronephrosis. Perinephric and periureteral fat stranding
are noted on the left. No obstructive uropathy on the right. Bladder
is unremarkable.

Stomach/Bowel: Stomach is within normal limits. Appendix is
surgically absent. No evidence of bowel wall thickening, distention,
or inflammatory changes. No free air or pneumatosis.

Vascular/Lymphatic: Aortic atherosclerosis. No enlarged abdominal or
pelvic lymph nodes.

Reproductive: The prostate gland is enlarged with a nodular contour
exerting mass effect on the base of the urinary bladder.

Other: Small fat containing inguinal hernias bilaterally. No
ascites.

Musculoskeletal: Mild degenerative changes in the thoracolumbar
spine. No acute osseous abnormality.
IMPRESSION: 1. Mild obstructive uropathy on the left with a 6 x 4 mm calculus in
the mid left ureter.
2. Nonobstructive left renal calculus.
3. Enlarged prostate gland.
4. Aortic atherosclerosis.
5. Stable 4 mm nodule in the left lower lobe. No follow-up
recommended. This recommendation follows the consensus statement:
Guidelines for Management of Incidental Pulmonary Nodules Detected

## 2022-05-21 MED ORDER — LORAZEPAM 2 MG/ML IJ SOLN
1.0000 mg | Freq: Once | INTRAMUSCULAR | Status: AC
  Administered 2022-05-21: 1 mg via INTRAVENOUS
  Filled 2022-05-21: qty 1

## 2022-05-21 MED ORDER — TAMSULOSIN HCL 0.4 MG PO CAPS
0.4000 mg | ORAL_CAPSULE | Freq: Every day | ORAL | Status: DC
  Administered 2022-05-21 – 2022-05-23 (×3): 0.4 mg via ORAL
  Filled 2022-05-21 (×3): qty 1

## 2022-05-21 MED ORDER — HYDROMORPHONE HCL 1 MG/ML IJ SOLN
1.0000 mg | Freq: Once | INTRAMUSCULAR | Status: AC
Start: 2022-05-21 — End: 2022-05-21
  Administered 2022-05-21: 1 mg via INTRAVENOUS
  Filled 2022-05-21: qty 1

## 2022-05-21 MED ORDER — LACTATED RINGERS IV SOLN: INTRAVENOUS | Status: DC

## 2022-05-21 MED ORDER — ONDANSETRON HCL 4 MG/2ML IJ SOLN
4.0000 mg | Freq: Once | INTRAMUSCULAR | Status: AC
Start: 2022-05-21 — End: 2022-05-21
  Administered 2022-05-21: 4 mg via INTRAVENOUS
  Filled 2022-05-21: qty 2

## 2022-05-21 MED ORDER — KETOROLAC TROMETHAMINE 15 MG/ML IJ SOLN
15.0000 mg | Freq: Once | INTRAMUSCULAR | Status: AC
  Administered 2022-05-21: 15 mg via INTRAVENOUS
  Filled 2022-05-21: qty 1

## 2022-05-21 MED ORDER — ONDANSETRON HCL 4 MG/2ML IJ SOLN
4.0000 mg | Freq: Once | INTRAMUSCULAR | Status: AC
  Administered 2022-05-21: 4 mg via INTRAVENOUS
  Filled 2022-05-21: qty 2

## 2022-05-21 MED ORDER — HYDROMORPHONE HCL 1 MG/ML IJ SOLN
1.0000 mg | Freq: Once | INTRAMUSCULAR | Status: AC
  Administered 2022-05-21: 1 mg via INTRAVENOUS
  Filled 2022-05-21: qty 1

## 2022-05-21 MED ORDER — PROCHLORPERAZINE EDISYLATE 10 MG/2ML IJ SOLN
10.0000 mg | Freq: Four times a day (QID) | INTRAMUSCULAR | Status: DC | PRN
  Administered 2022-05-22 (×2): 10 mg via INTRAVENOUS
  Filled 2022-05-21 (×2): qty 2

## 2022-05-21 MED ORDER — HYDROMORPHONE HCL 1 MG/ML IJ SOLN
1.0000 mg | INTRAMUSCULAR | Status: DC | PRN
  Administered 2022-05-21 – 2022-05-22 (×3): 1 mg via INTRAVENOUS
  Filled 2022-05-21 (×3): qty 1

## 2022-05-21 MED ORDER — PROCHLORPERAZINE EDISYLATE 10 MG/2ML IJ SOLN
10.0000 mg | Freq: Once | INTRAMUSCULAR | Status: AC
  Administered 2022-05-21: 10 mg via INTRAVENOUS
  Filled 2022-05-21: qty 2

## 2022-05-21 MED ORDER — SODIUM CHLORIDE 0.9 % IV BOLUS
1000.0000 mL | Freq: Once | INTRAVENOUS | Status: AC
  Administered 2022-05-21: 1000 mL via INTRAVENOUS

## 2022-05-21 NOTE — ED Provider Notes (Signed)
Patient presented overnight with flank pain secondary to kidney stone 6 x 4 mm in size with moderate hydronephrosis.  Treatment included multiple doses of IV Dilaudid multiple doses of Toradol Zofran and IV fluids.  Patient's pain appears still poorly controlled although he is very sleepy appearing due to sedatives and narcotics.  Consultation with urology.  Consultation with medicine for admission for pain management of kidney stones.    Luna Fuse, MD 05/21/22 1121

## 2022-05-21 NOTE — Consult Note (Signed)
Consultation: Left ureteral stone, flank pain Requested by: Dr. Thamas Jaegers  History of Present Illness: Rickey Cruz is a 61 year old male who is followed by Dr. Claudia Desanctis for kidney stone, BPH and prostate cancer.  He is on active surveillance.  He had some left flank pain and a 7 mm left lower pole stone was noted.  He underwent left shockwave lithotripsy of the lower pole stone May 09, 2022.  A big part of the decision to do shockwave because he has a 90 to 120 g prostate on imaging with a very large median lobe.  Retrograde access may be difficult.  He has had flank pain with a KUB 05/18/2022 showing the stone fragmented with a 5 mm left proximal ureteral fragment/stone.  A small left lower pole fragment.  He saw Dr. Claudia Desanctis yesterday in the office on 05/20/2022 when his pain was improved.  He then presented to emergency earlier today with left flank pain.  CT scan of the abdomen and pelvis today revealed the proximal 5 mm left stone/fragment and a left lower pole stone/fragment at about 4 mm.  His pain is improved but he is somnolent and was admitted for observation.  It does not appear he has been on tamsulosin.  He has been afebrile with a normal white count.  Creatinine slightly elevated at 1.5.  Again he has a history of BPH with a 90 to 120 g prostate on imaging with a large median lobe. On AS for prostate cancer for GG1 disease.   Past Medical History:  Diagnosis Date   Allergy    Flonase, Patanol, Zyrtec   Arthritis    psoriasis   Asthma    seasonal, with allergies   Borderline diabetes    Cluster headache    Colon polyp 10/27/2011   Depression    denies 11/11/16; denies 11/21/18 "been years"   Eczema    GERD (gastroesophageal reflux disease)    History of kidney stones    x1   Hyperlipidemia    Hypertension    Low back pain    "in kidney area-was told that it was related to gallstones"   Prediabetes    Prostate cancer ([place])    Psoriasis    [name]   Severe sleep apnea     h/o; had procedure and weight control, does not have to wear CPAP   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 Repeat 5 years.   EXTRACORPOREAL SHOCK WAVE LITHOTRIPSY Left 05/09/2022   Procedure: EXTRACORPOREAL SHOCK WAVE LITHOTRIPSY (ESWL);  Surgeon: Alexis Frock, MD;  [name];  Service: Urology;  Laterality: Left;   POLYPECTOMY     PROSTATE BIOPSY  10/2014   will have another in 3 months   SEPTOPLASTY  2014   TONSILLECTOMY  2014   UVULOPALATOPHARYNGOPLASTY  2014    Home Medications:  (Not in a hospital admission)  Allergies: No Known Allergies  Family History  Problem Relation Age of Onset   Stroke Mother    Asthma Sister    Colon cancer Neg Hx    Esophageal cancer Neg Hx    Stomach cancer Neg Hx    Prostate cancer Neg Hx  Diabetes Neg Hx    CAD Neg Hx    Migraines Neg Hx    Headache Neg Hx    Social History:  reports that he has never smoked. He has never used smokeless tobacco. He reports that he does not drink alcohol and does not use drugs.  ROS: A complete review of systems was performed.  All systems are negative except for pertinent findings as noted. Review of Systems  All other systems reviewed and are negative.   Physical Exam:  Vital signs in last 24 hours: Temp:  [97.7 F (36.5 C)] 97.7 F (36.5 C) (05/27 0153) Pulse Rate:  [55-111] 58 (05/27 1200) Resp:  [10-22] 10 (05/27 1200) BP: (95-150)/(69-104) 110/83 (05/27 1200) SpO2:  [88 %-97 %] 97 % (05/27 1200) Weight:  [83.9 kg] 83.9 kg (05/27 0153) General:  Alert and oriented, No acute distress HEENT: Normocephalic, atraumatic Cardiovascular: Regular  rate and rhythm Lungs: Regular rate and effort Abdomen: Soft, nontender, nondistended, no abdominal masses Back: No CVA tenderness Extremities: No edema Neurologic: Grossly intact  Laboratory Data:  Results for orders placed or performed during the hospital encounter of 05/21/22 (from the past 24 hour(s))  CBC     Status: None   Collection Time: 05/21/22  2:40 AM  Result Value Ref Range   WBC 8.9 4.0 - 10.5 K/uL   RBC 5.13 4.22 - 5.81 MIL/uL   Hemoglobin 15.7 13.0 - 17.0 g/dL   HCT 45.4 39.0 - 52.0 %   MCV 88.5 80.0 - 100.0 fL   MCH 30.6 26.0 - 34.0 pg   MCHC 34.6 30.0 - 36.0 g/dL   RDW 12.1 11.5 - 15.5 %   Platelets 226 150 - 400 K/uL   nRBC 0.0 0.0 - 0.2 %  Basic metabolic panel     Status: Abnormal   Collection Time: 05/21/22  2:40 AM  Result Value Ref Range   Sodium 140 135 - 145 mmol/L   Potassium 4.0 3.5 - 5.1 mmol/L   Chloride 110 98 - 111 mmol/L   CO2 21 (L) 22 - 32 mmol/L   Glucose, Bld 185 (H) 70 - 99 mg/dL   BUN 19 6 - 20 mg/dL   Creatinine, Ser 1.53 (H) 0.61 - 1.24 mg/dL   Calcium 9.0 8.9 - 10.3 mg/dL   GFR, Estimated 52 (L) >60 mL/min   Anion gap 9 5 - 15  Urinalysis, Routine w reflex microscopic     Status: Abnormal   Collection Time: 05/21/22  4:58 AM  Result Value Ref Range   Color, Urine YELLOW YELLOW   APPearance HAZY (A) CLEAR   Specific Gravity, Urine 1.011 1.005 - 1.030   pH 8.0 5.0 - 8.0   Glucose, UA NEGATIVE NEGATIVE mg/dL   Hgb urine dipstick SMALL (A) NEGATIVE   Bilirubin Urine NEGATIVE NEGATIVE   Ketones, ur NEGATIVE NEGATIVE mg/dL   Protein, ur NEGATIVE NEGATIVE mg/dL   Nitrite NEGATIVE NEGATIVE   Leukocytes,Ua NEGATIVE NEGATIVE   RBC / HPF 21-50 0 - 5 RBC/hpf   WBC, UA 0-5 0 - 5 WBC/hpf   Bacteria, UA NONE SEEN NONE SEEN   Mucus PRESENT    Hyaline Casts, UA PRESENT    Amorphous Crystal PRESENT    Recent Results (from the past 240 hour(s))  Urine Culture     Status: None   Collection Time: 05/18/22  2:00 PM   Specimen: Urine,  Clean Catch  Result Value Ref Range Status   Specimen Description URINE, CLEAN CATCH  Final   Special Requests NONE  Final   Culture   Final    NO GROWTH Performed at [facility], [place] [address], [place], [place] [address]    Report Status 05/19/2022 FINAL  Final   Creatinine: Recent Labs    05/18/22 1254 05/18/22 1512 05/21/22 0240  CREATININE 1.02 1.04 1.53*    Impression/Assessment/plan:  Left flank pain with left renal stone and left ureteral stone-  He is in a tough situation.  There is not a lot of hydro so a nephrostomy tube is not a good option and the stones are small. It may be difficult to access the left ureter retrograde without a TURP.  Discussed this with the patient and his wife the nature r/b/a to cystoscopy, left RGP, left URS/HLL/stent with possible TURP. Discussed failure to gain RG access or staged procedure may be needed. As his pain has improved he elects to continue stone passage. I started tamsulosin. Continue pain control, IV fluids. We will keep n.p.o. after midnight with consideration of procedure tomorrow.  Hopefully his pain will improve and he could potentially be set up for shockwave lithotripsy of the left proximal stone/fragment as an outpatient.   Festus Aloe 05/21/2022

## 2022-05-21 NOTE — ED Notes (Addendum)
Pt had an output of 240cc's of emesis (Consisted of orange juice).

## 2022-05-21 NOTE — H&P (Signed)
History and Physical    Patient: Rickey Cruz FGH:[id number] DOB: January 30, 1961 DOA: 05/21/2022 DOS: the patient was seen and examined on 05/21/2022 PCP: Horald Pollen, MD  Patient coming from: Home  Chief Complaint:  Chief Complaint  Patient presents with   Flank Pain   HPI: Rickey Cruz is a 60 y.o. male with medical history significant of HTN, HLD< asthma. Presenting with left flank pain. He has a history of left non-obstruction kidney stones. He has been followed by urology. He had a shockwave lithotripsy procedure performed on 05/09/22. He reports that 3 days ago, he had acute N/V and left flank pain. He went to urgent care and was told he had some retained stones. He was sent to the ED. There he improved and was given percocet and zofran. He was discharged to home. He reports that his pain returned and was more intense over the last day or so. It has not been relieved with percocet. He has had N/V during this time. He has not had fever, diarrhea, dysuria. He has not noticed any stones in his urine. When his symptoms didn't improve last night, he decided to come to the ED for assistance. He denies any other aggravating or alleviating factors.    Review of Systems: As mentioned in the history of present illness. All other systems reviewed and are negative. Past Medical History:  Diagnosis Date   Allergy    Flonase, Patanol, Zyrtec   Arthritis    psoriasis   Asthma    seasonal, with allergies   Borderline diabetes    Cluster headache    Colon polyp 10/27/2011   Depression    denies 11/11/16; denies 11/21/18 "been years"   Eczema    GERD (gastroesophageal reflux disease)    History of kidney stones    x1   Hyperlipidemia    Hypertension    Low back pain    "in kidney area-was told that it was related to gallstones"   Prediabetes    Prostate cancer ([place])    Psoriasis    [name]   Severe sleep apnea    h/o; had procedure and weight control, does not have to  wear CPAP   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 Repeat 5 years.   EXTRACORPOREAL SHOCK WAVE LITHOTRIPSY Left 05/09/2022   Procedure: EXTRACORPOREAL SHOCK WAVE LITHOTRIPSY (ESWL);  Surgeon: Alexis Frock, MD;  Location: [facility];  Service: Urology;  Laterality: Left;   POLYPECTOMY     PROSTATE BIOPSY  10/2014   will have another in 3 months   SEPTOPLASTY  2014   TONSILLECTOMY  2014   UVULOPALATOPHARYNGOPLASTY  2014   Social History:  reports that he has never smoked. He has never used smokeless tobacco. He reports that he does not drink alcohol and does not use drugs.  No Known Allergies  Family History  Problem Relation Age of Onset   Stroke Mother    Asthma Sister    Colon cancer Neg Hx    Esophageal cancer Neg Hx    Stomach cancer Neg Hx    Prostate cancer Neg Hx    Diabetes Neg Hx    CAD  Neg Hx    Migraines Neg Hx    Headache Neg Hx     Prior to Admission medications   Medication Sig Start Date End Date Taking? Authorizing Provider  albuterol (VENTOLIN HFA) 108 (90 Base) MCG/ACT inhaler INHALE 2 PUFFS INTO THE LUNGS EVERY 4 HOURS AS NEEDED FOR WHEEZING OR SHORTNESS OF BREATH(COUGHING FITS) 12/21/21  Yes Kozlow, Donnamarie Poag, MD  amLODipine (NORVASC) 5 MG tablet TAKE 1 TABLET(5 MG) BY MOUTH DAILY 12/21/21  Yes Sagardia, Ines Bloomer, MD  Budeson-Glycopyrrol-Formoterol (BREZTRI AEROSPHERE) 160-9-4.8 MCG/ACT AERO Inhale two puffs with spacer twice daily to prevent cough or wheeze.  Rinse, gargle, and spit after use. Patient taking differently: Inhale 2 puffs into the lungs 2 (two) times daily as needed (cough or wheezing). Rinse, gargle, and  spit after use. 04/27/22  Yes Kozlow, Donnamarie Poag, MD  lisinopril (ZESTRIL) 40 MG tablet Take 1 tablet (40 mg total) by mouth daily. 07/07/21  Yes Sagardia, Ines Bloomer, MD  ondansetron (ZOFRAN-ODT) 4 MG disintegrating tablet '4mg'$  ODT q4 hours prn nausea/vomit Patient taking differently: Take 4 mg by mouth every 4 (four) hours as needed for nausea or vomiting. 05/18/22  Yes Milton Ferguson, MD  oxyCODONE-acetaminophen (PERCOCET/ROXICET) 5-325 MG tablet Take 1 tablet by mouth every 6 (six) hours as needed for severe pain. 05/18/22  Yes [provider]  rosuvastatin (CRESTOR) 10 MG tablet TAKE 1 TABLET(10 MG) BY MOUTH DAILY 02/14/22  Yes Sagardia, Ines Bloomer, MD  clobetasol (OLUX) 0.05 % topical foam APPLY EXTERNALLY TO THE AFFECTED AREA TWICE DAILY Patient not taking: Reported on 05/21/2022 03/31/20   Jacelyn Pi, Lilia Argue, MD  clobetasol ointment (TEMOVATE) 0.05 % APPLY EXTERNALLY TO THE AFFECTED AREA TWICE DAILY Patient not taking: Reported on 05/18/2022 04/17/20   Jacelyn Pi, Lilia Argue, MD  fluocinolone (VANOS) 0.01 % cream Apply topically daily. Patient not taking: Reported on 05/18/2022 12/06/19   Jacelyn Pi, Lilia Argue, MD  fluocinonide cream ([place]) 0.05 % APPLY SPARINGLY EXTERNALLY TO THE AFFECTED AREA TWICE DAILY Patient not taking: Reported on 05/18/2022 02/02/20   Jacelyn Pi, Lilia Argue, MD  fluticasone [facility]) 50 MCG/ACT nasal spray Place 2 sprays into both nostrils daily. Patient not taking: Reported on 05/18/2022 03/28/22   Roney [name], MD  HYDROcodone-acetaminophen (NORCO/VICODIN) 5-325 MG tablet Take 1 tablet by mouth every 6 (six) hours as needed for moderate pain. Patient not taking: Reported on 05/21/2022 05/18/22   Milton Ferguson, MD  hydrocortisone (ANUSOL-HC) 2.5 % rectal cream APPLY RECTALLY TO THE AFFECTED AREA TWICE DAILY Patient not taking: Reported on 05/21/2022 03/20/22   Horald Pollen, MD  ibuprofen (ADVIL) 600 MG tablet Take 1 tablet (600 mg total) by mouth every 6 (six)  hours as needed. Patient not taking: Reported on 05/18/2022 06/03/20   Melynda Ripple, MD  loratadine (CLARITIN) 10 MG tablet Take 1 tablet (10 mg total) by mouth 2 (two) times daily. Patient not taking: Reported on 05/18/2022 11/02/21   Jiles Prows, MD  montelukast (SINGULAIR) 10 MG tablet TAKE 1 TABLET(10 MG) BY MOUTH AT BEDTIME Patient not taking: Reported on 05/18/2022 08/28/20   Lauraine Rinne, NP    Physical Exam: Vitals:   05/21/22 0945 05/21/22 1000 05/21/22 1034 05/21/22 1115  BP: (!) 132/93 (!) 150/104 125/90 123/90  Pulse: 72 (!) 57 63 66  Resp:   12 12  Temp:      TempSrc:      SpO2: 97% 97% 95% 97%  Weight:      Height:  General: 61 y.o. male resting in bed in NAD Eyes: PERRL, normal sclera ENMT: Nares patent w/o discharge, orophaynx clear, dentition normal, ears w/o discharge/lesions/ulcers Neck: Supple, trachea midline Cardiovascular: RRR, +S1, S2, no m/g/r, equal pulses throughout Respiratory: CTABL, no w/r/r, normal WOB GI: BS+, NDNT, no masses noted, no organomegaly noted MSK: No e/c/c, LCVAT Neuro: A&O x 3, no focal deficits Psyc: Appropriate interaction and affect, calm/cooperative  Data Reviewed:  Na+  140 CO2  21 Glucose  185 Scr  1.53 WBC  8.9 Hgb  15.7  CT Renal Stone Study 1. Mild obstructive uropathy on the left with a 6 x 4 mm calculus in the mid left ureter. 2. Nonobstructive left renal calculus. 3. Enlarged prostate gland. 4. Aortic atherosclerosis. 5. Stable 4 mm nodule in the left lower lobe. No follow-up recommended. This recommendation follows the consensus statement: Guidelines for Management of Incidental Pulmonary Nodules Detected on CT Images: From the Fleischner Society 2017; Radiology 2017; 284:228-243.  Assessment and Plan: Mild obstruction uropathy AKI     - place in med-surg obs     - urology to see, keep NPO for now, appreciate assistance     - fluids, anti-emetics, pain control     - strain all urine  Intractable  pain secondary to above     - PRN pain control  HTN     - normotensive right now; resume home regimen when off NPO status  HLD     - resume home regimen when off NPO status  Asthma     - continue home regimen  Pre-diabetes     - last A1c was 01/12/22 (6.0); glucose is elevated; rpt A1c  Advance Care Planning:   Code Status: FULL  Consults: Urology  Family Communication: w/ family at bedside  Severity of Illness: The appropriate patient status for this patient is OBSERVATION. Observation status is judged to be reasonable and necessary in order to provide the required intensity of service to ensure the patient's safety. The patient's presenting symptoms, physical exam findings, and initial radiographic and laboratory data in the context of their medical condition is felt to place them at decreased risk for further clinical deterioration. Furthermore, it is anticipated that the patient will be medically stable for discharge from the hospital within 2 midnights of admission.   Author: Jonnie Finner, DO 05/21/2022 11:31 AM  For on call review [email or web address].

## 2022-05-21 NOTE — ED Provider Notes (Signed)
[facility] Emergency Department Provider Note MRN:  [id number]  Arrival date & time: 05/21/22     Chief Complaint   Flank Pain   History of Present Illness   Rickey Cruz is a 61 y.o. year-old male with a history of kidney stones presenting to the ED with chief complaint of flank pain.  Severe left flank pain starting at 10 PM this evening.  Reports a recent lithotripsy procedure about 5 days ago.  Denies any recent fever or burning with urination.  Review of Systems  A thorough review of systems was obtained and all systems are negative except as noted in the HPI and PMH.   Patient's Health History    Past Medical History:  Diagnosis Date   Allergy    Flonase, Patanol, Zyrtec   Arthritis    psoriasis   Asthma    seasonal, with allergies   Borderline diabetes    Cluster headache    Colon polyp 10/27/2011   Depression    denies 11/11/16; denies 11/21/18 "been years"   Eczema    GERD (gastroesophageal reflux disease)    History of kidney stones    x1   Hyperlipidemia    Hypertension    Low back pain    "in kidney area-was told that it was related to gallstones"   Prediabetes    Prostate cancer ([place])    Psoriasis    [name]   Severe sleep apnea    h/o; had procedure and weight control, does not have to wear CPAP   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 Repeat 5 years.   EXTRACORPOREAL SHOCK WAVE LITHOTRIPSY Left 05/09/2022   Procedure: EXTRACORPOREAL SHOCK WAVE LITHOTRIPSY (ESWL);  Surgeon: Alexis Frock, MD;  Location: [facility];  Service: Urology;  Laterality: Left;   POLYPECTOMY      PROSTATE BIOPSY  10/2014   will have another in 3 months   SEPTOPLASTY  2014   TONSILLECTOMY  2014   UVULOPALATOPHARYNGOPLASTY  2014    Family History  Problem Relation Age of Onset   Stroke Mother    Asthma Sister    Colon cancer Neg Hx    Esophageal cancer Neg Hx    Stomach cancer Neg Hx    Prostate cancer Neg Hx    Diabetes Neg Hx    CAD Neg Hx    Migraines Neg Hx    Headache Neg Hx     Social History   Socioeconomic History   Marital status: Divorced    Spouse name: Not on file   Number of children: 1   Years of education: Not on file   Highest education level: Doctorate  Occupational History   Occupation: Product manager: [employer]    Comment: Physiological scientist School  Tobacco Use   Smoking status: Never   Smokeless tobacco: Never  Vaping Use   Vaping Use: Never used  Substance and Sexual Activity   Alcohol use: Never    Alcohol/week: 0.0 standard drinks   Drug use: Never   Sexual activity: Yes    Comment: SSP  Other Topics Concern   Not on file  Social History Narrative   Original from [place]; moved to [place] 1995.   Marital status: same sexual partner 4 X years.   Children:  1 child/son (18) at 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        Tobacco: never      Alcohol: none      Drugs: none       Exercise: daily in 2018; 10,000 steps daily; lots of walking on campus at work      ADLs: independent with ADLs.        Sexual activity: dating males only in 2018; bisexual.  Previously married to male.       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          Social Determinants of Health   Financial Resource Strain: Not on file  Food Insecurity: Not on file  Transportation Needs: Not on file  Physical Activity: Not on file  Stress: Not on file  Social Connections: Not on file  Intimate Partner Violence: Not on file     Physical Exam   Vitals:   05/21/22 0500 05/21/22 0600  BP: 113/69 106/77  Pulse: 65 72  Resp: 20 (!) 22  Temp:    SpO2: 94% 93%    CONSTITUTIONAL: Well-appearing, in moderate distress due to pain NEURO/PSYCH:  Alert and oriented x 3, no focal deficits EYES:  eyes equal and reactive ENT/NECK:  no LAD, no JVD CARDIO: Regular rate, well-perfused, normal S1 and S2 PULM:  CTAB no wheezing or rhonchi GI/GU:  non-distended, non-tender MSK/SPINE:  No gross deformities, no edema SKIN:  no rash, atraumatic   *Additional and/or pertinent findings included in MDM below  Diagnostic and Interventional Summary    EKG Interpretation  Date/Time:    Ventricular Rate:    PR Interval:    QRS Duration:   QT Interval:    QTC Calculation:   R Axis:     Text Interpretation:         Labs Reviewed  BASIC METABOLIC PANEL - Abnormal; Notable for the following components:      Result Value   CO2 21 (*)    Glucose, Bld 185 (*)    Creatinine, Ser 1.53 (*)    GFR, Estimated 52 (*)    All other components within normal limits  URINALYSIS, ROUTINE W REFLEX MICROSCOPIC - Abnormal; Notable for the following components:   APPearance HAZY (*)    Hgb urine dipstick SMALL (*)    All other components within normal limits  CBC    CT RENAL STONE STUDY  Final Result      Medications  HYDROmorphone (DILAUDID) injection 1 mg (1 mg Intravenous Given 05/21/22 0244)  ondansetron (ZOFRAN) injection 4 mg (4 mg Intravenous Given 05/21/22 0243)  sodium chloride 0.9 % bolus 1,000 mL (0 mLs Intravenous Stopped 05/21/22 0500)  HYDROmorphone (DILAUDID) injection 1 mg (1 mg Intravenous Given 05/21/22 0312)  ketorolac (TORADOL) 15 MG/ML  injection 15 mg (15 mg Intravenous Given 05/21/22 0312)  ondansetron (ZOFRAN) injection 4 mg (4 mg Intravenous Given 05/21/22 0519)  HYDROmorphone (DILAUDID) injection 1 mg (1 mg Intravenous Given  05/21/22 0520)  LORazepam (ATIVAN) injection 1 mg (1 mg Intravenous Given 05/21/22 0549)     Procedures  /  Critical Care Procedures  ED Course and Medical Decision Making  Initial Impression and Ddx Suspect kidney stone, providing pain control, will evaluate location and size of stone with CT imaging.  Past medical/surgical history that increases complexity of ED encounter: Kidney stones  Interpretation of Diagnostics I personally reviewed the laboratory assessment and my interpretation is as follows: No significant blood count or electrolyte disturbance.  Urinalysis with no signs of infection      Patient Reassessment and Ultimate Disposition/Management Patient with persistent pain, nausea, vomiting.  At 1 point it seemed that he was having a lot of anxiety as well.  After medications listed above he is finally resting, but somnolent, will need a bit more time of observation to ensure safe for discharge.  Case was discussed with Dr. Junious Silk of urology, ideally patient would follow-up as an outpatient for repeat lithotripsy procedure.  Patient management required discussion with the following services or consulting groups:  None  Complexity of Problems Addressed Acute illness or injury that poses threat of life of bodily function  Additional Data Reviewed and Analyzed Further history obtained from: None  Additional Factors Impacting ED Encounter Risk Use of parenteral controlled substances and Consideration of hospitalization  Barth Kirks. Sedonia Small, [place] [email or web address]  Final Clinical Impressions(s) / ED Diagnoses     ICD-10-CM   1. Kidney stone  N20.0       ED Discharge Orders     None        Discharge Instructions  Discussed with and Provided to Patient:     Discharge Instructions      You were evaluated in the Emergency Department and after careful evaluation, we did not find any emergent condition requiring admission or further testing in the hospital.  Your exam/testing today was overall reassuring.  Please return to the Emergency Department if you experience any worsening of your condition.  Thank you for allowing [place] to be a part of your care.        Maudie Flakes, MD 05/21/22 [id number]

## 2022-05-21 NOTE — ED Triage Notes (Signed)
Pt reports with left flank pain. Pt states that the pain became unbearable around 10 pm. Pt was seen on Tuesday for kidney stones and given medication, but he states that it is not helping. Pt is crying.

## 2022-05-21 NOTE — Discharge Instructions (Addendum)
You were evaluated in the Emergency Department and after careful evaluation, we did not find any emergent condition requiring admission or further testing in the hospital.  Your exam/testing today was overall reassuring.  Please return to the Emergency Department if you experience any worsening of your condition.  Thank you for allowing us to be a part of your care.  

## 2022-05-22 ENCOUNTER — Observation Stay (HOSPITAL_COMMUNITY): Payer: BC Managed Care – PPO | Admitting: Anesthesiology

## 2022-05-22 ENCOUNTER — Observation Stay (HOSPITAL_COMMUNITY): Payer: BC Managed Care – PPO

## 2022-05-22 ENCOUNTER — Encounter (HOSPITAL_COMMUNITY)
Admission: EM | Disposition: A | Discharge status: Home / Self Care | Payer: Self-pay | Source: Home / Self Care | Attending: Internal Medicine

## 2022-05-22 DIAGNOSIS — E7800 Pure hypercholesterolemia, unspecified: Secondary | ICD-10-CM

## 2022-05-22 DIAGNOSIS — R7303 Prediabetes: Secondary | ICD-10-CM | POA: Diagnosis present

## 2022-05-22 DIAGNOSIS — Z825 Family history of asthma and other chronic lower respiratory diseases: Secondary | ICD-10-CM | POA: Diagnosis not present

## 2022-05-22 DIAGNOSIS — N179 Acute kidney failure, unspecified: Secondary | ICD-10-CM

## 2022-05-22 DIAGNOSIS — N132 Hydronephrosis with renal and ureteral calculous obstruction: Secondary | ICD-10-CM | POA: Diagnosis present

## 2022-05-22 DIAGNOSIS — J4541 Moderate persistent asthma with (acute) exacerbation: Secondary | ICD-10-CM

## 2022-05-22 DIAGNOSIS — Z79899 Other long term (current) drug therapy: Secondary | ICD-10-CM | POA: Diagnosis not present

## 2022-05-22 DIAGNOSIS — N200 Calculus of kidney: Secondary | ICD-10-CM | POA: Diagnosis present

## 2022-05-22 DIAGNOSIS — R52 Pain, unspecified: Secondary | ICD-10-CM | POA: Diagnosis not present

## 2022-05-22 DIAGNOSIS — I10 Essential (primary) hypertension: Secondary | ICD-10-CM | POA: Diagnosis present

## 2022-05-22 DIAGNOSIS — K219 Gastro-esophageal reflux disease without esophagitis: Secondary | ICD-10-CM | POA: Diagnosis present

## 2022-05-22 DIAGNOSIS — N401 Enlarged prostate with lower urinary tract symptoms: Secondary | ICD-10-CM | POA: Diagnosis present

## 2022-05-22 DIAGNOSIS — E785 Hyperlipidemia, unspecified: Secondary | ICD-10-CM | POA: Diagnosis present

## 2022-05-22 DIAGNOSIS — Z8546 Personal history of malignant neoplasm of prostate: Secondary | ICD-10-CM | POA: Diagnosis not present

## 2022-05-22 DIAGNOSIS — Z7951 Long term (current) use of inhaled steroids: Secondary | ICD-10-CM | POA: Diagnosis not present

## 2022-05-22 DIAGNOSIS — N139 Obstructive and reflux uropathy, unspecified: Secondary | ICD-10-CM

## 2022-05-22 DIAGNOSIS — N138 Other obstructive and reflux uropathy: Secondary | ICD-10-CM | POA: Diagnosis present

## 2022-05-22 DIAGNOSIS — G4730 Sleep apnea, unspecified: Secondary | ICD-10-CM | POA: Diagnosis present

## 2022-05-22 HISTORY — PX: CYSTOSCOPY WITH RETROGRADE PYELOGRAM, URETEROSCOPY AND STENT PLACEMENT: SHX5789

## 2022-05-22 LAB — COMPREHENSIVE METABOLIC PANEL
ALT: 54 U/L — ABNORMAL HIGH (ref 0–44)
AST: 36 U/L (ref 15–41)
Albumin: 3.2 g/dL — ABNORMAL LOW (ref 3.5–5.0)
Alkaline Phosphatase: 49 U/L (ref 38–126)
Anion gap: 6 (ref 5–15)
BUN: 11 mg/dL (ref 6–20)
CO2: 27 mmol/L (ref 22–32)
Calcium: 8.3 mg/dL — ABNORMAL LOW (ref 8.9–10.3)
Chloride: 104 mmol/L (ref 98–111)
Creatinine, Ser: 1.02 mg/dL (ref 0.61–1.24)
GFR, Estimated: >60 mL/min (ref >60)
Glucose, Bld: 110 mg/dL — ABNORMAL HIGH (ref 70–99)
Potassium: 3.6 mmol/L (ref 3.5–5.1)
Sodium: 137 mmol/L (ref 135–145)
Total Bilirubin: 0.7 mg/dL (ref 0.3–1.2)
Total Protein: 5.8 g/dL — ABNORMAL LOW (ref 6.5–8.1)

## 2022-05-22 LAB — CBC
HCT: 40.1 % (ref 39.0–52.0)
Hemoglobin: 13.9 g/dL (ref 13.0–17.0)
MCH: 31.2 pg (ref 26.0–34.0)
MCHC: 34.7 g/dL (ref 30.0–36.0)
MCV: 89.9 fL (ref 80.0–100.0)
Platelets: 172 10*3/uL (ref 150–400)
RBC: 4.46 MIL/uL (ref 4.22–5.81)
RDW: 12.2 % (ref 11.5–15.5)
WBC: 4.9 10*3/uL (ref 4.0–10.5)
nRBC: 0 % (ref 0.0–0.2)

## 2022-05-22 LAB — HEMOGLOBIN A1C
Hgb A1c MFr Bld: 6.3 % — ABNORMAL HIGH (ref 4.8–5.6)
Mean Plasma Glucose: 134.11 mg/dL

## 2022-05-22 SURGERY — CYSTOURETEROSCOPY, WITH RETROGRADE PYELOGRAM AND STENT INSERTION
Anesthesia: General | Site: Ureter | Laterality: Left

## 2022-05-22 MED ORDER — PHENYLEPHRINE 80 MCG/ML (10ML) SYRINGE FOR IV PUSH (FOR BLOOD PRESSURE SUPPORT)
PREFILLED_SYRINGE | INTRAVENOUS | Status: DC | PRN
  Administered 2022-05-22 (×5): 80 ug via INTRAVENOUS

## 2022-05-22 MED ORDER — LIDOCAINE 2% (20 MG/ML) 5 ML SYRINGE
INTRAMUSCULAR | Status: DC | PRN
  Administered 2022-05-22: 80 mg via INTRAVENOUS

## 2022-05-22 MED ORDER — DEXAMETHASONE SODIUM PHOSPHATE 10 MG/ML IJ SOLN
INTRAMUSCULAR | Status: AC
  Filled 2022-05-22: qty 1

## 2022-05-22 MED ORDER — DEXAMETHASONE SODIUM PHOSPHATE 10 MG/ML IJ SOLN
INTRAMUSCULAR | Status: DC | PRN
  Administered 2022-05-22: 8 mg via INTRAVENOUS

## 2022-05-22 MED ORDER — SODIUM CHLORIDE 0.9 % IR SOLN
Status: DC | PRN
  Administered 2022-05-22 (×4): 3000 mL
  Administered 2022-05-22 (×3): 6000 mL

## 2022-05-22 MED ORDER — CEFAZOLIN SODIUM-DEXTROSE 2-3 GM-%(50ML) IV SOLR
INTRAVENOUS | Status: DC | PRN
  Administered 2022-05-22: 2 g via INTRAVENOUS

## 2022-05-22 MED ORDER — AMLODIPINE BESYLATE 5 MG PO TABS
5.0000 mg | ORAL_TABLET | Freq: Every day | ORAL | Status: DC
  Administered 2022-05-22 – 2022-05-24 (×3): 5 mg via ORAL
  Filled 2022-05-22 (×3): qty 1

## 2022-05-22 MED ORDER — HYDROMORPHONE HCL 1 MG/ML IJ SOLN
1.0000 mg | INTRAMUSCULAR | Status: DC | PRN
  Administered 2022-05-22 – 2022-05-24 (×12): 1 mg via INTRAVENOUS
  Filled 2022-05-22 (×14): qty 1

## 2022-05-22 MED ORDER — OXYCODONE HCL 5 MG PO TABS: 5.0000 mg | ORAL_TABLET | Freq: Once | ORAL | Status: DC | PRN

## 2022-05-22 MED ORDER — CHLORHEXIDINE GLUCONATE CLOTH 2 % EX PADS
6.0000 | MEDICATED_PAD | Freq: Every day | CUTANEOUS | Status: DC
  Administered 2022-05-22 – 2022-05-24 (×3): 6 via TOPICAL

## 2022-05-22 MED ORDER — DOCUSATE SODIUM 100 MG PO CAPS
100.0000 mg | ORAL_CAPSULE | Freq: Two times a day (BID) | ORAL | Status: DC
  Administered 2022-05-22 – 2022-05-24 (×5): 100 mg via ORAL
  Filled 2022-05-22 (×5): qty 1

## 2022-05-22 MED ORDER — BISACODYL 10 MG RE SUPP
10.0000 mg | Freq: Every day | RECTAL | Status: DC | PRN
  Administered 2022-05-22: 10 mg via RECTAL
  Filled 2022-05-22: qty 1

## 2022-05-22 MED ORDER — OXYBUTYNIN CHLORIDE 5 MG PO TABS
5.0000 mg | ORAL_TABLET | Freq: Three times a day (TID) | ORAL | Status: DC
  Administered 2022-05-22 – 2022-05-24 (×5): 5 mg via ORAL
  Filled 2022-05-22 (×5): qty 1

## 2022-05-22 MED ORDER — ACETAMINOPHEN 10 MG/ML IV SOLN
INTRAVENOUS | Status: DC | PRN
  Administered 2022-05-22: 1000 mg via INTRAVENOUS

## 2022-05-22 MED ORDER — LISINOPRIL 20 MG PO TABS
40.0000 mg | ORAL_TABLET | Freq: Every day | ORAL | Status: DC
  Administered 2022-05-22 – 2022-05-24 (×3): 40 mg via ORAL
  Filled 2022-05-22 (×3): qty 2

## 2022-05-22 MED ORDER — IOHEXOL 300 MG/ML  SOLN
INTRAMUSCULAR | Status: DC | PRN
  Administered 2022-05-22: 6 mL

## 2022-05-22 MED ORDER — AMISULPRIDE (ANTIEMETIC) 5 MG/2ML IV SOLN: 10.0000 mg | Freq: Once | INTRAVENOUS | Status: DC | PRN

## 2022-05-22 MED ORDER — FENTANYL CITRATE (PF) 100 MCG/2ML IJ SOLN
INTRAMUSCULAR | Status: AC
  Filled 2022-05-22: qty 2

## 2022-05-22 MED ORDER — PROPOFOL 10 MG/ML IV BOLUS
INTRAVENOUS | Status: DC | PRN
Start: 2022-05-22 — End: 2022-05-22
  Administered 2022-05-22: 150 mg via INTRAVENOUS

## 2022-05-22 MED ORDER — FENTANYL CITRATE (PF) 100 MCG/2ML IJ SOLN
INTRAMUSCULAR | Status: DC | PRN
  Administered 2022-05-22 (×2): 50 ug via INTRAVENOUS

## 2022-05-22 MED ORDER — PROPOFOL 10 MG/ML IV BOLUS
INTRAVENOUS | Status: AC
  Filled 2022-05-22: qty 20

## 2022-05-22 MED ORDER — LIDOCAINE HCL (PF) 2 % IJ SOLN
INTRAMUSCULAR | Status: AC
  Filled 2022-05-22: qty 5

## 2022-05-22 MED ORDER — ONDANSETRON HCL 4 MG/2ML IJ SOLN: 4.0000 mg | Freq: Once | INTRAMUSCULAR | Status: DC | PRN

## 2022-05-22 MED ORDER — ROSUVASTATIN CALCIUM 10 MG PO TABS
10.0000 mg | ORAL_TABLET | Freq: Every day | ORAL | Status: DC
  Administered 2022-05-22 – 2022-05-24 (×3): 10 mg via ORAL
  Filled 2022-05-22 (×3): qty 1

## 2022-05-22 MED ORDER — LACTATED RINGERS IV SOLN: INTRAVENOUS | Status: DC | PRN

## 2022-05-22 MED ORDER — FENTANYL CITRATE PF 50 MCG/ML IJ SOSY
25.0000 ug | PREFILLED_SYRINGE | INTRAMUSCULAR | Status: DC | PRN
  Administered 2022-05-22 (×2): 50 ug via INTRAVENOUS

## 2022-05-22 MED ORDER — POLYETHYLENE GLYCOL 3350 17 G PO PACK
17.0000 g | PACK | Freq: Every day | ORAL | Status: DC
  Administered 2022-05-22 – 2022-05-24 (×3): 17 g via ORAL
  Filled 2022-05-22 (×3): qty 1

## 2022-05-22 MED ORDER — ONDANSETRON HCL 4 MG/2ML IJ SOLN
INTRAMUSCULAR | Status: DC | PRN
  Administered 2022-05-22: 4 mg via INTRAVENOUS

## 2022-05-22 MED ORDER — FENTANYL CITRATE PF 50 MCG/ML IJ SOSY
PREFILLED_SYRINGE | INTRAMUSCULAR | Status: AC
  Filled 2022-05-22: qty 3

## 2022-05-22 MED ORDER — FINASTERIDE 5 MG PO TABS
5.0000 mg | ORAL_TABLET | Freq: Every day | ORAL | Status: DC
Start: 2022-05-22 — End: 2022-05-24
  Administered 2022-05-22 – 2022-05-24 (×3): 5 mg via ORAL
  Filled 2022-05-22 (×3): qty 1

## 2022-05-22 MED ORDER — CEFAZOLIN SODIUM-DEXTROSE 2-4 GM/100ML-% IV SOLN
INTRAVENOUS | Status: AC
  Filled 2022-05-22: qty 100

## 2022-05-22 MED ORDER — OXYCODONE HCL 5 MG/5ML PO SOLN: 5.0000 mg | Freq: Once | ORAL | Status: DC | PRN

## 2022-05-22 MED ORDER — ONDANSETRON HCL 4 MG/2ML IJ SOLN
INTRAMUSCULAR | Status: AC
  Filled 2022-05-22: qty 2

## 2022-05-22 MED ORDER — ACETAMINOPHEN 10 MG/ML IV SOLN
INTRAVENOUS | Status: AC
  Filled 2022-05-22: qty 100

## 2022-05-22 SURGICAL SUPPLY — 24 items
BAG URO CATCHER STRL LF (MISCELLANEOUS) ×2 IMPLANT
BASKET ZERO TIP NITINOL 2.4FR (BASKET) IMPLANT
BSKT STON RTRVL ZERO TP 2.4FR (BASKET)
CATH HEMA 3WAY 30CC 22FR COUDE (CATHETERS) ×1 IMPLANT
CATH URET FLEX-TIP 2 LUMEN 10F (CATHETERS) ×1 IMPLANT
CATH URETL OPEN 5X70 (CATHETERS) ×2 IMPLANT
CLOTH BEACON ORANGE TIMEOUT ST (SAFETY) ×2 IMPLANT
FIBER LASER MOSES 200 DFL (Laser) IMPLANT
GLOVE BIOGEL M 7.0 STRL (GLOVE) ×2 IMPLANT
GOWN STRL REUS W/ TWL XL LVL3 (GOWN DISPOSABLE) ×2 IMPLANT
GOWN STRL REUS W/TWL XL LVL3 (GOWN DISPOSABLE) ×4
GUIDEWIRE STR DUAL SENSOR (WIRE) ×4 IMPLANT
GUIDEWIRE ZIPWRE .038 STRAIGHT (WIRE) IMPLANT
KIT TURNOVER KIT A (KITS) IMPLANT
LASER FIB FLEXIVA PULSE ID 365 (Laser) IMPLANT
LOOP CUT BIPOLAR 24F LRG (ELECTROSURGICAL) ×1 IMPLANT
MANIFOLD NEPTUNE II (INSTRUMENTS) ×2 IMPLANT
PACK CYSTO (CUSTOM PROCEDURE TRAY) ×2 IMPLANT
SHEATH URETERAL 12FR 45CM (SHEATH) IMPLANT
STENT URET 6FRX26 CONTOUR (STENTS) ×1 IMPLANT
TRACTIP FLEXIVA PULS ID 200XHI (Laser) IMPLANT
TRACTIP FLEXIVA PULSE ID 200 (Laser)
TUBING CONNECTING 10 (TUBING) ×2 IMPLANT
TUBING UROLOGY SET (TUBING) ×2 IMPLANT

## 2022-05-22 NOTE — Anesthesia Postprocedure Evaluation (Signed)
Anesthesia Post Note  Patient: Rickey Cruz  Procedure(s) Performed: CYSTOSCOPY WITH RETROGRADE PYELOGRAM, URETEROSCOPY AND STENT PLACEMENT; TRANSURETHRAL RESECTION OF PROSTATE (Left: Ureter)     Patient location during evaluation: PACU Anesthesia Type: General Level of consciousness: awake Pain management: pain level controlled Vital Signs Assessment: post-procedure vital signs reviewed and stable Respiratory status: spontaneous breathing and respiratory function stable Cardiovascular status: stable Postop Assessment: no apparent nausea or vomiting Anesthetic complications: no   No notable events documented.  Last Vitals:  Vitals:   05/22/22 1339 05/22/22 1841  BP: (!) 153/97 (!) 158/105  Pulse: 64 61  Resp: 14 19  Temp: 36.6 C   SpO2: 96% 97%    Last Pain:  Vitals:   05/22/22 1641  TempSrc:   PainSc: Asleep                 Merlinda Frederick

## 2022-05-22 NOTE — Progress Notes (Signed)
Day of Surgery Subjective: C/o severe left flank pain, poorly controlled. No fevers, chills. Has had nausea and emesis.  Objective: Vital signs in last 24 hours: Temp:  [97.8 F (36.6 C)-98.7 F (37.1 C)] 98.2 F (36.8 C) (05/28 0557) Pulse Rate:  [55-71] 57 (05/28 0557) Resp:  [10-18] 17 (05/28 0557) BP: (110-128)/(75-97) 118/90 (05/28 0557) SpO2:  [94 %-100 %] 94 % (05/28 0557) Weight:  [83.1 kg] 83.1 kg (05/27 1308)  Intake/Output from previous day: 05/27 0701 - 05/28 0700 In: 2085.4 [I.V.:2085.4] Out: 1150 [Urine:1150] Intake/Output this shift: Total I/O In: -  Out: 600 [Urine:600]  UOP: 1.1L   Physical Exam:  General: Alert and oriented CV: RRR Lungs: Clear Abdomen: Soft, ND, ATTP Ext: NT, No erythema  Lab Results: Recent Labs    05/21/22 0240 05/22/22 0329  HGB 15.7 13.9  HCT 45.4 40.1   BMET Recent Labs    05/21/22 0240 05/22/22 0329  NA 140 137  K 4.0 3.6  CL 110 104  CO2 21* 27  GLUCOSE 185* 110*  BUN 19 11  CREATININE 1.53* 1.02  CALCIUM 9.0 8.3*     Studies/Results: CT RENAL STONE STUDY  Result Date: 05/21/2022 CLINICAL DATA:  Nephrolithiasis, back pain. EXAM: CT ABDOMEN AND PELVIS WITHOUT CONTRAST TECHNIQUE: Multidetector CT imaging of the abdomen and pelvis was performed following the standard protocol without IV contrast. RADIATION DOSE REDUCTION: This exam was performed according to the departmental dose-optimization program which includes automated exposure control, adjustment of the mA and/or kV according to patient size and/or use of iterative reconstruction technique. COMPARISON:  09/20/2021. FINDINGS: Lower chest: Scattered coronary artery calcifications are noted. Dependent atelectasis is noted bilaterally. A stable 4 mm nodule is present in the left lower lobe, axial image 38. Hepatobiliary: No focal liver abnormality is seen. Status post cholecystectomy. No biliary dilatation. Pancreas: Unremarkable. No pancreatic ductal dilatation  or surrounding inflammatory changes. Spleen: Normal in size without focal abnormality. Adrenals/Urinary Tract: The adrenal glands are within normal limits. A nonobstructive calculus is present in the lower pole the left kidney. A 4 x 6 mm stone is present in the mid left ureter resulting in mild hydronephrosis. Perinephric and periureteral fat stranding are noted on the left. No obstructive uropathy on the right. Bladder is unremarkable. Stomach/Bowel: Stomach is within normal limits. Appendix is surgically absent. No evidence of bowel wall thickening, distention, or inflammatory changes. No free air or pneumatosis. Vascular/Lymphatic: Aortic atherosclerosis. No enlarged abdominal or pelvic lymph nodes. Reproductive: The prostate gland is enlarged with a nodular contour exerting mass effect on the base of the urinary bladder. Other: Small fat containing inguinal hernias bilaterally. No ascites. Musculoskeletal: Mild degenerative changes in the thoracolumbar spine. No acute osseous abnormality. IMPRESSION: 1. Mild obstructive uropathy on the left with a 6 x 4 mm calculus in the mid left ureter. 2. Nonobstructive left renal calculus. 3. Enlarged prostate gland. 4. Aortic atherosclerosis. 5. Stable 4 mm nodule in the left lower lobe. No follow-up recommended. This recommendation follows the consensus statement: Guidelines for Management of Incidental Pulmonary Nodules Detected on CT Images: From the Fleischner Society 2017; Radiology 2017; 284:228-243. Electronically Signed   By: Brett Fairy M.D.   On: 05/21/2022 03:31    Assessment/Plan: Obstructing left ureteral stone measuring 5 mm: S/p left ESWL of a left lower pole stone on 05/09/2022.  Also present is a 4 mm left lower pole stone. BPH with large median lobe  -Discussed options including continued medical therapy with reattempt at ESWL versus  ureteral stent versus primary ureteroscopy today. -No sign of infection. -He elected proceed with cystoscopy,  possible transurethral section of prostate, left ureteral stent placement, possible left ureteroscopy with laser lithotripsy and extraction of stones.    LOS: 0 days   Matt R. Rita Prom MD 05/22/2022, 10:19 AM [facility]  Pager: [phone]

## 2022-05-22 NOTE — Interval H&P Note (Signed)
Ok to proceed. 

## 2022-05-22 NOTE — Progress Notes (Signed)
PROGRESS NOTE    Rickey Cruz  WUJ:[id number] DOB: [birth year] DOA: 05/21/2022 PCP: Horald Pollen, MD   Brief Narrative:  Rickey Cruz is a 61 y.o. male with medical history significant of HTN, HLD, asthma. Presenting with left flank pain. He has a history of left non-obstruction kidney stones. He has been followed by urology. He had a shockwave lithotripsy procedure performed on 05/09/22. He reports that 3 days ago, he had acute N/V and left flank pain. He went to urgent care and was told he had some retained stones. He was sent to the ED. There he improved and was given percocet and zofran. He was discharged to home.  He reports that his pain returned and was more intense over the last day or so. It has not been relieved with percocet. He has had N/V during this time. He has not had fever, diarrhea, dysuria. He has not noticed any stones in his urine. When his symptoms didn't improve last night, he decided to come to the ED for assistance. He denies any other aggravating or alleviating factors.    After admission he was seen by urology and is now POD 0 from: 1.  Bipolar transurethral resection of prostate (limited of median lobe) 2. Diagnostic left ureteroscopy 3. Left ureteral stent placement 4. Left retrograde pyelogram  Pt tolerated the procedure well and is now receiving CBI with likely d/c tomorow   Assessment & Plan:   Principal Problem:   Intractable pain Active Problems:   Essential hypertension   HLD (hyperlipidemia)   Prediabetes   Moderate persistent asthma with acute exacerbation   Obstructive uropathy   AKI (acute kidney injury) (HCC)   Mild obstruction uropathy now POD 0 TURP AKI     - Postop analgesics     - CBI per urology     - fluids, anti-emetics, pain control as above   Intractable pain secondary to above     - PRN pain control   HTN     - resumed home regimen   HLD     - resumed home regimen   Asthma     - continue home regimen    Pre-diabetes     - last A1c was 01/12/22 (6.0); glucose is elevated     - A1c 6.3  DVT prophylaxis: SCD/Compression stockings  Code Status: full    Code Status Orders  (From admission, onward)           Start     Ordered   05/21/22 1303  Full code  Continuous        05/21/22 1302           Code Status History     This patient has a current code status but no historical code status.      Family Communication: wife and son at bedside  Disposition Plan:   pt will remain in hospital, receive CBI and postop management, anticipate d/c in am Consults called: None Admission status: inpt   Consultants:  urlogy  Procedures:  DG Chest 2 View  Result Date: 04/28/2022 CLINICAL DATA:  Productive cough.  Shortness of breath. EXAM: CHEST - 2 VIEW COMPARISON:  Chest two views 10/31/2021 FINDINGS: Cardiac silhouette and mediastinal contours are within normal limits. Mild calcification within aortic arch. The lungs are clear. No pleural effusion or pneumothorax. Mild multilevel degenerative disc changes of the thoracic spine. Cholecystectomy clips. IMPRESSION: No active cardiopulmonary disease. Electronically Signed   By: Yvonne Kendall M.D.   On:  04/28/2022 10:19   DG Abdomen 1 View  Result Date: 05/18/2022 CLINICAL DATA:  Left flank pain.  Recent lithotripsy EXAM: ABDOMEN - 1 VIEW COMPARISON:  05/09/2022 FINDINGS: Two small calculi overlying the left lower pole. These likely are fragmented pieces of the slightly larger stone seen on the prior study. No ureteral stone. No right renal calculi Phleboliths in left pelvis unchanged. IMPRESSION: Left renal calculus appears fragmented, now overlying the left lower pole. No ureteral calculus identified. Electronically Signed   By: Franchot Gallo M.D.   On: 05/18/2022 13:28   DG Abd 1 View  Result Date: 05/10/2022 CLINICAL DATA:  Left-sided flank pain.  Patient for lithotripsy. EXAM: ABDOMEN - 1 VIEW COMPARISON:  Abdomen 05/04/2022. Renal  ultrasound 04/29/2022. CT 09/20/2021. FINDINGS: Surgical clips noted over the abdomen. Stool noted throughout the colon. No bowel distention. 5 mm left renal stone again noted in similar position. Calcific densities over the right kidney cannot be completely excluded. This may represent overlying bowel/stool. Pelvic calcifications appear stable and are most likely phleboliths. Degenerative change lumbar spine and both hips. Tiny sclerotic density again noted over the right ilium, most likely a benign bone island. IMPRESSION: 1.  5 mm left renal stone again noted in similar position. 2. Calcific densities in the right kidney cannot be completely excluded. These may represent overlying bowel/stool. Pelvic calcifications appear stable and are most likely phleboliths. Electronically Signed   By: Marcello Moores  Register M.D.   On: 05/10/2022 09:12   US Renal  Result Date: 05/01/2022 CLINICAL DATA:  Left kidney stone. EXAM: RENAL / URINARY TRACT ULTRASOUND COMPLETE COMPARISON:  CT abdomen and pelvis 09/09/2019 FINDINGS: Right Kidney: Renal measurements: 10.5 x 6.0 x 5.8 cm = volume: 192 mL. Echogenicity within normal limits. No mass or hydronephrosis visualized. Left Kidney: Renal measurements: 12.7 x 5.3 x 6.0 cm = volume: 207 mL. Echogenicity within normal limits. There are 2 calculi scratch at there are 3 calculi visualized measuring up to 1 cm in the mid and lower pole of the kidney. No mass or hydronephrosis visualized. Bladder: Appears normal for degree of bladder distention. There is nodular protrusion of the prostate gland into the bladder base. Prostate gland is enlarged measuring 8.4 x 5.1 x 6.7 cm. Appearance is similar to prior. Other: None. IMPRESSION: 1. Nonobstructing left renal calculi. 2. Unchanged nodular protrusion of the prostate into the bladder base. Recommend clinical correlation and follow-up Electronically Signed   By: Ronney Asters M.D.   On: 05/01/2022 00:03   DG C-Arm 1-60 Min-No Report  Result  Date: 05/22/2022 Fluoroscopy was utilized by the requesting physician.  No radiographic interpretation.   DG C-Arm 1-60 Min-No Report  Result Date: 05/22/2022 Fluoroscopy was utilized by the requesting physician.  No radiographic interpretation.   CT RENAL STONE STUDY  Result Date: 05/21/2022 CLINICAL DATA:  Nephrolithiasis, back pain. EXAM: CT ABDOMEN AND PELVIS WITHOUT CONTRAST TECHNIQUE: Multidetector CT imaging of the abdomen and pelvis was performed following the standard protocol without IV contrast. RADIATION DOSE REDUCTION: This exam was performed according to the departmental dose-optimization program which includes automated exposure control, adjustment of the mA and/or kV according to patient size and/or use of iterative reconstruction technique. COMPARISON:  09/20/2021. FINDINGS: Lower chest: Scattered coronary artery calcifications are noted. Dependent atelectasis is noted bilaterally. A stable 4 mm nodule is present in the left lower lobe, axial image 38. Hepatobiliary: No focal liver abnormality is seen. Status post cholecystectomy. No biliary dilatation. Pancreas: Unremarkable. No pancreatic ductal dilatation or surrounding  inflammatory changes. Spleen: Normal in size without focal abnormality. Adrenals/Urinary Tract: The adrenal glands are within normal limits. A nonobstructive calculus is present in the lower pole the left kidney. A 4 x 6 mm stone is present in the mid left ureter resulting in mild hydronephrosis. Perinephric and periureteral fat stranding are noted on the left. No obstructive uropathy on the right. Bladder is unremarkable. Stomach/Bowel: Stomach is within normal limits. Appendix is surgically absent. No evidence of bowel wall thickening, distention, or inflammatory changes. No free air or pneumatosis. Vascular/Lymphatic: Aortic atherosclerosis. No enlarged abdominal or pelvic lymph nodes. Reproductive: The prostate gland is enlarged with a nodular contour exerting mass  effect on the base of the urinary bladder. Other: Small fat containing inguinal hernias bilaterally. No ascites. Musculoskeletal: Mild degenerative changes in the thoracolumbar spine. No acute osseous abnormality. IMPRESSION: 1. Mild obstructive uropathy on the left with a 6 x 4 mm calculus in the mid left ureter. 2. Nonobstructive left renal calculus. 3. Enlarged prostate gland. 4. Aortic atherosclerosis. 5. Stable 4 mm nodule in the left lower lobe. No follow-up recommended. This recommendation follows the consensus statement: Guidelines for Management of Incidental Pulmonary Nodules Detected on CT Images: From the Fleischner Society 2017; Radiology 2017; 284:228-243. Electronically Signed   By: Brett Fairy M.D.   On: 05/21/2022 03:31       Subjective: Pt reported some post procedure pain, o/w no acute changes  Objective: Vitals:   05/22/22 1255 05/22/22 1300 05/22/22 1315 05/22/22 1339  BP:  (!) 149/102 (!) 152/100 (!) 153/97  Pulse: 67 81 62 64  Resp: (!) '8 13 12 14  '$ Temp:  (!) 97.5 F (36.4 C)  97.8 F (36.6 C)  TempSrc:    Oral  SpO2: 98% 98% 98% 96%  Weight:      Height:        Intake/Output Summary (Last 24 hours) at 05/22/2022 1441 Last data filed at 05/22/2022 1434 Gross per 24 hour  Intake 7185.41 ml  Output 6900 ml  Net 285.41 ml   Filed Weights   05/21/22 0153 05/21/22 1308  Weight: 83.9 kg 83.1 kg    Examination:  General exam: Appears calm and comfortable  Respiratory system: Clear to auscultation. Respiratory effort normal. Cardiovascular system: S1 & S2 heard, RRR. No JVD, murmurs, rubs, gallops or clicks. No pedal edema. Gastrointestinal system: Abdomen is nondistended, soft and nontender. No organomegaly or masses felt. Normal bowel sounds heard. Central nervous system: Alert and oriented. No focal neurological deficits. Extremities: Symmetric 5 x 5 power. Skin: No rashes, lesions or ulcers Psychiatry: Judgement and insight appear normal. Mood & affect  appropriate.     Data Reviewed: I have personally reviewed following labs and imaging studies  CBC: Recent Labs  Lab 05/18/22 1254 05/18/22 1512 05/21/22 0240 05/22/22 0329  WBC 7.5 7.6 8.9 4.9  NEUTROABS 6.2 6.4  --   --   HGB 16.3 17.0 15.7 13.9  HCT 45.4 48.7 45.4 40.1  MCV 86.8 87.9 88.5 89.9  PLT 226 235 226 283   Basic Metabolic Panel: Recent Labs  Lab 05/18/22 1254 05/18/22 1512 05/21/22 0240 05/22/22 0329  NA 138 140 140 137  K 3.4* 3.5 4.0 3.6  CL 106 108 110 104  CO2 23 22 21* 27  GLUCOSE 137* 123* 185* 110*  BUN '13 16 19 11  '$ CREATININE 1.02 1.04 1.53* 1.02  CALCIUM 8.8* 9.1 9.0 8.3*   GFR: Estimated Creatinine Clearance: 77.9 mL/min (by C-G formula based on SCr  of 1.02 mg/dL). Liver Function Tests: Recent Labs  Lab 05/18/22 1254 05/18/22 1512 05/22/22 0329  AST 22 24 36  ALT 33 35 54*  ALKPHOS 46 53 49  BILITOT 1.0 1.0 0.7  PROT 6.9 7.8 5.8*  ALBUMIN 4.0 4.5 3.2*   No results for input(s): LIPASE, AMYLASE in the last 168 hours. No results for input(s): AMMONIA in the last 168 hours. Coagulation Profile: No results for input(s): INR, PROTIME in the last 168 hours. Cardiac Enzymes: No results for input(s): CKTOTAL, CKMB, CKMBINDEX, TROPONINI in the last 168 hours. BNP (last 3 results) No results for input(s): PROBNP in the last 8760 hours. HbA1C: Recent Labs    05/22/22 0329  HGBA1C 6.3*   CBG: No results for input(s): GLUCAP in the last 168 hours. Lipid Profile: No results for input(s): CHOL, HDL, LDLCALC, TRIG, CHOLHDL, LDLDIRECT in the last 72 hours. Thyroid Function Tests: No results for input(s): TSH, T4TOTAL, FREET4, T3FREE, THYROIDAB in the last 72 hours. Anemia Panel: No results for input(s): VITAMINB12, FOLATE, FERRITIN, TIBC, IRON, RETICCTPCT in the last 72 hours. Sepsis Labs: No results for input(s): PROCALCITON, LATICACIDVEN in the last 168 hours.  Recent Results (from the past 240 hour(s))  Urine Culture     Status:  None   Collection Time: 05/18/22  2:00 PM   Specimen: Urine, Clean Catch  Result Value Ref Range Status   Specimen Description URINE, CLEAN CATCH  Final   Special Requests NONE  Final   Culture   Final    NO GROWTH Performed at [facility], [address] [place], [place] [address]    Report Status 05/19/2022 FINAL  Final         Radiology Studies: DG C-Arm 1-60 Min-No Report  Result Date: 05/22/2022 Fluoroscopy was utilized by the requesting physician.  No radiographic interpretation.   DG C-Arm 1-60 Min-No Report  Result Date: 05/22/2022 Fluoroscopy was utilized by the requesting physician.  No radiographic interpretation.   CT RENAL STONE STUDY  Result Date: 05/21/2022 CLINICAL DATA:  Nephrolithiasis, back pain. EXAM: CT ABDOMEN AND PELVIS WITHOUT CONTRAST TECHNIQUE: Multidetector CT imaging of the abdomen and pelvis was performed following the standard protocol without IV contrast. RADIATION DOSE REDUCTION: This exam was performed according to the departmental dose-optimization program which includes automated exposure control, adjustment of the mA and/or kV according to patient size and/or use of iterative reconstruction technique. COMPARISON:  09/20/2021. FINDINGS: Lower chest: Scattered coronary artery calcifications are noted. Dependent atelectasis is noted bilaterally. A stable 4 mm nodule is present in the left lower lobe, axial image 38. Hepatobiliary: No focal liver abnormality is seen. Status post cholecystectomy. No biliary dilatation. Pancreas: Unremarkable. No pancreatic ductal dilatation or surrounding inflammatory changes. Spleen: Normal in size without focal abnormality. Adrenals/Urinary Tract: The adrenal glands are within normal limits. A nonobstructive calculus is present in the lower pole the left kidney. A 4 x 6 mm stone is present in the mid left ureter resulting in mild hydronephrosis. Perinephric and periureteral fat stranding are noted on the left. No  obstructive uropathy on the right. Bladder is unremarkable. Stomach/Bowel: Stomach is within normal limits. Appendix is surgically absent. No evidence of bowel wall thickening, distention, or inflammatory changes. No free air or pneumatosis. Vascular/Lymphatic: Aortic atherosclerosis. No enlarged abdominal or pelvic lymph nodes. Reproductive: The prostate gland is enlarged with a nodular contour exerting mass effect on the base of the urinary bladder. Other: Small fat containing inguinal hernias bilaterally. No ascites. Musculoskeletal: Mild degenerative  changes in the thoracolumbar spine. No acute osseous abnormality. IMPRESSION: 1. Mild obstructive uropathy on the left with a 6 x 4 mm calculus in the mid left ureter. 2. Nonobstructive left renal calculus. 3. Enlarged prostate gland. 4. Aortic atherosclerosis. 5. Stable 4 mm nodule in the left lower lobe. No follow-up recommended. This recommendation follows the consensus statement: Guidelines for Management of Incidental Pulmonary Nodules Detected on CT Images: From the Fleischner Society 2017; Radiology 2017; 284:228-243. Electronically Signed   By: Brett Fairy M.D.   On: 05/21/2022 03:31        Scheduled Meds:  Chlorhexidine Gluconate Cloth  6 each Topical Daily   [id number]       tamsulosin  0.4 mg Oral Daily   Continuous Infusions:  ceFAZolin     lactated ringers 125 mL/hr at 05/22/22 1024     LOS: 0 days    Time spent: 31 min    Nicolette Bang, MD [facility]  If 7PM-7AM, please contact night-coverage  05/22/2022, 2:41 PM

## 2022-05-22 NOTE — H&P (View-Only) (Signed)
Day of Surgery Subjective: C/o severe left flank pain, poorly controlled. No fevers, chills. Has had nausea and emesis.  Objective: Vital signs in last 24 hours: Temp:  [97.8 F (36.6 C)-98.7 F (37.1 C)] 98.2 F (36.8 C) (05/28 0557) Pulse Rate:  [55-71] 57 (05/28 0557) Resp:  [10-18] 17 (05/28 0557) BP: (110-128)/(75-97) 118/90 (05/28 0557) SpO2:  [94 %-100 %] 94 % (05/28 0557) Weight:  [83.1 kg] 83.1 kg (05/27 1308)  Intake/Output from previous day: 05/27 0701 - 05/28 0700 In: 2085.4 [I.V.:2085.4] Out: 1150 [Urine:1150] Intake/Output this shift: Total I/O In: -  Out: 600 [Urine:600]  UOP: 1.1L   Physical Exam:  General: Alert and oriented CV: RRR Lungs: Clear Abdomen: Soft, ND, ATTP Ext: NT, No erythema  Lab Results: Recent Labs    05/21/22 0240 05/22/22 0329  HGB 15.7 13.9  HCT 45.4 40.1   BMET Recent Labs    05/21/22 0240 05/22/22 0329  NA 140 137  K 4.0 3.6  CL 110 104  CO2 21* 27  GLUCOSE 185* 110*  BUN 19 11  CREATININE 1.53* 1.02  CALCIUM 9.0 8.3*     Studies/Results: CT RENAL STONE STUDY  Result Date: 05/21/2022 CLINICAL DATA:  Nephrolithiasis, back pain. EXAM: CT ABDOMEN AND PELVIS WITHOUT CONTRAST TECHNIQUE: Multidetector CT imaging of the abdomen and pelvis was performed following the standard protocol without IV contrast. RADIATION DOSE REDUCTION: This exam was performed according to the departmental dose-optimization program which includes automated exposure control, adjustment of the mA and/or kV according to patient size and/or use of iterative reconstruction technique. COMPARISON:  09/20/2021. FINDINGS: Lower chest: Scattered coronary artery calcifications are noted. Dependent atelectasis is noted bilaterally. A stable 4 mm nodule is present in the left lower lobe, axial image 38. Hepatobiliary: No focal liver abnormality is seen. Status post cholecystectomy. No biliary dilatation. Pancreas: Unremarkable. No pancreatic ductal dilatation  or surrounding inflammatory changes. Spleen: Normal in size without focal abnormality. Adrenals/Urinary Tract: The adrenal glands are within normal limits. A nonobstructive calculus is present in the lower pole the left kidney. A 4 x 6 mm stone is present in the mid left ureter resulting in mild hydronephrosis. Perinephric and periureteral fat stranding are noted on the left. No obstructive uropathy on the right. Bladder is unremarkable. Stomach/Bowel: Stomach is within normal limits. Appendix is surgically absent. No evidence of bowel wall thickening, distention, or inflammatory changes. No free air or pneumatosis. Vascular/Lymphatic: Aortic atherosclerosis. No enlarged abdominal or pelvic lymph nodes. Reproductive: The prostate gland is enlarged with a nodular contour exerting mass effect on the base of the urinary bladder. Other: Small fat containing inguinal hernias bilaterally. No ascites. Musculoskeletal: Mild degenerative changes in the thoracolumbar spine. No acute osseous abnormality. IMPRESSION: 1. Mild obstructive uropathy on the left with a 6 x 4 mm calculus in the mid left ureter. 2. Nonobstructive left renal calculus. 3. Enlarged prostate gland. 4. Aortic atherosclerosis. 5. Stable 4 mm nodule in the left lower lobe. No follow-up recommended. This recommendation follows the consensus statement: Guidelines for Management of Incidental Pulmonary Nodules Detected on CT Images: From the Fleischner Society 2017; Radiology 2017; 284:228-243. Electronically Signed   By: Brett Fairy M.D.   On: 05/21/2022 03:31    Assessment/Plan: Obstructing left ureteral stone measuring 5 mm: S/p left ESWL of a left lower pole stone on 05/09/2022.  Also present is a 4 mm left lower pole stone. BPH with large median lobe  -Discussed options including continued medical therapy with reattempt at ESWL versus  ureteral stent versus primary ureteroscopy today. -No sign of infection. -He elected proceed with cystoscopy,  possible transurethral section of prostate, left ureteral stent placement, possible left ureteroscopy with laser lithotripsy and extraction of stones.    LOS: 0 days   Matt R. Inice Sanluis MD 05/22/2022, 10:19 AM [facility]  Pager: [phone]

## 2022-05-22 NOTE — Procedures (Deleted)
Operative Note  Preoperative diagnosis:  1.  Obstructing left ureteral stone 2. 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 Obstructing left ureteral stone 2. 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  Bipolar transurethral resection of prostate (limited of median lobe) 2. Diagnostic left ureteroscopy 3. Left ureteral stent placement 4. Left retrograde pyelogram  Surgeon: Rexene Alberts, MD  Assistants:  None  Anesthesia:  General  Complications:  None  EBL:  41m  Specimens: 1. Prostate chips  Drains/Catheters: 1.  22Fr 3 way catheter with 524mwater into balloon  Intraoperative findings:   Very large median lobe with intravesical protrusion into the bladder measuring approximately 3.5 x 4.0 x 4.0 cm on preoperative CT scan.  This median lobe was obstructing the view of the trigone. I resected the left half of the intravesical protruding prostate with a very friable prostate and eventual excellent hemostasis. Unable to pass a dual-lumen catheter, semirigid ureteral scope or flexible ureteroscope into the distal left ureter given J hooking of the ureter, obstructing prostate and narrowed intradetrusor portion of the ureter. Left retrograde pyelogram with mild left hydronephrosis. Left ureteral stent placement attached to a short segment of a string to facilitate easier removal.  Indication:  Rickey [name] a [age].o. male who underwent ESWL for a nonobstructing left renal stone.  He developed acute left-sided flank pain and was admitted for pain control.  He was found to have approximately 5 mm mid left ureteral stone.  His pain persisted and he opted for attempt at primary ureteroscopy with possible TURP to treat his stone.  Of note, he has a very large prostate estimated be about 100 cc with severe intravesical portion measuring 4.0 x 3.5 x 4.0 cm.  Description of procedure: The indication,  alternatives, benefits and risks were discussed with the patient and informed consent was obtained.  Patient was brought to the operating room table, positioned supine, secured with a safety strap.  Pneumatic compression devices were placed on the lower extremities.  After the administration of intravenous antibiotics and general anesthesia, the patient was repositioned into the dorsal lithotomy position.  All pressure points were carefully padded.  A rectal examination was performed confirming a smooth symmetric enlarged gland.  The genitalia were prepped and draped in standard sterile manner.  A timeout was completed, verifying the correct patient, surgical procedure and positioning prior to beginning the procedure.  Isotonic sodium chloride was used for irrigation.  A 21 French rigid cystoscope was passed into the bladder noting severe trilobar obstructing prostate with very large median lobe.  I attempted to visualize the trigone however was unable given his large intravesical protrusion of the prostate.  Thus I elected to proceed with limited transurethral section of the prostate in order to visualize his ureter.  A 26 French continuous-flow resectoscope sheath with the visual obturator and a 30 degree lens was advanced under direct vision into the bladder.  The anterior urethra appeared normal in its entirety.The prostatic urethra was elongated with trilobar hyperplasia.  On cystoscopic evaluation, his bladder capacity appeared smaller.  His median lobe was compressed against the bladder and the bladder would not expand further making the resection extremely difficult in order to avoid injury into the bladder wall.  The obturator was removed and replaced by the working element with a resection loop.  Starting at the bladder neck I began a limited resection of his intravesical protrusion of the prostate.  With each swipe, I had to pull the prostate towards my scope in order to create some space between  the intravesical component of the prostate and the bladder wall.  This is extremely tedious.  Eventually, I was able to resect approximately half of his left side of his median lobe.  I was able to visualize the ureter.  I then obtained hemostasis.  I passed a 0.038 sensor wire up to the level of the kidney.  Over this, passed a 5 [place] open-ended catheter and performed a left retrograde pyelogram demonstrating mild left hydronephrosis.  I was unable to visualize calcification on fluoroscopy. I then attempted to pass a dual-lumen catheter however this would not bypass the level of his intradetrusor ureter.  I then attempted to pass a semirigid ureteroscope adjacent to the wire however similarly, was unable to advance this beyond the distal ureter given some J hooking of the ureter and persistent compression of his prostate.  I then switched to a cystoscopy and passed a separate 0.038 sensor wire.  Over this wire, I attempted to pass a flexible ureteroscope however similarly, this would not pass beyond the level of the distal ureter.  Thus, elected to abort attempt at primary ureteroscopy and leave a stent.  I switch for cystoscopy and passed a 6 [place] by 26 cm left ureteral stent.  Of note, I did leave a short tether string attached to the end of the stent and this remained in the bladder in order to facilitate easier removal given his intravesical protrusion of the prostate.  I then reintroduced the resectoscope and achieved excellent hemostasis over the partial resection of his prostate.  All bleeding vessels were fulgurated achieving meticulous hemostasis.  The bladder was irrigated with a Toomey syringe, ensuring removal of all prostate chips which were sent to pathology for evaluation.  Having completed the resection and the chips removed, we again confirmed hemostasis with the loop with coagulating current.  The bladder was reexamined and there is no evidence of injury to the bladder.  The  resectoscope was withdrawn under direct vision and a 22 French three-way Foley catheter with a 30 cc balloon was inserted into the bladder.  The balloon was inflated with 50 cc of sterile water.  After multiple manual irrigations ensuring light pink return of the irrigant, the procedure was terminated.  The catheter was attached to a drainage bag and continuous bladder irrigation was started with normal saline.  The patient was positioned supine.  At the end of the procedure, all counts were correct.  Patient tolerated the procedure well and was taken to the recovery room satisfactory condition.  Plan: Continuous bladder irrigation overnight.  Hopefully will discharge home tomorrow with Foley catheter in place and plan for void trial outpatient.  He still has left ureteral stone and left renal stone in place.  We could attempt delayed ureteroscopy however given his intravesical protrusion of the prostate, this would again be very difficult.  Alternatively, he could proceed with shockwave lithotripsy to the remaining ureteral stent.  I recommend that he undergo a robotic simple prostatectomy to rid him of this very large obstructing prostate.  Rickey R. [facility]  Pager: [phone]

## 2022-05-22 NOTE — Progress Notes (Signed)
The patient returned from surgical procedure. He is alert, oriented x4. 3way Foley catheter is intact. Urine is pink. Per PACU RN CBI rate increased prior to transport. Will continue to monitor.

## 2022-05-22 NOTE — Progress Notes (Signed)
Day of Surgery Subjective: C/o consipation, rectal pressure. No nausea or emesis.  Objective: Vital signs in last 24 hours: Temp:  [97.5 F (36.4 C)-98.7 F (37.1 C)] 98 F (36.7 C) (05/28 1841) Pulse Rate:  [56-81] 61 (05/28 1841) Resp:  [8-19] 19 (05/28 1841) BP: (112-158)/(87-109) 156/94 (05/28 1900) SpO2:  [94 %-100 %] 97 % (05/28 1841)  Intake/Output from previous day: 05/27 0701 - 05/28 0700 In: 2085.4 [I.V.:2085.4] Out: 1150 [Urine:1150] Intake/Output this shift: No intake/output data recorded.  Physical Exam:  General: Alert and oriented CV: RRR Lungs: Clear Abdomen: Soft, ND, NT Ext: NT, No erythema  Lab Results: Recent Labs    05/21/22 0240 05/22/22 0329  HGB 15.7 13.9  HCT 45.4 40.1   BMET Recent Labs    05/21/22 0240 05/22/22 0329  NA 140 137  K 4.0 3.6  CL 110 104  CO2 21* 27  GLUCOSE 185* 110*  BUN 19 11  CREATININE 1.53* 1.02  CALCIUM 9.0 8.3*     Studies/Results: DG C-Arm 1-60 Min-No Report  Result Date: 05/22/2022 Fluoroscopy was utilized by the requesting physician.  No radiographic interpretation.   DG C-Arm 1-60 Min-No Report  Result Date: 05/22/2022 Fluoroscopy was utilized by the requesting physician.  No radiographic interpretation.   CT RENAL STONE STUDY  Result Date: 05/21/2022 CLINICAL DATA:  Nephrolithiasis, back pain. EXAM: CT ABDOMEN AND PELVIS WITHOUT CONTRAST TECHNIQUE: Multidetector CT imaging of the abdomen and pelvis was performed following the standard protocol without IV contrast. RADIATION DOSE REDUCTION: This exam was performed according to the departmental dose-optimization program which includes automated exposure control, adjustment of the mA and/or kV according to patient size and/or use of iterative reconstruction technique. COMPARISON:  09/20/2021. FINDINGS: Lower chest: Scattered coronary artery calcifications are noted. Dependent atelectasis is noted bilaterally. A stable 4 mm nodule is present in the left  lower lobe, axial image 38. Hepatobiliary: No focal liver abnormality is seen. Status post cholecystectomy. No biliary dilatation. Pancreas: Unremarkable. No pancreatic ductal dilatation or surrounding inflammatory changes. Spleen: Normal in size without focal abnormality. Adrenals/Urinary Tract: The adrenal glands are within normal limits. A nonobstructive calculus is present in the lower pole the left kidney. A 4 x 6 mm stone is present in the mid left ureter resulting in mild hydronephrosis. Perinephric and periureteral fat stranding are noted on the left. No obstructive uropathy on the right. Bladder is unremarkable. Stomach/Bowel: Stomach is within normal limits. Appendix is surgically absent. No evidence of bowel wall thickening, distention, or inflammatory changes. No free air or pneumatosis. Vascular/Lymphatic: Aortic atherosclerosis. No enlarged abdominal or pelvic lymph nodes. Reproductive: The prostate gland is enlarged with a nodular contour exerting mass effect on the base of the urinary bladder. Other: Small fat containing inguinal hernias bilaterally. No ascites. Musculoskeletal: Mild degenerative changes in the thoracolumbar spine. No acute osseous abnormality. IMPRESSION: 1. Mild obstructive uropathy on the left with a 6 x 4 mm calculus in the mid left ureter. 2. Nonobstructive left renal calculus. 3. Enlarged prostate gland. 4. Aortic atherosclerosis. 5. Stable 4 mm nodule in the left lower lobe. No follow-up recommended. This recommendation follows the consensus statement: Guidelines for Management of Incidental Pulmonary Nodules Detected on CT Images: From the Fleischner Society 2017; Radiology 2017; 284:228-243. Electronically Signed   By: Brett Fairy M.D.   On: 05/21/2022 03:31    Assessment/Plan: Obstructing left ureteral stone measuring 5 mm: S/p left ESWL of a left lower pole stone on 05/09/2022.  Also present is a 4 mm  left lower pole stone. S/p limited TURP of median lobe, cysto, L  RPG, L stent 5/28 BPH with large median lobe  -Evaluated at bedside. Removed 20 cc from balloon. 30cc left in balloon. Irrigated with 544m with no clots, slight pink tinge urine. Not difficult. Performed bladder scan with decompressed bladder around 30cc foley baloon. No obvious clots. -DRE at bedside with at least 100g prostate, no stool in vault -Bowel regimen -Added finasteride to help prostatic bleeding -Flomax for stent discomfort -Ditropan for foley discomfort -Weaned down CBI -He has a small bladder capacity. I drew picture and explained his discomfort is likely related to foley and stent. Added meds above. -Hopefully we can void trial tomorrow if bleeding improved.    LOS: 0 days   Matt R. Erinn Huskins MD 05/22/2022, 7:05 PM [facility]  Pager: [phone]

## 2022-05-22 NOTE — Plan of Care (Signed)

## 2022-05-22 NOTE — Progress Notes (Signed)
The patient is complaining of pain in penis 8/10 and pressure in rectum. The pt has ambulated to the bathroom x3. Foley cathter remains patent, urine is pink clear.Will continue to monitor

## 2022-05-22 NOTE — Transfer of Care (Signed)
Immediate Anesthesia Transfer of Care Note  Patient: Rickey Cruz  Procedure(s) Performed: CYSTOSCOPY WITH RETROGRADE PYELOGRAM, URETEROSCOPY AND STENT PLACEMENT; TRANSURETHRAL RESECTION OF PROSTATE (Left: Ureter)  Patient Location: PACU  Anesthesia Type:General  Level of Consciousness: drowsy and patient cooperative  Airway & Oxygen Therapy: Patient Spontanous Breathing and Patient connected to face mask oxygen  Post-op Assessment: Report given to RN and Post -op Vital signs reviewed and stable  Post vital signs: Reviewed and stable  Last Vitals:  Vitals Value Taken Time  BP 137/115 05/22/22 1232  Temp    Pulse 79 05/22/22 1234  Resp 15 05/22/22 1234  SpO2 100 % 05/22/22 1234  Vitals shown include unvalidated device data.  Last Pain:  Vitals:   05/22/22 0906  TempSrc:   PainSc: 3       Patients Stated Pain Goal: 3 (07/86/75 4492)  Complications: No notable events documented.

## 2022-05-22 NOTE — Progress Notes (Signed)
Bladder scan completed, 28 cc.

## 2022-05-22 NOTE — Op Note (Signed)
Operative Note   Preoperative diagnosis:  1.  Obstructing left ureteral stone 2. 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 Obstructing left ureteral stone 2. 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  Bipolar transurethral resection of prostate (limited of median lobe) 2. Diagnostic left ureteroscopy 3. Left ureteral stent placement 4. Left retrograde pyelogram   Surgeon: Rexene Alberts, MD   Assistants:  None   Anesthesia:  General   Complications:  None   EBL:  56m   Specimens: 1. Prostate chips   Drains/Catheters: 1.  22Fr 3 way catheter with 526mwater into balloon   Intraoperative findings:   Very large median lobe with intravesical protrusion into the bladder measuring approximately 3.5 x 4.0 x 4.0 cm on preoperative CT scan.  This median lobe was obstructing the view of the trigone. I resected the left half of the intravesical protruding prostate with a very friable prostate and eventual excellent hemostasis. Unable to pass a dual-lumen catheter, semirigid ureteral scope or flexible ureteroscope into the distal left ureter given J hooking of the ureter, obstructing prostate and narrowed intradetrusor portion of the ureter. Left retrograde pyelogram with mild left hydronephrosis. Left ureteral stent placement attached to a short segment of a string to facilitate easier removal.   Indication:  Rickey Cruz a [age].o. male who underwent ESWL for a nonobstructing left renal stone.  He developed acute left-sided flank pain and was admitted for pain control.  He was found to have approximately 5 mm mid left ureteral stone.  His pain persisted and he opted for attempt at primary ureteroscopy with possible TURP to treat his stone.  Of note, he has a very large prostate estimated be about 100 cc with severe intravesical portion measuring 4.0 x 3.5 x 4.0 cm.   Description of  procedure: The indication, alternatives, benefits and risks were discussed with the patient and informed consent was obtained.  Patient was brought to the operating room table, positioned supine, secured with a safety strap.  Pneumatic compression devices were placed on the lower extremities.  After the administration of intravenous antibiotics and general anesthesia, the patient was repositioned into the dorsal lithotomy position.  All pressure points were carefully padded.  A rectal examination was performed confirming a smooth symmetric enlarged gland.  The genitalia were prepped and draped in standard sterile manner.  A timeout was completed, verifying the correct patient, surgical procedure and positioning prior to beginning the procedure.  Isotonic sodium chloride was used for irrigation.  A 21 French rigid cystoscope was passed into the bladder noting severe trilobar obstructing prostate with very large median lobe.  I attempted to visualize the trigone however was unable given his large intravesical protrusion of the prostate.  Thus I elected to proceed with limited transurethral section of the prostate in order to visualize his ureter.  A 26 French continuous-flow resectoscope sheath with the visual obturator and a 30 degree lens was advanced under direct vision into the bladder.  The anterior urethra appeared normal in its entirety.The prostatic urethra was elongated with trilobar hyperplasia.  On cystoscopic evaluation, his bladder capacity appeared smaller.  His median lobe was compressed against the bladder and the bladder would not expand further making the resection extremely difficult in order to avoid injury into the bladder wall.   The obturator was removed and replaced by the working element with a resection loop.  Starting at the  bladder neck I began a limited resection of his intravesical protrusion of the prostate.  With each swipe, I had to pull the prostate towards my scope in order to  create some space between the intravesical component of the prostate and the bladder wall.  This is extremely tedious.  Eventually, I was able to resect approximately half of his left side of his median lobe.  I was able to visualize the ureter.  I then obtained hemostasis.   I passed a 0.038 sensor wire up to the level of the kidney.  Over this, passed a 5 [place] open-ended catheter and performed a left retrograde pyelogram demonstrating mild left hydronephrosis.  I was unable to visualize calcification on fluoroscopy. I then attempted to pass a dual-lumen catheter however this would not bypass the level of his intradetrusor ureter.  I then attempted to pass a semirigid ureteroscope adjacent to the wire however similarly, was unable to advance this beyond the distal ureter given some J hooking of the ureter and persistent compression of his prostate.  I then switched to a cystoscopy and passed a separate 0.038 sensor wire.  Over this wire, I attempted to pass a flexible ureteroscope however similarly, this would not pass beyond the level of the distal ureter.  Thus, elected to abort attempt at primary ureteroscopy and leave a stent.  I switch for cystoscopy and passed a 6 [place] by 26 cm left ureteral stent.  Of note, I did leave a short tether string attached to the end of the stent and this remained in the bladder in order to facilitate easier removal given his intravesical protrusion of the prostate.   I then reintroduced the resectoscope and achieved excellent hemostasis over the partial resection of his prostate.   All bleeding vessels were fulgurated achieving meticulous hemostasis.  The bladder was irrigated with a Toomey syringe, ensuring removal of all prostate chips which were sent to pathology for evaluation.  Having completed the resection and the chips removed, we again confirmed hemostasis with the loop with coagulating current.  The bladder was reexamined and there is no evidence of injury  to the bladder.   The resectoscope was withdrawn under direct vision and a 22 French three-way Foley catheter with a 30 cc balloon was inserted into the bladder.  The balloon was inflated with 50 cc of sterile water.  After multiple manual irrigations ensuring light pink return of the irrigant, the procedure was terminated.  The catheter was attached to a drainage bag and continuous bladder irrigation was started with normal saline.  The patient was positioned supine.   At the end of the procedure, all counts were correct.  Patient tolerated the procedure well and was taken to the recovery room satisfactory condition.   Plan: Continuous bladder irrigation overnight.  Hopefully will discharge home tomorrow with Foley catheter in place and plan for void trial outpatient.  He still has left ureteral stone and left renal stone in place.  We could attempt delayed ureteroscopy however given his intravesical protrusion of the prostate, this would again be very difficult.  Alternatively, he could proceed with shockwave lithotripsy to the remaining ureteral stent.  I recommend that he undergo a robotic simple prostatectomy to rid him of this very large obstructing prostate.   Matt R. [facility]  Pager: [phone]

## 2022-05-22 NOTE — Anesthesia Preprocedure Evaluation (Addendum)
Anesthesia Evaluation  Patient identified by MRN, date of birth, ID band Patient awake    Reviewed: Allergy & Precautions, NPO status , Patient's Chart, lab work & pertinent test results  Airway Mallampati: III  TM Distance: >3 FB Neck ROM: Full   Comment: Surgically removed uvula Dental no notable dental hx.    Pulmonary asthma , sleep apnea (s/p UPP) ,    Pulmonary exam normal breath sounds clear to auscultation       Cardiovascular hypertension,  Rhythm:Regular Rate:Tachycardia     Neuro/Psych  Headaches, PSYCHIATRIC DISORDERS Depression    GI/Hepatic Neg liver ROS, GERD  ,  Endo/Other  prediabetes  Renal/GU Renal disease (nephrolithiasis)  negative genitourinary   Musculoskeletal  (+) Arthritis  (psoritatic),   Abdominal   Peds negative pediatric ROS (+)  Hematology negative hematology ROS (+)   Anesthesia Other Findings psoriasis  Reproductive/Obstetrics negative OB ROS                            Anesthesia Physical Anesthesia Plan  ASA: 3 and emergent  Anesthesia Plan: General   Post-op Pain Management: Tylenol PO (pre-op)*   Induction: Intravenous  PONV Risk Score and Plan: 2 and Treatment may vary due to age or medical condition, Ondansetron and Dexamethasone  Airway Management Planned: LMA  Additional Equipment: None  Intra-op Plan:   Post-operative Plan:   Informed Consent: I have reviewed the patients History and Physical, chart, labs and discussed the procedure including the risks, benefits and alternatives for the proposed anesthesia with the patient or authorized representative who has indicated his/her understanding and acceptance.     Dental advisory given  Plan Discussed with: CRNA, Anesthesiologist and Surgeon  Anesthesia Plan Comments: (Needs updated ekg)        Anesthesia Quick Evaluation

## 2022-05-22 NOTE — Anesthesia Procedure Notes (Signed)
Procedure Name: LMA Insertion Date/Time: 05/22/2022 10:32 AM Performed by: Montel Clock, CRNA Pre-anesthesia Checklist: Patient identified, Emergency Drugs available, Suction available, Patient being monitored and Timeout performed Patient Re-evaluated:Patient Re-evaluated prior to induction Oxygen Delivery Method: Circle system utilized Preoxygenation: Pre-oxygenation with 100% oxygen Induction Type: IV induction Ventilation: Mask ventilation without difficulty LMA: LMA with gastric port inserted LMA Size: 4.0 Number of attempts: 1 Dental Injury: Teeth and Oropharynx as per pre-operative assessment

## 2022-05-23 ENCOUNTER — Encounter (HOSPITAL_COMMUNITY): Payer: Self-pay | Admitting: Urology

## 2022-05-23 DIAGNOSIS — R52 Pain, unspecified: Secondary | ICD-10-CM | POA: Diagnosis not present

## 2022-05-23 LAB — CBC WITH DIFFERENTIAL/PLATELET
Abs Immature Granulocytes: 0.04 10*3/uL (ref 0.00–0.07)
Basophils Absolute: 0 10*3/uL (ref 0.0–0.1)
Basophils Relative: 0 %
Eosinophils Absolute: 0 10*3/uL (ref 0.0–0.5)
Eosinophils Relative: 0 %
HCT: 43.2 % (ref 39.0–52.0)
Hemoglobin: 14.6 g/dL (ref 13.0–17.0)
Immature Granulocytes: 1 %
Lymphocytes Relative: 6 %
Lymphs Abs: 0.5 10*3/uL — ABNORMAL LOW (ref 0.7–4.0)
MCH: 30.2 pg (ref 26.0–34.0)
MCHC: 33.8 g/dL (ref 30.0–36.0)
MCV: 89.4 fL (ref 80.0–100.0)
Monocytes Absolute: 0.6 10*3/uL (ref 0.1–1.0)
Monocytes Relative: 7 %
Neutro Abs: 7.2 10*3/uL (ref 1.7–7.7)
Neutrophils Relative %: 86 %
Platelets: 208 10*3/uL (ref 150–400)
RBC: 4.83 MIL/uL (ref 4.22–5.81)
RDW: 12 % (ref 11.5–15.5)
WBC: 8.3 10*3/uL (ref 4.0–10.5)
nRBC: 0 % (ref 0.0–0.2)

## 2022-05-23 LAB — BASIC METABOLIC PANEL
Anion gap: 8 (ref 5–15)
BUN: 13 mg/dL (ref 6–20)
CO2: 27 mmol/L (ref 22–32)
Calcium: 8.6 mg/dL — ABNORMAL LOW (ref 8.9–10.3)
Chloride: 102 mmol/L (ref 98–111)
Creatinine, Ser: 1.11 mg/dL (ref 0.61–1.24)
GFR, Estimated: >60 mL/min (ref >60)
Glucose, Bld: 145 mg/dL — ABNORMAL HIGH (ref 70–99)
Potassium: 3.6 mmol/L (ref 3.5–5.1)
Sodium: 137 mmol/L (ref 135–145)

## 2022-05-23 MED ORDER — LACTATED RINGERS IV SOLN: INTRAVENOUS | Status: DC

## 2022-05-23 NOTE — TOC Initial Note (Signed)
Transition of Care [facility]) - Initial/Assessment Note    Patient Details  Name: Rickey Cruz MRN: [id number] Date of Birth: January 11, 1961  Transition of Care [facility]) CM/SW Contact:    Leeroy Cha, RN Phone Number: 05/23/2022, 7:26 AM  Clinical Narrative:                  Transition of Care [facility]) Screening Note   Patient Details  Name: Rickey Cruz Date of Birth: 1961/02/07   Transition of Care [facility]) CM/SW Contact:    Leeroy Cha, RN Phone Number: 05/23/2022, 7:26 AM    Transition of Care Department [facility]) has reviewed patient and no TOC needs have been identified at this time. We will continue to monitor patient advancement through interdisciplinary progression rounds. If new patient transition needs arise, please place a TOC consult.    Expected Discharge Plan: Home/Self Care Barriers to Discharge: No Barriers Identified   Patient Goals and CMS Choice Patient states their goals for this hospitalization and ongoing recovery are:: to go home CMS Medicare.gov Compare Post Acute Care list provided to:: Patient Choice offered to / list presented to : Patient  Expected Discharge Plan and Services Expected Discharge Plan: Home/Self Care   Discharge Planning Services: CM Consult   Living arrangements for the past 2 months: Apartment                                      Prior Living Arrangements/Services Living arrangements for the past 2 months: Apartment Lives with:: Self Patient language and need for interpreter reviewed:: Yes Do you feel safe going back to the place where you live?: Yes            Criminal Activity/Legal Involvement Pertinent to Current Situation/Hospitalization: No - Comment as needed  Activities of Daily Living Home Assistive Devices/Equipment: None ADL Screening (condition at time of admission) Patient's cognitive ability adequate to safely complete daily activities?: Yes Is the patient deaf or have difficulty hearing?: No Does  the patient have difficulty seeing, even when wearing glasses/contacts?: No Does the patient have difficulty concentrating, remembering, or making decisions?: No Patient able to express need for assistance with ADLs?: Yes Does the patient have difficulty dressing or bathing?: No Independently performs ADLs?: Yes (appropriate for developmental age) Does the patient have difficulty walking or climbing stairs?: No Weakness of Legs: None Weakness of Arms/Hands: None  Permission Sought/Granted                  Emotional Assessment Appearance:: Appears stated age     Orientation: : Oriented to Self, Oriented to Place, Oriented to  Time, Oriented to Situation Alcohol / Substance Use: Not Applicable Psych Involvement: No (comment)  Admission diagnosis:  Kidney stone [N20.0] Intractable pain [R52] Patient Active Problem List   Diagnosis Date Noted   Intractable pain 05/21/2022   Obstructive uropathy 05/21/2022   AKI (acute kidney injury) ([place]) 05/21/2022   Moderate persistent asthma with acute exacerbation 04/28/2022   Allergic rhinitis 04/28/2022   Back pain 04/28/2022   DOE (dyspnea on exertion) 04/28/2022   History of prostate cancer 04/25/2022   History of kidney stones 04/25/2022   Rosacea 04/19/2022   Malignant neoplasm of prostate ([place]) 04/19/2022   Enlarged prostate 01/12/2022   Neck pain 08/10/2021   Jammed interphalangeal joint of finger of left hand 03/10/2021   Closed nondisplaced fracture of fifth metacarpal bone of right hand 03/10/2021  Closed nondisplaced fracture of shaft of fourth metacarpal bone of right hand 03/10/2021   Prediabetes 04/02/2019   HLD (hyperlipidemia) 12/23/2014   Psoriasis 06/27/2014   Multiple lung nodules 12/09/2011   Musculoskeletal back pain 06/10/2010   PULMONARY HYPERTENSION 03/04/2010   Essential hypertension 02/10/2010   OBSTRUCTIVE SLEEP APNEA 02/04/2010   PCP:  Horald Pollen, MD Pharmacy:   [facility]  [place], [place] [address] [facility] [address] [place] [address] Phone: [phone] Fax: [phone]     Social Determinants of Health (SDOH) Interventions    Readmission Risk Interventions     View : No data to display.

## 2022-05-23 NOTE — Progress Notes (Addendum)
1 Day Post-Op Subjective: Denies pain. No nausea or emesis. Tolerating foley. Foley clear on slow drip.  Objective: Vital signs in last 24 hours: Temp:  [97.5 F (36.4 C)-98.2 F (36.8 C)] 98 F (36.7 C) (05/29 0815) Pulse Rate:  [58-81] 59 (05/29 0815) Resp:  [8-20] 16 (05/29 0815) BP: (126-158)/(87-109) 126/90 (05/29 0815) SpO2:  [95 %-100 %] 95 % (05/29 0815)  Intake/Output from previous day: 05/28 0701 - 05/29 0700 In: 26980.1 [P.O.:340; I.V.:1740.1] Out: 20441 [Urine:20390; Stool:1; Blood:50] Intake/Output this shift: Total I/O In: 990.5 [I.V.:990.5] Out: 750 [Urine:750]  Physical Exam:  General: Alert and oriented CV: RRR Lungs: Clear Abdomen: Soft, ND, NT Ext: NT, No erythema  Lab Results: Recent Labs    05/21/22 0240 05/22/22 0329 05/23/22 0313  HGB 15.7 13.9 14.6  HCT 45.4 40.1 43.2   BMET Recent Labs    05/22/22 0329 05/23/22 0313  NA 137 137  K 3.6 3.6  CL 104 102  CO2 27 27  GLUCOSE 110* 145*  BUN 11 13  CREATININE 1.02 1.11  CALCIUM 8.3* 8.6*     Studies/Results: DG C-Arm 1-60 Min-No Report  Result Date: 05/22/2022 Fluoroscopy was utilized by the requesting physician.  No radiographic interpretation.   DG C-Arm 1-60 Min-No Report  Result Date: 05/22/2022 Fluoroscopy was utilized by the requesting physician.  No radiographic interpretation.    Assessment/Plan: Obstructing left ureteral stone measuring 5 mm: S/p left ESWL of a left lower pole stone on 05/09/2022.  Also present is a 4 mm left lower pole stone. S/p limited TURP of median lobe, cysto, L RPG, L stent 5/28 BPH with large median lobe   -Foley clear on slow drip. Clamped, still clear and removed foley. Void trial.  -Continue finasteride, flomax -Will arrange f/u with Dr. Claudia Desanctis outpatient -Ok to discharge home if passes void trial  Addendum 1:25PM: Pt failed VT, had 18 Fr foley replaced with return of dark pink, one small clots. Will keep in house with manual irrigation. May  need to upsize again on CBI if persists.    LOS: 1 day   Matt R. Latysha Thackston MD 05/23/2022, 10:18 AM [facility]  Pager: [phone]

## 2022-05-23 NOTE — Progress Notes (Signed)
PROGRESS NOTE  Rickey Cruz UDJ:[id number] DOB: February 28, 1961 DOA: 05/21/2022 PCP: Horald Pollen, MD  HPI/Recap of past 24 hours: Rickey Cruz is a 61 y.o. male with medical history significant of HTN, HLD, asthma, nephrolithiasis, followed by urology.  He had a shockwave lithotripsy procedure performed on 05/09/22.  A few days later he had acute N/V, and left flank pain.  Went to urgent care where he was told he had some retained kidney stones. He was sent to the ED for further evaluation. There he improved, was given percocet, zofran, and was discharged home.  3 days later he returned with more intense left flank pain not relieved by home Percocet.  The pain was associated with nausea and vomiting.  He was admitted at [facility] and seen by urology.  Status post left ureteral stent placement, left retrograde pyelogram, and post limited TURP of median lobe.    He was also started on CBI.  His Foley catheter was removed on 05/23/2022 for voiding trial.  However, he failed voiding trial.  An 87 F. Foley was replaced with return of dark pink urine with 1 small clot.  Assessment/Plan: Principal Problem:   Intractable pain Active Problems:   Essential hypertension   HLD (hyperlipidemia)   Prediabetes   Moderate persistent asthma with acute exacerbation   Obstructive uropathy   AKI (acute kidney injury) ([place])  Obstructive uropathy secondary to obstructing nephrolithiasis status post left ureteral stent placement/post limited TURP of median lobe.  Ongoing CBI, Foley catheter in place Appreciate urology's assistance. Management per urology Analgesics as needed  Resolved AKI in the setting of obstructive uropathy Creatinine 1.53 on 05/21/2022 Renal function back to baseline with creatinine of 1.11 with GFR greater than 60. We will start gentle IV fluid hydration LR at 50 cc/h x 2 days Monitor urine output, also on CBI Monitor volume status while on CBI  Acute urinary  retention Foley catheter was removed on 05/23/2022, failed voiding trial Foley catheter replaced on 05/23/2022 Management per urology  Prediabetes Hemoglobin A1c 6.3 on 05/22/2022 Managed with healthy dieting  Isolated elevated ALT ALT 54, nonspecific. Repeat chemistry panel in the morning.  Essential hypertension BP stable and at goal Resume home regimen  BPH Resume home regimen  Hyperlipidemia Resume home regimen     Code Status: Full code  Family Communication: Wife at bedside  Disposition Plan: We will discharge once neurology signs of   Consultants: Urology  Procedures: Foley catheter/left ureteral stent placement  Antimicrobials: None.  DVT prophylaxis: SCDs  Status is: Inpatient Patient requires at least 2 midnights for further evaluation and treatment of present condition.    Objective: Vitals:   05/22/22 2026 05/22/22 2229 05/23/22 0534 05/23/22 0815  BP: (!) 154/103 (!) 137/99 130/87 126/90  Pulse: 72 (!) 58 (!) 58 (!) 59  Resp: '18  20 16  '$ Temp: 98.2 F (36.8 C) 98.2 F (36.8 C) 98 F (36.7 C) 98 F (36.7 C)  TempSrc: Oral Oral Oral Oral  SpO2: 95% 95% 95% 95%  Weight:      Height:        Intake/Output Summary (Last 24 hours) at 05/23/2022 1614 Last data filed at 05/23/2022 1525 Gross per 24 hour  Intake 19990.5 ml  Output 13841 ml  Net 6149.5 ml   Filed Weights   05/21/22 0153 05/21/22 1308  Weight: 83.9 kg 83.1 kg    Exam:  General: 61 y.o. year-old male well developed well nourished in no acute distress.  Alert  and oriented x3. Cardiovascular: Regular rate and rhythm with no rubs or gallops.  No thyromegaly or JVD noted.   Respiratory: Clear to auscultation with no wheezes or rales. Good inspiratory effort. Abdomen: Soft nontender nondistended with normal bowel sounds x4 quadrants. Musculoskeletal: No lower extremity edema. 2/4 pulses in all 4 extremities. Skin: No ulcerative lesions noted or rashes, Psychiatry: Mood is  appropriate for condition and setting   Data Reviewed: CBC: Recent Labs  Lab 05/18/22 1254 05/18/22 1512 05/21/22 0240 05/22/22 0329 05/23/22 0313  WBC 7.5 7.6 8.9 4.9 8.3  NEUTROABS 6.2 6.4  --   --  7.2  HGB 16.3 17.0 15.7 13.9 14.6  HCT 45.4 48.7 45.4 40.1 43.2  MCV 86.8 87.9 88.5 89.9 89.4  PLT 226 235 226 172 381   Basic Metabolic Panel: Recent Labs  Lab 05/18/22 1254 05/18/22 1512 05/21/22 0240 05/22/22 0329 05/23/22 0313  NA 138 140 140 137 137  K 3.4* 3.5 4.0 3.6 3.6  CL 106 108 110 104 102  CO2 23 22 21* 27 27  GLUCOSE 137* 123* 185* 110* 145*  BUN '13 16 19 11 13  '$ CREATININE 1.02 1.04 1.53* 1.02 1.11  CALCIUM 8.8* 9.1 9.0 8.3* 8.6*   GFR: Estimated Creatinine Clearance: 71.6 mL/min (by C-G formula based on SCr of 1.11 mg/dL). Liver Function Tests: Recent Labs  Lab 05/18/22 1254 05/18/22 1512 05/22/22 0329  AST 22 24 36  ALT 33 35 54*  ALKPHOS 46 53 49  BILITOT 1.0 1.0 0.7  PROT 6.9 7.8 5.8*  ALBUMIN 4.0 4.5 3.2*   No results for input(s): LIPASE, AMYLASE in the last 168 hours. No results for input(s): AMMONIA in the last 168 hours. Coagulation Profile: No results for input(s): INR, PROTIME in the last 168 hours. Cardiac Enzymes: No results for input(s): CKTOTAL, CKMB, CKMBINDEX, TROPONINI in the last 168 hours. BNP (last 3 results) No results for input(s): PROBNP in the last 8760 hours. HbA1C: Recent Labs    05/22/22 0329  HGBA1C 6.3*   CBG: No results for input(s): GLUCAP in the last 168 hours. Lipid Profile: No results for input(s): CHOL, HDL, LDLCALC, TRIG, CHOLHDL, LDLDIRECT in the last 72 hours. Thyroid Function Tests: No results for input(s): TSH, T4TOTAL, FREET4, T3FREE, THYROIDAB in the last 72 hours. Anemia Panel: No results for input(s): VITAMINB12, FOLATE, FERRITIN, TIBC, IRON, RETICCTPCT in the last 72 hours. Urine analysis:    Component Value Date/Time   COLORURINE YELLOW 05/21/2022 0458   APPEARANCEUR HAZY (A)  05/21/2022 0458   APPEARANCEUR Clear 11/01/2018 1112   LABSPEC 1.011 05/21/2022 0458   PHURINE 8.0 05/21/2022 0458   GLUCOSEU NEGATIVE 05/21/2022 0458   GLUCOSEU NEGATIVE 01/12/2022 1431   HGBUR SMALL (A) 05/21/2022 0458   BILIRUBINUR NEGATIVE 05/21/2022 0458   BILIRUBINUR Negative 11/01/2018 1112   KETONESUR NEGATIVE 05/21/2022 0458   PROTEINUR NEGATIVE 05/21/2022 0458   UROBILINOGEN 0.2 05/18/2022 1343   NITRITE NEGATIVE 05/21/2022 0458   LEUKOCYTESUR NEGATIVE 05/21/2022 0458   Sepsis Labs: '@LABRCNTIP'$ (procalcitonin:4,lacticidven:4)  ) Recent Results (from the past 240 hour(s))  Urine Culture     Status: None   Collection Time: 05/18/22  2:00 PM   Specimen: Urine, Clean Catch  Result Value Ref Range Status   Specimen Description URINE, CLEAN CATCH  Final   Special Requests NONE  Final   Culture   Final    NO GROWTH Performed at [facility], [place] [address] [place], [place] [address]    Report Status 05/19/2022  FINAL  Final      Studies: No results found.  Scheduled Meds:  amLODipine  5 mg Oral Daily   Chlorhexidine Gluconate Cloth  6 each Topical Daily   docusate sodium  100 mg Oral BID   finasteride  5 mg Oral Daily   lisinopril  40 mg Oral Daily   oxybutynin  5 mg Oral TID   polyethylene glycol  17 g Oral Daily   rosuvastatin  10 mg Oral Daily   tamsulosin  0.4 mg Oral Daily    Continuous Infusions:  lactated ringers 75 mL/hr at 05/23/22 1229     LOS: 1 day     Kayleen Memos, MD [facility] Pager [phone]  If 7PM-7AM, please contact night-coverage [email or web address] Password [facility] 05/23/2022, 4:14 PM

## 2022-05-24 DIAGNOSIS — R52 Pain, unspecified: Secondary | ICD-10-CM | POA: Diagnosis not present

## 2022-05-24 LAB — CBC
HCT: 39.2 % (ref 39.0–52.0)
Hemoglobin: 13.3 g/dL (ref 13.0–17.0)
MCH: 30.2 pg (ref 26.0–34.0)
MCHC: 33.9 g/dL (ref 30.0–36.0)
MCV: 89.1 fL (ref 80.0–100.0)
Platelets: 179 10*3/uL (ref 150–400)
RBC: 4.4 MIL/uL (ref 4.22–5.81)
RDW: 12.1 % (ref 11.5–15.5)
WBC: 7.9 10*3/uL (ref 4.0–10.5)
nRBC: 0 % (ref 0.0–0.2)

## 2022-05-24 LAB — RENAL FUNCTION PANEL
Albumin: 3.3 g/dL — ABNORMAL LOW (ref 3.5–5.0)
Anion gap: 8 (ref 5–15)
BUN: 14 mg/dL (ref 6–20)
CO2: 28 mmol/L (ref 22–32)
Calcium: 8.2 mg/dL — ABNORMAL LOW (ref 8.9–10.3)
Chloride: 102 mmol/L (ref 98–111)
Creatinine, Ser: 0.9 mg/dL (ref 0.61–1.24)
GFR, Estimated: >60 mL/min (ref >60)
Glucose, Bld: 124 mg/dL — ABNORMAL HIGH (ref 70–99)
Phosphorus: 3.1 mg/dL (ref 2.5–4.6)
Potassium: 3.4 mmol/L — ABNORMAL LOW (ref 3.5–5.1)
Sodium: 138 mmol/L (ref 135–145)

## 2022-05-24 LAB — SURGICAL PATHOLOGY

## 2022-05-24 LAB — HEPATIC FUNCTION PANEL
ALT: 34 U/L (ref 0–44)
AST: 23 U/L (ref 15–41)
Albumin: 3.3 g/dL — ABNORMAL LOW (ref 3.5–5.0)
Alkaline Phosphatase: 51 U/L (ref 38–126)
Bilirubin, Direct: 0.2 mg/dL (ref 0.0–0.2)
Indirect Bilirubin: 1 mg/dL — ABNORMAL HIGH (ref 0.3–0.9)
Total Bilirubin: 1.2 mg/dL (ref 0.3–1.2)
Total Protein: 6 g/dL — ABNORMAL LOW (ref 6.5–8.1)

## 2022-05-24 MED ORDER — OXYBUTYNIN CHLORIDE 5 MG PO TABS: 5.0000 mg | ORAL_TABLET | Freq: Three times a day (TID) | ORAL | 0 refills | Status: DC | PRN

## 2022-05-24 MED ORDER — SIMETHICONE 80 MG PO CHEW
80.0000 mg | CHEWABLE_TABLET | Freq: Once | ORAL | Status: AC
  Administered 2022-05-24: 80 mg via ORAL
  Filled 2022-05-24: qty 1

## 2022-05-24 MED ORDER — TAMSULOSIN HCL 0.4 MG PO CAPS: 0.4000 mg | ORAL_CAPSULE | Freq: Every day | ORAL | 1 refills | Status: DC

## 2022-05-24 MED ORDER — FINASTERIDE 5 MG PO TABS: 5.0000 mg | ORAL_TABLET | Freq: Every day | ORAL | 1 refills | Status: DC

## 2022-05-24 NOTE — Discharge Summary (Signed)
Physician Discharge Summary   Patient: Rickey Cruz MRN: [id number] DOB: [birth year]  Admit date:     05/21/2022  Discharge date: 05/24/22  Discharge Physician: Lorella Nimrod   PCP: Horald Pollen, MD   Recommendations at discharge:  Please obtain CBC during next follow-up appointment with urology. Follow-up with urology  Discharge Diagnoses: Principal Problem:   Intractable pain Active Problems:   Essential hypertension   HLD (hyperlipidemia)   Prediabetes   Moderate persistent asthma with acute exacerbation   Obstructive uropathy   AKI (acute kidney injury) [facility])  Hospital Course: Rickey Cruz is a 61 y.o. male with medical history significant of HTN, HLD, asthma, nephrolithiasis, followed by urology.  He had a shockwave lithotripsy procedure performed on 05/09/22.  A few days later he had acute N/V, and left flank pain.  Went to urgent care where he was told he had some retained kidney stones. He was sent to the ED for further evaluation. There he improved, was given percocet, zofran, and was discharged home.  3 days later he returned with more intense left flank pain not relieved by home Percocet.  The pain was associated with nausea and vomiting.  He was admitted at [facility] and seen by urology.  Status post left ureteral stent placement, left retrograde pyelogram, and post limited TURP of median lobe.     He was also started on CBI.  His Foley catheter was removed on 05/23/2022 for voiding trial.  However, he failed voiding trial.  An 3 F. Foley was replaced with return of dark pink urine with 1 small clot.  Patient received CBI for more than 24 hours.  Urine started clearing out but remained light pink on discharge.  Urology also started him on oxybutynin and Flomax along with finasteride. Patient is being discharged home once cleared from urology and they will follow-up closely as an outpatient.  Patient had AKI on admission most likely secondary to obstructive  uropathy which was resolved before discharge.  Patient was also found to be prediabetic with hemoglobin of 6.3.  He will need further management and education from PCP.  Patient will continue his current medications and will follow-up with his providers.   Consultants: Urology Procedures performed: TURP and left ureteral stent placement Disposition: Home Diet recommendation:  Discharge Diet Orders (From admission, onward)     Start     Ordered   05/24/22 0000  Diet - low sodium heart healthy        05/24/22 1238           Carb modified diet DISCHARGE MEDICATION: Allergies as of 05/24/2022   No Known Allergies      Medication List     STOP taking these medications    clobetasol 0.05 % topical foam Commonly known as: OLUX   clobetasol ointment 0.05 % Commonly known as: TEMOVATE   fluocinolone 0.01 % cream Commonly known as: VANOS   fluocinonide cream 0.05 % Commonly known as: LIDEX   fluticasone 50 MCG/ACT nasal spray Commonly known as: FLONASE   HYDROcodone-acetaminophen 5-325 MG tablet Commonly known as: NORCO/VICODIN   hydrocortisone 2.5 % rectal cream Commonly known as: ANUSOL-HC   ibuprofen 600 MG tablet Commonly known as: ADVIL   loratadine 10 MG tablet Commonly known as: Claritin   montelukast 10 MG tablet Commonly known as: SINGULAIR       TAKE these medications    albuterol 108 (90 Base) MCG/ACT inhaler Commonly known as: VENTOLIN HFA INHALE 2 PUFFS INTO THE  LUNGS EVERY 4 HOURS AS NEEDED FOR WHEEZING OR SHORTNESS OF BREATH(COUGHING FITS)   amLODipine 5 MG tablet Commonly known as: NORVASC TAKE 1 TABLET(5 MG) BY MOUTH DAILY   Breztri Aerosphere 160-9-4.8 MCG/ACT Aero Generic drug: Budeson-Glycopyrrol-Formoterol Inhale two puffs with spacer twice daily to prevent cough or wheeze.  Rinse, gargle, and spit after use. What changed:  how much to take how to take this when to take this reasons to take this additional instructions    finasteride 5 MG tablet Commonly known as: PROSCAR Take 1 tablet (5 mg total) by mouth daily. Start taking on: May 25, 2022   lisinopril 40 MG tablet Commonly known as: ZESTRIL Take 1 tablet (40 mg total) by mouth daily.   ondansetron 4 MG disintegrating tablet Commonly known as: ZOFRAN-ODT '4mg'$  ODT q4 hours prn nausea/vomit What changed:  how much to take how to take this when to take this reasons to take this additional instructions   oxybutynin 5 MG tablet Commonly known as: DITROPAN Take 1 tablet (5 mg total) by mouth every 8 (eight) hours as needed for bladder spasms.   oxyCODONE-acetaminophen 5-325 MG tablet Commonly known as: PERCOCET/ROXICET Take 1 tablet by mouth every 6 (six) hours as needed for severe pain.   rosuvastatin 10 MG tablet Commonly known as: CRESTOR TAKE 1 TABLET(10 MG) BY MOUTH DAILY   tamsulosin 0.4 MG Caps capsule Commonly known as: FLOMAX Take 1 capsule (0.4 mg total) by mouth at bedtime.        Follow-up Information     Horald Pollen, MD. Schedule an appointment as soon as possible for a visit in 1 week(s).   Specialty: Internal Medicine Contact information: [place] [address] [phone]                Discharge Exam: [place] Weights   05/21/22 0153 05/21/22 1308  Weight: 83.9 kg 83.1 kg   General.     In no acute distress. Pulmonary.  Lungs clear bilaterally, normal respiratory effort. CV.  Regular rate and rhythm, no JVD, rub or murmur. Abdomen.  Soft, nontender, nondistended, BS positive. CNS.  Alert and oriented .  No focal neurologic deficit. Extremities.  No edema, no cyanosis, pulses intact and symmetrical. Psychiatry.  Judgment and insight appears normal.   Condition at discharge: stable  The results of significant diagnostics from this hospitalization (including imaging, microbiology, ancillary and laboratory) are listed below for reference.   Imaging Studies: DG Chest 2  View  Result Date: 04/28/2022 CLINICAL DATA:  Productive cough.  Shortness of breath. EXAM: CHEST - 2 VIEW COMPARISON:  Chest two views 10/31/2021 FINDINGS: Cardiac silhouette and mediastinal contours are within normal limits. Mild calcification within aortic arch. The lungs are clear. No pleural effusion or pneumothorax. Mild multilevel degenerative disc changes of the thoracic spine. Cholecystectomy clips. IMPRESSION: No active cardiopulmonary disease. Electronically Signed   By: Yvonne Kendall M.D.   On: 04/28/2022 10:19   DG Abdomen 1 View  Result Date: 05/18/2022 CLINICAL DATA:  Left flank pain.  Recent lithotripsy EXAM: ABDOMEN - 1 VIEW COMPARISON:  05/09/2022 FINDINGS: Two small calculi overlying the left lower pole. These likely are fragmented pieces of the slightly larger stone seen on the prior study. No ureteral stone. No right renal calculi Phleboliths in left pelvis unchanged. IMPRESSION: Left renal calculus appears fragmented, now overlying the left lower pole. No ureteral calculus identified. Electronically Signed   By: Franchot Gallo M.D.   On: 05/18/2022 13:28  DG Abd 1 View  Result Date: 05/10/2022 CLINICAL DATA:  Left-sided flank pain.  Patient for lithotripsy. EXAM: ABDOMEN - 1 VIEW COMPARISON:  Abdomen 05/04/2022. Renal ultrasound 04/29/2022. CT 09/20/2021. FINDINGS: Surgical clips noted over the abdomen. Stool noted throughout the colon. No bowel distention. 5 mm left renal stone again noted in similar position. Calcific densities over the right kidney cannot be completely excluded. This may represent overlying bowel/stool. Pelvic calcifications appear stable and are most likely phleboliths. Degenerative change lumbar spine and both hips. Tiny sclerotic density again noted over the right ilium, most likely a benign bone island. IMPRESSION: 1.  5 mm left renal stone again noted in similar position. 2. Calcific densities in the right kidney cannot be completely excluded. These may  represent overlying bowel/stool. Pelvic calcifications appear stable and are most likely phleboliths. Electronically Signed   By: Marcello Moores  Register M.D.   On: 05/10/2022 09:12   US Renal  Result Date: 05/01/2022 CLINICAL DATA:  Left kidney stone. EXAM: RENAL / URINARY TRACT ULTRASOUND COMPLETE COMPARISON:  CT abdomen and pelvis 09/09/2019 FINDINGS: Right Kidney: Renal measurements: 10.5 x 6.0 x 5.8 cm = volume: 192 mL. Echogenicity within normal limits. No mass or hydronephrosis visualized. Left Kidney: Renal measurements: 12.7 x 5.3 x 6.0 cm = volume: 207 mL. Echogenicity within normal limits. There are 2 calculi scratch at there are 3 calculi visualized measuring up to 1 cm in the mid and lower pole of the kidney. No mass or hydronephrosis visualized. Bladder: Appears normal for degree of bladder distention. There is nodular protrusion of the prostate gland into the bladder base. Prostate gland is enlarged measuring 8.4 x 5.1 x 6.7 cm. Appearance is similar to prior. Other: None. IMPRESSION: 1. Nonobstructing left renal calculi. 2. Unchanged nodular protrusion of the prostate into the bladder base. Recommend clinical correlation and follow-up Electronically Signed   By: Ronney Asters M.D.   On: 05/01/2022 00:03   DG C-Arm 1-60 Min-No Report  Result Date: 05/22/2022 Fluoroscopy was utilized by the requesting physician.  No radiographic interpretation.   DG C-Arm 1-60 Min-No Report  Result Date: 05/22/2022 Fluoroscopy was utilized by the requesting physician.  No radiographic interpretation.   CT RENAL STONE STUDY  Result Date: 05/21/2022 CLINICAL DATA:  Nephrolithiasis, back pain. EXAM: CT ABDOMEN AND PELVIS WITHOUT CONTRAST TECHNIQUE: Multidetector CT imaging of the abdomen and pelvis was performed following the standard protocol without IV contrast. RADIATION DOSE REDUCTION: This exam was performed according to the departmental dose-optimization program which includes automated exposure control,  adjustment of the mA and/or kV according to patient size and/or use of iterative reconstruction technique. COMPARISON:  09/20/2021. FINDINGS: Lower chest: Scattered coronary artery calcifications are noted. Dependent atelectasis is noted bilaterally. A stable 4 mm nodule is present in the left lower lobe, axial image 38. Hepatobiliary: No focal liver abnormality is seen. Status post cholecystectomy. No biliary dilatation. Pancreas: Unremarkable. No pancreatic ductal dilatation or surrounding inflammatory changes. Spleen: Normal in size without focal abnormality. Adrenals/Urinary Tract: The adrenal glands are within normal limits. A nonobstructive calculus is present in the lower pole the left kidney. A 4 x 6 mm stone is present in the mid left ureter resulting in mild hydronephrosis. Perinephric and periureteral fat stranding are noted on the left. No obstructive uropathy on the right. Bladder is unremarkable. Stomach/Bowel: Stomach is within normal limits. Appendix is surgically absent. No evidence of bowel wall thickening, distention, or inflammatory changes. No free air or pneumatosis. Vascular/Lymphatic: Aortic atherosclerosis. No enlarged abdominal  or pelvic lymph nodes. Reproductive: The prostate gland is enlarged with a nodular contour exerting mass effect on the base of the urinary bladder. Other: Small fat containing inguinal hernias bilaterally. No ascites. Musculoskeletal: Mild degenerative changes in the thoracolumbar spine. No acute osseous abnormality. IMPRESSION: 1. Mild obstructive uropathy on the left with a 6 x 4 mm calculus in the mid left ureter. 2. Nonobstructive left renal calculus. 3. Enlarged prostate gland. 4. Aortic atherosclerosis. 5. Stable 4 mm nodule in the left lower lobe. No follow-up recommended. This recommendation follows the consensus statement: Guidelines for Management of Incidental Pulmonary Nodules Detected on CT Images: From the Fleischner Society 2017; Radiology 2017;  284:228-243. Electronically Signed   By: Brett Fairy M.D.   On: 05/21/2022 03:31    Microbiology: Results for orders placed or performed during the hospital encounter of 05/18/22  Urine Culture     Status: None   Collection Time: 05/18/22  2:00 PM   Specimen: Urine, Clean Catch  Result Value Ref Range Status   Specimen Description URINE, CLEAN CATCH  Final   Special Requests NONE  Final   Culture   Final    NO GROWTH Performed at [facility], [place] [address] [place], [place] [address]    Report Status 05/19/2022 FINAL  Final    Labs: CBC: Recent Labs  Lab 05/18/22 1254 05/18/22 1512 05/21/22 0240 05/22/22 0329 05/23/22 0313 05/24/22 0304  WBC 7.5 7.6 8.9 4.9 8.3 7.9  NEUTROABS 6.2 6.4  --   --  7.2  --   HGB 16.3 17.0 15.7 13.9 14.6 13.3  HCT 45.4 48.7 45.4 40.1 43.2 39.2  MCV 86.8 87.9 88.5 89.9 89.4 89.1  PLT 226 235 226 172 208 832   Basic Metabolic Panel: Recent Labs  Lab 05/18/22 1512 05/21/22 0240 05/22/22 0329 05/23/22 0313 05/24/22 0304  NA 140 140 137 137 138  K 3.5 4.0 3.6 3.6 3.4*  CL 108 110 104 102 102  CO2 22 21* '27 27 28  '$ GLUCOSE 123* 185* 110* 145* 124*  BUN '16 19 11 13 14  '$ CREATININE 1.04 1.53* 1.02 1.11 0.90  CALCIUM 9.1 9.0 8.3* 8.6* 8.2*  PHOS  --   --   --   --  3.1   Liver Function Tests: Recent Labs  Lab 05/18/22 1254 05/18/22 1512 05/22/22 0329 05/24/22 0304  AST 22 24 36 23  ALT 33 35 54* 34  ALKPHOS 46 53 49 51  BILITOT 1.0 1.0 0.7 1.2  PROT 6.9 7.8 5.8* 6.0*  ALBUMIN 4.0 4.5 3.2* 3.3*  3.3*   CBG: No results for input(s): GLUCAP in the last 168 hours.  Discharge time spent: greater than 30 minutes.  This record has been created using Systems analyst. Errors have been sought and corrected,but may not always be located. Such creation errors do not reflect on the standard of care.   Signed: Lorella Nimrod, MD [facility] 05/24/2022

## 2022-05-24 NOTE — Plan of Care (Signed)

## 2022-05-24 NOTE — Progress Notes (Signed)
Notified on call provider due to patient having bloating and distension. On call provider gave new order.

## 2022-05-24 NOTE — Progress Notes (Signed)
Urology Inpatient Progress Report  Kidney stone [N20.0] Intractable pain [R52]  Procedure(s): CYSTOSCOPY WITH RETROGRADE PYELOGRAM, URETEROSCOPY AND STENT PLACEMENT; TRANSURETHRAL RESECTION OF PROSTATE  2 Days Post-Op   Intv/Subj: Patient failed void trial yesterday.  CBI stopped.  He is having bladder spasms and flank pain associated with stent.    Principal Problem:   Intractable pain Active Problems:   Essential hypertension   HLD (hyperlipidemia)   Prediabetes   Moderate persistent asthma with acute exacerbation   Obstructive uropathy   AKI (acute kidney injury) ([facility])  Current Facility-Administered Medications  Medication Dose Route Frequency Provider Last Rate Last Admin   amLODipine (NORVASC) tablet 5 mg  5 mg Oral Daily Spongberg, Audie Pinto, MD   5 mg at 05/23/22 0920   bisacodyl (DULCOLAX) suppository 10 mg  10 mg Rectal Daily PRN Janith Lima, MD   10 mg at 05/22/22 1618   Chlorhexidine Gluconate Cloth 2 % PADS 6 each  6 each Topical Daily Marcell Anger, MD   6 each at 05/23/22 6734   docusate sodium (COLACE) capsule 100 mg  100 mg Oral BID Janith Lima, MD   100 mg at 05/23/22 2236   finasteride (PROSCAR) tablet 5 mg  5 mg Oral Daily Janith Lima, MD   5 mg at 05/23/22 1937   HYDROmorphone (DILAUDID) injection 1 mg  1 mg Intravenous Q2H PRN Marcell Anger, MD   1 mg at 05/24/22 0104   lactated ringers infusion   Intravenous Continuous Kayleen Memos, DO 50 mL/hr at 05/23/22 2117 New Bag at 05/23/22 2117   lisinopril (ZESTRIL) tablet 40 mg  40 mg Oral Daily Marcell Anger, MD   40 mg at 05/23/22 0921   oxybutynin (DITROPAN) tablet 5 mg  5 mg Oral TID Janith Lima, MD   5 mg at 05/23/22 2236   polyethylene glycol (MIRALAX / GLYCOLAX) packet 17 g  17 g Oral Daily Janith Lima, MD   17 g at 05/23/22 9024   prochlorperazine (COMPAZINE) injection 10 mg  10 mg Intravenous Q6H PRN Marylyn Ishihara, Tyrone A, DO   10 mg at 05/22/22 2104    rosuvastatin (CRESTOR) tablet 10 mg  10 mg Oral Daily Marcell Anger, MD   10 mg at 05/23/22 0921   tamsulosin (FLOMAX) capsule 0.4 mg  0.4 mg Oral Daily Festus Aloe, MD   0.4 mg at 05/23/22 1634     Objective: Vital: Vitals:   05/23/22 0534 05/23/22 0815 05/23/22 2203 05/24/22 0509  BP: 130/87 126/90 135/90 122/73  Pulse: (!) 58 (!) 59 69 67  Resp: '20 16 18 19  '$ Temp: 98 F (36.7 C) 98 F (36.7 C) 98.7 F (37.1 C) 98.5 F (36.9 C)  TempSrc: Oral Oral Oral Oral  SpO2: 95% 95% 99% 99%  Weight:      Height:       I/Os: I/O last 3 completed shifts: In: 19008.3 [P.O.:1440; I.V.:2568.3; Other:15000] Out: 09735 [Urine:12415; Stool:1]  Physical Exam:  General: Patient is in no apparent distress Lungs: Normal respiratory effort, chest expands symmetrically. Foley: 18Fr foley draining dark pink urine, no clots  Ext: lower extremities symmetric  Lab Results: Recent Labs    05/22/22 0329 05/23/22 0313 05/24/22 0304  WBC 4.9 8.3 7.9  HGB 13.9 14.6 13.3  HCT 40.1 43.2 39.2   Recent Labs    05/22/22 0329 05/23/22 0313 05/24/22 0304  NA 137 137 138  K 3.6 3.6 3.4*  CL 104 102  102  CO2 '27 27 28  '$ GLUCOSE 110* 145* 124*  BUN '11 13 14  '$ CREATININE 1.02 1.11 0.90  CALCIUM 8.3* 8.6* 8.2*   No results for input(s): LABPT, INR in the last 72 hours. No results for input(s): LABURIN in the last 72 hours. Results for orders placed or performed during the hospital encounter of 05/18/22  Urine Culture     Status: None   Collection Time: 05/18/22  2:00 PM   Specimen: Urine, Clean Catch  Result Value Ref Range Status   Specimen Description URINE, CLEAN CATCH  Final   Special Requests NONE  Final   Culture   Final    NO GROWTH Performed at [facility], [place] [address], [place], [place] [address]    Report Status 05/19/2022 FINAL  Final    Studies/Results: DG C-Arm 1-60 Min-No Report  Result Date: 05/22/2022 Fluoroscopy was utilized by the requesting  physician.  No radiographic interpretation.   DG C-Arm 1-60 Min-No Report  Result Date: 05/22/2022 Fluoroscopy was utilized by the requesting physician.  No radiographic interpretation.    Assessment/Plan: Left ureteral calculus POD2 s/p left ureteral stent: -continue stent -tamsulosin, oxycodone, and oxybutynin for pain -discussed obtaining KUB in office to see if amenable to repeat ESWL vs ureteroscopy  Urinary retention: -continue foley and will plan for VT in office on 6/1 -pt instructed not to take oxybuytnin the morning of void trial  OK for discharged from urology   Jacalyn Lefevre, MD Urology 05/24/2022, 7:56 AM

## 2022-05-24 NOTE — Progress Notes (Signed)
Patient discharged home.  Discharge instructions explained, patient verbalizes understanding.  Patient discharged with foley (leg-bag), education provided on foley care.

## 2022-05-24 NOTE — Progress Notes (Signed)
Notified on call urology provider due to patient having pain and discomfort inside penis when trying to urinate. Irrigated patients's foley without any clots. On call urology provider reassured it was just irritation from foley. Patient had been putting out plenty of urine. Bladder scanned patient just to verify no urine in bladder, and at most it showed 9 mL in the bladder.

## 2022-05-26 ENCOUNTER — Other Ambulatory Visit: Payer: Self-pay

## 2022-05-26 ENCOUNTER — Encounter (HOSPITAL_COMMUNITY): Payer: Self-pay

## 2022-05-26 ENCOUNTER — Other Ambulatory Visit: Payer: Self-pay | Admitting: Urology

## 2022-05-26 ENCOUNTER — Emergency Department (HOSPITAL_COMMUNITY)
Admission: EM | Admit: 2022-05-26 | Discharge: 2022-05-26 | Disposition: A | Discharge status: Home / Self Care | Payer: BC Managed Care – PPO | Attending: Emergency Medicine | Admitting: Emergency Medicine

## 2022-05-26 DIAGNOSIS — R8289 Other abnormal findings on cytological and histological examination of urine: Secondary | ICD-10-CM | POA: Insufficient documentation

## 2022-05-26 DIAGNOSIS — Z79899 Other long term (current) drug therapy: Secondary | ICD-10-CM | POA: Insufficient documentation

## 2022-05-26 DIAGNOSIS — I10 Essential (primary) hypertension: Secondary | ICD-10-CM | POA: Diagnosis not present

## 2022-05-26 DIAGNOSIS — J45909 Unspecified asthma, uncomplicated: Secondary | ICD-10-CM | POA: Diagnosis not present

## 2022-05-26 DIAGNOSIS — R339 Retention of urine, unspecified: Secondary | ICD-10-CM | POA: Insufficient documentation

## 2022-05-26 LAB — URINALYSIS, ROUTINE W REFLEX MICROSCOPIC
RBC / HPF: >50 RBC/hpf — ABNORMAL HIGH (ref 0–5)
WBC, UA: >50 WBC/hpf — ABNORMAL HIGH (ref 0–5)

## 2022-05-26 NOTE — ED Provider Notes (Signed)
[facility] DEPT Provider Note   CSN: [id number] Arrival date & time: 05/26/22  1907     History {Add pertinent medical, surgical, social history, OB history to HPI:1} Chief Complaint  Patient presents with   Urinary Retention    Rickey Cruz is a 61 y.o. male.  HPI  61 year old male presents emergency department with  Home Medications Prior to Admission medications   Medication Sig Start Date End Date Taking? Authorizing Provider  albuterol (VENTOLIN HFA) 108 (90 Base) MCG/ACT inhaler INHALE 2 PUFFS INTO THE LUNGS EVERY 4 HOURS AS NEEDED FOR WHEEZING OR SHORTNESS OF BREATH(COUGHING FITS) 12/21/21   Kozlow, Donnamarie Poag, MD  amLODipine (NORVASC) 5 MG tablet TAKE 1 TABLET(5 MG) BY MOUTH DAILY 12/21/21   Horald Pollen, MD  Budeson-Glycopyrrol-Formoterol (BREZTRI AEROSPHERE) 160-9-4.8 MCG/ACT AERO Inhale two puffs with spacer twice daily to prevent cough or wheeze.  Rinse, gargle, and spit after use. Patient taking differently: Inhale 2 puffs into the lungs 2 (two) times daily as needed (cough or wheezing). Rinse, gargle, and spit after use. 04/27/22   Kozlow, Donnamarie Poag, MD  finasteride (PROSCAR) 5 MG tablet Take 1 tablet (5 mg total) by mouth daily. 05/25/22   Lorella Nimrod, MD  lisinopril (ZESTRIL) 40 MG tablet Take 1 tablet (40 mg total) by mouth daily. 07/07/21   Horald Pollen, MD  ondansetron (ZOFRAN-ODT) 4 MG disintegrating tablet '4mg'$  ODT q4 hours prn nausea/vomit Patient taking differently: Take 4 mg by mouth every 4 (four) hours as needed for nausea or vomiting. 05/18/22   Milton Ferguson, MD  oxybutynin (DITROPAN) 5 MG tablet Take 1 tablet (5 mg total) by mouth every 8 (eight) hours as needed for bladder spasms. 05/24/22   Robley Fries, MD  oxyCODONE-acetaminophen (PERCOCET/ROXICET) 5-325 MG tablet Take 1 tablet by mouth every 6 (six) hours as needed for severe pain. 05/18/22   [provider]  rosuvastatin (CRESTOR) 10 MG tablet TAKE 1  TABLET(10 MG) BY MOUTH DAILY 02/14/22   Horald Pollen, MD  tamsulosin (FLOMAX) 0.4 MG CAPS capsule Take 1 capsule (0.4 mg total) by mouth at bedtime. 05/24/22   Robley Fries, MD      Allergies    Patient has no known allergies.    Review of Systems   Review of Systems  Physical Exam Updated Vital Signs BP 127/90   Pulse 76   Temp 98.2 F (36.8 C)   Resp 18   Ht '5\' 6"'$  (1.676 m)   Wt 82.6 kg   SpO2 95%   BMI 29.38 kg/m  Physical Exam  ED Results / Procedures / Treatments   Labs (all labs ordered are listed, but only abnormal results are displayed) Labs Reviewed - No data to display  EKG None  Radiology No results found.  Procedures Procedures  {Document cardiac monitor, telemetry assessment procedure when appropriate:1}  Medications Ordered in ED Medications - No data to display  ED Course/ Medical Decision Making/ A&P                           Medical Decision Making  ***  {Document critical care time when appropriate:1} {Document review of labs and clinical decision tools ie heart score, Chads2Vasc2 etc:1}  {Document your independent review of radiology images, and any outside records:1} {Document your discussion with family members, caretakers, and with consultants:1} {Document social determinants of health affecting pt's care:1} {Document your decision making why or why not admission,  treatments were needed:1} Final Clinical Impression(s) / ED Diagnoses Final diagnoses:  None    Rx / DC Orders ED Discharge Orders     None

## 2022-05-26 NOTE — ED Triage Notes (Signed)
Pt presents to ED with c/o urinary retention since this morning. Pt states he had his foley catheter removed at the doctor's office this morning and has been unable to urinate since. Endorses left side flank discomfort.

## 2022-05-26 NOTE — Discharge Instructions (Addendum)
Please keep your appointment with urology tomorrow morning 8.  You will be discharged with Foley catheter in place.  Maintain proper Foley catheter care.  Your urine today was too bloody to determine whether or not you had a urinary tract infection.  Follow-up with urology concerning results.  I will attach information regarding Foley catheter care.  Please not hesitate to return to the emergency department for worrisome signs symptoms we discussed become apparent.

## 2022-05-28 ENCOUNTER — Encounter (HOSPITAL_COMMUNITY): Payer: Self-pay

## 2022-05-28 ENCOUNTER — Emergency Department (HOSPITAL_COMMUNITY): Payer: BC Managed Care – PPO

## 2022-05-28 ENCOUNTER — Emergency Department (HOSPITAL_COMMUNITY)
Admission: EM | Admit: 2022-05-28 | Discharge: 2022-05-28 | Disposition: A | Discharge status: Home / Self Care | Payer: BC Managed Care – PPO | Attending: Emergency Medicine | Admitting: Emergency Medicine

## 2022-05-28 DIAGNOSIS — R319 Hematuria, unspecified: Secondary | ICD-10-CM

## 2022-05-28 DIAGNOSIS — Z7951 Long term (current) use of inhaled steroids: Secondary | ICD-10-CM | POA: Diagnosis not present

## 2022-05-28 DIAGNOSIS — Z8546 Personal history of malignant neoplasm of prostate: Secondary | ICD-10-CM | POA: Diagnosis not present

## 2022-05-28 DIAGNOSIS — J45909 Unspecified asthma, uncomplicated: Secondary | ICD-10-CM | POA: Diagnosis not present

## 2022-05-28 DIAGNOSIS — I10 Essential (primary) hypertension: Secondary | ICD-10-CM | POA: Insufficient documentation

## 2022-05-28 DIAGNOSIS — K219 Gastro-esophageal reflux disease without esophagitis: Secondary | ICD-10-CM | POA: Insufficient documentation

## 2022-05-28 DIAGNOSIS — Z79899 Other long term (current) drug therapy: Secondary | ICD-10-CM | POA: Diagnosis not present

## 2022-05-28 DIAGNOSIS — N201 Calculus of ureter: Secondary | ICD-10-CM

## 2022-05-28 LAB — URINALYSIS, ROUTINE W REFLEX MICROSCOPIC

## 2022-05-28 LAB — URINALYSIS, MICROSCOPIC (REFLEX): RBC / HPF: >50 RBC/hpf (ref 0–5)

## 2022-05-28 LAB — BASIC METABOLIC PANEL
Anion gap: 9 (ref 5–15)
BUN: 12 mg/dL (ref 6–20)
CO2: 23 mmol/L (ref 22–32)
Calcium: 8.9 mg/dL (ref 8.9–10.3)
Chloride: 105 mmol/L (ref 98–111)
Creatinine, Ser: 1.03 mg/dL (ref 0.61–1.24)
GFR, Estimated: >60 mL/min (ref >60)
Glucose, Bld: 105 mg/dL — ABNORMAL HIGH (ref 70–99)
Potassium: 3.3 mmol/L — ABNORMAL LOW (ref 3.5–5.1)
Sodium: 137 mmol/L (ref 135–145)

## 2022-05-28 LAB — CBC
HCT: 39.2 % (ref 39.0–52.0)
Hemoglobin: 13.7 g/dL (ref 13.0–17.0)
MCH: 30.6 pg (ref 26.0–34.0)
MCHC: 34.9 g/dL (ref 30.0–36.0)
MCV: 87.5 fL (ref 80.0–100.0)
Platelets: 226 10*3/uL (ref 150–400)
RBC: 4.48 MIL/uL (ref 4.22–5.81)
RDW: 12 % (ref 11.5–15.5)
WBC: 6.4 10*3/uL (ref 4.0–10.5)
nRBC: 0 % (ref 0.0–0.2)

## 2022-05-28 IMAGING — CT CT RENAL STONE PROTOCOL
2 of 4 series · 14 of 46 positions shown, 16 images · non-contrast
Comparison: CT the abdomen and pelvis [DATE].

CLINICAL DATA: 60-year-old male with history of flank pain.
Suspected kidney stone. Blood in catheter.



[Series 2: axial st · axial · 0.94mm/px · z∈[+1078,+1548]mm · 11 of 108 slices shown, 13 images]
[im 7/108  soft-tissue]
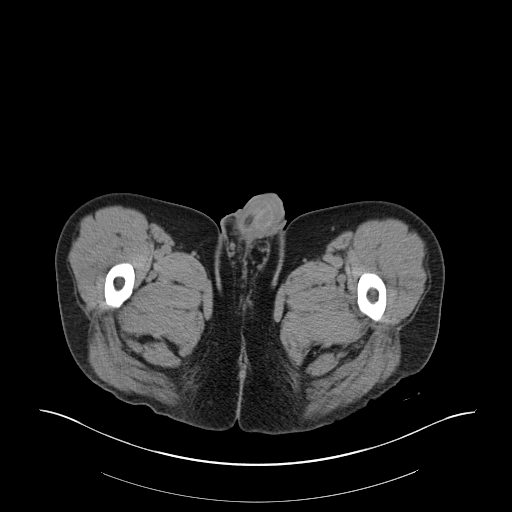
[im 7/108  bone]
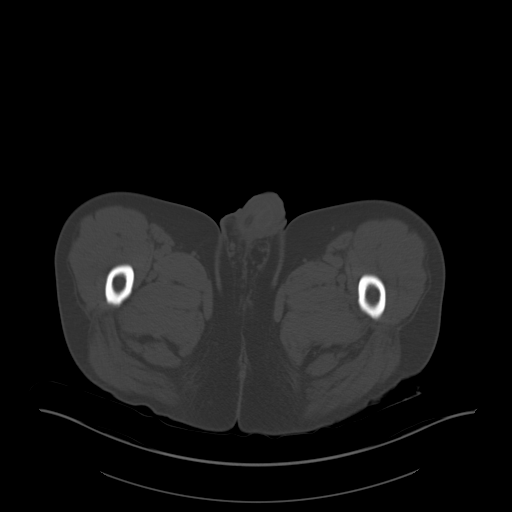
[im 21/108  soft-tissue]
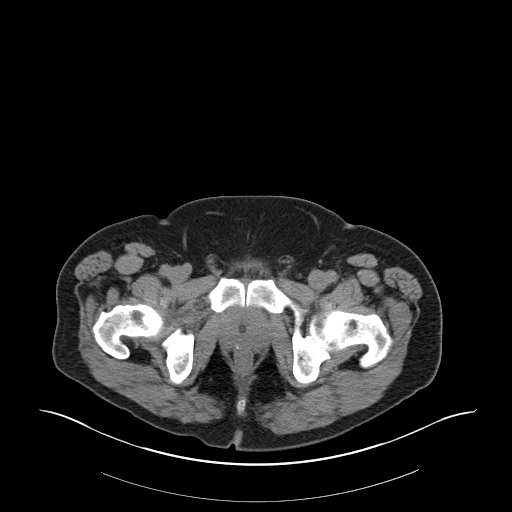
[im 27/108  soft-tissue]
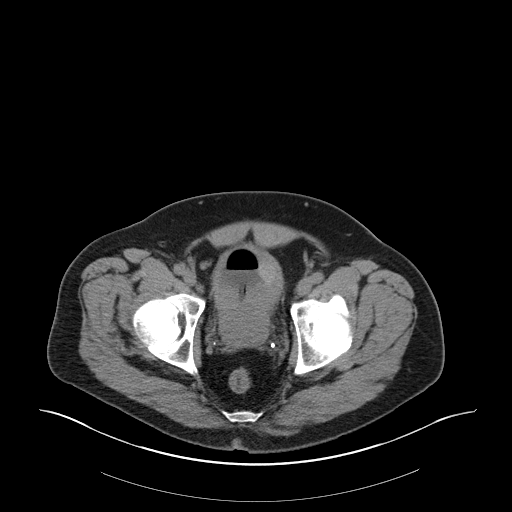
[im 34/108  soft-tissue]
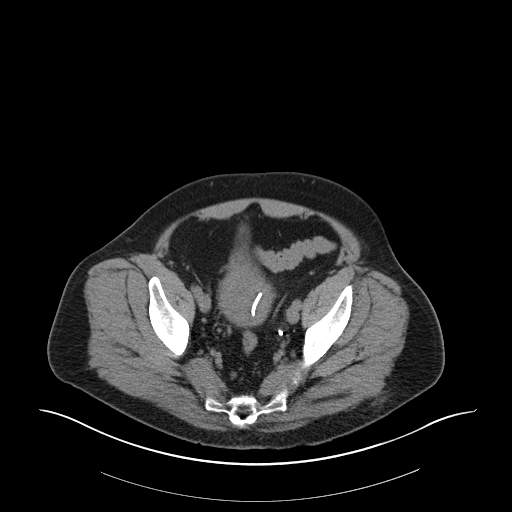
[im 47/108  soft-tissue]
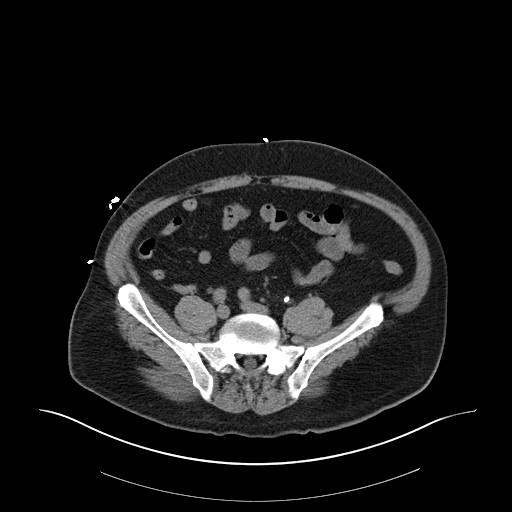
[im 54/108  soft-tissue]
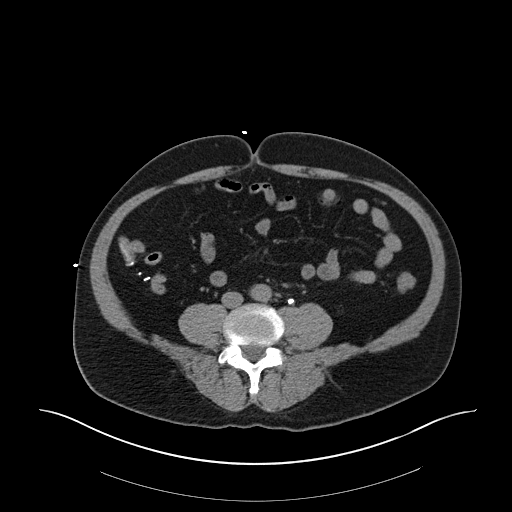
[im 61/108  soft-tissue]
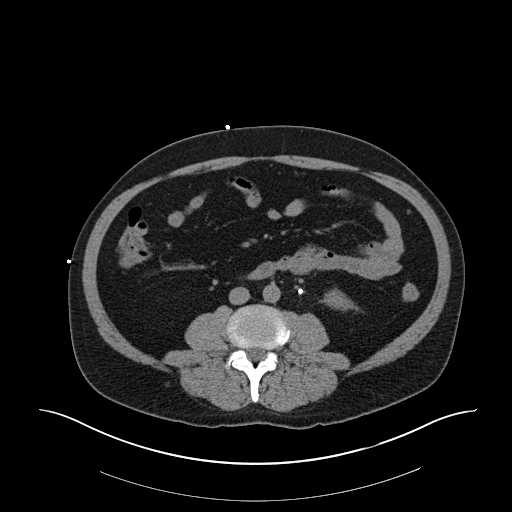
[im 74/108  soft-tissue]
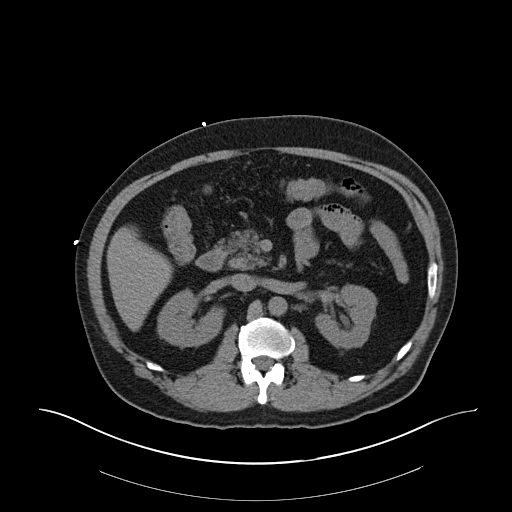
[im 81/108  soft-tissue]
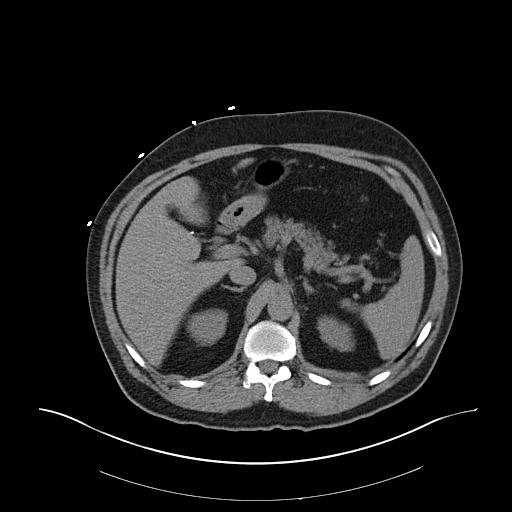
[im 81/108  bone]
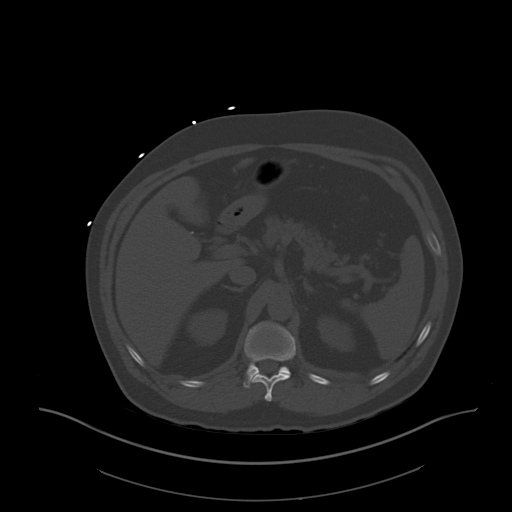
[im 87/108  soft-tissue]
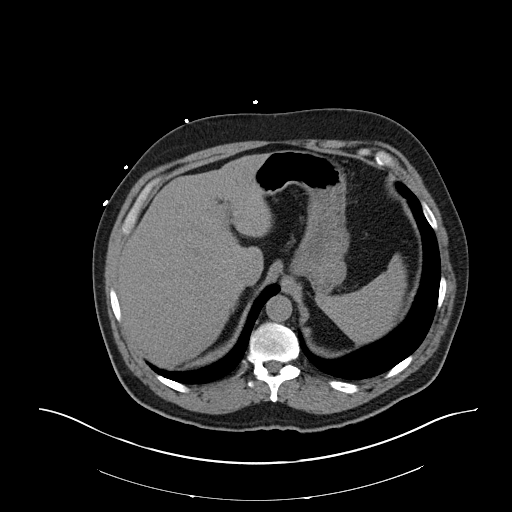
[im 101/108  soft-tissue]
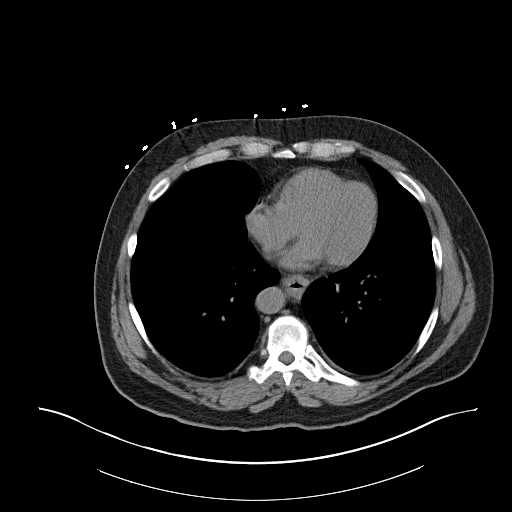

[Series 4: coronal · coronal · 0.77mm/px · 3 of 164 slices shown]
[im 55/164  soft-tissue]
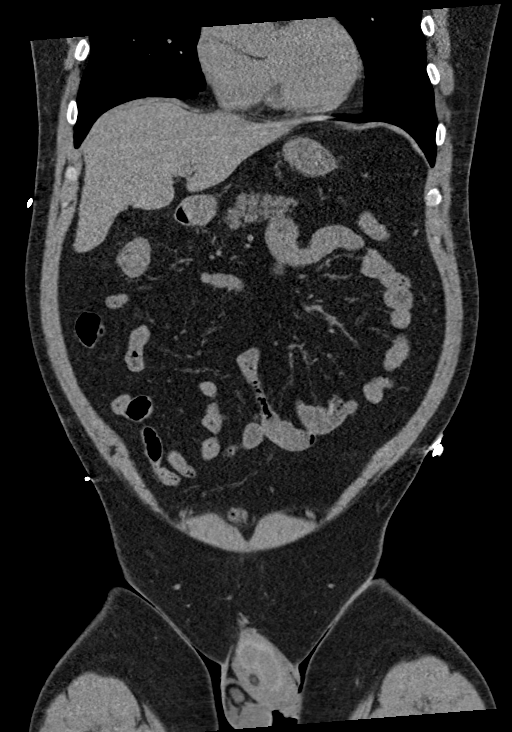
[im 73/164  soft-tissue]
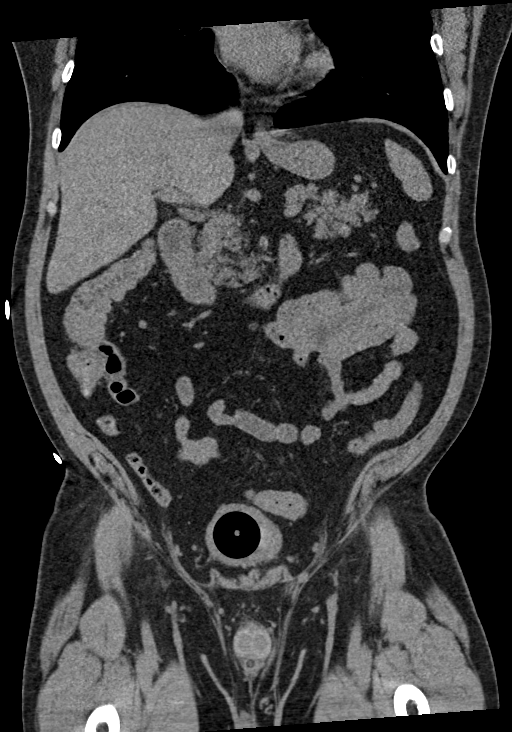
[im 91/164  soft-tissue]
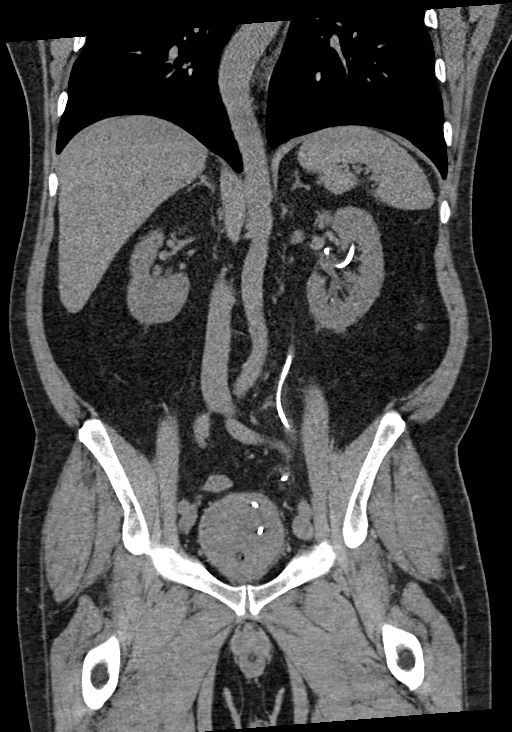

[14 of 46 positions shown; findings below may reference images not displayed]

FINDINGS: Lower chest: Atherosclerotic calcifications in the left anterior
descending, left circumflex and right coronary arteries.

Hepatobiliary: No definite suspicious cystic or solid hepatic
lesions are confidently identified on today's noncontrast CT
examination. Status post cholecystectomy.

Pancreas: No definite pancreatic mass or peripancreatic fluid
collections or inflammatory changes are noted on today's noncontrast
CT examination.

Spleen: Unremarkable.

Adrenals/Urinary Tract: There has been interval placement of a
left-sided double-J ureteral stent, proximal loop which is reformed
in the left renal pelvis and collecting system, and distal loop is
reformed in the urinary bladder. Small amount of gas in the left
renal collecting system is presumably iatrogenic. 4 mm
nonobstructive calculus in the lower pole collecting system of the
left kidney. Previously noted left ureteral stone has migrated, now
in the distal third of the left ureter (axial image 73 of series 2
demonstrates a 3 mm calculus adjacent to the indwelling stent).
Unenhanced appearance of the right kidney and bilateral adrenal
glands is otherwise normal. No hydroureteronephrosis. Some high
attenuation material is noted within the lumen of the urinary
bladder, presumably thrombus. Foley balloon catheter present within
the lumen of the urinary bladder which is otherwise decompressed
around the indwelling Foley balloon catheter.

Stomach/Bowel: Unenhanced appearance of the stomach is normal. There
is no pathologic dilatation of small bowel or colon. Status post
appendectomy.

Vascular/Lymphatic: Aortic atherosclerosis. No lymphadenopathy noted
in the abdomen or pelvis.

Reproductive: Prostate gland is severely enlarged with severe median
lobe hypertrophy. Seminal vesicles are unremarkable in appearance.

Other: No significant volume of ascites.  No pneumoperitoneum.

Musculoskeletal: There are no aggressive appearing lytic or blastic
lesions noted in the visualized portions of the skeleton.
IMPRESSION: 1. Interval placement of left-sided double-J ureteral stent.
Previously noted left ureteral stone has migrated to the distal
third of the left ureter. There is a small amount of clot in the
lumen of the urinary bladder on today's examination.
2. 4 mm nonobstructive calculus in the lower pole collecting system
of the left kidney again noted.
3. Prostatomegaly with severe median lobe hypertrophy in the
prostate gland.
4. Aortic atherosclerosis.

## 2022-05-28 MED ORDER — SODIUM CHLORIDE 0.9 % IV BOLUS
1000.0000 mL | Freq: Once | INTRAVENOUS | Status: AC
  Administered 2022-05-28: 1000 mL via INTRAVENOUS

## 2022-05-28 NOTE — ED Triage Notes (Signed)
Pt presents with c/o blood in his catheter. Pt reports that he had a catheter placed on Thursday related to a kidney stone. Pt reports that today he has noticed a significant amount of blood in the catheter and has left flank pain. Pt reports he feels like there may be a "blockage".

## 2022-05-28 NOTE — ED Provider Notes (Addendum)
[facility] DEPT Provider Note   CSN: [id number] Arrival date & time: 05/28/22  1128     History  Chief Complaint  Patient presents with   Hematuria    Rickey Cruz is a 61 y.o. male.  61 year old male with known history of ureteral calculi was recently evaluated in the emergency room Thursday presents today for evaluation of urinary retention and flank pain.  Patient following Thursday was evaluated at urologist office Friday morning and had plans to remove catheter this upcoming Monday.  He states he had planned lithotripsy for mid June.  Had recent lithotripsy 5/22.  He does have hematuria.  Denies fever, chills.  He is without nausea, or vomiting.  The history is provided by the patient. No language interpreter was used.      Home Medications Prior to Admission medications   Medication Sig Start Date End Date Taking? Authorizing Provider  albuterol (VENTOLIN HFA) 108 (90 Base) MCG/ACT inhaler INHALE 2 PUFFS INTO THE LUNGS EVERY 4 HOURS AS NEEDED FOR WHEEZING OR SHORTNESS OF BREATH(COUGHING FITS) Patient taking differently: Inhale 2 puffs into the lungs every 4 (four) hours as needed for wheezing or shortness of breath. 12/21/21   Kozlow, Donnamarie Poag, MD  amLODipine (NORVASC) 5 MG tablet TAKE 1 TABLET(5 MG) BY MOUTH DAILY Patient taking differently: Take 5 mg by mouth daily. 12/21/21   Horald Pollen, MD  Budeson-Glycopyrrol-Formoterol (BREZTRI AEROSPHERE) 160-9-4.8 MCG/ACT AERO Inhale two puffs with spacer twice daily to prevent cough or wheeze.  Rinse, gargle, and spit after use. 04/27/22   Kozlow, Donnamarie Poag, MD  finasteride (PROSCAR) 5 MG tablet Take 1 tablet (5 mg total) by mouth daily. 05/25/22   Lorella Nimrod, MD  lisinopril (ZESTRIL) 40 MG tablet Take 1 tablet (40 mg total) by mouth daily. 07/07/21   Horald Pollen, MD  ondansetron (ZOFRAN-ODT) 4 MG disintegrating tablet '4mg'$  ODT q4 hours prn nausea/vomit Patient taking differently: Take 4 mg by  mouth every 4 (four) hours as needed for nausea or vomiting. 05/18/22   Milton Ferguson, MD  oxybutynin (DITROPAN) 5 MG tablet Take 1 tablet (5 mg total) by mouth every 8 (eight) hours as needed for bladder spasms. 05/24/22   Robley Fries, MD  oxyCODONE-acetaminophen (PERCOCET/ROXICET) 5-325 MG tablet Take 1 tablet by mouth every 6 (six) hours as needed for severe pain. 05/18/22   [provider]  rosuvastatin (CRESTOR) 10 MG tablet TAKE 1 TABLET(10 MG) BY MOUTH DAILY Patient taking differently: Take 10 mg by mouth daily. 02/14/22   Horald Pollen, MD  tamsulosin (FLOMAX) 0.4 MG CAPS capsule Take 1 capsule (0.4 mg total) by mouth at bedtime. 05/24/22   Robley Fries, MD      Allergies    Patient has no known allergies.    Review of Systems   Review of Systems  Constitutional:  Negative for chills and fever.  Gastrointestinal:  Negative for abdominal pain, nausea and vomiting.  Genitourinary:  Positive for decreased urine volume, flank pain and hematuria.  All other systems reviewed and are negative.  Physical Exam Updated Vital Signs BP (!) 137/95   Pulse 65   Temp 98.4 F (36.9 C) (Oral)   Resp 18   SpO2 97%  Physical Exam Vitals and nursing note reviewed.  Constitutional:      General: He is not in acute distress.    Appearance: Normal appearance. He is not ill-appearing.  HENT:     Head: Normocephalic and atraumatic.  Nose: Nose normal.  Eyes:     General: No scleral icterus.    Extraocular Movements: Extraocular movements intact.     Conjunctiva/sclera: Conjunctivae normal.  Cardiovascular:     Rate and Rhythm: Normal rate and regular rhythm.     Pulses: Normal pulses.     Heart sounds: Normal heart sounds.  Pulmonary:     Effort: Pulmonary effort is normal. No respiratory distress.     Breath sounds: Normal breath sounds. No wheezing or rales.  Abdominal:     General: There is no distension.     Tenderness: There is abdominal tenderness. There  is left CVA tenderness and guarding. There is no right CVA tenderness.  Musculoskeletal:        General: Normal range of motion.     Cervical back: Normal range of motion.  Skin:    General: Skin is warm and dry.  Neurological:     General: No focal deficit present.     Mental Status: He is alert. Mental status is at baseline.    ED Results / Procedures / Treatments   Labs (all labs ordered are listed, but only abnormal results are displayed) Labs Reviewed  URINALYSIS, [place] PANEL  CBC    EKG None  Radiology No results found.  Procedures Procedures    Medications Ordered in ED Medications - No data to display  ED Course/ Medical Decision Making/ A&P                           Medical Decision Making Amount and/or Complexity of Data Reviewed Labs: ordered. Radiology: ordered.   Medical Decision Making / ED Course   This patient presents to the ED for concern of urinary retention, this involves an extensive number of treatment options, and is a complaint that carries with it a high risk of complications and morbidity.  The differential diagnosis includes ureterolithiasis, urinary obstruction  MDM: 61 year old male with past medical history of recurrent kidney stones with stent placements presents today for evaluation of urinary retention, hematuria, and flank pain.  Recently evaluated Thursday and followed up with urology on Friday with plans to follow-up with urology upcoming Monday to have the catheter removed.  Catheter replaced with resolution of urinary retention.  CT renal stone study shows stable findings with migration of the ureteral stone.  CBC without leukocytosis.  BMP and UA pending. BMP without renal insufficiency.  UA unable to be reported due to hematuria. Will send urine culture. Without evidence of UTI given he is without symptoms. Patient to follow up with urology.   Patient during his stay also voiced that he  has become frustrated with multiple visits to the emergency room, and urologist for same complaints.  Extensive discussion had with patient regarding his thoughts.  He denies any SI.  He states he is just frustrated and would like to speak to a counselor.  We will provide him with outpatient resources as well as information for behavioral health urgent care.  Discussed return to the emergency room for any thoughts of SI.  Patient appropriate for discharge.  He has pain medication at home.  He also has Flomax at home that he is taking.  Lab Tests: -I ordered, reviewed, and interpreted labs.   The pertinent results include:   Labs Reviewed  CBC  URINALYSIS, ROUTINE W REFLEX MICROSCOPIC  BASIC METABOLIC PANEL      EKG  EKG Interpretation  Date/Time:    Ventricular Rate:    PR Interval:    QRS Duration:   QT Interval:    QTC Calculation:   R Axis:     Text Interpretation:           Imaging Studies ordered: I ordered imaging studies including CT renal stone study I independently visualized and interpreted imaging. I agree with the radiologist interpretation   Medicines ordered and prescription drug management: Meds ordered this encounter  Medications   sodium chloride 0.9 % bolus 1,000 mL    -I have reviewed the patients home medicines and have made adjustments as needed  Reevaluation: After the interventions noted above, I reevaluated the patient and found that they have :improved  Co morbidities that complicate the patient evaluation  Past Medical History:  Diagnosis Date   Allergy    Flonase, Patanol, Zyrtec   Arthritis    psoriasis   Asthma    seasonal, with allergies   Borderline diabetes    Cluster headache    Colon polyp 10/27/2011   Depression    denies 11/11/16; denies 11/21/18 "been years"   Eczema    GERD (gastroesophageal reflux disease)    History of kidney stones    x1   Hyperlipidemia    Hypertension    Low back pain    "in kidney  area-was told that it was related to gallstones"   Prediabetes    Prostate cancer ([place])    Psoriasis    [name]   Severe sleep apnea    h/o; had procedure and weight control, does not have to wear CPAP   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 Return precautions discussed.     Final Clinical Impression(s) / ED Diagnoses Final diagnoses:  Hematuria, unspecified type  Ureterolithiasis    Rx / DC Orders ED Discharge Orders     None          Evlyn Courier, PA-C 05/28/22 1517    Milton Ferguson, MD 05/28/22 1735

## 2022-05-28 NOTE — Discharge Instructions (Addendum)
Your Foley catheter was changed out today with resolution of your urinary retention.  Your CT scan was reassuring.  Shows that your kidney stone is progressing and moving along.  No sign of a blood clot within the bladder which was likely the source of obstruction of your previous catheter.  Blood work and urine did not show any signs concerning for infection.  As per our discussion I have provided you with resources for outpatient counseling including behavioral health urgent care which is always open for you to walk-in for assistance.  Also recommend you discuss your concerns with your urologist and primary care provider.  If your symptoms gets worse return to the emergency room.  If you continue to have these thoughts and have thoughts of harming yourself please return to the emergency room.

## 2022-05-30 ENCOUNTER — Emergency Department (HOSPITAL_COMMUNITY)
Admission: EM | Admit: 2022-05-30 | Discharge: 2022-05-31 | Discharge status: Against Medical Advice | Payer: BC Managed Care – PPO | Source: Home / Self Care

## 2022-05-30 ENCOUNTER — Encounter (HOSPITAL_COMMUNITY): Payer: Self-pay | Admitting: Emergency Medicine

## 2022-05-30 ENCOUNTER — Other Ambulatory Visit: Payer: Self-pay

## 2022-05-30 ENCOUNTER — Emergency Department (HOSPITAL_COMMUNITY): Payer: BC Managed Care – PPO

## 2022-05-30 DIAGNOSIS — N200 Calculus of kidney: Secondary | ICD-10-CM | POA: Insufficient documentation

## 2022-05-30 DIAGNOSIS — Z5321 Procedure and treatment not carried out due to patient leaving prior to being seen by health care provider: Secondary | ICD-10-CM | POA: Insufficient documentation

## 2022-05-30 DIAGNOSIS — A419 Sepsis, unspecified organism: Secondary | ICD-10-CM | POA: Diagnosis not present

## 2022-05-30 DIAGNOSIS — R7309 Other abnormal glucose: Secondary | ICD-10-CM | POA: Insufficient documentation

## 2022-05-30 LAB — URINALYSIS, ROUTINE W REFLEX MICROSCOPIC
Bacteria, UA: NONE SEEN
RBC / HPF: >50 RBC/hpf — ABNORMAL HIGH (ref 0–5)
WBC, UA: >50 WBC/hpf — ABNORMAL HIGH (ref 0–5)

## 2022-05-30 LAB — CBC
HCT: 39.3 % (ref 39.0–52.0)
Hemoglobin: 13.8 g/dL (ref 13.0–17.0)
MCH: 30.6 pg (ref 26.0–34.0)
MCHC: 35.1 g/dL (ref 30.0–36.0)
MCV: 87.1 fL (ref 80.0–100.0)
Platelets: 272 10*3/uL (ref 150–400)
RBC: 4.51 MIL/uL (ref 4.22–5.81)
RDW: 12.1 % (ref 11.5–15.5)
WBC: 12.6 10*3/uL — ABNORMAL HIGH (ref 4.0–10.5)
nRBC: 0 % (ref 0.0–0.2)

## 2022-05-30 LAB — BASIC METABOLIC PANEL
Anion gap: 9 (ref 5–15)
BUN: 12 mg/dL (ref 6–20)
CO2: 20 mmol/L — ABNORMAL LOW (ref 22–32)
Calcium: 8.8 mg/dL — ABNORMAL LOW (ref 8.9–10.3)
Chloride: 105 mmol/L (ref 98–111)
Creatinine, Ser: 1.15 mg/dL (ref 0.61–1.24)
GFR, Estimated: >60 mL/min (ref >60)
Glucose, Bld: 165 mg/dL — ABNORMAL HIGH (ref 70–99)
Potassium: 3.8 mmol/L (ref 3.5–5.1)
Sodium: 134 mmol/L — ABNORMAL LOW (ref 135–145)

## 2022-05-30 IMAGING — CT CT RENAL STONE PROTOCOL
2 of 4 series · 16 of 46 positions shown, 18 images · non-contrast
Comparison: CT 2 days ago [DATE]

CLINICAL DATA: Flank pain, kidney stone suspected

since that time.
EXAM:
CT ABDOMEN AND PELVIS WITHOUT CONTRAST
TECHNIQUE: Multidetector CT imaging of the abdomen and pelvis was performed
following the standard protocol without IV contrast.
RADIATION DOSE REDUCTION: This exam was performed according to the
departmental dose-optimization program which includes automated
exposure control, adjustment of the mA and/or kV according to
patient size and/or use of iterative reconstruction technique.

[Series 2: axial st · axial · 0.95mm/px · z∈[-550,-120]mm · 13 of 98 slices shown, 15 images]
[im 6/98  soft-tissue]
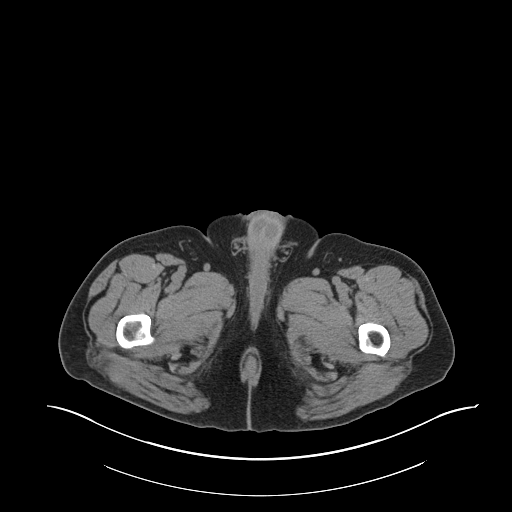
[im 6/98  bone]
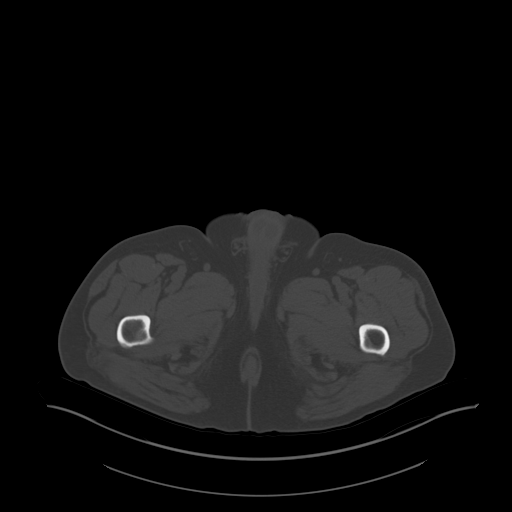
[im 12/98  soft-tissue]
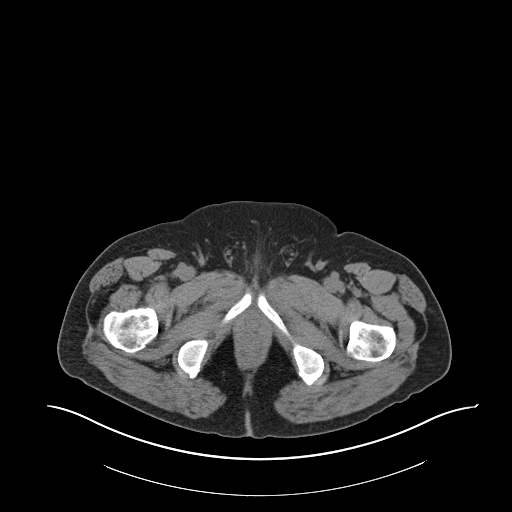
[im 23/98  soft-tissue]
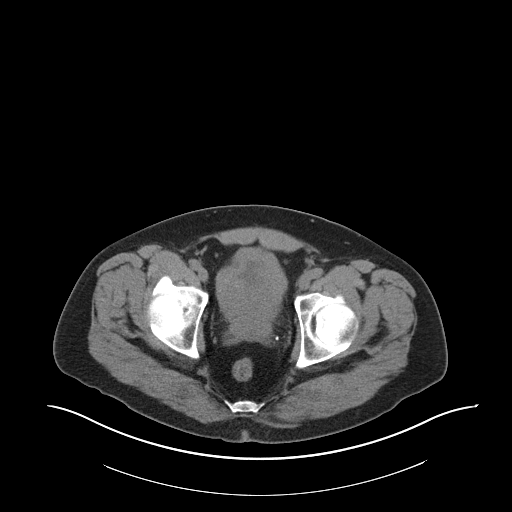
[im 29/98  soft-tissue]
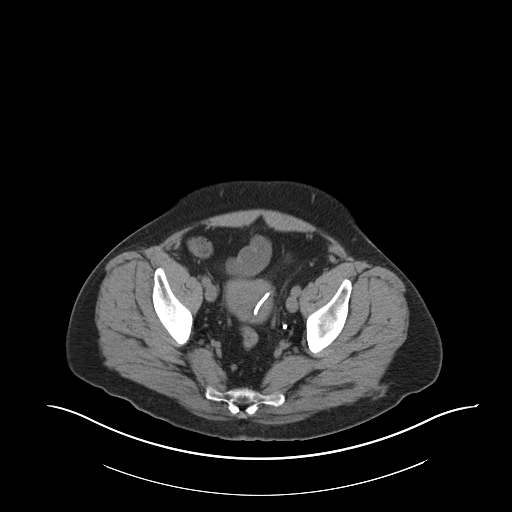
[im 35/98  soft-tissue]
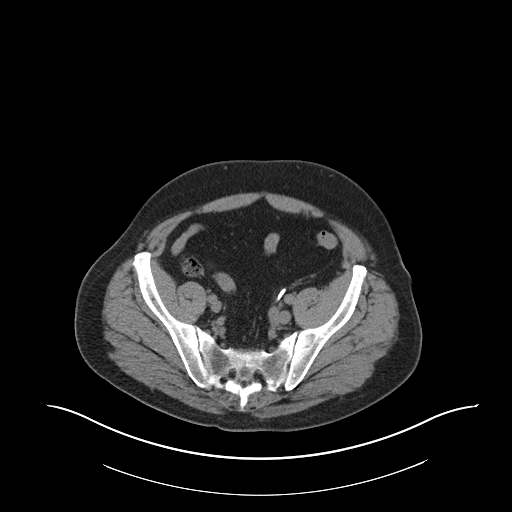
[im 40/98  soft-tissue]
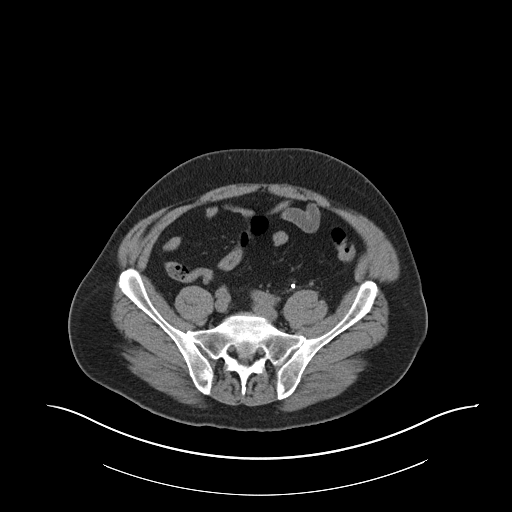
[im 52/98  soft-tissue]
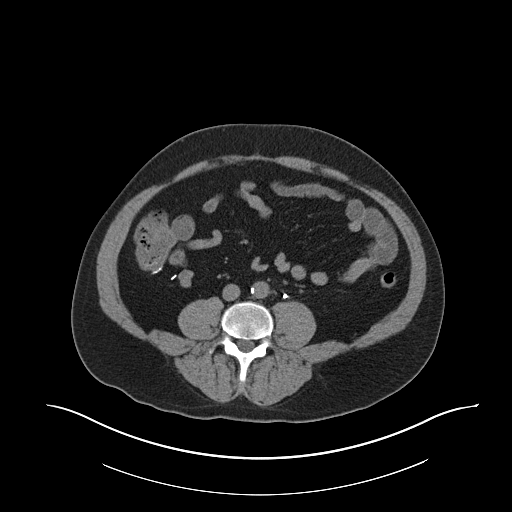
[im 58/98  soft-tissue]
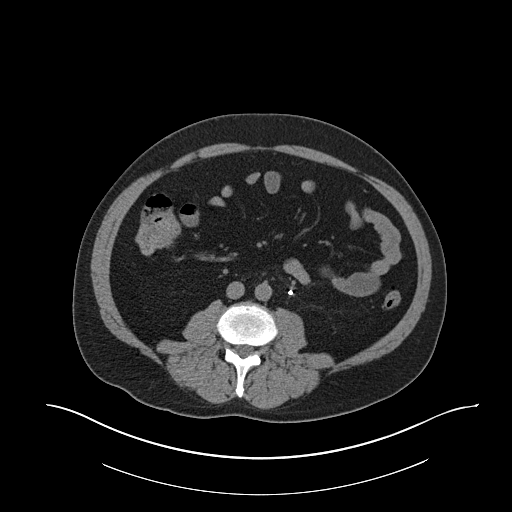
[im 63/98  soft-tissue]
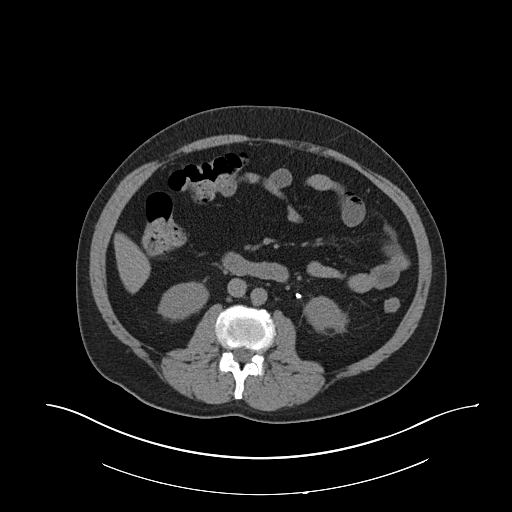
[im 63/98  bone]
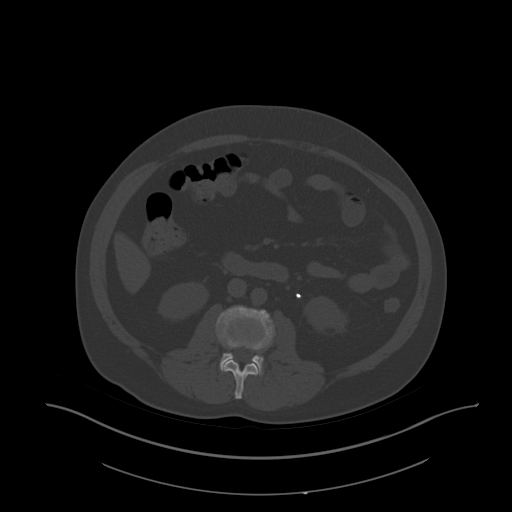
[im 69/98  soft-tissue]
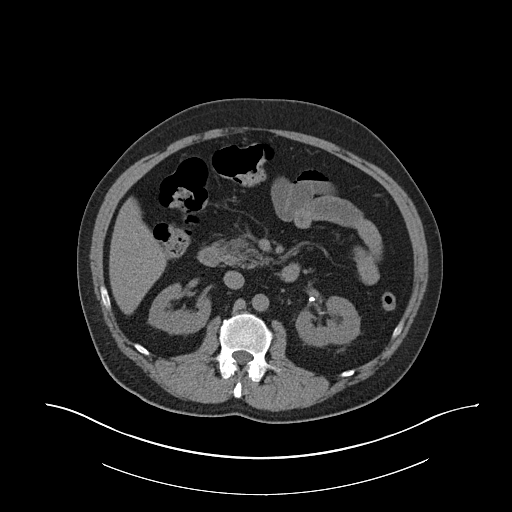
[im 75/98  soft-tissue]
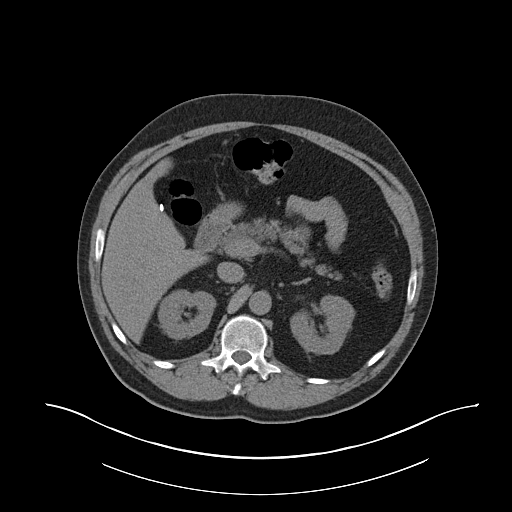
[im 86/98  soft-tissue]
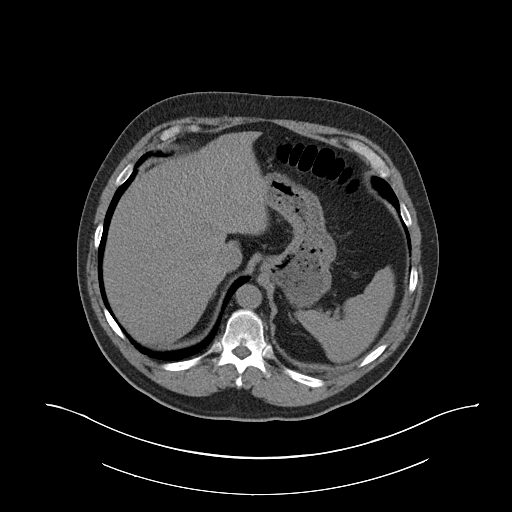
[im 92/98  soft-tissue]
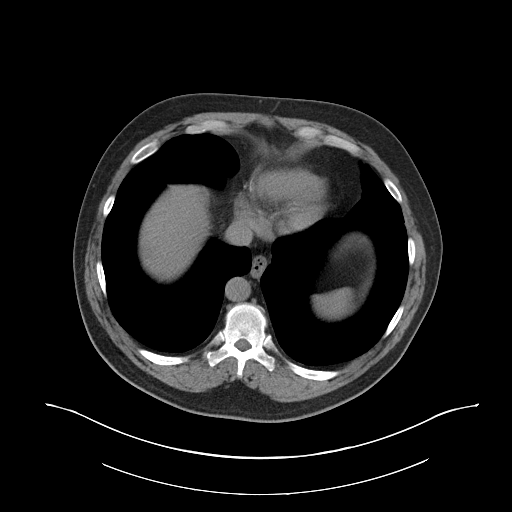

[Series 5: coronal · coronal · 0.93mm/px · 3 of 177 slices shown]
[im 59/177  soft-tissue]
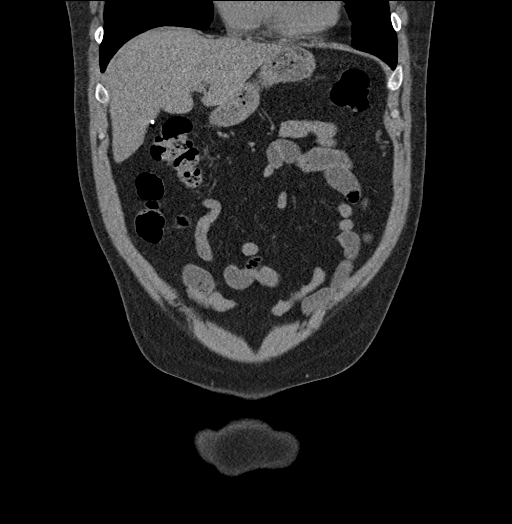
[im 79/177  soft-tissue]
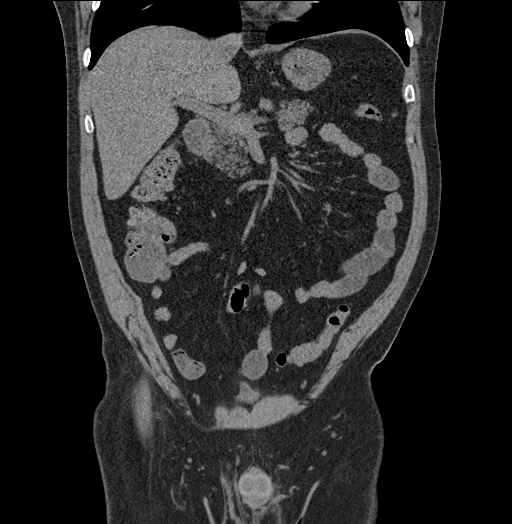
[im 98/177  soft-tissue]
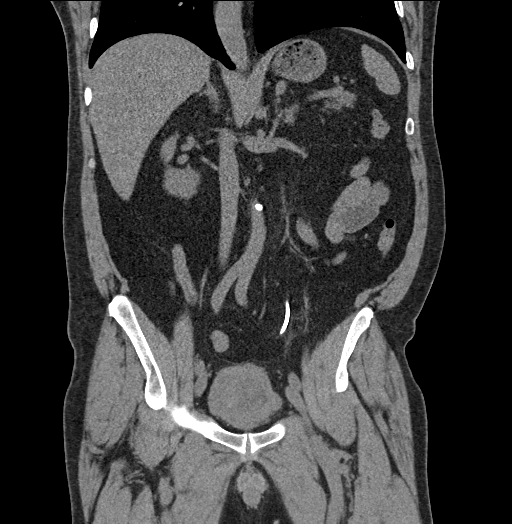

[16 of 46 positions shown; findings below may reference images not displayed]

FINDINGS: Lower chest: Clear lung bases.  No acute findings.

Hepatobiliary: Borderline hepatic steatosis. No focal liver
abnormality. Clips in the gallbladder fossa postcholecystectomy. No
biliary dilatation.

Pancreas: No ductal dilatation or inflammation.

Spleen: Normal in size without focal abnormality.

Adrenals/Urinary Tract: No adrenal nodule. Left-sided ureteral stent
remains in place. Proximal pigtail in the renal pelvis, distal
pigtail in the bladder. There is no hydronephrosis. Previous small
stone in the left distal ureter adjacent to the stent series 2,
image 67, without significant interval change. Nonobstructing stone
in the lower pole of the left kidney. Small low-density lesion in
the left upper pole is likely a cyst, but incompletely characterized
due to size and lack of the IV contrast. No right hydronephrosis,
renal calculi or perinephric edema.

Urinary bladder is essentially empty, and not well assessed. Foley
catheter has been removed.

Stomach/Bowel: Stomach is within normal limits. Appendectomy. No
evidence of bowel wall thickening, distention, or inflammatory
changes.

Vascular/Lymphatic: Mild aortic atherosclerosis. No aortic aneurysm.
No portal venous or mesenteric gas. No abdominopelvic adenopathy.

Reproductive: Markedly enlarged prostate gland causing mass effect
on the bladder base.

Other: No free air, free fluid, or intra-abdominal fluid collection.

Musculoskeletal: No free air, free fluid, or intra-abdominal fluid
collection.
IMPRESSION: 1. Left-sided ureteral stent remains in place. Previous small stone
in the left distal ureter adjacent to the stent is unchanged. No
hydronephrosis.
2. Nonobstructing stone in the lower pole of the left kidney.
3. Urinary bladder is essentially empty, the Foley catheter has been
removed.
4. Markedly enlarged prostate gland causing mass effect on the
bladder base.

Aortic Atherosclerosis ([4K]-[4K]).

## 2022-05-30 MED ORDER — KETOROLAC TROMETHAMINE 15 MG/ML IJ SOLN: 15.0000 mg | Freq: Once | INTRAMUSCULAR | Status: DC

## 2022-05-30 MED ORDER — ONDANSETRON 4 MG PO TBDP
4.0000 mg | ORAL_TABLET | Freq: Once | ORAL | Status: AC
  Administered 2022-05-30: 4 mg via ORAL
  Filled 2022-05-30: qty 1

## 2022-05-30 NOTE — ED Provider Triage Note (Signed)
Emergency Medicine Provider Triage Evaluation Note  Rickey Cruz , a 61 y.o. male  was evaluated in triage.  Pt complains of kidney stone in left flank. Patient states he was diagnosed with kidney stone 2 weeks ago but states today he has been unable to successfully urinate. Patient states urologist advised him to come here. Patient endorsing body aches and chillls but denies fevers.   Review of Systems  Positive:  Negative:   Physical Exam  There were no vitals taken for this visit. Gen:   Awake, no distress   Resp:  Normal effort  MSK:   Moves extremities without difficulty  Other:    Medical Decision Making  Medically screening exam initiated at 8:08 PM.  Appropriate orders placed.  Rickey Cruz was informed that the remainder of the evaluation will be completed by another provider, this initial triage assessment does not replace that evaluation, and the importance of remaining in the ED until their evaluation is complete.     Rickey Cecil, PA-C 05/30/22 2009

## 2022-05-30 NOTE — ED Triage Notes (Signed)
Patient presents due to urge to urinate, but is experiencing retention. He had a cathter, but it was removed this am. Since he hasn't urinated since about 1500, he came here per MD.

## 2022-05-31 NOTE — Patient Instructions (Signed)
DUE TO COVID-19 ONLY TWO VISITORS  (aged 61 and older)  ARE ALLOWED TO COME WITH YOU AND STAY IN THE WAITING ROOM ONLY DURING PRE OP AND PROCEDURE.   **NO VISITORS ARE ALLOWED IN THE SHORT STAY AREA OR RECOVERY ROOM!!**  IF YOU WILL BE ADMITTED INTO THE HOSPITAL YOU ARE ALLOWED ONLY FOUR SUPPORT PEOPLE DURING VISITATION HOURS ONLY (7 AM -8PM)   The support person(s) must pass our screening, gel in and out, and wear a mask at all times, including in the patient's room. Patients must also wear a mask when staff or their support person are in the room. Visitors GUEST BADGE MUST BE WORN VISIBLY  One adult visitor may remain with you overnight and MUST be in the room by 8 P.M.     Your procedure is scheduled on:    Report to [facility] Main Entrance    Report to admitting at AM   Call this number if you have problems the morning of surgery [phone]   Do not eat food :After Midnight.   After Midnight you may have the following liquids until ______ AM/ PM DAY OF SURGERY  Water Black Coffee (sugar ok, NO MILK/CREAM OR CREAMERS)  Tea (sugar ok, NO MILK/CREAM OR CREAMERS) regular and decaf                             Plain Jell-O (NO RED)                                           Fruit ices (not with fruit pulp, NO RED)                                     Popsicles (NO RED)                                                                  Juice: apple, WHITE grape, WHITE cranberry Sports drinks like Gatorade (NO RED) Clear broth(vegetable,chicken,beef)               Drink 2 Ensure/G2 drinks AT 10:00 PM the night before surgery.        The day of surgery:  Drink ONE (1) Pre-Surgery Clear Ensure or G2 at AM the morning of surgery. Drink in one sitting. Do not sip.  This drink was given to you during your hospital  pre-op appointment visit. Nothing else to drink after completing the  Pre-Surgery Clear Ensure or G2.          If you have questions, please contact your  surgeon's office.   FOLLOW BOWEL PREP AND ANY ADDITIONAL PRE OP INSTRUCTIONS YOU RECEIVED FROM YOUR SURGEON'S OFFICE!!!     Oral Hygiene is also important to reduce your risk of infection.                                    Remember - BRUSH YOUR TEETH THE MORNING OF SURGERY  WITH YOUR REGULAR TOOTHPASTE   Do NOT smoke after Midnight   Take these medicines the morning of surgery with A SIP OF WATER:   How to Manage Your Diabetes Before and After Surgery  Why is it important to control my blood sugar before and after surgery? Improving blood sugar levels before and after surgery helps healing and can limit problems. A way of improving blood sugar control is eating a healthy diet by:  Eating less sugar and carbohydrates  Increasing activity/exercise  Talking with your doctor about reaching your blood sugar goals High blood sugars (greater than 180 mg/dL) can raise your risk of infections and slow your recovery, so you will need to focus on controlling your diabetes during the weeks before surgery. Make sure that the doctor who takes care of your diabetes knows about your planned surgery including the date and location.  How do I manage my blood sugar before surgery? Check your blood sugar at least 4 times a day, starting 2 days before surgery, to make sure that the level is not too high or low. Check your blood sugar the morning of your surgery when you wake up and every 2 hours until you get to the Short Stay unit. If your blood sugar is less than 70 mg/dL, you will need to treat for low blood sugar: Do not take insulin. Treat a low blood sugar (less than 70 mg/dL) with  cup of clear juice (cranberry or apple), 4 glucose tablets, OR glucose gel. Recheck blood sugar in 15 minutes after treatment (to make sure it is greater than 70 mg/dL). If your blood sugar is not greater than 70 mg/dL on recheck, call [phone] for further instructions. Report your blood sugar to the short stay  nurse when you get to Short Stay.  If you are admitted to the hospital after surgery: Your blood sugar will be checked by the staff and you will probably be given insulin after surgery (instead of oral diabetes medicines) to make sure you have good blood sugar levels. The goal for blood sugar control after surgery is 80-180 mg/dL.   WHAT DO I DO ABOUT MY DIABETES MEDICATION?  Do not take oral diabetes medicines (pills) the morning of surgery.  THE NIGHT BEFORE SURGERY, take     units of       insulin.       THE MORNING OF SURGERY, take   units of         insulin.  The day of surgery, do not take other diabetes injectables, including Byetta (exenatide), Bydureon (exenatide ER), Victoza (liraglutide), or Trulicity (dulaglutide).  If your CBG is greater than 220 mg/dL, you may take  of your sliding scale  (correction) dose of insulin.    For patients with insulin pumps: Contact your diabetes doctor for specific instructions before surgery. Decrease basal rates by 20% at midnight the night before your surgery. Note that if your surgery is planned to be longer than 2 hours, your insulin pump will be removed and intravenous (IV) insulin will be started and managed by the nurses and the anesthesiologist. You will be able to restart your insulin pump once you are awake and able to manage it.  Make sure to bring insulin pump supplies to the hospital with you in case the  site needs to be changed.  Patient Signature:  Date:   Nurse Signature:  Date:   Reviewed and Endorsed by [facility] Patient Education Committee, August 2015   DO  NOT TAKE ANY ORAL DIABETIC MEDICATIONS DAY OF YOUR SURGERY  Bring CPAP mask and tubing day of surgery.                              You may not have any metal on your body including jewelry, and body piercing             Do not wear lotions, powders, cologne, or deodorant              Men may shave face and neck.   Do not bring valuables to the  hospital. [place].   Contacts, dentures or bridgework may not be worn into surgery.  DO NOT [place]. PHARMACY WILL DISPENSE MEDICATIONS LISTED ON YOUR MEDICATION LIST TO YOU DURING YOUR ADMISSION [place]!    Patients discharged on the day of surgery will not be allowed to drive home.  Someone NEEDS to stay with you for the first 24 hours after anesthesia.   Special Instructions: Bring a copy of your healthcare power of attorney and living will documents         the day of surgery if you haven't scanned them before.              Please read over the following fact sheets you were given: IF YOU HAVE QUESTIONS ABOUT YOUR PRE-OP INSTRUCTIONS PLEASE CALL [place] - Preparing for Surgery Before surgery, you can play an important role.  Because skin is not sterile, your skin needs to be as free of germs as possible.  You can reduce the number of germs on your skin by washing with CHG (chlorahexidine gluconate) soap before surgery.  CHG is an antiseptic cleaner which kills germs and bonds with the skin to continue killing germs even after washing. Please DO NOT use if you have an allergy to CHG or antibacterial soaps.  If your skin becomes reddened/irritated stop using the CHG and inform your nurse when you arrive at Short Stay. Do not shave (including legs and underarms) for at least 48 hours prior to the first CHG shower.  You may shave your face/neck.  Please follow these instructions carefully:  1.  Shower with CHG Soap the night before surgery and the  morning of surgery.  2.  If you choose to wash your hair, wash your hair first as usual with your normal  shampoo.  3.  After you shampoo, rinse your hair and body thoroughly to remove the shampoo.                             4.  Use CHG as you would any other liquid soap.  You can apply chg directly to the skin and wash.   Gently with a scrungie or clean washcloth.  5.  Apply the CHG Soap to your body ONLY FROM THE NECK DOWN.   Do   not use on face/ open                           Wound or open sores. Avoid contact with eyes, ears mouth and   genitals (private parts).  Wash face,  Genitals (private parts) with your normal soap.             6.  Wash thoroughly, paying special attention to the area where your    surgery  will be performed.  7.  Thoroughly rinse your body with warm water from the neck down.  8.  DO NOT shower/wash with your normal soap after using and rinsing off the CHG Soap.                9.  Pat yourself dry with a clean towel.            10.  Wear clean pajamas.            11.  Place clean sheets on your bed the night of your first shower and do not  sleep with pets. Day of Surgery : Do not apply any lotions/deodorants the morning of surgery.  Please wear clean clothes to the hospital/surgery center.  FAILURE TO FOLLOW THESE INSTRUCTIONS MAY RESULT IN THE CANCELLATION OF YOUR SURGERY  PATIENT SIGNATURE_________________________________  NURSE SIGNATURE__________________________________  ________________________________________________________________________ [facility] - Preparing for Surgery Before surgery, you can play an important role.  Because skin is not sterile, your skin needs to be as free of germs as possible.  You can reduce the number of germs on your skin by washing with CHG (chlorahexidine gluconate) soap before surgery.  CHG is an antiseptic cleaner which kills germs and bonds with the skin to continue killing germs even after washing. Please DO NOT use if you have an allergy to CHG or antibacterial soaps.  If your skin becomes reddened/irritated stop using the CHG and inform your nurse when you arrive at Short Stay. Do not shave (including legs and underarms) for at least 48 hours prior to the first CHG shower.  You may shave your face/neck.  Please follow  these instructions carefully:  1.  Shower with CHG Soap the night before surgery and the  morning of surgery.  2.  If you choose to wash your hair, wash your hair first as usual with your normal  shampoo.  3.  After you shampoo, rinse your hair and body thoroughly to remove the shampoo.                             4.  Use CHG as you would any other liquid soap.  You can apply chg directly to the skin and wash.  Gently with a scrungie or clean washcloth.  5.  Apply the CHG Soap to your body ONLY FROM THE NECK DOWN.   Do   not use on face/ open                           Wound or open sores. Avoid contact with eyes, ears mouth and   genitals (private parts).                       Wash face,  Genitals (private parts) with your normal soap.             6.  Wash thoroughly, paying special attention to the area where your    surgery  will be performed.  7.  Thoroughly rinse your body with warm water from the neck down.  8.  DO NOT shower/wash with your normal soap after using and rinsing off the  CHG Soap.                9.  Pat yourself dry with a clean towel.            10.  Wear clean pajamas.            11.  Place clean sheets on your bed the night of your first shower and do not  sleep with pets. Day of Surgery : Do not apply any lotions/deodorants the morning of surgery.  Please wear clean clothes to the hospital/surgery center.  FAILURE TO FOLLOW THESE INSTRUCTIONS MAY RESULT IN THE CANCELLATION OF YOUR SURGERY  PATIENT SIGNATURE_________________________________  NURSE SIGNATURE__________________________________  ________________________________________________________________________

## 2022-05-31 NOTE — ED Notes (Signed)
Pt told registration staff that he was leaving.

## 2022-05-31 NOTE — Progress Notes (Signed)
COVID Vaccine Completed: yes x 2  Date of COVID positive in last 90 days:  PCP - Horald Pollen, MD Cardiologist -   Chest x-ray - 04/28/22 Epic EKG - 05/22/22 Epic Stress Test -  ECHO -  Cardiac Cath -  Pacemaker/ICD device last checked: Spinal Cord Stimulator:  Bowel Prep -   Sleep Study -  CPAP -   Fasting Blood Sugar -  Checks Blood Sugar _____ times a day  Blood Thinner Instructions: Aspirin Instructions: Last Dose:  Activity level:  Can go up a flight of stairs and perform activities of daily living without stopping and without symptoms of chest pain or shortness of breath.  Able to exercise without symptoms  Unable to go up a flight of stairs without symptoms of     Anesthesia review:   Patient denies shortness of breath, fever, cough and chest pain at PAT appointment  Patient verbalized understanding of instructions that were given to them at the PAT appointment. Patient was also instructed that they will need to review over the PAT instructions again at home before surgery.

## 2022-06-01 ENCOUNTER — Encounter (HOSPITAL_COMMUNITY): Payer: Self-pay

## 2022-06-01 ENCOUNTER — Inpatient Hospital Stay (HOSPITAL_COMMUNITY): Payer: BC Managed Care – PPO

## 2022-06-01 ENCOUNTER — Inpatient Hospital Stay (HOSPITAL_COMMUNITY)
Admission: EM | Admit: 2022-06-01 | Discharge: 2022-06-06 | DRG: 872 | Disposition: A | Discharge status: Home / Self Care | Payer: BC Managed Care – PPO | Attending: Family Medicine | Admitting: Family Medicine

## 2022-06-01 ENCOUNTER — Inpatient Hospital Stay (HOSPITAL_COMMUNITY)
Admission: RE | Admit: 2022-06-01 | Discharge: 2022-06-01 | Disposition: A | Discharge status: Home / Self Care | Payer: BC Managed Care – PPO | Source: Ambulatory Visit

## 2022-06-01 ENCOUNTER — Other Ambulatory Visit: Payer: Self-pay

## 2022-06-01 VITALS — BP 85/65 | HR 101 | Resp 18 | Ht 66.0 in

## 2022-06-01 DIAGNOSIS — E119 Type 2 diabetes mellitus without complications: Secondary | ICD-10-CM | POA: Diagnosis present

## 2022-06-01 DIAGNOSIS — Z79899 Other long term (current) drug therapy: Secondary | ICD-10-CM

## 2022-06-01 DIAGNOSIS — A419 Sepsis, unspecified organism: Secondary | ICD-10-CM | POA: Diagnosis not present

## 2022-06-01 DIAGNOSIS — Z823 Family history of stroke: Secondary | ICD-10-CM | POA: Diagnosis not present

## 2022-06-01 DIAGNOSIS — E785 Hyperlipidemia, unspecified: Secondary | ICD-10-CM | POA: Diagnosis present

## 2022-06-01 DIAGNOSIS — K5903 Drug induced constipation: Secondary | ICD-10-CM

## 2022-06-01 DIAGNOSIS — N400 Enlarged prostate without lower urinary tract symptoms: Secondary | ICD-10-CM | POA: Diagnosis present

## 2022-06-01 DIAGNOSIS — N390 Urinary tract infection, site not specified: Secondary | ICD-10-CM | POA: Diagnosis not present

## 2022-06-01 DIAGNOSIS — N179 Acute kidney failure, unspecified: Secondary | ICD-10-CM | POA: Diagnosis present

## 2022-06-01 DIAGNOSIS — Z825 Family history of asthma and other chronic lower respiratory diseases: Secondary | ICD-10-CM | POA: Diagnosis not present

## 2022-06-01 DIAGNOSIS — E876 Hypokalemia: Secondary | ICD-10-CM | POA: Diagnosis present

## 2022-06-01 DIAGNOSIS — R6520 Severe sepsis without septic shock: Secondary | ICD-10-CM | POA: Diagnosis present

## 2022-06-01 DIAGNOSIS — E1169 Type 2 diabetes mellitus with other specified complication: Secondary | ICD-10-CM

## 2022-06-01 DIAGNOSIS — K760 Fatty (change of) liver, not elsewhere classified: Secondary | ICD-10-CM | POA: Diagnosis present

## 2022-06-01 DIAGNOSIS — R319 Hematuria, unspecified: Secondary | ICD-10-CM | POA: Diagnosis not present

## 2022-06-01 DIAGNOSIS — Z87442 Personal history of urinary calculi: Secondary | ICD-10-CM | POA: Diagnosis not present

## 2022-06-01 DIAGNOSIS — K56609 Unspecified intestinal obstruction, unspecified as to partial versus complete obstruction: Secondary | ICD-10-CM | POA: Diagnosis present

## 2022-06-01 DIAGNOSIS — I10 Essential (primary) hypertension: Secondary | ICD-10-CM | POA: Diagnosis not present

## 2022-06-01 DIAGNOSIS — E1122 Type 2 diabetes mellitus with diabetic chronic kidney disease: Secondary | ICD-10-CM | POA: Diagnosis not present

## 2022-06-01 DIAGNOSIS — E7800 Pure hypercholesterolemia, unspecified: Secondary | ICD-10-CM

## 2022-06-01 DIAGNOSIS — B952 Enterococcus as the cause of diseases classified elsewhere: Secondary | ICD-10-CM | POA: Diagnosis present

## 2022-06-01 DIAGNOSIS — N182 Chronic kidney disease, stage 2 (mild): Secondary | ICD-10-CM | POA: Diagnosis not present

## 2022-06-01 DIAGNOSIS — N136 Pyonephrosis: Secondary | ICD-10-CM | POA: Diagnosis present

## 2022-06-01 DIAGNOSIS — M25571 Pain in right ankle and joints of right foot: Secondary | ICD-10-CM | POA: Diagnosis present

## 2022-06-01 DIAGNOSIS — R7989 Other specified abnormal findings of blood chemistry: Secondary | ICD-10-CM | POA: Diagnosis present

## 2022-06-01 DIAGNOSIS — E1159 Type 2 diabetes mellitus with other circulatory complications: Secondary | ICD-10-CM | POA: Diagnosis present

## 2022-06-01 DIAGNOSIS — R7303 Prediabetes: Secondary | ICD-10-CM

## 2022-06-01 DIAGNOSIS — K5900 Constipation, unspecified: Secondary | ICD-10-CM | POA: Diagnosis present

## 2022-06-01 LAB — URINALYSIS, ROUTINE W REFLEX MICROSCOPIC
Bilirubin Urine: NEGATIVE
Glucose, UA: NEGATIVE mg/dL
Ketones, ur: NEGATIVE mg/dL
Nitrite: NEGATIVE
Protein, ur: 100 mg/dL — AB
RBC / HPF: >50 RBC/hpf — ABNORMAL HIGH (ref 0–5)
Specific Gravity, Urine: 1.018 (ref 1.005–1.030)
WBC, UA: >50 WBC/hpf — ABNORMAL HIGH (ref 0–5)
pH: 5 (ref 5.0–8.0)

## 2022-06-01 LAB — LACTIC ACID, PLASMA
Lactic Acid, Venous: 3 mmol/L (ref 0.5–1.9)
Lactic Acid, Venous: 1 mmol/L (ref 0.5–1.9)
Lactic Acid, Venous: 1 mmol/L (ref 0.5–1.9)

## 2022-06-01 LAB — HEPATIC FUNCTION PANEL
ALT: 54 U/L — ABNORMAL HIGH (ref 0–44)
AST: 53 U/L — ABNORMAL HIGH (ref 15–41)
Albumin: 3.6 g/dL (ref 3.5–5.0)
Alkaline Phosphatase: 61 U/L (ref 38–126)
Bilirubin, Direct: 0.1 mg/dL (ref 0.0–0.2)
Indirect Bilirubin: 0.7 mg/dL (ref 0.3–0.9)
Total Bilirubin: 0.8 mg/dL (ref 0.3–1.2)
Total Protein: 7.3 g/dL (ref 6.5–8.1)

## 2022-06-01 LAB — CBC
HCT: 38.7 % — ABNORMAL LOW (ref 39.0–52.0)
Hemoglobin: 13.3 g/dL (ref 13.0–17.0)
MCH: 30.8 pg (ref 26.0–34.0)
MCHC: 34.4 g/dL (ref 30.0–36.0)
MCV: 89.6 fL (ref 80.0–100.0)
Platelets: 264 10*3/uL (ref 150–400)
RBC: 4.32 MIL/uL (ref 4.22–5.81)
RDW: 12.2 % (ref 11.5–15.5)
WBC: 15 10*3/uL — ABNORMAL HIGH (ref 4.0–10.5)
nRBC: 0 % (ref 0.0–0.2)

## 2022-06-01 LAB — BASIC METABOLIC PANEL
Anion gap: 13 (ref 5–15)
BUN: 26 mg/dL — ABNORMAL HIGH (ref 6–20)
CO2: 23 mmol/L (ref 22–32)
Calcium: 8.7 mg/dL — ABNORMAL LOW (ref 8.9–10.3)
Chloride: 97 mmol/L — ABNORMAL LOW (ref 98–111)
Creatinine, Ser: 1.84 mg/dL — ABNORMAL HIGH (ref 0.61–1.24)
GFR, Estimated: 41 mL/min — ABNORMAL LOW (ref >60)
Glucose, Bld: 108 mg/dL — ABNORMAL HIGH (ref 70–99)
Potassium: 3.2 mmol/L — ABNORMAL LOW (ref 3.5–5.1)
Sodium: 133 mmol/L — ABNORMAL LOW (ref 135–145)

## 2022-06-01 LAB — SODIUM, URINE, RANDOM: Sodium, Ur: 35 mmol/L

## 2022-06-01 LAB — CBG MONITORING, ED
Glucose-Capillary: 116 mg/dL — ABNORMAL HIGH (ref 70–99)
Glucose-Capillary: 135 mg/dL — ABNORMAL HIGH (ref 70–99)

## 2022-06-01 IMAGING — US US EXTREM LOW*R* LIMITED
1 series · 11 of 11 positions shown · non-contrast
Comparison: None Available.

CLINICAL DATA: Painful right ankle nodule.

EXAM:
ULTRASOUND RIGHT LOWER EXTREMITY LIMITED
TECHNIQUE: Ultrasound examination of the lower extremity soft tissues was
performed in the area of clinical concern.

[Series 1: us extrem low bilat ltd mc & wl · 11 acquisitions, 11 frames shown]
[im 1/11]
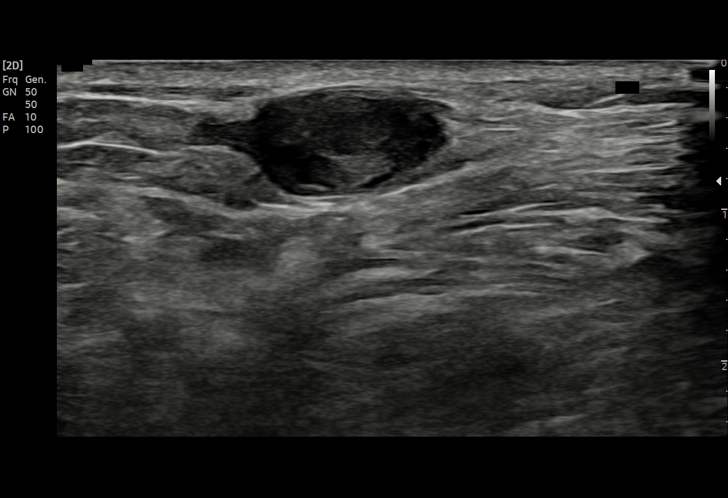
[im 2/11]
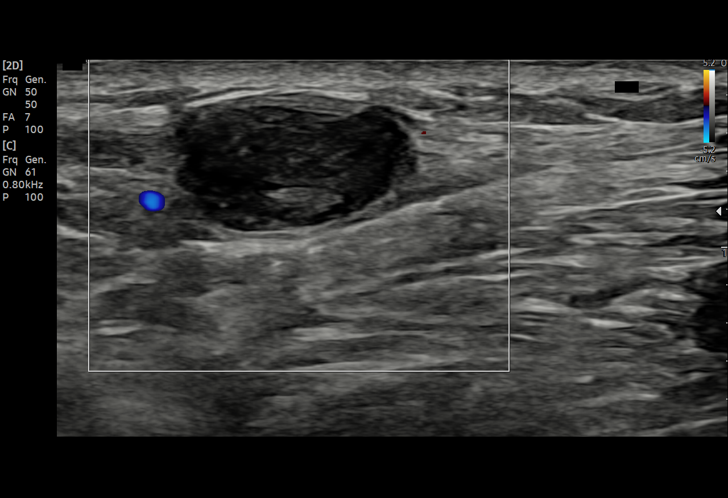
[im 3/11]
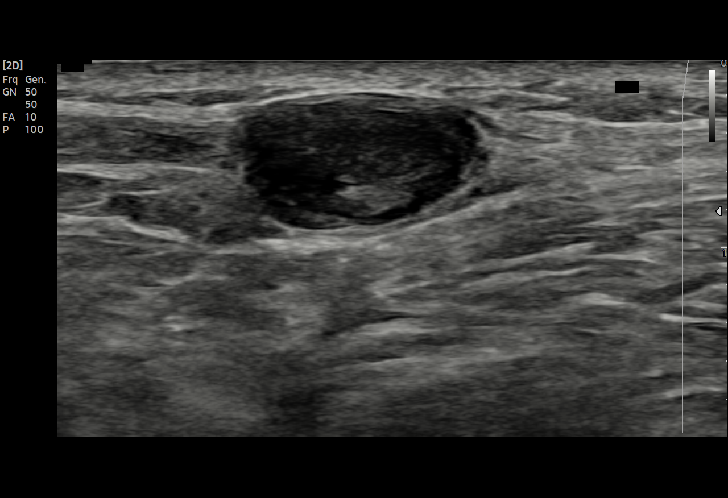
[im 4/11]
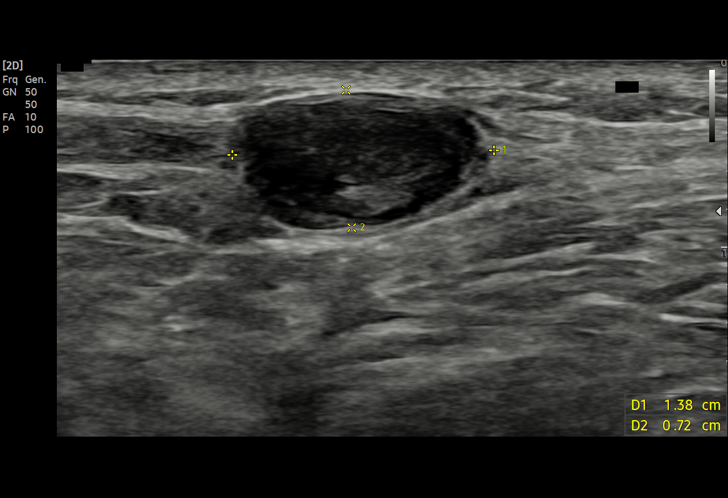
[im 5/11]
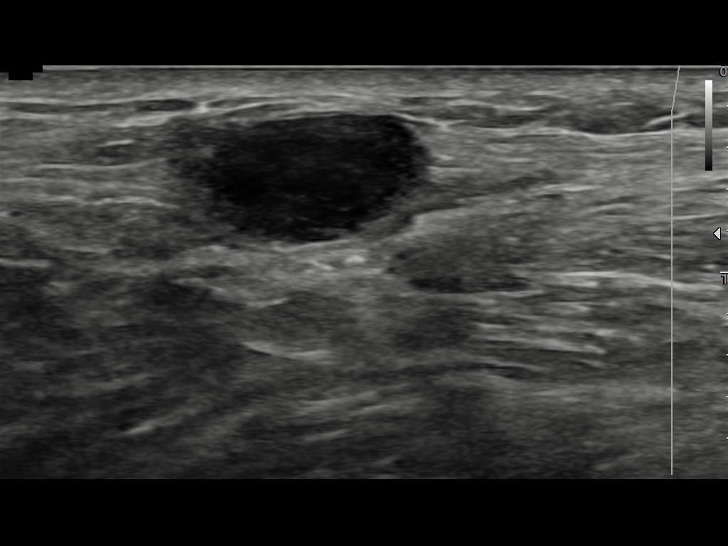
[im 6/11]
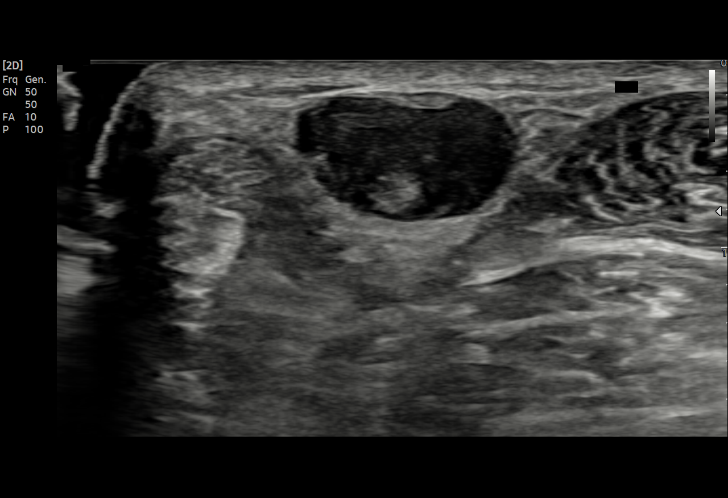
[im 7/11]
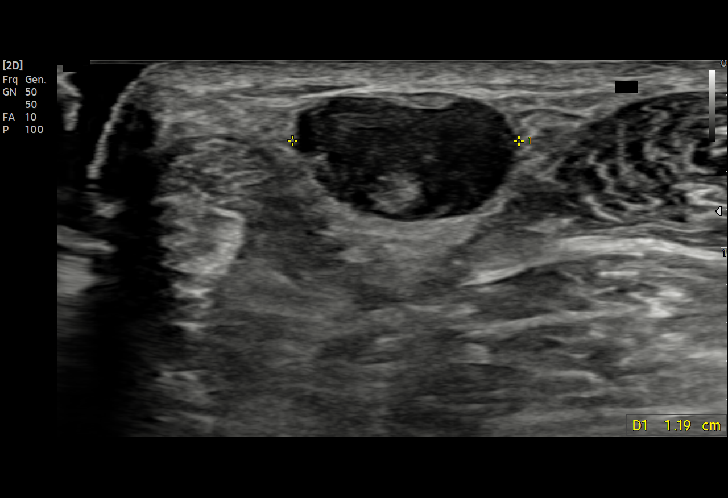
[im 8/11]
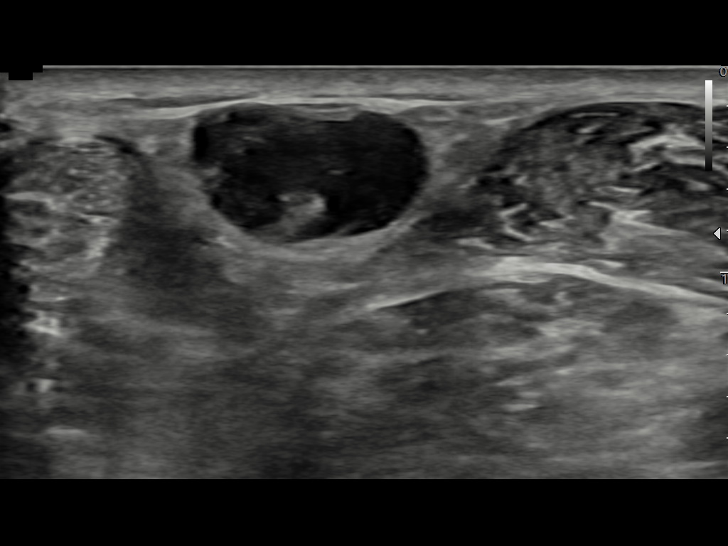
[im 9/11]
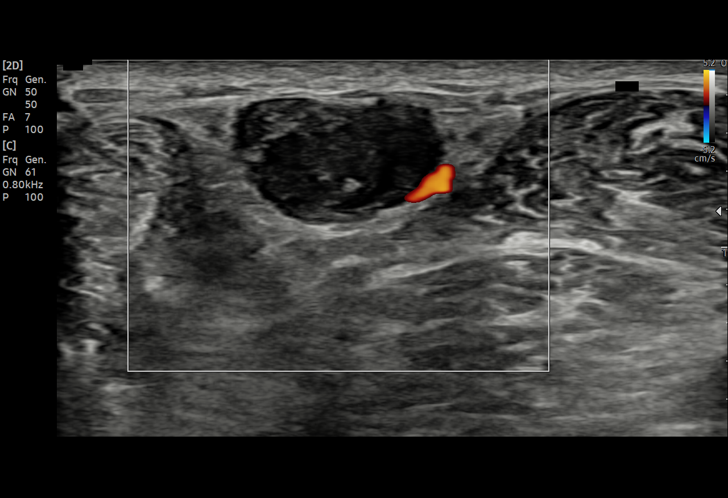
[im 10/11]
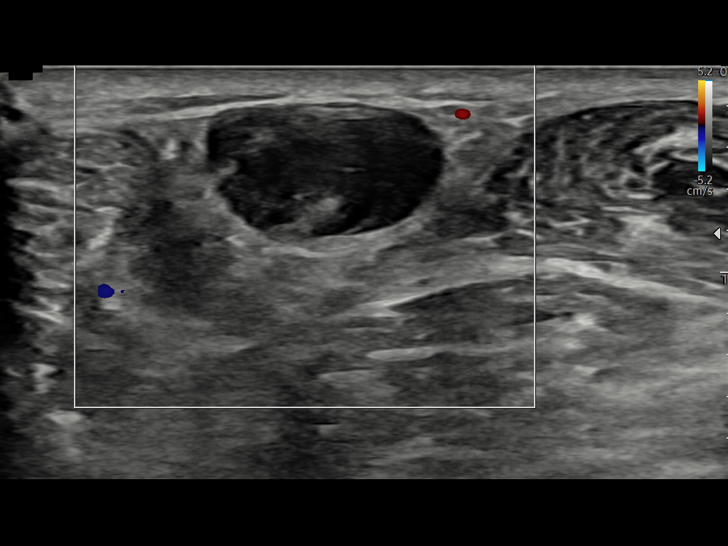
[im 11/11]
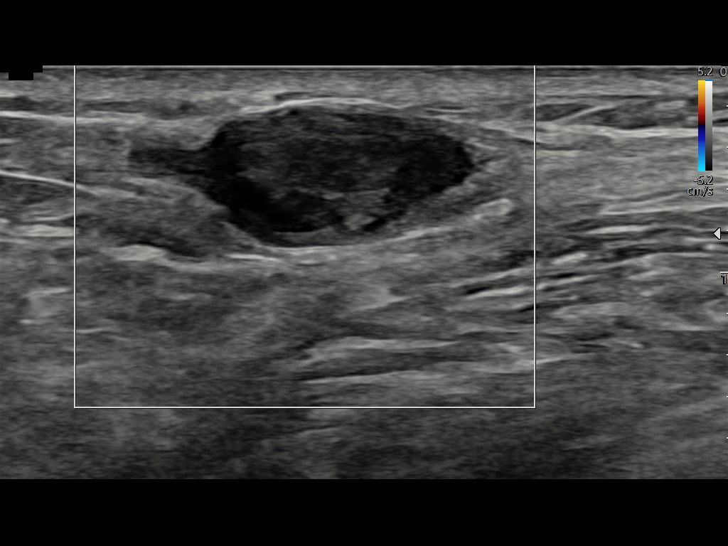

[11 of 11 positions shown; findings below may reference images not displayed]

FINDINGS: Focused ultrasound of the lateral right ankle at the site of the
palpable abnormality demonstrates a superficial round well-defined
heterogeneously hypoechoic mass with distinct margins and no
internal vascularity. The mass measures 1.4 x 0.7 x 1.2 cm. There
appears to be splitting of the fat planes at the edge of the mass,
best appreciated on the cine images.
IMPRESSION: 1. 1.4 cm superficial mass in the lateral right ankle at the site of
palpable abnormality. Sonographic characteristics are suggestive of
a peripheral nerve sheath tumor. An epidermal inclusion cyst could
have a similar appearance.

## 2022-06-01 IMAGING — CR DG ABDOMEN 1V
2 series · 2 of 2 positions shown · non-contrast
Comparison: Abdominal x-ray [DATE]. CT abdomen and pelvis
[DATE].

CLINICAL DATA: Preoperative.

EXAM:
ABDOMEN - 1 VIEW

[t abdomen supine (1 of 2)]
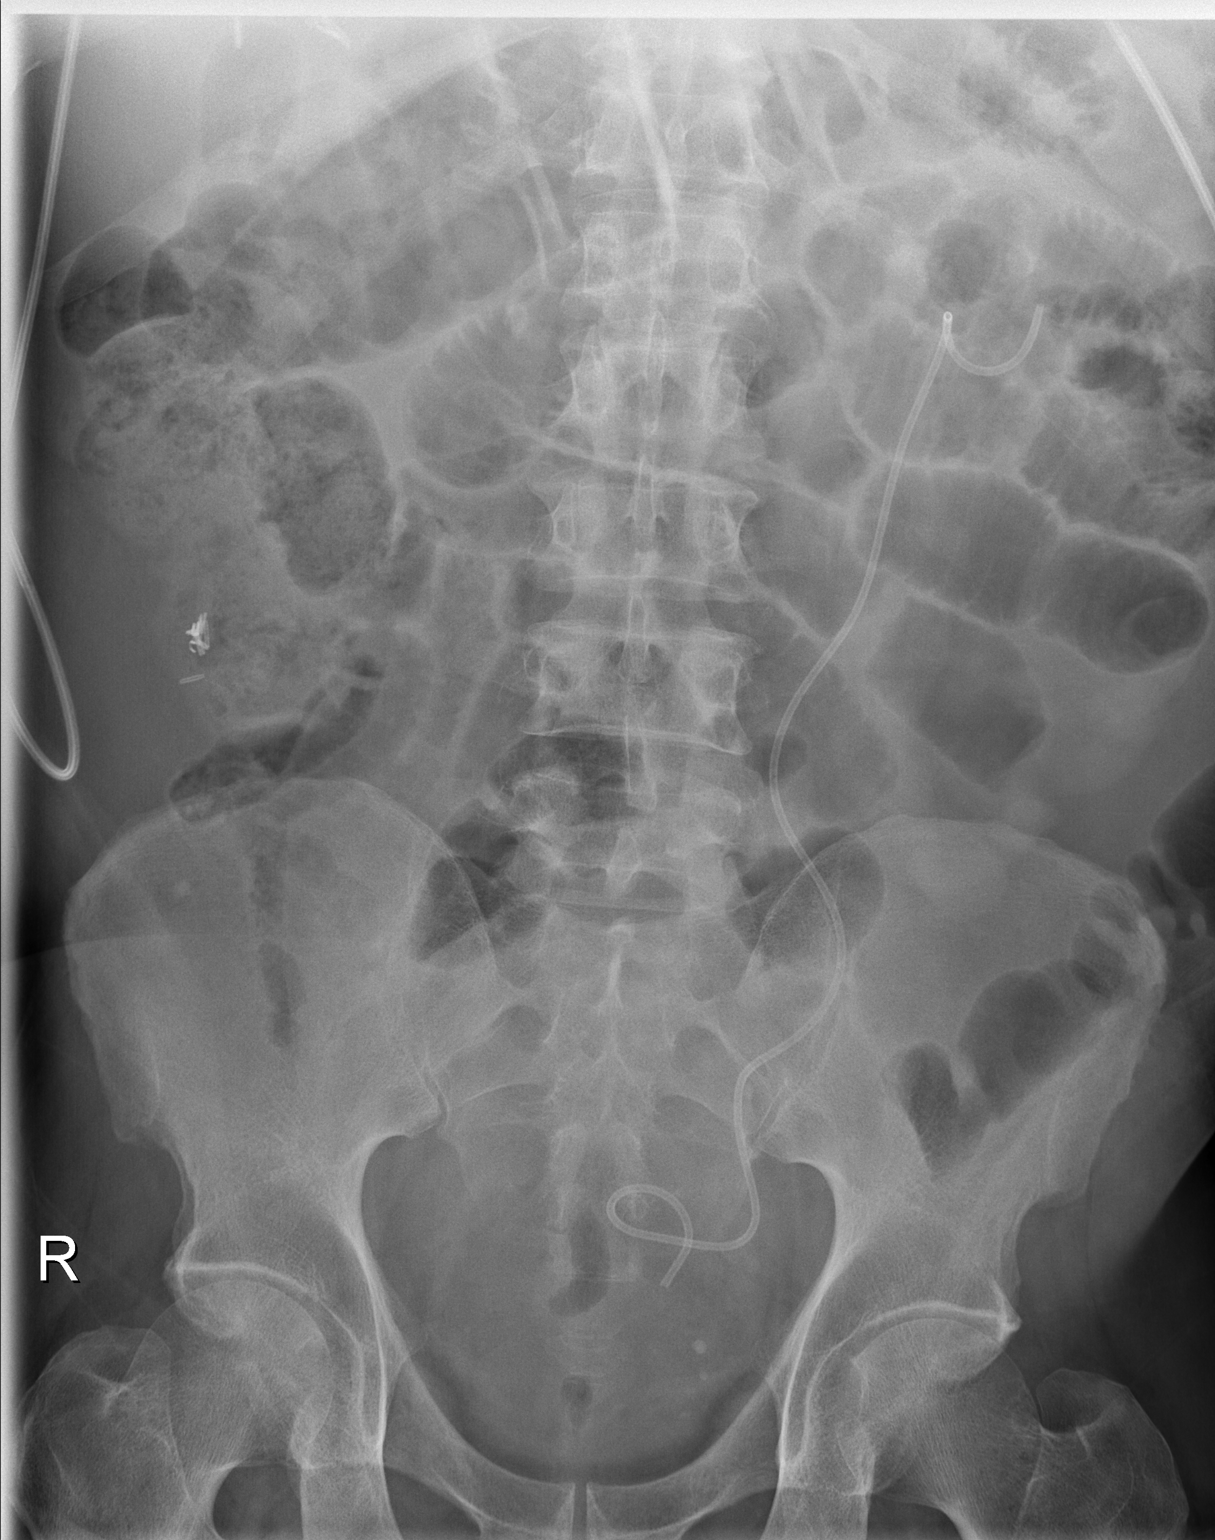

[t abdomen supine (2 of 2)]
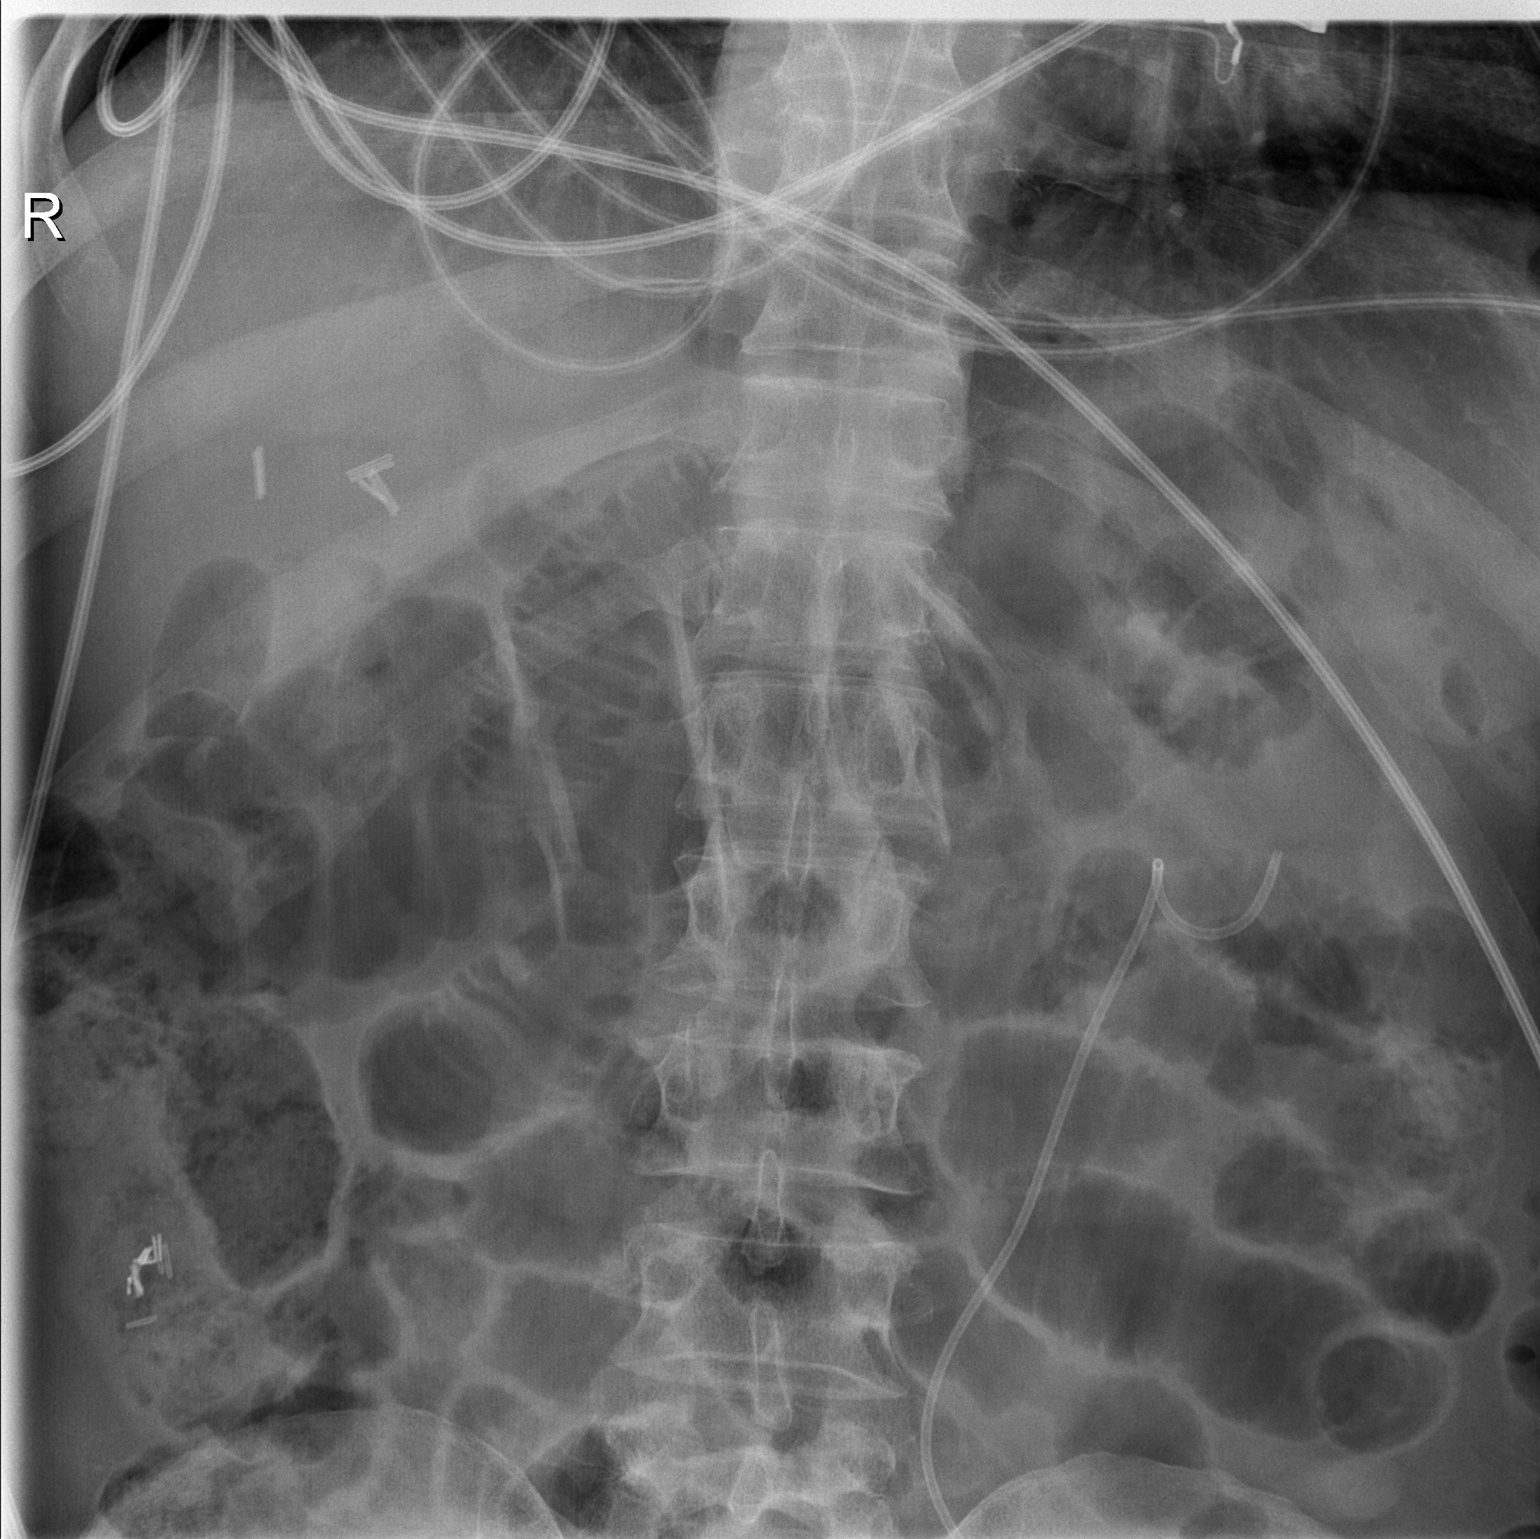

[2 of 2 positions shown; findings below may reference images not displayed]

FINDINGS: Left-sided ureteral stent appears unchanged in position. There
surgical clips in the right abdomen, unchanged from the prior
examination. There are new dilated small bowel loops measuring up to
3.8 cm throughout the mid and upper abdomen. There is air seen
throughout nondilated colon to the level of the rectum. No acute
fractures are seen. There are phleboliths in the left hemipelvis.
IMPRESSION: 1. New dilated mid and proximal small bowel concerning for small
bowel obstruction.
2. Left ureteral stent is unchanged in position.

## 2022-06-01 MED ORDER — SODIUM CHLORIDE 0.9 % IV SOLN
2.0000 g | Freq: Two times a day (BID) | INTRAVENOUS | Status: DC
  Administered 2022-06-01 – 2022-06-02 (×2): 2 g via INTRAVENOUS
  Filled 2022-06-01 (×2): qty 12.5

## 2022-06-01 MED ORDER — SODIUM CHLORIDE 0.9 % IV SOLN
2.0000 g | Freq: Once | INTRAVENOUS | Status: AC
  Administered 2022-06-01: 2 g via INTRAVENOUS
  Filled 2022-06-01: qty 20

## 2022-06-01 MED ORDER — DOCUSATE SODIUM 100 MG PO CAPS
100.0000 mg | ORAL_CAPSULE | Freq: Two times a day (BID) | ORAL | Status: DC
  Administered 2022-06-01 – 2022-06-06 (×9): 100 mg via ORAL
  Filled 2022-06-01 (×9): qty 1

## 2022-06-01 MED ORDER — POLYETHYLENE GLYCOL 3350 17 G PO PACK
17.0000 g | PACK | Freq: Every day | ORAL | Status: DC
  Administered 2022-06-01 – 2022-06-06 (×5): 17 g via ORAL
  Filled 2022-06-01 (×6): qty 1

## 2022-06-01 MED ORDER — POTASSIUM CHLORIDE 10 MEQ/100ML IV SOLN
10.0000 meq | INTRAVENOUS | Status: AC
  Administered 2022-06-01 (×2): 10 meq via INTRAVENOUS
  Filled 2022-06-01 (×2): qty 100

## 2022-06-01 MED ORDER — SODIUM CHLORIDE 0.9 % IV BOLUS
1000.0000 mL | Freq: Once | INTRAVENOUS | Status: AC
  Administered 2022-06-01: 1000 mL via INTRAVENOUS

## 2022-06-01 MED ORDER — HYDROMORPHONE HCL 1 MG/ML IJ SOLN
1.0000 mg | Freq: Once | INTRAMUSCULAR | Status: AC
  Administered 2022-06-01: 1 mg via INTRAVENOUS
  Filled 2022-06-01: qty 1

## 2022-06-01 MED ORDER — SODIUM CHLORIDE 0.9 % IV SOLN: INTRAVENOUS | Status: DC | PRN

## 2022-06-01 MED ORDER — INSULIN ASPART 100 UNIT/ML IJ SOLN
0.0000 [IU] | INTRAMUSCULAR | Status: DC
  Administered 2022-06-01: 1 [IU] via SUBCUTANEOUS
  Administered 2022-06-04 – 2022-06-06 (×4): 2 [IU] via SUBCUTANEOUS
  Administered 2022-06-06: 1 [IU] via SUBCUTANEOUS
  Filled 2022-06-01: qty 0.09

## 2022-06-01 MED ORDER — TAMSULOSIN HCL 0.4 MG PO CAPS
0.4000 mg | ORAL_CAPSULE | Freq: Every day | ORAL | Status: DC
  Administered 2022-06-01 – 2022-06-05 (×4): 0.4 mg via ORAL
  Filled 2022-06-01 (×5): qty 1

## 2022-06-01 NOTE — Assessment & Plan Note (Signed)
-  SIRS criteria met with  elevated white blood cell count,       Component Value Date/Time   WBC 15.0 (H) 06/01/2022 1557   LYMPHSABS 0.5 (L) 05/23/2022 0313   LYMPHSABS 0.7 09/27/2018 1455     tachycardia   ,   Today's Vitals   06/01/22 1700 06/01/22 1730 06/01/22 1800 06/01/22 1821  BP: 109/80 (!) 136/119 (!) 142/97   Pulse: 78 84 (!) 110   Resp: $Remo'19 12 16   'AgJlP$ Temp:    98.7 F (37.1 C)  TempSrc:    Oral  SpO2: 98% 100% 100%   Weight:      Height:      PainSc:          The recent clinical data is shown below. Vitals:   06/01/22 1700 06/01/22 1730 06/01/22 1800 06/01/22 1821  BP: 109/80 (!) 136/119 (!) 142/97   Pulse: 78 84 (!) 110   Resp: $Remo'19 12 16   'LIrVm$ Temp:    98.7 F (37.1 C)  TempSrc:    Oral  SpO2: 98% 100% 100%   Weight:      Height:          -Most likely source being  Urinary   Patient meeting criteria for Severe sepsis with    evidence of end organ damage/organ dysfunction such as    elevated lactic acid >2     Component Value Date/Time   LATICACIDVEN 3.0 (HH) 06/01/2022 1557       - Obtain serial lactic acid and procalcitonin level.  - Initiated IV antibiotics in ER: Antibiotics Given (last 72 hours)    Date/Time Action Medication Dose Rate   06/01/22 1650 New Bag/Given   cefTRIAXone (ROCEPHIN) 2 g in sodium chloride 0.9 % 100 mL IVPB 2 g 200 mL/hr      Will continue  on :cfepime   - await results of blood and urine culture  - Rehydrate aggressively  Intravenous fluids were administered,    6:36 PM

## 2022-06-01 NOTE — Assessment & Plan Note (Signed)
Allow permissive hypertension 

## 2022-06-01 NOTE — Assessment & Plan Note (Signed)
-   treat with cefepime       await results of urine culture and adjust antibiotic coverage as needed

## 2022-06-01 NOTE — Assessment & Plan Note (Signed)
Status post stenting Urology is aware contiune IV antibaiotcs  Cefepime per pharmacy Urology is aware

## 2022-06-01 NOTE — Assessment & Plan Note (Signed)
-   Order Sensitive SSI  °  ° -  check TSH and HgA1C °  ° ° °

## 2022-06-01 NOTE — Assessment & Plan Note (Signed)
Chronic stable continue Crestor

## 2022-06-01 NOTE — Assessment & Plan Note (Signed)
KUB concerning for small bowel obstruction will obtain CT to confirm.  Changed to n.p.o. for now.  If evidence of SBO will need general surgery consult in a.m. and possibly NG tube placement if there is any evidence of nausea at this point patient denies any nausea or significant abdominal pain although there is some abdominal distention and patient reports significant constipation

## 2022-06-01 NOTE — Assessment & Plan Note (Signed)
Order bowel regimen and KUB

## 2022-06-01 NOTE — Progress Notes (Addendum)
Began pre op appointment with patient. He stated he was feeling tired, weak and dizzy. BP 85/65, HR 101 BS 127. Patient with slight shakes. Stated normal BP is 130s/80s. Took amlodipine and lisinipril this morning aorund 0800. Started new antibiotic on 05/30/22 but cannot remember the name "starts with an A". Brought info to [place], [place]. Per recommendation brought patient to the ED via wheelchair.

## 2022-06-01 NOTE — Subjective & Objective (Signed)
Patient patient was brought in with hypotension while he was undergoing preop clearance at the surgery center he was found to have systolic blood pressure 85/88 and appeared to be somewhat short of breath and ill-appearing. Patient followed up by urology Dr. Shelby Mattocks.  He has an obstructing left ureteral stone measuring 5 mm noted on 09 May 2022 status post ureteral stent placement Has been experiencing urinary retention had a catheter placed and was removed on 5 June.  And was evaluated for this in the emergency department on the fifth He did take his amlodipine and lisinopril today.  Has been taking Keflex since the fifth Has been endorsing dysuria

## 2022-06-01 NOTE — Assessment & Plan Note (Signed)
Right ankle with small painful nodule will order [place]

## 2022-06-01 NOTE — Assessment & Plan Note (Signed)
Obtain urine electrolytes urology recommends postvoid residual measurements in case patient has any count or retention.

## 2022-06-01 NOTE — ED Notes (Signed)
RN made aware of high BP.

## 2022-06-01 NOTE — Consult Note (Signed)
Consultation: Tachycardia, hypotension, retained left ureteral stent/stone, penile pain Requested by: Dr. Milton Ferguson  History of Present Illness: Rickey Cruz is a 61 year old male that had a difficult time recently.  He underwent shockwave of the left lower pole stone and a fragment caused obstruction of the left ureter and he had uncontrolled pain.  He also has a history of a very large prostate and a large median lobe.  He was taken by Dr. Abner Greenspan 05/22/2022 for cystoscopy TURP of the median lobe to access the left ureteral orifice, attempted left ureteroscopy with left ureteral stent placement.  The patient had some postoperative bleeding issues expected from a large prostate and a TURP.  He did pass a voiding trial in the office 05/30/2022.  Because of pain and other issues he underwent a CT scan on 05/28/2022 and 05/30/2022 which showed this left stent is in good position.  The stone fragment did migrate down to the distal left ureter.  He was also recently started on tamsulosin.  He was seen in our office 05/31/2022 with some voiding issues and he was found to be voiding and a post void of 190 mL.  He has been on cephalexin twice daily because of the catheter removal.  There was no specific culture in our office.  He is scheduled for left ureteroscopy and appropriately Dr. Claudia Desanctis is trying to give it as much time as possible to allow the ureter to passively dilate.  The patient went today to preoperative clinic and was noted to be tachycardic with low blood pressure.  He was sent to the emergency department.  There is lactic acid was 3, creatinine had bumped to 1.84 and his white count was 15.  When I saw him he was voiding.  He did say he had some penile pain with urination and wonder if the stone had passed into his penis  I told him it was possible but typically the stone would not pass when the stent is in place.  He denied painful inability to urinate.  We talked about a Foley catheter but he immediately said no  way.  He also complained of abdominal bloating.  A KUB was obtained today which showed the left stent in good position.  There was newly dilated mid and proximal small bowel concerning for ileus or developing small bowel obstruction.  He also has not had a bowel movement in 3 days.  Past Medical History:  Diagnosis Date   Allergy    Flonase, Patanol, Zyrtec   Arthritis    psoriasis   Asthma    seasonal, with allergies   Borderline diabetes    Cluster headache    Colon polyp 10/27/2011   Depression    denies 11/11/16; denies 11/21/18 "been years"   Eczema    GERD (gastroesophageal reflux disease)    History of kidney stones    x1   Hyperlipidemia    Hypertension    Low back pain    "in kidney area-was told that it was related to gallstones"   Prediabetes    Prostate cancer ([place])    Psoriasis    [name]   Severe sleep apnea    h/o; had procedure and weight control, does not have to wear CPAP   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 Repeat 5 years.   CYSTOSCOPY WITH RETROGRADE PYELOGRAM, URETEROSCOPY AND STENT PLACEMENT Left 05/22/2022   Procedure: CYSTOSCOPY WITH RETROGRADE PYELOGRAM, URETEROSCOPY AND STENT PLACEMENT; TRANSURETHRAL RESECTION OF PROSTATE;  Surgeon: Janith Lima, MD;  Location: [facility];  Service: Urology;  Laterality: Left;   EXTRACORPOREAL SHOCK WAVE LITHOTRIPSY Left 05/09/2022   Procedure: EXTRACORPOREAL SHOCK WAVE LITHOTRIPSY (ESWL);  Surgeon: Alexis Frock, MD;  Location: [facility];  Service: Urology;  Laterality: Left;   POLYPECTOMY     PROSTATE BIOPSY  10/2014   will have another in 3 months    SEPTOPLASTY  2014   TONSILLECTOMY  2014   UVULOPALATOPHARYNGOPLASTY  2014    Home Medications:  (Not in a hospital admission)  Allergies: No Known Allergies  Family History  Problem Relation Age of Onset   Stroke Mother    Asthma Sister    Colon cancer Neg Hx    Esophageal cancer Neg Hx    Stomach cancer Neg Hx    Prostate cancer Neg Hx    Diabetes Neg Hx    CAD Neg Hx    Migraines Neg Hx    Headache Neg Hx    Social History:  reports that he has never smoked. He has never used smokeless tobacco. He reports that he does not drink alcohol and does not use drugs.  ROS: A complete review of systems was performed.  All systems are negative except for pertinent findings as noted. Review of Systems  Gastrointestinal:  Positive for constipation.  Genitourinary:  Positive for dysuria.  All other systems reviewed and are negative.   Physical Exam:  Vital signs in last 24 hours: Temp:  [98.7 F (37.1 C)-99.1 F (37.3 C)] 98.7 F (37.1 C) (06/07 1821) Pulse Rate:  [75-110] 75 (06/07 2030) Resp:  [10-24] 10 (06/07 2030) BP: (85-142)/(65-119) 127/87 (06/07 2030) SpO2:  [95 %-100 %] 99 % (06/07 2030) Weight:  [82.6 kg] 82.6 kg (06/07 1414) General:  Alert and oriented, No acute distress HEENT: Normocephalic, atraumatic Cardiovascular: Regular rate and rhythm Lungs: Regular rate and effort Abdomen: Soft, nontender, mildly distended, no abdominal masses Back: No CVA tenderness Extremities: No edema Neurologic: Grossly intact GU: Urine in urinal is clear without clots.  Laboratory Data:  Results for orders placed or performed during the hospital encounter of 06/01/22 (from the past 24 hour(s))  Urinalysis, Routine w reflex microscopic     Status: Abnormal   Collection Time: 06/01/22  3:02 PM  Result Value Ref Range   Color, Urine AMBER (A) YELLOW   APPearance CLOUDY (A) CLEAR   Specific Gravity, Urine 1.018 1.005 - 1.030   pH 5.0 5.0 - 8.0   Glucose, UA NEGATIVE NEGATIVE  mg/dL   Hgb urine dipstick LARGE (A) NEGATIVE   Bilirubin Urine NEGATIVE NEGATIVE   Ketones, ur NEGATIVE NEGATIVE mg/dL   Protein, ur 100 (A) NEGATIVE mg/dL   Nitrite NEGATIVE NEGATIVE   Leukocytes,Ua MODERATE (A) NEGATIVE   RBC / HPF >50 (H) 0 - 5 RBC/hpf   WBC, UA >50 (H) 0 - 5 WBC/hpf   Bacteria, UA RARE (A) NONE SEEN   WBC Clumps PRESENT    Mucus PRESENT    Hyaline Casts, UA PRESENT   CBC     Status: Abnormal   Collection Time: 06/01/22  3:57 PM  Result Value Ref Range   WBC 15.0 (H) 4.0 -  10.5 K/uL   RBC 4.32 4.22 - 5.81 MIL/uL   Hemoglobin 13.3 13.0 - 17.0 g/dL   HCT 38.7 (L) 39.0 - 52.0 %   MCV 89.6 80.0 - 100.0 fL   MCH 30.8 26.0 - 34.0 pg   MCHC 34.4 30.0 - 36.0 g/dL   RDW 12.2 11.5 - 15.5 %   Platelets 264 150 - 400 K/uL   nRBC 0.0 0.0 - 0.2 %  Basic metabolic panel     Status: Abnormal   Collection Time: 06/01/22  3:57 PM  Result Value Ref Range   Sodium 133 (L) 135 - 145 mmol/L   Potassium 3.2 (L) 3.5 - 5.1 mmol/L   Chloride 97 (L) 98 - 111 mmol/L   CO2 23 22 - 32 mmol/L   Glucose, Bld 108 (H) 70 - 99 mg/dL   BUN 26 (H) 6 - 20 mg/dL   Creatinine, Ser 1.84 (H) 0.61 - 1.24 mg/dL   Calcium 8.7 (L) 8.9 - 10.3 mg/dL   GFR, Estimated 41 (L) >60 mL/min   Anion gap 13 5 - 15  Hepatic function panel     Status: Abnormal   Collection Time: 06/01/22  3:57 PM  Result Value Ref Range   Total Protein 7.3 6.5 - 8.1 g/dL   Albumin 3.6 3.5 - 5.0 g/dL   AST 53 (H) 15 - 41 U/L   ALT 54 (H) 0 - 44 U/L   Alkaline Phosphatase 61 38 - 126 U/L   Total Bilirubin 0.8 0.3 - 1.2 mg/dL   Bilirubin, Direct 0.1 0.0 - 0.2 mg/dL   Indirect Bilirubin 0.7 0.3 - 0.9 mg/dL  Lactic acid, plasma     Status: Abnormal   Collection Time: 06/01/22  3:57 PM  Result Value Ref Range   Lactic Acid, Venous 3.0 (HH) 0.5 - 1.9 mmol/L  Lactic acid, plasma     Status: None   Collection Time: 06/01/22  6:10 PM  Result Value Ref Range   Lactic Acid, Venous 1.0 0.5 - 1.9 mmol/L  Sodium, urine, random      Status: None   Collection Time: 06/01/22  7:57 PM  Result Value Ref Range   Sodium, Ur 35 mmol/L  CBG monitoring, ED     Status: Abnormal   Collection Time: 06/01/22  9:16 PM  Result Value Ref Range   Glucose-Capillary 116 (H) 70 - 99 mg/dL   No results found for this or any previous visit (from the past 240 hour(s)). Creatinine: Recent Labs    05/28/22 1400 05/30/22 2011 06/01/22 1557  CREATININE 1.03 1.15 1.84*    Impression/Assessment:  Tachycardia, hypotension-this could be due to infection could also be due to poor p.o. intake due to pain.  Plan:  Appreciate hospitalist admission and resuscitation.  We will follow along.  I notified Dr. Claudia Desanctis of admission.  At this point I do not think he needs any specific additional imaging from a urologic standpoint.  Agree with bowel regimen and cefepime.  Festus Aloe 06/01/2022, 9:37 PM

## 2022-06-01 NOTE — H&P (Addendum)
D'Lo MSX:[id number] DOB: [birth year] DOA: 06/01/2022     PCP: Horald Pollen, MD   Outpatient Specialists:  Urology Dr.  Lenise Arena  Patient arrived to ER on 06/01/22 at 1357 Referred by Attending Toy Baker, MD   Patient coming from:    home Lives alone,       Chief Complaint:   Chief Complaint  Patient presents with   Hypotension    HPI: Rickey Cruz is a 61 y.o. male with medical history significant of HTN, pre DM2, HLD, kidney stones    Presented with   LOW bp Patient patient was brought in with hypotension while he was undergoing preop clearance at the surgery center he was found to have systolic blood pressure 23/36 and appeared to be somewhat short of breath and ill-appearing. Patient followed up by urology Dr. Shelby Mattocks.  He has an obstructing left ureteral stone measuring 5 mm noted on 09 May 2022 status post ureteral stent placement Has been experiencing urinary retention had a catheter placed and was removed on 5 June.  And was evaluated for this in the emergency department on the fifth He did take his amlodipine and lisinopril today.  Has been taking Keflex since the fifth Has been endorsing dysuria  When he presented originally he was unable to urinate due to severe Pain in his penis now improved, able to urinate again    Report no BM for 3 days   Regarding pertinent Chronic problems:     Hyperlipidemia -  on statins cRESTROR Lipid Panel     Component Value Date/Time   CHOL 110 01/12/2022 1431   CHOL 100 06/05/2020 1203   TRIG 150.0 (H) 01/12/2022 1431   HDL 36.20 (L) 01/12/2022 1431   HDL 39 (L) 06/05/2020 1203   CHOLHDL 3 01/12/2022 1431   VLDL 30.0 01/12/2022 1431   LDLCALC 44 01/12/2022 1431   LDLCALC 39 06/05/2020 1203   LABVLDL 22 06/05/2020 1203     HTN on NOrvasc lisinopril       DM 2 -  Lab Results  Component Value Date   HGBA1C 6.3 (H) 05/22/2022    diet controlled   BPH - on Flomax, Proscar     While in ER:   Noted  to be hypotensive on arrival responded with IV fluids noted UA significant for evidence of UTI.  Started on Rocephin urology was consulted recommend admission to medicine bladder scan to rule out obstruction     CTabd/pelvis 6/5- IMPRESSION: 1. Left-sided ureteral stent remains in place. Previous small stone in the left distal ureter adjacent to the stent is unchanged. No hydronephrosis. 2. Nonobstructing stone in the lower pole of the left kidney. 3. Urinary bladder is essentially empty, the Foley catheter has been removed. 4. Markedly enlarged prostate gland causing mass effect on the bladder base.  IMPRESSION: 1. Left-sided ureteral stent remains in place. Previous small stone in the left distal ureter adjacent to the stent is unchanged. No hydronephrosis. 2. Nonobstructing stone in the lower pole of the left kidney. 3. Urinary bladder is essentially empty, the Foley catheter has been removed. 4. Markedly enlarged prostate gland causing mass effect on the bladder base.     Following Medications were ordered in ER: Medications  sodium chloride 0.9 % bolus 1,000 mL (0 mLs Intravenous Stopped 06/01/22 1654)  cefTRIAXone (ROCEPHIN) 2 g in sodium chloride 0.9 % 100 mL IVPB (0 g Intravenous Stopped 06/01/22 1751)  sodium chloride 0.9 % bolus 1,000 mL (0 mLs  Intravenous Stopped 06/01/22 1751)  HYDROmorphone (DILAUDID) injection 1 mg (1 mg Intravenous Given 06/01/22 1759)    _______________________________________________________ ER Provider Called:   Urology Dr. Junious Silk They Recommend admit to medicine  Will see in AM     ED Triage Vitals  Enc Vitals Group     BP 06/01/22 1414 96/78     Pulse Rate 06/01/22 1414 86     Resp 06/01/22 1414 13     Temp 06/01/22 1414 99.1 F (37.3 C)     Temp Source 06/01/22 1414 Oral     SpO2 06/01/22 1414 98 %     Weight 06/01/22 1414 182 lb (82.6 kg)     Height 06/01/22 1414 5' 6"  (1.676 m)     Head Circumference --      Peak Flow --      Pain  Score 06/01/22 1423 0     Pain Loc --      Pain Edu? --      Excl. in [place]? --   TMAX(24)@     _________________________________________ Significant initial  Findings: Abnormal Labs Reviewed  URINALYSIS, ROUTINE W REFLEX MICROSCOPIC - Abnormal; Notable for the following components:      Result Value   Color, Urine AMBER (*)    APPearance CLOUDY (*)    Hgb urine dipstick LARGE (*)    Protein, ur 100 (*)    Leukocytes,Ua MODERATE (*)    RBC / HPF >50 (*)    WBC, UA >50 (*)    Bacteria, UA RARE (*)    All other components within normal limits  CBC - Abnormal; Notable for the following components:   WBC 15.0 (*)    HCT 38.7 (*)    All other components within normal limits  BASIC METABOLIC PANEL - Abnormal; Notable for the following components:   Sodium 133 (*)    Potassium 3.2 (*)    Chloride 97 (*)    Glucose, Bld 108 (*)    BUN 26 (*)    Creatinine, Ser 1.84 (*)    Calcium 8.7 (*)    GFR, Estimated 41 (*)    All other components within normal limits  HEPATIC FUNCTION PANEL - Abnormal; Notable for the following components:   AST 53 (*)    ALT 54 (*)    All other components within normal limits  LACTIC ACID, PLASMA - Abnormal; Notable for the following components:   Lactic Acid, Venous 3.0 (*)    All other components within normal limits      ECG: Ordered Personally reviewed by me showing: HR : 96 Rhythm: Sinus rhythm Atrial premature complex Inferior infarct, age indeterminate QTC 412   ____________________ This patient meets SIRS Criteria and may be septic.     The recent clinical data is shown below. Vitals:   06/01/22 1700 06/01/22 1730 06/01/22 1800 06/01/22 1821  BP: 109/80 (!) 136/119 (!) 142/97   Pulse: 78 84 (!) 110   Resp: 19 12 16    Temp:    98.7 F (37.1 C)  TempSrc:    Oral  SpO2: 98% 100% 100%   Weight:      Height:          WBC     Component Value Date/Time   WBC 15.0 (H) 06/01/2022 1557   LYMPHSABS 0.5 (L) 05/23/2022 0313   LYMPHSABS  0.7 09/27/2018 1455   MONOABS 0.6 05/23/2022 0313   EOSABS 0.0 05/23/2022 0313   EOSABS 0.0 09/27/2018 1455  BASOSABS 0.0 05/23/2022 0313   BASOSABS 0.0 09/27/2018 1455        Lactic Acid, Venous    Component Value Date/Time   LATICACIDVEN 1.0 06/01/2022 1810     Procalcitonin   Ordered     UA  evidence of UTI    Urine analysis:    Component Value Date/Time   COLORURINE AMBER (A) 06/01/2022 1502   APPEARANCEUR CLOUDY (A) 06/01/2022 1502   APPEARANCEUR Clear 11/01/2018 1112   LABSPEC 1.018 06/01/2022 1502   PHURINE 5.0 06/01/2022 1502   GLUCOSEU NEGATIVE 06/01/2022 1502   GLUCOSEU NEGATIVE 01/12/2022 1431   HGBUR LARGE (A) 06/01/2022 1502   BILIRUBINUR NEGATIVE 06/01/2022 1502   BILIRUBINUR Negative 11/01/2018 1112   KETONESUR NEGATIVE 06/01/2022 1502   PROTEINUR 100 (A) 06/01/2022 1502   UROBILINOGEN 0.2 05/18/2022 1343   NITRITE NEGATIVE 06/01/2022 1502   LEUKOCYTESUR MODERATE (A) 06/01/2022 1502    Results for orders placed or performed during the hospital encounter of 05/18/22  Urine Culture     Status: None   Collection Time: 05/18/22  2:00 PM   Specimen: Urine, Clean Catch  Result Value Ref Range Status   Specimen Description URINE, CLEAN CATCH  Final   Special Requests NONE  Final   Culture   Final    NO GROWTH Performed at [facility], [place] [address] [place], [place] [address]    Report Status 05/19/2022 FINAL  Final     _______________________________________________ Hospitalist was called for admission for   Acute sepsis ([place])  Uti    The following Work up has been ordered so far:  Orders Placed This Encounter  Procedures   Urine Culture   Urinalysis, Routine w reflex microscopic   CBC   Basic metabolic panel   Hepatic function panel   Lactic acid, plasma   Maintain IV access, Saline Lock IV (if fever)   Bladder scan   Cardiac monitoring   Consult to urology   Consult to hospitalist   EKG 12-Lead   Admit to Inpatient  (patient's expected length of stay will be greater than 2 midnights or inpatient only procedure)     OTHER Significant initial  Findings:  labs showing:    Recent Labs  Lab 05/28/22 1400 05/30/22 2011 06/01/22 1557  NA 137 134* 133*  K 3.3* 3.8 3.2*  CO2 23 20* 23  GLUCOSE 105* 165* 108*  BUN 12 12 26*  CREATININE 1.03 1.15 1.84*  CALCIUM 8.9 8.8* 8.7*    Cr    Up from baseline see below Lab Results  Component Value Date   CREATININE 1.84 (H) 06/01/2022   CREATININE 1.15 05/30/2022   CREATININE 1.03 05/28/2022    Recent Labs  Lab 06/01/22 1557  AST 53*  ALT 54*  ALKPHOS 61  BILITOT 0.8  PROT 7.3  ALBUMIN 3.6   Lab Results  Component Value Date   CALCIUM 8.7 (L) 06/01/2022   PHOS 3.1 05/24/2022          Plt: Lab Results  Component Value Date   PLT 264 06/01/2022       COVID-19 Labs  No results for input(s): DDIMER, FERRITIN, LDH, CRP in the last 72 hours.  Lab Results  Component Value Date   [place] NEGATIVE 08/28/2019         Recent Labs  Lab 05/28/22 1400 05/30/22 2011 06/01/22 1557  WBC 6.4 12.6* 15.0*  HGB 13.7 13.8 13.3  HCT 39.2 39.3 38.7*  MCV 87.5 87.1 89.6  PLT 226 272 264    HG/HCT  stable,       Component Value Date/Time   HGB 13.3 06/01/2022 1557   HGB 16.6 12/06/2019 1506   HCT 38.7 (L) 06/01/2022 1557   HCT 47.9 12/06/2019 1506   MCV 89.6 06/01/2022 1557   MCV 90 12/06/2019 1506      Cardiac Panel (last 3 results) No results for input(s): CKTOTAL, CKMB, TROPONINI, RELINDX in the last 72 hours.  .car BNP (last 3 results) No results for input(s): BNP in the last 8760 hours.    DM  labs:  HbA1C: Recent Labs    07/07/21 1454 01/12/22 1431 05/22/22 0329  HGBA1C 6.0 6.0 6.3*       CBG (last 3)  No results for input(s): GLUCAP in the last 72 hours.        Cultures:    Component Value Date/Time   SDES URINE, CLEAN CATCH 05/18/2022 1400   SPECREQUEST NONE 05/18/2022 1400   CULT  05/18/2022  1400    NO GROWTH Performed at [facility], [place] [address], [place], [place] [address]    REPTSTATUS 05/19/2022 FINAL 05/18/2022 1400     Radiological Exams on Admission: CT Renal Stone Study  Result Date: 05/30/2022 CLINICAL DATA:  Flank pain, kidney stone suspected Patient reports catheter removed this morning, has not yet herniated since that time. EXAM: CT ABDOMEN AND PELVIS WITHOUT CONTRAST TECHNIQUE: Multidetector CT imaging of the abdomen and pelvis was performed following the standard protocol without IV contrast. RADIATION DOSE REDUCTION: This exam was performed according to the departmental dose-optimization program which includes automated exposure control, adjustment of the mA and/or kV according to patient size and/or use of iterative reconstruction technique. COMPARISON:  CT 2 days ago 05/28/2022 FINDINGS: Lower chest: Clear lung bases.  No acute findings. Hepatobiliary: Borderline hepatic steatosis. No focal liver abnormality. Clips in the gallbladder fossa postcholecystectomy. No biliary dilatation. Pancreas: No ductal dilatation or inflammation. Spleen: Normal in size without focal abnormality. Adrenals/Urinary Tract: No adrenal nodule. Left-sided ureteral stent remains in place. Proximal pigtail in the renal pelvis, distal pigtail in the bladder. There is no hydronephrosis. Previous small stone in the left distal ureter adjacent to the stent series 2, image 67, without significant interval change. Nonobstructing stone in the lower pole of the left kidney. Small low-density lesion in the left upper pole is likely a cyst, but incompletely characterized due to size and lack of the IV contrast. No right hydronephrosis, renal calculi or perinephric edema. Urinary bladder is essentially empty, and not well assessed. Foley catheter has been removed. Stomach/Bowel: Stomach is within normal limits. Appendectomy. No evidence of bowel wall thickening, distention, or inflammatory changes.  Vascular/Lymphatic: Mild aortic atherosclerosis. No aortic aneurysm. No portal venous or mesenteric gas. No abdominopelvic adenopathy. Reproductive: Markedly enlarged prostate gland causing mass effect on the bladder base. Other: No free air, free fluid, or intra-abdominal fluid collection. Musculoskeletal: No free air, free fluid, or intra-abdominal fluid collection. IMPRESSION: 1. Left-sided ureteral stent remains in place. Previous small stone in the left distal ureter adjacent to the stent is unchanged. No hydronephrosis. 2. Nonobstructing stone in the lower pole of the left kidney. 3. Urinary bladder is essentially empty, the Foley catheter has been removed. 4. Markedly enlarged prostate gland causing mass effect on the bladder base. Aortic Atherosclerosis (ICD10-I70.0). Electronically Signed   By: Keith Rake M.D.   On: 05/30/2022 20:51   _______________________________________________________________________________________________________ Latest  Blood pressure (!) 142/97, pulse (!) 110, temperature  98.7 F (37.1 C), temperature source Oral, resp. rate 16, height 5' 6"  (1.676 m), weight 82.6 kg, SpO2 100 %.   Vitals  labs and radiology finding personally reviewed  Review of Systems:    Pertinent positives include:   fatigue, Constitutional:  No weight loss, night sweats, Fevers, chills,  weight loss  HEENT:  No headaches, Difficulty swallowing,Tooth/dental problems,Sore throat,  No sneezing, itching, ear ache, nasal congestion, post nasal drip,  Cardio-vascular:  No chest pain, Orthopnea, PND, anasarca, dizziness, palpitations.no Bilateral lower extremity swelling  GI:  No heartburn, indigestion, abdominal pain, nausea, vomiting, diarrhea, change in bowel habits, loss of appetite, melena, blood in stool, hematemesis Resp:  no shortness of breath at rest. No dyspnea on exertion, No excess mucus, no productive cough, No non-productive cough, No coughing up of blood.No change in color  of mucus.No wheezing. Skin:  no rash or lesions. No jaundice GU:  no dysuria, change in color of urine, no urgency or frequency. No straining to urinate.  No flank pain.  Musculoskeletal:  No joint pain or no joint swelling. No decreased range of motion. No back pain.  Psych:  No change in mood or affect. No depression or anxiety. No memory loss.  Neuro: no localizing neurological complaints, no tingling, no weakness, no double vision, no gait abnormality, no slurred speech, no confusion  All systems reviewed and apart from [place] all are negative _______________________________________________________________________________________________ Past Medical History:   Past Medical History:  Diagnosis Date   Allergy    Flonase, Patanol, Zyrtec   Arthritis    psoriasis   Asthma    seasonal, with allergies   Borderline diabetes    Cluster headache    Colon polyp 10/27/2011   Depression    denies 11/11/16; denies 11/21/18 "been years"   Eczema    GERD (gastroesophageal reflux disease)    History of kidney stones    x1   Hyperlipidemia    Hypertension    Low back pain    "in kidney area-was told that it was related to gallstones"   Prediabetes    Prostate cancer ([place])    Psoriasis    [name]   Severe sleep apnea    h/o; had procedure and weight control, does not have to wear CPAP   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 Repeat 5 years.   CYSTOSCOPY WITH RETROGRADE PYELOGRAM, URETEROSCOPY AND STENT PLACEMENT Left 05/22/2022   Procedure: CYSTOSCOPY WITH RETROGRADE PYELOGRAM, URETEROSCOPY AND STENT PLACEMENT; TRANSURETHRAL RESECTION OF  PROSTATE;  Surgeon: Janith Lima, MD;  Location: [facility];  Service: Urology;  Laterality: Left;   EXTRACORPOREAL SHOCK WAVE LITHOTRIPSY Left 05/09/2022   Procedure: EXTRACORPOREAL SHOCK WAVE LITHOTRIPSY (ESWL);  Surgeon: Alexis Frock, MD;  Location: [facility];  Service: Urology;  Laterality: Left;   POLYPECTOMY     PROSTATE BIOPSY  10/2014   will have another in 3 months   SEPTOPLASTY  2014   TONSILLECTOMY  2014   UVULOPALATOPHARYNGOPLASTY  2014    Social History:  Ambulatory   independently       reports that he has never smoked. He has never used smokeless tobacco.  He reports that he does not drink alcohol and does not use drugs.     Family History:   Family History  Problem Relation Age of Onset   Stroke Mother    Asthma Sister    Colon cancer Neg Hx    Esophageal cancer Neg Hx    Stomach cancer Neg Hx    Prostate cancer Neg Hx    Diabetes Neg Hx    CAD Neg Hx    Migraines Neg Hx    Headache Neg Hx    ______________________________________________________________________________________________ Allergies: No Known Allergies   Prior to Admission medications   Medication Sig Start Date End Date Taking? Authorizing Provider  albuterol (VENTOLIN HFA) 108 (90 Base) MCG/ACT inhaler INHALE 2 PUFFS INTO THE LUNGS EVERY 4 HOURS AS NEEDED FOR WHEEZING OR SHORTNESS OF BREATH(COUGHING FITS) Patient taking differently: Inhale 2 puffs into the lungs every 4 (four) hours as needed for wheezing or shortness of breath. 12/21/21  Yes Kozlow, Donnamarie Poag, MD  amLODipine (NORVASC) 5 MG tablet TAKE 1 TABLET(5 MG) BY MOUTH DAILY Patient taking differently: Take 5 mg by mouth daily. 12/21/21  Yes Sagardia, Ines Bloomer, MD  Cephalexin 500 MG tablet Take 500 mg by mouth 2 (two) times daily. 05/30/22  Yes [provider]  finasteride (PROSCAR) 5 MG tablet Take 1 tablet (5 mg total) by mouth daily. 05/25/22  Yes Lorella Nimrod, MD  lisinopril (ZESTRIL) 40 MG tablet Take 1  tablet (40 mg total) by mouth daily. 07/07/21  Yes Sagardia, Ines Bloomer, MD  ondansetron (ZOFRAN-ODT) 4 MG disintegrating tablet 23m ODT q4 hours prn nausea/vomit Patient taking differently: Take 4 mg by mouth every 4 (four) hours as needed for nausea or vomiting. 05/18/22  Yes ZMilton Ferguson MD  oxybutynin (DITROPAN) 5 MG tablet Take 1 tablet (5 mg total) by mouth every 8 (eight) hours as needed for bladder spasms. 05/24/22  Yes PJacalyn LefevreD, MD  oxyCODONE-acetaminophen (PERCOCET/ROXICET) 5-325 MG tablet Take 1 tablet by mouth every 6 (six) hours as needed for severe pain. 05/18/22  Yes [provider]  rosuvastatin (CRESTOR) 10 MG tablet TAKE 1 TABLET(10 MG) BY MOUTH DAILY Patient taking differently: Take 10 mg by mouth daily. 02/14/22  Yes Sagardia, MInes Bloomer MD  Budeson-Glycopyrrol-Formoterol (BREZTRI AEROSPHERE) 160-9-4.8 MCG/ACT AERO Inhale two puffs with spacer twice daily to prevent cough or wheeze.  Rinse, gargle, and spit after use. 04/27/22   Kozlow, EDonnamarie Poag MD  clobetasol ointment (TEMOVATE) 05.97% Apply 1 application. topically 2 (two) times daily as needed (psoriasis). 05/30/22   [provider]  ketorolac (TORADOL) 10 MG tablet Take 10 mg by mouth every 8 (eight) hours as needed for moderate pain or severe pain. 05/31/22   [provider]  tamsulosin (FLOMAX) 0.4 MG CAPS capsule Take 1 capsule (0.4 mg total) by mouth at bedtime. 05/24/22   PJacalyn LefevreD, MD    ___________________________________________________________________________________________________ Physical Exam:    06/01/2022    6:00 PM 06/01/2022    5:30 PM 06/01/2022    5:00 PM  Vitals with BMI  Systolic [id number] Diastolic 97 [id number]  Pulse 110 84 78     1. General:  in No  Acute distress   well   -appearing 2. Psychological: Alert and   Oriented 3. Head/ENT:     Dry Mucous Membranes                          Head  Non traumatic, neck supple                          Poor  Dentition 4. SKIN:  decreased Skin turgor,  Skin clean Dry and intact no rash 5. Heart: Regular rate and rhythm no  Murmur, no Rub or gallop 6. Lungs: no wheezes or crackles   7. Abdomen: Soft,  non-tender, distended   obese  bowel sounds present 8. Lower extremities: no clubbing, cyanosis, no  edema, right ankle 1-2 cm nodule 9. Neurologically Grossly intact, moving all 4 extremities equally   10. MSK: Normal range of motion    Chart has been reviewed  ______________________________________________________________________________________________  Assessment/Plan 61 y.o. male with medical history significant of HTN, pre DM2, HLD, kidney stones     Admitted for   Acute sepsis ([place])    Present on Admission:  Sepsis ([place])  Essential hypertension  HLD (hyperlipidemia)  History of kidney stones  AKI (acute kidney injury) ([place])  Hypokalemia  Right ankle pain  Constipation  UTI (urinary tract infection)  SBO (small bowel obstruction) ([place])     Sepsis ([place])  -SIRS criteria met with  elevated white blood cell count,       Component Value Date/Time   WBC 15.0 (H) 06/01/2022 1557   LYMPHSABS 0.5 (L) 05/23/2022 0313   LYMPHSABS 0.7 09/27/2018 1455     tachycardia   ,   Today's Vitals   06/01/22 1700 06/01/22 1730 06/01/22 1800 06/01/22 1821  BP: 109/80 (!) 136/119 (!) 142/97   Pulse: 78 84 (!) 110   Resp: 19 12 16    Temp:    98.7 F (37.1 C)  TempSrc:    Oral  SpO2: 98% 100% 100%   Weight:      Height:      PainSc:          The recent clinical data is shown below. Vitals:   06/01/22 1700 06/01/22 1730 06/01/22 1800 06/01/22 1821  BP: 109/80 (!) 136/119 (!) 142/97   Pulse: 78 84 (!) 110   Resp: 19 12 16    Temp:    98.7 F (37.1 C)  TempSrc:    Oral  SpO2: 98% 100% 100%   Weight:      Height:          -Most likely source being  Urinary   Patient meeting criteria for Severe sepsis with    evidence of end organ damage/organ dysfunction such as    elevated  lactic acid >2     Component Value Date/Time   LATICACIDVEN 3.0 (HH) 06/01/2022 1557       - Obtain serial lactic acid and procalcitonin level.  - Initiated IV antibiotics in ER: Antibiotics Given (last 72 hours)     Date/Time Action Medication Dose Rate   06/01/22 1650 New Bag/Given   cefTRIAXone (ROCEPHIN) 2 g in sodium chloride 0.9 % 100 mL IVPB 2 g 200 mL/hr       Will continue  on :cfepime   - await results of blood and urine culture  - Rehydrate aggressively  Intravenous fluids were administered,    6:36 PM   Essential hypertension Allow permissive hypertension  HLD (hyperlipidemia) Chronic stable continue Crestor  History of kidney stones Status post stenting Urology is aware contiune IV antibaiotcs  Cefepime per pharmacy Urology is aware  AKI (acute kidney injury) ([place]) Obtain urine electrolytes urology recommends postvoid residual measurements in case patient has any count or  retention.   Hypokalemia - will replace and repeat in AM,  check magnesium level and replace as needed   Right ankle pain Right ankle with small painful nodule will order [place]  DM2 (diabetes mellitus, type 2) (H[facility])  - Order Sensitive SSI     -  check TSH and HgA1C      Constipation Order bowel regimen and KUB  UTI (urinary tract infection)  - treat with cefepime       await results of urine culture and adjust antibiotic coverage as needed   SBO (small bowel obstruction) ([place]) KUB concerning for small bowel obstruction will obtain CT to confirm.  Changed to n.p.o. for now.  If evidence of SBO will need general surgery consult in a.m. and possibly NG tube placement if there is any evidence of nausea at this point patient denies any nausea or significant abdominal pain although there is some abdominal distention and patient reports significant constipation   Other plan as per orders.  DVT prophylaxis:  SCD       Code Status:    Code Status: Prior FULL CODE   as per  patient    I had personally discussed CODE STATUS with patient     Family Communication:   Family not at  Bedside    Disposition Plan:   g barriers for discharge:                                                         Afebrile, white count improving able to transition to PO antibiotics                                                         Will need consultants to evaluate patient prior to discharge                      Consults called: Urology is aware    Admission status:  ED Disposition     ED Disposition  Admit   Condition  --   [place]: [place] [[id number]]  Level of Care: Progressive [102]  Admit to Progressive based on following criteria: MULTISYSTEM THREATS such as stable sepsis, metabolic/electrolyte imbalance with or without encephalopathy that is responding to early treatment.  May admit patient to [facility] or [facility] if equivalent level of care is available:: No  Covid Evaluation: Asymptomatic - no recent exposure (last 10 days) testing not required  Diagnosis: Sepsis [facility]) [[id number]]  Admitting Physician: Toy Baker [[id number]]  Attending Physician: Toy Baker [[id number]]  Estimated length of stay: past midnight tomorrow  Certification:: I certify this patient will need inpatient services for at least 2 midnights             inpatient     I Expect 2 midnight stay secondary to severity of patient's current illness need for inpatient interventions justified by the following:  hemodynamic instability despite optimal treatment ( hypotension * )   Severe lab/radiological/exam abnormalities including:   Sepsis and extensive comorbidities including:      malignancy,    That are currently affecting medical management.  I expect  patient to be hospitalized for 2 midnights requiring inpatient medical care.  Patient is at high risk for adverse outcome (such as loss of life or disability) if not  treated.  Indication for inpatient stay as follows:    Hemodynamic instability despite maximal medical therapy,    Need for operative/procedural  intervention     Need for IV antibiotics, IV fluids,     Level of care     progressive tele indefinitely please discontinue once patient no longer qualifies COVID-19 Labs    Priscella Donna 06/01/2022, 8:27 PM     [facility]     after 2 AM please page floor coverage PA If 7AM-7PM, please contact the day team taking care of the patient using [email or web address]   Patient was evaluated in the context of the global COVID-19 pandemic, which necessitated consideration that the patient might be at risk for infection with the SARS-CoV-2 virus that causes COVID-19. Institutional protocols and algorithms that pertain to the evaluation of patients at risk for COVID-19 are in a state of rapid change based on information released by regulatory bodies including the CDC and federal and state organizations. These policies and algorithms were followed during the patient's care.

## 2022-06-01 NOTE — ED Triage Notes (Signed)
Pt reports he was at the surgery center for pre-op testing and they found that his BP was low and HR was high. Pt BP is 89/67 in triage. Pt appears SHOB as well.

## 2022-06-01 NOTE — Assessment & Plan Note (Signed)
-   will replace and repeat in AM,  check magnesium level and replace as needed ° °

## 2022-06-01 NOTE — ED Provider Notes (Signed)
[facility] DEPT Provider Note   CSN: [id number] Arrival date & time: 06/01/22  1357     History  Chief Complaint  Patient presents with   Hypotension    Rickey Cruz is a 61 y.o. male.  Patient has a history of a kidney stone and a stent.  He also has a history of hypertension and it was being checked today and his blood pressure was low so he was sent to the emergency department.  He also complains of some dysuria   Weakness Severity:  Moderate Onset quality:  Sudden Timing:  Constant Progression:  Worsening Chronicity:  Recurrent Context: not alcohol use   Relieved by:  Nothing Worsened by:  Nothing Ineffective treatments:  None tried Associated symptoms: no abdominal pain, no chest pain, no cough, no diarrhea, no frequency, no headaches and no seizures   Risk factors: no anemia       Home Medications Prior to Admission medications   Medication Sig Start Date End Date Taking? Authorizing Provider  albuterol (VENTOLIN HFA) 108 (90 Base) MCG/ACT inhaler INHALE 2 PUFFS INTO THE LUNGS EVERY 4 HOURS AS NEEDED FOR WHEEZING OR SHORTNESS OF BREATH(COUGHING FITS) Patient taking differently: Inhale 2 puffs into the lungs every 4 (four) hours as needed for wheezing or shortness of breath. 12/21/21  Yes Kozlow, Donnamarie Poag, MD  amLODipine (NORVASC) 5 MG tablet TAKE 1 TABLET(5 MG) BY MOUTH DAILY Patient taking differently: Take 5 mg by mouth daily. 12/21/21  Yes Sagardia, Ines Bloomer, MD  Cephalexin 500 MG tablet Take 500 mg by mouth 2 (two) times daily. 05/30/22  Yes [provider]  finasteride (PROSCAR) 5 MG tablet Take 1 tablet (5 mg total) by mouth daily. 05/25/22  Yes Lorella Nimrod, MD  lisinopril (ZESTRIL) 40 MG tablet Take 1 tablet (40 mg total) by mouth daily. 07/07/21  Yes Sagardia, Ines Bloomer, MD  ondansetron (ZOFRAN-ODT) 4 MG disintegrating tablet '4mg'$  ODT q4 hours prn nausea/vomit Patient taking differently: Take 4 mg by mouth every 4 (four)  hours as needed for nausea or vomiting. 05/18/22  Yes Milton Ferguson, MD  oxybutynin (DITROPAN) 5 MG tablet Take 1 tablet (5 mg total) by mouth every 8 (eight) hours as needed for bladder spasms. 05/24/22  Yes Jacalyn Lefevre D, MD  oxyCODONE-acetaminophen (PERCOCET/ROXICET) 5-325 MG tablet Take 1 tablet by mouth every 6 (six) hours as needed for severe pain. 05/18/22  Yes [provider]  rosuvastatin (CRESTOR) 10 MG tablet TAKE 1 TABLET(10 MG) BY MOUTH DAILY Patient taking differently: Take 10 mg by mouth daily. 02/14/22  Yes Sagardia, Ines Bloomer, MD  Budeson-Glycopyrrol-Formoterol (BREZTRI AEROSPHERE) 160-9-4.8 MCG/ACT AERO Inhale two puffs with spacer twice daily to prevent cough or wheeze.  Rinse, gargle, and spit after use. 04/27/22   Kozlow, Donnamarie Poag, MD  clobetasol ointment (TEMOVATE) 7.79 % Apply 1 application. topically 2 (two) times daily as needed (psoriasis). 05/30/22   [provider]  ketorolac (TORADOL) 10 MG tablet Take 10 mg by mouth every 8 (eight) hours as needed for moderate pain or severe pain. 05/31/22   [provider]  tamsulosin (FLOMAX) 0.4 MG CAPS capsule Take 1 capsule (0.4 mg total) by mouth at bedtime. 05/24/22   Robley Fries, MD      Allergies    Patient has no known allergies.    Review of Systems   Review of Systems  Constitutional:  Negative for appetite change and fatigue.  HENT:  Negative for congestion, ear discharge and sinus pressure.  Eyes:  Negative for discharge.  Respiratory:  Negative for cough.   Cardiovascular:  Negative for chest pain.  Gastrointestinal:  Negative for abdominal pain and diarrhea.  Genitourinary:  Negative for frequency and hematuria.  Musculoskeletal:  Negative for back pain.  Skin:  Negative for rash.  Neurological:  Positive for weakness. Negative for seizures and headaches.  Psychiatric/Behavioral:  Negative for hallucinations.    Physical Exam Updated Vital Signs BP (!) 142/97   Pulse (!) 110    Temp 98.7 F (37.1 C) (Oral)   Resp 16   Ht '5\' 6"'$  (1.676 m)   Wt 82.6 kg   SpO2 100%   BMI 29.38 kg/m  Physical Exam Vitals and nursing note reviewed.  Constitutional:      Appearance: He is well-developed.  HENT:     Head: Normocephalic.     Nose: Nose normal.  Eyes:     General: No scleral icterus.    Conjunctiva/sclera: Conjunctivae normal.  Neck:     Thyroid: No thyromegaly.  Cardiovascular:     Rate and Rhythm: Normal rate and regular rhythm.     Heart sounds: No murmur heard.   No friction rub. No gallop.  Pulmonary:     Breath sounds: No stridor. No wheezing or rales.  Chest:     Chest wall: No tenderness.  Abdominal:     General: There is no distension.     Tenderness: There is no abdominal tenderness. There is no rebound.  Musculoskeletal:        General: Normal range of motion.     Cervical back: Neck supple.  Lymphadenopathy:     Cervical: No cervical adenopathy.  Skin:    Findings: No erythema or rash.  Neurological:     Mental Status: He is alert and oriented to person, place, and time.     Motor: No abnormal muscle tone.     Coordination: Coordination normal.  Psychiatric:        Behavior: Behavior normal.    ED Results / Procedures / Treatments   Labs (all labs ordered are listed, but only abnormal results are displayed) Labs Reviewed  URINALYSIS, ROUTINE W REFLEX MICROSCOPIC - Abnormal; Notable for the following components:      Result Value   Color, Urine AMBER (*)    APPearance CLOUDY (*)    Hgb urine dipstick LARGE (*)    Protein, ur 100 (*)    Leukocytes,Ua MODERATE (*)    RBC / HPF >50 (*)    WBC, UA >50 (*)    Bacteria, UA RARE (*)    All other components within normal limits  CBC - Abnormal; Notable for the following components:   WBC 15.0 (*)    HCT 38.7 (*)    All other components within normal limits  BASIC METABOLIC PANEL - Abnormal; Notable for the following components:   Sodium 133 (*)    Potassium 3.2 (*)    Chloride 97  (*)    Glucose, Bld 108 (*)    BUN 26 (*)    Creatinine, Ser 1.84 (*)    Calcium 8.7 (*)    GFR, Estimated 41 (*)    All other components within normal limits  HEPATIC FUNCTION PANEL - Abnormal; Notable for the following components:   AST 53 (*)    ALT 54 (*)    All other components within normal limits  LACTIC ACID, PLASMA - Abnormal; Notable for the following components:   Lactic Acid, Venous 3.0 (*)  All other components within normal limits  URINE CULTURE  LACTIC ACID, PLASMA    EKG None  Radiology CT Renal Stone Study  Result Date: 05/30/2022 CLINICAL DATA:  Flank pain, kidney stone suspected Patient reports catheter removed this morning, has not yet herniated since that time. EXAM: CT ABDOMEN AND PELVIS WITHOUT CONTRAST TECHNIQUE: Multidetector CT imaging of the abdomen and pelvis was performed following the standard protocol without IV contrast. RADIATION DOSE REDUCTION: This exam was performed according to the departmental dose-optimization program which includes automated exposure control, adjustment of the mA and/or kV according to patient size and/or use of iterative reconstruction technique. COMPARISON:  CT 2 days ago 05/28/2022 FINDINGS: Lower chest: Clear lung bases.  No acute findings. Hepatobiliary: Borderline hepatic steatosis. No focal liver abnormality. Clips in the gallbladder fossa postcholecystectomy. No biliary dilatation. Pancreas: No ductal dilatation or inflammation. Spleen: Normal in size without focal abnormality. Adrenals/Urinary Tract: No adrenal nodule. Left-sided ureteral stent remains in place. Proximal pigtail in the renal pelvis, distal pigtail in the bladder. There is no hydronephrosis. Previous small stone in the left distal ureter adjacent to the stent series 2, image 67, without significant interval change. Nonobstructing stone in the lower pole of the left kidney. Small low-density lesion in the left upper pole is likely a cyst, but incompletely  characterized due to size and lack of the IV contrast. No right hydronephrosis, renal calculi or perinephric edema. Urinary bladder is essentially empty, and not well assessed. Foley catheter has been removed. Stomach/Bowel: Stomach is within normal limits. Appendectomy. No evidence of bowel wall thickening, distention, or inflammatory changes. Vascular/Lymphatic: Mild aortic atherosclerosis. No aortic aneurysm. No portal venous or mesenteric gas. No abdominopelvic adenopathy. Reproductive: Markedly enlarged prostate gland causing mass effect on the bladder base. Other: No free air, free fluid, or intra-abdominal fluid collection. Musculoskeletal: No free air, free fluid, or intra-abdominal fluid collection. IMPRESSION: 1. Left-sided ureteral stent remains in place. Previous small stone in the left distal ureter adjacent to the stent is unchanged. No hydronephrosis. 2. Nonobstructing stone in the lower pole of the left kidney. 3. Urinary bladder is essentially empty, the Foley catheter has been removed. 4. Markedly enlarged prostate gland causing mass effect on the bladder base. Aortic Atherosclerosis (ICD10-I70.0). Electronically Signed   By: Keith Rake M.D.   On: 05/30/2022 20:51    Procedures Procedures    Medications Ordered in ED Medications  sodium chloride 0.9 % bolus 1,000 mL (0 mLs Intravenous Stopped 06/01/22 1654)  cefTRIAXone (ROCEPHIN) 2 g in sodium chloride 0.9 % 100 mL IVPB (0 g Intravenous Stopped 06/01/22 1751)  sodium chloride 0.9 % bolus 1,000 mL (0 mLs Intravenous Stopped 06/01/22 1751)  HYDROmorphone (DILAUDID) injection 1 mg (1 mg Intravenous Given 06/01/22 1759)    ED Course/ Medical Decision Making/ A&P  CRITICAL CARE Performed by: Milton Ferguson Total critical care time: 40 minutes Critical care time was exclusive of separately billable procedures and treating other patients. Critical care was necessary to treat or prevent imminent or life-threatening deterioration. Critical  care was time spent personally by me on the following activities: development of treatment plan with patient and/or surrogate as well as nursing, discussions with consultants, evaluation of patient's response to treatment, examination of patient, obtaining history from patient or surrogate, ordering and performing treatments and interventions, ordering and review of laboratory studies, ordering and review of radiographic studies, pulse oximetry and re-evaluation of patient's condition.   I spoke with Dr. Junious Silk urology and he recommended an 18-gauge Foley  catheter if the patient had a significant postvoid residual.  Patient postvoid residual was 100 cc.                         Medical Decision Making Amount and/or Complexity of Data Reviewed Labs: ordered.  Risk Prescription drug management. Decision regarding hospitalization.   Patient with possible sepsis from urinary tract.  He will be admitted to medicine and urology will be following.        Final Clinical Impression(s) / ED Diagnoses Final diagnoses:  Acute sepsis [facility])    Rx / DC Orders ED Discharge Orders     None         Milton Ferguson, MD 06/01/22 1842

## 2022-06-01 NOTE — Progress Notes (Signed)
Pharmacy Antibiotic Note  Rickey Cruz is a 61 y.o. male admitted on 06/01/2022 with UTI.  Pharmacy has been consulted for Cefepime dosing.  Plan: Cefepime 2g IV q12h Follow up renal function & cultures  Height: '5\' 6"'$  (167.6 cm) Weight: 82.6 kg (182 lb) IBW/kg (Calculated) : 63.8  Temp (24hrs), Avg:98.9 F (37.2 C), Min:98.7 F (37.1 C), Max:99.1 F (37.3 C)  Recent Labs  Lab 05/28/22 1400 05/30/22 2011 06/01/22 1557 06/01/22 1810  WBC 6.4 12.6* 15.0*  --   CREATININE 1.03 1.15 1.84*  --   LATICACIDVEN  --   --  3.0* 1.0    Estimated Creatinine Clearance: 43.1 mL/min (A) (by C-G formula based on SCr of 1.84 mg/dL (H)).    No Known Allergies  Antimicrobials this admission: 6/7 CTX x 1 6/7 Cefepime >>  Dose adjustments this admission:  Microbiology results: 6/7 BCx: 6/7 UCx:  Thank you for allowing pharmacy to be a part of this patient's care.  Peggyann Juba, PharmD, BCPS Pharmacy: [phone] 06/01/2022 6:59 PM

## 2022-06-02 ENCOUNTER — Inpatient Hospital Stay (HOSPITAL_COMMUNITY): Payer: BC Managed Care – PPO

## 2022-06-02 ENCOUNTER — Encounter (HOSPITAL_COMMUNITY): Payer: Self-pay | Admitting: Internal Medicine

## 2022-06-02 ENCOUNTER — Other Ambulatory Visit: Payer: Self-pay

## 2022-06-02 DIAGNOSIS — N179 Acute kidney failure, unspecified: Secondary | ICD-10-CM

## 2022-06-02 DIAGNOSIS — Z87442 Personal history of urinary calculi: Secondary | ICD-10-CM

## 2022-06-02 DIAGNOSIS — I10 Essential (primary) hypertension: Secondary | ICD-10-CM | POA: Diagnosis not present

## 2022-06-02 DIAGNOSIS — A419 Sepsis, unspecified organism: Secondary | ICD-10-CM

## 2022-06-02 DIAGNOSIS — E1122 Type 2 diabetes mellitus with diabetic chronic kidney disease: Secondary | ICD-10-CM

## 2022-06-02 DIAGNOSIS — N182 Chronic kidney disease, stage 2 (mild): Secondary | ICD-10-CM

## 2022-06-02 DIAGNOSIS — M25571 Pain in right ankle and joints of right foot: Secondary | ICD-10-CM

## 2022-06-02 LAB — COMPREHENSIVE METABOLIC PANEL
ALT: 169 U/L — ABNORMAL HIGH (ref 0–44)
AST: 231 U/L — ABNORMAL HIGH (ref 15–41)
Albumin: 2.7 g/dL — ABNORMAL LOW (ref 3.5–5.0)
Alkaline Phosphatase: 58 U/L (ref 38–126)
Anion gap: 10 (ref 5–15)
BUN: 16 mg/dL (ref 6–20)
CO2: 22 mmol/L (ref 22–32)
Calcium: 8 mg/dL — ABNORMAL LOW (ref 8.9–10.3)
Chloride: 101 mmol/L (ref 98–111)
Creatinine, Ser: 0.89 mg/dL (ref 0.61–1.24)
GFR, Estimated: >60 mL/min (ref >60)
Glucose, Bld: 110 mg/dL — ABNORMAL HIGH (ref 70–99)
Potassium: 3.5 mmol/L (ref 3.5–5.1)
Sodium: 133 mmol/L — ABNORMAL LOW (ref 135–145)
Total Bilirubin: 0.8 mg/dL (ref 0.3–1.2)
Total Protein: 5.7 g/dL — ABNORMAL LOW (ref 6.5–8.1)

## 2022-06-02 LAB — CBC WITH DIFFERENTIAL/PLATELET
Abs Immature Granulocytes: 0.08 10*3/uL — ABNORMAL HIGH (ref 0.00–0.07)
Basophils Absolute: 0 10*3/uL (ref 0.0–0.1)
Basophils Relative: 0 %
Eosinophils Absolute: 0 10*3/uL (ref 0.0–0.5)
Eosinophils Relative: 0 %
HCT: 31.8 % — ABNORMAL LOW (ref 39.0–52.0)
Hemoglobin: 10.9 g/dL — ABNORMAL LOW (ref 13.0–17.0)
Immature Granulocytes: 1 %
Lymphocytes Relative: 6 %
Lymphs Abs: 0.7 10*3/uL (ref 0.7–4.0)
MCH: 30.4 pg (ref 26.0–34.0)
MCHC: 34.3 g/dL (ref 30.0–36.0)
MCV: 88.6 fL (ref 80.0–100.0)
Monocytes Absolute: 1.1 10*3/uL — ABNORMAL HIGH (ref 0.1–1.0)
Monocytes Relative: 11 %
Neutro Abs: 8.5 10*3/uL — ABNORMAL HIGH (ref 1.7–7.7)
Neutrophils Relative %: 82 %
Platelets: 206 10*3/uL (ref 150–400)
RBC: 3.59 MIL/uL — ABNORMAL LOW (ref 4.22–5.81)
RDW: 12.2 % (ref 11.5–15.5)
WBC: 10.4 10*3/uL (ref 4.0–10.5)
nRBC: 0 % (ref 0.0–0.2)

## 2022-06-02 LAB — CK: Total CK: 40 U/L — ABNORMAL LOW (ref 49–397)

## 2022-06-02 LAB — GLUCOSE, CAPILLARY: Glucose-Capillary: 208 mg/dL — ABNORMAL HIGH (ref 70–99)

## 2022-06-02 LAB — OSMOLALITY, URINE: Osmolality, Ur: 425 mOsm/kg (ref 300–900)

## 2022-06-02 LAB — CBG MONITORING, ED
Glucose-Capillary: 105 mg/dL — ABNORMAL HIGH (ref 70–99)
Glucose-Capillary: 117 mg/dL — ABNORMAL HIGH (ref 70–99)
Glucose-Capillary: 119 mg/dL — ABNORMAL HIGH (ref 70–99)
Glucose-Capillary: 124 mg/dL — ABNORMAL HIGH (ref 70–99)
Glucose-Capillary: 127 mg/dL — ABNORMAL HIGH (ref 70–99)
Glucose-Capillary: 139 mg/dL — ABNORMAL HIGH (ref 70–99)

## 2022-06-02 LAB — PROCALCITONIN: Procalcitonin: 0.19 ng/mL

## 2022-06-02 LAB — LACTIC ACID, PLASMA: Lactic Acid, Venous: 1 mmol/L (ref 0.5–1.9)

## 2022-06-02 LAB — OSMOLALITY: Osmolality: 276 mOsm/kg (ref 275–295)

## 2022-06-02 LAB — PREALBUMIN: Prealbumin: 7.6 mg/dL — ABNORMAL LOW (ref 18–38)

## 2022-06-02 LAB — PHOSPHORUS: Phosphorus: 2.4 mg/dL — ABNORMAL LOW (ref 2.5–4.6)

## 2022-06-02 LAB — TSH: TSH: 2.988 u[IU]/mL (ref 0.350–4.500)

## 2022-06-02 LAB — MAGNESIUM: Magnesium: 1.9 mg/dL (ref 1.7–2.4)

## 2022-06-02 IMAGING — CT CT ABD-PELV W/O CM
2 of 4 series · 15 of 46 positions shown, 17 images · non-contrast
Comparison: CT abdomen pelvis dated [DATE].

CLINICAL DATA: Abdominal pain.  Concern for bowel obstruction.



[Series 2: axial st · axial · 0.73mm/px · z∈[+1049,+1479]mm · 12 of 96 slices shown, 14 images]
[im 5/96  soft-tissue]
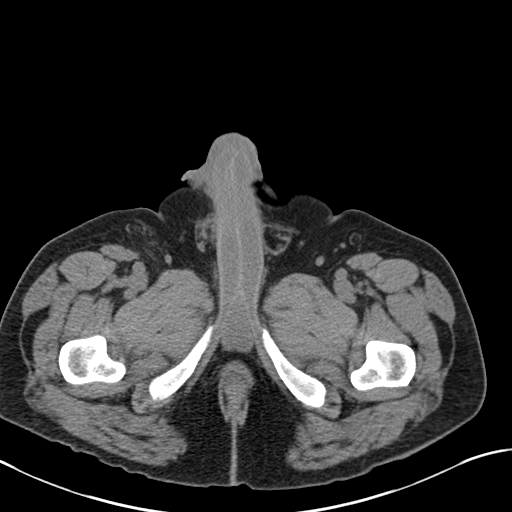
[im 5/96  bone]
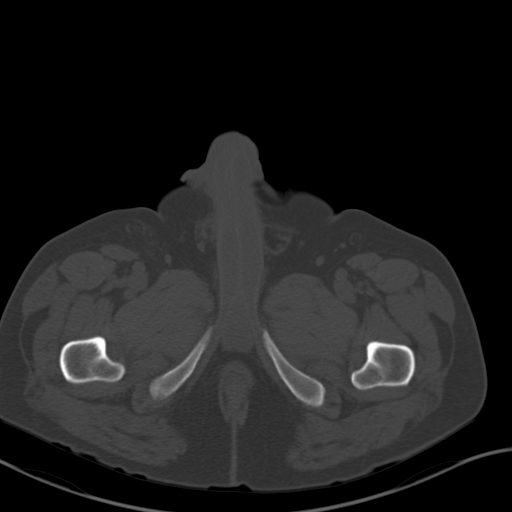
[im 14/96  soft-tissue]
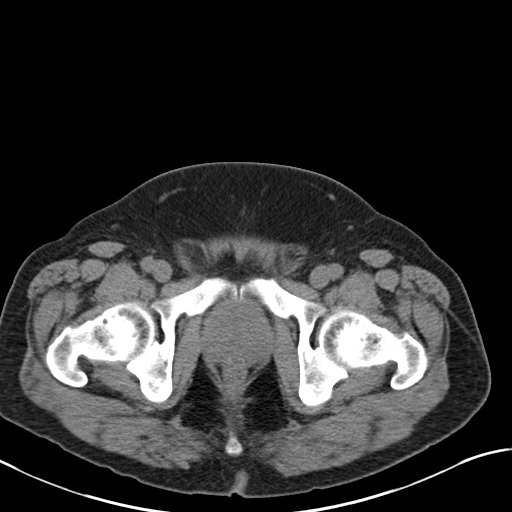
[im 23/96  soft-tissue]
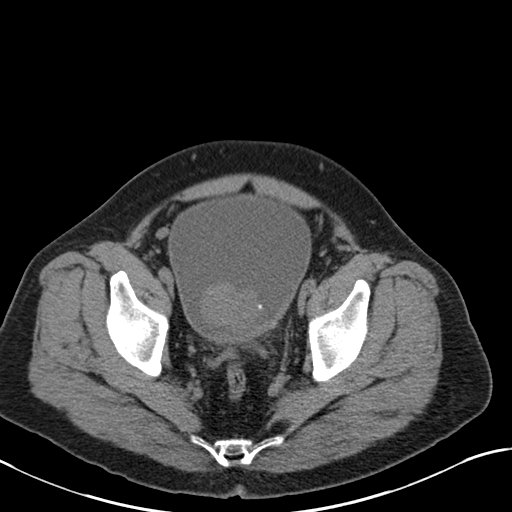
[im 28/96  soft-tissue]
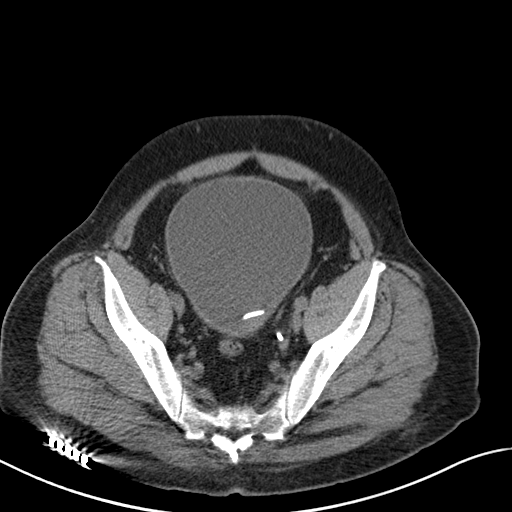
[im 37/96  soft-tissue]
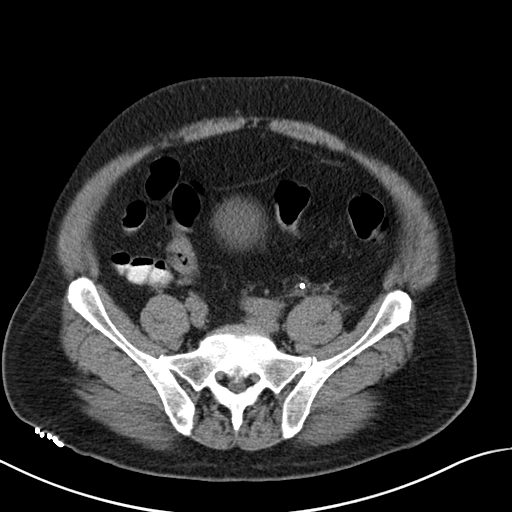
[im 46/96  soft-tissue]
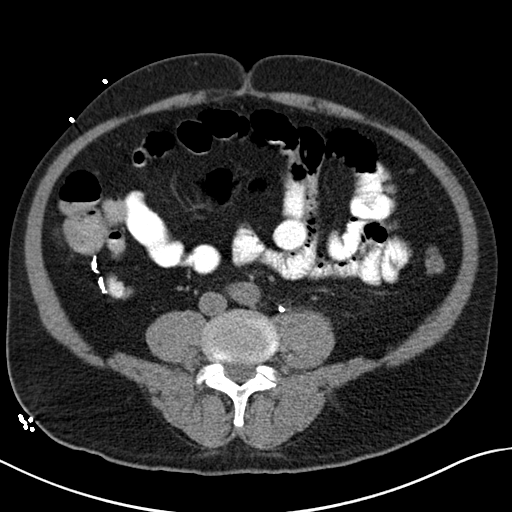
[im 50/96  soft-tissue]
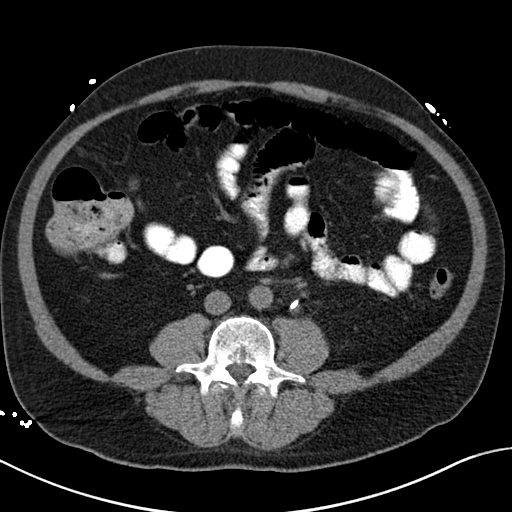
[im 59/96  soft-tissue]
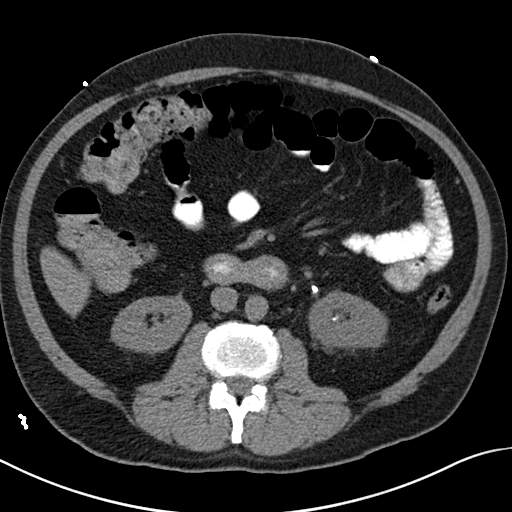
[im 68/96  soft-tissue]
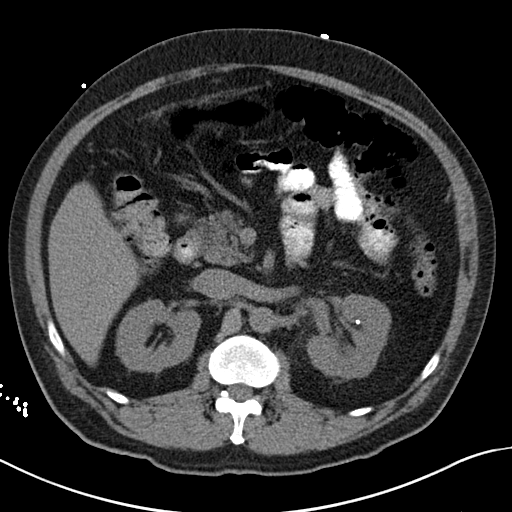
[im 68/96  bone]
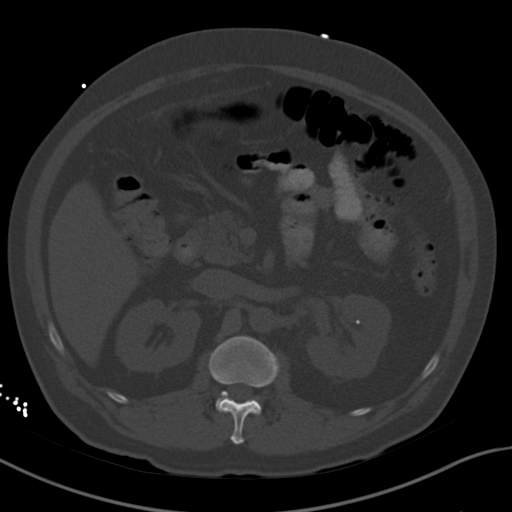
[im 73/96  soft-tissue]
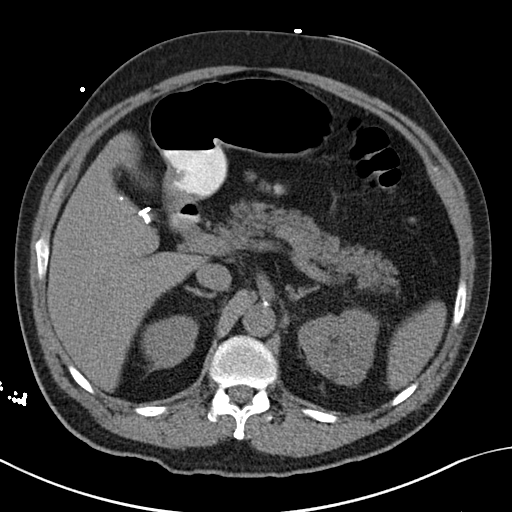
[im 82/96  soft-tissue]
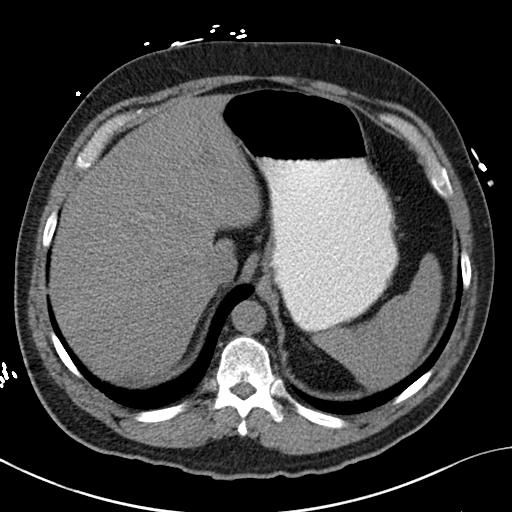
[im 91/96  soft-tissue]
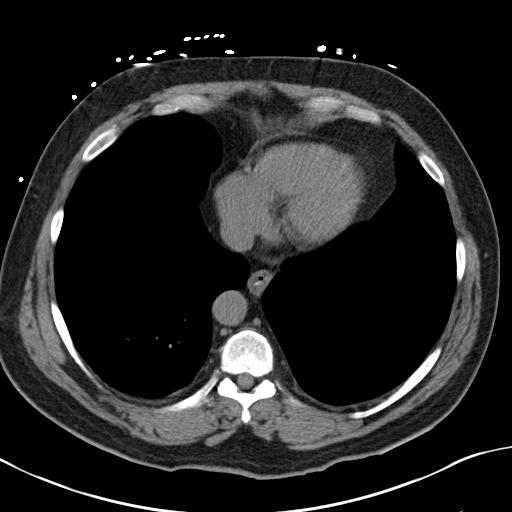

[Series 4: coronal st · coronal · 0.73mm/px · 3 of 172 slices shown]
[im 58/172  soft-tissue]
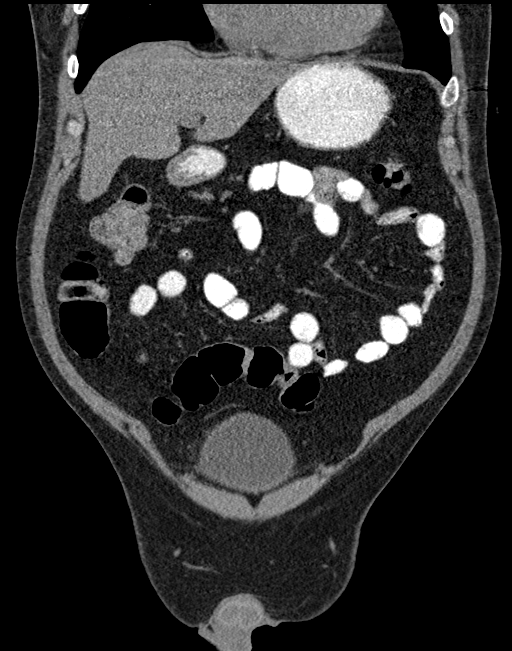
[im 77/172  soft-tissue]
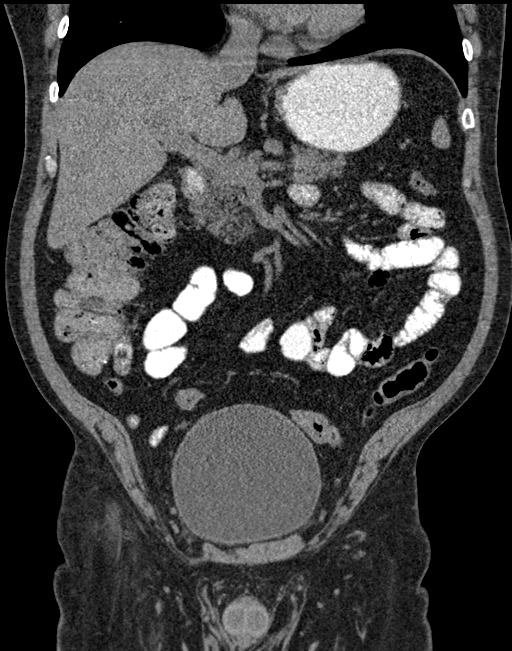
[im 96/172  soft-tissue]
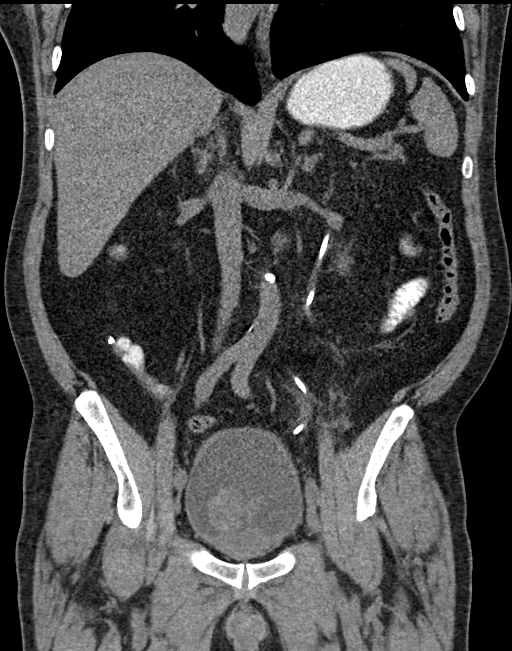

[15 of 46 positions shown; findings below may reference images not displayed]

FINDINGS: Evaluation of this exam is limited in the absence of intravenous
contrast.

Lower chest: The visualized lung bases are clear. There is coronary
vascular calcification.

No intra-abdominal free air or free fluid.

Hepatobiliary: There is unremarkable. No intrahepatic biliary ductal
dilatation. Cholecystectomy. No retained calcified stone noted in
the central CBD.

Pancreas: Unremarkable. No pancreatic ductal dilatation or
surrounding inflammatory changes.

Spleen: Normal in size without focal abnormality.

Adrenals/Urinary Tract: The adrenal glands unremarkable. Mild left
hydronephrosis, new or increased since the prior CT. Similar
positioning of the left ureteral stent. Several small nonobstructing
left renal inferior pole calculi. A 3 mm stone in the distal left
ureter along the stent in similar location as the prior CT. There is
no hydronephrosis or nephrolithiasis on right. The right ureter and
urinary bladder appear unremarkable.

Stomach/Bowel: Oral contrast opacifies the stomach and multiple
loops of small bowel. There is no bowel obstruction or active
inflammation. Appendectomy.

Vascular/Lymphatic: Mild aortoiliac atherosclerotic disease. The IVC
is grossly unremarkable. No portal venous gas. There is no
adenopathy.

Reproductive: Enlarged prostate gland measuring 5.3 cm in transverse
axial diameter. There is lobulated median lobe hypertrophy indenting
and protruding through the base of the bladder.

Other: None

Musculoskeletal: No acute or significant osseous findings.
IMPRESSION: 1. No bowel obstruction.
2. Mild left hydronephrosis, new or increased since the prior CT.
Similar positioning of the left ureteral stent. A 3 mm stone in the
distal left ureter along the stent in similar location as the prior
CT. Several small nonobstructing left renal inferior pole calculi.
3. Enlarged prostate gland with lobulated median lobe hypertrophy
indenting and protruding through the base of the bladder.
4. Aortic Atherosclerosis ([PE]-[PE]).

## 2022-06-02 MED ORDER — ALBUTEROL SULFATE (2.5 MG/3ML) 0.083% IN NEBU: 2.5000 mg | INHALATION_SOLUTION | RESPIRATORY_TRACT | Status: DC | PRN

## 2022-06-02 MED ORDER — TAMSULOSIN HCL 0.4 MG PO CAPS: 0.4000 mg | ORAL_CAPSULE | Freq: Every day | ORAL | Status: DC

## 2022-06-02 MED ORDER — HYDRALAZINE HCL 25 MG PO TABS: 25.0000 mg | ORAL_TABLET | Freq: Four times a day (QID) | ORAL | Status: DC | PRN

## 2022-06-02 MED ORDER — AMLODIPINE BESYLATE 5 MG PO TABS
5.0000 mg | ORAL_TABLET | Freq: Every day | ORAL | Status: DC
  Administered 2022-06-02 – 2022-06-06 (×5): 5 mg via ORAL
  Filled 2022-06-02 (×5): qty 1

## 2022-06-02 MED ORDER — CEFEPIME HCL 2 G IV SOLR
2.0000 g | Freq: Three times a day (TID) | INTRAVENOUS | Status: DC
Start: 2022-06-02 — End: 2022-06-03
  Administered 2022-06-02 – 2022-06-03 (×2): 2 g via INTRAVENOUS
  Filled 2022-06-02 (×2): qty 12.5

## 2022-06-02 MED ORDER — ACETAMINOPHEN 325 MG PO TABS
650.0000 mg | ORAL_TABLET | Freq: Four times a day (QID) | ORAL | Status: DC | PRN
  Administered 2022-06-03 – 2022-06-06 (×7): 650 mg via ORAL
  Filled 2022-06-02 (×7): qty 2

## 2022-06-02 MED ORDER — CHLORHEXIDINE GLUCONATE CLOTH 2 % EX PADS
6.0000 | MEDICATED_PAD | Freq: Every day | CUTANEOUS | Status: DC
  Administered 2022-06-03 – 2022-06-05 (×3): 6 via TOPICAL

## 2022-06-02 MED ORDER — ALBUTEROL SULFATE HFA 108 (90 BASE) MCG/ACT IN AERS: 2.0000 | INHALATION_SPRAY | RESPIRATORY_TRACT | Status: DC | PRN

## 2022-06-02 MED ORDER — SODIUM CHLORIDE 0.9 % IV SOLN
75.0000 mL/h | INTRAVENOUS | Status: AC
  Administered 2022-06-02: 75 mL/h via INTRAVENOUS

## 2022-06-02 MED ORDER — ROSUVASTATIN CALCIUM 10 MG PO TABS
10.0000 mg | ORAL_TABLET | Freq: Every day | ORAL | Status: DC
  Administered 2022-06-02 – 2022-06-05 (×4): 10 mg via ORAL
  Filled 2022-06-02 (×4): qty 1

## 2022-06-02 MED ORDER — HYDROCODONE-ACETAMINOPHEN 5-325 MG PO TABS
1.0000 | ORAL_TABLET | ORAL | Status: DC | PRN
  Administered 2022-06-02 (×2): 2 via ORAL
  Administered 2022-06-02: 1 via ORAL
  Administered 2022-06-03 – 2022-06-04 (×2): 2 via ORAL
  Filled 2022-06-02: qty 1
  Filled 2022-06-02 (×4): qty 2

## 2022-06-02 MED ORDER — ACETAMINOPHEN 650 MG RE SUPP: 650.0000 mg | Freq: Four times a day (QID) | RECTAL | Status: DC | PRN

## 2022-06-02 NOTE — ED Notes (Signed)
Bladder scanner pt, pt is retaining 683 mL of urine in bladder.

## 2022-06-02 NOTE — Progress Notes (Signed)
RLE venous duplex has been completed.  Results can be found under chart review under CV PROC. 06/02/2022 12:48 PM Bryam Taborda RVT, RDMS

## 2022-06-02 NOTE — ED Notes (Signed)
Documenting noted events that occurred during [facility] downtime.

## 2022-06-02 NOTE — Progress Notes (Addendum)
[facility]  PROGRESS NOTE  Rickey Cruz XBL:[id number] DOB: 10/03/1961 DOA: 06/01/2022 PCP: Horald Pollen, MD   Brief HPI:   61 year old male with history of hypertension, diabetes mellitus type 2, kidney stones presented with hypotension.  While undergoing preop clearance at surgery center he was found to have blood pressure of 89/67, appeared short of breath and ill-appearing.  Patient has been followed by urology.  He has an obstructing left ureteral stone measuring 5 mm noted on 09 May 2022, s/p ureteral stent placement.  He has been experiencing urinary retention, had a catheter placed and was removed on 5th June. In the ED patient responded to IV fluid challenge, no issue noted to be significant for UTI.  He was started on Rocephin and urology was consulted. CT abdomen/pelvis showed left-sided ureteral stent remain in place.  Previous small stone in the distal ureter adjacent to stent is unchanged.  No hydronephrosis.  Nonobstructing stone in the lower pole of left kidney.  Urinary bladder essentially empty.  Foley catheter has been removed.  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    Subjective   Patient seen and examined, complains of penile pain while urinating.  He does have enlarged prostate.  He was offered Foley catheter to be placed by urology however he refused.   Assessment/Plan:    Sepsis due to UTI -Patient started on cefepime for possible sepsis due to UTI -He presented with sinus tachycardia, hypotension, responded to IV fluids -Sepsis resolved he has resolved -Follow urine culture results  Hypertension -Blood pressure is now elevated -Patient started amlodipine 5 mg daily  Diabetes mellitus type 2 -Continue sliding scale insulin with NovoLog -CBG well controlled  Dysuria/left ureteral stent in place -Patient still has significant pain on urination -Called and discussed with urologist on-call Dr. McDermiad -He  recommends getting another postvoid residual and then call him with the results  Acute kidney injury -Presents with creatinine of 1.84; baseline creatinine 1.15 -Resolved with IV fluids -Today creatinine 0.89  Transaminitis -Unclear etiology -CT abdomen pelvis obtained yesterday was unremarkable -Likely shock liver -We will repeat LFTs in a.m. 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  Small bowel obstruction -There was a question of SBO on KUB -CT abdomen/pelvis obtained shows no bowel obstruction -Started on regular diet    Medications     amLODipine  5 mg Oral Daily   docusate sodium  100 mg Oral BID   insulin aspart  0-9 Units Subcutaneous Q4H   polyethylene glycol  17 g Oral Daily   rosuvastatin  10 mg Oral Daily   tamsulosin  0.4 mg Oral QPC supper     Data Reviewed:   CBG:  Recent Labs  Lab 06/01/22 2336 06/02/22 0017 06/02/22 0352 06/02/22 0804 06/02/22 1204  GLUCAP 135* 139* 105* 124* 117*    SpO2: 100 %    Vitals:   06/02/22 1100 06/02/22 1203 06/02/22 1230 06/02/22 1530  BP:  (!) 126/93 (!) 138/96 (!) 146/101  Pulse: 75 68 86 86  Resp: 20 14 (!) 24 13  Temp:      TempSrc:      SpO2: 98% 98% 98% 100%  Weight:      Height:          Data Reviewed:  Basic Metabolic Panel: Recent Labs  Lab 05/28/22 1400 05/30/22 2011 06/01/22 1557 06/02/22 0352  NA 137 134* 133* 133*  K 3.3* 3.8 3.2* 3.5  CL 105 105 97* 101  CO2 23 20* 23 22  GLUCOSE 105* 165* 108* 110*  BUN 12 12 26* 16  CREATININE 1.03 1.15 1.84* 0.89  CALCIUM 8.9 8.8* 8.7* 8.0*  MG  --   --   --  1.9  PHOS  --   --   --  2.4*    CBC: Recent Labs  Lab 05/28/22 1400 05/30/22 2011 06/01/22 1557 06/02/22 0352  WBC 6.4 12.6* 15.0*  10.4  NEUTROABS  --   --   --  8.5*  HGB 13.7 13.8 13.3 10.9*  HCT 39.2 39.3 38.7* 31.8*  MCV 87.5 87.1 89.6 88.6  PLT 226 272 264 206    LFT Recent Labs  Lab 06/01/22 1557 06/02/22 0352  AST 53* 231*  ALT 54* 169*  ALKPHOS 61 58  BILITOT 0.8 0.8  PROT 7.3 5.7*  ALBUMIN 3.6 2.7*     Antibiotics: Anti-infectives (From admission, onward)    Start     Dose/Rate Route Frequency Ordered Stop   06/02/22 1600  ceFEPIme (MAXIPIME) 2 g in sodium chloride 0.9 % 100 mL IVPB        2 g 200 mL/hr over 30 Minutes Intravenous Every 8 hours 06/02/22 0954     06/01/22 2000  ceFEPIme (MAXIPIME) 2 g in sodium chloride 0.9 % 100 mL IVPB  Status:  Discontinued        2 g 200 mL/hr over 30 Minutes Intravenous Every 12 hours 06/01/22 1855 06/02/22 0954   06/01/22 1645  cefTRIAXone (ROCEPHIN) 2 g in sodium chloride 0.9 % 100 mL IVPB        2 g 200 mL/hr over 30 Minutes Intravenous  Once 06/01/22 1640 06/01/22 1751        DVT prophylaxis: SCDs  Code Status: Full code  Family Communication: No family at bedside   CONSULTS urology   Objective    Physical Examination:   General-appears in no acute distress Heart-S1-S2, regular, no murmur auscultated Lungs-clear to auscultation bilaterally, no wheezing or crackles auscultated Abdomen-soft, nontender, no organomegaly Extremities-no edema in the lower extremities Neuro-alert, oriented x3, no focal deficit noted   Status is: Inpatient: Hypotension, sepsis           [place]   [facility] If 7PM-7AM, please contact night-coverage at [email or web address], Office  [phone]   06/02/2022, 4:29 PM  LOS: 1 day

## 2022-06-02 NOTE — Progress Notes (Signed)
Blood sugar check at this time = 208.  Patient is not diabetic.  Patient just drank 4 cranberry juice and wants to wait until midnight check to  see if coverage is needed.

## 2022-06-02 NOTE — Progress Notes (Addendum)
Urology Inpatient Progress Report     Intv/Subj: Patient admitted last night after being sent to ED for hypotension and tachycardia at preop apt.  His left flank pain has improved.    Principal Problem:   Sepsis ([place]) Active Problems:   Essential hypertension   HLD (hyperlipidemia)   History of kidney stones   AKI (acute kidney injury) ([place])   Hypokalemia   Right ankle pain   DM2 (diabetes mellitus, type 2) (H[facility])   Constipation   UTI (urinary tract infection)   SBO (small bowel obstruction) (H[facility])  Current Facility-Administered Medications  Medication Dose Route Frequency Provider Last Rate Last Admin   0.9 %  sodium chloride infusion  75 mL/hr Intravenous Continuous Doutova, Anastassia, MD 75 mL/hr at 06/02/22 0249 75 mL/hr at 06/02/22 0249   0.9 %  sodium chloride infusion   Intravenous PRN Toy Baker, MD       acetaminophen (TYLENOL) tablet 650 mg  650 mg Oral Q6H PRN Toy Baker, MD       Or   acetaminophen (TYLENOL) suppository 650 mg  650 mg Rectal Q6H PRN Doutova, Anastassia, MD       albuterol (PROVENTIL) (2.5 MG/3ML) 0.083% nebulizer solution 2.5 mg  2.5 mg Nebulization Q4H PRN Doutova, Anastassia, MD       ceFEPIme (MAXIPIME) 2 g in sodium chloride 0.9 % 100 mL IVPB  2 g Intravenous Q12H Emiliano Dyer, RPH   Stopped at 06/01/22 2013   docusate sodium (COLACE) capsule 100 mg  100 mg Oral BID Toy Baker, MD   100 mg at 06/01/22 2207   HYDROcodone-acetaminophen (NORCO/VICODIN) 5-325 MG per tablet 1-2 tablet  1-2 tablet Oral Q4H PRN Toy Baker, MD       insulin aspart (novoLOG) injection 0-9 Units  0-9 Units Subcutaneous Q4H Doutova, Anastassia, MD   1 Units at 06/01/22 2351   polyethylene glycol (MIRALAX / GLYCOLAX) packet 17 g  17 g Oral Daily Doutova, Anastassia, MD   17 g at 06/01/22 2207   rosuvastatin (CRESTOR) tablet 10 mg  10 mg Oral Daily Doutova, Nyoka Lint, MD       tamsulosin (FLOMAX) capsule 0.4 mg  0.4 mg Oral QPC supper  Festus Aloe, MD   0.4 mg at 06/01/22 2213   Current Outpatient Medications  Medication Sig Dispense Refill   albuterol (VENTOLIN HFA) 108 (90 Base) MCG/ACT inhaler INHALE 2 PUFFS INTO THE LUNGS EVERY 4 HOURS AS NEEDED FOR WHEEZING OR SHORTNESS OF BREATH(COUGHING FITS) (Patient taking differently: Inhale 2 puffs into the lungs every 4 (four) hours as needed for wheezing or shortness of breath.) 8.5 g 1   amLODipine (NORVASC) 5 MG tablet TAKE 1 TABLET(5 MG) BY MOUTH DAILY (Patient taking differently: Take 5 mg by mouth daily.) 90 tablet 3   Cephalexin 500 MG tablet Take 500 mg by mouth 2 (two) times daily.     finasteride (PROSCAR) 5 MG tablet Take 1 tablet (5 mg total) by mouth daily. 30 tablet 1   lisinopril (ZESTRIL) 40 MG tablet Take 1 tablet (40 mg total) by mouth daily. 90 tablet 3   ondansetron (ZOFRAN-ODT) 4 MG disintegrating tablet '4mg'$  ODT q4 hours prn nausea/vomit (Patient taking differently: Take 4 mg by mouth every 4 (four) hours as needed for nausea or vomiting.) 10 tablet 0   oxybutynin (DITROPAN) 5 MG tablet Take 1 tablet (5 mg total) by mouth every 8 (eight) hours as needed for bladder spasms. 30 tablet 0   oxyCODONE-acetaminophen (PERCOCET/ROXICET) 5-325 MG tablet  Take 1 tablet by mouth every 6 (six) hours as needed for severe pain.     rosuvastatin (CRESTOR) 10 MG tablet TAKE 1 TABLET(10 MG) BY MOUTH DAILY (Patient taking differently: Take 10 mg by mouth daily.) 90 tablet 1   Budeson-Glycopyrrol-Formoterol (BREZTRI AEROSPHERE) 160-9-4.8 MCG/ACT AERO Inhale two puffs with spacer twice daily to prevent cough or wheeze.  Rinse, gargle, and spit after use. 10.7 g 5   clobetasol ointment (TEMOVATE) 7.51 % Apply 1 application. topically 2 (two) times daily as needed (psoriasis).     ketorolac (TORADOL) 10 MG tablet Take 10 mg by mouth every 8 (eight) hours as needed for moderate pain or severe pain.     tamsulosin (FLOMAX) 0.4 MG CAPS capsule Take 1 capsule (0.4 mg total) by mouth at  bedtime. 30 capsule 1     Objective: Vital: Vitals:   06/02/22 0251 06/02/22 0300 06/02/22 0330 06/02/22 0630  BP:  (!) 116/91 117/88 139/90  Pulse:  79 74 78  Resp:  (!) 26 (!) 22 (!) 23  Temp: 98.3 F (36.8 C)  98.2 F (36.8 C)   TempSrc: Oral     SpO2:  96% 96% 94%  Weight:      Height:       I/Os: I/O last 3 completed shifts: In: 100 [IV Piggyback:100] Out: 52 [Urine:650]  Physical Exam:  General: Patient is in no apparent distress Lungs: Normal respiratory effort, chest expands symmetrically. GI: soft, mildly distended, mildly ttp diffusely; no flank ttp GU: urinal at bedside with yellow urine Ext: lower extremities symmetric  Lab Results: Recent Labs    05/30/22 2011 06/01/22 1557 06/02/22 0352  WBC 12.6* 15.0* 10.4  HGB 13.8 13.3 10.9*  HCT 39.3 38.7* 31.8*   Recent Labs    05/30/22 2011 06/01/22 1557 06/02/22 0352  NA 134* 133* 133*  K 3.8 3.2* 3.5  CL 105 97* 101  CO2 20* 23 22  GLUCOSE 165* 108* 110*  BUN 12 26* 16  CREATININE 1.15 1.84* 0.89  CALCIUM 8.8* 8.7* 8.0*   No results for input(s): "LABPT", "INR" in the last 72 hours. No results for input(s): "LABURIN" in the last 72 hours. Results for orders placed or performed during the hospital encounter of 05/18/22  Urine Culture     Status: None   Collection Time: 05/18/22  2:00 PM   Specimen: Urine, Clean Catch  Result Value Ref Range Status   Specimen Description URINE, CLEAN CATCH  Final   Special Requests NONE  Final   Culture   Final    NO GROWTH Performed at [facility], [place] [address] [place], [place] [address]    Report Status 05/19/2022 FINAL  Final    Studies/Results: CT ABDOMEN PELVIS WO CONTRAST  Result Date: 06/02/2022 CLINICAL DATA:  Abdominal pain.  Concern for bowel obstruction. EXAM: CT ABDOMEN AND PELVIS WITHOUT CONTRAST TECHNIQUE: Multidetector CT imaging of the abdomen and pelvis was performed following the standard protocol without IV contrast. RADIATION  DOSE REDUCTION: This exam was performed according to the departmental dose-optimization program which includes automated exposure control, adjustment of the mA and/or kV according to patient size and/or use of iterative reconstruction technique. COMPARISON:  CT abdomen pelvis dated 05/30/2022. FINDINGS: Evaluation of this exam is limited in the absence of intravenous contrast. Lower chest: The visualized lung bases are clear. There is coronary vascular calcification. No intra-abdominal free air or free fluid. Hepatobiliary: There is unremarkable. No intrahepatic biliary ductal dilatation. Cholecystectomy. No retained calcified  stone noted in the central CBD. Pancreas: Unremarkable. No pancreatic ductal dilatation or surrounding inflammatory changes. Spleen: Normal in size without focal abnormality. Adrenals/Urinary Tract: The adrenal glands unremarkable. Mild left hydronephrosis, new or increased since the prior CT. Similar positioning of the left ureteral stent. Several small nonobstructing left renal inferior pole calculi. A 3 mm stone in the distal left ureter along the stent in similar location as the prior CT. There is no hydronephrosis or nephrolithiasis on right. The right ureter and urinary bladder appear unremarkable. Stomach/Bowel: Oral contrast opacifies the stomach and multiple loops of small bowel. There is no bowel obstruction or active inflammation. Appendectomy. Vascular/Lymphatic: Mild aortoiliac atherosclerotic disease. The IVC is grossly unremarkable. No portal venous gas. There is no adenopathy. Reproductive: Enlarged prostate gland measuring 5.3 cm in transverse axial diameter. There is lobulated median lobe hypertrophy indenting and protruding through the base of the bladder. Other: None Musculoskeletal: No acute or significant osseous findings. IMPRESSION: 1. No bowel obstruction. 2. Mild left hydronephrosis, new or increased since the prior CT. Similar positioning of the left ureteral stent.  A 3 mm stone in the distal left ureter along the stent in similar location as the prior CT. Several small nonobstructing left renal inferior pole calculi. 3. Enlarged prostate gland with lobulated median lobe hypertrophy indenting and protruding through the base of the bladder. 4. Aortic Atherosclerosis (ICD10-I70.0). Electronically Signed   By: Anner Crete M.D.   On: 06/02/2022 02:44   DG Abd 1 View  Result Date: 06/01/2022 CLINICAL DATA:  Preoperative. EXAM: ABDOMEN - 1 VIEW COMPARISON:  Abdominal x-ray 05/26/2022. CT abdomen and pelvis 05/30/2022. FINDINGS: Left-sided ureteral stent appears unchanged in position. There surgical clips in the right abdomen, unchanged from the prior examination. There are new dilated small bowel loops measuring up to 3.8 cm throughout the mid and upper abdomen. There is air seen throughout nondilated colon to the level of the rectum. No acute fractures are seen. There are phleboliths in the left hemipelvis. IMPRESSION: 1. New dilated mid and proximal small bowel concerning for small bowel obstruction. 2. Left ureteral stent is unchanged in position. Electronically Signed   By: Ronney Asters M.D.   On: 06/01/2022 21:11    Assessment/Plan: Left ureteral calculus: -still present in left ureter on CT overnight with stent in good position -mild hydro but flank pain has actually improved (bladder distended on CT which is likely contributing to this finding) -renal function improved and back to baseline -leukocytosis has improved also  2. Left stent pain: -improved and will keep plan for ureteroscopy on 6/13  -worry if that intervention sooner than anatomy will prohibit removal of stones based on first surgery operative report   Jacalyn Lefevre, MD Urology 06/02/2022, 7:27 AM

## 2022-06-02 NOTE — ED Notes (Signed)
Pt stated he voided on himself by accident, provided wash clothes, soap per request, new gown, new socks.

## 2022-06-02 NOTE — Progress Notes (Signed)
Pharmacy Antibiotic Note  Rickey Cruz is a 61 y.o. male admitted on 06/01/2022 with UTI.  Pharmacy has been consulted for Cefepime dosing.  Plan: Increase Cefepime 2g IV q8h for improved renal function Follow up renal function & cultures  Height: '5\' 6"'$  (167.6 cm) Weight: 82.6 kg (182 lb) IBW/kg (Calculated) : 63.8  Temp (24hrs), Avg:98.6 F (37 C), Min:98.2 F (36.8 C), Max:99.1 F (37.3 C)  Recent Labs  Lab 05/28/22 1400 05/30/22 2011 06/01/22 1557 06/01/22 1810 06/01/22 2200 06/02/22 0020 06/02/22 0352  WBC 6.4 12.6* 15.0*  --   --   --  10.4  CREATININE 1.03 1.15 1.84*  --   --   --  0.89  LATICACIDVEN  --   --  3.0* 1.0 1.0 1.0  --      Estimated Creatinine Clearance: 89 mL/min (by C-G formula based on SCr of 0.89 mg/dL).    No Known Allergies  Antimicrobials this admission: 6/7 CTX x 1 6/7 Cefepime >>  Dose adjustments this admission:  Microbiology results: 6/7 BCx: 6/7 UCx:  Thank you for allowing pharmacy to be a part of this patient's care.  Peggyann Juba, PharmD, BCPS Pharmacy: [phone] 06/02/2022 9:57 AM

## 2022-06-03 ENCOUNTER — Inpatient Hospital Stay (HOSPITAL_COMMUNITY): Payer: BC Managed Care – PPO

## 2022-06-03 DIAGNOSIS — A419 Sepsis, unspecified organism: Secondary | ICD-10-CM | POA: Diagnosis not present

## 2022-06-03 DIAGNOSIS — I10 Essential (primary) hypertension: Secondary | ICD-10-CM | POA: Diagnosis not present

## 2022-06-03 DIAGNOSIS — E1122 Type 2 diabetes mellitus with diabetic chronic kidney disease: Secondary | ICD-10-CM | POA: Diagnosis not present

## 2022-06-03 DIAGNOSIS — N179 Acute kidney failure, unspecified: Secondary | ICD-10-CM | POA: Diagnosis not present

## 2022-06-03 LAB — COMPREHENSIVE METABOLIC PANEL
ALT: 360 U/L — ABNORMAL HIGH (ref 0–44)
AST: 441 U/L — ABNORMAL HIGH (ref 15–41)
Albumin: 3.1 g/dL — ABNORMAL LOW (ref 3.5–5.0)
Alkaline Phosphatase: 109 U/L (ref 38–126)
Anion gap: 9 (ref 5–15)
BUN: 11 mg/dL (ref 6–20)
CO2: 25 mmol/L (ref 22–32)
Calcium: 8.4 mg/dL — ABNORMAL LOW (ref 8.9–10.3)
Chloride: 102 mmol/L (ref 98–111)
Creatinine, Ser: 0.93 mg/dL (ref 0.61–1.24)
GFR, Estimated: >60 mL/min (ref >60)
Glucose, Bld: 117 mg/dL — ABNORMAL HIGH (ref 70–99)
Potassium: 3.3 mmol/L — ABNORMAL LOW (ref 3.5–5.1)
Sodium: 136 mmol/L (ref 135–145)
Total Bilirubin: 1 mg/dL (ref 0.3–1.2)
Total Protein: 6.6 g/dL (ref 6.5–8.1)

## 2022-06-03 LAB — CBC
HCT: 37.7 % — ABNORMAL LOW (ref 39.0–52.0)
Hemoglobin: 13 g/dL (ref 13.0–17.0)
MCH: 30.5 pg (ref 26.0–34.0)
MCHC: 34.5 g/dL (ref 30.0–36.0)
MCV: 88.5 fL (ref 80.0–100.0)
Platelets: 259 10*3/uL (ref 150–400)
RBC: 4.26 MIL/uL (ref 4.22–5.81)
RDW: 12.3 % (ref 11.5–15.5)
WBC: 9.8 10*3/uL (ref 4.0–10.5)
nRBC: 0 % (ref 0.0–0.2)

## 2022-06-03 LAB — URINE CULTURE: Culture: 20000 — AB

## 2022-06-03 LAB — GLUCOSE, CAPILLARY
Glucose-Capillary: 125 mg/dL — ABNORMAL HIGH (ref 70–99)
Glucose-Capillary: 126 mg/dL — ABNORMAL HIGH (ref 70–99)
Glucose-Capillary: 127 mg/dL — ABNORMAL HIGH (ref 70–99)
Glucose-Capillary: 135 mg/dL — ABNORMAL HIGH (ref 70–99)
Glucose-Capillary: 135 mg/dL — ABNORMAL HIGH (ref 70–99)
Glucose-Capillary: 151 mg/dL — ABNORMAL HIGH (ref 70–99)
Glucose-Capillary: 168 mg/dL — ABNORMAL HIGH (ref 70–99)

## 2022-06-03 IMAGING — US US ABDOMEN LIMITED
1 series · 14 of 25 positions shown · non-contrast
Comparison: None Available.

CLINICAL DATA: Abnormal liver function tests

EXAM:
ULTRASOUND ABDOMEN LIMITED RIGHT UPPER QUADRANT

[Series 1: us abdomen limited · 14 of 28 slices shown]
[im 1/28]
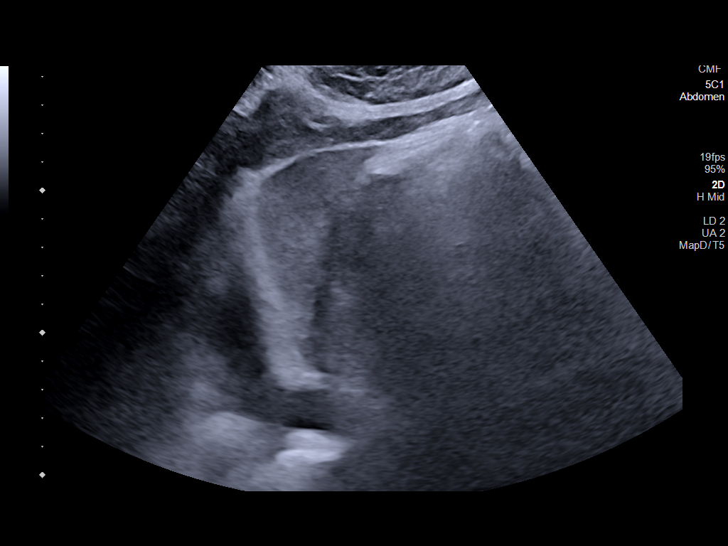
[im 3/28]
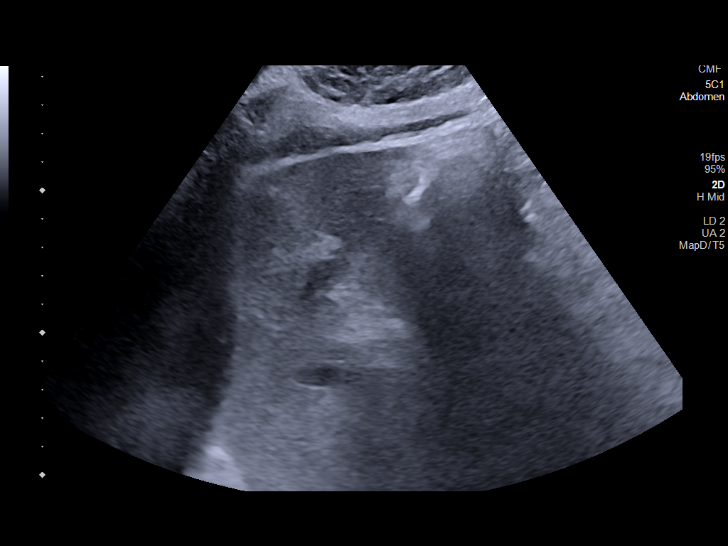
[im 5/28]
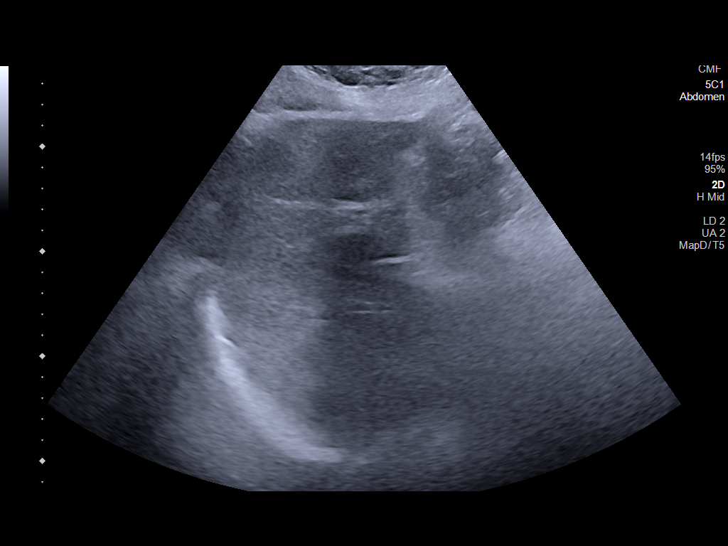
[im 7/28]
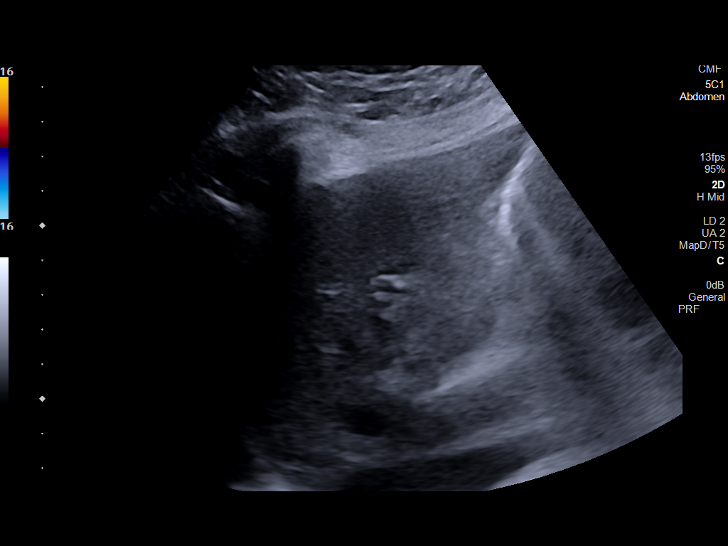
[im 10/28]
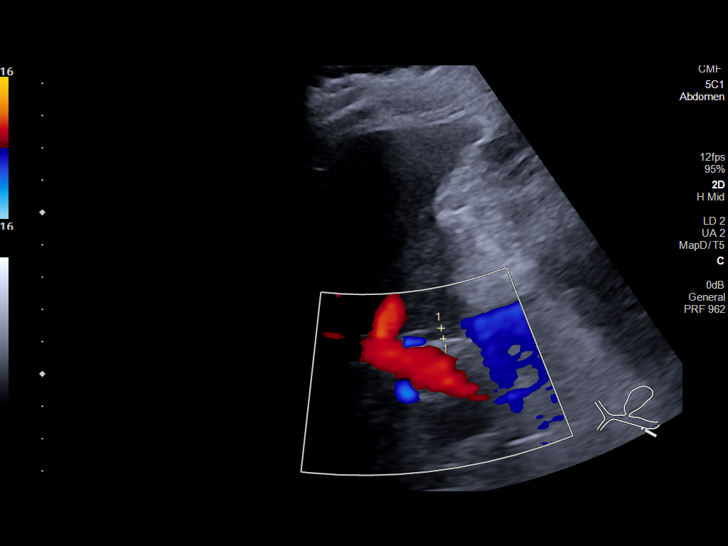
[im 11/28]
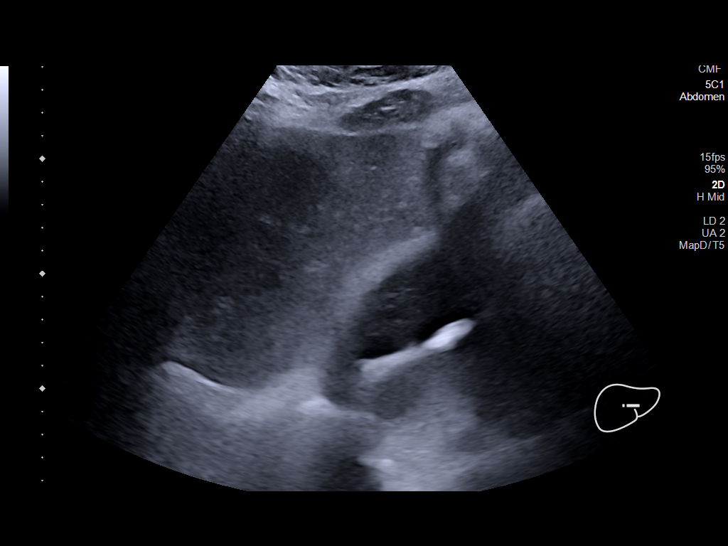
[im 13/28]
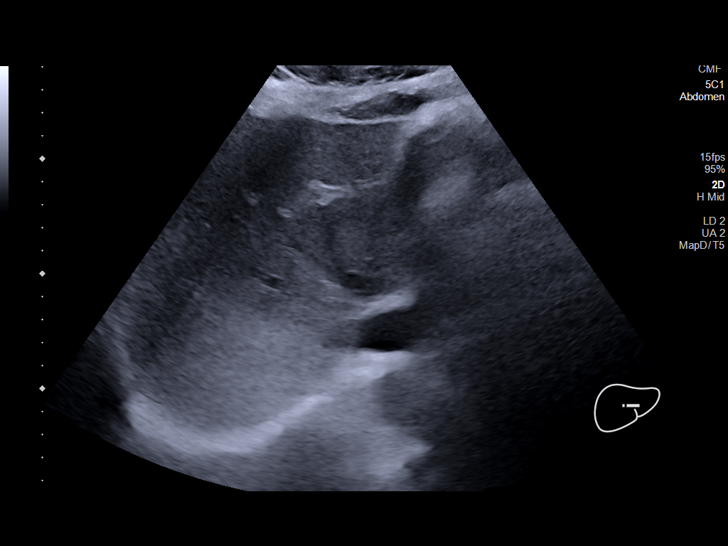
[im 15/28]
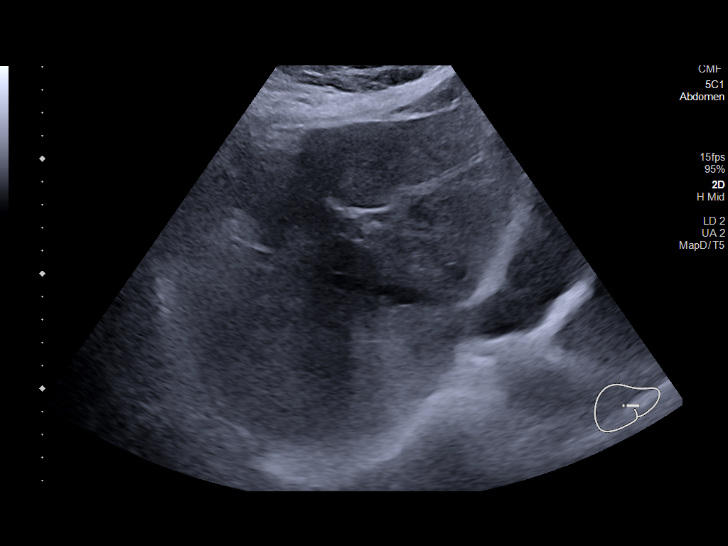
[im 17/28]
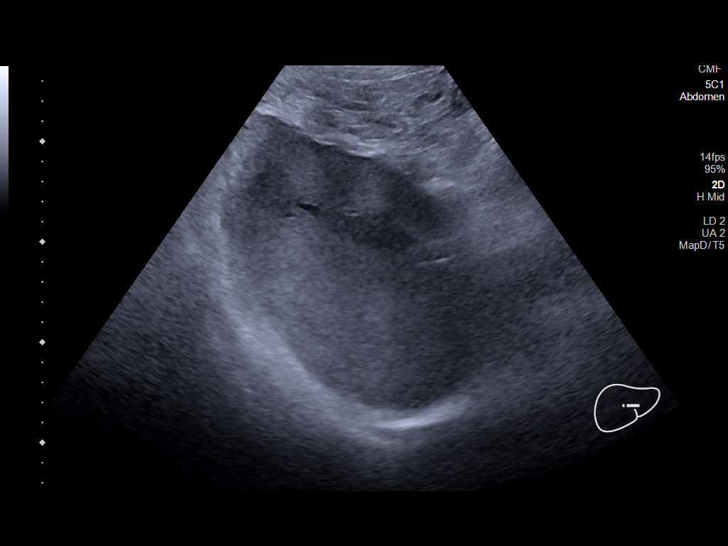
[im 19/28]
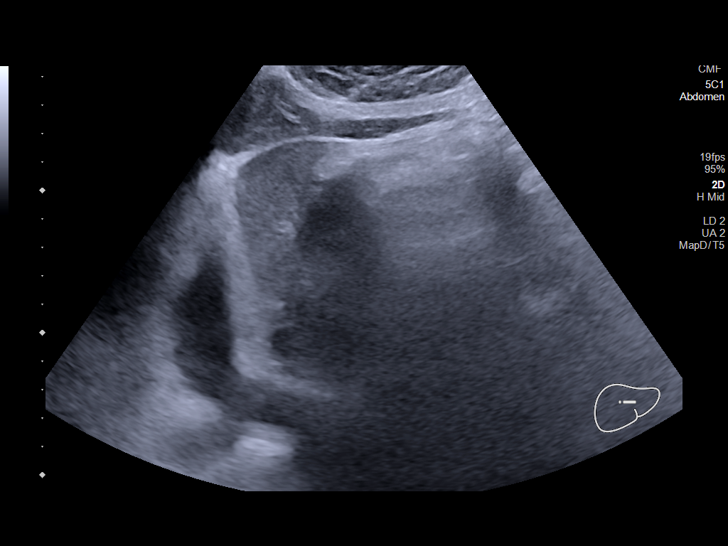
[im 21/28]
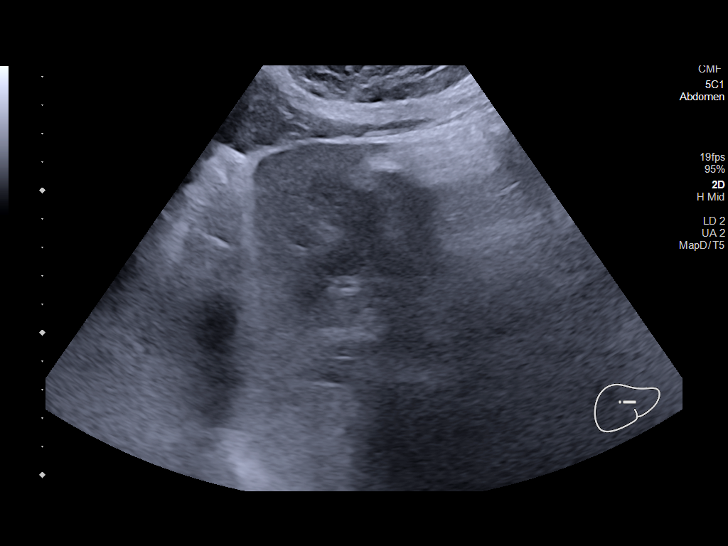
[im 23/28]
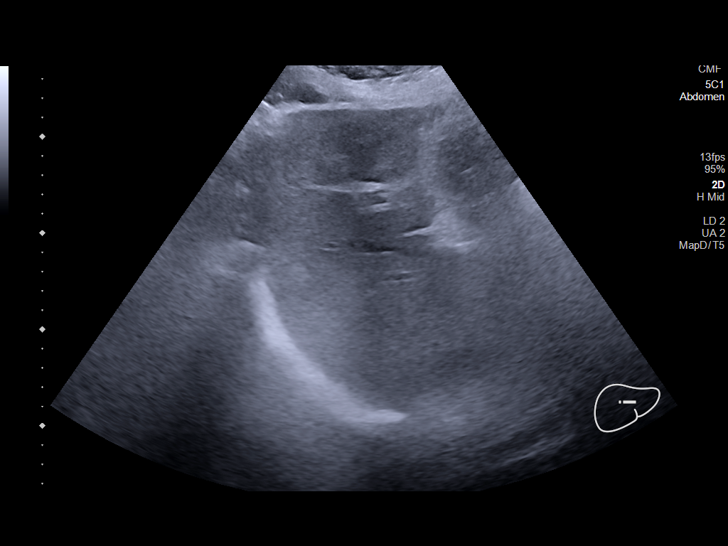
[im 25/28]
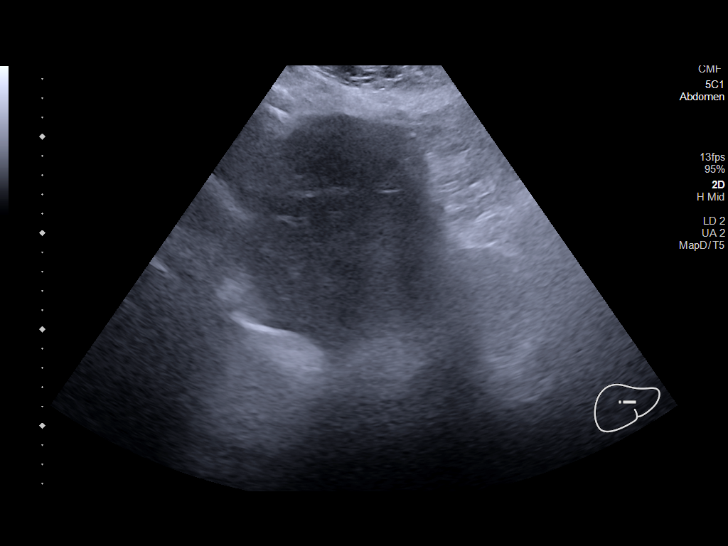
[im 28/28]
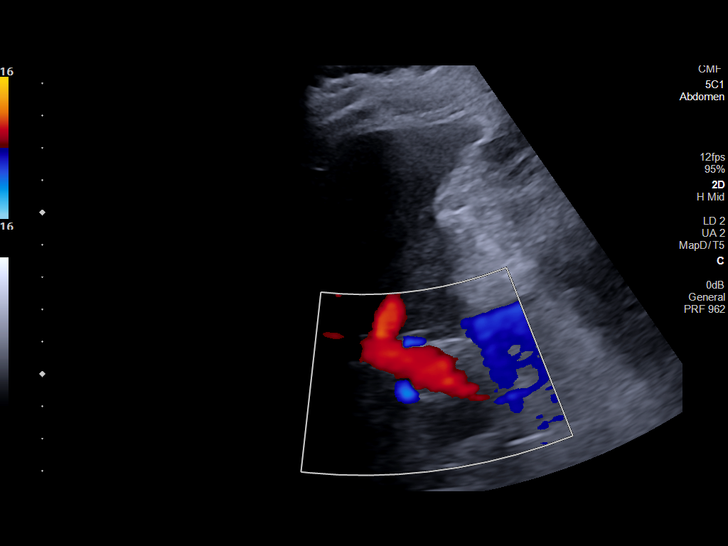

[14 of 25 positions shown; findings below may reference images not displayed]

FINDINGS: Gallbladder:

Not seen consistent with cholecystectomy.

Common bile duct:

Diameter: 3.4 mm

Liver:

There is increased echogenicity in the liver. No focal abnormality
is seen in the visualized portions of liver. Portal vein is patent
on color Doppler imaging with normal direction of blood flow towards
the liver.

Other: None.
IMPRESSION: Status post cholecystectomy. There is no significant dilation of
bile ducts. Fatty liver.

## 2022-06-03 MED ORDER — OXYBUTYNIN CHLORIDE ER 5 MG PO TB24
10.0000 mg | ORAL_TABLET | Freq: Every day | ORAL | Status: DC
  Administered 2022-06-03 – 2022-06-05 (×3): 10 mg via ORAL
  Filled 2022-06-03 (×3): qty 2

## 2022-06-03 MED ORDER — POTASSIUM CHLORIDE CRYS ER 20 MEQ PO TBCR
40.0000 meq | EXTENDED_RELEASE_TABLET | Freq: Once | ORAL | Status: AC
  Administered 2022-06-03: 40 meq via ORAL
  Filled 2022-06-03: qty 2

## 2022-06-03 MED ORDER — AMPICILLIN SODIUM 2 G IJ SOLR
2.0000 g | Freq: Four times a day (QID) | INTRAMUSCULAR | Status: DC
Start: 2022-06-03 — End: 2022-06-06
  Administered 2022-06-03 – 2022-06-06 (×13): 2 g via INTRAVENOUS
  Filled 2022-06-03 (×12): qty 2000
  Filled 2022-06-03: qty 2
  Filled 2022-06-03: qty 2000

## 2022-06-03 MED ORDER — KETOROLAC TROMETHAMINE 30 MG/ML IJ SOLN
30.0000 mg | Freq: Once | INTRAMUSCULAR | Status: DC
Start: 2022-06-03 — End: 2022-06-03

## 2022-06-03 MED ORDER — KETOROLAC TROMETHAMINE 30 MG/ML IJ SOLN
30.0000 mg | Freq: Three times a day (TID) | INTRAMUSCULAR | Status: DC | PRN
  Administered 2022-06-04 – 2022-06-05 (×2): 30 mg via INTRAVENOUS
  Filled 2022-06-03 (×3): qty 1

## 2022-06-03 NOTE — Progress Notes (Addendum)
[facility]  PROGRESS NOTE  Rickey Cruz HUT:[id number] DOB: January 17, 1961 DOA: 06/01/2022 PCP: Horald Pollen, MD   Brief HPI:   61 year old male with history of hypertension, diabetes mellitus type 2, kidney stones presented with hypotension.  While undergoing preop clearance at surgery center he was found to have blood pressure of 89/67, appeared short of breath and ill-appearing.  Patient has been followed by urology.  He has an obstructing left ureteral stone measuring 5 mm noted on 09 May 2022, s/p ureteral stent placement.  He has been experiencing urinary retention, had a catheter placed and was removed on 5th June. In the ED patient responded to IV fluid challenge, no issue noted to be significant for UTI.  He was started on Rocephin and urology was consulted. CT abdomen/pelvis showed left-sided ureteral stent remain in place.  Previous small stone in the distal ureter adjacent to stent is unchanged.  No hydronephrosis.  Nonobstructing stone in the lower pole of left kidney.  Urinary bladder essentially empty.  Foley catheter has been removed.  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    Subjective   Patient seen and examined, Foley catheter was placed yesterday as postvoid residual showed 683 mL of urine.   Assessment/Plan:    Sepsis due to UTI -Patient started on cefepime for possible sepsis due to UTI -He presented with sinus tachycardia, hypotension, responded to IV fluids -Sepsis resolved he has resolved -Urine culture grew Enterococcus, antibiotics changed to ampicillin  Hypertension -Blood pressure is now elevated -Patient started amlodipine 5 mg daily  Diabetes mellitus type 2 -Continue sliding scale insulin with NovoLog -CBG well controlled  Dysuria/left ureteral stent in place -Patient still has significant pain on urination -Called and discussed with urologist on-call Dr. McDermiad -Postvoid residual was 683 able.  Foley  catheter inserted  Acute kidney injury -Presents with creatinine of 1.84; baseline creatinine 1.15 -Resolved with IV fluids -Today creatinine 0.89  Transaminitis -Unclear etiology -CT abdomen pelvis obtained yesterday was unremarkable -Likely shock liver -LFTs are rising. 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  Small bowel obstruction -There was a question of SBO on KUB -CT abdomen/pelvis obtained shows no bowel obstruction -Started on regular diet    Medications     amLODipine  5 mg Oral Daily   Chlorhexidine Gluconate Cloth  6 each Topical Daily   docusate sodium  100 mg Oral BID   insulin aspart  0-9 Units Subcutaneous Q4H   polyethylene glycol  17 g Oral Daily   rosuvastatin  10 mg Oral Daily   tamsulosin  0.4 mg Oral QPC supper     Data Reviewed:   CBG:  Recent Labs  Lab 06/02/22 2150 06/03/22 0003 06/03/22 0500 06/03/22 0737 06/03/22 1135  GLUCAP 208* 151* 127* 135* 135*    SpO2: 95 %    Vitals:   06/03/22 1142 06/03/22 1146 06/03/22 1300 06/03/22 1412  BP:  (!) 149/95  116/80  Pulse:  95  94  Resp:  (!) 21  18  Temp: (!) 102.6 F (39.2 C)  99.7 F (37.6 C) 98.6 F (37 C)  TempSrc: Oral  Oral   SpO2:  94%  95%  Weight:  Height:          Data Reviewed:  Basic Metabolic Panel: Recent Labs  Lab 05/28/22 1400 05/30/22 2011 06/01/22 1557 06/02/22 0352 06/03/22 0414  NA 137 134* 133* 133* 136  K 3.3* 3.8 3.2* 3.5 3.3*  CL 105 105 97* 101 102  CO2 23 20* '23 22 25  '$ GLUCOSE 105* 165* 108* 110* 117*  BUN 12 12 26* 16 11  CREATININE 1.03 1.15 1.84* 0.89 0.93  CALCIUM 8.9 8.8* 8.7* 8.0* 8.4*  MG  --   --   --  1.9  --    PHOS  --   --   --  2.4*  --     CBC: Recent Labs  Lab 05/28/22 1400 05/30/22 2011 06/01/22 1557 06/02/22 0352 06/03/22 0414  WBC 6.4 12.6* 15.0* 10.4 9.8  NEUTROABS  --   --   --  8.5*  --   HGB 13.7 13.8 13.3 10.9* 13.0  HCT 39.2 39.3 38.7* 31.8* 37.7*  MCV 87.5 87.1 89.6 88.6 88.5  PLT 226 272 264 206 259    LFT Recent Labs  Lab 06/01/22 1557 06/02/22 0352 06/03/22 0414  AST 53* 231* 441*  ALT 54* 169* 360*  ALKPHOS 61 58 109  BILITOT 0.8 0.8 1.0  PROT 7.3 5.7* 6.6  ALBUMIN 3.6 2.7* 3.1*     Antibiotics: Anti-infectives (From admission, onward)    Start     Dose/Rate Route Frequency Ordered Stop   06/03/22 1200  ampicillin (OMNIPEN) 2 g in sodium chloride 0.9 % 100 mL IVPB        2 g 300 mL/hr over 20 Minutes Intravenous Every 6 hours 06/03/22 0939     06/02/22 1600  ceFEPIme (MAXIPIME) 2 g in sodium chloride 0.9 % 100 mL IVPB  Status:  Discontinued        2 g 200 mL/hr over 30 Minutes Intravenous Every 8 hours 06/02/22 0954 06/03/22 0939   06/01/22 2000  ceFEPIme (MAXIPIME) 2 g in sodium chloride 0.9 % 100 mL IVPB  Status:  Discontinued        2 g 200 mL/hr over 30 Minutes Intravenous Every 12 hours 06/01/22 1855 06/02/22 0954   06/01/22 1645  cefTRIAXone (ROCEPHIN) 2 g in sodium chloride 0.9 % 100 mL IVPB        2 g 200 mL/hr over 30 Minutes Intravenous  Once 06/01/22 1640 06/01/22 1751        DVT prophylaxis: SCDs  Code Status: Full code  Family Communication: No family at bedside   CONSULTS urology   Objective    Physical Examination:   General-appears in no acute distress Heart-S1-S2, regular, no murmur auscultated Lungs-clear to auscultation bilaterally, no wheezing or crackles auscultated Abdomen-soft, nontender, no organomegaly Extremities-no edema in the lower extremities Neuro-alert, oriented x3, no focal deficit noted   Status is: Inpatient: Hypotension, sepsis        [place]   [facility] If 7PM-7AM,  please contact night-coverage at [email or web address], Office  [phone]   06/03/2022, 3:19 PM  LOS: 2 days

## 2022-06-03 NOTE — Progress Notes (Signed)
Patient having rigors/chills. Temp is 98.7 (checked x2). MD [place] successfully notified. Pt awake & A/Ox4. Patient did have a fever of 102.7 earlier today that resolved with tylenol.

## 2022-06-03 NOTE — Plan of Care (Signed)
  Problem: Respiratory: Goal: Ability to maintain adequate ventilation will improve Outcome: Progressing   Problem: Education: Goal: Knowledge of General Education information will improve Description: Including pain rating scale, medication(s)/side effects and non-pharmacologic comfort measures Outcome: Progressing   Problem: Coping: Goal: Level of anxiety will decrease Outcome: Progressing   Problem: Pain Managment: Goal: General experience of comfort will improve Outcome: Progressing   Problem: Safety: Goal: Ability to remain free from injury will improve Outcome: Progressing   Problem: Skin Integrity: Goal: Risk for impaired skin integrity will decrease Outcome: Progressing

## 2022-06-03 NOTE — TOC Initial Note (Signed)
Transition of Care [facility]) - Initial/Assessment Note    Patient Details  Name: Rickey Cruz MRN: [id number] Date of Birth: 02-Sep-1961  Transition of Care [facility]) CM/SW Contact:    Leeroy Cha, RN Phone Number: 06/03/2022, 8:29 AM  Clinical Narrative:                  Transition of Care [facility]) Screening Note   Patient Details  Name: Rickey Cruz Date of Birth: 04-26-1961   Transition of Care [facility]) CM/SW Contact:    Leeroy Cha, RN Phone Number: 06/03/2022, 8:29 AM    Transition of Care Department [facility]) has reviewed patient and no TOC needs have been identified at this time. We will continue to monitor patient advancement through interdisciplinary progression rounds. If new patient transition needs arise, please place a TOC consult.    Expected Discharge Plan: Home/Self Care Barriers to Discharge: Continued Medical Work up   Patient Goals and CMS Choice Patient states their goals for this hospitalization and ongoing recovery are:: to go back home CMS Medicare.gov Compare Post Acute Care list provided to:: Patient Choice offered to / list presented to : Patient  Expected Discharge Plan and Services Expected Discharge Plan: Home/Self Care   Discharge Planning Services: CM Consult   Living arrangements for the past 2 months: Apartment                                      Prior Living Arrangements/Services Living arrangements for the past 2 months: Apartment Lives with:: Self Patient language and need for interpreter reviewed:: Yes Do you feel safe going back to the place where you live?: Yes            Criminal Activity/Legal Involvement Pertinent to Current Situation/Hospitalization: No - Comment as needed  Activities of Daily Living Home Assistive Devices/Equipment: None ADL Screening (condition at time of admission) Patient's cognitive ability adequate to safely complete daily activities?: Yes Is the patient deaf or have difficulty hearing?:  No Does the patient have difficulty seeing, even when wearing glasses/contacts?: No Does the patient have difficulty concentrating, remembering, or making decisions?: No Patient able to express need for assistance with ADLs?: Yes Does the patient have difficulty dressing or bathing?: No Independently performs ADLs?: Yes (appropriate for developmental age) Does the patient have difficulty walking or climbing stairs?: Yes Weakness of Legs: Both Weakness of Arms/Hands: Both  Permission Sought/Granted                  Emotional Assessment Appearance:: Appears stated age     Orientation: : Oriented to Place, Oriented to Self, Oriented to  Time, Oriented to Situation Alcohol / Substance Use: Not Applicable Psych Involvement: No (comment)  Admission diagnosis:  Sepsis ([place]) [A41.9] Acute sepsis [facility]) [A41.9] Patient Active Problem List   Diagnosis Date Noted   Sepsis ([place]) 06/01/2022   Hypokalemia 06/01/2022   Right ankle pain 06/01/2022   DM2 (diabetes mellitus, type 2) ([place]) 06/01/2022   Constipation 06/01/2022   UTI (urinary tract infection) 06/01/2022   SBO (small bowel obstruction) ([place]) 06/01/2022   Intractable pain 05/21/2022   Obstructive uropathy 05/21/2022   AKI (acute kidney injury) ([place]) 05/21/2022   Moderate persistent asthma with acute exacerbation 04/28/2022   Allergic rhinitis 04/28/2022   Back pain 04/28/2022   DOE (dyspnea on exertion) 04/28/2022   History of prostate cancer 04/25/2022   History of kidney stones 04/25/2022  Rosacea 04/19/2022   Malignant neoplasm of prostate ([place]) 04/19/2022   Enlarged prostate 01/12/2022   Neck pain 08/10/2021   Jammed interphalangeal joint of finger of left hand 03/10/2021   Closed nondisplaced fracture of fifth metacarpal bone of right hand 03/10/2021   Closed nondisplaced fracture of shaft of fourth metacarpal bone of right hand 03/10/2021   Prediabetes 04/02/2019   HLD (hyperlipidemia) 12/23/2014   Psoriasis  06/27/2014   Multiple lung nodules 12/09/2011   Musculoskeletal back pain 06/10/2010   PULMONARY HYPERTENSION 03/04/2010   Essential hypertension 02/10/2010   OBSTRUCTIVE SLEEP APNEA 02/04/2010   PCP:  Horald Pollen, MD Pharmacy:   [facility] [place], [place] - [place] AT [facility] OF [address] [place] [address] Phone: [phone] Fax: [phone]     Social Determinants of Health ([place]) Interventions    Readmission Risk Interventions    06/03/2022    8:27 AM  Readmission Risk Prevention Plan  Transportation Screening Complete  PCP or Specialist Appt within 5-7 Days Complete  Home Care Screening Complete  Medication Review (RN CM) Complete

## 2022-06-03 NOTE — Progress Notes (Signed)
   06/03/22 1936  Assess: MEWS Score  Temp (!) 100.5 F (38.1 C)  BP 132/81  MAP (mmHg) 97  Pulse Rate (!) 113  ECG Heart Rate (!) 113  Resp 14  Level of Consciousness Alert  SpO2 96 %  O2 Device Room Air  Patient Activity (if Appropriate) In bed  Assess: MEWS Score  MEWS Temp 1  MEWS Systolic 0  MEWS Pulse 2  MEWS RR 0  MEWS LOC 0  MEWS Score 3  MEWS Score Color Yellow  Assess: if the MEWS score is Yellow or Red  Were vital signs taken at a resting state? Yes  Focused Assessment No change from prior assessment  Does the patient meet 2 or more of the SIRS criteria? No  MEWS guidelines implemented *See Row Information* Yes  Treat  MEWS Interventions Administered prn meds/treatments  Pain Scale 0-10  Pain Score 0  Complains of Fever  Interventions Medication (see MAR)  Patients response to intervention Decreased  Take Vital Signs  Increase Vital Sign Frequency  Yellow: Q 2hr X 2 then Q 4hr X 2, if remains yellow, continue Q 4hrs  Notify: Charge Nurse/RN  Name of Charge Nurse/RN Notified Si Gaul, RN  Date Charge Nurse/RN Notified 06/03/22  Time Charge Nurse/RN Notified 2025  Document  Patient Outcome Stabilized after interventions  Progress note created (see row info) Yes  Assess: SIRS CRITERIA  SIRS Temperature  0  SIRS Pulse 1  SIRS Respirations  0  SIRS WBC 0  SIRS Score Sum  1

## 2022-06-04 DIAGNOSIS — A419 Sepsis, unspecified organism: Secondary | ICD-10-CM | POA: Diagnosis not present

## 2022-06-04 DIAGNOSIS — N179 Acute kidney failure, unspecified: Secondary | ICD-10-CM | POA: Diagnosis not present

## 2022-06-04 DIAGNOSIS — Z87442 Personal history of urinary calculi: Secondary | ICD-10-CM | POA: Diagnosis not present

## 2022-06-04 DIAGNOSIS — I10 Essential (primary) hypertension: Secondary | ICD-10-CM | POA: Diagnosis not present

## 2022-06-04 LAB — COMPREHENSIVE METABOLIC PANEL
ALT: 196 U/L — ABNORMAL HIGH (ref 0–44)
AST: 97 U/L — ABNORMAL HIGH (ref 15–41)
Albumin: 2.5 g/dL — ABNORMAL LOW (ref 3.5–5.0)
Alkaline Phosphatase: 83 U/L (ref 38–126)
Anion gap: 10 (ref 5–15)
BUN: 9 mg/dL (ref 6–20)
CO2: 23 mmol/L (ref 22–32)
Calcium: 7.9 mg/dL — ABNORMAL LOW (ref 8.9–10.3)
Chloride: 101 mmol/L (ref 98–111)
Creatinine, Ser: 1.02 mg/dL (ref 0.61–1.24)
GFR, Estimated: >60 mL/min (ref >60)
Glucose, Bld: 129 mg/dL — ABNORMAL HIGH (ref 70–99)
Potassium: 3.4 mmol/L — ABNORMAL LOW (ref 3.5–5.1)
Sodium: 134 mmol/L — ABNORMAL LOW (ref 135–145)
Total Bilirubin: 1 mg/dL (ref 0.3–1.2)
Total Protein: 5.6 g/dL — ABNORMAL LOW (ref 6.5–8.1)

## 2022-06-04 LAB — GLUCOSE, CAPILLARY
Glucose-Capillary: 132 mg/dL — ABNORMAL HIGH (ref 70–99)
Glucose-Capillary: 106 mg/dL — ABNORMAL HIGH (ref 70–99)
Glucose-Capillary: 114 mg/dL — ABNORMAL HIGH (ref 70–99)
Glucose-Capillary: 158 mg/dL — ABNORMAL HIGH (ref 70–99)
Glucose-Capillary: 92 mg/dL (ref 70–99)

## 2022-06-04 LAB — HEPATITIS PANEL, ACUTE
HCV Ab: NONREACTIVE
Hep A IgM: NONREACTIVE
Hep B C IgM: NONREACTIVE
Hepatitis B Surface Ag: NONREACTIVE

## 2022-06-04 LAB — LACTIC ACID, PLASMA: Lactic Acid, Venous: 0.8 mmol/L (ref 0.5–1.9)

## 2022-06-04 LAB — CBC
HCT: 31.7 % — ABNORMAL LOW (ref 39.0–52.0)
Hemoglobin: 11.2 g/dL — ABNORMAL LOW (ref 13.0–17.0)
MCH: 30.8 pg (ref 26.0–34.0)
MCHC: 35.3 g/dL (ref 30.0–36.0)
MCV: 87.1 fL (ref 80.0–100.0)
Platelets: 243 10*3/uL (ref 150–400)
RBC: 3.64 MIL/uL — ABNORMAL LOW (ref 4.22–5.81)
RDW: 12.5 % (ref 11.5–15.5)
WBC: 16.6 10*3/uL — ABNORMAL HIGH (ref 4.0–10.5)
nRBC: 0 % (ref 0.0–0.2)

## 2022-06-04 MED ORDER — ONDANSETRON HCL 4 MG/2ML IJ SOLN
4.0000 mg | Freq: Four times a day (QID) | INTRAMUSCULAR | Status: DC | PRN
  Administered 2022-06-04: 4 mg via INTRAVENOUS
  Filled 2022-06-04: qty 2

## 2022-06-04 MED ORDER — IBUPROFEN 200 MG PO TABS
200.0000 mg | ORAL_TABLET | Freq: Once | ORAL | Status: AC
Start: 2022-06-04 — End: 2022-06-04
  Administered 2022-06-04: 200 mg via ORAL
  Filled 2022-06-04: qty 1

## 2022-06-04 MED ORDER — MORPHINE SULFATE (PF) 2 MG/ML IV SOLN
2.0000 mg | INTRAVENOUS | Status: DC | PRN
  Administered 2022-06-04: 2 mg via INTRAVENOUS
  Filled 2022-06-04: qty 1

## 2022-06-04 MED ORDER — POTASSIUM CHLORIDE CRYS ER 20 MEQ PO TBCR
40.0000 meq | EXTENDED_RELEASE_TABLET | Freq: Once | ORAL | Status: AC
Start: 2022-06-04 — End: 2022-06-04
  Administered 2022-06-04: 40 meq via ORAL
  Filled 2022-06-04: qty 2

## 2022-06-04 MED ORDER — SODIUM CHLORIDE 0.9 % IV BOLUS
500.0000 mL | Freq: Once | INTRAVENOUS | Status: AC
  Administered 2022-06-04: 500 mL via INTRAVENOUS

## 2022-06-04 NOTE — Progress Notes (Signed)
Notified on call provider that patient is refusing scheduled Novolog insulin, but will still have CBG checks every four hours.

## 2022-06-04 NOTE — Progress Notes (Signed)
Subjective: Patient feeling better today.  Seems to responded to Toradol and oxybutynin as in his also on tamsulosin for stent discomfort.  Still with fever yesterday up to 102.  White blood cells count still 16,000.  Objective: Vital signs in last 24 hours: Temp:  [98.3 F (36.8 C)-102.9 F (39.4 C)] 99.9 F (37.7 C) (06/10 0401) Pulse Rate:  [79-113] 93 (06/10 0401) Resp:  [14-24] 18 (06/10 0401) BP: (110-149)/(80-95) 126/89 (06/10 0401) SpO2:  [94 %-100 %] 96 % (06/10 0401)  Intake/Output from previous day: 06/09 0701 - 06/10 0700 In: 3607.1 [P.O.:2100; I.V.:707.1; IV Piggyback:800] Out: 3750 [Urine:3750] Intake/Output this shift: No intake/output data recorded.  Physical Exam:  General: Alert and oriented  Lab Results: Recent Labs    06/02/22 0352 06/03/22 0414 06/04/22 0443  HGB 10.9* 13.0 11.2*  HCT 31.8* 37.7* 31.7*   BMET Recent Labs    06/03/22 0414 06/04/22 0443  NA 136 134*  K 3.3* 3.4*  CL 102 101  CO2 25 23  GLUCOSE 117* 129*  BUN 11 9  CREATININE 0.93 1.02  CALCIUM 8.4* 7.9*     Studies/Results: US Abdomen Limited  Result Date: 06/03/2022 CLINICAL DATA:  Abnormal liver function tests EXAM: ULTRASOUND ABDOMEN LIMITED RIGHT UPPER QUADRANT COMPARISON:  None Available. FINDINGS: Gallbladder: Not seen consistent with cholecystectomy. Common bile duct: Diameter: 3.4 mm Liver: There is increased echogenicity in the liver. No focal abnormality is seen in the visualized portions of liver. Portal vein is patent on color Doppler imaging with normal direction of blood flow towards the liver. Other: None. IMPRESSION: Status post cholecystectomy. There is no significant dilation of bile ducts. Fatty liver. Electronically Signed   By: Elmer Picker M.D.   On: 06/03/2022 11:49   VAS [place] LOWER EXTREMITY VENOUS (DVT)  Result Date: 06/02/2022  Lower Venous DVT Study Patient Name:  EVERSON MOTT  Date of Exam:   06/02/2022 Medical Rec #: [id number]   Accession #:     [id number] Date of Birth: [birth year]   Patient Gender: M Patient Age:   61 years Exam Location:  [facility] Procedure:      VAS [place] LOWER EXTREMITY VENOUS (DVT) Referring Phys: Nyoka Lint DOUTOVA --------------------------------------------------------------------------------  Indications: Localized pain - small pain nodule to right ankle. 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  Comparison Study: No previous vascular exams Performing Technologist: Jody Hill RVT, RDMS  Examination Guidelines: A complete evaluation includes B-mode imaging, spectral Doppler, color Doppler, and power Doppler as needed of all accessible portions of each vessel. Bilateral testing is considered an integral part of a complete examination. Limited examinations for reoccurring indications may be performed as noted. The reflux portion of the exam is performed with the patient in reverse Trendelenburg.  +---------+---------------+---------+-----------+----------+--------------+ RIGHT    CompressibilityPhasicitySpontaneityPropertiesThrombus Aging +---------+---------------+---------+-----------+----------+--------------+ CFV      Full           Yes      Yes                                 +---------+---------------+---------+-----------+----------+--------------+ SFJ      Full                                                        +---------+---------------+---------+-----------+----------+--------------+  FV Prox  Full           Yes      Yes                                 +---------+---------------+---------+-----------+----------+--------------+ FV Mid   Full           Yes      Yes                                 +---------+---------------+---------+-----------+----------+--------------+ FV DistalFull           Yes      Yes                                  +---------+---------------+---------+-----------+----------+--------------+ PFV      Full                                                        +---------+---------------+---------+-----------+----------+--------------+ POP      Full           Yes      Yes                                 +---------+---------------+---------+-----------+----------+--------------+ PTV      Full                                                        +---------+---------------+---------+-----------+----------+--------------+ PERO     Full                                                        +---------+---------------+---------+-----------+----------+--------------+ Superficial structure to lateral right ankle in area of pain. Findings correlate to results of soft tissue ultrasound from 06/01/22  +----+---------------+---------+-----------+----------+--------------+ LEFTCompressibilityPhasicitySpontaneityPropertiesThrombus Aging +----+---------------+---------+-----------+----------+--------------+ CFV Full           Yes      Yes                                 +----+---------------+---------+-----------+----------+--------------+    Summary: RIGHT: - There is no evidence of deep vein thrombosis in the lower extremity.  - No cystic structure found in the popliteal fossa.  LEFT: - No evidence of common femoral vein obstruction.  *See table(s) above for measurements and observations. Electronically signed by Monica Martinez MD on 06/02/2022 at 3:05:56 PM.    Final     Assessment/Plan: 1.  Ureteral calculus status post stent with persistent fever, urine culture grew Enterococcus.  Currently on ampicillin Plan/recommendation.  Pain seems fairly well controlled.  I recommended to patient that we keep the Foley catheter until his surgery scheduled for early next week concerning for persistent elevated white count  and fever.  May need to reculture if remains febrile.  No further urologic  intervention at this point.  We will follow.    LOS: 3 days   Remi Haggard 06/04/2022, 8:28 AM

## 2022-06-04 NOTE — Progress Notes (Signed)
[facility]  PROGRESS NOTE  Rickey Cruz KZL:[id number] DOB: 01-11-1961 DOA: 06/01/2022 PCP: Horald Pollen, MD   Brief HPI:   61 year old male with history of hypertension, diabetes mellitus type 2, kidney stones presented with hypotension.  While undergoing preop clearance at surgery center he was found to have blood pressure of 89/67, appeared short of breath and ill-appearing.  Patient has been followed by urology.  He has an obstructing left ureteral stone measuring 5 mm noted on 09 May 2022, s/p ureteral stent placement.  He has been experiencing urinary retention, had a catheter placed and was removed on 5th June. In the ED patient responded to IV fluid challenge, no issue noted to be significant for UTI.  He was started on Rocephin and urology was consulted. CT abdomen/pelvis showed left-sided ureteral stent remain in place.  Previous small stone in the distal ureter adjacent to stent is unchanged.  No hydronephrosis.  Nonobstructing stone in the lower pole of left kidney.  Urinary bladder essentially empty.  Foley catheter has been removed.  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    Subjective   Patient seen and examined, patient has significantly improved after starting oxybutynin and as needed Toradol.  Appreciate urology input.   Assessment/Plan:    Sepsis due to UTI -Patient started on cefepime for possible sepsis due to UTI -He presented with sinus tachycardia, hypotension, responded to IV fluids -Sepsis resolved he has resolved -Urine culture grew Enterococcus, antibiotics changed to ampicillin  Fever -Patient continues to spike fever with temperature 102.9 last night -Repeat blood cultures x2 obtained yesterday -We will repeat urine culture today  Hypertension -Blood pressure is now elevated -Patient started on amlodipine 5 mg daily  Diabetes mellitus type 2 -Continue sliding scale insulin with NovoLog -CBG well  controlled  Dysuria/left ureteral stent in place -Patient still has significant pain on urination -Called and discussed with urologist on-call Dr. McDermiad -Postvoid residual was 683 able.  Foley catheter inserted -Pain has significantly improved after starting Toradol and oxybutynin   Acute kidney injury -Presents with creatinine of 1.84; baseline creatinine 1.15 -Resolved with IV fluids -Today creatinine 0.89  Transaminitis -Unclear etiology -CT abdomen pelvis obtained yesterday was unremarkable -Likely shock liver -Improving; follow LFTs in a.m. -LFTs are rising. 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  Small bowel obstruction -There was a question of SBO on KUB -CT abdomen/pelvis obtained shows no bowel obstruction -Started on regular diet    Medications     amLODipine  5 mg Oral Daily   Chlorhexidine Gluconate Cloth  6 each Topical Daily   docusate sodium  100 mg Oral BID   insulin aspart  0-9 Units Subcutaneous Q4H   oxybutynin  10 mg Oral QHS   polyethylene glycol  17 g Oral Daily   rosuvastatin  10 mg Oral Daily   tamsulosin  0.4 mg Oral QPC supper     Data Reviewed:   CBG:  Recent Labs  Lab 06/03/22 2002 06/03/22 2316 06/04/22 0358 06/04/22 0825 06/04/22 1129  GLUCAP 168* 126* 132* 106* 114*    SpO2: 100 %    Vitals:   06/04/22 0149  06/04/22 0401 06/04/22 1018 06/04/22 1352  BP:  126/89 124/80 123/84  Pulse:  93 85   Resp:  18 (!) 23   Temp: (!) 102.9 F (39.4 C) 99.9 F (37.7 C) 98.8 F (37.1 C) 97.8 F (36.6 C)  TempSrc: Axillary Oral Oral Oral  SpO2:  96% 97% 100%  Weight:       Height:          Data Reviewed:  Basic Metabolic Panel: Recent Labs  Lab 05/30/22 2011 06/01/22 1557 06/02/22 0352 06/03/22 0414 06/04/22 0443  NA 134* 133* 133* 136 134*  K 3.8 3.2* 3.5 3.3* 3.4*  CL 105 97* 101 102 101  CO2 20* '23 22 25 23  '$ GLUCOSE 165* 108* 110* 117* 129*  BUN 12 26* '16 11 9  '$ CREATININE 1.15 1.84* 0.89 0.93 1.02  CALCIUM 8.8* 8.7* 8.0* 8.4* 7.9*  MG  --   --  1.9  --   --   PHOS  --   --  2.4*  --   --     CBC: Recent Labs  Lab 05/30/22 2011 06/01/22 1557 06/02/22 0352 06/03/22 0414 06/04/22 0443  WBC 12.6* 15.0* 10.4 9.8 16.6*  NEUTROABS  --   --  8.5*  --   --   HGB 13.8 13.3 10.9* 13.0 11.2*  HCT 39.3 38.7* 31.8* 37.7* 31.7*  MCV 87.1 89.6 88.6 88.5 87.1  PLT 272 264 206 259 243    LFT Recent Labs  Lab 06/01/22 1557 06/02/22 0352 06/03/22 0414 06/04/22 0443  AST 53* 231* 441* 97*  ALT 54* 169* 360* 196*  ALKPHOS 61 58 109 83  BILITOT 0.8 0.8 1.0 1.0  PROT 7.3 5.7* 6.6 5.6*  ALBUMIN 3.6 2.7* 3.1* 2.5*     Antibiotics: Anti-infectives (From admission, onward)    Start     Dose/Rate Route Frequency Ordered Stop   06/03/22 1200  ampicillin (OMNIPEN) 2 g in sodium chloride 0.9 % 100 mL IVPB        2 g 300 mL/hr over 20 Minutes Intravenous Every 6 hours 06/03/22 0939     06/02/22 1600  ceFEPIme (MAXIPIME) 2 g in sodium chloride 0.9 % 100 mL IVPB  Status:  Discontinued        2 g 200 mL/hr over 30 Minutes Intravenous Every 8 hours 06/02/22 0954 06/03/22 0939   06/01/22 2000  ceFEPIme (MAXIPIME) 2 g in sodium chloride 0.9 % 100 mL IVPB  Status:  Discontinued        2 g 200 mL/hr over 30 Minutes Intravenous Every 12 hours 06/01/22 1855 06/02/22 0954   06/01/22 1645  cefTRIAXone (ROCEPHIN) 2 g in sodium chloride 0.9 % 100 mL IVPB        2 g 200 mL/hr over 30 Minutes Intravenous  Once 06/01/22 1640 06/01/22 1751        DVT prophylaxis: SCDs  Code Status: Full code  Family Communication: No family at bedside   CONSULTS  urology   Objective    Physical Examination:   General-appears in no acute distress Heart-S1-S2, regular, no murmur auscultated Lungs-clear to auscultation bilaterally, no wheezing or crackles auscultated Abdomen-soft, nontender, no organomegaly Extremities-no edema in the lower extremities Neuro-alert, oriented x3, no focal deficit noted    Status is: Inpatient: Hypotension, sepsis        [place]   [facility] If 7PM-7AM, please contact night-coverage at [email or web address], Office  [phone]   06/04/2022, 3:31 PM  LOS: 3 days

## 2022-06-04 NOTE — Progress Notes (Signed)
During the shift, nurse notified a Camera operator, Rapid Response Nurse, and on call provider about patient's elevated temperature, as well as elevated heart rate. At the beginning of the shift, patient was given Tylenol for a temperature of 100.5. Patient's temperature had come down to 98.3. Patient started to have rigors/chills. Rechecked patient's temperature and it was 100.4. Notified on call provider, and on call provider gave new orders for one time dose of Advil. Rechecked patient's temperature close to an hour later and it went up to 102.9. Notified on call provider about the temperature, as well as let Rapid Response Nurse know. On call provider put in new orders. Patient's temperature came down to 99.9.

## 2022-06-04 NOTE — Progress Notes (Signed)
Patient febrile 102.9 blood cultures were sent earlier in the evening, already received tylenol, will give advil. Phillips Climes MD

## 2022-06-04 NOTE — Progress Notes (Incomplete)
[facility]  PROGRESS NOTE  Rickey Cruz HDQ:[id number] DOB: 1961/07/04 DOA: 06/01/2022 PCP: Horald Pollen, MD   Brief HPI:   61 year old male with history of hypertension, diabetes mellitus type 2, kidney stones presented with hypotension.  While undergoing preop clearance at surgery center he was found to have blood pressure of 89/67, appeared short of breath and ill-appearing.  Patient has been followed by urology.  He has an obstructing left ureteral stone measuring 5 mm noted on 09 May 2022, s/p ureteral stent placement.  He has been experiencing urinary retention, had a catheter placed and was removed on 5th June. In the ED patient responded to IV fluid challenge, no issue noted to be significant for UTI.  He was started on Rocephin and urology was consulted. CT abdomen/pelvis showed left-sided ureteral stent remain in place.  Previous small stone in the distal ureter adjacent to stent is unchanged.  No hydronephrosis.  Nonobstructing stone in the lower pole of left kidney.  Urinary bladder essentially empty.  Foley catheter has been removed.  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    Subjective   Patient seen and examined, Foley catheter was placed yesterday as postvoid residual showed 683 mL of urine.   Assessment/Plan:    Sepsis due to UTI -Patient started on cefepime for possible sepsis due to UTI -He presented with sinus tachycardia, hypotension, responded to IV fluids -Sepsis resolved he has resolved -Urine culture grew Enterococcus, antibiotics changed to ampicillin  Hypertension -Blood pressure is now elevated -Patient started amlodipine 5 mg daily  Diabetes mellitus type 2 -Continue sliding scale insulin with NovoLog -CBG well controlled  Dysuria/left ureteral stent in place -Patient still has significant pain on urination -Called and discussed with urologist on-call Dr. McDermiad -Postvoid residual was 683 able.  Foley  catheter inserted  Acute kidney injury -Presents with creatinine of 1.84; baseline creatinine 1.15 -Resolved with IV fluids -Today creatinine 0.89  Transaminitis -Unclear etiology -CT abdomen pelvis obtained yesterday was unremarkable -Likely shock liver -LFTs are rising. 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  Small bowel obstruction -There was a question of SBO on KUB -CT abdomen/pelvis obtained shows no bowel obstruction -Started on regular diet    Medications     amLODipine  5 mg Oral Daily   Chlorhexidine Gluconate Cloth  6 each Topical Daily   docusate sodium  100 mg Oral BID   insulin aspart  0-9 Units Subcutaneous Q4H   oxybutynin  10 mg Oral QHS   polyethylene glycol  17 g Oral Daily   rosuvastatin  10 mg Oral Daily   tamsulosin  0.4 mg Oral QPC supper     Data Reviewed:   CBG:  Recent Labs  Lab 06/03/22 2002 06/03/22 2316 06/04/22 0358 06/04/22 0825 06/04/22 1129  GLUCAP 168* 126* 132* 106* 114*    SpO2: 100 %    Vitals:   06/04/22 0149 06/04/22 0401 06/04/22 1018 06/04/22 1352  BP:  126/89 124/80 123/84  Pulse:  93 85   Resp:  18 (!) 23   Temp: (!) 102.9 F (39.4 C) 99.9 F (37.7 C) 98.8 F (37.1 C) 97.8 F (36.6 C)  TempSrc: Axillary Oral Oral Oral  SpO2:  96% 97% 100%  Weight:      Height:          Data Reviewed:  Basic Metabolic Panel: Recent Labs  Lab 05/30/22 2011 06/01/22 1557 06/02/22 0352 06/03/22 0414 06/04/22 0443  NA 134* 133* 133* 136 134*  K 3.8 3.2* 3.5 3.3* 3.4*  CL 105 97* 101 102 101  CO2 20* '23 22 25 23  '$ GLUCOSE 165* 108* 110* 117* 129*  BUN 12 26* '16 11 9  '$ CREATININE 1.15 1.84* 0.89 0.93  1.02  CALCIUM 8.8* 8.7* 8.0* 8.4* 7.9*  MG  --   --  1.9  --   --   PHOS  --   --  2.4*  --   --     CBC: Recent Labs  Lab 05/30/22 2011 06/01/22 1557 06/02/22 0352 06/03/22 0414 06/04/22 0443  WBC 12.6* 15.0* 10.4 9.8 16.6*  NEUTROABS  --   --  8.5*  --   --   HGB 13.8 13.3 10.9* 13.0 11.2*  HCT 39.3 38.7* 31.8* 37.7* 31.7*  MCV 87.1 89.6 88.6 88.5 87.1  PLT 272 264 206 259 243    LFT Recent Labs  Lab 06/01/22 1557 06/02/22 0352 06/03/22 0414 06/04/22 0443  AST 53* 231* 441* 97*  ALT 54* 169* 360* 196*  ALKPHOS 61 58 109 83  BILITOT 0.8 0.8 1.0 1.0  PROT 7.3 5.7* 6.6 5.6*  ALBUMIN 3.6 2.7* 3.1* 2.5*     Antibiotics: Anti-infectives (From admission, onward)    Start     Dose/Rate Route Frequency Ordered Stop   06/03/22 1200  ampicillin (OMNIPEN) 2 g in sodium chloride 0.9 % 100 mL IVPB        2 g 300 mL/hr over 20 Minutes Intravenous Every 6 hours 06/03/22 0939     06/02/22 1600  ceFEPIme (MAXIPIME) 2 g in sodium chloride 0.9 % 100 mL IVPB  Status:  Discontinued        2 g 200 mL/hr over 30 Minutes Intravenous Every 8 hours 06/02/22 0954 06/03/22 0939   06/01/22 2000  ceFEPIme (MAXIPIME) 2 g in sodium chloride 0.9 % 100 mL IVPB  Status:  Discontinued        2 g 200 mL/hr over 30 Minutes Intravenous Every 12 hours 06/01/22 1855 06/02/22 0954   06/01/22 1645  cefTRIAXone (ROCEPHIN) 2 g in sodium chloride 0.9 % 100 mL IVPB        2 g 200 mL/hr over 30 Minutes Intravenous  Once 06/01/22 1640 06/01/22 1751        DVT prophylaxis: SCDs  Code Status: Full code  Family Communication: No family at bedside   CONSULTS urology   Objective    Physical Examination:      Status is: Inpatient: Hypotension, sepsis        [place]   [facility] If 7PM-7AM, please contact night-coverage at [email or web address], Office  [phone]   06/04/2022, 3:25 PM  LOS: 3 days

## 2022-06-05 DIAGNOSIS — I10 Essential (primary) hypertension: Secondary | ICD-10-CM | POA: Diagnosis not present

## 2022-06-05 DIAGNOSIS — A419 Sepsis, unspecified organism: Secondary | ICD-10-CM | POA: Diagnosis not present

## 2022-06-05 DIAGNOSIS — E1122 Type 2 diabetes mellitus with diabetic chronic kidney disease: Secondary | ICD-10-CM | POA: Diagnosis not present

## 2022-06-05 DIAGNOSIS — N179 Acute kidney failure, unspecified: Secondary | ICD-10-CM | POA: Diagnosis not present

## 2022-06-05 LAB — URINE CULTURE: Culture: NO GROWTH

## 2022-06-05 LAB — CBC
HCT: 34.8 % — ABNORMAL LOW (ref 39.0–52.0)
Hemoglobin: 11.7 g/dL — ABNORMAL LOW (ref 13.0–17.0)
MCH: 30.4 pg (ref 26.0–34.0)
MCHC: 33.6 g/dL (ref 30.0–36.0)
MCV: 90.4 fL (ref 80.0–100.0)
Platelets: 303 10*3/uL (ref 150–400)
RBC: 3.85 MIL/uL — ABNORMAL LOW (ref 4.22–5.81)
RDW: 12.6 % (ref 11.5–15.5)
WBC: 17.3 10*3/uL — ABNORMAL HIGH (ref 4.0–10.5)
nRBC: 0 % (ref 0.0–0.2)

## 2022-06-05 LAB — GLUCOSE, CAPILLARY
Glucose-Capillary: 104 mg/dL — ABNORMAL HIGH (ref 70–99)
Glucose-Capillary: 106 mg/dL — ABNORMAL HIGH (ref 70–99)
Glucose-Capillary: 114 mg/dL — ABNORMAL HIGH (ref 70–99)
Glucose-Capillary: 136 mg/dL — ABNORMAL HIGH (ref 70–99)
Glucose-Capillary: 146 mg/dL — ABNORMAL HIGH (ref 70–99)
Glucose-Capillary: 152 mg/dL — ABNORMAL HIGH (ref 70–99)
Glucose-Capillary: 159 mg/dL — ABNORMAL HIGH (ref 70–99)

## 2022-06-05 LAB — COMPREHENSIVE METABOLIC PANEL
ALT: 170 U/L — ABNORMAL HIGH (ref 0–44)
AST: 104 U/L — ABNORMAL HIGH (ref 15–41)
Albumin: 2.6 g/dL — ABNORMAL LOW (ref 3.5–5.0)
Alkaline Phosphatase: 103 U/L (ref 38–126)
Anion gap: 10 (ref 5–15)
BUN: 10 mg/dL (ref 6–20)
CO2: 25 mmol/L (ref 22–32)
Calcium: 8.1 mg/dL — ABNORMAL LOW (ref 8.9–10.3)
Chloride: 103 mmol/L (ref 98–111)
Creatinine, Ser: 1.05 mg/dL (ref 0.61–1.24)
GFR, Estimated: >60 mL/min (ref >60)
Glucose, Bld: 115 mg/dL — ABNORMAL HIGH (ref 70–99)
Potassium: 4.1 mmol/L (ref 3.5–5.1)
Sodium: 138 mmol/L (ref 135–145)
Total Bilirubin: 0.7 mg/dL (ref 0.3–1.2)
Total Protein: 6.4 g/dL — ABNORMAL LOW (ref 6.5–8.1)

## 2022-06-05 MED ORDER — BISACODYL 10 MG RE SUPP
10.0000 mg | Freq: Once | RECTAL | Status: AC
  Administered 2022-06-05: 10 mg via RECTAL
  Filled 2022-06-05: qty 1

## 2022-06-05 NOTE — Progress Notes (Signed)
[facility]  PROGRESS NOTE  Rickey Cruz LKG:[id number] DOB: 07/11/1961 DOA: 06/01/2022 PCP: Horald Pollen, MD   Brief HPI:   61 year old male with history of hypertension, diabetes mellitus type 2, kidney stones presented with hypotension.  While undergoing preop clearance at surgery center he was found to have blood pressure of 89/67, appeared short of breath and ill-appearing.  Patient has been followed by urology.  He has an obstructing left ureteral stone measuring 5 mm noted on 09 May 2022, s/p ureteral stent placement.  He has been experiencing urinary retention, had a catheter placed and was removed on 5th June. In the ED patient responded to IV fluid challenge, no issue noted to be significant for UTI.  He was started on Rocephin and urology was consulted. CT abdomen/pelvis showed left-sided ureteral stent remain in place.  Previous small stone in the distal ureter adjacent to stent is unchanged.  No hydronephrosis.  Nonobstructing stone in the lower pole of left kidney.  Urinary bladder essentially empty.  Foley catheter has been removed.  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    Subjective   Patient seen and examined, still spiking temp.  102 F last night.  WBC still elevated 17,000.  Urine culture showed no growth.  Blood cultures x2 obtained on 06/01/2022 as well as 06/03/2022 are negative to date.  Pain is much better controlled.   Assessment/Plan:    Sepsis due to UTI -Patient started on cefepime for possible sepsis due to UTI -He presented with sinus tachycardia, hypotension, responded to IV fluids -Sepsis resolved he has resolved -Urine culture grew Enterococcus, antibiotics changed to ampicillin -WBC elevated at 17,000  Fever -Patient continues to spike fever with temperature 102.9 last night -Repeat blood cultures x2 obtained yesterday are negative to date -Urine culture showed no growth -We will discharge when patient is  afebrile for 24 hours  Hypertension -Blood pressure is now elevated -Patient started on amlodipine 5 mg daily  Diabetes mellitus type 2 -Continue sliding scale insulin with NovoLog -CBG well controlled  Dysuria/left ureteral stent in place -Patient still has significant pain on urination -Called and discussed with urologist on-call Dr. McDermiad -Postvoid residual was 683 able.  Foley catheter inserted -Pain has significantly improved after starting Toradol and oxybutynin   Acute kidney injury -Presents with creatinine of 1.84; baseline creatinine 1.15 -Resolved with IV fluids -Today creatinine 0.89  Transaminitis -Unclear etiology -CT abdomen pelvis obtained yesterday was unremarkable -Likely shock liver -Improving; follow LFTs in a.m. -LFTs are rising. 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  Small bowel obstruction -There was a question of SBO on KUB -CT abdomen/pelvis obtained shows no bowel obstruction -Started on regular diet    Medications     amLODipine  5 mg Oral Daily   Chlorhexidine Gluconate Cloth  6 each Topical Daily   docusate sodium  100 mg Oral BID   insulin aspart  0-9 Units Subcutaneous Q4H   oxybutynin  10 mg Oral QHS   polyethylene glycol  17 g Oral Daily   tamsulosin  0.4 mg Oral QPC supper     Data Reviewed:   CBG:  Recent Labs  Lab 06/04/22 1941 06/05/22 0007 06/05/22  0405 06/05/22 0750 06/05/22 1158  GLUCAP 92 136* 114* 104* 106*    SpO2: 97 %    Vitals:   06/05/22 0134 06/05/22 1050 06/05/22 1200 06/05/22 1241  BP:    124/82  Pulse:    63  Resp:    16  Temp: 98.3 F (36.8 C) 99.7 F (37.6 C) 98.5 F (36.9 C)  98.4 F (36.9 C)  TempSrc:    Oral  SpO2:    97%  Weight:      Height:          Data Reviewed:  Basic Metabolic Panel: Recent Labs  Lab 06/01/22 1557 06/02/22 0352 06/03/22 0414 06/04/22 0443 06/05/22 0414  NA 133* 133* 136 134* 138  K 3.2* 3.5 3.3* 3.4* 4.1  CL 97* 101 102 101 103  CO2 '23 22 25 23 25  '$ GLUCOSE 108* 110* 117* 129* 115*  BUN 26* '16 11 9 10  '$ CREATININE 1.84* 0.89 0.93 1.02 1.05  CALCIUM 8.7* 8.0* 8.4* 7.9* 8.1*  MG  --  1.9  --   --   --   PHOS  --  2.4*  --   --   --     CBC: Recent Labs  Lab 06/01/22 1557 06/02/22 0352 06/03/22 0414 06/04/22 0443 06/05/22 0414  WBC 15.0* 10.4 9.8 16.6* 17.3*  NEUTROABS  --  8.5*  --   --   --   HGB 13.3 10.9* 13.0 11.2* 11.7*  HCT 38.7* 31.8* 37.7* 31.7* 34.8*  MCV 89.6 88.6 88.5 87.1 90.4  PLT 264 206 259 243 303    LFT Recent Labs  Lab 06/01/22 1557 06/02/22 0352 06/03/22 0414 06/04/22 0443 06/05/22 0414  AST 53* 231* 441* 97* 104*  ALT 54* 169* 360* 196* 170*  ALKPHOS 61 58 109 83 103  BILITOT 0.8 0.8 1.0 1.0 0.7  PROT 7.3 5.7* 6.6 5.6* 6.4*  ALBUMIN 3.6 2.7* 3.1* 2.5* 2.6*     Antibiotics: Anti-infectives (From admission, onward)    Start     Dose/Rate Route Frequency Ordered Stop   06/03/22 1200  ampicillin (OMNIPEN) 2 g in sodium chloride 0.9 % 100 mL IVPB        2 g 300 mL/hr over 20 Minutes Intravenous Every 6 hours 06/03/22 0939     06/02/22 1600  ceFEPIme (MAXIPIME) 2 g in sodium chloride 0.9 % 100 mL IVPB  Status:  Discontinued        2 g 200 mL/hr over 30 Minutes Intravenous Every 8 hours 06/02/22 0954 06/03/22 0939   06/01/22 2000  ceFEPIme (MAXIPIME) 2 g in sodium chloride 0.9 % 100 mL IVPB  Status:  Discontinued        2 g 200 mL/hr over 30 Minutes Intravenous Every 12 hours 06/01/22 1855 06/02/22 0954   06/01/22 1645  cefTRIAXone (ROCEPHIN) 2 g in sodium chloride 0.9 % 100 mL IVPB        2 g 200 mL/hr over 30 Minutes Intravenous  Once 06/01/22 1640 06/01/22 1751         DVT prophylaxis: SCDs  Code Status: Full code  Family Communication: No family at bedside   CONSULTS urology   Objective    Physical Examination:   General-appears in no acute distress Heart-S1-S2, regular, no murmur auscultated Lungs-clear to auscultation bilaterally, no wheezing or crackles auscultated Abdomen-soft, nontender, no organomegaly Extremities-no edema in the lower extremities Neuro-alert, oriented x3, no focal deficit noted    Status is: Inpatient: Hypotension, sepsis  Oswald Hillock   [facility] If 7PM-7AM, please contact night-coverage at [email or web address], Office  [phone]   06/05/2022, 2:13 PM  LOS: 4 days

## 2022-06-05 NOTE — Progress Notes (Signed)
  Subjective: Patient continues to have some left-sided flank pain as expected due to the indwelling stent.  Overall doing fairly well.  Foley is draining pink urine without clots.  Afebrile last 24 hours.  Liver function tests remain elevated as does white blood cell count still at 17.  No clear-cut etiology for his elevated liver function tests, questionable antibiotic related versus sepsis  Objective: Vital signs in last 24 hours: Temp:  [97.8 F (36.6 C)-100.2 F (37.9 C)] 98.3 F (36.8 C) (06/11 0134) Pulse Rate:  [85-110] 110 (06/10 2306) Resp:  [18-23] 19 (06/10 2306) BP: (101-124)/(75-86) 101/75 (06/10 2306) SpO2:  [97 %-100 %] 97 % (06/10 2306)  Intake/Output from previous day: 06/10 0701 - 06/11 0700 In: 2330.5 [P.O.:480; I.V.:1450.5; IV Piggyback:400] Out: 3200 [Urine:3200] Intake/Output this shift: Total I/O In: 854.7 [I.V.:754.7; IV Piggyback:100] Out: -   Physical Exam:  General: Alert and oriented C  Lab Results: Recent Labs    06/03/22 0414 06/04/22 0443 06/05/22 0414  HGB 13.0 11.2* 11.7*  HCT 37.7* 31.7* 34.8*   BMET Recent Labs    06/04/22 0443 06/05/22 0414  NA 134* 138  K 3.4* 4.1  CL 101 103  CO2 23 25  GLUCOSE 129* 115*  BUN 9 10  CREATININE 1.02 1.05  CALCIUM 7.9* 8.1*     Studies/Results: US Abdomen Limited  Result Date: 06/03/2022 CLINICAL DATA:  Abnormal liver function tests EXAM: ULTRASOUND ABDOMEN LIMITED RIGHT UPPER QUADRANT COMPARISON:  None Available. FINDINGS: Gallbladder: Not seen consistent with cholecystectomy. Common bile duct: Diameter: 3.4 mm Liver: There is increased echogenicity in the liver. No focal abnormality is seen in the visualized portions of liver. Portal vein is patent on color Doppler imaging with normal direction of blood flow towards the liver. Other: None. IMPRESSION: Status post cholecystectomy. There is no significant dilation of bile ducts. Fatty liver. Electronically Signed   By: Elmer Picker M.D.    On: 06/03/2022 11:49    Assessment/Plan: Left ureteral calculus status post stent patient better pain management and remains with elevated white count and unexplained elevation of liver function test. From GU standpoint is probably pain managed well enough to be able to go home but due to elevated liver function tests will defer to hospitalist whether or not he could go home versus keeping him in the hospital until his definitive procedure by Dr. Claudia Desanctis for his ureteral calculus.    LOS: 4 days   Remi Haggard 06/05/2022, 8:15 AM

## 2022-06-05 NOTE — Progress Notes (Signed)
Pt complaining of pain and pressure to bladder.  Says it feels like he needs to urinate.  Nurse and pt saw clots in urine earlier in day.  Pt urine output stopped around 1800.  MD notified.  Hand irrigated with 60 cc of saline.  500cc output after irrigation.  A few clots noted.   Advised to contact on call urology if it occurs again.    Will continue to monitor.

## 2022-06-05 NOTE — Plan of Care (Signed)

## 2022-06-06 LAB — CBC
HCT: 33.9 % — ABNORMAL LOW (ref 39.0–52.0)
Hemoglobin: 11.4 g/dL — ABNORMAL LOW (ref 13.0–17.0)
MCH: 30.2 pg (ref 26.0–34.0)
MCHC: 33.6 g/dL (ref 30.0–36.0)
MCV: 89.7 fL (ref 80.0–100.0)
Platelets: 313 10*3/uL (ref 150–400)
RBC: 3.78 MIL/uL — ABNORMAL LOW (ref 4.22–5.81)
RDW: 12.8 % (ref 11.5–15.5)
WBC: 10.3 10*3/uL (ref 4.0–10.5)
nRBC: 0 % (ref 0.0–0.2)

## 2022-06-06 LAB — COMPREHENSIVE METABOLIC PANEL
ALT: 212 U/L — ABNORMAL HIGH (ref 0–44)
AST: 221 U/L — ABNORMAL HIGH (ref 15–41)
Albumin: 2.3 g/dL — ABNORMAL LOW (ref 3.5–5.0)
Alkaline Phosphatase: 91 U/L (ref 38–126)
Anion gap: 9 (ref 5–15)
BUN: 13 mg/dL (ref 6–20)
CO2: 24 mmol/L (ref 22–32)
Calcium: 7.9 mg/dL — ABNORMAL LOW (ref 8.9–10.3)
Chloride: 107 mmol/L (ref 98–111)
Creatinine, Ser: 0.89 mg/dL (ref 0.61–1.24)
GFR, Estimated: >60 mL/min (ref >60)
Glucose, Bld: 113 mg/dL — ABNORMAL HIGH (ref 70–99)
Potassium: 3.5 mmol/L (ref 3.5–5.1)
Sodium: 140 mmol/L (ref 135–145)
Total Bilirubin: 0.7 mg/dL (ref 0.3–1.2)
Total Protein: 5.8 g/dL — ABNORMAL LOW (ref 6.5–8.1)

## 2022-06-06 LAB — GLUCOSE, CAPILLARY
Glucose-Capillary: 112 mg/dL — ABNORMAL HIGH (ref 70–99)
Glucose-Capillary: 110 mg/dL — ABNORMAL HIGH (ref 70–99)
Glucose-Capillary: 166 mg/dL — ABNORMAL HIGH (ref 70–99)

## 2022-06-06 LAB — CULTURE, BLOOD (ROUTINE X 2)
Culture: NO GROWTH
Special Requests: ADEQUATE

## 2022-06-06 MED ORDER — AMOXICILLIN 500 MG PO CAPS: 500.0000 mg | ORAL_CAPSULE | Freq: Three times a day (TID) | ORAL | 0 refills | Status: DC

## 2022-06-06 MED ORDER — LIP MEDEX EX OINT: TOPICAL_OINTMENT | CUTANEOUS | Status: DC | PRN

## 2022-06-06 NOTE — Progress Notes (Addendum)
COVID Vaccine Completed: Yes x 2   Date of COVID positive in last 90 days: no   PCP - Horald Pollen, MD Cardiologist - n/a   Chest x-ray - 04/28/22 Epic EKG - 06-02-22 Epic Stress Test - n/a ECHO - n/a Cardiac Cath - n/a Pacemaker/ICD device last checked: n/a Spinal Cord Stimulator: n/a   Bowel Prep - clear liquid day before   Sleep Study - yes CPAP - no, surgical intervention   Fasting Blood Sugar - no checks at home, diet controlled Checks Blood Sugar  times a day   Blood Thinner Instructions: n/a Aspirin Instructions: Last Dose:   Activity level: Can go up a flight of stairs and perform activities of daily living without stopping and without symptoms of chest pain or shortness of breath.                Anesthesia review:    Patient denies shortness of breath, fever, cough and chest pain at PAT appointment   Patient verbalized understanding of instructions that were given to them at the PAT appointment. Patient was also instructed that they will need to review over the PAT instructions again at home before surgery.

## 2022-06-06 NOTE — Discharge Summary (Signed)
Physician Discharge Summary   Patient: Rickey Cruz MRN: [id number] DOB: 1961/05/12  Admit date:     06/01/2022  Discharge date: 06/06/22  Discharge Physician: Oswald Hillock   PCP: Horald Pollen, MD   Recommendations at discharge:   Patient to go home with Foley catheter Patient has surgery scheduled on 06/07/2022 at outpatient surgery center Follow-up PCP in 2 weeks to check liver function tests  Discharge Diagnoses: Principal Problem:   Sepsis ([place]) Active Problems:   SBO (small bowel obstruction) ([place])   Essential hypertension   HLD (hyperlipidemia)   History of kidney stones   AKI (acute kidney injury) ([place])   Hypokalemia   Right ankle pain   DM2 (diabetes mellitus, type 2) ([place])   Constipation   UTI (urinary tract infection)  Resolved Problems:   * No resolved hospital problems. *  Hospital Course:  61 year old male with history of hypertension, diabetes mellitus type 2, kidney stones presented with hypotension.  While undergoing preop clearance at surgery center he was found to have blood pressure of 89/67, appeared short of breath and ill-appearing.  Patient has been followed by urology.  He has an obstructing left ureteral stone measuring 5 mm noted on 09 May 2022, s/p ureteral stent placement.  He has been experiencing urinary retention, had a catheter placed and was removed on 5th June. In the ED patient responded to IV fluid challenge, no issue noted to be significant for UTI.  He was started on Rocephin and urology was consulted. CT abdomen/pelvis showed left-sided ureteral stent remain in place.  Previous small stone in the distal ureter adjacent to stent is unchanged.  No hydronephrosis.  Nonobstructing stone in the lower pole of left kidney.  Urinary bladder essentially empty.  Foley catheter has been removed.  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   Assessment and Plan:  Sepsis due to UTI -Patient started on cefepime for  possible sepsis due to UTI -He presented with sinus tachycardia, hypotension, responded to IV fluids -Sepsis resolved  -Urine culture grew Enterococcus, antibiotics changed to ampicillin -WBC now down to 10,000 -We will discharge on amoxicillin 500 mg p.o. 3 times daily for 5 more days to complete 10 days of treatment   Fever -Patient continued to spike fever with temperature Tmax 102.9 -Repeat blood cultures x2 obtained  are negative to date -Urine culture showed no growth -Patient had fever of 101 this morning, however he feels much better.  WBC is normal. -Patient to be discharged on amoxicillin as above.   Hypertension -Blood pressure has been stable -Patient started on amlodipine 5 mg daily -Discontinue lisinopril as patient was hypotensive and he presented to the ED   Diabetes mellitus type 2 -Hemoglobin A1c 6.3   Dysuria/left ureteral stent in place -Patient presented with  significant pain on urination -Called and discussed with urologist on-call Dr. McDermiad -Postvoid residual was 683 able.  Foley catheter inserted -Pain has significantly improved after starting Toradol and oxybutynin -Patient has surgery scheduled with Dr. Claudia Desanctis as outpatient on 06/07/2022     Acute kidney injury -Presents with creatinine of 1.84; baseline creatinine 1.15 -Resolved with IV fluids -creatinine 0.89   Transaminitis -During hospitalization patient's AST and ALT were elevated, 441/360 and are slowly improving -Unclear etiology, likely in the setting of sepsis -CT abdomen pelvis obtained  was unremarkable -Obtained abdominal ultrasound, which showed mild fatty liver -Hepatitis panel is negative for hepatitis A, B, and C -Need to follow-up with PCP in  2 weeks to check LFTs as outpatient -We will discontinue Crestor till LFTs are improved    Hypokalemia -Replete     Right ankle painful nodule -Soft tissue ultrasound showed 1.4 cm superficial mass in the right ankle  -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  Small bowel obstruction -There was a question of SBO on KUB -CT abdomen/pelvis obtained shows no bowel obstruction -Started on regular diet             Consultants: Urology Procedures performed:  Disposition: Home Diet recommendation:  Discharge Diet Orders (From admission, onward)     Start     Ordered   06/06/22 0000  Diet - low sodium heart healthy        06/06/22 1216           Regular diet DISCHARGE MEDICATION: Allergies as of 06/06/2022   No Known Allergies      Medication List     STOP taking these medications    Cephalexin 500 MG tablet   lisinopril 40 MG tablet Commonly known as: ZESTRIL   rosuvastatin 10 MG tablet Commonly known as: CRESTOR       TAKE these medications    albuterol 108 (90 Base) MCG/ACT inhaler Commonly known as: VENTOLIN HFA INHALE 2 PUFFS INTO THE LUNGS EVERY 4 HOURS AS NEEDED FOR WHEEZING OR SHORTNESS OF BREATH(COUGHING FITS) What changed: See the new instructions.   amLODipine 5 MG tablet Commonly known as: NORVASC TAKE 1 TABLET(5 MG) BY MOUTH DAILY What changed: See the new instructions.   amoxicillin 500 MG capsule Commonly known as: AMOXIL Take 1 capsule (500 mg total) by mouth 3 (three) times daily.   Breztri Aerosphere 160-9-4.8 MCG/ACT Aero Generic drug: Budeson-Glycopyrrol-Formoterol Inhale two puffs with spacer twice daily to prevent cough or wheeze.  Rinse, gargle, and spit after use.   clobetasol ointment 0.05 % Commonly known as: TEMOVATE Apply 1 application. topically 2 (two) times daily as needed (psoriasis).   finasteride 5 MG tablet Commonly known as: PROSCAR Take 1 tablet (5 mg total) by mouth daily.   ketorolac 10 MG tablet Commonly known as: TORADOL Take 10 mg by mouth every 8 (eight) hours as needed for moderate pain or severe pain.   ondansetron 4 MG disintegrating tablet Commonly known as:  ZOFRAN-ODT '4mg'$  ODT q4 hours prn nausea/vomit What changed:  how much to take how to take this when to take this reasons to take this additional instructions   oxybutynin 5 MG tablet Commonly known as: DITROPAN Take 1 tablet (5 mg total) by mouth every 8 (eight) hours as needed for bladder spasms.   oxyCODONE-acetaminophen 5-325 MG tablet Commonly known as: PERCOCET/ROXICET Take 1 tablet by mouth every 6 (six) hours as needed for severe pain.   tamsulosin 0.4 MG Caps capsule Commonly known as: FLOMAX Take 1 capsule (0.4 mg total) by mouth at bedtime.        Follow-up Information     Horald Pollen, MD Follow up in 2 week(s).   Specialty: Internal Medicine Why: Check liver function tests in 2 weeks at PCP office Contact information: [place] [id number] [phone]                Discharge Exam: Filed Weights   06/01/22 1414  Weight: 82.6 kg   General-appears in no acute distress Heart-S1-S2, regular, no murmur auscultated Lungs-clear to auscultation bilaterally, no wheezing or crackles auscultated Abdomen-soft, nontender, no  organomegaly Extremities-no edema in the lower extremities Neuro-alert, oriented x3, no focal deficit noted  Condition at discharge: stable  The results of significant diagnostics from this hospitalization (including imaging, microbiology, ancillary and laboratory) are listed below for reference.   Imaging Studies: US Abdomen Limited  Result Date: 06/03/2022 CLINICAL DATA:  Abnormal liver function tests EXAM: ULTRASOUND ABDOMEN LIMITED RIGHT UPPER QUADRANT COMPARISON:  None Available. FINDINGS: Gallbladder: Not seen consistent with cholecystectomy. Common bile duct: Diameter: 3.4 mm Liver: There is increased echogenicity in the liver. No focal abnormality is seen in the visualized portions of liver. Portal vein is patent on color Doppler imaging with normal direction of blood flow towards the liver. Other: None.  IMPRESSION: Status post cholecystectomy. There is no significant dilation of bile ducts. Fatty liver. Electronically Signed   By: Elmer Picker M.D.   On: 06/03/2022 11:49   VAS [place] LOWER EXTREMITY VENOUS (DVT)  Result Date: 06/02/2022  Lower Venous DVT Study Patient Name:  MONNIE ANSPACH  Date of Exam:   06/02/2022 Medical Rec #: [id number]   Accession #:    [id number] Date of Birth: 1961/09/04   Patient Gender: M Patient Age:   73 years Exam Location:  [facility] Procedure:      VAS [place] LOWER EXTREMITY VENOUS (DVT) Referring Phys: Nyoka Lint DOUTOVA --------------------------------------------------------------------------------  Indications: Localized pain - small pain nodule to right ankle. 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  Comparison Study: No previous vascular exams Performing Technologist: Jody Hill RVT, RDMS  Examination Guidelines: A complete evaluation includes B-mode imaging, spectral Doppler, color Doppler, and power Doppler as needed of all accessible portions of each vessel. Bilateral testing is considered an integral part of a complete examination. Limited examinations for reoccurring indications may be performed as noted. The reflux portion of the exam is performed with the patient in reverse Trendelenburg.  +---------+---------------+---------+-----------+----------+--------------+ RIGHT    CompressibilityPhasicitySpontaneityPropertiesThrombus Aging +---------+---------------+---------+-----------+----------+--------------+ CFV      Full           Yes      Yes                                 +---------+---------------+---------+-----------+----------+--------------+ SFJ      Full                                                        +---------+---------------+---------+-----------+----------+--------------+ FV Prox  Full           Yes      Yes                                  +---------+---------------+---------+-----------+----------+--------------+ FV Mid   Full           Yes      Yes                                 +---------+---------------+---------+-----------+----------+--------------+ FV DistalFull           Yes      Yes                                 +---------+---------------+---------+-----------+----------+--------------+  PFV      Full                                                        +---------+---------------+---------+-----------+----------+--------------+ POP      Full           Yes      Yes                                 +---------+---------------+---------+-----------+----------+--------------+ PTV      Full                                                        +---------+---------------+---------+-----------+----------+--------------+ PERO     Full                                                        +---------+---------------+---------+-----------+----------+--------------+ Superficial structure to lateral right ankle in area of pain. Findings correlate to results of soft tissue ultrasound from 06/01/22  +----+---------------+---------+-----------+----------+--------------+ LEFTCompressibilityPhasicitySpontaneityPropertiesThrombus Aging +----+---------------+---------+-----------+----------+--------------+ CFV Full           Yes      Yes                                 +----+---------------+---------+-----------+----------+--------------+    Summary: RIGHT: - There is no evidence of deep vein thrombosis in the lower extremity.  - No cystic structure found in the popliteal fossa.  LEFT: - No evidence of common femoral vein obstruction.  *See table(s) above for measurements and observations. Electronically signed by Monica Martinez MD on 06/02/2022 at 3:05:56 PM.    Final    [place] RT LOWER EXTREM LTD SOFT TISSUE NON VASCULAR  Result Date: 06/02/2022 CLINICAL DATA:  Painful right ankle nodule. EXAM:  ULTRASOUND RIGHT LOWER EXTREMITY LIMITED TECHNIQUE: Ultrasound examination of the lower extremity soft tissues was performed in the area of clinical concern. COMPARISON:  None Available. FINDINGS: Focused ultrasound of the lateral right ankle at the site of the palpable abnormality demonstrates a superficial round well-defined heterogeneously hypoechoic mass with distinct margins and no internal vascularity. The mass measures 1.4 x 0.7 x 1.2 cm. There appears to be splitting of the fat planes at the edge of the mass, best appreciated on the cine images. IMPRESSION: 1. 1.4 cm superficial mass in the lateral right ankle at the site of palpable abnormality. 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 An epidermal inclusion cyst could have a similar appearance. Electronically Signed   By: Titus Dubin M.D.   On: 06/02/2022 08:15   CT ABDOMEN PELVIS WO CONTRAST  Result Date: 06/02/2022 CLINICAL DATA:  Abdominal pain.  Concern for bowel obstruction. EXAM: CT ABDOMEN AND PELVIS WITHOUT CONTRAST TECHNIQUE: Multidetector CT imaging of the abdomen and pelvis was performed following the standard protocol without IV contrast. RADIATION DOSE REDUCTION: This exam was performed according to the  departmental dose-optimization program which includes automated exposure control, adjustment of the mA and/or kV according to patient size and/or use of iterative reconstruction technique. COMPARISON:  CT abdomen pelvis dated 05/30/2022. FINDINGS: Evaluation of this exam is limited in the absence of intravenous contrast. Lower chest: The visualized lung bases are clear. There is coronary vascular calcification. No intra-abdominal free air or free fluid. Hepatobiliary: There is unremarkable. No intrahepatic biliary ductal dilatation. Cholecystectomy. No retained calcified stone noted in the central CBD. Pancreas: Unremarkable. No pancreatic ductal dilatation or surrounding inflammatory changes.  Spleen: Normal in size without focal abnormality. Adrenals/Urinary Tract: The adrenal glands unremarkable. Mild left hydronephrosis, new or increased since the prior CT. Similar positioning of the left ureteral stent. Several small nonobstructing left renal inferior pole calculi. A 3 mm stone in the distal left ureter along the stent in similar location as the prior CT. There is no hydronephrosis or nephrolithiasis on right. The right ureter and urinary bladder appear unremarkable. Stomach/Bowel: Oral contrast opacifies the stomach and multiple loops of small bowel. There is no bowel obstruction or active inflammation. Appendectomy. Vascular/Lymphatic: Mild aortoiliac atherosclerotic disease. The IVC is grossly unremarkable. No portal venous gas. There is no adenopathy. Reproductive: Enlarged prostate gland measuring 5.3 cm in transverse axial diameter. There is lobulated median lobe hypertrophy indenting and protruding through the base of the bladder. Other: None Musculoskeletal: No acute or significant osseous findings. IMPRESSION: 1. No bowel obstruction. 2. Mild left hydronephrosis, new or increased since the prior CT. Similar positioning of the left ureteral stent. A 3 mm stone in the distal left ureter along the stent in similar location as the prior CT. Several small nonobstructing left renal inferior pole calculi. 3. Enlarged prostate gland with lobulated median lobe hypertrophy indenting and protruding through the base of the bladder. 4. Aortic Atherosclerosis (ICD10-I70.0). Electronically Signed   By: Anner Crete M.D.   On: 06/02/2022 02:44   DG Abd 1 View  Result Date: 06/01/2022 CLINICAL DATA:  Preoperative. EXAM: ABDOMEN - 1 VIEW COMPARISON:  Abdominal x-ray 05/26/2022. CT abdomen and pelvis 05/30/2022. FINDINGS: Left-sided ureteral stent appears unchanged in position. There surgical clips in the right abdomen, unchanged from the prior examination. There are new dilated small bowel loops  measuring up to 3.8 cm throughout the mid and upper abdomen. There is air seen throughout nondilated colon to the level of the rectum. No acute fractures are seen. There are phleboliths in the left hemipelvis. IMPRESSION: 1. New dilated mid and proximal small bowel concerning for small bowel obstruction. 2. Left ureteral stent is unchanged in position. Electronically Signed   By: Ronney Asters M.D.   On: 06/01/2022 21:11   CT Renal Stone Study  Result Date: 05/30/2022 CLINICAL DATA:  Flank pain, kidney stone suspected Patient reports catheter removed this morning, has not yet herniated since that time. EXAM: CT ABDOMEN AND PELVIS WITHOUT CONTRAST TECHNIQUE: Multidetector CT imaging of the abdomen and pelvis was performed following the standard protocol without IV contrast. RADIATION DOSE REDUCTION: This exam was performed according to the departmental dose-optimization program which includes automated exposure control, adjustment of the mA and/or kV according to patient size and/or use of iterative reconstruction technique. COMPARISON:  CT 2 days ago 05/28/2022 FINDINGS: Lower chest: Clear lung bases.  No acute findings. Hepatobiliary: Borderline hepatic steatosis. No focal liver abnormality. Clips in the gallbladder fossa postcholecystectomy. No biliary dilatation. Pancreas: No ductal dilatation or inflammation. Spleen: Normal in size without focal abnormality. Adrenals/Urinary Tract: No adrenal nodule. Left-sided ureteral  stent remains in place. Proximal pigtail in the renal pelvis, distal pigtail in the bladder. There is no hydronephrosis. Previous small stone in the left distal ureter adjacent to the stent series 2, image 67, without significant interval change. Nonobstructing stone in the lower pole of the left kidney. Small low-density lesion in the left upper pole is likely a cyst, but incompletely characterized due to size and lack of the IV contrast. No right hydronephrosis, renal calculi or perinephric  edema. Urinary bladder is essentially empty, and not well assessed. Foley catheter has been removed. Stomach/Bowel: Stomach is within normal limits. Appendectomy. No evidence of bowel wall thickening, distention, or inflammatory changes. Vascular/Lymphatic: Mild aortic atherosclerosis. No aortic aneurysm. No portal venous or mesenteric gas. No abdominopelvic adenopathy. Reproductive: Markedly enlarged prostate gland causing mass effect on the bladder base. Other: No free air, free fluid, or intra-abdominal fluid collection. Musculoskeletal: No free air, free fluid, or intra-abdominal fluid collection. IMPRESSION: 1. Left-sided ureteral stent remains in place. Previous small stone in the left distal ureter adjacent to the stent is unchanged. No hydronephrosis. 2. Nonobstructing stone in the lower pole of the left kidney. 3. Urinary bladder is essentially empty, the Foley catheter has been removed. 4. Markedly enlarged prostate gland causing mass effect on the bladder base. Aortic Atherosclerosis (ICD10-I70.0). Electronically Signed   By: Keith Rake M.D.   On: 05/30/2022 20:51   CT Renal Stone Study  Result Date: 05/28/2022 CLINICAL DATA:  61 year old male with history of flank pain. Suspected kidney stone. Blood in catheter. EXAM: CT ABDOMEN AND PELVIS WITHOUT CONTRAST TECHNIQUE: Multidetector CT imaging of the abdomen and pelvis was performed following the standard protocol without IV contrast. RADIATION DOSE REDUCTION: This exam was performed according to the departmental dose-optimization program which includes automated exposure control, adjustment of the mA and/or kV according to patient size and/or use of iterative reconstruction technique. COMPARISON:  CT the abdomen and pelvis 05/21/2022. FINDINGS: Lower chest: Atherosclerotic calcifications in the left anterior descending, left circumflex and right coronary arteries. Hepatobiliary: No definite suspicious cystic or solid hepatic lesions are  confidently identified on today's noncontrast CT examination. Status post cholecystectomy. Pancreas: No definite pancreatic mass or peripancreatic fluid collections or inflammatory changes are noted on today's noncontrast CT examination. Spleen: Unremarkable. Adrenals/Urinary Tract: There has been interval placement of a left-sided double-J ureteral stent, proximal loop which is reformed in the left renal pelvis and collecting system, and distal loop is reformed in the urinary bladder. Small amount of gas in the left renal collecting system is presumably iatrogenic. 4 mm nonobstructive calculus in the lower pole collecting system of the left kidney. Previously noted left ureteral stone has migrated, now in the distal third of the left ureter (axial image 73 of series 2 demonstrates a 3 mm calculus adjacent to the indwelling stent). Unenhanced appearance of the right kidney and bilateral adrenal glands is otherwise normal. No hydroureteronephrosis. Some high attenuation material is noted within the lumen of the urinary bladder, presumably thrombus. Foley balloon catheter present within the lumen of the urinary bladder which is otherwise decompressed around the indwelling Foley balloon catheter. Stomach/Bowel: Unenhanced appearance of the stomach is normal. There is no pathologic dilatation of small bowel or colon. Status post appendectomy. Vascular/Lymphatic: Aortic atherosclerosis. No lymphadenopathy noted in the abdomen or pelvis. Reproductive: Prostate gland is severely enlarged with severe median lobe hypertrophy. Seminal vesicles are unremarkable in appearance. Other: No significant volume of ascites.  No pneumoperitoneum. Musculoskeletal: There are no aggressive appearing lytic or blastic  lesions noted in the visualized portions of the skeleton. IMPRESSION: 1. Interval placement of left-sided double-J ureteral stent. Previously noted left ureteral stone has migrated to the distal third of the left ureter. There  is a small amount of clot in the lumen of the urinary bladder on today's examination. 2. 4 mm nonobstructive calculus in the lower pole collecting system of the left kidney again noted. 3. Prostatomegaly with severe median lobe hypertrophy in the prostate gland. 4. Aortic atherosclerosis. Electronically Signed   By: Vinnie Langton M.D.   On: 05/28/2022 13:57   DG C-Arm 1-60 Min-No Report  Result Date: 05/22/2022 Fluoroscopy was utilized by the requesting physician.  No radiographic interpretation.   DG C-Arm 1-60 Min-No Report  Result Date: 05/22/2022 Fluoroscopy was utilized by the requesting physician.  No radiographic interpretation.   CT RENAL STONE STUDY  Result Date: 05/21/2022 CLINICAL DATA:  Nephrolithiasis, back pain. EXAM: CT ABDOMEN AND PELVIS WITHOUT CONTRAST TECHNIQUE: Multidetector CT imaging of the abdomen and pelvis was performed following the standard protocol without IV contrast. RADIATION DOSE REDUCTION: This exam was performed according to the departmental dose-optimization program which includes automated exposure control, adjustment of the mA and/or kV according to patient size and/or use of iterative reconstruction technique. COMPARISON:  09/20/2021. FINDINGS: Lower chest: Scattered coronary artery calcifications are noted. Dependent atelectasis is noted bilaterally. A stable 4 mm nodule is present in the left lower lobe, axial image 38. Hepatobiliary: No focal liver abnormality is seen. Status post cholecystectomy. No biliary dilatation. Pancreas: Unremarkable. No pancreatic ductal dilatation or surrounding inflammatory changes. Spleen: Normal in size without focal abnormality. Adrenals/Urinary Tract: The adrenal glands are within normal limits. A nonobstructive calculus is present in the lower pole the left kidney. A 4 x 6 mm stone is present in the mid left ureter resulting in mild hydronephrosis. Perinephric and periureteral fat stranding are noted on the left. No obstructive  uropathy on the right. Bladder is unremarkable. Stomach/Bowel: Stomach is within normal limits. Appendix is surgically absent. No evidence of bowel wall thickening, distention, or inflammatory changes. No free air or pneumatosis. Vascular/Lymphatic: Aortic atherosclerosis. No enlarged abdominal or pelvic lymph nodes. Reproductive: The prostate gland is enlarged with a nodular contour exerting mass effect on the base of the urinary bladder. Other: Small fat containing inguinal hernias bilaterally. No ascites. Musculoskeletal: Mild degenerative changes in the thoracolumbar spine. No acute osseous abnormality. IMPRESSION: 1. Mild obstructive uropathy on the left with a 6 x 4 mm calculus in the mid left ureter. 2. Nonobstructive left renal calculus. 3. Enlarged prostate gland. 4. Aortic atherosclerosis. 5. Stable 4 mm nodule in the left lower lobe. No follow-up recommended. This recommendation follows the consensus statement: Guidelines for Management of Incidental Pulmonary Nodules Detected on CT Images: From the Fleischner Society 2017; Radiology 2017; 284:228-243. Electronically Signed   By: Brett Fairy M.D.   On: 05/21/2022 03:31   DG Abdomen 1 View  Result Date: 05/18/2022 CLINICAL DATA:  Left flank pain.  Recent lithotripsy EXAM: ABDOMEN - 1 VIEW COMPARISON:  05/09/2022 FINDINGS: Two small calculi overlying the left lower pole. These likely are fragmented pieces of the slightly larger stone seen on the prior study. No ureteral stone. No right renal calculi Phleboliths in left pelvis unchanged. IMPRESSION: Left renal calculus appears fragmented, now overlying the left lower pole. No ureteral calculus identified. Electronically Signed   By: Franchot Gallo M.D.   On: 05/18/2022 13:28   DG Abd 1 View  Result Date: 05/10/2022 CLINICAL DATA:  Left-sided flank pain.  Patient for lithotripsy. EXAM: ABDOMEN - 1 VIEW COMPARISON:  Abdomen 05/04/2022. Renal ultrasound 04/29/2022. CT 09/20/2021. FINDINGS: Surgical  clips noted over the abdomen. Stool noted throughout the colon. No bowel distention. 5 mm left renal stone again noted in similar position. Calcific densities over the right kidney cannot be completely excluded. This may represent overlying bowel/stool. Pelvic calcifications appear stable and are most likely phleboliths. Degenerative change lumbar spine and both hips. Tiny sclerotic density again noted over the right ilium, most likely a benign bone island. IMPRESSION: 1.  5 mm left renal stone again noted in similar position. 2. Calcific densities in the right kidney cannot be completely excluded. These may represent overlying bowel/stool. Pelvic calcifications appear stable and are most likely phleboliths. Electronically Signed   By: Marcello Moores  Register M.D.   On: 05/10/2022 09:12    Microbiology: Results for orders placed or performed during the hospital encounter of 06/01/22  Urine Culture     Status: Abnormal   Collection Time: 06/01/22  3:02 PM   Specimen: Urine, Clean Catch  Result Value Ref Range Status   Specimen Description   Final    URINE, CLEAN CATCH Performed at [facility], [place] [address] [place], [place] [address]    Special Requests   Final    NONE Performed at [facility], [place] [address] [place], [place] [address]    Culture 20,000 COLONIES/mL ENTEROCOCCUS FAECALIS (A)  Final   Report Status 06/03/2022 FINAL  Final   Organism ID, Bacteria ENTEROCOCCUS FAECALIS (A)  Final      Susceptibility   Enterococcus faecalis - MIC*    AMPICILLIN <=2 SENSITIVE Sensitive     NITROFURANTOIN <=16 SENSITIVE Sensitive     VANCOMYCIN 1 SENSITIVE Sensitive     * 20,000 COLONIES/mL ENTEROCOCCUS FAECALIS  Culture, blood (x 2)     Status: None   Collection Time: 06/01/22  7:58 PM   Specimen: BLOOD  Result Value Ref Range Status   Specimen Description   Final    BLOOD LEFT HAND Performed at [place] [address]  [place], [place] [address]    Special Requests   Final    BOTTLES DRAWN AEROBIC AND ANAEROBIC Blood Culture adequate volume Performed at [place] [address] [place], [place] [address]    Culture   Final    NO GROWTH 5 DAYS Performed at [facility], [place] [address] [place], [place] [address]    Report Status 06/06/2022 FINAL  Final  Culture, blood (Routine X 2) w Reflex to ID Panel     Status: None (Preliminary result)   Collection Time: 06/03/22  6:41 PM   Specimen: BLOOD  Result Value Ref Range Status   Specimen Description   Final    BLOOD BLOOD LEFT HAND Performed at [place] [address], [place], [place] [address]    Special Requests   Final    BOTTLES DRAWN AEROBIC ONLY Blood Culture adequate volume Performed at [place] [address] [place], [place] [address]    Culture   Final    NO GROWTH 3 DAYS Performed at [facility], [place] [address] [place] [address] [place], [place] [address]    Report Status PENDING  Incomplete  Culture, blood (Routine X 2) w Reflex to ID Panel     Status: None (Preliminary result)   Collection Time: 06/03/22  6:43 PM   Specimen:  BLOOD  Result Value Ref Range Status   Specimen Description   Final    BLOOD LEFT ANTECUBITAL Performed at [place] [address], [place], [place] [address]    Special Requests   Final    BOTTLES DRAWN AEROBIC ONLY Blood Culture adequate volume Performed at [place] [address] [place], [place] [address]    Culture   Final    NO GROWTH 3 DAYS Performed at [facility], [place] [address] [place], [place] [address]    Report Status PENDING  Incomplete  Urine Culture     Status: None   Collection Time: 06/04/22  9:03 AM   Specimen: Urine, Catheterized  Result Value Ref Range Status   Specimen Description   Final    URINE, CATHETERIZED Performed at [place] [address], [place], [place] [address]    Special Requests   Final    NONE Performed at [facility], [place] [address], [place], [place] [address]    Culture   Final    NO GROWTH Performed at [facility], [place] [address] [place],  [address]    Report Status 06/05/2022 FINAL  Final    Labs: CBC: Recent Labs  Lab 06/02/22 0352 06/03/22 0414 06/04/22 0443 06/05/22 0414 06/06/22 0342  WBC 10.4 9.8 16.6* 17.3* 10.3  NEUTROABS 8.5*  --   --   --   --   HGB 10.9* 13.0 11.2* 11.7* 11.4*  HCT 31.8* 37.7* 31.7* 34.8* 33.9*  MCV 88.6 88.5 87.1 90.4 89.7  PLT 206 259 243 303 794   Basic Metabolic Panel: Recent Labs  Lab 06/02/22 0352 06/03/22 0414 06/04/22 0443 06/05/22 0414 06/06/22 0342  NA 133* 136 134* 138 140  K 3.5 3.3* 3.4* 4.1 3.5  CL 101 102 101 103 107  CO2 '22 25 23 25 24  '$ GLUCOSE 110* 117* 129* 115* 113*  BUN '16 11 9 10 13  '$ CREATININE 0.89 0.93 1.02 1.05 0.89  CALCIUM 8.0* 8.4* 7.9* 8.1* 7.9*  MG 1.9  --   --   --   --   PHOS 2.4*  --   --   --   --    Liver Function Tests: Recent Labs  Lab 06/02/22 0352 06/03/22 0414 06/04/22 0443 06/05/22 0414 06/06/22 0342  AST 231* 441* 97* 104* 221*  ALT 169* 360* 196* 170* 212*  ALKPHOS 58 109 83 103 91  BILITOT 0.8 1.0 1.0 0.7 0.7  PROT 5.7* 6.6 5.6* 6.4* 5.8*  ALBUMIN 2.7* 3.1* 2.5* 2.6* 2.3*   CBG: Recent Labs  Lab 06/05/22 1955 06/05/22 2347 06/06/22 0415 06/06/22 0749 06/06/22 1146  GLUCAP 152* 146* 112* 110* 166*    Discharge time spent: greater than 30 minutes.  Signed: Oswald Hillock, MD [facility] 06/06/2022

## 2022-06-07 ENCOUNTER — Ambulatory Visit (HOSPITAL_COMMUNITY): Payer: BC Managed Care – PPO

## 2022-06-07 ENCOUNTER — Encounter (HOSPITAL_COMMUNITY)
Admission: RE | Disposition: A | Discharge status: Home / Self Care | Payer: Self-pay | Source: Home / Self Care | Attending: Urology

## 2022-06-07 ENCOUNTER — Ambulatory Visit (HOSPITAL_COMMUNITY): Payer: BC Managed Care – PPO | Admitting: Emergency Medicine

## 2022-06-07 ENCOUNTER — Telehealth: Payer: Self-pay | Admitting: *Deleted

## 2022-06-07 ENCOUNTER — Ambulatory Visit (HOSPITAL_COMMUNITY): Payer: BC Managed Care – PPO | Admitting: Physician Assistant

## 2022-06-07 ENCOUNTER — Other Ambulatory Visit: Payer: Self-pay

## 2022-06-07 ENCOUNTER — Ambulatory Visit (HOSPITAL_COMMUNITY)
Admission: RE | Admit: 2022-06-07 | Discharge: 2022-06-07 | Disposition: A | Discharge status: Home / Self Care | Payer: BC Managed Care – PPO | Attending: Urology | Admitting: Urology

## 2022-06-07 ENCOUNTER — Encounter (HOSPITAL_COMMUNITY): Payer: Self-pay | Admitting: Urology

## 2022-06-07 DIAGNOSIS — I10 Essential (primary) hypertension: Secondary | ICD-10-CM | POA: Diagnosis not present

## 2022-06-07 DIAGNOSIS — R338 Other retention of urine: Secondary | ICD-10-CM | POA: Insufficient documentation

## 2022-06-07 DIAGNOSIS — M1990 Unspecified osteoarthritis, unspecified site: Secondary | ICD-10-CM | POA: Diagnosis not present

## 2022-06-07 DIAGNOSIS — N401 Enlarged prostate with lower urinary tract symptoms: Secondary | ICD-10-CM | POA: Insufficient documentation

## 2022-06-07 DIAGNOSIS — N132 Hydronephrosis with renal and ureteral calculous obstruction: Secondary | ICD-10-CM | POA: Diagnosis not present

## 2022-06-07 DIAGNOSIS — K219 Gastro-esophageal reflux disease without esophagitis: Secondary | ICD-10-CM | POA: Insufficient documentation

## 2022-06-07 DIAGNOSIS — Z8546 Personal history of malignant neoplasm of prostate: Secondary | ICD-10-CM | POA: Diagnosis not present

## 2022-06-07 DIAGNOSIS — Z01818 Encounter for other preprocedural examination: Secondary | ICD-10-CM

## 2022-06-07 DIAGNOSIS — J4540 Moderate persistent asthma, uncomplicated: Secondary | ICD-10-CM | POA: Diagnosis not present

## 2022-06-07 DIAGNOSIS — E119 Type 2 diabetes mellitus without complications: Secondary | ICD-10-CM | POA: Insufficient documentation

## 2022-06-07 DIAGNOSIS — G4730 Sleep apnea, unspecified: Secondary | ICD-10-CM | POA: Diagnosis not present

## 2022-06-07 DIAGNOSIS — N202 Calculus of kidney with calculus of ureter: Secondary | ICD-10-CM | POA: Diagnosis present

## 2022-06-07 HISTORY — PX: HOLMIUM LASER APPLICATION: SHX5852

## 2022-06-07 HISTORY — PX: CYSTOSCOPY WITH RETROGRADE PYELOGRAM, URETEROSCOPY AND STENT PLACEMENT: SHX5789

## 2022-06-07 HISTORY — PX: TRANSURETHRAL RESECTION OF PROSTATE: SHX73

## 2022-06-07 LAB — GLUCOSE, CAPILLARY: Glucose-Capillary: 151 mg/dL — ABNORMAL HIGH (ref 70–99)

## 2022-06-07 SURGERY — CYSTOURETEROSCOPY, WITH RETROGRADE PYELOGRAM AND STENT INSERTION
Anesthesia: General | Site: Ureter

## 2022-06-07 MED ORDER — CHLORHEXIDINE GLUCONATE 0.12 % MT SOLN
15.0000 mL | Freq: Once | OROMUCOSAL | Status: AC
  Administered 2022-06-07: 15 mL via OROMUCOSAL

## 2022-06-07 MED ORDER — 0.9 % SODIUM CHLORIDE (POUR BTL) OPTIME
TOPICAL | Status: DC | PRN
  Administered 2022-06-07: 1000 mL

## 2022-06-07 MED ORDER — LACTATED RINGERS IV SOLN: INTRAVENOUS | Status: DC

## 2022-06-07 MED ORDER — CEFAZOLIN SODIUM-DEXTROSE 2-4 GM/100ML-% IV SOLN
2.0000 g | INTRAVENOUS | Status: AC
  Administered 2022-06-07: 2 g via INTRAVENOUS
  Filled 2022-06-07: qty 100

## 2022-06-07 MED ORDER — EPHEDRINE 5 MG/ML INJ
INTRAVENOUS | Status: AC
  Filled 2022-06-07: qty 5

## 2022-06-07 MED ORDER — EPHEDRINE SULFATE-NACL 50-0.9 MG/10ML-% IV SOSY
PREFILLED_SYRINGE | INTRAVENOUS | Status: DC | PRN
  Administered 2022-06-07: 5 mg via INTRAVENOUS

## 2022-06-07 MED ORDER — FENTANYL CITRATE PF 50 MCG/ML IJ SOSY
PREFILLED_SYRINGE | INTRAMUSCULAR | Status: AC
  Filled 2022-06-07: qty 1

## 2022-06-07 MED ORDER — FENTANYL CITRATE (PF) 100 MCG/2ML IJ SOLN
INTRAMUSCULAR | Status: DC | PRN
  Administered 2022-06-07: 50 ug via INTRAVENOUS
  Administered 2022-06-07 (×8): 25 ug via INTRAVENOUS

## 2022-06-07 MED ORDER — DEXAMETHASONE SODIUM PHOSPHATE 10 MG/ML IJ SOLN
INTRAMUSCULAR | Status: DC | PRN
  Administered 2022-06-07: 8 mg via INTRAVENOUS

## 2022-06-07 MED ORDER — FENTANYL CITRATE (PF) 100 MCG/2ML IJ SOLN
INTRAMUSCULAR | Status: AC
  Filled 2022-06-07: qty 2

## 2022-06-07 MED ORDER — MIDAZOLAM HCL 5 MG/5ML IJ SOLN
INTRAMUSCULAR | Status: DC | PRN
  Administered 2022-06-07: 2 mg via INTRAVENOUS

## 2022-06-07 MED ORDER — ONDANSETRON HCL 4 MG/2ML IJ SOLN
INTRAMUSCULAR | Status: AC
  Filled 2022-06-07: qty 2

## 2022-06-07 MED ORDER — DEXAMETHASONE SODIUM PHOSPHATE 10 MG/ML IJ SOLN
INTRAMUSCULAR | Status: AC
  Filled 2022-06-07: qty 1

## 2022-06-07 MED ORDER — MIDAZOLAM HCL 2 MG/2ML IJ SOLN
INTRAMUSCULAR | Status: AC
  Filled 2022-06-07: qty 2

## 2022-06-07 MED ORDER — FENTANYL CITRATE PF 50 MCG/ML IJ SOSY
PREFILLED_SYRINGE | INTRAMUSCULAR | Status: AC
  Administered 2022-06-07: 50 ug via INTRAVENOUS
  Filled 2022-06-07: qty 2

## 2022-06-07 MED ORDER — SODIUM CHLORIDE 0.9 % IR SOLN
Status: DC | PRN
  Administered 2022-06-07: 6000 mL
  Administered 2022-06-07 (×2): 3000 mL

## 2022-06-07 MED ORDER — ONDANSETRON HCL 4 MG/2ML IJ SOLN
INTRAMUSCULAR | Status: DC | PRN
  Administered 2022-06-07: 4 mg via INTRAVENOUS

## 2022-06-07 MED ORDER — LIDOCAINE 2% (20 MG/ML) 5 ML SYRINGE
INTRAMUSCULAR | Status: DC | PRN
  Administered 2022-06-07: 100 mg via INTRAVENOUS

## 2022-06-07 MED ORDER — ORAL CARE MOUTH RINSE
15.0000 mL | Freq: Once | OROMUCOSAL | Status: AC
Start: 2022-06-07 — End: 2022-06-07

## 2022-06-07 MED ORDER — PROPOFOL 10 MG/ML IV BOLUS
INTRAVENOUS | Status: AC
  Filled 2022-06-07: qty 20

## 2022-06-07 MED ORDER — FENTANYL CITRATE PF 50 MCG/ML IJ SOSY
25.0000 ug | PREFILLED_SYRINGE | INTRAMUSCULAR | Status: DC | PRN
  Administered 2022-06-07 (×2): 50 ug via INTRAVENOUS

## 2022-06-07 MED ORDER — PHENYLEPHRINE HCL (PRESSORS) 10 MG/ML IV SOLN
INTRAVENOUS | Status: AC
  Filled 2022-06-07: qty 1

## 2022-06-07 MED ORDER — PROPOFOL 10 MG/ML IV BOLUS
INTRAVENOUS | Status: DC | PRN
  Administered 2022-06-07: 200 mg via INTRAVENOUS

## 2022-06-07 SURGICAL SUPPLY — 36 items
BAG URINE DRAIN 2000ML AR STRL (UROLOGICAL SUPPLIES) ×1 IMPLANT
BAG URO CATCHER STRL LF (MISCELLANEOUS) ×4 IMPLANT
BASKET ZERO TIP NITINOL 2.4FR (BASKET) ×1 IMPLANT
CATH COUDE 22FR 5CC (CATHETERS) IMPLANT
CATH FOLEY 3WAY 30CC 22FR (CATHETERS) IMPLANT
CATH HEMA 3WAY 30CC 22FR COUDE (CATHETERS) IMPLANT
CATH RIBBED COUDE 30CC (CATHETERS) ×4
CATH URET FLEX-TIP 2 LUMEN 10F (CATHETERS) ×1 IMPLANT
CATH URETL OPEN 5X70 (CATHETERS) ×4 IMPLANT
CLOTH BEACON ORANGE TIMEOUT ST (SAFETY) ×4 IMPLANT
DRAPE FOOT SWITCH (DRAPES) ×4 IMPLANT
DRSG TEGADERM 2-3/8X2-3/4 SM (GAUZE/BANDAGES/DRESSINGS) IMPLANT
EXTRACTOR STONE 1.7FRX115CM (UROLOGICAL SUPPLIES) IMPLANT
GLOVE BIO SURGEON STRL SZ 6.5 (GLOVE) ×4 IMPLANT
GOWN STRL REUS W/ TWL LRG LVL3 (GOWN DISPOSABLE) ×3 IMPLANT
GOWN STRL REUS W/TWL LRG LVL3 (GOWN DISPOSABLE) ×4
GUIDEWIRE STR DUAL SENSOR (WIRE) ×5 IMPLANT
HOLDER FOLEY CATH W/STRAP (MISCELLANEOUS) ×4 IMPLANT
KIT BALLIN UROMAX 15FX10 (LABEL) IMPLANT
KIT TURNOVER KIT A (KITS) IMPLANT
LASER FIB FLEXIVA PULSE ID 365 (Laser) ×3 IMPLANT
LOOP CUT BIPOLAR 24F LRG (ELECTROSURGICAL) ×4 IMPLANT
MANIFOLD NEPTUNE II (INSTRUMENTS) ×4 IMPLANT
PACK CYSTO (CUSTOM PROCEDURE TRAY) ×4 IMPLANT
PLUG CATH AND CAP STER (CATHETERS) ×4 IMPLANT
SET HIGH PRES BAL DIL (LABEL) ×4
SHEATH NAVIGATOR HD 11/13X28 (SHEATH) IMPLANT
SHEATH NAVIGATOR HD 11/13X36 (SHEATH) ×1 IMPLANT
STENT URET 6FRX24 CONTOUR (STENTS) ×1 IMPLANT
SYR 30ML LL (SYRINGE) IMPLANT
SYR TOOMEY IRRIG 70ML (MISCELLANEOUS) ×8
SYRINGE TOOMEY IRRIG 70ML (MISCELLANEOUS) ×3 IMPLANT
TRACTIP FLEXIVA PULS ID 200XHI (Laser) ×3 IMPLANT
TRACTIP FLEXIVA PULSE ID 200 (Laser) ×4
TUBING CONNECTING 10 (TUBING) ×4 IMPLANT
TUBING UROLOGY SET (TUBING) ×4 IMPLANT

## 2022-06-07 NOTE — Telephone Encounter (Signed)
Transition Care Management Unsuccessful Follow-up Telephone Call  Date of discharge and from where:  06/06/22 from [facility];   06/07/22 from [facility]  Attempts:  1st Attempt  Reason for unsuccessful TCM follow-up call:  Left voice message   Hubert Azure RN, MSN RN Care Management Coordinator  [phone] [email or web address]

## 2022-06-07 NOTE — Telephone Encounter (Signed)
This encounter was created in error - please disregard.

## 2022-06-07 NOTE — H&P (Signed)
Cc: left ureteral and renal calculi  History of present illness: 61 year old man with history of BPH as well as low risk prostate cancer and markedly enlarged prostate with 50% of prostate located in his bladder who recently underwent left ESWL.  He then presented back to the emergency room with left renal colic found to have a 3 mm left ureteral calculus.  He underwent cystoscopy, TURP of the median lobe to access left ureteral orifice and attempted ureteroscopy with left ureteral stent placement by Dr. Abner Greenspan on 05/22/2022.  He then developed urinary retention and presented back to the emergency room.  He was admitted for pain control and evaluation of transaminitis.  He is now here today for definitive removal of left ureteral calculus and renal calculi.  He has been on p.o. antibiotics since discharge from the hospital.   Review of systems: A 12 point comprehensive review of systems was obtained and is negative unless otherwise stated in the history of present illness.  Patient Active Problem List   Diagnosis Date Noted   Sepsis ([place]) 06/01/2022   Hypokalemia 06/01/2022   Right ankle pain 06/01/2022   DM2 (diabetes mellitus, type 2) ([place]) 06/01/2022   Constipation 06/01/2022   UTI (urinary tract infection) 06/01/2022   SBO (small bowel obstruction) ([place]) 06/01/2022   Intractable pain 05/21/2022   Obstructive uropathy 05/21/2022   AKI (acute kidney injury) ([place]) 05/21/2022   Moderate persistent asthma with acute exacerbation 04/28/2022   Allergic rhinitis 04/28/2022   Back pain 04/28/2022   DOE (dyspnea on exertion) 04/28/2022   History of prostate cancer 04/25/2022   History of kidney stones 04/25/2022   Rosacea 04/19/2022   Malignant neoplasm of prostate ([place]) 04/19/2022   Enlarged prostate 01/12/2022   Neck pain 08/10/2021   Jammed interphalangeal joint of finger of left hand 03/10/2021   Closed nondisplaced fracture of fifth metacarpal bone of right hand 03/10/2021   Closed  nondisplaced fracture of shaft of fourth metacarpal bone of right hand 03/10/2021   Prediabetes 04/02/2019   HLD (hyperlipidemia) 12/23/2014   Psoriasis 06/27/2014   Multiple lung nodules 12/09/2011   Musculoskeletal back pain 06/10/2010   PULMONARY HYPERTENSION 03/04/2010   Essential hypertension 02/10/2010   OBSTRUCTIVE SLEEP APNEA 02/04/2010    No current facility-administered medications on file prior to encounter.   Current Outpatient Medications on File Prior to Encounter  Medication Sig Dispense Refill   albuterol (VENTOLIN HFA) 108 (90 Base) MCG/ACT inhaler INHALE 2 PUFFS INTO THE LUNGS EVERY 4 HOURS AS NEEDED FOR WHEEZING OR SHORTNESS OF BREATH(COUGHING FITS) (Patient taking differently: Inhale 2 puffs into the lungs every 4 (four) hours as needed for wheezing or shortness of breath.) 8.5 g 1   amLODipine (NORVASC) 5 MG tablet TAKE 1 TABLET(5 MG) BY MOUTH DAILY (Patient taking differently: Take 5 mg by mouth daily.) 90 tablet 3   finasteride (PROSCAR) 5 MG tablet Take 1 tablet (5 mg total) by mouth daily. 30 tablet 1   oxybutynin (DITROPAN) 5 MG tablet Take 1 tablet (5 mg total) by mouth every 8 (eight) hours as needed for bladder spasms. 30 tablet 0   oxyCODONE-acetaminophen (PERCOCET/ROXICET) 5-325 MG tablet Take 1 tablet by mouth every 6 (six) hours as needed for severe pain.     tamsulosin (FLOMAX) 0.4 MG CAPS capsule Take 1 capsule (0.4 mg total) by mouth at bedtime. 30 capsule 1   Budeson-Glycopyrrol-Formoterol (BREZTRI AEROSPHERE) 160-9-4.8 MCG/ACT AERO Inhale two puffs with spacer twice daily to prevent cough or wheeze.  Rinse, gargle,  and spit after use. 10.7 g 5   ondansetron (ZOFRAN-ODT) 4 MG disintegrating tablet '4mg'$  ODT q4 hours prn nausea/vomit (Patient taking differently: Take 4 mg by mouth every 4 (four) hours as needed for nausea or vomiting.) 10 tablet 0    Past Medical History:  Diagnosis Date   Allergy    Flonase, Patanol, Zyrtec   Arthritis    psoriasis    Asthma    seasonal, with allergies   Borderline diabetes    Cluster headache    Colon polyp 10/27/2011   Depression    denies 11/11/16; denies 11/21/18 "been years"   Eczema    GERD (gastroesophageal reflux disease)    History of kidney stones    x1   Hyperlipidemia    Hypertension    Low back pain    "in kidney area-was told that it was related to gallstones"   Prediabetes    Prostate cancer ([place])    Psoriasis    [name]   Severe sleep apnea    h/o; had procedure and weight control, does not have to wear CPAP   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 Repeat 5 years.   CYSTOSCOPY WITH RETROGRADE PYELOGRAM, URETEROSCOPY AND STENT PLACEMENT Left 05/22/2022   Procedure: CYSTOSCOPY WITH RETROGRADE PYELOGRAM, URETEROSCOPY AND STENT PLACEMENT; TRANSURETHRAL RESECTION OF PROSTATE;  Surgeon: Janith Lima, MD;  Location: [facility];  Service: Urology;  Laterality: Left;   EXTRACORPOREAL SHOCK WAVE LITHOTRIPSY Left 05/09/2022   Procedure: EXTRACORPOREAL SHOCK WAVE LITHOTRIPSY (ESWL);  Surgeon: Alexis Frock, MD;  Location: [facility];  Service: Urology;  Laterality: Left;   POLYPECTOMY     PROSTATE BIOPSY  10/2014   will have another in 3 months   SEPTOPLASTY  2014   TONSILLECTOMY  2014   UVULOPALATOPHARYNGOPLASTY  2014    Social History   Tobacco Use   Smoking status: Never   Smokeless tobacco: Never  Vaping Use   Vaping Use: Never used  Substance Use Topics   Alcohol use: Never    Alcohol/week: 0.0 standard drinks of alcohol   Drug use: Never    Family History  Problem Relation Age of Onset   Stroke Mother    Asthma  Sister    Colon cancer Neg Hx    Esophageal cancer Neg Hx    Stomach cancer Neg Hx    Prostate cancer Neg Hx    Diabetes Neg Hx    CAD Neg Hx    Migraines Neg Hx    Headache Neg Hx     PE: Vitals:   06/07/22 0749  BP: 134/90  Pulse: 83  Resp: 20  Temp: 97.6 F (36.4 C)  TempSrc: Oral  SpO2: 100%  Weight: 82.6 kg  Height: '5\' 6"'$  (1.676 m)   Patient appears to be in no acute distress  patient is alert and oriented x3 Atraumatic normocephalic head No cervical or supraclavicular lymphadenopathy appreciated No increased work of breathing, no audible wheezes/rhonchi Regular sinus rhythm/rate Abdomen is soft, nontender, nondistended Lower extremities are symmetric without appreciable edema Grossly neurologically intact No identifiable skin lesions  Recent Labs    06/05/22 0414 06/06/22 0342  WBC  17.3* 10.3  HGB 11.7* 11.4*  HCT 34.8* 33.9*   Recent Labs    06/05/22 0414 06/06/22 0342  NA 138 140  K 4.1 3.5  CL 103 107  CO2 25 24  GLUCOSE 115* 113*  BUN 10 13  CREATININE 1.05 0.89  CALCIUM 8.1* 7.9*   No results for input(s): "LABPT", "INR" in the last 72 hours. No results for input(s): "LABURIN" in the last 72 hours. Results for orders placed or performed during the hospital encounter of 06/01/22  Urine Culture     Status: Abnormal   Collection Time: 06/01/22  3:02 PM   Specimen: Urine, Clean Catch  Result Value Ref Range Status   Specimen Description   Final    URINE, CLEAN CATCH Performed at [facility], [place] [address] [place], [place] [address]    Special Requests   Final    NONE Performed at [facility], [place] [address], [place], [place] [address]    Culture 20,000 COLONIES/mL ENTEROCOCCUS FAECALIS (A)  Final   Report Status 06/03/2022 FINAL  Final   Organism ID, Bacteria ENTEROCOCCUS FAECALIS (A)  Final      Susceptibility   Enterococcus faecalis - MIC*    AMPICILLIN <=2 SENSITIVE Sensitive      NITROFURANTOIN <=16 SENSITIVE Sensitive     VANCOMYCIN 1 SENSITIVE Sensitive     * 20,000 COLONIES/mL ENTEROCOCCUS FAECALIS  Culture, blood (x 2)     Status: None   Collection Time: 06/01/22  7:58 PM   Specimen: BLOOD  Result Value Ref Range Status   Specimen Description   Final    BLOOD LEFT HAND Performed at [place] [address] [place], [place] [address]    Special Requests   Final    BOTTLES DRAWN AEROBIC AND ANAEROBIC Blood Culture adequate volume Performed at [place] [address] [place], [place] [address]    Culture   Final    NO GROWTH 5 DAYS Performed at [facility], [place] [address], [place], [place] [address]    Report Status 06/06/2022 FINAL  Final  Culture, blood (Routine X 2) w Reflex to ID Panel     Status: None (Preliminary result)   Collection Time: 06/03/22  6:41 PM   Specimen: BLOOD  Result Value Ref Range Status   Specimen Description   Final    BLOOD BLOOD LEFT HAND Performed at [place] [address] [place], [place] [address]    Special Requests   Final    BOTTLES DRAWN AEROBIC ONLY Blood Culture adequate volume Performed at [place] [address], [place], [place] [address]    Culture   Final    NO GROWTH 3 DAYS Performed at [facility], [place] [address] [place], [place] [address]    Report Status PENDING  Incomplete  Culture, blood (Routine X 2) w Reflex to ID Panel     Status: None (Preliminary result)   Collection Time: 06/03/22  6:43 PM   Specimen: BLOOD  Result Value Ref Range Status   Specimen Description   Final    BLOOD LEFT ANTECUBITAL Performed at [place] [address], [place], [place] [address]    Special Requests   Final    BOTTLES DRAWN AEROBIC ONLY Blood Culture adequate volume Performed at [place] [address], [place], [place] [address]    Culture   Final  NO GROWTH 3 DAYS Performed at [facility], [place] [address] [place], [place] [address]    Report Status PENDING  Incomplete  Urine Culture     Status: None   Collection Time: 06/04/22  9:03 AM   Specimen: Urine, Catheterized  Result Value Ref Range Status   Specimen Description   Final    URINE, CATHETERIZED Performed at [place] [address], [place], [place] [address]    Special Requests   Final    NONE Performed at [facility], [place] [address], [place], [place] [address]    Culture   Final    NO GROWTH Performed at [facility], [place] [address] [place],  [address]    Report Status 06/05/2022 FINAL  Final    Imaging: IMPRESSION: 1. No bowel obstruction. 2. Mild left hydronephrosis, new or increased since the prior CT. Similar positioning of the left ureteral stent. A 3 mm stone in the distal left ureter along the stent in similar location as the prior CT. Several small nonobstructing left renal inferior pole calculi. 3. Enlarged prostate gland with lobulated median lobe hypertrophy indenting and protruding through the base of the bladder. 4. Aortic Atherosclerosis (ICD10-I70.0).     Electronically Signed   By: Anner Crete M.D.   On: 06/02/2022 02:44    Imp/Recommendations: Urolithiasis: Patient is here today for definitive removal of ureteral and renal calculi.  Risks and benefits of the procedure in detail were discussed with the patient including but not limited pain, stent pain, damage to surrounding structures, need for repeat TUR, inability to remove stones, infection, need for additional procedures.  In addition as he is in urinary retention we will replace ureteral stent at the end of the case as well as indwelling Foley catheter.  Both will planned to be removed in 3 days.    Elmon Shader D Kamori Barbier

## 2022-06-07 NOTE — Interval H&P Note (Signed)
History and Physical Interval Note:  06/07/2022 9:00 AM  Rickey Cruz  has presented today for surgery, with the diagnosis of LEFT URETERAL CALCULUS.  The various methods of treatment have been discussed with the patient and family. After consideration of risks, benefits and other options for treatment, the patient has consented to  Procedure(s) with comments: CYSTOSCOPY WITH RETROGRADE PYELOGRAM, URETEROSCOPY AND STENT EXCHANGE (Left) - 90 MINS HOLMIUM LASER APPLICATION (Left) TRANSURETHRAL RESECTION OF THE PROSTATE (TURP) (N/A) as a surgical intervention.  The patient's history has been reviewed, patient examined, no change in status, stable for surgery.  I have reviewed the patient's chart and labs.  Questions were answered to the patient's satisfaction.     Hadlyn Amero D Laporsche Hoeger

## 2022-06-07 NOTE — Op Note (Signed)
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  Attempted ureteroscopy was performed however due to J hooking ureters unable to access as well as significant intravesical median lobe prohibiting scope movement without TURP of median lobe.  He was then hospitalized for persistent pain and found to have transaminitis and fever.  He is now here for definitive removal of left ureteral calculi.  r ureteral stone and associated left symptoms. After reviewing the management options for treatment, the patient elected to proceed with the above surgical procedure(s). We have discussed the potential benefits and risks of the procedure, side effects of the proposed treatment, the likelihood of the patient achieving the goals of the procedure, and any potential problems that might occur during the procedure or recuperation. Informed consent has been obtained.   Description of procedure:  The patient was taken to the operating room and general anesthesia was induced.  The patient  was placed in the dorsal lithotomy position, prepped and draped in the usual sterile fashion, and preoperative antibiotics were administered. A preoperative time-out was performed.   Cystourethroscopy was performed.  The patient's urethra was examined and  bilobar prostatic hypertrophy with a median lobe. The bladder was then systematically examined in its entirety. There was no evidence for any bladder tumors, stones, or other mucosal pathology.    Attention then turned to the left ureteral orifice and the existing left ureteral stent was grabbed with a grasper and brought to the urethral meatus.  Attempts at advancing the sensor wire through the ureteral stent were unsuccessful due to resistance.  A 0.38 sensor wire was then placed alongside the ureteral stent and advanced to the kidney with fluoroscopic guidance.  The stent was removed completely and deemed to be intact.  Next a semirigid ureteroscope was advanced alongside the wire however was unable to access the ureteral orifice and and advance into the ureter.  The ureteroscope was removed.  The resectoscope was then placed in the urethral meatus and advanced in the bladder under direct visualization.  The bipolar electrocautery loop was assembled and resection of the median lobe and left lateral lobe took place until the ureteral orifice could be visualized.    Attempts at placing a dual-lumen catheter over the wire were also unsuccessful due to the angle of the ureter.  A second wire was then placed through the cystoscope and used to cannulate the left UO and advanced to the kidney with fluoroscopic guidance.  Again attempts at advancing a semirigid ureteroscope along the wires were unsuccessful.  Both dual-lumen and single-lumen flexible ureteroscope's were then attempted to place over the wire to access the ureter also unsuccessful.  The decision was made to balloon dilate the left  UO.  The Ultrex balloon dilator was advanced over one of the  wires into the radiopaque marker spanned the area of the ureteral orifice.  The balloon was inflated to 15 mmHg.  It was held in place for approximately 1 minute.  It was then deflated and removed.  I then attempted to advance the flexible ureteroscope over this wire after balloon dilation however it again would not enter the ureter.  A final attempt with the semirigid ureteroscope to split the wires and enter the ureter was successful.  The obstructing ureteral calculus was seen in the distal ureter.  A 242 m laser fiber was then used to fragment the stone.  The ureteroscope was removed.  Next ureteral access sheath was placed over the second wire and advanced to the mid ureter with fluoroscopic guidance.  It would not advance any further due to the anatomy and tightness of the ureter.  The inner sheath and wire removed.  Flexible ureteroscopy then took place so that all of the fragments could be basketed.  A 0 tip basket was used to remove the stone fragments.  The ureteroscope was removed and unison with the access sheath taking care to examine the ureter on the way out.  No significant trauma or injury is seen to the ureter.   The wire was then backloaded through the cystoscope and a ureteral stent was advance over the wire using Seldinger technique.  The stent was positioned appropriately under fluoroscopic and cystoscopic guidance.  The wire was then removed with an adequate stent curl noted in the renal pelvis as well as in the bladder.  The bladder was then emptied and the procedure ended.  A 22 French coud Foley catheter was placed at the end of the case.  The patient appeared to tolerate the procedure well and without complications.  The patient was able to be awakened and transferred to the recovery unit in satisfactory condition.   Disposition: The ureteral stent tether was secured to the Foley catheter.  Patient will be discharged with both and return later this week for removal of both stent  and Foley catheter at the same time.

## 2022-06-07 NOTE — Telephone Encounter (Signed)
Transition Care Management Unsuccessful Follow-up Telephone Call  Date of discharge and from where:  06/06/22 from [facility];   06/07/22 from [place]  Attempts:  1st Attempt  Reason for unsuccessful TCM follow-up call:  Left voice message   Hubert Azure RN, MSN RN Care Management Coordinator  [phone] [email or web address]

## 2022-06-07 NOTE — Anesthesia Postprocedure Evaluation (Signed)
Anesthesia Post Note  Patient: [employer]  Procedure(s) Performed: CYSTOSCOPY WITH RETROGRADE PYELOGRAM, URETEROSCOPY AND STENT EXCHANGE (Left: Ureter) HOLMIUM LASER APPLICATION (Left: Renal) TRANSURETHRAL RESECTION OF THE PROSTATE (TURP) (Prostate)     Patient location during evaluation: PACU Anesthesia Type: General Level of consciousness: awake Pain management: pain level controlled Respiratory status: spontaneous breathing Cardiovascular status: stable Postop Assessment: no apparent nausea or vomiting Anesthetic complications: no   No notable events documented.  Last Vitals:  Vitals:   06/07/22 1215 06/07/22 1230  BP: (!) 145/101 (!) 134/98  Pulse: 73 71  Resp: 18 10  Temp:    SpO2: 95% 95%    Last Pain:  Vitals:   06/07/22 1230  TempSrc:   PainSc: Asleep                 Daeveon Zweber

## 2022-06-07 NOTE — Discharge Instructions (Signed)
DISCHARGE INSTRUCTIONS FOR KIDNEY STONE/URETERAL STENT   MEDICATIONS:  1. Resume all your other meds from home  2. Continue antibiotics until course if completed 3. Continue tamsulosin for stent discomfort and to help with voiding   ACTIVITY:  1. No strenuous activity x 1week  2. No driving while on narcotic pain medications  3. Drink plenty of water  4. Continue to walk at home - you can still get blood clots when you are at home, so keep active, but don't over do it.  5. May return to work/school tomorrow or when you feel ready   BATHING:  1. You can shower and we recommend daily showers    SIGNS/SYMPTOMS TO CALL:  Please call us if you have a fever greater than 101.5, uncontrolled nausea/vomiting, uncontrolled pain, dizziness, unable to urinate, bloody urine, chest pain, shortness of breath, leg swelling, leg pain, redness around wound, drainage from wound, or any other concerns or questions.   You can reach [place] at [phone].   FOLLOW-UP:  1. Your stent and foley will be removed on Friday, June 16 in the office.

## 2022-06-07 NOTE — Addendum Note (Signed)
Addended by: Hubert Azure D on: 06/07/2022 05:11 PM   Modules accepted: Orders, Level of Service

## 2022-06-07 NOTE — Anesthesia Preprocedure Evaluation (Signed)
Anesthesia Evaluation  Patient identified by MRN, date of birth, ID band Patient awake    Reviewed: Allergy & Precautions, NPO status   Airway Mallampati: II  TM Distance: >3 FB     Dental   Pulmonary asthma , sleep apnea ,    breath sounds clear to auscultation       Cardiovascular hypertension,  Rhythm:Regular Rate:Normal     Neuro/Psych  Headaches, PSYCHIATRIC DISORDERS    GI/Hepatic Neg liver ROS, GERD  ,  Endo/Other  diabetes  Renal/GU Renal disease     Musculoskeletal  (+) Arthritis ,   Abdominal   Peds  Hematology   Anesthesia Other Findings   Reproductive/Obstetrics                             Anesthesia Physical Anesthesia Plan  ASA: 3  Anesthesia Plan: General   Post-op Pain Management:    Induction: Intravenous  PONV Risk Score and Plan: 3 and Ondansetron, Dexamethasone and Midazolam  Airway Management Planned: LMA  Additional Equipment:   Intra-op Plan:   Post-operative Plan: Extubation in OR  Informed Consent: I have reviewed the patients History and Physical, chart, labs and discussed the procedure including the risks, benefits and alternatives for the proposed anesthesia with the patient or authorized representative who has indicated his/her understanding and acceptance.     Dental advisory given  Plan Discussed with: CRNA and Anesthesiologist  Anesthesia Plan Comments:         Anesthesia Quick Evaluation

## 2022-06-07 NOTE — Transfer of Care (Signed)
Immediate Anesthesia Transfer of Care Note  Patient: Rickey Cruz  Procedure(s) Performed: CYSTOSCOPY WITH RETROGRADE PYELOGRAM, URETEROSCOPY AND STENT EXCHANGE (Left: Ureter) HOLMIUM LASER APPLICATION (Left: Renal) TRANSURETHRAL RESECTION OF THE PROSTATE (TURP) (Prostate)  Patient Location: PACU  Anesthesia Type:General  Level of Consciousness: awake, alert , oriented and patient cooperative  Airway & Oxygen Therapy: Patient Spontanous Breathing and Patient connected to face mask oxygen  Post-op Assessment: Report given to RN, Post -op Vital signs reviewed and stable and Patient moving all extremities  Post vital signs: Reviewed and stable  Last Vitals:  Vitals Value Taken Time  BP    Temp    Pulse 80 06/07/22 1150  Resp 23 06/07/22 1150  SpO2 97 % 06/07/22 1150  Vitals shown include unvalidated device data.  Last Pain:  Vitals:   06/07/22 0805  TempSrc:   PainSc: 2       Patients Stated Pain Goal: 5 (38/45/36 4680)  Complications: No notable events documented.

## 2022-06-07 NOTE — Telephone Encounter (Deleted)
Transition Care Management Follow-up Telephone Call Date of discharge and from where: *** How have you been since you were released from the hospital? *** Any questions or concerns? {YES/NO TOC TRACKING USE FOR MANAGED MEDICAID REPORTING:24185}  Items Reviewed: Did the pt receive and understand the discharge instructions provided? {YES/NO:21197} Medications obtained and verified? {YES/NO:21197} Other? {YES/NO:21197} Any new allergies since your discharge? {YES/NO:21197} Dietary orders reviewed? {YES/NO TITLE CASE:22902} Do you have support at home? {YES/NO:21197}  Home Care and Equipment/Supplies: Were home health services ordered? {Response; yes/no/na:63} If so, what is the name of the agency? ***  Has the agency set up a time to come to the patient's home? {Response; yes/no/na:63} Were any new equipment or medical supplies ordered?  {Yes/No:[id number]} What is the name of the medical supply agency? *** Were you able to get the supplies/equipment? {Response; yes/no/na:63} Do you have any questions related to the use of the equipment or supplies? {Yes/No:[id number]}  Functional Questionnaire: (I = Independent and D = Dependent) ADLs: ***  Bathing/Dressing- ***  Meal Prep- ***  Eating- ***  Maintaining continence- ***  Transferring/Ambulation- ***  Managing Meds- ***  Follow up appointments reviewed:  PCP Hospital f/u appt confirmed? {YES/NO:21197} Scheduled to see *** on *** @ ***. Specialist Hospital f/u appt confirmed? {YES/NO:21197} Scheduled to see *** on *** @ ***. Are transportation arrangements needed? {YES/NO:21197} If their condition worsens, is the pt aware to call PCP or go to the Emergency Dept.? {YES/NO TITLE CASE:22902} Was the patient provided with contact information for the PCP's office or ED? {YES/NO TITLE CASE:22902} Was to pt encouraged to call back with questions or concerns? {YES/NO TITLE CASE:22902}  

## 2022-06-07 NOTE — Anesthesia Procedure Notes (Signed)
Procedure Name: LMA Insertion Date/Time: 06/07/2022 9:39 AM  Performed by: Victoriano Lain, CRNAPre-anesthesia Checklist: Patient identified, Emergency Drugs available, Suction available, Patient being monitored and Timeout performed Patient Re-evaluated:Patient Re-evaluated prior to induction Oxygen Delivery Method: Circle system utilized Preoxygenation: Pre-oxygenation with 100% oxygen Induction Type: IV induction Ventilation: Mask ventilation without difficulty LMA: LMA with gastric port inserted LMA Size: 4.0 Number of attempts: 1 Placement Confirmation: positive ETCO2 and breath sounds checked- equal and bilateral Tube secured with: Tape Dental Injury: Teeth and Oropharynx as per pre-operative assessment

## 2022-06-08 ENCOUNTER — Telehealth: Payer: Self-pay | Admitting: *Deleted

## 2022-06-08 ENCOUNTER — Encounter (HOSPITAL_COMMUNITY): Payer: Self-pay | Admitting: Urology

## 2022-06-08 LAB — CULTURE, BLOOD (ROUTINE X 2)
Culture: NO GROWTH
Culture: NO GROWTH
Special Requests: ADEQUATE
Special Requests: ADEQUATE

## 2022-06-08 LAB — SURGICAL PATHOLOGY

## 2022-06-08 NOTE — Telephone Encounter (Signed)
Transition Care Management Unsuccessful Follow-up Telephone Call  Date of discharge and from where:  06/06/22 from [facility];   06/07/22 from [place]  Attempts:  2nd Attempt  Reason for unsuccessful TCM follow-up call:  Left voice message   Hubert Azure RN, MSN RN Care Management Coordinator  [phone] [email or web address]

## 2022-06-20 ENCOUNTER — Ambulatory Visit: Payer: BC Managed Care – PPO | Admitting: Emergency Medicine

## 2022-06-20 ENCOUNTER — Encounter: Payer: Self-pay | Admitting: Emergency Medicine

## 2022-06-20 VITALS — BP 120/84 | HR 59 | Temp 98.4°F | Wt 172.5 lb

## 2022-06-20 DIAGNOSIS — R7303 Prediabetes: Secondary | ICD-10-CM | POA: Diagnosis not present

## 2022-06-20 DIAGNOSIS — I10 Essential (primary) hypertension: Secondary | ICD-10-CM

## 2022-06-20 DIAGNOSIS — E785 Hyperlipidemia, unspecified: Secondary | ICD-10-CM | POA: Diagnosis not present

## 2022-06-20 DIAGNOSIS — Z87442 Personal history of urinary calculi: Secondary | ICD-10-CM | POA: Diagnosis not present

## 2022-06-20 DIAGNOSIS — Z09 Encounter for follow-up examination after completed treatment for conditions other than malignant neoplasm: Secondary | ICD-10-CM | POA: Diagnosis not present

## 2022-06-20 LAB — COMPREHENSIVE METABOLIC PANEL
ALT: 60 U/L — ABNORMAL HIGH (ref 0–53)
AST: 22 U/L (ref 0–37)
Albumin: 4 g/dL (ref 3.5–5.2)
Alkaline Phosphatase: 80 U/L (ref 39–117)
BUN: 18 mg/dL (ref 6–23)
CO2: 28 mEq/L (ref 19–32)
Calcium: 9.3 mg/dL (ref 8.4–10.5)
Chloride: 105 mEq/L (ref 96–112)
Creatinine, Ser: 1.02 mg/dL (ref 0.40–1.50)
GFR: 79.61 mL/min (ref >60.00)
Glucose, Bld: 110 mg/dL — ABNORMAL HIGH (ref 70–99)
Potassium: 4 mEq/L (ref 3.5–5.1)
Sodium: 139 mEq/L (ref 135–145)
Total Bilirubin: 0.4 mg/dL (ref 0.2–1.2)
Total Protein: 6.8 g/dL (ref 6.0–8.3)

## 2022-06-20 LAB — CBC WITH DIFFERENTIAL/PLATELET
Basophils Absolute: 0 10*3/uL (ref 0.0–0.1)
Basophils Relative: 0.8 % (ref 0.0–3.0)
Eosinophils Absolute: 0.1 10*3/uL (ref 0.0–0.7)
Eosinophils Relative: 1.1 % (ref 0.0–5.0)
HCT: 37.8 % — ABNORMAL LOW (ref 39.0–52.0)
Hemoglobin: 12.8 g/dL — ABNORMAL LOW (ref 13.0–17.0)
Lymphocytes Relative: 17.5 % (ref 12.0–46.0)
Lymphs Abs: 0.8 10*3/uL (ref 0.7–4.0)
MCHC: 33.8 g/dL (ref 30.0–36.0)
MCV: 88.7 fl (ref 78.0–100.0)
Monocytes Absolute: 0.5 10*3/uL (ref 0.1–1.0)
Monocytes Relative: 10.8 % (ref 3.0–12.0)
Neutro Abs: 3.4 10*3/uL (ref 1.4–7.7)
Neutrophils Relative %: 69.8 % (ref 43.0–77.0)
Platelets: 370 10*3/uL (ref 150.0–400.0)
RBC: 4.26 Mil/uL (ref 4.22–5.81)
RDW: 14.1 % (ref 11.5–15.5)
WBC: 4.8 10*3/uL (ref 4.0–10.5)

## 2022-06-20 LAB — URINALYSIS, ROUTINE W REFLEX MICROSCOPIC
Bilirubin Urine: NEGATIVE
Ketones, ur: NEGATIVE
Nitrite: NEGATIVE
Specific Gravity, Urine: 1.025 (ref 1.000–1.030)
Total Protein, Urine: 100 — AB
Urine Glucose: NEGATIVE
Urobilinogen, UA: 0.2 (ref 0.0–1.0)
pH: 6 (ref 5.0–8.0)

## 2022-06-20 LAB — LDL CHOLESTEROL, DIRECT: Direct LDL: 90 mg/dL

## 2022-06-20 LAB — LIPID PANEL
Cholesterol: 176 mg/dL (ref 0–200)
HDL: 36 mg/dL — ABNORMAL LOW (ref >39.00)
NonHDL: 140.25
Total CHOL/HDL Ratio: 5
Triglycerides: 338 mg/dL — ABNORMAL HIGH (ref 0.0–149.0)
VLDL: 67.6 mg/dL — ABNORMAL HIGH (ref 0.0–40.0)

## 2022-06-20 NOTE — Assessment & Plan Note (Signed)
Diet and nutrition discussed.  Hemoglobin A1c done today. Advised to decrease amount of daily carbohydrate intake and daily calories.

## 2022-06-20 NOTE — Assessment & Plan Note (Addendum)
Stable.  Asymptomatic.  Recovering well. Continue Flomax 0.4 mg at bedtime. Has urology follow-up in 2 weeks.

## 2022-06-20 NOTE — Assessment & Plan Note (Signed)
Stable.  Diet and nutrition discussed. Not on medication at present time.

## 2022-06-21 LAB — URINE CULTURE: Result:: NO GROWTH

## 2022-06-30 ENCOUNTER — Encounter: Payer: Self-pay | Admitting: Emergency Medicine

## 2022-06-30 ENCOUNTER — Other Ambulatory Visit: Payer: Self-pay | Admitting: Emergency Medicine

## 2022-06-30 DIAGNOSIS — R2241 Localized swelling, mass and lump, right lower limb: Secondary | ICD-10-CM

## 2022-06-30 NOTE — Telephone Encounter (Signed)
IMPRESSION: 1. 1.4 cm superficial mass in the lateral right ankle at the site of palpable abnormality. 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 An epidermal inclusion cyst could have a similar appearance. Recommend general surgeon evaluation first.  Referral placed today.  Thanks.

## 2022-07-12 DIAGNOSIS — M25571 Pain in right ankle and joints of right foot: Secondary | ICD-10-CM | POA: Insufficient documentation

## 2022-07-18 ENCOUNTER — Other Ambulatory Visit: Payer: Self-pay | Admitting: Emergency Medicine

## 2022-07-18 DIAGNOSIS — I10 Essential (primary) hypertension: Secondary | ICD-10-CM

## 2022-07-18 NOTE — Telephone Encounter (Signed)
Should not be taking lisinopril according to my notes.  No refills on lisinopril.  Thanks.

## 2022-07-18 NOTE — Telephone Encounter (Signed)
Rec'd msg original prescription was discontinued on 06/06/2022 by Oswald Hillock, MD  pls advise on refill.Rickey Cruz

## 2022-08-13 ENCOUNTER — Other Ambulatory Visit: Payer: Self-pay | Admitting: Emergency Medicine

## 2022-09-26 ENCOUNTER — Encounter: Payer: Self-pay | Admitting: Emergency Medicine

## 2022-09-26 ENCOUNTER — Ambulatory Visit (INDEPENDENT_AMBULATORY_CARE_PROVIDER_SITE_OTHER): Payer: BC Managed Care – PPO

## 2022-09-26 ENCOUNTER — Ambulatory Visit: Payer: BC Managed Care – PPO | Admitting: Emergency Medicine

## 2022-09-26 VITALS — BP 122/78 | HR 65 | Temp 97.9°F | Ht 66.0 in | Wt 185.0 lb

## 2022-09-26 DIAGNOSIS — Z87442 Personal history of urinary calculi: Secondary | ICD-10-CM

## 2022-09-26 DIAGNOSIS — M549 Dorsalgia, unspecified: Secondary | ICD-10-CM | POA: Diagnosis not present

## 2022-09-26 DIAGNOSIS — E785 Hyperlipidemia, unspecified: Secondary | ICD-10-CM | POA: Diagnosis not present

## 2022-09-26 DIAGNOSIS — R7303 Prediabetes: Secondary | ICD-10-CM

## 2022-09-26 DIAGNOSIS — I10 Essential (primary) hypertension: Secondary | ICD-10-CM

## 2022-09-26 DIAGNOSIS — Z8546 Personal history of malignant neoplasm of prostate: Secondary | ICD-10-CM

## 2022-09-26 DIAGNOSIS — C61 Malignant neoplasm of prostate: Secondary | ICD-10-CM

## 2022-09-26 LAB — CBC WITH DIFFERENTIAL/PLATELET
Basophils Absolute: 0 10*3/uL (ref 0.0–0.1)
Basophils Relative: 0.7 % (ref 0.0–3.0)
Eosinophils Absolute: 0.1 10*3/uL (ref 0.0–0.7)
Eosinophils Relative: 1.2 % (ref 0.0–5.0)
HCT: 46.3 % (ref 39.0–52.0)
Hemoglobin: 16 g/dL (ref 13.0–17.0)
Lymphocytes Relative: 18.6 % (ref 12.0–46.0)
Lymphs Abs: 1 10*3/uL (ref 0.7–4.0)
MCHC: 34.6 g/dL (ref 30.0–36.0)
MCV: 85.5 fl (ref 78.0–100.0)
Monocytes Absolute: 0.5 10*3/uL (ref 0.1–1.0)
Monocytes Relative: 10.3 % (ref 3.0–12.0)
Neutro Abs: 3.7 10*3/uL (ref 1.4–7.7)
Neutrophils Relative %: 69.2 % (ref 43.0–77.0)
Platelets: 277 10*3/uL (ref 150.0–400.0)
RBC: 5.42 Mil/uL (ref 4.22–5.81)
RDW: 13.8 % (ref 11.5–15.5)
WBC: 5.3 10*3/uL (ref 4.0–10.5)

## 2022-09-26 LAB — URINALYSIS, ROUTINE W REFLEX MICROSCOPIC
Bilirubin Urine: NEGATIVE
Hgb urine dipstick: NEGATIVE
Ketones, ur: NEGATIVE
Nitrite: NEGATIVE
RBC / HPF: NONE SEEN (ref 0–?)
Specific Gravity, Urine: 1.02 (ref 1.000–1.030)
Urine Glucose: NEGATIVE
Urobilinogen, UA: 0.2 (ref 0.0–1.0)
pH: 6 (ref 5.0–8.0)

## 2022-09-26 LAB — COMPREHENSIVE METABOLIC PANEL
ALT: 21 U/L (ref 0–53)
AST: 17 U/L (ref 0–37)
Albumin: 4.3 g/dL (ref 3.5–5.2)
Alkaline Phosphatase: 68 U/L (ref 39–117)
BUN: 15 mg/dL (ref 6–23)
CO2: 29 mEq/L (ref 19–32)
Calcium: 9.2 mg/dL (ref 8.4–10.5)
Chloride: 103 mEq/L (ref 96–112)
Creatinine, Ser: 1.09 mg/dL (ref 0.40–1.50)
GFR: 73.38 mL/min (ref >60.00)
Glucose, Bld: 105 mg/dL — ABNORMAL HIGH (ref 70–99)
Potassium: 3.5 mEq/L (ref 3.5–5.1)
Sodium: 140 mEq/L (ref 135–145)
Total Bilirubin: 0.5 mg/dL (ref 0.2–1.2)
Total Protein: 7.3 g/dL (ref 6.0–8.3)

## 2022-09-26 LAB — HEMOGLOBIN A1C: Hgb A1c MFr Bld: 6.2 % (ref 4.6–6.5)

## 2022-09-26 LAB — PSA: PSA: 13.81 ng/mL — ABNORMAL HIGH (ref 0.10–4.00)

## 2022-09-26 LAB — LIPID PANEL
Cholesterol: 219 mg/dL — ABNORMAL HIGH (ref 0–200)
HDL: 41.7 mg/dL (ref >39.00)
NonHDL: 177.59
Total CHOL/HDL Ratio: 5
Triglycerides: 224 mg/dL — ABNORMAL HIGH (ref 0.0–149.0)
VLDL: 44.8 mg/dL — ABNORMAL HIGH (ref 0.0–40.0)

## 2022-09-26 LAB — LDL CHOLESTEROL, DIRECT: Direct LDL: 140 mg/dL

## 2022-09-26 NOTE — Assessment & Plan Note (Signed)
Diet and nutrition discussed.  Advised to decrease amount of daily carbohydrate intake and increase amount of plant based protein in his diet. 

## 2022-09-26 NOTE — Assessment & Plan Note (Signed)
Well-controlled hypertension. Continue amlodipine 5 mg daily. BP Readings from Last 3 Encounters:  09/26/22 122/78  06/20/22 120/84  06/07/22 (!) 137/103

## 2022-09-26 NOTE — Patient Instructions (Signed)
Dolor de espalda agudo en los adultos Acute Back Pain, Adult El dolor de espalda agudo es repentino y por lo general no dura mucho tiempo. Se debe generalmente a una lesin de los msculos y tejidos de la espalda. La lesin puede ser el resultado de: Estiramiento en exceso o desgarro de un msculo, tendn o ligamento. Los ligamentos son tejidos que [employer]. Levantar algo de forma incorrecta puede producir un esguince de espalda. Desgaste (degeneracin) de los discos vertebrales. Los discos vertebrales son tejidos circulares que proporcionan amortiguacin entre los huesos de la columna vertebral (vrtebras). Movimientos de giro, como al practicar deportes o realizar trabajos de [place]. Un golpe en la espalda. Artritis. Es posible Hydrologist un examen fsico, anlisis de laboratorio u otros estudios de diagnstico por imgenes para Animator causa del Social research officer, government. El dolor de espalda agudo generalmente desaparece con reposo y cuidados en la casa. Siga estas instrucciones en su casa: Control del dolor, la rigidez y Forensic psychologist los medicamentos de venta libre y los recetados solamente como se lo haya indicado el mdico. El tratamiento puede incluir medicamentos para Conservation officer, historic buildings y la inflamacin que se toman por la boca o que se aplican sobre la piel, o relajantes musculares. El mdico puede recomendarle que se aplique hielo durante las primeras 24 a 64 horas despus del comienzo del Social research officer, government. Para hacer esto: Ponga el hielo en una bolsa plstica. Coloque una toalla entre la piel y Therapist, nutritional. Aplique el hielo durante 20 minutos, 2 o 3 veces por da. Retire el hielo si la piel se pone de color rojo brillante. Esto es [employer]. Si no puede sentir dolor, calor o fro, tiene un mayor riesgo de que se dae la zona. Si se lo indican, aplique calor en la zona afectada con la frecuencia que le haya indicado el mdico. Use la fuente de calor que el mdico le recomiende, como una compresa de  calor hmedo o una almohadilla trmica. Coloque una toalla entre la piel y la fuente de Freight forwarder. Aplique calor durante 20 a 30 minutos. Retire la fuente de calor si la piel se pone de color rojo brillante. Esto es especialmente importante si no puede sentir dolor, calor o fro. Corre un mayor riesgo de sufrir quemaduras. Actividad  No permanezca en la cama. Hacer reposo en la cama por ms de 1 a 2 das puede demorar su recuperacin. Mantenga una buena postura al sentarse y pararse. No se incline hacia adelante al sentarse ni se encorve al pararse. Si trabaja en un escritorio, sintese cerca de este para no tener que inclinarse. Mantenga el mentn hacia abajo. Mantenga el cuello hacia atrs y los codos flexionados en un ngulo de 90 grados (ngulo recto). Cuando conduzca, sintese elevado y cerca del volante. Agregue un apoyo para la espalda (lumbar) al asiento del automvil, si es necesario. Realice caminatas cortas en superficies planas tan pronto como le sea posible. Trate de caminar un poco ms de Publishing copy. No se siente, conduzca o permanezca de pie en un mismo lugar durante ms de 30 minutos seguidos. Pararse o sentarse durante largos perodos de Radiographer, therapeutic la espalda. No conduzca ni use maquinaria pesada mientras toma analgsicos recetados. Use tcnicas apropiadas para levantar objetos. Cuando se inclina y Chief Executive Officer un [place], utilice posiciones que no sobrecarguen tanto la espalda: [place]. Mantenga la carga cerca del cuerpo. No se tuerza. Haga actividad fsica habitualmente como se lo haya indicado el mdico. Hacer ejercicios ayuda  a que la espalda sane ms rpido y [place] a Product/process development scientist las lesiones de la espalda al [employer] msculos fuertes y flexibles. Trabaje con un fisioterapeuta para crear un programa de ejercicios seguros, segn lo recomiende el mdico. Haga ejercicios como se lo haya indicado el fisioterapeuta. Estilo de vida Mantenga un peso saludable.  El sobrepeso sobrecarga la espalda y hace que resulte difcil tener una buena [place]. Evite actividades o situaciones que lo hagan sentirse ansioso o estresado. El estrs y la ansiedad aumentan la tensin muscular y pueden empeorar el dolor de espalda. Aprenda formas de [employer] ansiedad y [place], como a travs del ejercicio. Instrucciones generales [place] sobre un colchn firme en una posicin cmoda. Intente acostarse de costado, con las rodillas ligeramente flexionadas. Si se recuesta [employer], coloque una almohada debajo de las rodillas. Mantenga la cabeza y el cuello en lnea recta con la columna vertebral (posicin neutra) cuando use equipos electrnicos como telfonos inteligentes o tablets. Para hacer esto: Levante el telfono inteligente o la tablet para Consulting civil engineer de inclinar la cabeza o el cuello para mirar hacia abajo. Coloque el telfono inteligente o la tablet al nivel de su cara mientras mira la pantalla. Siga el plan de tratamiento como se lo haya indicado el mdico. Esto puede incluir: Terapia cognitiva o conductual. Acupuntura o terapia de masajes. Yoga o meditacin. Comunquese con un mdico si: Siente un dolor que no se alivia con reposo o medicamentos. Siente mucho dolor que se extiende a las piernas o las nalgas. El dolor no mejora luego de 2 semanas. Siente dolor por la noche. Pierde peso sin proponrselo. Tiene fiebre o escalofros. Siente nuseas o vmitos. Siente dolor abdominal. Solicite ayuda de inmediato si: Tiene nuevos problemas para controlar la vejiga o los intestinos. Siente debilidad o adormecimiento inusuales en los brazos o en las piernas. Siente que va a desmayarse. Estos sntomas pueden representar un problema grave que constituye Engineer, maintenance (IT). No espere a ver si los sntomas desaparecen. Solicite atencin mdica de inmediato. Comunquese con el servicio de emergencias de su localidad (911 en los [place]). No conduzca por sus  propios medios [employer]. Resumen El dolor de espalda agudo es repentino y por lo general no dura mucho tiempo. Use tcnicas apropiadas para levantar objetos. Cuando se inclina y levanta un [address], utilice posiciones que no sobrecarguen tanto la espalda. Tome los medicamentos de venta libre y los recetados solamente como se lo haya indicado el mdico, y aplquese calor o hielo segn las indicaciones. Esta informacin no tiene Marine scientist el consejo del mdico. Asegrese de hacerle al mdico cualquier pregunta que tenga. Document Revised: 03/31/2021 Document Reviewed: 03/31/2021 Elsevier Patient Education  [place].

## 2022-09-26 NOTE — Progress Notes (Signed)
[facility] 61 y.o.   Chief Complaint  Patient presents with   Back Pain    Mostly on left side    HISTORY OF PRESENT ILLNESS: This is a 61 y.o. male complaining of left-sided lumbar pain. Has history of prostate cancer and kidney stones Recently saw urologist, last week. History of prediabetes, hypertension and dyslipidemia as well. No other complaints or medical concerns today.  Back Pain Pertinent negatives include no abdominal pain, chest pain, fever or headaches.     Prior to Admission medications   Medication Sig Start Date End Date Taking? Authorizing Provider  amLODipine (NORVASC) 5 MG tablet TAKE 1 TABLET(5 MG) BY MOUTH DAILY Patient taking differently: Take 5 mg by mouth daily. 12/21/21  Yes Rayshon Albaugh, Ines Bloomer, MD  clobetasol ointment (TEMOVATE) 2.72 % Apply 1 application  topically 2 (two) times daily as needed (psoriasis). 05/30/22  Yes [provider]  tamsulosin (FLOMAX) 0.4 MG CAPS capsule Take 1 capsule (0.4 mg total) by mouth at bedtime. 05/24/22  Yes Robley Fries, MD    No Known Allergies  Patient Active Problem List   Diagnosis Date Noted   DM2 (diabetes mellitus, type 2) ([place]) 06/01/2022   Obstructive uropathy 05/21/2022   Moderate persistent asthma with acute exacerbation 04/28/2022   History of prostate cancer 04/25/2022   History of kidney stones 04/25/2022   Malignant neoplasm of prostate ([place]) 04/19/2022   Enlarged prostate 01/12/2022   Prediabetes 04/02/2019   Dyslipidemia 12/23/2014   Psoriasis 06/27/2014   Multiple lung nodules 12/09/2011   PULMONARY HYPERTENSION 03/04/2010   Essential hypertension 02/10/2010   OBSTRUCTIVE SLEEP APNEA 02/04/2010    Past Medical History:  Diagnosis Date   Allergy    Flonase, Patanol, Zyrtec   Arthritis    psoriasis   Asthma    seasonal, with allergies   Borderline diabetes    Cluster headache    Colon polyp 10/27/2011   Depression    denies 11/11/16; denies 11/21/18 "been years"    Eczema    GERD (gastroesophageal reflux disease)    History of kidney stones    x1   Hyperlipidemia    Hypertension    Low back pain    "in kidney area-was told that it was related to gallstones"   Prediabetes    Prostate cancer ([place])    Psoriasis    [name]   Severe sleep apnea    h/o; had procedure and weight control, does not have to wear CPAP   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 Repeat 5 years.   CYSTOSCOPY WITH RETROGRADE PYELOGRAM, URETEROSCOPY AND STENT PLACEMENT Left 05/22/2022   Procedure: CYSTOSCOPY WITH RETROGRADE PYELOGRAM, URETEROSCOPY AND STENT PLACEMENT; TRANSURETHRAL RESECTION OF PROSTATE;  Surgeon: Janith Lima, MD;  Location: [facility];  Service: Urology;  Laterality: Left;   CYSTOSCOPY WITH RETROGRADE PYELOGRAM, URETEROSCOPY AND STENT PLACEMENT Left 06/07/2022   Procedure: CYSTOSCOPY WITH RETROGRADE PYELOGRAM, URETEROSCOPY AND STENT EXCHANGE;  Surgeon: Robley Fries, MD;  Location: [facility];  Service: Urology;  Laterality: Left;  90 MINS   EXTRACORPOREAL SHOCK WAVE LITHOTRIPSY Left 05/09/2022   Procedure: EXTRACORPOREAL SHOCK WAVE LITHOTRIPSY (ESWL);  Surgeon: Alexis Frock, MD;  Location: [place];  Service: Urology;  Laterality: Left;   HOLMIUM LASER APPLICATION Left 6/71/2458   Procedure: HOLMIUM LASER APPLICATION;  Surgeon: Robley Fries, MD;  Location: [facility];  Service: Urology;  Laterality: Left;   POLYPECTOMY     PROSTATE BIOPSY  10/2014   will have another in 3 months   SEPTOPLASTY  2014   TONSILLECTOMY  2014   TRANSURETHRAL RESECTION OF PROSTATE N/A 06/07/2022   Procedure: TRANSURETHRAL  RESECTION OF THE PROSTATE (TURP);  Surgeon: Robley Fries, MD;  Location: [facility];  Service: Urology;  Laterality: N/A;   UVULOPALATOPHARYNGOPLASTY  2014    Social History   Socioeconomic History   Marital status: Divorced    Spouse name: Not on file   Number of children: 1   Years of education: Not on file   Highest education level: Doctorate  Occupational History   Occupation: Product manager: [employer]    Comment: Pharmacist, community  Tobacco Use   Smoking status: Never   Smokeless tobacco: Never  Vaping Use   Vaping Use: Never used  Substance and Sexual Activity   Alcohol use: Never    Alcohol/week: 0.0 standard drinks of alcohol   Drug use: Never   Sexual activity: Yes    Comment: SSP  Other Topics Concern   Not on file  Social History Narrative   Original from [place]; moved to [place] 1995.   Marital status: same sexual partner 4 X years.   Children:  1 child/son (18) at 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        Tobacco: never      Alcohol: none      Drugs: none       Exercise: daily in 2018; 10,000 steps daily; lots of walking on campus at work      ADLs: independent with ADLs.        Sexual activity: dating males only in 2018; bisexual.  Previously married to male.       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         Social Determinants of Health   Financial Resource Strain: Not on file  Food Insecurity: Not on file  Transportation Needs: Not on file  Physical Activity: Not on file  Stress: Not on file  Social Connections: Not on file   Intimate Partner Violence: Not on file    Family History  Problem Relation Age of Onset   Stroke Mother    Asthma Sister    Colon cancer Neg Hx    Esophageal cancer Neg Hx    Stomach cancer Neg Hx    Prostate cancer Neg Hx    Diabetes Neg Hx    CAD Neg Hx    Migraines Neg Hx    Headache Neg Hx      Review of Systems  Constitutional: Negative.  Negative for chills and fever.  HENT: Negative.  Negative for congestion and sore throat.   Respiratory: Negative.  Negative for cough and shortness of breath.   Cardiovascular: Negative.  Negative for chest pain and palpitations.  Gastrointestinal: Negative.  Negative for abdominal pain, diarrhea, nausea and vomiting.  Musculoskeletal:  Positive for back pain.  Skin: Negative.  Negative for rash.  Neurological: Negative.  Negative for dizziness and headaches.    Today's Vitals   09/26/22 0932  BP: 122/78  Pulse: 65  Temp: 97.9 F (36.6 C)  TempSrc: Oral  SpO2: 97%  Weight: 185 lb (83.9 kg)  Height: '5\' 6"'$  (1.676 m)   Body mass index is 29.86 kg/m.  Physical Exam Vitals reviewed.  Constitutional:      Appearance: Normal appearance.  HENT:     Head: Normocephalic.  Eyes:     Extraocular Movements: Extraocular movements intact.     Pupils: Pupils are equal, round, and reactive to light.  Cardiovascular:     Rate and Rhythm: Normal rate and regular rhythm.     Pulses: Normal pulses.     Heart sounds: Normal heart sounds.  Pulmonary:     Effort: Pulmonary effort is normal.     Breath sounds: Normal breath sounds.  Abdominal:     Palpations: Abdomen is soft.     Tenderness: There is no abdominal tenderness.  Musculoskeletal:     Lumbar back: Spasms and tenderness present. No bony tenderness. Negative right straight leg raise test and negative left straight leg raise test.     Right lower leg: No edema.     Left lower leg: Edema present.  Skin:    General: Skin is warm and dry.  Neurological:     General: No focal  deficit present.     Mental Status: He is alert and oriented to person, place, and time.  Psychiatric:        Mood and Affect: Mood normal.        Behavior: Behavior normal.    DG Lumbar Spine 2-3 Views  Result Date: 09/26/2022 CLINICAL DATA:  Lumbar pain for 2 weeks EXAM: LUMBAR SPINE - 2-3 VIEW COMPARISON:  Lumbar radiographs 05/19/2016, CT abdomen/pelvis 06/02/2022 FINDINGS: There are 5 non-rib-bearing lumbar-type vertebral bodies. Vertebral body heights are preserved, without evidence of acute injury. The disc heights are preserved. There is minimal degenerative endplate change and facet arthropathy in the lumbar spine. The SI joints are intact.  The soft tissues are unremarkable. IMPRESSION: No evidence of acute injury.  Minimal degenerative changes. Electronically Signed   By: Valetta Mole M.D.   On: 09/26/2022 11:00     ASSESSMENT & PLAN: A total of 46 minutes was spent with the patient and counseling/coordination of care regarding preparing for this visit, review of most recent office visit notes, review of most recent blood work results, reviews of today lumbar spine x-ray report, review of multiple chronic medical problems and their management, review of all medications, education on nutrition, prognosis, documentation, need for follow-up.  Problem List Items Addressed This Visit       Cardiovascular and Mediastinum   Essential hypertension    Well-controlled hypertension. Continue amlodipine 5 mg daily. BP Readings from Last 3 Encounters:  09/26/22 122/78  06/20/22 120/84  06/07/22 (!) 137/103         Relevant Orders   Comprehensive metabolic panel   CBC with Differential/Platelet     Genitourinary   Malignant neoplasm of prostate (HCC)    Stable.  History of chronic PSA elevation. Scheduled for surveillance biopsy next month. Continues to take Flomax 0.4 mg daily        Other   Musculoskeletal back pain - Primary    Mechanical  in nature.  Review of most recent  CT scan of abdomen and pelvis and kidney stone CT do not reveal any osseous abnormalities of the lumbar spine. May use Tylenol and or Advil as needed for pain. X-rays show mild degenerative changes of lumbar spine. No red flag signs or symptoms.      Relevant Orders   DG Lumbar Spine 2-3 Views (Completed)   Dyslipidemia    Stable.  Diet and nutrition discussed.      Relevant Orders   Lipid panel   CBC with Differential/Platelet   Prediabetes    Diet and nutrition discussed. Advised to decrease amount of daily carbohydrate intake and increase amount of plant based protein in his diet.      Relevant Orders   CBC with Differential/Platelet   Hemoglobin A1c   History of prostate cancer   Relevant Orders   PSA   CEA   History of kidney stones   Relevant Orders   Urinalysis   Patient Instructions  Dolor de espalda agudo en los adultos Acute Back Pain, Adult El dolor de espalda agudo es repentino y por lo general no dura mucho tiempo. Se debe generalmente a una lesin de los msculos y tejidos de la espalda. La lesin puede ser el resultado de: Estiramiento en exceso o desgarro de un msculo, tendn o ligamento. Los ligamentos son tejidos que [employer]. Levantar algo de forma incorrecta puede producir un esguince de espalda. Desgaste (degeneracin) de los discos vertebrales. Los discos vertebrales son tejidos circulares que proporcionan amortiguacin entre los huesos de la columna vertebral (vrtebras). Movimientos de giro, como al practicar deportes o realizar trabajos de [place]. Un golpe en la espalda. Artritis. Es posible Hydrologist un examen fsico, anlisis de laboratorio u otros estudios de diagnstico por imgenes para Animator causa del Social research officer, government. El dolor de espalda agudo generalmente desaparece con reposo y cuidados en la casa. Siga estas instrucciones en su casa: Control del dolor, la rigidez y Forensic psychologist los medicamentos de venta libre y los  recetados solamente como se lo haya indicado el mdico. El tratamiento puede incluir medicamentos para Conservation officer, historic buildings y la inflamacin que se toman por la boca o que se aplican sobre la piel, o relajantes musculares. El mdico puede recomendarle que se aplique hielo durante las primeras 24 a 68 horas despus del comienzo del Social research officer, government. Para hacer esto: Ponga el hielo en una bolsa plstica. Coloque una toalla entre la piel y Therapist, nutritional. Aplique el hielo durante 20 minutos, 2 o 3 veces por da. Retire el hielo si la piel se pone de color rojo brillante. Esto es [employer]. Si no puede sentir dolor, calor o fro, tiene un mayor riesgo de que se dae la zona. Si se lo indican, aplique calor en la zona afectada con la frecuencia que le haya indicado el mdico. Use la fuente de calor que el mdico le recomiende, como una compresa de calor hmedo o una almohadilla trmica. Coloque una toalla entre la piel y la fuente de Freight forwarder. Aplique calor durante 20 a 30 minutos. Retire la fuente de calor si la piel se pone de color rojo brillante. Esto es especialmente importante si no puede sentir dolor, calor o fro. Corre un mayor riesgo de sufrir quemaduras. Actividad  No permanezca en la cama. Hacer reposo en la cama por ms de 1 a 2 das puede demorar su recuperacin. Mantenga una buena postura al sentarse y pararse. No se incline hacia adelante al  sentarse ni se encorve al pararse. Si trabaja en un escritorio, sintese cerca de este para no tener que inclinarse. Mantenga el mentn hacia abajo. Mantenga el cuello hacia atrs y los codos flexionados en un ngulo de 90 grados (ngulo recto). Cuando conduzca, sintese elevado y cerca del volante. Agregue un apoyo para la espalda (lumbar) al asiento del automvil, si es necesario. Realice caminatas cortas en superficies planas tan pronto como le sea posible. Trate de caminar un poco ms de Publishing copy. No se siente, conduzca o permanezca de pie en un mismo lugar durante  ms de 30 minutos seguidos. Pararse o sentarse durante largos perodos de Radiographer, therapeutic la espalda. No conduzca ni use maquinaria pesada mientras toma analgsicos recetados. Use tcnicas apropiadas para levantar objetos. Cuando se inclina y Chief Executive Officer un [place], utilice posiciones que no sobrecarguen tanto la espalda: [place]. Mantenga la carga cerca del cuerpo. No se tuerza. Haga actividad fsica habitualmente como se lo haya indicado el mdico. Hacer ejercicios ayuda a que la espalda sane ms rpido y [place] a Product/process development scientist las lesiones de la espalda al [employer] msculos fuertes y flexibles. Trabaje con un fisioterapeuta para crear un programa de ejercicios seguros, segn lo recomiende el mdico. Haga ejercicios como se lo haya indicado el fisioterapeuta. Estilo de vida Mantenga un peso saludable. El sobrepeso sobrecarga la espalda y hace que resulte difcil tener una buena [place]. Evite actividades o situaciones que lo hagan sentirse ansioso o estresado. El estrs y la ansiedad aumentan la tensin muscular y pueden empeorar el dolor de espalda. Aprenda formas de [employer] ansiedad y [place], como a travs del ejercicio. Instrucciones generales [place] sobre un colchn firme en una posicin cmoda. Intente acostarse de costado, con las rodillas ligeramente flexionadas. Si se recuesta [employer], coloque una almohada debajo de las rodillas. Mantenga la cabeza y el cuello en lnea recta con la columna vertebral (posicin neutra) cuando use equipos electrnicos como telfonos inteligentes o tablets. Para hacer esto: Levante el telfono inteligente o la tablet para Consulting civil engineer de inclinar la cabeza o el cuello para mirar hacia abajo. Coloque el telfono inteligente o la tablet al nivel de su cara mientras mira la pantalla. Siga el plan de tratamiento como se lo haya indicado el mdico. Esto puede incluir: Terapia cognitiva o conductual. Acupuntura o terapia de  masajes. Yoga o meditacin. Comunquese con un mdico si: Siente un dolor que no se alivia con reposo o medicamentos. Siente mucho dolor que se extiende a las piernas o las nalgas. El dolor no mejora luego de 2 semanas. Siente dolor por la noche. Pierde peso sin proponrselo. Tiene fiebre o escalofros. Siente nuseas o vmitos. Siente dolor abdominal. Solicite ayuda de inmediato si: Tiene nuevos problemas para controlar la vejiga o los intestinos. Siente debilidad o adormecimiento inusuales en los brazos o en las piernas. Siente que va a desmayarse. Estos sntomas pueden representar un problema grave que constituye Engineer, maintenance (IT). No espere a ver si los sntomas desaparecen. Solicite atencin mdica de inmediato. Comunquese con el servicio de emergencias de su localidad (911 en los [place]). No conduzca por sus propios medios Principal Financial. Resumen El dolor de espalda agudo es repentino y por lo general no dura mucho tiempo. Use tcnicas apropiadas para levantar objetos. Cuando se inclina y levanta un [place], utilice posiciones que no sobrecarguen tanto la espalda. Tome los medicamentos de venta libre y los recetados solamente como se lo haya indicado el mdico, y  aplquese calor o hielo segn las indicaciones. Esta informacin no tiene Marine scientist el consejo del mdico. Asegrese de hacerle al mdico cualquier pregunta que tenga. Document Revised: 03/31/2021 Document Reviewed: 03/31/2021 Elsevier Patient Education  [place], MD [facility]

## 2022-09-26 NOTE — Assessment & Plan Note (Signed)
Mechanical in nature.  Review of most recent CT scan of abdomen and pelvis and kidney stone CT do not reveal any osseous abnormalities of the lumbar spine. May use Tylenol and or Advil as needed for pain. X-rays show mild degenerative changes of lumbar spine. No red flag signs or symptoms.

## 2022-09-26 NOTE — Assessment & Plan Note (Signed)
Stable.  Diet and nutrition discussed. 

## 2022-09-26 NOTE — Assessment & Plan Note (Signed)
Stable.  History of chronic PSA elevation. Scheduled for surveillance biopsy next month. Continues to take Flomax 0.4 mg daily

## 2022-09-27 ENCOUNTER — Encounter: Payer: Self-pay | Admitting: Emergency Medicine

## 2022-09-27 LAB — CEA: CEA: <2 ng/mL

## 2022-10-10 ENCOUNTER — Other Ambulatory Visit: Payer: Self-pay | Admitting: Emergency Medicine

## 2022-10-26 ENCOUNTER — Encounter: Payer: Self-pay | Admitting: Emergency Medicine

## 2022-10-26 ENCOUNTER — Ambulatory Visit: Payer: BC Managed Care – PPO | Admitting: Emergency Medicine

## 2022-10-26 VITALS — BP 118/82 | HR 70 | Temp 98.4°F | Ht 66.0 in | Wt 183.4 lb

## 2022-10-26 DIAGNOSIS — Z23 Encounter for immunization: Secondary | ICD-10-CM | POA: Diagnosis not present

## 2022-10-26 DIAGNOSIS — E785 Hyperlipidemia, unspecified: Secondary | ICD-10-CM | POA: Diagnosis not present

## 2022-10-26 DIAGNOSIS — I10 Essential (primary) hypertension: Secondary | ICD-10-CM | POA: Diagnosis not present

## 2022-10-26 DIAGNOSIS — Z8546 Personal history of malignant neoplasm of prostate: Secondary | ICD-10-CM

## 2022-10-26 DIAGNOSIS — R7303 Prediabetes: Secondary | ICD-10-CM

## 2022-10-26 DIAGNOSIS — Z1211 Encounter for screening for malignant neoplasm of colon: Secondary | ICD-10-CM | POA: Diagnosis not present

## 2022-10-26 MED ORDER — ROSUVASTATIN CALCIUM 10 MG PO TABS: 10.0000 mg | ORAL_TABLET | Freq: Every day | ORAL | 3 refills | Status: DC

## 2022-10-26 NOTE — Assessment & Plan Note (Signed)
Well-controlled hypertension. Continue amlodipine 5 mg daily. BP Readings from Last 3 Encounters:  10/26/22 118/82  09/26/22 122/78  06/20/22 120/84

## 2022-10-26 NOTE — Progress Notes (Signed)
[facility] 61 y.o.   Chief Complaint  Patient presents with   Follow-up    87mth f/u appt,  patient had CT scan done at urology wanting to talk about the results    HISTORY OF PRESENT ILLNESS: This is a 61y.o. male here for 627-monthollow-up of hypertension and dyslipidemia. Has history of prostate cancer.  Sees urologist on a regular basis. Most recent PSA trending down.  Recent CT scan without concerns. Overall doing well. Has no complaints or any other medical concerns today.  HPI   Prior to Admission medications   Medication Sig Start Date End Date Taking? Authorizing Provider  amLODipine (NORVASC) 5 MG tablet TAKE 1 TABLET(5 MG) BY MOUTH DAILY 12/21/21  Yes Zane Pellecchia, MiInes BloomerMD  clobetasol ointment (TEMOVATE) 0.5.80 Apply 1 application  topically 2 (two) times daily as needed (psoriasis). 05/30/22  Yes [provider]  tamsulosin (FLOMAX) 0.4 MG CAPS capsule Take 1 capsule (0.4 mg total) by mouth at bedtime. 05/24/22  Yes PaRobley FriesMD    No Known Allergies  Patient Active Problem List   Diagnosis Date Noted   DM2 (diabetes mellitus, type 2) (HCCrandall06/06/2022   Obstructive uropathy 05/21/2022   Moderate persistent asthma with acute exacerbation 04/28/2022   History of prostate cancer 04/25/2022   History of kidney stones 04/25/2022   Malignant neoplasm of prostate (HCGreenevers04/25/2023   Enlarged prostate 01/12/2022   Prediabetes 04/02/2019   Dyslipidemia 12/23/2014   Psoriasis 06/27/2014   Multiple lung nodules 12/09/2011   Musculoskeletal back pain 06/10/2010   PULMONARY HYPERTENSION 03/04/2010   Essential hypertension 02/10/2010   OBSTRUCTIVE SLEEP APNEA 02/04/2010    Past Medical History:  Diagnosis Date   Allergy    Flonase, Patanol, Zyrtec   Arthritis    psoriasis   Asthma    seasonal, with allergies   Borderline diabetes    Cluster headache    Colon polyp 10/27/2011   Depression    denies 11/11/16; denies 11/21/18 "been years"    Eczema    GERD (gastroesophageal reflux disease)    History of kidney stones    x1   Hyperlipidemia    Hypertension    Low back pain    "in kidney area-was told that it was related to gallstones"   Prediabetes    Prostate cancer (HCFive Points   Psoriasis    [name]   Severe sleep apnea    h/o; had procedure and weight control, does not have to wear CPAP   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 Repeat 5 years.   CYSTOSCOPY WITH RETROGRADE PYELOGRAM, URETEROSCOPY AND STENT PLACEMENT Left 05/22/2022   Procedure: CYSTOSCOPY WITH RETROGRADE PYELOGRAM, URETEROSCOPY AND STENT PLACEMENT; TRANSURETHRAL RESECTION OF PROSTATE;  Surgeon: GaJanith LimaMD;  Location: [facility];  Service: Urology;  Laterality: Left;   CYSTOSCOPY WITH RETROGRADE PYELOGRAM, URETEROSCOPY AND STENT PLACEMENT Left 06/07/2022   Procedure: CYSTOSCOPY WITH RETROGRADE PYELOGRAM, URETEROSCOPY AND STENT EXCHANGE;  Surgeon: PaRobley FriesMD;  Location: [facility];  Service: Urology;  Laterality: Left;  90 MINS   EXTRACORPOREAL SHOCK WAVE LITHOTRIPSY Left 05/09/2022   Procedure: EXTRACORPOREAL SHOCK WAVE LITHOTRIPSY (ESWL);  Surgeon: Alexis Frock, MD;  Location: [facility];  Service: Urology;  Laterality: Left;   HOLMIUM LASER APPLICATION Left 5/80/9983   Procedure: HOLMIUM LASER APPLICATION;  Surgeon: Robley Fries, MD;  Location: [facility];  Service: Urology;  Laterality: Left;   POLYPECTOMY     PROSTATE BIOPSY  10/2014   will have another in 3 months   SEPTOPLASTY  2014   TONSILLECTOMY  2014   TRANSURETHRAL RESECTION OF PROSTATE N/A 06/07/2022   Procedure: TRANSURETHRAL  RESECTION OF THE PROSTATE (TURP);  Surgeon: Robley Fries, MD;  Location: [facility];  Service: Urology;  Laterality: N/A;   UVULOPALATOPHARYNGOPLASTY  2014    Social History   Socioeconomic History   Marital status: Divorced    Spouse name: Not on file   Number of children: 1   Years of education: Not on file   Highest education level: Doctorate  Occupational History   Occupation: Product manager: [employer]    Comment: Pharmacist, community  Tobacco Use   Smoking status: Never   Smokeless tobacco: Never  Vaping Use   Vaping Use: Never used  Substance and Sexual Activity   Alcohol use: Never    Alcohol/week: 0.0 standard drinks of alcohol   Drug use: Never   Sexual activity: Yes    Comment: SSP  Other Topics Concern   Not on file  Social History Narrative   Original from [place]; moved to [place] 1995.   Marital status: same sexual partner 4 X years.   Children:  1 child/son (18) at 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        Tobacco: never      Alcohol: none      Drugs: none       Exercise: daily in 2018; 10,000 steps daily; lots of walking on campus at work      ADLs: independent with ADLs.        Sexual activity: dating males only in 2018; bisexual.  Previously married to male.       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         Social Determinants of Health   Financial Resource Strain: Not on file  Food Insecurity: Not on file  Transportation Needs: Not on file  Physical Activity: Not on file  Stress: Not on file  Social Connections: Not on file   Intimate Partner Violence: Not on file    Family History  Problem Relation Age of Onset   Stroke Mother    Asthma Sister    Colon cancer Neg Hx    Esophageal cancer Neg Hx    Stomach cancer Neg Hx    Prostate cancer Neg Hx    Diabetes Neg Hx    CAD Neg Hx    Migraines Neg Hx    Headache Neg Hx      Review of Systems  Constitutional: Negative.  Negative for chills and fever.  HENT: Negative.  Negative for congestion and sore throat.   Respiratory: Negative.  Negative for cough and shortness of breath.   Cardiovascular: Negative.  Negative  for chest pain and palpitations.  Gastrointestinal: Negative.  Negative for abdominal pain, diarrhea, nausea and vomiting.  Genitourinary: Negative.  Negative for dysuria and hematuria.  Musculoskeletal:  Positive for back pain.  Skin: Negative.  Negative for rash.  Neurological:  Negative for dizziness and headaches.  All other systems reviewed and are negative.  Today's Vitals   10/26/22 1340  BP: 118/82  Pulse: 70  Temp: 98.4 F (36.9 C)  TempSrc: Oral  SpO2: 96%  Weight: 183 lb 6 oz (83.2 kg)  Height: '5\' 6"'$  (1.676 m)   Body mass index is 29.6 kg/m.   Physical Exam Vitals reviewed.  Constitutional:      Appearance: Normal appearance.  HENT:     Head: Normocephalic.     Mouth/Throat:     Mouth: Mucous membranes are moist.     Pharynx: Oropharynx is clear.  Eyes:     Extraocular Movements: Extraocular movements intact.     Conjunctiva/sclera: Conjunctivae normal.     Pupils: Pupils are equal, round, and reactive to light.  Cardiovascular:     Rate and Rhythm: Normal rate and regular rhythm.     Pulses: Normal pulses.     Heart sounds: Normal heart sounds.  Pulmonary:     Effort: Pulmonary effort is normal.     Breath sounds: Normal breath sounds.  Abdominal:     Palpations: Abdomen is soft.     Tenderness: There is no abdominal tenderness.  Skin:    General: Skin is warm and dry.  Neurological:     General: No  focal deficit present.     Mental Status: He is alert and oriented to person, place, and time.  Psychiatric:        Mood and Affect: Mood normal.        Behavior: Behavior normal.     ASSESSMENT & PLAN: A total of 44 minutes was spent with the patient and counseling/coordination of care regarding preparing for this visit, review of most recent office visit notes, review of most recent blood work results, review of multiple chronic medical problems and their management, review of all medications and changes made, education on nutrition, prognosis, documentation, and need for follow-up.  Problem List Items Addressed This Visit       Cardiovascular and Mediastinum   Essential hypertension - Primary    Well-controlled hypertension. Continue amlodipine 5 mg daily. BP Readings from Last 3 Encounters:  10/26/22 118/82  09/26/22 122/78  06/20/22 120/84         Relevant Medications   rosuvastatin (CRESTOR) 10 MG tablet     Other   Dyslipidemia    Elevated triglycerides and total cholesterol Lab Results  Component Value Date   CHOL 219 (H) 09/26/2022   HDL 41.70 09/26/2022   LDLCALC 44 01/12/2022   LDLDIRECT 140.0 09/26/2022   TRIG 224.0 (H) 09/26/2022   CHOLHDL 5 09/26/2022   The 10-year ASCVD risk score (Arnett DK, et al., 2019) is: 21.1%   Values used to calculate the score:     Age: 65 years     Sex: Male     Is Non-Hispanic African American: No     Diabetic: Yes     Tobacco smoker: No     Systolic Blood Pressure: 962 mmHg     Is BP treated: Yes     HDL Cholesterol: 41.7 mg/dL     Total Cholesterol: 219 mg/dL Normal liver enzymes. Recommend to start rosuvastatin 10 mg daily  Relevant Medications   rosuvastatin (CRESTOR) 10 MG tablet   Prediabetes    Diet and nutrition discussed. Advised to decrease amount of daily carbohydrate intake and daily calories and increase amount of plant based protein in his diet.      History of prostate cancer    Prostate  cancer in situ.  Under surveillance. Follows up with urologist on a regular basis.      Other Visit Diagnoses     Colon cancer screening       Relevant Orders   Ambulatory referral to Gastroenterology   Need for vaccination       Relevant Orders   Flu Vaccine QUAD 6+ mos PF IM (Fluarix Quad PF)   Zoster Recombinant (Shingrix )      Patient Instructions  Mantenimiento de Technical sales engineer en los hombres Health Maintenance, Male Adoptar un estilo de vida saludable y recibir atencin preventiva son importantes para promover la salud y Musician. Consulte al mdico sobre: El esquema adecuado para hacerse pruebas y exmenes peridicos. Cosas que puede hacer por su cuenta para prevenir enfermedades y Lawrence sano. Qu debo saber sobre la dieta, el peso y el ejercicio? Consuma una dieta saludable  Consuma una dieta que incluya muchas verduras, frutas, productos lcteos con bajo contenido de [place] y Advertising account planner. No consuma muchos alimentos ricos en grasas slidas, azcares agregados o sodio. Mantenga un peso saludable El ndice de masa muscular [facility]) es una medida que puede utilizarse para identificar posibles problemas de [place]. Proporciona una estimacin de la grasa corporal basndose en el peso y la altura. Su mdico puede ayudarle a Radiation protection practitioner [place] y a Scientist, forensic o Theatre manager un peso saludable. Haga ejercicio con regularidad Haga ejercicio con regularidad. Esta es una de las prcticas ms importantes que puede hacer por su salud. [employer] de los adultos deben seguir estas pautas: Optometrist, al menos, 150 minutos de actividad fsica por semana. El ejercicio debe aumentar la frecuencia cardaca y Nature conservation officer transpirar (ejercicio de intensidad moderada). Hacer ejercicios de fortalecimiento por lo [employer] por semana. Agregue esto a su plan de ejercicio de intensidad moderada. Pase menos tiempo sentado. Incluso la actividad fsica ligera puede ser beneficiosa. Controle sus niveles de  colesterol y lpidos en la sangre Comience a realizarse anlisis de lpidos y Research officer, trade union en la sangre a los 12 aos y luego reptalos cada 5 aos. Es posible que Automotive engineer los niveles de colesterol con mayor frecuencia si: Sus niveles de lpidos y colesterol son altos. Es mayor de 64 aos. Presenta un alto riesgo de padecer enfermedades cardacas. Qu debo saber sobre las pruebas de deteccin del cncer? Muchos tipos de cncer pueden detectarse de manera temprana y, a menudo, pueden prevenirse. Segn su historia clnica y sus antecedentes familiares, es posible que deba realizarse pruebas de deteccin del cncer en diferentes edades. Esto puede incluir pruebas de deteccin de lo siguiente: Surveyor, minerals. Cncer de prstata. Cncer de piel. Cncer de pulmn. Qu debo saber sobre la enfermedad cardaca, la diabetes y la hipertensin arterial? Presin arterial y enfermedad cardaca La hipertensin arterial causa enfermedades cardacas y [place] el riesgo de accidente cerebrovascular. Es ms probable que esto se manifieste en las personas que tienen lecturas de presin arterial alta o tienen sobrepeso. Hable con el mdico sobre sus valores de presin arterial deseados. Hgase controlar la presin arterial: Cada 3 a 5 aos si tiene entre 18 y 40 aos. Todos los aos si es mayor de 40 aos. Si tiene entre 65  y 33 aos y es fumador o Insurance account manager, pregntele al mdico si debe realizarse una prueba de deteccin de aneurisma artico abdominal (AAA) por nica vez. Diabetes Realcese exmenes de deteccin de la diabetes con regularidad. Este anlisis revisa el nivel de azcar en la sangre en [place]. Hgase las pruebas de deteccin: Cada tres aos despus de los 9 aos de edad si tiene un peso normal y un bajo riesgo de padecer diabetes. Con ms frecuencia y a partir de [place] edad inferior si tiene sobrepeso o un alto riesgo de padecer diabetes. Qu debo saber sobre la prevencin de  infecciones? Hepatitis B Si tiene un riesgo ms alto de contraer hepatitis B, debe someterse a un examen de deteccin de este virus. Hable con el mdico para averiguar si tiene riesgo de contraer la infeccin por hepatitis B. Hepatitis C Se recomienda un anlisis de [place] para: Todos los que nacieron entre 1945 y [id number]. Todas las personas que tengan un riesgo de haber contrado hepatitis C. Enfermedades de transmisin sexual (ETS) Debe realizarse pruebas de deteccin de ITS todos los aos, incluidas la gonorrea y la clamidia, si: Es sexualmente activo y es menor de 68 aos. Es mayor de 73 aos, y Investment banker, operational informa que corre riesgo de tener este tipo de infecciones. La actividad sexual ha cambiado desde que le hicieron la ltima prueba de deteccin y tiene un riesgo mayor de Best boy clamidia o Radio broadcast assistant. Pregntele al mdico si usted tiene riesgo. Pregntele al mdico si usted tiene un alto riesgo de Museum/gallery curator VIH. El mdico tambin puede recomendarle un medicamento recetado para ayudar a evitar la infeccin por el VIH. Si elige tomar medicamentos para prevenir el VIH, primero debe [employer] de deteccin del VIH. Luego debe hacerse anlisis cada 3 meses mientras est tomando los medicamentos. Siga estas indicaciones en su casa: Consumo de alcohol No beba alcohol si el mdico se lo prohbe. Si bebe alcohol: Limite la cantidad que consume de 0 a 2 bebidas por da. Sepa cunta cantidad de alcohol hay en las bebidas que toma. En los [place], una medida equivale a una botella de cerveza de 12 oz (355 ml), un vaso de vino de 5 oz (148 ml) o un vaso de una bebida alcohlica de alta graduacin de 1 oz (44 ml). Estilo de vida No consuma ningn producto que contenga nicotina o tabaco. Estos productos incluyen cigarrillos, tabaco para Higher education careers adviser y aparatos de vapeo, como los Psychologist, sport and exercise. Si necesita ayuda para dejar de consumir estos productos, consulte al mdico. No consuma  drogas. No comparta agujas. Solicite ayuda a su mdico si necesita apoyo o informacin para abandonar las drogas. Indicaciones generales Realcese los estudios de rutina de la salud, dentales y de Public librarian. [place]. Infrmele a su mdico si: Se siente deprimido con frecuencia. Alguna vez ha sido vctima de [place] o no se siente seguro en su casa. Resumen Adoptar un estilo de vida saludable y recibir atencin preventiva son importantes para promover la salud y Musician. Siga las instrucciones del mdico acerca de una dieta saludable, el ejercicio y la realizacin de pruebas o exmenes para Engineer, building services. Siga las instrucciones del mdico con respecto al control del colesterol y la presin arterial. Esta informacin no tiene Marine scientist el consejo del mdico. Asegrese de hacerle al mdico cualquier pregunta que tenga. Document Revised: 05/19/2021 Document Reviewed: 05/19/2021 Elsevier Patient Education  2023 Elsevier Inc.    Agustina Caroli, MD [facility]

## 2022-10-26 NOTE — Patient Instructions (Signed)
Mantenimiento de la salud en los hombres Health Maintenance, Male Adoptar un estilo de vida saludable y recibir atencin preventiva son importantes para promover la salud y el bienestar. Consulte al mdico sobre: El esquema adecuado para hacerse pruebas y exmenes peridicos. Cosas que puede hacer por su cuenta para prevenir enfermedades y mantenerse sano. Qu debo saber sobre la dieta, el peso y el ejercicio? Consuma una dieta saludable  Consuma una dieta que incluya muchas verduras, frutas, productos lcteos con bajo contenido de grasa y protenas magras. No consuma muchos alimentos ricos en grasas slidas, azcares agregados o sodio. Mantenga un peso saludable El ndice de masa muscular (IMC) es una medida que puede utilizarse para identificar posibles problemas de peso. Proporciona una estimacin de la grasa corporal basndose en el peso y la altura. Su mdico puede ayudarle a determinar su IMC y a lograr o mantener un peso saludable. Haga ejercicio con regularidad Haga ejercicio con regularidad. Esta es una de las prcticas ms importantes que puede hacer por su salud. La mayora de los adultos deben seguir estas pautas: Realizar, al menos, 150 minutos de actividad fsica por semana. El ejercicio debe aumentar la frecuencia cardaca y hacerlo transpirar (ejercicio de intensidad moderada). Hacer ejercicios de fortalecimiento por lo menos dos veces por semana. Agregue esto a su plan de ejercicio de intensidad moderada. Pase menos tiempo sentado. Incluso la actividad fsica ligera puede ser beneficiosa. Controle sus niveles de colesterol y lpidos en la sangre Comience a realizarse anlisis de lpidos y colesterol en la sangre a los 20 aos y luego reptalos cada 5 aos. Es posible que necesite controlar los niveles de colesterol con mayor frecuencia si: Sus niveles de lpidos y colesterol son altos. Es mayor de 40 aos. Presenta un alto riesgo de padecer enfermedades cardacas. Qu debo  saber sobre las pruebas de deteccin del cncer? Muchos tipos de cncer pueden detectarse de manera temprana y, a menudo, pueden prevenirse. Segn su historia clnica y sus antecedentes familiares, es posible que deba realizarse pruebas de deteccin del cncer en diferentes edades. Esto puede incluir pruebas de deteccin de lo siguiente: Cncer colorrectal. Cncer de prstata. Cncer de piel. Cncer de pulmn. Qu debo saber sobre la enfermedad cardaca, la diabetes y la hipertensin arterial? Presin arterial y enfermedad cardaca La hipertensin arterial causa enfermedades cardacas y aumenta el riesgo de accidente cerebrovascular. Es ms probable que esto se manifieste en las personas que tienen lecturas de presin arterial alta o tienen sobrepeso. Hable con el mdico sobre sus valores de presin arterial deseados. Hgase controlar la presin arterial: Cada 3 a 5 aos si tiene entre 18 y 39 aos. Todos los aos si es mayor de 40 aos. Si tiene entre 65 y 75 aos y es fumador o sola fumar, pregntele al mdico si debe realizarse una prueba de deteccin de aneurisma artico abdominal (AAA) por nica vez. Diabetes Realcese exmenes de deteccin de la diabetes con regularidad. Este anlisis revisa el nivel de azcar en la sangre en ayunas. Hgase las pruebas de deteccin: Cada tres aos despus de los 45 aos de edad si tiene un peso normal y un bajo riesgo de padecer diabetes. Con ms frecuencia y a partir de una edad inferior si tiene sobrepeso o un alto riesgo de padecer diabetes. Qu debo saber sobre la prevencin de infecciones? Hepatitis B Si tiene un riesgo ms alto de contraer hepatitis B, debe someterse a un examen de deteccin de este virus. Hable con el mdico para averiguar si tiene riesgo de   contraer la infeccin por hepatitis B. Hepatitis C Se recomienda un anlisis de sangre para: Todos los que nacieron entre 1945 y 1965. Todas las personas que tengan un riesgo de haber  contrado hepatitis C. Enfermedades de transmisin sexual (ETS) Debe realizarse pruebas de deteccin de ITS todos los aos, incluidas la gonorrea y la clamidia, si: Es sexualmente activo y es menor de 24 aos. Es mayor de 24 aos, y el mdico le informa que corre riesgo de tener este tipo de infecciones. La actividad sexual ha cambiado desde que le hicieron la ltima prueba de deteccin y tiene un riesgo mayor de tener clamidia o gonorrea. Pregntele al mdico si usted tiene riesgo. Pregntele al mdico si usted tiene un alto riesgo de contraer VIH. El mdico tambin puede recomendarle un medicamento recetado para ayudar a evitar la infeccin por el VIH. Si elige tomar medicamentos para prevenir el VIH, primero debe hacerse los anlisis de deteccin del VIH. Luego debe hacerse anlisis cada 3 meses mientras est tomando los medicamentos. Siga estas indicaciones en su casa: Consumo de alcohol No beba alcohol si el mdico se lo prohbe. Si bebe alcohol: Limite la cantidad que consume de 0 a 2 bebidas por da. Sepa cunta cantidad de alcohol hay en las bebidas que toma. En los [place], una medida equivale a una botella de cerveza de 12 oz (355 ml), un vaso de vino de 5 oz (148 ml) o un vaso de una bebida alcohlica de alta graduacin de 1 oz (44 ml). Estilo de vida No consuma ningn producto que contenga nicotina o tabaco. Estos productos incluyen cigarrillos, tabaco para mascar y aparatos de vapeo, como los cigarrillos electrnicos. Si necesita ayuda para dejar de consumir estos productos, consulte al mdico. No consuma drogas. No comparta agujas. Solicite ayuda a su mdico si necesita apoyo o informacin para abandonar las drogas. Indicaciones generales Realcese los estudios de rutina de la salud, dentales y de la vista. Mantngase al da con las vacunas. Infrmele a su mdico si: Se siente deprimido con frecuencia. Alguna vez ha sido vctima de maltrato o no se siente seguro en su  casa. Resumen Adoptar un estilo de vida saludable y recibir atencin preventiva son importantes para promover la salud y el bienestar. Siga las instrucciones del mdico acerca de una dieta saludable, el ejercicio y la realizacin de pruebas o exmenes para detectar enfermedades. Siga las instrucciones del mdico con respecto al control del colesterol y la presin arterial. Esta informacin no tiene como fin reemplazar el consejo del mdico. Asegrese de hacerle al mdico cualquier pregunta que tenga. Document Revised: 05/19/2021 Document Reviewed: 05/19/2021 Elsevier Patient Education  2023 Elsevier Inc.  

## 2022-10-26 NOTE — Assessment & Plan Note (Signed)
Diet and nutrition discussed.  Advised to decrease amount of daily carbohydrate intake and daily calories and increase amount of plant based protein in his diet 

## 2022-10-26 NOTE — Assessment & Plan Note (Signed)
Elevated triglycerides and total cholesterol Lab Results  Component Value Date   CHOL 219 (H) 09/26/2022   HDL 41.70 09/26/2022   LDLCALC 44 01/12/2022   LDLDIRECT 140.0 09/26/2022   TRIG 224.0 (H) 09/26/2022   CHOLHDL 5 09/26/2022   The 10-year ASCVD risk score (Arnett DK, et al., 2019) is: 21.1%   Values used to calculate the score:     Age: 61 years     Sex: Male     Is Non-Hispanic African American: No     Diabetic: Yes     Tobacco smoker: No     Systolic Blood Pressure: 799 mmHg     Is BP treated: Yes     HDL Cholesterol: 41.7 mg/dL     Total Cholesterol: 219 mg/dL Normal liver enzymes. Recommend to start rosuvastatin 10 mg daily

## 2022-10-26 NOTE — Assessment & Plan Note (Signed)
Prostate cancer in situ.  Under surveillance. Follows up with urologist on a regular basis.

## 2022-11-03 ENCOUNTER — Other Ambulatory Visit: Payer: Self-pay

## 2022-11-03 ENCOUNTER — Telehealth: Payer: Self-pay

## 2022-11-03 ENCOUNTER — Ambulatory Visit (INDEPENDENT_AMBULATORY_CARE_PROVIDER_SITE_OTHER): Payer: BC Managed Care – PPO | Admitting: Family Medicine

## 2022-11-03 ENCOUNTER — Encounter: Payer: Self-pay | Admitting: Family Medicine

## 2022-11-03 DIAGNOSIS — J4530 Mild persistent asthma, uncomplicated: Secondary | ICD-10-CM

## 2022-11-03 DIAGNOSIS — U071 COVID-19: Secondary | ICD-10-CM | POA: Diagnosis not present

## 2022-11-03 DIAGNOSIS — J3089 Other allergic rhinitis: Secondary | ICD-10-CM

## 2022-11-03 DIAGNOSIS — J302 Other seasonal allergic rhinitis: Secondary | ICD-10-CM | POA: Diagnosis not present

## 2022-11-03 DIAGNOSIS — H1013 Acute atopic conjunctivitis, bilateral: Secondary | ICD-10-CM

## 2022-11-03 NOTE — Telephone Encounter (Signed)
Patient called in  - DOB/pharmacy verified - requesting office visit for the following symptoms: cough - green, nasal congestion, body aches, fatigue x last week. Patient denies fever  - has not taken a home COVID test.  Patient advised televisit can be done due to his symptoms - scheduled televisit with [name] today, 11/03/22 @ 3 pm.  Patient verbalized understanding, no further questions.  Pharmacy - [facility] message to provider as update.

## 2022-11-03 NOTE — Progress Notes (Signed)
RE: Rickey Cruz MRN: [id number] DOB: 1961-10-08 Date of Telemedicine Visit: 11/03/2022  Referring provider: Horald Pollen, * Primary care provider: Horald Pollen, MD  Chief Complaint: Cough (Patient gave verbal consent to treat and bill insurance for this visit.)   Telemedicine Follow Up Visit via Telephone: I connected with Rickey Cruz for a follow up on 11/03/22 by telephone and verified that I am speaking with the correct person using two identifiers.   I discussed the limitations, risks, security and privacy concerns of performing an evaluation and management service by telephone and the availability of in person appointments. I also discussed with the patient that there may be a patient responsible charge related to this service. The patient expressed understanding and agreed to proceed.  Patient is at home  Provider is at the office.  Visit start time: 16 Visit end time: [place] consent/check in by: Rickey Cruz consent and medical assistant/nurse: Caryl Pina  History of Present Illness: He is a 61 y.o. male, who is being followed for asthma, allergic rhinitis, and allergic conjunctivitis. His previous allergy office visit was on 04/27/2022 with Dr. Neldon Mc. At today's visit, he reports that he received an influenza and shingles vaccine on October 26, 2022. He began to develop a sore throat on Thursday and pruritus in his ears on Friday.  He reports that on Sunday he began to develop clear rhinorrhea, throat irritation, and cough producing clear mucus.  He reports that he did feel tired and had some chills.  He denies fever.  He does report that his boss had been sick, however, they were not in the office on the same days this week.  He has not COVID tested at home.  He reports that over the last few days he has been experiencing symptoms including wheezing, cough producing mucus that ranges in color from green to white.  Yesterday, he began using albuterol and montelukast.   Allergic rhinitis is reported as poorly controlled with thick clear nasal drainage, nasal congestion, sneezing, and postnasal drainage.  He began using Flonase yesterday and is not currently using a nasal saline rinse.  Allergic conjunctivitis is reported as moderately well controlled with recent itch and clear watery drainage.  Reflux is reported as moderately well controlled with occasional heartburn.  He is seeing his regular gastroenterologist specialist on December 13 for follow-up.  His current medications are listed in the chart. Rickey Cruz came by the clinic for COVID testing today which was positive. Discussed Paxlovid in detail and he decided not to peruse this avenue at this time.   Assessment and Plan: Rickey Cruz is a 61 y.o. male with: Patient Instructions  Asthma Begin Flovent 110-2 puffs twice a day for 2 weeks or until cough and wheeze free Continue montelukast 10 mg once a day to prevent cough or wheeze Continue albuterol 2 puffs once every 4 hours as needed for cough or wheeze You may use albuterol 2 puffs 5 to 15 minutes before activity to decrease cough or wheeze  Allergic rhinitis Continue allergen avoidance measures directed toward mold and dust mite as listed below Continue montelukast 10 mg once a day as listed above Continue Flonase 2 sprays in each nostril once a day as needed for a stuffy nose For thick postnasal drainage, begin Mucinex 600 to 1200 mg twice a day and increase fluid intake  Acute bacterial sinusitis Begin Augmentin 875 mg twice a day for the next 10 days Begin nasal saline rinses daily Continue steroid nasal spray 2 sprays  in each nostril once a day Begin Mucinex as listed above  Allergic conjunctivitis Continue olopatadine 1 drop in each eye once a day as needed for red or itchy eyes  Reflux Continue dietary and lifestyle modifications as listed below  [place] testing was positive Continue to isolate at home.  Wear a mask until you are  asymptomatic  Call the clinic if this treatment plan is not working well for you  Follow up in 1 month or sooner if needed.   Return in about 4 weeks (around 12/01/2022), or if symptoms worsen or fail to improve.   Diagnostics: COVID19 positive  Medication List:  Current Outpatient Medications  Medication Sig Dispense Refill   albuterol (VENTOLIN HFA) 108 (90 Base) MCG/ACT inhaler Inhale 2 puffs into the lungs every 6 (six) hours as needed for wheezing or shortness of breath.     amLODipine (NORVASC) 5 MG tablet TAKE 1 TABLET(5 MG) BY MOUTH DAILY 90 tablet 3   azelastine (ASTELIN) 0.1 % nasal spray Place 2 sprays into both nostrils 2 (two) times daily as needed for rhinitis. Use in each nostril as directed     clobetasol ointment (TEMOVATE) 2.53 % Apply 1 application  topically 2 (two) times daily as needed (psoriasis).     montelukast (SINGULAIR) 10 MG tablet Take 10 mg by mouth at bedtime.     rosuvastatin (CRESTOR) 10 MG tablet Take 1 tablet (10 mg total) by mouth daily. 90 tablet 3   tamsulosin (FLOMAX) 0.4 MG CAPS capsule Take 1 capsule (0.4 mg total) by mouth at bedtime. 30 capsule 1   No current facility-administered medications for this visit.   Allergies: No Known Allergies I reviewed his past medical history, social history, family history, and environmental history and no significant changes have been reported from previous visit on 04/27/2022.  Objective: Physical Exam Not obtained as encounter was done via telephone.   Previous notes and tests were reviewed.  I discussed the assessment and treatment plan with the patient. The patient was provided an opportunity to ask questions and all were answered. The patient agreed with the plan and demonstrated an understanding of the instructions.   The patient was advised to call back or seek an in-person evaluation if the symptoms worsen or if the condition fails to improve as anticipated.  I provided 35 minutes of  non-face-to-face time during this encounter.  It was my pleasure to participate in [name] care today. Please feel free to contact me with any questions or concerns.   Sincerely,  Gareth Morgan, FNP

## 2022-11-03 NOTE — Patient Instructions (Addendum)
Asthma Begin Flovent 110-2 puffs twice a day for 2 weeks or until cough and wheeze free Continue montelukast 10 mg once a day to prevent cough or wheeze Continue albuterol 2 puffs once every 4 hours as needed for cough or wheeze You may use albuterol 2 puffs 5 to 15 minutes before activity to decrease cough or wheeze  Allergic rhinitis Continue allergen avoidance measures directed toward mold and dust mite as listed below Continue montelukast 10 mg once a day as listed above Continue Flonase 2 sprays in each nostril once a day as needed for a stuffy nose For thick postnasal drainage, begin Mucinex 600 to 1200 mg twice a day and increase fluid intake  Acute bacterial sinusitis Begin Augmentin 875 mg twice a day for the next 10 days Begin nasal saline rinses daily Continue steroid nasal spray 2 sprays in each nostril once a day Begin Mucinex as listed above  Allergic conjunctivitis Continue olopatadine 1 drop in each eye once a day as needed for red or itchy eyes  Reflux Continue dietary and lifestyle modifications as listed below  COVID Your COVID testing was positive Continue to isolate at home.  Wear a mask until you are asymptomatic  Call the clinic if this treatment plan is not working well for you  Follow up in 1 month or sooner if needed.   Control of Dust Mite Allergen Dust mites play a major role in allergic asthma and rhinitis. They occur in environments with high humidity wherever human skin is found. Dust mites absorb humidity from the atmosphere (ie, they do not drink) and feed on organic matter (including shed human and animal skin). Dust mites are a microscopic type of insect that you cannot see with the naked eye. High levels of dust mites have been detected from mattresses, pillows, carpets, upholstered furniture, bed covers, clothes, soft toys and any woven material. The principal allergen of the dust mite is found in its feces. A gram of dust may contain 1,000  mites and 250,000 fecal particles. Mite antigen is easily measured in the air during house cleaning activities. Dust mites do not bite and do not cause harm to humans, other than by triggering allergies/asthma.  Ways to decrease your exposure to dust mites in your home:  1. Encase mattresses, box springs and pillows with a mite-impermeable barrier or cover  2. Wash sheets, blankets and drapes weekly in hot water (130 F) with detergent and dry them in a dryer on the hot setting.  3. Have the room cleaned frequently with a vacuum cleaner and a damp dust-mop. For carpeting or rugs, vacuuming with a vacuum cleaner equipped with a high-efficiency particulate air (HEPA) filter. The dust mite allergic individual should not be in a room which is being cleaned and should wait 1 hour after cleaning before going into the room.  4. Do not sleep on upholstered furniture (eg, couches).  5. If possible removing carpeting, upholstered furniture and drapery from the home is ideal. Horizontal blinds should be eliminated in the rooms where the person spends the most time (bedroom, study, television room). Washable vinyl, roller-type shades are optimal.  6. Remove all non-washable stuffed toys from the bedroom. Wash stuffed toys weekly like sheets and blankets above.  7. Reduce indoor humidity to less than 50%. Inexpensive humidity monitors can be purchased at most hardware stores. Do not use a humidifier as can make the problem worse and are not recommended.  Control of Mold Allergen Mold and fungi can grow  on a variety of surfaces provided certain temperature and moisture conditions exist.  Outdoor molds grow on plants, decaying vegetation and soil.  The major outdoor mold, Alternaria and Cladosporium, are found in very high numbers during hot and dry conditions.  Generally, a late Summer - Fall peak is seen for common outdoor fungal spores.  Rain will temporarily lower outdoor mold spore count, but counts rise  rapidly when the rainy period ends.  The most important indoor molds are Aspergillus and Penicillium.  Dark, humid and poorly ventilated basements are ideal sites for mold growth.  The next most common sites of mold growth are the bathroom and the kitchen.  Outdoor [employer] Use air conditioning and keep windows closed Avoid exposure to decaying vegetation. Avoid leaf raking. Avoid grain handling. Consider wearing a face mask if working in moldy areas.  Indoor Mold Control Maintain humidity below 50%. Clean washable surfaces with 5% bleach solution. Remove sources e.g. Contaminated carpets.

## 2022-11-03 NOTE — Telephone Encounter (Signed)
Televisit scheduled.

## 2022-11-07 ENCOUNTER — Telehealth: Payer: Self-pay

## 2022-11-07 NOTE — Telephone Encounter (Signed)
Patient stated that he wanted to come back into the office to get retested for Covid. Patient came into the office Thursday and covid test was positive. Advised patient that he would still be positive today. Also advised patient to get a home test to check and see if that returns positive. Patient states that it was day 5 on Thursday. Conferred with Dr. Maudie Mercury and she stated that he would still be positive today and we do not normally retest. Patient stated that he will call another doctor to see if they can help him.

## 2022-11-25 ENCOUNTER — Other Ambulatory Visit: Payer: Self-pay | Admitting: Emergency Medicine

## 2022-11-25 DIAGNOSIS — I10 Essential (primary) hypertension: Secondary | ICD-10-CM

## 2022-12-06 ENCOUNTER — Other Ambulatory Visit: Payer: Self-pay | Admitting: Emergency Medicine

## 2022-12-06 DIAGNOSIS — K644 Residual hemorrhoidal skin tags: Secondary | ICD-10-CM

## 2022-12-07 ENCOUNTER — Encounter: Payer: Self-pay | Admitting: Nurse Practitioner

## 2022-12-07 ENCOUNTER — Ambulatory Visit: Payer: BC Managed Care – PPO | Admitting: Nurse Practitioner

## 2022-12-07 VITALS — BP 122/86 | HR 76 | Ht 66.0 in | Wt 188.0 lb

## 2022-12-07 DIAGNOSIS — K219 Gastro-esophageal reflux disease without esophagitis: Secondary | ICD-10-CM | POA: Diagnosis not present

## 2022-12-07 DIAGNOSIS — R109 Unspecified abdominal pain: Secondary | ICD-10-CM | POA: Diagnosis not present

## 2022-12-07 MED ORDER — OMEPRAZOLE 20 MG PO CPDR: 20.0000 mg | DELAYED_RELEASE_CAPSULE | Freq: Every day | ORAL | 2 refills | Status: DC

## 2022-12-07 NOTE — Progress Notes (Addendum)
Assessment    Patient profile:  Rickey Cruz is a 61 y.o. male known to Rickey Cruz with a past medical history of HTN, kidney stones,  asthma, GERD, depression . See PMH /PSH for additional history  # 61 yo male with chronic left flank pain, occasionally associated with nausea. Pain is exactly the same as when diagnosed with left kidney stones. However, stones removed in June and per patient a follow up CT scan in October was negative for persistent stones and Urologist doesn't feel pain is urologic in nature. Etiology of pain is not clear but doesn't seem gastrointestinal in nature.   # Bitter taste in mouth, ? Reflux.   Plan   ** Will be exported to patient instructions on AVS  Patient will get copy of CT scan done in October by [facility] which surprisingly sounds like it was done with IV contrast  Trial of Omeprazole 20 mg 30 minutes before breakfast for what sounds like reflux with bitter taste in mouth  Trial of SalonPaz patches to affected area. He has some at home (but hasn't tried yet).  Follow up with Rickey Cruz in a couple of months.    ADDENDUM 01/02/23  Patient brought in a copy of the CT scan done by urology. 10/12/2022 CT scan of the abdomen and pelvis with and without contrast.  Will scan report into epic.  No liver masses.  Prior cholecystectomy.  No biliary duct dilation.  No mass or inflammatory changes of the pancreas.  Spleen within normal limits.  Mild diffuse bladder wall thickening again seen.  Enlargement of the prostate.  No evidence of stomach or bowel obstruction/inflammatory process or abnormal fluid collections.  No acute findings.  HPI    Chief complaint: left flank pain, bitter taste in mouth   Rickey Cruz was last seen 09/11/19, please refer to that office note for details. In summary, he was seen for persistent left low back/left waist pain and also nausea ( had also been seen in Aug 2020 for same symptoms) .  Prior labs and CT scan had been  unrevealing except for non-obstructing left nephrolithiasis .   Interval History:  Since we last saw him in [place] has undergone a couple of urologic procedures to remove left kidney stones. Last urologic procedure was in June 2023. Despite stone removal he has continued to have the exact same left flank pain which is occasionally associated with nausea. However, Urology ordered a repeat CT scan in October which apparently showed no remaining stones.  Episodes occurs several times a weeks and lasts a couple of minutes each time. The pain has no relationship to eating or physical activity. It is not related to / relieved with bowel movements.  Tylenol / ibuprofen help as does drinking water  Weight is stable.   Additionally he complains of a "nasty acid" taste in is mouth.  He doesn't have regurgitation nor heartburn.    Previous GI Evaluation   EGD with biopsies Nov 2017 Gastritis Path - chronic gastritis / reactive gastropathy   Colonoscopy Nov 2017 - hx of colon polyps Normal.  Repeat 10 years   Imaging   June 2023 non-contrast CT scan 1. No bowel obstruction. 2. Mild left hydronephrosis, new or increased since the prior CT. Similar positioning of the left ureteral stent. A 3 mm stone in the distal left ureter along the stent in similar location as the prior CT. Several small nonobstructing left renal inferior pole calculi. 3. Enlarged prostate  gland with lobulated median lobe hypertrophy indenting and protruding through the base of the bladder. 4. Aortic Atherosclerosis (ICD10-I70.0)   Labs:     Latest Ref Rng & Units 09/26/2022   10:34 AM 06/20/2022   12:03 PM 06/06/2022    3:42 AM  CBC  WBC 4.0 - 10.5 K/uL 5.3  4.8  10.3   Hemoglobin 13.0 - 17.0 g/dL 16.0  12.8  11.4   Hematocrit 39.0 - 52.0 % 46.3  37.8  33.9   Platelets 150.0 - 400.0 K/uL 277.0  370.0  313        Latest Ref Rng & Units 09/26/2022   10:34 AM 06/20/2022   12:03 PM 06/06/2022    3:42 AM  Hepatic  Function  Total Protein 6.0 - 8.3 g/dL 7.3  6.8  5.8   Albumin 3.5 - 5.2 g/dL 4.3  4.0  2.3   AST 0 - 37 U/L 17  22  221   ALT 0 - 53 U/L 21  60  212   Alk Phosphatase 39 - 117 U/L 68  80  91   Total Bilirubin 0.2 - 1.2 mg/dL 0.5  0.4  0.7      Past Medical History:  Diagnosis Date   Allergy    Flonase, Patanol, Zyrtec   Arthritis    psoriasis   Asthma    seasonal, with allergies   Borderline diabetes    Cluster headache    Colon polyp 10/27/2011   Depression    denies 11/11/16; denies 11/21/18 "been years"   Eczema    GERD (gastroesophageal reflux disease)    History of kidney stones    x1   Hyperlipidemia    Hypertension    Low back pain    "in kidney area-was told that it was related to gallstones"   Prediabetes    Prostate cancer ([place])    Psoriasis    Rickey Cruz/dermatology   Severe sleep apnea    h/o; had procedure and weight control, does not have to wear CPAP   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 Repeat 5 years.   CYSTOSCOPY WITH RETROGRADE PYELOGRAM, URETEROSCOPY AND STENT PLACEMENT Left 05/22/2022   Procedure: CYSTOSCOPY WITH RETROGRADE PYELOGRAM, URETEROSCOPY AND STENT PLACEMENT; TRANSURETHRAL RESECTION OF PROSTATE;  Surgeon: Janith Lima, MD;  Location: [facility];  Service: Urology;  Laterality: Left;   CYSTOSCOPY WITH RETROGRADE PYELOGRAM, URETEROSCOPY AND STENT PLACEMENT Left 06/07/2022   Procedure: CYSTOSCOPY WITH RETROGRADE PYELOGRAM, URETEROSCOPY AND STENT EXCHANGE;  Surgeon: Robley Fries, MD;  Location: [facility];  Service: Urology;  Laterality: Left;  90 MINS   EXTRACORPOREAL SHOCK WAVE LITHOTRIPSY Left 05/09/2022   Procedure:  EXTRACORPOREAL SHOCK WAVE LITHOTRIPSY (ESWL);  Surgeon: Alexis Frock, MD;  Location: [facility];  Service: Urology;  Laterality: Left;   HOLMIUM LASER APPLICATION Left 9/56/2130   Procedure: HOLMIUM LASER APPLICATION;  Surgeon: Robley Fries, MD;  Location: [facility];  Service: Urology;  Laterality: Left;   POLYPECTOMY     PROSTATE BIOPSY  10/2014   will have another in 3 months   SEPTOPLASTY  2014   TONSILLECTOMY  2014   TRANSURETHRAL RESECTION OF  PROSTATE N/A 06/07/2022   Procedure: TRANSURETHRAL RESECTION OF THE PROSTATE (TURP);  Surgeon: Robley Fries, MD;  Location: [facility];  Service: Urology;  Laterality: N/A;   UVULOPALATOPHARYNGOPLASTY  2014    Current Medications, Allergies, Family History and Social History were reviewed in [employer] record.     Current Outpatient Medications  Medication Sig Dispense Refill   albuterol (VENTOLIN HFA) 108 (90 Base) MCG/ACT inhaler Inhale 2 puffs into the lungs every 6 (six) hours as needed for wheezing or shortness of breath.     amLODipine (NORVASC) 5 MG tablet TAKE 1 TABLET(5 MG) BY MOUTH DAILY 90 tablet 3   azelastine (ASTELIN) 0.1 % nasal spray Place 2 sprays into both nostrils 2 (two) times daily as needed for rhinitis. Use in each nostril as directed     clobetasol ointment (TEMOVATE) 4.19 % Apply 1 application  topically 2 (two) times daily as needed (psoriasis).     hydrocortisone (ANUSOL-HC) 2.5 % rectal cream APPLY RECTALLY TO THE AFFECTED AREA TWICE DAILY 30 g 5   montelukast (SINGULAIR) 10 MG tablet Take 10 mg by mouth at bedtime.     rosuvastatin (CRESTOR) 10 MG tablet Take 1 tablet (10 mg total) by mouth daily. 90 tablet 3   tamsulosin (FLOMAX) 0.4 MG CAPS capsule Take 1 capsule (0.4 mg total) by mouth at bedtime. 30 capsule 1   No current facility-administered medications for this visit.    Review of Systems: No chest pain. No shortness of breath. No urinary complaints.     Physical Exam  Wt Readings from Last 3 Encounters:  10/26/22 183 lb 6 oz (83.2 kg)  09/26/22 185 lb (83.9 kg)  06/20/22 172 lb 8 oz (78.2 kg)    BP 122/86   Pulse 76   Ht _0  (1.676 m)   Wt 188 lb (85.3 kg)   BMI 30.34 kg/m  Constitutional:  Generally well appearing male in no acute distress. Psychiatric: Pleasant. Normal mood and affect. Behavior is normal. EENT: Pupils normal.  Conjunctivae are normal. No scleral icterus. Neck supple.  Cardiovascular: Normal rate, regular rhythm. No edema Pulmonary/chest: Effort normal and breath sounds normal. No wheezing, rales or rhonchi. Abdominal: Soft, nondistended, nontender. Bowel sounds active throughout. There are no masses palpable. No hepatomegaly. Neurological: Alert and oriented to person place and time. Skin: Skin is warm and dry. No rashes noted.  Tye Savoy, NP  12/07/2022, 8:26 AM

## 2022-12-07 NOTE — Patient Instructions (Addendum)
We have sent the following medications to your pharmacy for you to pick up at your convenience:  Omeprazole 20 mg daily 30 minutes breakfast.  Try Lidocaine Patch - You already have at home as needed.  You have been scheduled for an appointment with Dr. Loletha Carrow on 02/07/23 at 920 am. Please arrive 10 minutes early for your appointment. Addedum: Made appt with Dr Loletha Carrow in error. Called patient after appt and given a new appointment with Dr. Silverio Decamp.   Please obtain and drop [place] or fax a copy of CT scan done in October from [facility].  Our Fax # [phone] Attn: Tye Savoy, NP  _______________________________________________________   If you are age 37 or younger, your body mass index should be between 19-25. Your Body mass index is 30.34 kg/m. If this is out of the aformentioned range listed, please consider follow up with your Primary Care Provider.   ________________________________________________________  The [facility] providers would like to encourage you to use [facility] to communicate with providers for non-urgent requests or questions.  Due to long hold times on the telephone, sending your provider a message by [facility] may be a faster and more efficient way to get a response.  Please allow 48 business hours for a response.  Please remember that this is for non-urgent requests.    Thank you for choosing me and [facility].  Tye Savoy, NP _______________________________________________________

## 2022-12-30 LAB — HM DIABETES EYE EXAM

## 2023-01-02 ENCOUNTER — Encounter: Payer: Self-pay | Admitting: *Deleted

## 2023-01-09 ENCOUNTER — Telehealth: Payer: Self-pay

## 2023-01-09 NOTE — Telephone Encounter (Signed)
Left message for pt to call back.

## 2023-01-09 NOTE — Telephone Encounter (Signed)
-----  Message from Willia Craze, NP sent at 01/06/2023  3:58 PM EST ----- Before he canceled his appointment with Dr. Silverio Decamp I would wait to see what urology has to say.  If they think his pain is related to the kidney stone that he can call and cancel the appointment with Dr. Silverio Decamp.  On the other hand, if they think that it is an asymptomatic stone then he should keep his appointment with [place].  Thanks ----- Message ----- From: Marice Potter, RN Sent: 01/05/2023   2:03 PM EST To: Willia Craze, NP  Spoke with pt and gave pt your message. He stated that he had an [place] recently that showed he had a kidney stone and he has a follow up with urololgy tomorrow. Pt wanted to know if he needs to keep his follow up appointment with Dr. Silverio Decamp on 02/06/23. Pt requested that I send him a Pharmacist, community message with the recommendation.  ----- Message ----- From: Willia Craze, NP Sent: 01/02/2023   4:45 PM EST To: Marice Potter, RN  Mickel Baas, please call Rickey Cruz and tell him the following:  Thank you for bringing the scan and disc by to me.  There is nothing on the CT scan that shows a source of your abdominal pain.  I had wanted you to make an appointment to see Dr. Silverio Decamp in a couple of months, please keep that appointment.  Mickel Baas please make sure he does have appt with her for ongoing abdominal pain.  Also Mickel Baas, tell him that we do not need to have this disc.  He should keep it disc for his records.  I will put it in my office and tell him when he comes for his appointment to ask for the it  Thanks

## 2023-01-09 NOTE — Telephone Encounter (Signed)
Gave pt [name]'s message and pt verbalized understanding. Pt stated that he will see urology this Friday and will see what they say.

## 2023-02-06 ENCOUNTER — Encounter: Payer: Self-pay | Admitting: Gastroenterology

## 2023-02-06 ENCOUNTER — Ambulatory Visit: Payer: BC Managed Care – PPO | Admitting: Gastroenterology

## 2023-02-06 VITALS — BP 114/70 | HR 76 | Ht 65.5 in | Wt 187.5 lb

## 2023-02-06 DIAGNOSIS — R109 Unspecified abdominal pain: Secondary | ICD-10-CM | POA: Diagnosis not present

## 2023-02-06 DIAGNOSIS — K219 Gastro-esophageal reflux disease without esophagitis: Secondary | ICD-10-CM

## 2023-02-06 DIAGNOSIS — Z86010 Personal history of colonic polyps: Secondary | ICD-10-CM

## 2023-02-06 MED ORDER — NA SULFATE-K SULFATE-MG SULF 17.5-3.13-1.6 GM/177ML PO SOLN: ORAL | 0 refills | Status: DC

## 2023-02-06 NOTE — Patient Instructions (Signed)
Continue Omeprazole  Lactose Free Diet  GERD Instructions  You have been scheduled for an endoscopy and colonoscopy. Please follow the written instructions given to you at your visit today. Please pick up your prep supplies at the pharmacy within the next 1-3 days. If you use inhalers (even only as needed), please bring them with you on the day of your procedure.   _______________________________________________________  If your blood pressure at your visit was 140/90 or greater, please contact your primary care physician to follow up on this.  _______________________________________________________  If you are age 52 or older, your body mass index should be between 23-30. Your Body mass index is 30.73 kg/m. If this is out of the aforementioned range listed, please consider follow up with your Primary Care Provider.  If you are age 29 or younger, your body mass index should be between 19-25. Your Body mass index is 30.73 kg/m. If this is out of the aformentioned range listed, please consider follow up with your Primary Care Provider.   ________________________________________________________  The [facility] providers would like to encourage you to use [facility] to communicate with providers for non-urgent requests or questions.  Due to long hold times on the telephone, sending your provider a message by [facility] may be a faster and more efficient way to get a response.  Please allow 48 business hours for a response.  Please remember that this is for non-urgent requests.  _______________________________________________________   Due to recent changes in healthcare laws, you may see the results of your imaging and laboratory studies on MyChart before your provider has had a chance to review them.  We understand that in some cases there may be results that are confusing or concerning to you. Not all laboratory results come back in the same time frame and the provider may be waiting for multiple  results in order to interpret others.  Please give [place] 48 hours in order for your provider to thoroughly review all the results before contacting the office for clarification of your results.    Thank you for choosing [facility]  Kavitha Nandigam,MD Gastroesophageal Reflux Disease, Adult Gastroesophageal reflux (GER) happens when acid from the stomach flows up into the tube that connects the mouth and the stomach (esophagus). Normally, food travels down the esophagus and stays in the stomach to be digested. However, when a person has GER, food and stomach acid sometimes move back up into the esophagus. If this becomes a more serious problem, the person may be diagnosed with a disease called gastroesophageal reflux disease (GERD). GERD occurs when the reflux: Happens often. Causes frequent or severe symptoms. Causes problems such as damage to the esophagus. When stomach acid comes in contact with the esophagus, the acid may cause inflammation in the esophagus. Over time, GERD may create small holes (ulcers) in the lining of the esophagus. What are the causes? This condition is caused by a problem with the muscle between the esophagus and the stomach (lower esophageal sphincter, or LES). Normally, the LES muscle closes after food passes through the esophagus to the stomach. When the LES is weakened or abnormal, it does not close properly, and that allows food and stomach acid to go back up into the esophagus. The LES can be weakened by certain dietary substances, medicines, and medical conditions, including: Tobacco use. Pregnancy. Having a hiatal hernia. Alcohol use. Certain foods and beverages, such as coffee, chocolate, onions, and peppermint. What increases the risk? You are more likely to develop this condition  if you: Have an increased body weight. Have a connective tissue disorder. Take NSAIDs, such as ibuprofen. What are the signs or symptoms? Symptoms of this condition  include: Heartburn. Difficult or painful swallowing and the feeling of having a lump in the throat. A bitter taste in the mouth. Bad breath and having a large amount of saliva. Having an upset or bloated stomach and belching. Chest pain. Different conditions can cause chest pain. Make sure you see your health care provider if you experience chest pain. Shortness of breath or wheezing. Ongoing (chronic) cough or a nighttime cough. Wearing away of tooth enamel. Weight loss. How is this diagnosed? This condition may be diagnosed based on a medical history and a physical exam. To determine if you have mild or severe GERD, your health care provider may also monitor how you respond to treatment. You may also have tests, including: A test to examine your stomach and esophagus with a small camera (endoscopy). A test that measures the acidity level in your esophagus. A test that measures how much pressure is on your esophagus. A barium swallow or modified barium swallow test to show the shape, size, and functioning of your esophagus. How is this treated? Treatment for this condition may vary depending on how severe your symptoms are. Your health care provider may recommend: Changes to your diet. Medicine. Surgery. The goal of treatment is to help relieve your symptoms and to prevent complications. Follow these instructions at home: Eating and drinking  Follow a diet as recommended by your health care provider. This may involve avoiding foods and drinks such as: Coffee and tea, with or without caffeine. Drinks that contain alcohol. Energy drinks and sports drinks. Carbonated drinks or sodas. Chocolate and cocoa. Peppermint and mint flavorings. Garlic and onions. Horseradish. Spicy and acidic foods, including peppers, chili powder, curry powder, vinegar, hot sauces, and barbecue sauce. Citrus fruit juices and citrus fruits, such as oranges, lemons, and limes. Tomato-based foods, such as red  sauce, chili, salsa, and pizza with red sauce. Fried and fatty foods, such as donuts, french fries, potato chips, and high-fat dressings. High-fat meats, such as hot dogs and fatty cuts of red and white meats, such as rib eye steak, sausage, ham, and bacon. High-fat dairy items, such as whole milk, butter, and cream cheese. Eat small, frequent meals instead of large meals. Avoid drinking large amounts of liquid with your meals. Avoid eating meals during the 2-3 hours before bedtime. Avoid lying down right after you eat. Do not exercise right after you eat. Lifestyle  Do not use any products that contain nicotine or tobacco. These products include cigarettes, chewing tobacco, and vaping devices, such as e-cigarettes. If you need help quitting, ask your health care provider. Try to reduce your stress by using methods such as yoga or meditation. If you need help reducing stress, ask your health care provider. If you are overweight, reduce your weight to an amount that is healthy for you. Ask your health care provider for guidance about a safe weight loss goal. General instructions Pay attention to any changes in your symptoms. Take over-the-counter and prescription medicines only as told by your health care provider. Do not take aspirin, ibuprofen, or other NSAIDs unless your health care provider told you to take these medicines. Wear loose-fitting clothing. Do not wear anything tight around your waist that causes pressure on your abdomen. Raise (elevate) the head of your bed about 6 inches (15 cm). You can use a wedge to do  this. Avoid bending over if this makes your symptoms worse. Keep all follow-up visits. This is important. Contact a health care provider if: You have: New symptoms. Unexplained weight loss. Difficulty swallowing or it hurts to swallow. Wheezing or a persistent cough. A hoarse voice. Your symptoms do not improve with treatment. Get help right away if: You have sudden  pain in your arms, neck, jaw, teeth, or back. You suddenly feel sweaty, dizzy, or light-headed. You have chest pain or shortness of breath. You vomit and the vomit is green, yellow, or black, or it looks like blood or coffee grounds. You faint. You have stool that is red, bloody, or black. You cannot swallow, drink, or eat. These symptoms may represent a serious problem that is an emergency. Do not wait to see if the symptoms will go away. Get medical help right away. Call your local emergency services (911 in the [place] Do not drive yourself to the hospital. Summary Gastroesophageal reflux happens when acid from the stomach flows up into the esophagus. GERD is a disease in which the reflux happens often, causes frequent or severe symptoms, or causes problems such as damage to the esophagus. Treatment for this condition may vary depending on how severe your symptoms are. Your health care provider may recommend diet and lifestyle changes, medicine, or surgery. Contact a health care provider if you have new or worsening symptoms. Take over-the-counter and prescription medicines only as told by your health care provider. Do not take aspirin, ibuprofen, or other NSAIDs unless your health care provider told you to do so. Keep all follow-up visits as told by your health care provider. This is important. This information is not intended to replace advice given to you by your health care provider. Make sure you discuss any questions you have with your health care provider. Document Revised: 06/22/2020 Document Reviewed: 06/22/2020 Elsevier Patient Education  [place].   Lactose-Free Diet, Adult If you have lactose intolerance, you are not able to digest lactose. Lactose is a natural sugar found mainly in dairy milk and dairy products. A lactose-free diet can help you avoid foods and beverages that contain lactose. What are tips for following this plan? Reading food labels Do not consume  foods, beverages, vitamins, minerals, or medicines containing lactose. Read ingredient lists carefully. Look for the words "lactose-free" on labels. Meal planning Use alternatives to dairy milk and foods made with milk products. These include the following: Lactose-free milk. Soy milk with added calcium and vitamin D. Almond milk, coconut milk, rice milk, or other nondairy milk alternatives with added calcium and vitamin D. Note that a lot of these are low in protein. Soy products, such as soy yogurt, soy cheese, soy ice cream, and soy-based sour cream. Other nut milk products, such as almond yogurt, almond cheese, cashew yogurt, cashew cheese, cashew ice cream, coconut yogurt, and coconut ice cream. Medicines, vitamins, and supplements Use lactase enzyme drops or tablets as directed by your health care provider. Make sure you get enough calcium and vitamin D in your diet. A lactose-free eating plan can be lacking in these important nutrients. Take calcium and vitamin D supplements as directed by your health care provider. Talk with your health care provider about supplements if you are not able to get enough calcium and vitamin D from food. What foods should I eat?  Fruits All fresh, canned, frozen, or dried fruits and fruit juices that are not processed with lactose. Vegetables All fresh, frozen, and canned vegetables without  cheese, cream, or butter sauces. Grains Any that are not made with dairy milk or dairy products. Meats and other proteins Any meat, fish, poultry, and other protein sources that are not made with dairy milk or dairy products. Fats and oils Any that are not made with dairy milk or dairy products. Sweets and desserts Any that are not made with dairy milk or dairy products. Seasonings and condiments Any that are not made with dairy milk or dairy products. Calcium Calcium is found in many foods that contain lactose and is important for bone health. The amount of  calcium you need depends on your age: Adults younger than 50 years: 1,000 mg of calcium a day. Adults older than 50 years: 1,200 mg of calcium a day. If you are not getting enough calcium, you may get it from other sources, including: Orange juice that has been fortified with calcium. This means that calcium has been added to the product. There are 300-350 mg of calcium in 1 cup (237 mL) of calcium-fortified orange juice. Soy milk fortified with calcium. There are 300-400 mg of calcium in 1 cup (237 mL) of calcium-fortified soy milk. Rice or almond milk fortified with calcium. There are 300 mg of calcium in 1 cup (237 mL) of calcium-fortified rice or almond milk. Breakfast cereals fortified with calcium. There are 100-1,000 mg of calcium in calcium-fortified breakfast cereals. Spinach, cooked. There are 145 mg of calcium in  cup (90 g) of cooked spinach. Edamame, cooked. There are 130 mg of calcium in  cup (47 g) of cooked edamame. Collard greens, cooked. There are 125 mg of calcium in  cup (85 g) of cooked collard greens. Kale, frozen or cooked. There are 90 mg of calcium in  cup (59 g) of cooked or frozen kale. Almonds. There are 95 mg of calcium in  cup (35 g) of almonds. Broccoli, cooked. There are 60 mg of calcium in 1 cup (156 g) of cooked broccoli. The items listed above may not be a complete list of foods and beverages you can eat. Contact a dietitian for more options. What foods should I avoid? Lactose is found in dairy milk and dairy products, such as: Yogurt. Cheese. Butter. Margarine. Sour cream. Cream. Whipped toppings and creamers. Ice cream and other dairy-based desserts. Lactose is also found in foods or products made with dairy milk or milk ingredients. To find out whether a food contains dairy milk or a milk ingredient, look at the ingredients list. Avoid foods with the statement "May contain milk" and foods that contain: Milk  powder. Whey. Curd. Lactose. Lactoglobulin. The items listed above may not be a complete list of foods and beverages to avoid. Contact a dietitian for more information. Where to find more information [employer] of Diabetes and Digestive and Kidney Diseases: [email or web address] Summary If you are lactose intolerant, it means that you are not able to digest lactose, a natural sugar found in milk and milk products. Following a lactose-free diet can help you manage this condition. Calcium is important for bone health and is found in many foods that contain lactose. Talk with your health care provider about other sources of calcium. This information is not intended to replace advice given to you by your health care provider. Make sure you discuss any questions you have with your health care provider. Document Revised: 11/17/2020 Document Reviewed: 11/17/2020 Elsevier Patient Education  [place].

## 2023-02-06 NOTE — Progress Notes (Signed)
Rickey Cruz    [id number]    1961/12/09  Primary Care Physician:Sagardia, Ines Bloomer, MD  Referring Physician: Horald Pollen, MD [place],  [place] [address]   Chief complaint:  GERD  HPI:  68 yr very pleasnat gentleman here with c/o persistent GERD symptoms and LUQ discomfort He had lithotripsy for kidney stones in May for kidney stones, he may still have persistent nephrolithiasis  He has h/o colon polyps  Denies any nausea, vomiting, melena or bright red blood per rectum   Colonoscopy 11/2016 Impression:               - The entire examined colon is normal on direct and                            retroflexion views.                           - No polyps or cancers EGD 10/2016 Impression:               - Normal esophagus.                           - Gastritis. Biopsied.                           - Normal examined duodenum.   Outpatient Encounter Medications as of 02/06/2023  Medication Sig   albuterol (VENTOLIN HFA) 108 (90 Base) MCG/ACT inhaler Inhale 2 puffs into the lungs every 6 (six) hours as needed for wheezing or shortness of breath.   amLODipine (NORVASC) 5 MG tablet TAKE 1 TABLET(5 MG) BY MOUTH DAILY   azelastine (ASTELIN) 0.1 % nasal spray Place 2 sprays into both nostrils 2 (two) times daily as needed for rhinitis. Use in each nostril as directed   clobetasol ointment (TEMOVATE) AB-123456789 % Apply 1 application  topically 2 (two) times daily as needed (psoriasis).   finasteride (PROSCAR) 5 MG tablet Take 5 mg by mouth daily.   fluocinonide ointment (LIDEX) AB-123456789 % Apply 1 Application topically at bedtime.   hydrocortisone (ANUSOL-HC) 2.5 % rectal cream APPLY RECTALLY TO THE AFFECTED AREA TWICE DAILY   ibuprofen (ADVIL) 600 MG tablet Take 600 mg by mouth as needed.   ketoconazole (NIZORAL) 2 % shampoo Apply 1 Application topically as needed.   montelukast (SINGULAIR) 10 MG tablet Take 10 mg by mouth at bedtime.   Olopatadine HCl (PATADAY  OP) Apply 1 drop to eye as needed.   omeprazole (PRILOSEC) 20 MG capsule Take 1 capsule (20 mg total) by mouth daily before breakfast.   rosuvastatin (CRESTOR) 10 MG tablet Take 1 tablet (10 mg total) by mouth daily.   tamsulosin (FLOMAX) 0.4 MG CAPS capsule Take 1 capsule (0.4 mg total) by mouth at bedtime.   HYDROcodone-acetaminophen (NORCO) 7.5-325 MG tablet Take 1 tablet by mouth as needed. (Patient not taking: Reported on 02/06/2023)   No facility-administered encounter medications on file as of 02/06/2023.    Allergies as of 02/06/2023   (No Known Allergies)    Past Medical History:  Diagnosis Date   Allergy    Flonase, Patanol, Zyrtec   Arthritis    psoriasis   Asthma    seasonal, with allergies   Borderline diabetes    Cluster  headache    Colon polyp 10/27/2011   Depression    denies 11/11/16; denies 11/21/18 "been years"   Eczema    GERD (gastroesophageal reflux disease)    History of kidney stones    x1   Hyperlipidemia    Hypertension    Low back pain    "in kidney area-was told that it was related to gallstones"   Prediabetes    Prostate cancer ([place])    Psoriasis    [name]   Severe sleep apnea    h/o; had procedure and weight control, does not have to wear CPAP   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 Repeat 5 years.   CYSTOSCOPY WITH RETROGRADE PYELOGRAM, URETEROSCOPY AND STENT PLACEMENT Left 05/22/2022   Procedure: CYSTOSCOPY WITH RETROGRADE PYELOGRAM, URETEROSCOPY AND STENT PLACEMENT; TRANSURETHRAL RESECTION OF PROSTATE;  Surgeon: Janith Lima, MD;  Location: [facility];  Service: Urology;  Laterality: Left;    CYSTOSCOPY WITH RETROGRADE PYELOGRAM, URETEROSCOPY AND STENT PLACEMENT Left 06/07/2022   Procedure: CYSTOSCOPY WITH RETROGRADE PYELOGRAM, URETEROSCOPY AND STENT EXCHANGE;  Surgeon: Robley Fries, MD;  Location: [facility];  Service: Urology;  Laterality: Left;  90 MINS   EXTRACORPOREAL SHOCK WAVE LITHOTRIPSY Left 05/09/2022   Procedure: EXTRACORPOREAL SHOCK WAVE LITHOTRIPSY (ESWL);  Surgeon: Alexis Frock, MD;  [facility];  Service: Urology;  Laterality: Left;   HOLMIUM LASER APPLICATION Left 0000000   Procedure: HOLMIUM LASER APPLICATION;  Surgeon: Robley Fries, MD;  Location: [facility];  Service: Urology;  Laterality: Left;   POLYPECTOMY     PROSTATE BIOPSY  10/2014   will have another in 3 months   SEPTOPLASTY  2014   TONSILLECTOMY  2014   TRANSURETHRAL RESECTION OF PROSTATE N/A 06/07/2022   Procedure: TRANSURETHRAL RESECTION OF THE PROSTATE (TURP);  Surgeon: Robley Fries, MD;  Location: [facility];  Service: Urology;  Laterality: N/A;   UVULOPALATOPHARYNGOPLASTY  2014    Family History  Problem Relation Age of Onset   Stroke Mother    Asthma Sister    Colon cancer Neg Hx    Esophageal cancer Neg Hx    Stomach cancer Neg Hx    Prostate cancer Neg Hx    Diabetes Neg Hx    CAD Neg Hx    Migraines Neg Hx    Headache Neg Hx     Social History   Socioeconomic History   Marital status: Divorced    Spouse name: Not on file   Number of children: 1   Years of education: Not on file   Highest education level: Doctorate  Occupational History   Occupation: Product manager: [employer]    Comment: Pharmacist, community  Tobacco Use   Smoking status: Never   Smokeless tobacco: Never  Vaping Use   Vaping Use: Never used  Substance and Sexual Activity   Alcohol use: Never    Alcohol/week: 0.0 standard drinks of alcohol   Drug use: Never   Sexual activity: Yes    Comment: SSP  Other Topics Concern   Not on file  Social History Narrative    Original from [place]; moved to  [place] 1995.   Marital status: same sexual partner 4 X years.   Children:  1 child/son (18) at 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        Tobacco: never      Alcohol: none      Drugs: none       Exercise: daily in 2018; 10,000 steps daily; lots of walking on campus at work      ADLs: independent with ADLs.        Sexual activity: dating males only in 2018; bisexual.  Previously married to male.       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         Social Determinants of Health   Financial Resource Strain: Not on file  Food Insecurity: Not on file  Transportation Needs: Not on file  Physical Activity: Not on file  Stress: Not on file  Social Connections: Not on file  Intimate Partner Violence: Not on file      Review of systems: All other review of systems negative except as mentioned in the HPI.   Physical Exam: Vitals:   02/06/23 1016  BP: 114/70  Pulse: 76   Body mass index is 30.73 kg/m. Gen:      No acute distress HEENT:  sclera anicteric Abd:      soft, non-tender; no palpable masses, no distension Ext:    No edema Neuro: alert and oriented x 3 Psych: normal mood and affect  Data Reviewed:  Reviewed labs, radiology imaging, old records and pertinent past GI work up   Assessment and Plan/Recommendations:  10 yr very pleasant gentleman with c/o persistent GERD symptoms, LUQ abd pain, left flank pain Will schedule EGD for further evaluation. Exclude peptic ulcer disease, H.pylori gastritis  Due for surveillance colonoscopy,  will schedule it. He has h/o advanced adenomatous colon polyps  The risks and benefits as well as alternatives of endoscopic procedure(s) have been discussed and reviewed. All questions answered. The patient agrees to proceed.    The patient was provided an opportunity to ask questions and all were answered. The patient agreed with the plan and demonstrated an understanding of the instructions.  Damaris Hippo , MD    CC: [place], [place]

## 2023-02-07 ENCOUNTER — Ambulatory Visit: Payer: BC Managed Care – PPO | Admitting: Gastroenterology

## 2023-02-13 ENCOUNTER — Encounter: Payer: Self-pay | Admitting: Emergency Medicine

## 2023-02-13 ENCOUNTER — Other Ambulatory Visit: Payer: Self-pay | Admitting: Emergency Medicine

## 2023-02-13 LAB — HM DIABETES EYE EXAM

## 2023-02-13 MED ORDER — AMLODIPINE BESYLATE 10 MG PO TABS: 10.0000 mg | ORAL_TABLET | Freq: Every day | ORAL | 3 refills | Status: DC

## 2023-02-13 MED ORDER — FLUOCINONIDE 0.05 % EX CREA: 1.0000 | TOPICAL_CREAM | Freq: Two times a day (BID) | CUTANEOUS | 0 refills | Status: DC

## 2023-02-13 NOTE — Telephone Encounter (Signed)
New prescription sent to pharmacy of record today.  Thanks.

## 2023-02-20 ENCOUNTER — Encounter: Payer: Self-pay | Admitting: Gastroenterology

## 2023-02-28 ENCOUNTER — Encounter: Payer: Self-pay | Admitting: Gastroenterology

## 2023-02-28 ENCOUNTER — Ambulatory Visit (AMBULATORY_SURGERY_CENTER): Payer: BC Managed Care – PPO | Admitting: Gastroenterology

## 2023-02-28 VITALS — BP 116/82 | HR 55 | Temp 98.9°F | Resp 19 | Ht 65.5 in | Wt 187.0 lb

## 2023-02-28 DIAGNOSIS — K219 Gastro-esophageal reflux disease without esophagitis: Secondary | ICD-10-CM | POA: Diagnosis not present

## 2023-02-28 DIAGNOSIS — Z86010 Personal history of colonic polyps: Secondary | ICD-10-CM

## 2023-02-28 DIAGNOSIS — K319 Disease of stomach and duodenum, unspecified: Secondary | ICD-10-CM

## 2023-02-28 DIAGNOSIS — Z09 Encounter for follow-up examination after completed treatment for conditions other than malignant neoplasm: Secondary | ICD-10-CM | POA: Diagnosis present

## 2023-02-28 MED ORDER — SODIUM CHLORIDE 0.9 % IV SOLN: 500.0000 mL | Freq: Once | INTRAVENOUS | Status: DC

## 2023-02-28 MED ORDER — PANTOPRAZOLE SODIUM 40 MG PO TBEC: 40.0000 mg | DELAYED_RELEASE_TABLET | Freq: Two times a day (BID) | ORAL | 0 refills | Status: DC

## 2023-02-28 NOTE — Op Note (Signed)
[facility] Patient Name: Rickey Cruz Procedure Date: 02/28/2023 3:18 PM MRN: [id number] Endoscopist: Mauri Pole , MD, [id number] Age: 62 Referring MD:  Date of Birth: [birth year] Gender: Male Account #: [id number] Procedure:                Upper GI endoscopy Indications:              Abdominal pain in the left upper quadrant,                            Esophageal reflux symptoms that persist despite                            appropriate therapy Medicines:                Monitored Anesthesia Care Procedure:                Pre-Anesthesia Assessment:                           - Prior to the procedure, a History and Physical                            was performed, and patient medications and                            allergies were reviewed. The patient's tolerance of                            previous anesthesia was also reviewed. The risks                            and benefits of the procedure and the sedation                            options and risks were discussed with the patient.                            All questions were answered, and informed consent                            was obtained. Prior Anticoagulants: The patient has                            taken no anticoagulant or antiplatelet agents. ASA                            Grade Assessment: III - A patient with severe                            systemic disease. After reviewing the risks and                            benefits, the patient was deemed in satisfactory  condition to undergo the procedure.                           After obtaining informed consent, the endoscope was                            passed under direct vision. Throughout the                            procedure, the patient's blood pressure, pulse, and                            oxygen saturations were monitored continuously. The                            GIF [id number] was introduced  through the                            mouth, and advanced to the second part of duodenum.                            The upper GI endoscopy was accomplished without                            difficulty. The patient tolerated the procedure                            well. Scope In: Scope Out: Findings:                 LA Grade B (one or more mucosal breaks greater than                            5 mm, not extending between the tops of two mucosal                            folds) esophagitis with no bleeding was found 36 to                            38 cm from the incisors.                           One linear and superficial esophageal ulcer with no                            bleeding and no stigmata of recent bleeding was                            found 36 to 38 cm from the incisors. The lesion was                            3 mm in largest dimension. Biopsies were taken with  a cold forceps for histology.                           The gastroesophageal flap valve was visualized                            endoscopically and classified as Hill Grade II                            (fold present, opens with respiration).                           Patchy mild inflammation characterized by                            congestion (edema) and erythema was found in the                            entire examined stomach. Biopsies were taken with a                            cold forceps for Helicobacter pylori testing.                           The cardia and gastric fundus were normal on                            retroflexion.                           The examined duodenum was normal. Complications:            No immediate complications. Estimated Blood Loss:     Estimated blood loss was minimal. Impression:               - LA Grade B reflux esophagitis with no bleeding.                           - Esophageal ulcer with no bleeding and no stigmata                             of recent bleeding. Biopsied.                           - Gastroesophageal flap valve classified as Hill                            Grade II (fold present, opens with respiration).                           - Gastritis. Biopsied.                           - Normal examined duodenum. Recommendation:           - Patient has a contact number available for  emergencies. The signs and symptoms of potential                            delayed complications were discussed with the                            patient. Return to normal activities tomorrow.                            Written discharge instructions were provided to the                            patient.                           - Resume previous diet.                           - Continue present medications.                           - Await pathology results.                           - Follow an antireflux regimen.                           - Use Protonix (pantoprazole) 40 mg PO BID for 3                            months, then decrease to once daily X 1 year                           - Obtain CT abd & pelvis with contrast for further                            evaluation of LUQ and left flank pain                           - Follow up in GI office after CT scan. Mauri Pole, MD 02/28/2023 3:58:33 PM This report has been signed electronically.

## 2023-02-28 NOTE — Patient Instructions (Signed)
Discharge instructions given. Handout on Gastritis. Prescription sent to pharmacy. Office will call to schedule CT abd and pelvis with contrast for further evaluation of LUQ and left flank pain. Follow up in GI office after CT scan. Resume previous medications. YOU HAD AN ENDOSCOPIC PROCEDURE TODAY AT [facility]:   Refer to the procedure report that was given to you for any specific questions about what was found during the examination.  If the procedure report does not answer your questions, please call your gastroenterologist to clarify.  If you requested that your care partner not be given the details of your procedure findings, then the procedure report has been included in a sealed envelope for you to review at your convenience later.  YOU SHOULD EXPECT: Some feelings of bloating in the abdomen. Passage of more gas than usual.  Walking can help get rid of the air that was put into your GI tract during the procedure and reduce the bloating. If you had a lower endoscopy (such as a colonoscopy or flexible sigmoidoscopy) you may notice spotting of blood in your stool or on the toilet paper. If you underwent a bowel prep for your procedure, you may not have a normal bowel movement for a few days.  Please Note:  You might notice some irritation and congestion in your nose or some drainage.  This is from the oxygen used during your procedure.  There is no need for concern and it should clear up in a day or so.  SYMPTOMS TO REPORT IMMEDIATELY:  Following lower endoscopy (colonoscopy or flexible sigmoidoscopy):  Excessive amounts of blood in the stool  Significant tenderness or worsening of abdominal pains  Swelling of the abdomen that is new, acute  Fever of 100F or higher  Following upper endoscopy (EGD)  Vomiting of blood or coffee ground material  New chest pain or pain under the shoulder blades  Painful or persistently difficult swallowing  New shortness of breath  Fever  of 100F or higher  Black, tarry-looking stools  For urgent or emergent issues, a gastroenterologist can be reached at any hour by calling [phone]. Do not use MyChart messaging for urgent concerns.    DIET:  We do recommend a small meal at first, but then you may proceed to your regular diet.  Drink plenty of fluids but you should avoid alcoholic beverages for 24 hours.  ACTIVITY:  You should plan to take it easy for the rest of today and you should NOT DRIVE or use heavy machinery until tomorrow (because of the sedation medicines used during the test).    FOLLOW UP: Our staff will call the number listed on your records the next business day following your procedure.  We will call around 7:15- 8:00 am to check on you and address any questions or concerns that you may have regarding the information given to you following your procedure. If we do not reach you, we will leave a message.     If any biopsies were taken you will be contacted by phone or by letter within the next 1-3 weeks.  Please call us at [phone] if you have not heard about the biopsies in 3 weeks.    SIGNATURES/CONFIDENTIALITY: You and/or your care partner have signed paperwork which will be entered into your electronic medical record.  These signatures attest to the fact that that the information above on your After Visit Summary has been reviewed and is understood.  Full responsibility of the confidentiality  of this discharge information lies with you and/or your care-partner.

## 2023-02-28 NOTE — Progress Notes (Signed)
Vss nad trans to pacu °

## 2023-02-28 NOTE — Progress Notes (Signed)
Pt's states no medical or surgical changes since previsit or office visit. 

## 2023-02-28 NOTE — Progress Notes (Unsigned)
Please refer to office visit note 02/06/23. No additional changes in H&P Patient is appropriate for planned procedure(s) and anesthesia in an ambulatory setting  K. Denzil Magnuson , MD [phone]

## 2023-02-28 NOTE — Op Note (Signed)
[place] Patient Name: Rickey Cruz Procedure Date: 02/28/2023 3:17 PM MRN: [id number] Endoscopist: Mauri Pole , MD, [id number] Age: 62 Referring MD:  Date of Birth: 1961/12/14 Gender: Male Account #: [id number] Procedure:                Colonoscopy Indications:              High risk colon cancer surveillance: Personal                            history of colonic polyps, High risk colon cancer                            surveillance: Personal history of adenoma less than                            10 mm in size Medicines:                Monitored Anesthesia Care Procedure:                Pre-Anesthesia Assessment:                           - Prior to the procedure, a History and Physical                            was performed, and patient medications and                            allergies were reviewed. The patient's tolerance of                            previous anesthesia was also reviewed. The risks                            and benefits of the procedure and the sedation                            options and risks were discussed with the patient.                            All questions were answered, and informed consent                            was obtained. Prior Anticoagulants: The patient has                            taken no anticoagulant or antiplatelet agents. ASA                            Grade Assessment: III - A patient with severe                            systemic disease. After reviewing the risks and  benefits, the patient was deemed in satisfactory                            condition to undergo the procedure.                           After obtaining informed consent, the colonoscope                            was passed under direct vision. Throughout the                            procedure, the patient's blood pressure, pulse, and                            oxygen saturations were monitored  continuously. The                            Olympus [id number]) Colonoscope was                            introduced through the anus and advanced to the the                            cecum, identified by appendiceal orifice and                            ileocecal valve. The colonoscopy was performed                            without difficulty. The patient tolerated the                            procedure well. The quality of the bowel                            preparation was excellent. The ileocecal valve,                            appendiceal orifice, and rectum were photographed. Scope In: 3:34:21 PM Scope Out: 3:45:29 PM Scope Withdrawal Time: 0 hours 5 minutes 29 seconds  Total Procedure Duration: 0 hours 11 minutes 8 seconds  Findings:                 The perianal and digital rectal examinations were                            normal.                           A few small-mouthed diverticula were found in the                            sigmoid colon.  Non-bleeding external and internal hemorrhoids were                            found during retroflexion. The hemorrhoids were                            small. Complications:            No immediate complications. Estimated Blood Loss:     Estimated blood loss was minimal. Impression:               - Diverticulosis in the sigmoid colon.                           - Non-bleeding external and internal hemorrhoids.                           - No specimens collected. Recommendation:           - Patient has a contact number available for                            emergencies. The signs and symptoms of potential                            delayed complications were discussed with the                            patient. Return to normal activities tomorrow.                            Written discharge instructions were provided to the                            patient.                            - Resume previous diet.                           - Continue present medications.                           - Repeat colonoscopy in 10 years for surveillance. Mauri Pole, MD 02/28/2023 3:54:41 PM This report has been signed electronically.

## 2023-02-28 NOTE — Progress Notes (Signed)
Called to room to assist during endoscopic procedure.  Patient ID and intended procedure confirmed with present staff. Received instructions for my participation in the procedure from the performing physician.  

## 2023-03-01 ENCOUNTER — Other Ambulatory Visit: Payer: Self-pay

## 2023-03-01 ENCOUNTER — Telehealth: Payer: Self-pay

## 2023-03-01 DIAGNOSIS — R109 Unspecified abdominal pain: Secondary | ICD-10-CM

## 2023-03-01 NOTE — Telephone Encounter (Signed)
  Follow up Call-     02/28/2023    2:22 PM  Call back number  Post procedure Call Back phone  # [phone]  Permission to leave phone message Yes     Post op call attempted, no answer, left WM.

## 2023-03-02 ENCOUNTER — Encounter: Payer: Self-pay | Admitting: Gastroenterology

## 2023-03-09 ENCOUNTER — Encounter: Payer: Self-pay | Admitting: Gastroenterology

## 2023-03-12 ENCOUNTER — Ambulatory Visit (HOSPITAL_BASED_OUTPATIENT_CLINIC_OR_DEPARTMENT_OTHER)
Admission: RE | Admit: 2023-03-12 | Discharge: 2023-03-12 | Disposition: A | Discharge status: Home / Self Care | Payer: BC Managed Care – PPO | Source: Ambulatory Visit | Attending: Gastroenterology | Admitting: Gastroenterology

## 2023-03-12 DIAGNOSIS — R109 Unspecified abdominal pain: Secondary | ICD-10-CM

## 2023-03-12 MED ORDER — IOHEXOL 300 MG/ML  SOLN
100.0000 mL | Freq: Once | INTRAMUSCULAR | Status: AC | PRN
  Administered 2023-03-12: 80 mL via INTRAVENOUS

## 2023-03-15 ENCOUNTER — Encounter: Payer: Self-pay | Admitting: Emergency Medicine

## 2023-03-17 ENCOUNTER — Telehealth: Payer: Self-pay

## 2023-03-17 NOTE — Transitions of Care (Post Inpatient/ED Visit) (Signed)
   03/17/2023  Name: Canuto Patyk MRN: [id number] DOB: 1961/02/14  Today's TOC FU Call Status: Today's TOC FU Call Status:: Unsuccessul Call (1st Attempt) Unsuccessful Call (1st Attempt) Date: 03/17/23  Attempted to reach the patient regarding the most recent Inpatient/ED visit.  Follow Up Plan: Additional outreach attempts will be made to reach the patient to complete the Transitions of Care (Post Inpatient/ED visit) call.   Signature Juanda Crumble, [place] Direct Dial [phone]

## 2023-03-20 NOTE — Transitions of Care (Post Inpatient/ED Visit) (Signed)
   03/20/2023  Name: Rickey Cruz MRN: [id number] DOB: 1961/11/19  Today's TOC FU Call Status: Today's TOC FU Call Status:: Unsuccessul Call (1st Attempt) Unsuccessful Call (1st Attempt) Date: 03/17/23 [facility] FU Call Complete Date: 03/20/23  Transition Care Management Follow-up Telephone Call Date of Discharge: 03/16/23 Discharge Facility: Other ([facility]) Name of Other (Non[facility]) Discharge Facility: [facility] Type of Discharge: Emergency Department Reason for ED Visit:  (abd pain) How have you been since you were released from the hospital?: Better Any questions or concerns?: No  Items Reviewed: Did you receive and understand the discharge instructions provided?: Yes Medications obtained and verified?: Yes (Medications Reviewed) Any new allergies since your discharge?: No Dietary orders reviewed?: Yes Do you have support at home?: Yes People in Home: spouse  Home Care and Equipment/Supplies: [place] Ordered?: NA Any new equipment or medical supplies ordered?: NA  Functional Questionnaire: Do you need assistance with bathing/showering or dressing?: No Do you need assistance with meal preparation?: No Do you need assistance with eating?: No Do you have difficulty maintaining continence: No Do you need assistance with getting out of bed/getting out of a chair/moving?: No Do you have difficulty managing or taking your medications?: No  Follow up appointments reviewed: PCP Follow-up appointment confirmed?: Yes Date of PCP follow-up appointment?: 03/21/23 Follow-up Provider: [facility] Follow-up appointment confirmed?: NA Do you need transportation to your follow-up appointment?: No Do you understand care options if your condition(s) worsen?: Yes-patient verbalized understanding    [place], [place] Direct Dial [phone]

## 2023-03-21 ENCOUNTER — Encounter: Payer: Self-pay | Admitting: Emergency Medicine

## 2023-03-21 ENCOUNTER — Ambulatory Visit: Payer: BC Managed Care – PPO | Admitting: Emergency Medicine

## 2023-03-21 VITALS — BP 122/76 | HR 60 | Temp 97.9°F | Ht 65.5 in | Wt 188.0 lb

## 2023-03-21 DIAGNOSIS — C61 Malignant neoplasm of prostate: Secondary | ICD-10-CM | POA: Diagnosis not present

## 2023-03-21 DIAGNOSIS — I10 Essential (primary) hypertension: Secondary | ICD-10-CM

## 2023-03-21 DIAGNOSIS — N200 Calculus of kidney: Secondary | ICD-10-CM | POA: Diagnosis not present

## 2023-03-21 DIAGNOSIS — J4530 Mild persistent asthma, uncomplicated: Secondary | ICD-10-CM | POA: Diagnosis not present

## 2023-03-21 DIAGNOSIS — E785 Hyperlipidemia, unspecified: Secondary | ICD-10-CM

## 2023-03-21 DIAGNOSIS — R7303 Prediabetes: Secondary | ICD-10-CM

## 2023-03-21 NOTE — Assessment & Plan Note (Signed)
Well-controlled hypertension Continue amlodipine 10 mg daily BP Readings from Last 3 Encounters:  03/21/23 122/76  02/28/23 116/82  02/06/23 114/70  Cardiovascular risks associated with hypertension discussed Dietary approaches to stop hypertension discussed

## 2023-03-21 NOTE — Assessment & Plan Note (Signed)
Stable. Has appointment at the [facility] in [place] next June for follow-up

## 2023-03-21 NOTE — Assessment & Plan Note (Signed)
Stable.  Diet and nutrition discussed. Advised to decrease amount of daily carbohydrate intake and daily calories and increase amount of plant-based protein in his diet 

## 2023-03-21 NOTE — Patient Instructions (Signed)
Clculos renales Kidney Stones Los clculos renales son masas parecidas a piedras que se forman en el interior de los riones. Los riones son los rganos que producen el pis (la [place]). Un clculo renal puede desplazarse hacia otras partes de las vas urinarias, por ejemplo: Los conductos que [employer] riones con la vejiga (urteres). La vejiga. El conducto que lleva la orina hacia fuera del cuerpo (uretra). Los clculos renales puede provocar un dolor muy intenso y obstruir el paso del pis. Por lo general, el clculo sale del cuerpo a travs del pis. Es posible que un mdico deba extraerle el clculo. Cules son las causas? Los clculos renales pueden ser causados por lo siguiente: Exceso de calcio en el cuerpo. Esto puede deberse a una cantidad excesiva de hormona paratiroidea en la sangre. Cristales de cido rico en la vejiga. El cuerpo produce cido rico cuando se consumen ciertos alimentos. Estrechamiento de un urter o de ambos. Una obstruccin en los riones que existe desde el nacimiento. Cirugas del rin o los urteres en el pasado. Qu incrementa el riesgo? Es ms probable que contraiga esta afeccin si: Ya tuvo clculos renales anteriormente. Otras personas de su familia han tenido clculos renales. No bebe suficiente agua. Sigue una dieta rica en protenas, sal (sodio) o azcar. Tiene mucho sobrepeso (obesidad). Cules son los signos o sntomas? Los sntomas de clculos renales pueden incluir los siguientes: Dolor en el costado del abdomen, justo debajo de las [place]. Dolor que generalmente se expande hacia la ingle. Necesidad frecuente o inmediata de hacer pis. Dolor al hacer pis. Sangre en el pis. Sensacin de que va a vomitar (nuseas). Vmitos. Cristy Hilts y escalofros. Cmo se trata? El tratamiento depende del tamao, la ubicacin y la composicin de los clculos renales. Los clculos renales suelen salir del cuerpo al hacer pis. Es posible que deba hacer  lo siguiente: Beber ms lquido para ayudar a Insurance account manager. En algunos casos, pueden administrarle lquidos por una va intravenosa en el hospital. Tomar analgsicos. Hacer cambios en su alimentacin para ayudar a prevenir que regresen los clculos renales. A veces, usted puede necesitar: Un procedimiento para romper los clculos renales utilizando un haz de luz (lser) u ondas de choque. Ciruga para extraer los clculos renales. Siga estas indicaciones en su casa: Medicamentos Use los medicamentos de venta libre y los recetados solamente como se lo haya indicado el mdico. Pregntele al mdico si el medicamento recetado le impide conducir o usar Programme researcher, broadcasting/film/video. Comida y bebida Beba suficiente lquido como para Theatre manager la orina de color amarillo plido. Es posible que le digan que beba al [employer] 8 y 55 vasos de agua por Training and development officer. Esto lo ayudar a eliminar el clculo renal. Si el mdico se lo indica, haga cambios en su alimentacin. Es posible que le indiquen lo siguiente: Limitar la cantidad de sal que consume. Comer ms frutas y verduras. Limitar la cantidad de carne, carne de ave, pescado y [employer] consume. Siga las instrucciones del mdico sobre qu puede comer y beber. Indicaciones generales Recolecte las muestras de pis como se lo haya indicado el mdico. Deber recolectar una muestra de pis en los siguientes momentos: 24 horas despus de eliminar un clculo. Entre 8 y 12 semanas despus de haber eliminado el clculo, y cada 6 a 12 meses despus de eso. Cuele el pis cada vez que vaya al bao. Use el colador que le recomiende el mdico. No deseche el clculo. Consrvelo para que el mdico lo pueda Physiological scientist. Concurra a todas  las visitas de seguimiento. Es posible que deban hacerle radiografas y ecografas para asegurarse de que el clculo haya salido de su cuerpo. Cmo se previene? Para prevenir la formacin de otro clculo renal: Beba suficiente lquido como para mantener  la orina de color amarillo plido. Esta es la mejor manera de prevenir la formacin de clculos renales. Consuma alimentos saludables. Evite determinados alimentos segn se lo haya indicado el mdico. Es posible que le indiquen que consuma menos protenas. Mantenga un peso saludable. Dnde obtener ms informacin [employer]) ([place]): kidney. ([facility]) (Fundacin de Cuidados Urolgicos): [email or web address].org Comunquese con un mdico si: Tiene un dolor que empeora o que no mejora con los medicamentos. Solicite ayuda de inmediato si: Tiene fiebre o escalofros. Siente un dolor muy intenso. Tiene un dolor nuevo en el vientre. Se desmaya. No puede hacer pis. Esta informacin no tiene Marine scientist el consejo del mdico. Asegrese de hacerle al mdico cualquier pregunta que tenga. Document Revised: 09/12/2022 Document Reviewed: 09/12/2022 Elsevier Patient Education  [place].

## 2023-03-21 NOTE — Progress Notes (Signed)
[facility] 62 y.o.   Chief Complaint  Patient presents with   Follow-up    Er follow up    HISTORY OF PRESENT ILLNESS: This is a 62 y.o. male here for follow-up of emergency department visit on 03/16/2023 Discharge instructions as follows: Discharge Instructions Rickey Cruz, [place] - 03/16/2023 8:01 PM EDT   Formatting of this note might be different from the original. You were seen in the emergency department today for evaluation secondary to flank pain with a known kidney stone on the left side with recent hydronephrosis 4 days ago. Your kidney function today was normal. The ultrasound showed that the hydronephrosis which was previously noted has since resolved. There were no noted stones noted per ultrasound however ultrasound specifically in some cases can miss smaller stones hence the stone may still be present. Please ensure that you are flushing your kidneys with water. Take the Flomax daily for the next 7-10 days as this will help to relax the ureters (tubes which come from the kidneys and drain into the bladder). You may continue to take your previously prescribed pain and nausea medications if needed. Use the urine strainer when urinating. Keep your upcoming appointment with urology at the beginning of next month.  If you experience any high fevers, severe pain in your abdomen or back, new urinary retention, large amount of blood in your urine or other concerning symptoms please seek immediate reevaluation. It was our pleasure caring for you today.  Electronically signed by Rickey Cruz, [place] at 03/16/2023 8:04 PM EDT  HPI   Prior to Admission medications   Medication Sig Start Date End Date Taking? Authorizing Provider  albuterol (VENTOLIN HFA) 108 (90 Base) MCG/ACT inhaler Inhale 2 puffs into the lungs every 6 (six) hours as needed for wheezing or shortness of breath.   Yes [provider]  amLODipine (NORVASC) 10 MG tablet Take 1 tablet (10 mg total)  by mouth daily. 02/13/23  Yes Larisha Vencill, Ines Bloomer, MD  azelastine (ASTELIN) 0.1 % nasal spray Place 2 sprays into both nostrils 2 (two) times daily as needed for rhinitis. Use in each nostril as directed   Yes [provider]  finasteride (PROSCAR) 5 MG tablet Take 5 mg by mouth daily. 01/15/23  Yes [provider]  fluocinonide cream (LIDEX) AB-123456789 % Apply 1 Application topically 2 (two) times daily. 02/13/23  Yes Glenn Gullickson, Ines Bloomer, MD  HYDROcodone-acetaminophen [facility]) 7.5-325 MG tablet Take 1 tablet by mouth as needed. 12/31/22  Yes [provider]  hydrocortisone (ANUSOL-HC) 2.5 % rectal cream APPLY RECTALLY TO THE AFFECTED AREA TWICE DAILY 12/06/22  Yes Aimie Wagman, Ines Bloomer, MD  ibuprofen (ADVIL) 600 MG tablet Take 600 mg by mouth as needed.   Yes [provider]  ketoconazole (NIZORAL) 2 % shampoo Apply 1 Application topically as needed. 01/04/23  Yes [provider]  montelukast (SINGULAIR) 10 MG tablet Take 10 mg by mouth at bedtime.   Yes [provider]  Olopatadine HCl (PATADAY OP) Apply 1 drop to eye as needed.   Yes [provider]  pantoprazole (PROTONIX) 40 MG tablet Take 1 tablet (40 mg total) by mouth 2 (two) times daily. 02/28/23  Yes Nandigam, Venia Minks, MD  rosuvastatin (CRESTOR) 10 MG tablet Take 1 tablet (10 mg total) by mouth daily. 10/26/22  Yes Madalyne Husk, Ines Bloomer, MD  tamsulosin (FLOMAX) 0.4 MG CAPS capsule Take 1 capsule (0.4 mg total) by mouth at bedtime. 05/24/22  Yes Robley Fries, MD  clobetasol ointment (TEMOVATE) [id number] % Apply 1 application  topically 2 (two) times daily as needed (psoriasis). Patient not taking: Reported on 03/20/2023 05/30/22   [provider]    No Known Allergies  Patient Active Problem List   Diagnosis Date Noted   Mild persistent asthma without complication 123456   COVID-19 virus infection 11/03/2022   Pain in joint of right ankle 07/12/2022   DM2 (diabetes  mellitus, type 2) ([place]) 06/01/2022   Obstructive uropathy 05/21/2022   Moderate persistent asthma with acute exacerbation 04/28/2022   History of prostate cancer 04/25/2022   History of kidney stones 04/25/2022   Malignant neoplasm of prostate ([place]) 04/19/2022   Enlarged prostate 01/12/2022   Seasonal and perennial allergic rhinoconjunctivitis 04/05/2021   Prediabetes 04/02/2019   Dyslipidemia 12/23/2014   Psoriasis 06/27/2014   Multiple lung nodules 12/09/2011   PULMONARY HYPERTENSION 03/04/2010   Essential hypertension 02/10/2010   OBSTRUCTIVE SLEEP APNEA 02/04/2010    Past Medical History:  Diagnosis Date   Allergy    Flonase, Patanol, Zyrtec   Arthritis    psoriasis   Asthma    seasonal, with allergies   Borderline diabetes    Cluster headache    Colon polyp 10/27/2011   Depression    denies 11/11/16; denies 11/21/18 "been years"   Eczema    GERD (gastroesophageal reflux disease)    History of kidney stones    x1   Hyperlipidemia    Hypertension    Low back pain    "in kidney area-was told that it was related to gallstones"   Prediabetes    Prostate cancer ([place])    Psoriasis    [name]   Severe sleep apnea    h/o; had procedure and weight control, does not have to wear CPAP   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 Repeat 5 years.   CYSTOSCOPY WITH RETROGRADE PYELOGRAM, URETEROSCOPY AND STENT PLACEMENT Left 05/22/2022   Procedure: CYSTOSCOPY WITH RETROGRADE PYELOGRAM, URETEROSCOPY AND STENT PLACEMENT; TRANSURETHRAL RESECTION OF PROSTATE;  Surgeon: Janith Lima, MD;  Location: [facility];  Service: Urology;  Laterality: Left;    CYSTOSCOPY WITH RETROGRADE PYELOGRAM, URETEROSCOPY AND STENT PLACEMENT Left 06/07/2022   Procedure: CYSTOSCOPY WITH RETROGRADE PYELOGRAM, URETEROSCOPY AND STENT EXCHANGE;  Surgeon: Robley Fries, MD;  Location: [facility];  Service: Urology;  Laterality: Left;  90 MINS   EXTRACORPOREAL SHOCK WAVE LITHOTRIPSY Left 05/09/2022   Procedure: EXTRACORPOREAL SHOCK WAVE LITHOTRIPSY (ESWL);  Surgeon: Alexis Frock, MD;  [facility];  Service: Urology;  Laterality: Left;   HOLMIUM LASER APPLICATION Left 0000000   Procedure: HOLMIUM LASER APPLICATION;  Surgeon: Robley Fries, MD;  Location: [facility];  Service: Urology;  Laterality: Left;   POLYPECTOMY     PROSTATE BIOPSY  10/2014   will have another in 3 months   SEPTOPLASTY  2014   TONSILLECTOMY  2014   TRANSURETHRAL RESECTION OF PROSTATE N/A 06/07/2022   Procedure: TRANSURETHRAL RESECTION OF THE PROSTATE (TURP);  Surgeon: Robley Fries, MD;  Location: [facility];  Service: Urology;  Laterality: N/A;   UVULOPALATOPHARYNGOPLASTY  2014    Social History   Socioeconomic History  Marital status: Divorced    Spouse name: Not on file   Number of children: 1   Years of education: Not on file   Highest education level: Doctorate  Occupational History   Occupation: Product manager: [employer]    Comment: Pharmacist, community  Tobacco Use   Smoking status: Never   Smokeless tobacco: Never  Vaping Use   Vaping Use: Never used  Substance and Sexual Activity   Alcohol use: Never    Alcohol/week: 0.0 standard drinks of alcohol   Drug use: Never   Sexual activity: Yes    Comment: SSP  Other Topics Concern   Not on file  Social History Narrative   Original from [place]; moved to [place] 1995.   Marital status: same sexual partner 4 X years.   Children:  1 child/son (18) at 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        Tobacco: never      Alcohol: none      Drugs: none       Exercise: daily in 2018; 10,000 steps daily; lots of walking on campus at work      ADLs: independent with ADLs.        Sexual activity: dating males only in 2018; bisexual.  Previously married to male.       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         Social Determinants of Health   Financial Resource Strain: Not on file  Food Insecurity: Not on file  Transportation Needs: Not on file  Physical Activity: Not on file  Stress: Not on file  Social Connections: Not on file  Intimate Partner Violence: Not on file    Family History  Problem Relation Age of Onset   Stroke Mother    Asthma Sister    Colon cancer Neg Hx    Esophageal cancer Neg Hx    Stomach cancer Neg Hx    Prostate cancer Neg Hx    Diabetes Neg Hx    CAD Neg Hx    Migraines Neg Hx    Headache Neg Hx      Review of Systems  Constitutional: Negative.  Negative for chills and fever.  HENT: Negative.  Negative for congestion and sore throat.   Respiratory: Negative.  Negative for cough and shortness of breath.   Cardiovascular: Negative.  Negative for chest pain and palpitations.  Gastrointestinal:  Negative for abdominal pain, nausea and vomiting.  Genitourinary:  Positive for flank pain.  Skin: Negative.  Negative for rash.  Neurological: Negative.  Negative for dizziness and headaches.  All other systems reviewed and are negative.   Vitals:   03/21/23 1448  BP: 122/76  Pulse: 60  Temp: 97.9 F (36.6 C)  SpO2: 97%    Physical Exam Vitals reviewed.  Constitutional:      Appearance: Normal appearance.  HENT:     Head: Normocephalic.   Eyes:     Extraocular Movements: Extraocular movements intact.  Cardiovascular:     Rate and Rhythm: Normal rate.  Pulmonary:     Effort: Pulmonary  effort is normal.  Abdominal:     Palpations: Abdomen is soft.     Tenderness: There is no abdominal tenderness. There is no right CVA tenderness or left CVA tenderness.  Skin:    General: Skin is warm and dry.     Capillary Refill: Capillary refill takes less than 2 seconds.  Neurological:     General: No focal deficit present.     Mental Status: He is alert and oriented to person, place, and time.  Psychiatric:        Mood and Affect: Mood normal.        Behavior: Behavior normal.      ASSESSMENT & PLAN: A total of 47 minutes was spent with the patient and counseling/coordination of care regarding preparing for this visit, review of most recent office visit notes, review of most recent emergency department visit notes, review of most recent kidney ultrasound report, review of most recent blood work results, review of multiple chronic medical conditions and their management, review of all medications, education on nutrition, prognosis, documentation, and need for follow-up.  Problem List Items Addressed This Visit       Cardiovascular and Mediastinum   Essential hypertension    Well-controlled hypertension Continue amlodipine 10 mg daily BP Readings from Last 3 Encounters:  03/21/23 122/76  02/28/23 116/82  02/06/23 114/70  Cardiovascular risks associated with hypertension discussed Dietary approaches to stop hypertension discussed        Respiratory   Mild persistent asthma without complication    Stable and well-controlled No recent use of albuterol        Genitourinary   Malignant neoplasm of prostate (HCC)    Stable. Has appointment at the [facility] in [place] next June for follow-up      Kidney stone - Primary    Stable and well-controlled.  Asymptomatic today No hydronephrosis seen in kidney ultrasound  done last week Normal kidney function tests Continues Flomax 0.4 mg daily Pain well-controlled Pain management discussed        Other   Dyslipidemia    Stable chronic condition Continues rosuvastatin 10 mg daily Diet and nutrition discussed The 10-year ASCVD risk score (Arnett DK, et al., 2019) is: 22.3%   Values used to calculate the score:     Age: 42 years     Sex: Male     Is Non-Hispanic African American: No     Diabetic: Yes     Tobacco smoker: No     Systolic Blood Pressure: 123XX123 mmHg     Is BP treated: Yes     HDL Cholesterol: 41.7 mg/dL     Total Cholesterol: 219 mg/dL       Prediabetes    Stable.  Diet and nutrition discussed. Advised to decrease amount of daily carbohydrate intake and daily calories and increase amount of plant-based protein in his diet.      Patient Instructions  Clculos renales Kidney Stones Los clculos renales son masas parecidas a piedras que se forman en el interior de los riones. Los riones son los rganos que producen el pis (la Albin). Un clculo renal puede desplazarse hacia otras partes de las vas urinarias, por ejemplo: Los conductos que [employer] riones con la vejiga (urteres). La vejiga. El conducto que lleva la orina hacia fuera del cuerpo (uretra). Los clculos renales puede provocar un dolor muy intenso y obstruir el paso del pis. Por lo general, el clculo sale del cuerpo a travs del pis. Es posible que  un mdico deba extraerle el clculo. Cules son las causas? Los clculos renales pueden ser causados por lo siguiente: Exceso de calcio en el cuerpo. Esto puede deberse a una cantidad excesiva de hormona paratiroidea en la sangre. Cristales de cido rico en la vejiga. El cuerpo produce cido rico cuando se consumen ciertos alimentos. Estrechamiento de un urter o de ambos. Una obstruccin en los riones que existe desde el nacimiento. Cirugas del rin o los urteres en el pasado. Qu incrementa el riesgo? Es  ms probable que contraiga esta afeccin si: Ya tuvo clculos renales anteriormente. Otras personas de su familia han tenido clculos renales. No bebe suficiente agua. Sigue una dieta rica en protenas, sal (sodio) o azcar. Tiene mucho sobrepeso (obesidad). Cules son los signos o sntomas? Los sntomas de clculos renales pueden incluir los siguientes: Dolor en el costado del abdomen, justo debajo de las [place]. Dolor que generalmente se expande hacia la ingle. Necesidad frecuente o inmediata de hacer pis. Dolor al hacer pis. Sangre en el pis. Sensacin de que va a vomitar (nuseas). Vmitos. Cristy Hilts y escalofros. Cmo se trata? El tratamiento depende del tamao, la ubicacin y la composicin de los clculos renales. Los clculos renales suelen salir del cuerpo al hacer pis. Es posible que deba hacer lo siguiente: Beber ms lquido para ayudar a Insurance account manager. En algunos casos, pueden administrarle lquidos por una va intravenosa en el hospital. Tomar analgsicos. Hacer cambios en su alimentacin para ayudar a prevenir que regresen los clculos renales. A veces, usted puede necesitar: Un procedimiento para romper los clculos renales utilizando un haz de luz (lser) u ondas de choque. Ciruga para extraer los clculos renales. Siga estas indicaciones en su casa: Medicamentos Use los medicamentos de venta libre y los recetados solamente como se lo haya indicado el mdico. Pregntele al mdico si el medicamento recetado le impide conducir o usar Programme researcher, broadcasting/film/video. Comida y bebida Beba suficiente lquido como para Theatre manager la orina de color amarillo plido. Es posible que le digan que beba al [employer] 8 y 13 vasos de agua por Training and development officer. Esto lo ayudar a eliminar el clculo renal. Si el mdico se lo indica, haga cambios en su alimentacin. Es posible que le indiquen lo siguiente: Limitar la cantidad de sal que consume. Comer ms frutas y verduras. Limitar la cantidad de carne,  carne de ave, pescado y [employer] consume. Siga las instrucciones del mdico sobre qu puede comer y beber. Indicaciones generales Recolecte las muestras de pis como se lo haya indicado el mdico. Deber recolectar una muestra de pis en los siguientes momentos: 24 horas despus de eliminar un clculo. Entre 8 y 12 semanas despus de haber eliminado el clculo, y cada 6 a 12 meses despus de eso. Cuele el pis cada vez que vaya al bao. Use el colador que le recomiende el mdico. No deseche el clculo. Consrvelo para que el mdico lo pueda Physiological scientist. Concurra a Yoder. Es posible que deban hacerle radiografas y ecografas para asegurarse de que el clculo haya salido de su cuerpo. Cmo se previene? Para prevenir la formacin de otro clculo renal: Beba suficiente lquido como para mantener la orina de color amarillo plido. Esta es la mejor manera de prevenir la formacin de clculos renales. Consuma alimentos saludables. Evite determinados alimentos segn se lo haya indicado el mdico. Es posible que le indiquen que consuma menos protenas. Mantenga un peso saludable. Dnde obtener ms informacin [employer] ([employer]) ([place]): kidney.org Urology  Care Foundation (UCF) (Fundacin de Cuidados Urolgicos): [email or web address].org Comunquese con un mdico si: Tiene un dolor que empeora o que no mejora con los medicamentos. Solicite ayuda de inmediato si: Tiene fiebre o escalofros. Siente un dolor muy intenso. Tiene un dolor nuevo en el vientre. Se desmaya. No puede hacer pis. Esta informacin no tiene Marine scientist el consejo del mdico. Asegrese de hacerle al mdico cualquier pregunta que tenga. Document Revised: 09/12/2022 Document Reviewed: 09/12/2022 Elsevier Patient Education  [place], MD [facility]

## 2023-03-21 NOTE — Assessment & Plan Note (Signed)
Stable and well-controlled No recent use of albuterol

## 2023-03-21 NOTE — Assessment & Plan Note (Signed)
Stable and well-controlled.  Asymptomatic today No hydronephrosis seen in kidney ultrasound done last week Normal kidney function tests Continues Flomax 0.4 mg daily Pain well-controlled Pain management discussed

## 2023-03-21 NOTE — Assessment & Plan Note (Signed)
Stable chronic condition Continues rosuvastatin 10 mg daily Diet and nutrition discussed The 10-year ASCVD risk score (Arnett DK, et al., 2019) is: 22.3%   Values used to calculate the score:     Age: 62 years     Sex: Male     Is Non-Hispanic African American: No     Diabetic: Yes     Tobacco smoker: No     Systolic Blood Pressure: 123XX123 mmHg     Is BP treated: Yes     HDL Cholesterol: 41.7 mg/dL     Total Cholesterol: 219 mg/dL

## 2023-04-06 ENCOUNTER — Other Ambulatory Visit: Payer: Self-pay | Admitting: Emergency Medicine

## 2023-04-19 ENCOUNTER — Other Ambulatory Visit: Payer: Self-pay | Admitting: Urology

## 2023-04-20 ENCOUNTER — Encounter (HOSPITAL_BASED_OUTPATIENT_CLINIC_OR_DEPARTMENT_OTHER): Payer: Self-pay | Admitting: Urology

## 2023-04-20 NOTE — Progress Notes (Signed)
Spoke w/ via phone for pre-op interview---Rickey Cruz needs dos---- NONE              Cruz results------Current EKG in Epic dated 06/03/22. COVID test -----patient states asymptomatic no test needed Arrive at -------0530 NPO after MN NO Solid Food.   Med rec completed Medications to take morning of surgery -----Albuterol, Norvasc, Proscar,Protonix and Flomax. Diabetic medication ----- Patient instructed no nail polish to be worn day of surgery Patient instructed to bring photo id and insurance card day of surgery Patient aware to have Driver (ride ) / caregiver Son [name]   for 24 hours after surgery  Patient Special Instructions ----- Pre-Op special Instructions ----- Patient verbalized understanding of instructions that were given at this phone interview. Patient denies shortness of breath, chest pain, fever, cough at this phone interview.

## 2023-04-26 ENCOUNTER — Ambulatory Visit: Payer: BC Managed Care – PPO | Admitting: Emergency Medicine

## 2023-04-27 ENCOUNTER — Other Ambulatory Visit: Payer: Self-pay

## 2023-04-27 ENCOUNTER — Ambulatory Visit (HOSPITAL_BASED_OUTPATIENT_CLINIC_OR_DEPARTMENT_OTHER): Payer: BC Managed Care – PPO | Admitting: Anesthesiology

## 2023-04-27 ENCOUNTER — Encounter (HOSPITAL_BASED_OUTPATIENT_CLINIC_OR_DEPARTMENT_OTHER): Payer: Self-pay | Admitting: Urology

## 2023-04-27 ENCOUNTER — Encounter (HOSPITAL_BASED_OUTPATIENT_CLINIC_OR_DEPARTMENT_OTHER)
Admission: RE | Disposition: A | Discharge status: Home / Self Care | Payer: Self-pay | Source: Home / Self Care | Attending: Urology

## 2023-04-27 ENCOUNTER — Ambulatory Visit: Payer: BC Managed Care – PPO

## 2023-04-27 ENCOUNTER — Ambulatory Visit (HOSPITAL_BASED_OUTPATIENT_CLINIC_OR_DEPARTMENT_OTHER)
Admission: RE | Admit: 2023-04-27 | Discharge: 2023-04-27 | Disposition: A | Discharge status: Home / Self Care | Payer: BC Managed Care – PPO | Attending: Urology | Admitting: Urology

## 2023-04-27 DIAGNOSIS — Z79899 Other long term (current) drug therapy: Secondary | ICD-10-CM | POA: Diagnosis not present

## 2023-04-27 DIAGNOSIS — K219 Gastro-esophageal reflux disease without esophagitis: Secondary | ICD-10-CM | POA: Insufficient documentation

## 2023-04-27 DIAGNOSIS — N400 Enlarged prostate without lower urinary tract symptoms: Secondary | ICD-10-CM

## 2023-04-27 DIAGNOSIS — I10 Essential (primary) hypertension: Secondary | ICD-10-CM | POA: Insufficient documentation

## 2023-04-27 DIAGNOSIS — N201 Calculus of ureter: Secondary | ICD-10-CM | POA: Insufficient documentation

## 2023-04-27 DIAGNOSIS — N200 Calculus of kidney: Secondary | ICD-10-CM

## 2023-04-27 HISTORY — PX: CYSTOSCOPY/URETEROSCOPY/HOLMIUM LASER/STENT PLACEMENT: SHX6546

## 2023-04-27 SURGERY — CYSTOSCOPY/URETEROSCOPY/HOLMIUM LASER/STENT PLACEMENT
Anesthesia: General | Site: Ureter | Laterality: Left

## 2023-04-27 MED ORDER — PROPOFOL 10 MG/ML IV BOLUS
INTRAVENOUS | Status: AC
  Filled 2023-04-27: qty 20

## 2023-04-27 MED ORDER — FENTANYL CITRATE (PF) 100 MCG/2ML IJ SOLN
INTRAMUSCULAR | Status: AC
  Filled 2023-04-27: qty 2

## 2023-04-27 MED ORDER — ACETAMINOPHEN 160 MG/5ML PO SOLN: 325.0000 mg | ORAL | Status: DC | PRN

## 2023-04-27 MED ORDER — KETOROLAC TROMETHAMINE 15 MG/ML IJ SOLN
INTRAMUSCULAR | Status: DC | PRN
  Administered 2023-04-27: 15 mg via INTRAVENOUS

## 2023-04-27 MED ORDER — OXYCODONE HCL 5 MG PO TABS
ORAL_TABLET | ORAL | Status: AC
  Filled 2023-04-27: qty 1

## 2023-04-27 MED ORDER — ONDANSETRON HCL 4 MG/2ML IJ SOLN
INTRAMUSCULAR | Status: AC
  Filled 2023-04-27: qty 2

## 2023-04-27 MED ORDER — FENTANYL CITRATE (PF) 100 MCG/2ML IJ SOLN: 25.0000 ug | INTRAMUSCULAR | Status: DC | PRN

## 2023-04-27 MED ORDER — SUGAMMADEX SODIUM 200 MG/2ML IV SOLN
INTRAVENOUS | Status: DC | PRN
  Administered 2023-04-27: 200 mg via INTRAVENOUS

## 2023-04-27 MED ORDER — SODIUM CHLORIDE 0.9 % IR SOLN
Status: DC | PRN
  Administered 2023-04-27 (×3): 3000 mL

## 2023-04-27 MED ORDER — CIPROFLOXACIN IN D5W 400 MG/200ML IV SOLN
400.0000 mg | INTRAVENOUS | Status: AC
  Administered 2023-04-27: 400 mg via INTRAVENOUS

## 2023-04-27 MED ORDER — EPHEDRINE SULFATE (PRESSORS) 50 MG/ML IJ SOLN
INTRAMUSCULAR | Status: DC | PRN
  Administered 2023-04-27: 10 mg via INTRAVENOUS

## 2023-04-27 MED ORDER — ROCURONIUM BROMIDE 10 MG/ML (PF) SYRINGE
PREFILLED_SYRINGE | INTRAVENOUS | Status: AC
  Filled 2023-04-27: qty 10

## 2023-04-27 MED ORDER — LIDOCAINE HCL (CARDIAC) PF 100 MG/5ML IV SOSY
PREFILLED_SYRINGE | INTRAVENOUS | Status: DC | PRN
  Administered 2023-04-27: 60 mg via INTRAVENOUS

## 2023-04-27 MED ORDER — OXYCODONE HCL 5 MG/5ML PO SOLN: 5.0000 mg | Freq: Once | ORAL | Status: AC | PRN

## 2023-04-27 MED ORDER — LIDOCAINE HCL (PF) 2 % IJ SOLN
INTRAMUSCULAR | Status: AC
  Filled 2023-04-27: qty 5

## 2023-04-27 MED ORDER — LACTATED RINGERS IV SOLN: INTRAVENOUS | Status: DC

## 2023-04-27 MED ORDER — ACETAMINOPHEN 10 MG/ML IV SOLN: 1000.0000 mg | Freq: Once | INTRAVENOUS | Status: DC | PRN

## 2023-04-27 MED ORDER — PROPOFOL 10 MG/ML IV BOLUS
INTRAVENOUS | Status: DC | PRN
  Administered 2023-04-27: 200 mg via INTRAVENOUS

## 2023-04-27 MED ORDER — ACETAMINOPHEN 325 MG PO TABS: 325.0000 mg | ORAL_TABLET | ORAL | Status: DC | PRN

## 2023-04-27 MED ORDER — MIDAZOLAM HCL 5 MG/5ML IJ SOLN
INTRAMUSCULAR | Status: DC | PRN
  Administered 2023-04-27: 2 mg via INTRAVENOUS

## 2023-04-27 MED ORDER — CIPROFLOXACIN IN D5W 400 MG/200ML IV SOLN
INTRAVENOUS | Status: AC
  Filled 2023-04-27: qty 200

## 2023-04-27 MED ORDER — PROMETHAZINE HCL 25 MG/ML IJ SOLN: 6.2500 mg | INTRAMUSCULAR | Status: DC | PRN

## 2023-04-27 MED ORDER — ONDANSETRON HCL 4 MG/2ML IJ SOLN
INTRAMUSCULAR | Status: DC | PRN
  Administered 2023-04-27: 4 mg via INTRAVENOUS

## 2023-04-27 MED ORDER — DEXAMETHASONE SODIUM PHOSPHATE 10 MG/ML IJ SOLN
INTRAMUSCULAR | Status: AC
  Filled 2023-04-27: qty 1

## 2023-04-27 MED ORDER — DEXAMETHASONE SODIUM PHOSPHATE 4 MG/ML IJ SOLN
INTRAMUSCULAR | Status: DC | PRN
  Administered 2023-04-27: 10 mg via INTRAVENOUS

## 2023-04-27 MED ORDER — AMISULPRIDE (ANTIEMETIC) 5 MG/2ML IV SOLN: 10.0000 mg | Freq: Once | INTRAVENOUS | Status: DC | PRN

## 2023-04-27 MED ORDER — FENTANYL CITRATE (PF) 100 MCG/2ML IJ SOLN
INTRAMUSCULAR | Status: DC | PRN
  Administered 2023-04-27 (×3): 25 ug via INTRAVENOUS
  Administered 2023-04-27: 50 ug via INTRAVENOUS
  Administered 2023-04-27: 25 ug via INTRAVENOUS
  Administered 2023-04-27: 50 ug via INTRAVENOUS

## 2023-04-27 MED ORDER — IOHEXOL 300 MG/ML  SOLN
INTRAMUSCULAR | Status: DC | PRN
  Administered 2023-04-27: 20 mL via URETHRAL

## 2023-04-27 MED ORDER — OXYCODONE HCL 5 MG PO TABS
5.0000 mg | ORAL_TABLET | Freq: Once | ORAL | Status: AC | PRN
  Administered 2023-04-27: 5 mg via ORAL

## 2023-04-27 MED ORDER — MIDAZOLAM HCL 2 MG/2ML IJ SOLN
INTRAMUSCULAR | Status: AC
  Filled 2023-04-27: qty 2

## 2023-04-27 MED ORDER — ROCURONIUM BROMIDE 100 MG/10ML IV SOLN
INTRAVENOUS | Status: DC | PRN
  Administered 2023-04-27: 60 mg via INTRAVENOUS
  Administered 2023-04-27: 20 mg via INTRAVENOUS

## 2023-04-27 MED ORDER — CIPROFLOXACIN HCL 500 MG PO TABS: 500.0000 mg | ORAL_TABLET | Freq: Once | ORAL | 0 refills | Status: AC

## 2023-04-27 MED ORDER — KETOROLAC TROMETHAMINE 30 MG/ML IJ SOLN
INTRAMUSCULAR | Status: AC
  Filled 2023-04-27: qty 1

## 2023-04-27 SURGICAL SUPPLY — 23 items
APL SKNCLS STERI-STRIP NONHPOA (GAUZE/BANDAGES/DRESSINGS) ×2
BAG DRAIN URO-CYSTO SKYTR STRL (DRAIN) ×2 IMPLANT
BAG DRN UROCATH (DRAIN) ×2
BENZOIN TINCTURE PRP APPL 2/3 (GAUZE/BANDAGES/DRESSINGS) ×1 IMPLANT
CATH URETERAL DUAL LUMEN 10F (MISCELLANEOUS) ×1 IMPLANT
CATH URETL OPEN 5X70 (CATHETERS) ×2 IMPLANT
CLOTH BEACON ORANGE TIMEOUT ST (SAFETY) ×2 IMPLANT
DRSG TEGADERM 2-3/8X2-3/4 SM (GAUZE/BANDAGES/DRESSINGS) ×1 IMPLANT
GLOVE BIO SURGEON STRL SZ7 (GLOVE) ×2 IMPLANT
GLOVE BIO SURGEON STRL SZ7.5 (GLOVE) ×2 IMPLANT
GOWN SRG XL LVL 4 BRTHBL STRL (GOWNS) ×1 IMPLANT
GOWN STRL NON-REIN XL LVL4 (GOWNS) ×2
GOWN STRL REUS W/TWL XL LVL3 (GOWN DISPOSABLE) ×2 IMPLANT
GUIDEWIRE STR DUAL SENSOR (WIRE) ×3 IMPLANT
IV NS IRRIG 3000ML ARTHROMATIC (IV SOLUTION) ×7 IMPLANT
KIT TURNOVER CYSTO (KITS) ×2 IMPLANT
MANIFOLD NEPTUNE II (INSTRUMENTS) ×2 IMPLANT
PACK CYSTO (CUSTOM PROCEDURE TRAY) ×2 IMPLANT
SHEATH NAVIGATOR HD 11/13X36 (SHEATH) ×1 IMPLANT
SLEEVE SCD COMPRESS KNEE MED (STOCKING) ×2 IMPLANT
STENT URET 6FRX24 CONTOUR (STENTS) ×1 IMPLANT
TUBE CONNECTING 12X1/4 (SUCTIONS) ×1 IMPLANT
TUBING UROLOGY SET (TUBING) ×2 IMPLANT

## 2023-04-27 NOTE — Interval H&P Note (Signed)
History and Physical Interval Note:  04/27/2023 7:34 AM  Rickey Cruz  has presented today for surgery, with the diagnosis of LEFT URETERAL CALCULI.  The various methods of treatment have been discussed with the patient and family. After consideration of risks, benefits and other options for treatment, the patient has consented to  Procedure(s): CYSTOSCOPY LEFT URETEROSCOPY, HOLMIUM LASER LITHOTRIPSY, STONE EXTRACTION, LEFT URETERAL STENT PLACEMENT (Left) POSSIBLE TRANSURETHRAL RESECTION OF THE PROSTATE (TURP) (N/A) as a surgical intervention.  The patient's history has been reviewed, patient examined, no change in status, stable for surgery.  I have reviewed the patient's chart and labs.  Questions were answered to the patient's satisfaction.     [name]

## 2023-04-27 NOTE — Anesthesia Procedure Notes (Signed)
Procedure Name: Intubation Date/Time: 04/27/2023 7:44 AM  Performed by: Jessica Priest, CRNAPre-anesthesia Checklist: Patient identified, Emergency Drugs available, Suction available, Patient being monitored and Timeout performed Patient Re-evaluated:Patient Re-evaluated prior to induction Oxygen Delivery Method: Circle system utilized Preoxygenation: Pre-oxygenation with 100% oxygen Induction Type: IV induction Ventilation: Mask ventilation without difficulty Laryngoscope Size: Mac and 4 Grade View: Grade II Tube type: Oral Tube size: 7.0 mm Number of attempts: 1 Airway Equipment and Method: Stylet and Oral airway Placement Confirmation: ETT inserted through vocal cords under direct vision, positive ETCO2, breath sounds checked- equal and bilateral and CO2 detector Secured at: 23 cm Tube secured with: Tape Dental Injury: Teeth and Oropharynx as per pre-operative assessment  Comments: Ramped with grey shoulder support and foam headrest

## 2023-04-27 NOTE — H&P (Signed)
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   Interval: Today the patient is here with a renal ultrasound prior to his appointment. He was last seen 1 month ago and had a CT scan that demonstrated a obstructing left distal ureteral stone with hydronephrosis. He has failed medical expulsion therapy. He is complaining of left flank pain and nausea.     ALLERGIES: seasonal allergies    MEDICATIONS: Crestor  Finasteride 5 mg tablet 1 tablet PO Daily  Tamsulosin Hcl 0.4 mg capsule 1 capsule PO Q HS  Acid Controller  Amlodipine Besylate  Hydrocodone-Acetaminophen 7.5 mg-325 mg tablet 1 tablet PO Q 6 H PRN  Ondansetron Odt 8 mg tablet,disintegrating 1 tab Q8 PRN nausea from kidney stone  Oxycodone-Acetaminophen 5 mg-325 mg tablet 1 tab Q6 PRN severe kidney stone pain  Pantoprazole Sodium 40 mg tablet, delayed release     GU PSH: Cysto Dilate Ureteral Stricture - 06/07/2022 Cystoscopy Insert Stent, Left - 05/22/2022 Cystoscopy TURP - 05/22/2022 Cystoscopy Ureteroscopy, Left - 05/22/2022 ESWL - 05/09/2022 Locm 300-399Mg /Ml Iodine,1Ml - 10/12/2022 Prostate Needle Biopsy - 10/11/2022, 03/23/2022 Remove Prostate Regrowth - 06/07/2022 Ureteroscopic laser litho, Left - 06/07/2022       PSH Notes: Septoplasty, Tonsillectomy, Appendectomy, Monitor Apnea   NON-GU PSH: Appendectomy - 2015 Deviated Septum Surgery - 2015 Remove Tonsils - 2015 Surgical Pathology, Gross And Microscopic Examination For Prostate Needle - 10/11/2022, 03/23/2022     GU PMH: Incomplete bladder emptying - 03/14/2023, - 05/31/2022 Prostate Cancer - 03/14/2023, - 10/20/2022 Ureteral calculus - 03/14/2023, - 01/23/2023    NON-GU PMH: None   FAMILY HISTORY:  Death - Mother   SOCIAL HISTORY: Marital Status: Divorced Preferred Language: English; Ethnicity: ; Race: Other Race Current Smoking Status: Patient has never smoked.  Does not drink anymore.  Drinks 1 caffeinated drink per day. Patient's occupation Runner, broadcasting/film/video.     Notes: Never a smoker, Occupation, Caffeine use, Divorced, Number of children, Mother alive and healthy, Father deceased, Alcohol use   REVIEW OF SYSTEMS:    GU Review Male:   Patient denies frequent urination, hard to postpone urination, burning/ pain with urination, get up at night to urinate, leakage of urine, stream starts and stops, trouble starting your stream, have to strain to urinate , erection problems, and penile pain.  Gastrointestinal (Upper):   Patient denies nausea, vomiting, and indigestion/ heartburn.  Gastrointestinal (Lower):   Patient denies diarrhea and constipation.  Constitutional:   Patient denies fever, night sweats, weight loss, and fatigue.  Skin:   Patient denies skin rash/ lesion and itching.  Eyes:   Patient denies blurred vision and double vision.  Ears/ Nose/ Throat:   Patient denies sore throat and sinus problems.  Hematologic/Lymphatic:   Patient denies easy bruising and swollen glands.  Cardiovascular:   Patient denies leg swelling and chest pains.  Respiratory:   Patient denies cough and shortness of breath.  Endocrine:   Patient denies excessive thirst.  Musculoskeletal:   Patient reports back pain. Patient denies joint pain.  Neurological:   Patient denies headaches and dizziness.  Psychologic:   Patient denies depression and anxiety.   VITAL SIGNS: None   MULTI-SYSTEM PHYSICAL EXAMINATION:  Constitutional: Well-nourished. No physical deformities. Normally developed. Good grooming.  Respiratory: Normal breath sounds. No labored breathing, no use of accessory muscles.   Cardiovascular: Regular rate and rhythm. No murmur, no gallop. Normal temperature, normal extremity pulses,  no swelling, no varicosities.      Complexity of Data:  Source Of History:  Patient  Records Review:   Previous Doctor Records, Previous Patient Records, POC Tool  Urine Test Review:   Urinalysis   04/04/23 10/11/22 04/18/22 11/15/21 10/25/21 07/19/21 07/02/19 06/25/18  PSA  Total PSA 4.59 ng/mL 9.70 ng/mL 10.20 ng/mL 19.40 ng/mL 12.40 ng/mL 14.50 ng/mL 5.91 ng/mL 5.37 ng/mL  Free PSA        1.65 ng/mL  % Free PSA        31 % PSA    11/22/05  Hormones  Testosterone, Total 2.32     PROCEDURES:         Renal Ultrasound - 16109  Right kidney  Length: 10.7 cm Depth: 6.8 cm Cortical Width: 1.7 cm Width: 5.4 cm  Left Kidney Length: 11.0 cm Depth: 6.5 cm Cortical Width: 2.0 cm Width: 4.9 cm   Left Kidney/Ureter:  WNL   Right Kidney/Ureter:  cyst mid pole measures .82cm  Bladder:  PVR= 114.47ml       large prostate that protrudes into bladder vs mass within bladder see previous CT scan report. There is a calc in center of prostate measures .56cm dystrophic calc vs stone . Patient confirmed No Neulasta OnPro Device.           Urinalysis - 81003 Dipstick Dipstick Cont'd  Color: Yellow Bilirubin: Neg  Appearance: Clear Ketones: Neg  Specific Gravity: 1.025 Blood: Neg  pH: 6.0 Protein: Trace  Glucose: Neg Urobilinogen: 0.2    Nitrites: Neg    Leukocyte Esterase: Neg    Notes:      ASSESSMENT:      ICD-10 Details  1 GU:   Ureteral calculus - N20.1    PLAN:            Medications New Meds: Ondansetron Hcl 4 mg tablet 1-2 tablet PO Q 8 H PRN   #15  0 Refill(s)  Tramadol Hcl 50 mg tablet 1-2 tablet PO Q 6 H PRN   #15  0 Refill(s)  Pharmacy Name:  [id number]  Address:  [address]   [place], [place] [id number]  Phone:  [phone]  Fax:  [phone]    Refill Meds: Finasteride 5 mg tablet 1 tablet PO Daily   #90  3 Refill(s)    Stop Meds: Ketorolac Tromethamine 10 mg tablet 1 tab Q8 prn moderate kidney stone pain  Start: 03/14/2023   Discontinue: 04/18/2023  - Reason: The medication cycle was completed.  Tramadol Hcl 50 mg tablet 1-2 tablet PO Q 6 H PRN  Start: 10/07/2022  Discontinue: 04/18/2023  - Reason: The medication cycle was completed.            Document Letter(s):  Created for Patient: Clinical Summary         Notes:   The patient has a history of low risk low-volume prostate cancer with a PSA that seems to be trending down which is good news for him. He does not need any additional treatment for that at the moment.   The patient's bigger issue at the moment is that he has a left distal ureteral stone that has been present now since January. He is intermittently symptomatic, currently is having  some mild flank pain but as of yesterday was having severe pain. He has a very obstructive prostate and had difficulty with ureteroscopy in the past requiring a TURP in order to access the ureter, so there was some hesitancy in proceeding with ureteroscopy. However, the stone is not visible with plain x-ray and as such shockwave lithotripsy is not a good option.   Our plan is to proceed with ureteroscopy with the caveat that he may need a limited TURP to provide access to the ureter. I went through all that with him. Will try to get him scheduled quickly.

## 2023-04-27 NOTE — Anesthesia Preprocedure Evaluation (Addendum)
Anesthesia Evaluation  Patient identified by MRN, date of birth, ID band Patient awake    Reviewed: Allergy & Precautions, NPO status , Patient's Chart, lab work & pertinent test results  Airway Mallampati: II  TM Distance: >3 FB Neck ROM: Full    Dental  (+) Teeth Intact, Dental Advisory Given   Pulmonary asthma , sleep apnea    breath sounds clear to auscultation       Cardiovascular hypertension, Pt. on medications  Rhythm:Regular Rate:Normal     Neuro/Psych  Headaches PSYCHIATRIC DISORDERS  Depression       GI/Hepatic Neg liver ROS,GERD  Medicated,,  Endo/Other  diabetes    Renal/GU Renal disease     Musculoskeletal  (+) Arthritis ,    Abdominal   Peds  Hematology negative hematology ROS (+)   Anesthesia Other Findings   Reproductive/Obstetrics                             Anesthesia Physical Anesthesia Plan  ASA: 2  Anesthesia Plan: General   Post-op Pain Management: Tylenol PO (pre-op)*   Induction: Intravenous  PONV Risk Score and Plan: 3 and Ondansetron, Aprepitant, Dexamethasone and Midazolam  Airway Management Planned: LMA  Additional Equipment: None  Intra-op Plan:   Post-operative Plan: Extubation in OR  Informed Consent: I have reviewed the patients History and Physical, chart, labs and discussed the procedure including the risks, benefits and alternatives for the proposed anesthesia with the patient or authorized representative who has indicated his/her understanding and acceptance.     Dental advisory given  Plan Discussed with: CRNA  Anesthesia Plan Comments:        Anesthesia Quick Evaluation

## 2023-04-27 NOTE — Op Note (Signed)
Preoperative diagnosis:  Left ureteral stone  Postoperative diagnosis:  Same  Procedure: Cystoscopy, left retrograde pyelogram with interpretation Left diagnostic ureteroscopy Left ureteral stent placement  Surgeon: [name], MD  Anesthesia: General  Complications: None  Intraoperative findings:  #1: The patient's stone appeared to be in the prostatic urethra, which was blown back up into the bladder.  Ultimately I was able to find the stone and evacuated.  The patient's cystoscopic evaluation demonstrated a very large median lobe that was partially resected on the left side.  The left ureteral orifice was orthotopic but extremely difficult to find because of the size of the median lobe and the intravesical protrusion.  There was some trabeculation within the bladder as well. #2: The retrograde pyelogram on the patient's left side demonstrated some fullness in the ureter with possible filling defect in the left distal ureter where the previous stone had been hung up.  There were no filling defects within the upper tract. #3: Ureteroscopy demonstrated a scar within the distal ureter at the area where I appreciated a filling defect/ureter abnormality.  However, there were no stones within the ureter or the kidney. #4: A 24 cm time 6 French double-J ureteral stent was placed at the end of the case.  Stent tether was brought through the patient's urethra and secured to the dorsum of his penis.  EBL: Minimal  Specimens: None  Indication: Rickey Cruz is a 62 y.o. patient with obstructing left ureteral stone that failed medical expulsion therapy.  After reviewing the management options for treatment, he elected to proceed with the above surgical procedure(s). We have discussed the potential benefits and risks of the procedure, side effects of the proposed treatment, the likelihood of the patient achieving the goals of the procedure, and any potential problems that might occur during the  procedure or recuperation. Informed consent has been obtained.  Description of procedure:  The patient was taken to the operating room and general anesthesia was induced.  The patient was placed in the dorsal lithotomy position, prepped and draped in the usual sterile fashion, and preoperative antibiotics were administered. A preoperative time-out was performed.   21 French 30 degree cystoscope was gently passed through the patient's urethra into the bladder under visual guidance.  I encountered a calcification within the prostatic urethra that quickly was blown into the patient's bladder.  I subsequently lost the stone once it was in the bladder because I could not get over top of the patient's prostate.  I did perform cystoscopic evaluation which demonstrated a partially resected left median lobe with a very large right side of the median lobe and massive intravesical protrusion.  I was unable to locate the patient's left ureteral orifice with the rigid cystoscope.  As such, I used the flexible cystoscope and was able to cannulate the patient's left ureteral orifice.  However, I was unable to advance it through the cystoscope but because of the J-hook.  I subsequently advanced an open-ended catheter over the wire to help facilitate advancing the sensor wire up into the left renal pelvis.  I then remove the open-ended catheter and removed the cystoscope over the wire.  I then performed flexible cystoscopy to try and locate the patient's stone.  Again, I was unable to find it because the patient is time had developed some prostatic associated bleeding and the visualization was poor.  I emptied the patient's bladder and then advanced a dual-lumen catheter over the patient's safety wire and into the distal ureter.  A retrograde pyelogram was then performed with the above findings.  I then advanced a second wire through the dual-lumen catheter and up into the left renal pelvis removing the catheter over the  wire.  I then advanced a single-lumen flexible digital ureteroscope over the second wire and was able to cannulate the ureteral orifice but unable to get it past the ureteral abnormality.  I opted at this time to remove the scope over the wire and then advance the 11/13 [place] ureteral access sheath.  I advanced the inner portion of the sheath initially which easily advanced and then the inner and outer portion together subsequently.  With some gentle pressure I was able to dilate this area of abnormality.  I then remove the inner portion and second wire and exchanged for the flexible ureteroscope.  Pyeloscopy was then performed with fluoroscopic guidance ensuring that all calyces had been inspected and no stone was encountered.  I then slowly backed out the ureteroscope noting no stones or other abnormalities within the bladder.  Once I got to the area that needed some dilation I noted a short scar that was widely patent from the passive dilation but no additional filling defects or stones.  At this time I backloaded the rigid cystoscope over the wire and advanced the scope back into the patient's bladder.  I then slowly advanced a 24 cm time 6 French double-J left ureteral stent over the wire and into the patient's left renal pelvis.  Once it was noted to be well within the renal pelvis I backed the scope up to the bladder neck before removing the wire entirely.  Stent was noted to curl nicely within the patient's bladder as well as in the renal pelvis.  I pulled the patient's stent tether through the urethra and secured it to the dorsum of his penis.  Bladder stone was not present.  Looking in the drape I did find a small stone that did appear to be what I had seen initially in the urethra.  I sent this for stone analysis.  At this time the patient was subsequently extubated and returned to PACU in stable condition.  Disposition: The patient will be instructed to remove his stent on Monday, May 6.  He will  then follow-up with me in clinic in 6 weeks with a renal ultrasound prior.  [name], M.D.

## 2023-04-27 NOTE — Transfer of Care (Signed)
Immediate Anesthesia Transfer of Care Note  Patient: Rickey Cruz  Procedure(s) Performed: Procedure(s) (LRB): CYSTOSCOPY LEFT URETEROSCOPY, STONE EXTRACTION, LEFT URETERAL STENT PLACEMENT (Left)  Patient Location: PACU  Anesthesia Type: General  Level of Consciousness: awake, sedated, patient cooperative and responds to stimulation  Airway & Oxygen Therapy: Patient Spontanous Breathing and Patient connected to [place] oxygen  Post-op Assessment: Report given to PACU RN, Post -op Vital signs reviewed and stable and Patient moving all extremities  Post vital signs: Reviewed and stable  Complications: No apparent anesthesia complications

## 2023-04-27 NOTE — Discharge Instructions (Addendum)
DISCHARGE INSTRUCTIONS FOR KIDNEY STONE/URETERAL STENT   MEDICATIONS:  1.  Resume all your other meds from home, take pain medications that you already have as instructed. 2. Take Cipro one hour prior to removal of your stent.   ACTIVITY:  1. No strenuous activity x 1week  2. No driving while on narcotic pain medications  3. Drink plenty of water  4. Continue to walk at home - you can still get blood clots when you are at home, so keep active, but don't over do it.  5. May return to work/school tomorrow or when you feel ready   BATHING:  1. You can shower and we recommend daily showers  2. You have a string coming from your urethra: The stent string is attached to your ureteral stent. Do not pull on this.   SIGNS/SYMPTOMS TO CALL:  Please call us if you have a fever greater than 101.5, uncontrolled nausea/vomiting, uncontrolled pain, dizziness, unable to urinate, bloody urine, chest pain, shortness of breath, leg swelling, leg pain, redness around wound, drainage from wound, or any other concerns or questions.   You can reach [place] at [phone].   FOLLOW-UP:  1. You have an appointment in 6 weeks with a ultrasound of your kidneys prior.    2. You have a string attached to your stent, you may remove it on May 01, 2023. To do this, pull the strings until the stents are completely removed. You may feel an odd sensation in your back.          No ibuprofen, Advil, Aleve, Motrin, ketorolac, meloxicam, naproxen, or other NSAIDS until after 3:00 pm today if needed.   Post Anesthesia Home Care Instructions  Activity: Get plenty of rest for the remainder of the day. A responsible individual must stay with you for 24 hours following the procedure.  For the next 24 hours, DO NOT: -Drive a car -Advertising copywriter -Drink alcoholic beverages -Take any medication unless instructed by your physician -Make any legal decisions or sign important papers.  Meals: Start with liquid foods such  as gelatin or soup. Progress to regular foods as tolerated. Avoid greasy, spicy, heavy foods. If nausea and/or vomiting occur, drink only clear liquids until the nausea and/or vomiting subsides. Call your physician if vomiting continues.  Special Instructions/Symptoms: Your throat may feel dry or sore from the anesthesia or the breathing tube placed in your throat during surgery. If this causes discomfort, gargle with warm salt water. The discomfort should disappear within 24 hours.      [facility] [phone] Post Ureteroscopy With or Without Stent Instructions  Definitions:  Ureter: The duct that transports urine from the kidney to the bladder. Stent:   A plastic hollow tube that is placed into the ureter, from the kidney to the bladder to prevent the ureter from swelling shut.  GENERAL INSTRUCTIONS:  Despite the fact that no skin incisions were used, the area around the ureter and bladder is raw and irritated. The stent is a foreign body which will further irritate the bladder wall. This irritation is manifested by increased frequency of urination, both day and night, and by an increase in the urge to urinate. In some, the urge to urinate is present almost always. Sometimes the urge is strong enough that you may not be able to stop yourself from urinating. The only real cure is to remove the stent and then give time for the bladder wall to heal which can't be done until the danger of  the ureter swelling shut has passed, which varies.  You may see some blood in your urine while the stent is in place and a few days afterwards. Do not be alarmed, even if the urine was clear for a while. Get off your feet and drink lots of fluids until clearing occurs. If you start to pass clots or don't improve, call us.  DIET: You may return to your normal diet immediately. Because of the raw surface of your bladder, alcohol, spicy foods, acid type foods and drinks with caffeine may cause  irritation or frequency and should be used in moderation. To keep your urine flowing freely and to avoid constipation, drink plenty of fluids during the day ( 8-10 glasses ). Tip: Avoid cranberry juice because it is very acidic.  ACTIVITY: Your physical activity doesn't need to be restricted. However, if you are very active, you may see some blood in your urine. We suggest that you reduce your activity under these circumstances until the bleeding has stopped.  BOWELS: It is important to keep your bowels regular during the postoperative period. Straining with bowel movements can cause bleeding. A bowel movement every other day is reasonable. Use a mild laxative if needed, such as Milk of Magnesia 2-3 tablespoons, or 2 Dulcolax tablets. Call if you continue to have problems. If you have been taking narcotics for pain, before, during or after your surgery, you may be constipated. Take a laxative if necessary.   MEDICATION: You should resume your pre-surgery medications unless told not to. In addition you will often be given an antibiotic to prevent infection. These should be taken as prescribed until the bottles are finished unless you are having an unusual reaction to one of the drugs.  PROBLEMS YOU SHOULD REPORT TO [place]: Fevers over 100.5 Fahrenheit. Heavy bleeding, or clots ( See above notes about blood in urine ). Inability to urinate. Drug reactions ( hives, rash, nausea, vomiting, diarrhea ). Severe burning or pain with urination that is not improving.  FOLLOW-UP: You will need a follow-up appointment to monitor your progress. Call for this appointment at the number listed above. Usually the first appointment will be about three to fourteen days after your surgery.

## 2023-04-27 NOTE — Anesthesia Postprocedure Evaluation (Signed)
Anesthesia Post Note  Patient: [employer]  Procedure(s) Performed: CYSTOSCOPY LEFT URETEROSCOPY, STONE EXTRACTION, LEFT URETERAL STENT PLACEMENT (Left: Ureter)     Patient location during evaluation: PACU Anesthesia Type: General Level of consciousness: awake and alert Pain management: pain level controlled Vital Signs Assessment: post-procedure vital signs reviewed and stable Respiratory status: spontaneous breathing, nonlabored ventilation, respiratory function stable and patient connected to nasal cannula oxygen Cardiovascular status: blood pressure returned to baseline and stable Postop Assessment: no apparent nausea or vomiting Anesthetic complications: no  No notable events documented.  Last Vitals:  Vitals:   04/27/23 1000 04/27/23 1137  BP: (!) 135/90 (!) 145/97  Pulse: 75 67  Resp: 15 16  Temp:  36.5 C  SpO2: 94% 99%    Last Pain:  Vitals:   04/27/23 1137  TempSrc:   PainSc: 4                  Shelton Silvas

## 2023-04-28 ENCOUNTER — Encounter (HOSPITAL_BASED_OUTPATIENT_CLINIC_OR_DEPARTMENT_OTHER): Payer: Self-pay | Admitting: Urology

## 2023-05-01 ENCOUNTER — Encounter: Payer: Self-pay | Admitting: Emergency Medicine

## 2023-05-01 DIAGNOSIS — Z713 Dietary counseling and surveillance: Secondary | ICD-10-CM

## 2023-05-02 ENCOUNTER — Telehealth: Payer: Self-pay | Admitting: Emergency Medicine

## 2023-05-02 NOTE — Telephone Encounter (Signed)
Referral has been updated with correct dx code

## 2023-05-02 NOTE — Telephone Encounter (Signed)
I spoke with [facility]. They can  not  use Z code for pt's we can only use E or any letter  except with  z. As of right now referral can not be process until code is change. 

## 2023-05-02 NOTE — Telephone Encounter (Signed)
Okay to refer as requested

## 2023-05-04 LAB — CALCULI, WITH PHOTOGRAPH (CLINICAL LAB)
Calcium Oxalate Dihydrate: 60 %
Calcium Oxalate Monohydrate: 40 %
Weight Calculi: 19 mg

## 2023-07-18 ENCOUNTER — Encounter: Payer: Self-pay | Admitting: Dietician

## 2023-07-18 ENCOUNTER — Encounter: Payer: BC Managed Care – PPO | Attending: Emergency Medicine | Admitting: Dietician

## 2023-07-18 VITALS — Ht 66.0 in | Wt 196.0 lb

## 2023-07-18 DIAGNOSIS — R638 Other symptoms and signs concerning food and fluid intake: Secondary | ICD-10-CM | POA: Diagnosis present

## 2023-07-18 NOTE — Progress Notes (Signed)
Medical Nutrition Therapy  Appointment Start time:  [phone]  Appointment End time:  1645  Primary concerns today: pt wants a variety of nutrition education and is concerned about prediabetes, cholesterol, and kidney stones.    Referral diagnosis: concern about food or nutrition  Preferred learning style: no preference indicated Learning readiness: ready   NUTRITION ASSESSMENT   Anthropometrics  Ht: 66 in  Wt: 196 lbs  Clinical Medical Hx: allergies, arthritis, asthma, cancer, depression, GERD, kidney stones, HLD, HTN, prediabetes Medications: reviewed Labs: 09/26/22 A1c 6.2%, cholesterol 219, triglycerides 224, VLDL 44.8 Notable Signs/Symptoms: none reported Food Allergies: none  Lifestyle & Dietary Hx  Pt states he is concerned about prediabetes, kidney stones, and cholesterol, and wants to work on what he can nutritionally to assess these issues.   Pt states he is in early stage of prostate cancer.   Pt states he walks from the parking deck to his office every morning and evening which is a 10 minute walk.   Pt states he sometimes skips breakfast or lunch. He states eating is irregular. 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   Estimated daily fluid intake: 64 oz Supplements: none Sleep: 10-2am, sometimes falls back asleep til 6am.  Stress / self-care: moderate stress Current average weekly physical activity: ADLs, 10 minute walk to and from the parking lot.   24-Hr Dietary Recall First Meal: none OR eggs and 3 eggo waffles Snack: banana Second Meal: wendys daves double sandwich with no cheese and fries and half and sometimes frosty and half sweet tea OR none OR [place] mike sandwich with beef, [place], ham, pickles, cheese, lettuce Snack: none OR crackers  Third Meal: 5pm: spaghetti OR rice and beef/meat Snack: none Beverages: half/half tea, 4 bottles water   NUTRITION DIAGNOSIS  NB-1.1 Food and nutrition-related knowledge deficit  As related to lack of prior nutrition education by a nutrition professional.  As evidenced by pt report.   NUTRITION INTERVENTION  Nutrition education (E-1) on the following topics:  Fruits & Vegetables: Aim to fill half your plate with a variety of fruits and vegetables. They are rich in vitamins, minerals, and fiber, and can help reduce the risk of chronic diseases. Choose a colorful assortment of fruits and vegetables to ensure you get a wide range of nutrients. Grains and Starches: Make at least half of your grain choices whole grains, such as brown rice, whole wheat bread, and oats. Whole grains provide fiber, which aids in digestion and healthy cholesterol levels. Aim for whole forms of starchy vegetables such as potatoes, sweet potatoes, beans, peas, and corn, which are fiber rich and provide many vitamins and minerals.  Protein: Incorporate lean sources of protein, such as poultry, fish, beans, nuts, and seeds, into your meals. Protein is essential for building and repairing tissues, staying full, balancing blood sugar, as well as supporting immune function. Dairy: Include low-fat or fat-free dairy products like milk, yogurt, and cheese in your diet. Dairy foods are excellent sources of calcium and vitamin D, which are crucial for bone health.  Prediabetes: Prediabetes is a condition where blood sugar levels are higher than normal but not yet high enough to be diagnosed as type 2 diabetes. A1C, or hemoglobin A1c, is a blood test that provides an average of a person's blood sugar levels over the past two to three months. It is commonly used to diagnose and monitor diabetes. For prediabetes, an A1C level between 5.7% and 6.4%  typically is used to diagnose this. Here is how the A1C levels are generally categorized: Normal:  A1C below 5.7% Prediabetes:  A1C between 5.7% and 6.4% Diabetes:  A1C of 6.5% or higher When diagnosed with prediabetes, there are several lifestyle changes you can make to  manage the condition: Healthy Eating:  Follow a well-balanced diet that includes a variety of fruits, vegetables, whole grains, lean proteins, and healthy fats. Monitor portion sizes and reduce intake of sugary and processed foods. Regular Physical Activity:  Engage in regular physical activity, such as brisk walking, cycling, or other aerobic exercises, for at least 150 minutes per week. Include strength training exercises at least twice a week.  Handouts Provided Include  Plate Method Snack Ideas  Learning Style & Readiness for Change Teaching method utilized: Visual & Auditory  Demonstrated degree of understanding via: Teach Back  Barriers to learning/adherence to lifestyle change: none  Goals Established by Pt  Goal: Go walking for 10 minutes after dinner at least 3 week days, and go walking for 30 minutes on either Saturday or Sunday.   Goal: aim to increase water intake 5-6 water bottles daily.   Goal: aim to make your plate look like "Plate Method" at least once per day. (1/2 plate non-starchy vegetables, 1/4 plate protein, and 1/4 plate complex carbs.)  Aim to eat within 1-2 hours of waking up and every 3-5 hours following.   When snacking, aim to include a complex carb and protein.   MONITORING & EVALUATION Dietary intake, weekly physical activity, and follow up in 2-3 months.  Next Steps  Patient is to call for questions.

## 2023-07-18 NOTE — Patient Instructions (Addendum)
Goal: Go walking for 10 minutes after dinner at least 3 week days, and go walking for 30 minutes on either Saturday or Sunday.   Goal: aim to increase water intake 5-6 water bottles daily.   Goal: aim to make your plate look like "Plate Method" at least once per day. (1/2 plate non-starchy vegetables, 1/4 plate protein, and 1/4 plate complex carbs.)  Aim to eat within 1-2 hours of waking up and every 3-5 hours following.   When snacking, aim to include a complex carb and protein.

## 2023-07-31 ENCOUNTER — Ambulatory Visit: Payer: BC Managed Care – PPO | Admitting: Podiatry

## 2023-07-31 ENCOUNTER — Ambulatory Visit (INDEPENDENT_AMBULATORY_CARE_PROVIDER_SITE_OTHER): Payer: BC Managed Care – PPO

## 2023-07-31 ENCOUNTER — Encounter: Payer: Self-pay | Admitting: Podiatry

## 2023-07-31 DIAGNOSIS — M79671 Pain in right foot: Secondary | ICD-10-CM | POA: Diagnosis not present

## 2023-07-31 DIAGNOSIS — M109 Gout, unspecified: Secondary | ICD-10-CM | POA: Diagnosis not present

## 2023-07-31 DIAGNOSIS — R601 Generalized edema: Secondary | ICD-10-CM | POA: Diagnosis not present

## 2023-07-31 MED ORDER — METHYLPREDNISOLONE 4 MG PO TBPK
ORAL_TABLET | ORAL | 0 refills | Status: DC
Start: 2023-07-31 — End: 2024-01-10

## 2023-07-31 MED ORDER — COLCHICINE 0.6 MG PO TABS: 0.6000 mg | ORAL_TABLET | Freq: Every day | ORAL | 0 refills | Status: DC

## 2023-07-31 NOTE — Progress Notes (Signed)
Chief Complaint  Patient presents with   Foot Pain    Rm 16 = Right 5th met and arch pain x 3 weeks.  Swelling in feet x 3 weeks. Mild nail lysis right hallux.  Diabetic   HPI: 62 y.o. male presents today with several concerns today.  He is having pain along the outer portion of the right foot as well as the plantar arch.  He is limping secondary to the pain.  States that this has been ongoing for 2 to 3 weeks.  Does not recall any injury.  Never saw any bruising.  Also states that he is having some issues with swelling in the legs and feet x 3 weeks.  This has been present around the same time as the significant pain in his right foot.  He denies any long distance travel or long flights recently.  Patient does note that he is diabetic and would like to have his nails checked.  He notes that the great toenails sometimes feel ingrown.  Denies any redness or drainage.  He wants to know if they look okay.  Past Medical History:  Diagnosis Date   Allergy    Flonase, Patanol, Zyrtec   Arthritis    psoriasis   Asthma    seasonal, with allergies   Borderline diabetes    Cluster headache    Colon polyp 10/27/2011   Depression    denies 11/11/16; denies 11/21/18 "been years"   Eczema    GERD (gastroesophageal reflux disease)    History of kidney stones    x1   Hyperlipidemia    Hypertension    Low back pain    "in kidney area-was told that it was related to gallstones"   Prediabetes    Prostate cancer (HCC)    Psoriasis    [name]   Severe sleep apnea    h/o; had procedure and weight control, does not have to wear CPAP   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 Repeat 5 years.   CYSTOSCOPY WITH RETROGRADE PYELOGRAM, URETEROSCOPY AND STENT PLACEMENT Left 05/22/2022   Procedure: CYSTOSCOPY WITH RETROGRADE PYELOGRAM, URETEROSCOPY AND STENT PLACEMENT; TRANSURETHRAL RESECTION OF PROSTATE;  Surgeon: Jannifer Hick, MD;  Location: [facility];  Service: Urology;  Laterality: Left;   CYSTOSCOPY WITH RETROGRADE PYELOGRAM, URETEROSCOPY AND STENT PLACEMENT Left 06/07/2022   Procedure: CYSTOSCOPY WITH RETROGRADE PYELOGRAM, URETEROSCOPY AND STENT EXCHANGE;  Surgeon: Noel Christmas, MD;  Location: [facility];  Service: Urology;  Laterality: Left;  90 MINS   CYSTOSCOPY/URETEROSCOPY/HOLMIUM LASER/STENT PLACEMENT Left 04/27/2023   Procedure: CYSTOSCOPY LEFT URETEROSCOPY, STONE EXTRACTION, LEFT URETERAL STENT PLACEMENT;  Surgeon: [name], MD;  Location: [facility];  Service: Urology;  Laterality: Left;   EXTRACORPOREAL SHOCK WAVE LITHOTRIPSY Left 05/09/2022   Procedure: EXTRACORPOREAL SHOCK WAVE LITHOTRIPSY (ESWL);  Surgeon: Sebastian Ache, MD;  [facility]  Service: Urology;  Laterality: Left;   HOLMIUM LASER APPLICATION Left 06/07/2022   Procedure: HOLMIUM LASER APPLICATION;  Surgeon: Noel Christmas, MD;  Location: [facility];  Service: Urology;  Laterality: Left;   POLYPECTOMY     PROSTATE BIOPSY  10/2014   will  have another in 3 months   SEPTOPLASTY  2014   TONSILLECTOMY  2014   TRANSURETHRAL RESECTION OF PROSTATE N/A 06/07/2022   Procedure: TRANSURETHRAL RESECTION OF THE PROSTATE (TURP);  Surgeon: Noel Christmas, MD;  Location: [facility];  Service: Urology;  Laterality: N/A;   UVULOPALATOPHARYNGOPLASTY  2014   No Known Allergies   Physical Exam: There were no vitals filed for this visit.  General: The patient is alert and oriented x3 in no acute distress.  Dermatology: Skin is warm, dry and supple bilateral lower extremities. Interspaces are clear of maceration and debris.  No ecchymosis or  erythema noted.  The nails are of normal color with minimal elongation.  The right hallux nail has minimal distal onycholysis but there are no other changes that would be consistent with onychomycosis.  The nail has slight curvature to it but no signs of paronychia are noted today.  Vascular: Palpable pedal pulses bilaterally. Capillary refill within normal limits.  Mild diffuse edema and both ankles and feet but slightly more so on the right midfoot.  Neurological: Light touch sensation grossly intact bilateral feet.   Musculoskeletal Exam: Significant pain on palpation along the fourth and fifth metatarsals as well as the plantar arch central midfoot.  No pain on palpation of the heel is noted.  No pain on palpation of the cuboid area or along the distal peroneals.  Radiographic Exam (right foot, 3 weightbearing views, 07/31/2023):  Normal osseous mineralization.  Joint space narrowing, mild, first metatarsophalangeal joint and talonavicular joint.  No fractures seen.  Assessment/Plan of Care: 1. Right foot pain   2. Acute gout involving toe of right foot, unspecified cause   3. Generalized edema      Meds ordered this encounter  Medications   colchicine 0.6 MG tablet    Sig: Take 1 tablet (0.6 mg total) by mouth daily.    Dispense:  30 tablet    Refill:  0   methylPREDNISolone (MEDROL DOSEPAK) 4 MG TBPK tablet    Sig: Take as directed    Dispense:  21 tablet    Refill:  0   Discussed clinical findings with patient today.  Will send the patient for blood work putting serum uric acid, CBC with differential, and sed rate to evaluate for gout versus some type of inflammatory changes to the foot.  Patient informed he does not have to fast for the blood work.  Will get the patient started on prescription colchicine as well as Medrol Dosepak.  I will follow-up in approximately 2 weeks for recheck.  Informed the patient that his nails are within normal limits.  The minimal distal onycholysis  on the right hallux is normal and most likely from shoe gear.  The hallux corners were cut back slightly to alleviate any discomfort.   Clerance Lav, DPM, FACFAS [facility]     [address] [place], [place] [address]                Office [phone]  Fax [phone]

## 2023-08-01 ENCOUNTER — Encounter: Payer: Self-pay | Admitting: Emergency Medicine

## 2023-08-01 ENCOUNTER — Ambulatory Visit: Payer: BC Managed Care – PPO | Admitting: Emergency Medicine

## 2023-08-01 VITALS — BP 134/88 | HR 73 | Temp 98.4°F | Ht 66.0 in | Wt 197.0 lb

## 2023-08-01 DIAGNOSIS — M109 Gout, unspecified: Secondary | ICD-10-CM | POA: Diagnosis not present

## 2023-08-01 DIAGNOSIS — I10 Essential (primary) hypertension: Secondary | ICD-10-CM | POA: Diagnosis not present

## 2023-08-01 DIAGNOSIS — C61 Malignant neoplasm of prostate: Secondary | ICD-10-CM

## 2023-08-01 DIAGNOSIS — E785 Hyperlipidemia, unspecified: Secondary | ICD-10-CM

## 2023-08-01 DIAGNOSIS — R7303 Prediabetes: Secondary | ICD-10-CM | POA: Diagnosis not present

## 2023-08-01 DIAGNOSIS — Z23 Encounter for immunization: Secondary | ICD-10-CM

## 2023-08-01 NOTE — Assessment & Plan Note (Signed)
Uric acid level done today Diet and nutrition discussed Advised to start colchicine and Medrol Dosepak prescribed yesterday Pain management discussed

## 2023-08-01 NOTE — Assessment & Plan Note (Signed)
Lab Results  Component Value Date   HGBA1C 6.2 09/26/2022  Stable chronic condition.  Diet and nutrition discussed.

## 2023-08-01 NOTE — Patient Instructions (Signed)
Start colchicine and methylprednisolone as instructed by podiatrist yesterday  Gota Gout  La gota es una hinchazn dolorosa de las articulaciones. La gota es un tipo de artritis. Es causada por el exceso de cido rico en el cuerpo. El cido rico es una sustancia qumica que se produce cuando el cuerpo descompone sustancias llamadas purinas. Si el cuerpo tiene un exceso de cido rico, se pueden formar cristales con punta y acumularse en las articulaciones. Esto provoca dolor e hinchazn. Las crisis de gota pueden ocurrir rpidamente y ser muy dolorosas (gota [place]). Con el tiempo, las crisis pueden afectar ms articulaciones y ocurrir con mayor frecuencia (gota crnica). Cules son las causas? La gota es causada por la acumulacin de cido rico en la sangre. Esto puede ocurrir debido a lo siguiente: Los riones no [employer] cantidad suficiente de cido rico de Risk manager. El cuerpo produce demasiado cido rico. Come demasiados alimentos ricos en purinas. Estos alimentos incluyen vsceras, algunos mariscos y Education officer, museum. Los traumatismos o el estrs pueden provocar un ataque. Qu incrementa el riesgo? Tener antecedentes familiares de gota. Ser hombre de [employer]. Ser mujer y haber atravesado la menopausia. Haber tenido un trasplante de rgano. Tomar ciertos medicamentos. Tener ciertas afecciones, tales como: Tener mucho sobrepeso (obesidad). Intoxicacin con plomo. Enfermedad renal. Una afeccin cutnea llamada psoriasis. Otros riesgos incluyen: Perder peso con demasiada rapidez. No tener la cantidad suficiente de agua en el organismo (estar deshidratado). Beber alcohol, en especial, cerveza. Tomar bebidas endulzadas con un tipo de azcar llamado fructosa. Cules son los signos o sntomas? Con frecuencia, un ataque de gota agudo comienza a la noche y suele ocurrir solo en Risk analyst. El lugar ms comn es el pulgar del pie. Tambin pueden verse afectadas las  articulaciones de los pies, los tobillos, las rodillas, los dedos de la mano, las muecas o los codos. Entre los sntomas, se pueden incluir los siguientes: Dolor muy intenso. Calor. Hinchazn. Entumecimiento. Dolor a Insurance claims handler. Se puede sentir mucho dolor al tacto en la articulacin afectada. Piel brillosa, roja o morada. Grant Ruts y escalofros. La gota crnica puede provocar sntomas con ms frecuencia. Pueden verse involucradas ms articulaciones. Es posible que tambin tenga bultos blancos o amarillos (tofos) en las manos o los pies, o en otras zonas cercanas a las articulaciones. Cmo se trata? [place] de un ataque agudo puede incluir medicamentos para Chief Technology Officer y la hinchazn, tales como: Antiinflamatorios no esteroideos (AINE), como el ibuprofeno. Corticoesteroides por boca o inyectados en una articulacin. Colchicina. Este frmaco puede administrarse por boca o a travs de un tubo (catter) intravenoso. El tratamiento para prevenir futuros ataques puede incluir lo siguiente: El uso diario de dosis bajas de antiinflamatorios no esteroideos (AINE) o Corporate treasurer. Usar un medicamento que reduce los niveles de cido rico en la sangre, como el alopurinol. Realizar cambios en la dieta. Es posible que deba consultar a un Paramedic (nutricionista) acerca de lo que debe comer y beber para Cytogeneticist. Siga estas indicaciones en su casa: Durante una crisis de gota  Si se lo indican, aplique hielo sobre la zona dolorida. Para hacer esto: Ponga el hielo en una bolsa plstica. Coloque una toalla entre la piel y Copy. Aplique el hielo durante 20 minutos, 2 a 3 veces por da. Retire el hielo si la piel se le pone de color rojo brillante. Esto es [employer]. Si no puede sentir dolor, calor o fro, tiene un mayor riesgo de que se dae la zona. [place]  la articulacin dolorida por encima del nivel del corazn tan frecuentemente como sea posible. Haga reposo todo  el tiempo que pueda. Si la articulacin est en la pierna, quiz deba usar muletas. Siga las indicaciones del mdico respecto de lo que no puede comer o beber. Cmo evitar las crisis de gota en el futuro Siga una dieta con bajo contenido de purinas. Evite el consumo de alimentos y bebidas como los siguientes: Hgado. Rin. Anchoas. Esprragos. Arenque. Hongos. Mejillones. Cerveza. Mantenga un peso saludable. Si desea adelgazar, hable con el mdico. No baje de peso demasiado rpido. Comience o contine con un plan de ejercicios como se lo haya indicado el mdico. Comida y bebida Evite las bebidas endulzadas con fructosa. Beba suficiente lquido para Radio producer pis (la orina) de color amarillo plido. Si bebe alcohol: Limite la cantidad que bebe a lo siguiente: De 0 a 1 medida por da para las mujeres que no estn embarazadas. De 0 a 2 medidas por da para los hombres. Sepa cunta cantidad de alcohol hay en las bebidas. En los [address], una medida equivale a una botella de cerveza de 12 oz (355 ml), un vaso de vino de 5 oz (148 ml) o un vaso de una bebida alcohlica de alta graduacin de 1 oz (44 ml). Indicaciones generales Use los medicamentos de venta libre y los recetados solamente como se lo haya indicado el mdico. Pregunte al mdico si debe evitar conducir o Chemical engineer mquinas mientras toma los medicamentos. Retome sus actividades normales cuando el mdico le diga que es seguro. Concurra a todas las visitas de seguimiento. Dnde obtener ms informacin [employer] of Health (Institutos Nacionales de la Salud: www.niams.http://www.myers.net/ Comunquese con un mdico si: Tiene otra crisis de gota. Sigue teniendo sntomas de [place] crisis de gota despus de [address] de [place]. Tiene problemas (efectos secundarios) debido a los medicamentos. Tiene escalofros o fiebre. Tiene sensacin de [employer] al [employer]. Siente dolor en la parte inferior de la espalda o en el  abdomen. Solicite ayuda de inmediato si: Nurse, adult. No es posible Human resources officer. No puede hacer pis. Resumen La gota es una hinchazn dolorosa de las articulaciones. El lugar ms habitual donde se presenta el dolor es el dedo gordo del pie, pero tambin pueden verse afectadas otras articulaciones. Los medicamentos y evitar algunos alimentos pueden ayudar a prevenir y tratar las crisis de [place]. Esta informacin no tiene Theme park manager el consejo del mdico. Asegrese de hacerle al mdico cualquier pregunta que tenga. Document Revised: 10/01/2021 Document Reviewed: 10/01/2021 Elsevier Patient Education  2024 [employer].

## 2023-08-01 NOTE — Assessment & Plan Note (Signed)
Stable chronic condition Continues rosuvastatin 10 mg daily

## 2023-08-01 NOTE — Assessment & Plan Note (Signed)
Stable.  Sees oncologist and urologist on a regular basis.

## 2023-08-01 NOTE — Progress Notes (Signed)
[facility] 62 y.o.   Chief Complaint  Patient presents with   gout flare up     Gout flare up right foot about 2-3 weeks. Patient wants lab done.     HISTORY OF PRESENT ILLNESS: This is a 62 y.o. male complaining of gout flareup that started about 2 weeks ago.  Was able to see podiatrist yesterday and was started on colchicine and Medrol Dosepak but has not started taking it yet.  Podiatrist also order blood work.  Will get it done here today.  HPI   Prior to Admission medications   Medication Sig Start Date End Date Taking? Authorizing Provider  albuterol (VENTOLIN HFA) 108 (90 Base) MCG/ACT inhaler Inhale 2 puffs into the lungs every 6 (six) hours as needed for wheezing or shortness of breath.   Yes [provider]  amLODipine (NORVASC) 10 MG tablet Take 1 tablet (10 mg total) by mouth daily. 02/13/23  Yes , Eilleen Kempf, MD  azelastine (ASTELIN) 0.1 % nasal spray Place 2 sprays into both nostrils 2 (two) times daily as needed for rhinitis. Use in each nostril as directed   Yes [provider]  clobetasol ointment (TEMOVATE) 0.05 % Apply 1 application  topically 2 (two) times daily as needed (psoriasis). 05/30/22  Yes [provider]  colchicine 0.6 MG tablet Take 1 tablet (0.6 mg total) by mouth daily. 07/31/23  Yes McCaughan, Dia D, DPM  finasteride (PROSCAR) 5 MG tablet Take 5 mg by mouth daily. 01/15/23  Yes [provider]  fluocinonide cream (LIDEX) 0.05 % APPLY TOPICALLY TO THE AFFECTED AREA TWICE DAILY 04/06/23  Yes , Eilleen Kempf, MD  HYDROcodone-acetaminophen (NORCO) 7.5-325 MG tablet Take 1 tablet by mouth as needed. 12/31/22  Yes [provider]  hydrocortisone (ANUSOL-HC) 2.5 % rectal cream APPLY RECTALLY TO THE AFFECTED AREA TWICE DAILY 12/06/22  Yes , Eilleen Kempf, MD  ibuprofen (ADVIL) 600 MG tablet Take 600 mg by mouth as needed.   Yes [provider]  ketoconazole (NIZORAL) 2 % shampoo Apply 1  Application topically as needed. 01/04/23  Yes [provider]  methylPREDNISolone (MEDROL DOSEPAK) 4 MG TBPK tablet Take as directed 07/31/23  Yes McCaughan, Dia D, DPM  montelukast (SINGULAIR) 10 MG tablet Take 10 mg by mouth at bedtime.   Yes [provider]  Olopatadine HCl (PATADAY OP) Apply 1 drop to eye as needed.   Yes [provider]  pantoprazole (PROTONIX) 40 MG tablet Take 1 tablet (40 mg total) by mouth 2 (two) times daily. 02/28/23  Yes Nandigam, Eleonore Chiquito, MD  rosuvastatin (CRESTOR) 10 MG tablet Take 1 tablet (10 mg total) by mouth daily. 10/26/22  Yes , Eilleen Kempf, MD  tamsulosin (FLOMAX) 0.4 MG CAPS capsule Take 1 capsule (0.4 mg total) by mouth at bedtime. 05/24/22  Yes Noel Christmas, MD    No Known Allergies  Patient Active Problem List   Diagnosis Date Noted   Kidney stone 03/21/2023   Mild persistent asthma without complication 11/03/2022   DM2 (diabetes mellitus, type 2) (HCC) 06/01/2022   Obstructive uropathy 05/21/2022   Moderate persistent asthma with acute exacerbation 04/28/2022   History of prostate cancer 04/25/2022   History of kidney stones 04/25/2022   Malignant neoplasm of prostate (HCC) 04/19/2022   Enlarged prostate 01/12/2022   Seasonal and perennial allergic rhinoconjunctivitis 04/05/2021   Prediabetes 04/02/2019   Dyslipidemia 12/23/2014   Psoriasis 06/27/2014   Multiple lung nodules 12/09/2011   PULMONARY HYPERTENSION 03/04/2010  Essential hypertension 02/10/2010   OBSTRUCTIVE SLEEP APNEA 02/04/2010    Past Medical History:  Diagnosis Date   Allergy    Flonase, Patanol, Zyrtec   Arthritis    psoriasis   Asthma    seasonal, with allergies   Borderline diabetes    Cluster headache    Colon polyp 10/27/2011   Depression    denies 11/11/16; denies 11/21/18 "been years"   Eczema    GERD (gastroesophageal reflux disease)    History of kidney stones    x1   Hyperlipidemia    Hypertension    Low back  pain    "in kidney area-was told that it was related to gallstones"   Prediabetes    Prostate cancer (HCC)    Psoriasis    [name]   Severe sleep apnea    h/o; had procedure and weight control, does not have to wear CPAP   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 Repeat 5 years.   CYSTOSCOPY WITH RETROGRADE PYELOGRAM, URETEROSCOPY AND STENT PLACEMENT Left 05/22/2022   Procedure: CYSTOSCOPY WITH RETROGRADE PYELOGRAM, URETEROSCOPY AND STENT PLACEMENT; TRANSURETHRAL RESECTION OF PROSTATE;  Surgeon: Jannifer Hick, MD;  Location: [facility];  Service: Urology;  Laterality: Left;   CYSTOSCOPY WITH RETROGRADE PYELOGRAM, URETEROSCOPY AND STENT PLACEMENT Left 06/07/2022   Procedure: CYSTOSCOPY WITH RETROGRADE PYELOGRAM, URETEROSCOPY AND STENT EXCHANGE;  Surgeon: Noel Christmas, MD;  Location: [facility];  Service: Urology;  Laterality: Left;  90 MINS   CYSTOSCOPY/URETEROSCOPY/HOLMIUM LASER/STENT PLACEMENT Left 04/27/2023   Procedure: CYSTOSCOPY LEFT URETEROSCOPY, STONE EXTRACTION, LEFT URETERAL STENT PLACEMENT;  Surgeon: [name], MD;  [facility];  Service: Urology;  Laterality: Left;   EXTRACORPOREAL SHOCK WAVE LITHOTRIPSY Left 05/09/2022   Procedure: EXTRACORPOREAL SHOCK WAVE LITHOTRIPSY (ESWL);  Surgeon: Sebastian Ache, MD;  Location: [facility];  Service: Urology;  Laterality: Left;   HOLMIUM LASER APPLICATION Left 06/07/2022   Procedure: HOLMIUM LASER APPLICATION;  Surgeon: Noel Christmas, MD;  [facility];  Service: Urology;  Laterality: Left;   POLYPECTOMY     PROSTATE BIOPSY  10/2014   will have another  in 3 months   SEPTOPLASTY  2014   TONSILLECTOMY  2014   TRANSURETHRAL RESECTION OF PROSTATE N/A 06/07/2022   Procedure: TRANSURETHRAL RESECTION OF THE PROSTATE (TURP);  Surgeon: Noel Christmas, MD;  Location: [facility];  Service: Urology;  Laterality: N/A;   UVULOPALATOPHARYNGOPLASTY  2014    Social History   Socioeconomic History   Marital status: Divorced    Spouse name: Not on file   Number of children: 1   Years of education: Not on file   Highest education level: Doctorate  Occupational History   Occupation: Magazine features editor: [employer]    Comment: Advice worker  Tobacco Use   Smoking status: Never   Smokeless tobacco: Never  Vaping Use   Vaping status: Never Used  Substance and Sexual Activity   Alcohol use: Never    Alcohol/week: 0.0 standard drinks of alcohol   Drug use: Never   Sexual activity: Yes    Comment: SSP  Other Topics Concern  Not on file  Social History Narrative   Original from [place]; moved to [place] 1995.   Marital status: same sexual partner 4 X years.   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        Tobacco: never      Alcohol: none      Drugs: none       Exercise: daily in 2018; 10,000 steps daily; lots of walking on campus at work      ADLs: independent with ADLs.        Sexual activity: dating males only in 2018; bisexual.  Previously married to male.       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         Social Determinants of Health   Financial Resource Strain: Not on file  Food Insecurity: Not  on file  Transportation Needs: Not on file  Physical Activity: Not on file  Stress: Not on file  Social Connections: Not on file  Intimate Partner Violence: Not on file    Family History  Problem Relation Age of Onset   Stroke Mother    Asthma Sister    Colon cancer Neg Hx    Esophageal cancer Neg Hx    Stomach cancer Neg Hx    Prostate cancer Neg Hx    Diabetes Neg Hx    CAD Neg Hx    Migraines Neg Hx    Headache Neg Hx      Review of Systems  Constitutional: Negative.  Negative for chills and fever.  HENT: Negative.  Negative for congestion and sore throat.   Respiratory: Negative.  Negative for cough and shortness of breath.   Cardiovascular: Negative.  Negative for chest pain and palpitations.  Gastrointestinal:  Negative for abdominal pain, diarrhea, nausea and vomiting.  Genitourinary: Negative.  Negative for dysuria and hematuria.  Skin: Negative.  Negative for rash.  Neurological: Negative.  Negative for dizziness and headaches.  All other systems reviewed and are negative.   Vitals:   08/01/23 1358  BP: 134/88  Pulse: 73  Temp: 98.4 F (36.9 C)  SpO2: 98%    Physical Exam Vitals reviewed.  Constitutional:      Appearance: Normal appearance.  HENT:     Head: Normocephalic.     Mouth/Throat:     Mouth: Mucous membranes are moist.     Pharynx: Oropharynx is clear.  Eyes:     Extraocular Movements: Extraocular movements intact.  Cardiovascular:     Rate and Rhythm: Normal rate and regular rhythm.     Pulses: Normal pulses.     Heart sounds: Normal heart sounds.  Pulmonary:     Effort: Pulmonary effort is normal.     Breath sounds: Normal breath sounds.  Abdominal:     Palpations: Abdomen is soft.     Tenderness: There is no abdominal tenderness.  Musculoskeletal:     Cervical back: No tenderness.  Lymphadenopathy:     Cervical: No cervical adenopathy.  Skin:    General: Skin is warm and dry.     Capillary Refill: Capillary refill  takes less  than 2 seconds.  Neurological:     General: No focal deficit present.     Mental Status: He is alert and oriented to person, place, and time.  Psychiatric:        Mood and Affect: Mood normal.        Behavior: Behavior normal.      ASSESSMENT & PLAN: A total of 42 minutes was spent with the patient and counseling/coordination of care regarding preparing for this visit, review of most recent office visit notes, review of most recent oncologist and urologist office visit notes, review of most recent podiatrist office visit notes, review of most recent blood work results, treatment of gout and flareups, education on nutrition, review of chronic medical conditions under management, review of all medications, prognosis, documentation, need for follow-up.  Problem List Items Addressed This Visit       Cardiovascular and Mediastinum   Essential hypertension    BP Readings from Last 3 Encounters:  08/01/23 134/88  04/27/23 (!) 145/97  03/21/23 122/76  Well-controlled hypertension with normal blood pressure readings at home Continue amlodipine 10 mg daily Cardiovascular risk associated with hypertension discussed Dietary approaches to stop hypertension discussed       Relevant Orders   Comprehensive metabolic panel     Musculoskeletal and Integument   Acute gout involving toe of right foot - Primary    Uric acid level done today Diet and nutrition discussed Advised to start colchicine and Medrol Dosepak prescribed yesterday Pain management discussed      Relevant Orders   CBC with Differential/Platelet   Uric acid   Sedimentation rate     Genitourinary   Malignant neoplasm of prostate (HCC)    Stable.  Sees oncologist and urologist on a regular basis.      Relevant Orders   CBC with Differential/Platelet   Comprehensive metabolic panel     Other   Dyslipidemia    Stable chronic condition Continues rosuvastatin 10 mg daily      Prediabetes    Lab Results   Component Value Date   HGBA1C 6.2 09/26/2022  Stable chronic condition.  Diet and nutrition discussed.       Relevant Orders   Hemoglobin A1c   Other Visit Diagnoses     Need for vaccination       Need for shingles vaccine       Relevant Orders   Zoster, Recombinant (Shingrix) (Completed)      Patient Instructions  Start colchicine and methylprednisolone as instructed by podiatrist yesterday  Gota Gout  La gota es una hinchazn dolorosa de las articulaciones. La gota es un tipo de artritis. Es causada por el exceso de cido rico en el cuerpo. El cido rico es una sustancia qumica que se produce cuando el cuerpo descompone sustancias llamadas purinas. Si el cuerpo tiene un exceso de cido rico, se pueden formar cristales con punta y acumularse en las articulaciones. Esto provoca dolor e hinchazn. Las crisis de gota pueden ocurrir rpidamente y ser muy dolorosas (gota [place]). Con el tiempo, las crisis pueden afectar ms articulaciones y ocurrir con mayor frecuencia (gota crnica). Cules son las causas? La gota es causada por la acumulacin de cido rico en la sangre. Esto puede ocurrir debido a lo siguiente: Los riones no [employer] cantidad suficiente de cido rico de Risk manager. El cuerpo produce demasiado cido rico. Come demasiados alimentos ricos en purinas. Estos alimentos incluyen vsceras, algunos mariscos y Education officer, museum. Los traumatismos o  el estrs pueden provocar un ataque. Qu incrementa el riesgo? Tener antecedentes familiares de gota. Ser hombre de [employer]. Ser mujer y haber atravesado la menopausia. Haber tenido un trasplante de rgano. Tomar ciertos medicamentos. Tener ciertas afecciones, tales como: Tener mucho sobrepeso (obesidad). Intoxicacin con plomo. Enfermedad renal. Una afeccin cutnea llamada psoriasis. Otros riesgos incluyen: Perder peso con demasiada rapidez. No tener la cantidad suficiente de agua en el organismo (estar  deshidratado). Beber alcohol, en especial, cerveza. Tomar bebidas endulzadas con un tipo de azcar llamado fructosa. Cules son los signos o sntomas? Con frecuencia, un ataque de gota agudo comienza a la noche y suele ocurrir solo en Risk analyst. El lugar ms comn es el pulgar del pie. Tambin pueden verse afectadas las articulaciones de los pies, los tobillos, las rodillas, los dedos de la mano, las muecas o los codos. Entre los sntomas, se pueden incluir los siguientes: Dolor muy intenso. Calor. Hinchazn. Entumecimiento. Dolor a Insurance claims handler. Se puede sentir mucho dolor al tacto en la articulacin afectada. Piel brillosa, roja o morada. Grant Ruts y escalofros. La gota crnica puede provocar sntomas con ms frecuencia. Pueden verse involucradas ms articulaciones. Es posible que tambin tenga bultos blancos o amarillos (tofos) en las manos o los pies, o en otras zonas cercanas a las articulaciones. Cmo se trata? El [place] de un ataque agudo puede incluir medicamentos para Chief Technology Officer y la hinchazn, tales como: Antiinflamatorios no esteroideos (AINE), como el ibuprofeno. Corticoesteroides por boca o inyectados en una articulacin. Colchicina. Este frmaco puede administrarse por boca o a travs de un tubo (catter) intravenoso. El tratamiento para prevenir futuros ataques puede incluir lo siguiente: El uso diario de dosis bajas de antiinflamatorios no esteroideos (AINE) o Corporate treasurer. Usar un medicamento que reduce los niveles de cido rico en la sangre, como el alopurinol. Realizar cambios en la dieta. Es posible que deba consultar a un Paramedic (nutricionista) acerca de lo que debe comer y beber para Cytogeneticist. Siga estas indicaciones en su casa: Durante una crisis de gota  Si se lo indican, aplique hielo sobre la zona dolorida. Para hacer esto: Ponga el hielo en una bolsa plstica. Coloque una toalla entre la piel y Copy. Aplique el hielo  durante 20 minutos, 2 a 3 veces por da. Retire el hielo si la piel se le pone de color rojo brillante. Esto es Intel. Si no puede sentir dolor, calor o fro, tiene un mayor riesgo de que se dae la zona. Levante la articulacin dolorida por encima del nivel del corazn tan frecuentemente como sea posible. Haga reposo todo el tiempo que pueda. Si la articulacin est en la pierna, quiz deba usar muletas. Siga las indicaciones del mdico respecto de lo que no puede comer o beber. Cmo evitar las crisis de gota en el futuro Siga una dieta con bajo contenido de purinas. Evite el consumo de alimentos y bebidas como los siguientes: Hgado. Rin. Anchoas. Esprragos. Arenque. Hongos. Mejillones. Cerveza. Mantenga un peso saludable. Si desea adelgazar, hable con el mdico. No baje de peso demasiado rpido. Comience o contine con un plan de ejercicios como se lo haya indicado el mdico. Comida y bebida Evite las bebidas endulzadas con fructosa. Beba suficiente lquido para Radio producer pis (la orina) de color amarillo plido. Si bebe alcohol: Limite la cantidad que bebe a lo siguiente: De 0 a 1 medida por da para las mujeres que no estn embarazadas. De 0 a 2 medidas por da para los hombres. Sepa  cunta cantidad de alcohol hay en las bebidas. En los [address], una medida equivale a una botella de cerveza de 12 oz (355 ml), un vaso de vino de 5 oz (148 ml) o un vaso de una bebida alcohlica de alta graduacin de 1 oz (44 ml). Indicaciones generales Use los medicamentos de venta libre y los recetados solamente como se lo haya indicado el mdico. Pregunte al mdico si debe evitar conducir o Chemical engineer mquinas mientras toma los medicamentos. Retome sus actividades normales cuando el mdico le diga que es seguro. Concurra a todas las visitas de seguimiento. Dnde obtener ms informacin [employer] of Health (Institutos Nacionales de la Salud:  www.niams.http://www.myers.net/ Comunquese con un mdico si: Tiene otra crisis de gota. Sigue teniendo sntomas de [place] crisis de gota despus de [address] de [place]. Tiene problemas (efectos secundarios) debido a los medicamentos. Tiene escalofros o fiebre. Tiene sensacin de [employer] al [employer]. Siente dolor en la parte inferior de la espalda o en el abdomen. Solicite ayuda de inmediato si: Nurse, adult. No es posible Human resources officer. No puede hacer pis. Resumen La gota es una hinchazn dolorosa de las articulaciones. El lugar ms habitual donde se presenta el dolor es el dedo gordo del pie, pero tambin pueden verse afectadas otras articulaciones. Los medicamentos y evitar algunos alimentos pueden ayudar a prevenir y tratar las crisis de [place]. Esta informacin no tiene Theme park manager el consejo del mdico. Asegrese de hacerle al mdico cualquier pregunta que tenga. Document Revised: 10/01/2021 Document Reviewed: 10/01/2021 Elsevier Patient Education  2024 Elsevier Inc.     Edwina Barth, MD [facility]

## 2023-08-01 NOTE — Assessment & Plan Note (Signed)
BP Readings from Last 3 Encounters:  08/01/23 134/88  04/27/23 (!) 145/97  03/21/23 122/76  Well-controlled hypertension with normal blood pressure readings at home Continue amlodipine 10 mg daily Cardiovascular risk associated with hypertension discussed Dietary approaches to stop hypertension discussed

## 2023-08-02 ENCOUNTER — Encounter: Payer: Self-pay | Admitting: Emergency Medicine

## 2023-08-14 ENCOUNTER — Ambulatory Visit: Payer: BC Managed Care – PPO | Admitting: Podiatry

## 2023-08-14 DIAGNOSIS — M10071 Idiopathic gout, right ankle and foot: Secondary | ICD-10-CM | POA: Diagnosis not present

## 2023-08-14 DIAGNOSIS — M79671 Pain in right foot: Secondary | ICD-10-CM

## 2023-08-14 NOTE — Progress Notes (Unsigned)
Chief Complaint  Patient presents with   Foot Pain    Follow up Gout right foot-  colchicine helped for 2 days but has still been painful.  Plantar lateral right foot.      HPI: 62 y.o. male presents today for follow-up of gout in the right foot.  He notes that he is still having mild pain, near the right fifth metatarsal base.  He notes the pain is mostly plantar in location.  Denies a new injury.  He finished all of his gout medication.  Past Medical History:  Diagnosis Date   Allergy    Flonase, Patanol, Zyrtec   Arthritis    psoriasis   Asthma    seasonal, with allergies   Borderline diabetes    Cluster headache    Colon polyp 10/27/2011   Depression    denies 11/11/16; denies 11/21/18 "been years"   Eczema    GERD (gastroesophageal reflux disease)    History of kidney stones    x1   Hyperlipidemia    Hypertension    Low back pain    "in kidney area-was told that it was related to gallstones"   Prediabetes    Prostate cancer (HCC)    Psoriasis    [name]   Severe sleep apnea    h/o; had procedure and weight control, does not have to wear CPAP   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 Repeat 5 years.   CYSTOSCOPY WITH RETROGRADE PYELOGRAM, URETEROSCOPY AND STENT PLACEMENT Left 05/22/2022   Procedure: CYSTOSCOPY WITH RETROGRADE PYELOGRAM, URETEROSCOPY AND STENT PLACEMENT; TRANSURETHRAL RESECTION OF PROSTATE;  Surgeon: Jannifer Hick, MD;  Location: [facility];  Service: Urology;  Laterality: Left;   CYSTOSCOPY WITH RETROGRADE PYELOGRAM, URETEROSCOPY AND STENT PLACEMENT Left 06/07/2022   Procedure: CYSTOSCOPY WITH RETROGRADE  PYELOGRAM, URETEROSCOPY AND STENT EXCHANGE;  Surgeon: Noel Christmas, MD;  Location: [facility];  Service: Urology;  Laterality: Left;  90 MINS   CYSTOSCOPY/URETEROSCOPY/HOLMIUM LASER/STENT PLACEMENT Left 04/27/2023   Procedure: CYSTOSCOPY LEFT URETEROSCOPY, STONE EXTRACTION, LEFT URETERAL STENT PLACEMENT;  Surgeon: [name], MD;  [facility];  Service: Urology;  Laterality: Left;   EXTRACORPOREAL SHOCK WAVE LITHOTRIPSY Left 05/09/2022   Procedure: EXTRACORPOREAL SHOCK WAVE LITHOTRIPSY (ESWL);  Surgeon: Sebastian Ache, MD;  Location: [facility];  Service: Urology;  Laterality: Left;   HOLMIUM LASER APPLICATION Left 06/07/2022   Procedure: HOLMIUM LASER APPLICATION;  Surgeon: Noel Christmas, MD;  [facility];  Service: Urology;  Laterality: Left;   POLYPECTOMY     PROSTATE BIOPSY  10/2014   will have another in 3 months   SEPTOPLASTY  2014   TONSILLECTOMY  2014   TRANSURETHRAL RESECTION OF PROSTATE N/A 06/07/2022   Procedure: TRANSURETHRAL RESECTION OF THE PROSTATE (TURP);  Surgeon: Noel Christmas, MD;  Location[facility];  Service: Urology;  Laterality: N/A;   UVULOPALATOPHARYNGOPLASTY  2014    No Known Allergies   Physical Exam: There were no vitals filed for this visit.  General: The patient is alert and oriented x3 in no acute distress.  Dermatology: Skin is warm, dry and supple  bilateral lower extremities. Interspaces are clear of maceration and debris.    Vascular: Palpable pedal pulses bilaterally. Capillary refill within normal limits.  No appreciable edema.  No erythema or calor.  Musculoskeletal Exam: There is pain on palpation of the right fifth metatarsal base.  No surrounding erythema is noted.  Minimal localized edema is present.  Assessment/Plan of Care: 1. Acute idiopathic gout of right foot   2. Right foot pain     Discussed clinical findings with patient today.  With the patient's consent a corticosteroid  injection was administered to the fifth metatarsal base at the peroneal tendon insertion.  This consisted of a mixture of 1% Xylocaine plain, 0.5% Sensorcaine plain and Kenalog 10 for total of 1.5 cc administered.  He tolerated this well.  A Band-Aid was applied.  He can remove this later today.  Will have the patient follow-up as needed   Clerance Lav, DPM, FACFAS [facility]     [address] [place], [place] [address]                Office [phone]  Fax [phone]

## 2023-08-15 ENCOUNTER — Other Ambulatory Visit: Payer: Self-pay | Admitting: Emergency Medicine

## 2023-10-19 ENCOUNTER — Ambulatory Visit: Payer: BC Managed Care – PPO | Admitting: Dietician

## 2023-10-25 ENCOUNTER — Other Ambulatory Visit: Payer: Self-pay | Admitting: Emergency Medicine

## 2023-10-25 DIAGNOSIS — E785 Hyperlipidemia, unspecified: Secondary | ICD-10-CM

## 2023-12-29 ENCOUNTER — Other Ambulatory Visit: Payer: Self-pay | Admitting: Nurse Practitioner

## 2024-01-08 ENCOUNTER — Other Ambulatory Visit: Payer: Self-pay | Admitting: Emergency Medicine

## 2024-01-08 DIAGNOSIS — I10 Essential (primary) hypertension: Secondary | ICD-10-CM

## 2024-01-09 ENCOUNTER — Ambulatory Visit: Payer: Self-pay | Admitting: Emergency Medicine

## 2024-01-09 NOTE — Telephone Encounter (Signed)
 Copied from CRM [id number]. Topic: Clinical - Medication Refill >> Jan 09, 2024  9:20 AM Isabell A wrote: Most Recent Primary Care Visit:  Provider: PURCELL EMIL SCHANZ  Department: [facility]  Visit Type: OFFICE VISIT  Date: 08/01/2023  Medication: lisinopril  (ZESTRIL ) tablet   Has the patient contacted their pharmacy? Yes (Agent: If no, request that the patient contact the pharmacy for the refill. If patient does not wish to contact the pharmacy document the reason why and proceed with request.) (Agent: If yes, when and what did the pharmacy advise?) Patient is out of refills, got denied & needed an appointment.   Is this the correct pharmacy for this prescription? Yes If no, delete pharmacy and type the correct one.  This is the patient's preferred pharmacy:  [id number] - [place], [place] - [address] AT [facility] OF [address] [place] [address] Phone: [phone] Fax: [phone]   Has the prescription been filled recently? Yes  Is the patient out of the medication? Yes  Has the patient been seen for an appointment in the last year OR does the patient have an upcoming appointment? Yes  Can we respond through MyChart? Yes  Agent: Please be advised that Rx refills may take up to 3 business days. We ask that you follow-up with your pharmacy.  Patient is requesting 2-3 pills to last until his next appointment on 1/16.

## 2024-01-09 NOTE — Telephone Encounter (Signed)
 RN Agent attempt to call patient back regarding his request for lisinopril  (Zestril ). RN Agent did advised medication is not on his profile. RN agent  advised he can request from his pharmacy a 3 day supply from [employer]. Advised to call back if any further assistance is needed. RN Agent will send routed to provider. Patient is scheduled in office on 01/11/2024.

## 2024-01-10 ENCOUNTER — Telehealth: Payer: Self-pay

## 2024-01-10 ENCOUNTER — Ambulatory Visit: Payer: 59 | Admitting: Allergy and Immunology

## 2024-01-10 ENCOUNTER — Encounter: Payer: Self-pay | Admitting: Allergy and Immunology

## 2024-01-10 VITALS — BP 122/84 | HR 68 | Resp 16 | Ht 66.0 in | Wt 197.0 lb

## 2024-01-10 DIAGNOSIS — K219 Gastro-esophageal reflux disease without esophagitis: Secondary | ICD-10-CM

## 2024-01-10 DIAGNOSIS — J988 Other specified respiratory disorders: Secondary | ICD-10-CM

## 2024-01-10 DIAGNOSIS — J4521 Mild intermittent asthma with (acute) exacerbation: Secondary | ICD-10-CM | POA: Diagnosis not present

## 2024-01-10 MED ORDER — BREZTRI AEROSPHERE 160-9-4.8 MCG/ACT IN AERO: INHALATION_SPRAY | RESPIRATORY_TRACT | 11 refills | Status: DC

## 2024-01-10 MED ORDER — AZITHROMYCIN 500 MG PO TABS: ORAL_TABLET | ORAL | 0 refills | Status: DC

## 2024-01-10 MED ORDER — OMEPRAZOLE 20 MG PO CPDR: 20.0000 mg | DELAYED_RELEASE_CAPSULE | Freq: Every day | ORAL | 11 refills | Status: DC

## 2024-01-10 MED ORDER — METHYLPREDNISOLONE ACETATE 80 MG/ML IJ SUSP
80.0000 mg | Freq: Once | INTRAMUSCULAR | Status: AC
  Administered 2024-01-10: 80 mg via INTRAMUSCULAR

## 2024-01-10 MED ORDER — FLUTICASONE PROPIONATE 50 MCG/ACT NA SUSP: NASAL | 11 refills | Status: AC

## 2024-01-10 MED ORDER — ALBUTEROL SULFATE HFA 108 (90 BASE) MCG/ACT IN AERS
INHALATION_SPRAY | RESPIRATORY_TRACT | 1 refills | Status: DC
Start: 2024-01-10 — End: 2024-05-02

## 2024-01-10 NOTE — Telephone Encounter (Signed)
 Copied from CRM [id number]. Topic: Clinical - Medical Advice >> Jan 10, 2024  1:01 PM Crist Dominion wrote: Reason for CRM: Patient is requesting a phone call from Dr. Wiley Hanger nurse to discuss immunization records and due dates.

## 2024-01-10 NOTE — Progress Notes (Signed)
[place]   Follow-up Note  Referring Provider: Georgina Cruz, * Primary Provider: Georgina Quint, MD Date of Office Visit: 01/10/2024  Subjective:   Rickey Cruz (DOB: 10-29-1961) is a 63 y.o. male who [facility] in re-evaluation of the following:  HPI: Rickey Cruz presents to this clinic in evaluation of asthma and allergic rhinoconjunctivitis.  I have not seen him in this clinic since 27 Apr 2022.  He goes long stretches of time without any respiratory tract symptoms and without the use of any medications.  In fact, over the course of the past year he has really done very well and has not required the use of any type of medications for his airway.  He never uses a short acting bronchodilator and he has not required a systemic steroid or an antibiotic for any type of airway issue.  Unfortunately, about 7 days ago he developed acute onset of sneezing and head congestion and ear congestion and coughing and green sputum production and this has been a progressive issue.  5 days ago he restarted his "coughing pill", inhaler, nasal spray.  He has coughed so hard that he has vomited.  He has no associated fever or chills or aches or chest pain.  Allergies as of 01/10/2024   No Known Allergies      Medication List    albuterol 108 (90 Base) MCG/ACT inhaler Commonly known as: VENTOLIN HFA Inhale 2 puffs into the lungs every 6 (six) hours as needed for wheezing or shortness of breath.   albuterol 108 (90 Base) MCG/ACT inhaler Commonly known as: VENTOLIN HFA Can inhale two puffs every four to six hours as needed for cough, wheeze, shortness of breath, chest tightness.   amLODipine 10 MG tablet Commonly known as: NORVASC Take 1 tablet (10 mg total) by mouth daily.   augmented betamethasone dipropionate 0.05 % ointment Commonly known as: DIPROLENE-AF Apply topically.   azelastine 0.1  % nasal spray Commonly known as: ASTELIN Place 2 sprays into both nostrils 2 (two) times daily as needed for rhinitis. Use in each nostril as directed   azithromycin 500 MG tablet Commonly known as: ZITHROMAX Take one tablet by mouth once daily for three days. Started by: Zosia Lucchese J Meygan Kyser   Breztri Aerosphere 160-9-4.8 MCG/ACT Aero Generic drug: Budeson-Glycopyrrol-Formoterol Inhale two puffs with spacer twice daily to prevent cough or wheeze.  Rinse, gargle, and spit after use. Started by: Bradley Handyside J Yatziri Wainwright   calcipotriene 0.005 % ointment Commonly known as: DOVONOX Apply topically every morning.   clobetasol ointment 0.05 % Commonly known as: TEMOVATE Apply 1 application  topically 2 (two) times daily as needed (psoriasis).   finasteride 5 MG tablet Commonly known as: PROSCAR Take 5 mg by mouth daily.   fluocinonide cream 0.05 % Commonly known as: LIDEX APPLY TOPICALLY TO THE AFFECTED AREA TWICE DAILY   fluticasone 50 MCG/ACT nasal spray Commonly known as: FLONASE Use two sprays in each nostril once daily as directed. Started by: Kamala Kolton J Zeth Buday   ketoconazole 2 % shampoo Commonly known as: NIZORAL Apply 1 Application topically as needed.   metroNIDAZOLE 0.75 % cream Commonly known as: METROCREAM Apply topically.   montelukast 10 MG tablet Commonly known as: SINGULAIR Take 10 mg by mouth at bedtime.   MUCINEX FAST-MAX DAY/NIGHT PO Take by mouth.   omeprazole 20 MG capsule Commonly known as: PRILOSEC Take 1 capsule (20 mg total)  by mouth daily.   rosuvastatin 10 MG tablet Commonly known as: CRESTOR TAKE 1 TABLET(10 MG) BY MOUTH DAILY    Past Medical History:  Diagnosis Date   Allergy    Flonase, Patanol, Zyrtec   Arthritis    psoriasis   Asthma    seasonal, with allergies   Borderline diabetes    Cluster headache    Colon polyp 10/27/2011   Depression    denies 11/11/16; denies 11/21/18 "been years"   Eczema    GERD (gastroesophageal reflux disease)     History of kidney stones    x1   Hyperlipidemia    Hypertension    Low back pain    "in kidney area-was told that it was related to gallstones"   Prediabetes    Prostate cancer (HCC)    Psoriasis    Rickey Cruz/dermatology   Severe sleep apnea    h/o; had procedure and weight control, does not have to wear CPAP   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 Repeat 5 years.   CYSTOSCOPY WITH RETROGRADE PYELOGRAM, URETEROSCOPY AND STENT PLACEMENT Left 05/22/2022   Procedure: CYSTOSCOPY WITH RETROGRADE PYELOGRAM, URETEROSCOPY AND STENT PLACEMENT; TRANSURETHRAL RESECTION OF PROSTATE;  Surgeon: Jannifer Hick, MD;  Location: [facility];  Service: Urology;  Laterality: Left;   CYSTOSCOPY WITH RETROGRADE PYELOGRAM, URETEROSCOPY AND STENT PLACEMENT Left 06/07/2022   Procedure: CYSTOSCOPY WITH RETROGRADE PYELOGRAM, URETEROSCOPY AND STENT EXCHANGE;  Surgeon: Noel Christmas, MD;  Location: [facility];  Service: Urology;  Laterality: Left;  90 MINS   CYSTOSCOPY/URETEROSCOPY/HOLMIUM LASER/STENT PLACEMENT Left 04/27/2023   Procedure: CYSTOSCOPY LEFT URETEROSCOPY, STONE EXTRACTION, LEFT URETERAL STENT PLACEMENT;  Surgeon: [name], MD;  Location: [facility];  Service: Urology;  Laterality: Left;   EXTRACORPOREAL SHOCK WAVE LITHOTRIPSY Left 05/09/2022   Procedure: EXTRACORPOREAL SHOCK WAVE LITHOTRIPSY (ESWL);  Surgeon: Sebastian Ache, MD;  Location: [facility];  Service: Urology;  Laterality: Left;   HOLMIUM LASER APPLICATION Left 06/07/2022   Procedure: HOLMIUM LASER APPLICATION;  Surgeon: Noel Christmas, MD;  Location: [facility];  Service: Urology;   Laterality: Left;   POLYPECTOMY     PROSTATE BIOPSY  10/2014   will have another in 3 months   SEPTOPLASTY  2014   TONSILLECTOMY  2014   TRANSURETHRAL RESECTION OF PROSTATE N/A 06/07/2022   Procedure: TRANSURETHRAL RESECTION OF THE PROSTATE (TURP);  Surgeon: Noel Christmas, MD;  Location: [facility];  Service: Urology;  Laterality: N/A;   UVULOPALATOPHARYNGOPLASTY  2014    Review of systems negative except as noted in HPI / PMHx or noted below:  Review of Systems  Constitutional: Negative.   HENT: Negative.    Eyes: Negative.   Respiratory: Negative.    Cardiovascular: Negative.   Gastrointestinal: Negative.   Genitourinary: Negative.   Musculoskeletal: Negative.   Skin: Negative.   Neurological: Negative.   Endo/Heme/Allergies: Negative.   Psychiatric/Behavioral: Negative.       Objective:   Vitals:   01/10/24 1024  BP: 122/84  Pulse: 68  Resp: 16  SpO2: 96%   Height: 5\' 6"  (167.6 cm)  Weight: 197 lb (89.4 kg)   Physical Exam  Constitutional:      Appearance: He is not diaphoretic.  HENT:     Head: Normocephalic.     Right Ear: Tympanic membrane, ear canal and external ear normal.     Left Ear: Tympanic membrane, ear canal and external ear normal.     Nose: Nose normal. No mucosal edema or rhinorrhea.     Mouth/Throat:     Pharynx: Uvula midline. No oropharyngeal exudate.  Eyes:     Conjunctiva/sclera: Conjunctivae normal.  Neck:     Thyroid: No thyromegaly.     Trachea: Trachea normal. No tracheal tenderness or tracheal deviation.  Cardiovascular:     Rate and Rhythm: Normal rate and regular rhythm.     Heart sounds: Normal heart sounds, S1 normal and S2 normal. No murmur heard. Pulmonary:     Effort: No respiratory distress.     Breath sounds: Normal breath sounds. No stridor. No wheezing or rales.  Lymphadenopathy:     Head:     Right side of head: No tonsillar adenopathy.     Left side of head: No tonsillar adenopathy.     Cervical: No cervical  adenopathy.  Skin:    Findings: No erythema or rash.     Nails: There is no clubbing.  Neurological:     Mental Status: He is alert.     Diagnostics: none  Assessment and Plan:   1. Asthma, not well controlled, mild intermittent, with acute exacerbation   2. Respiratory tract infection   3. Gastroesophageal reflux disease, unspecified whether esophagitis present      1.  For this episode:  A. Flonase 2 sprays each nostril once a day B. Singulair 10 mg 1 tablet once a day C. Breztri - 2 inhalations 2 times per day w/ spacer (empty lungs) D. Depomedrol 80 IM delivered in clinic today E. Azithromycin 500 - 1 tablet 1 time per day for 3 days F. Omeprazole 20 mg - 1 time per day  2.  If needed:  A. Albuterol HFA 2 inhalations every 4-6 hours  B. Loratadine 10 mg - 1 tablet 1-2 times per day C. Pataday - 1 drop each eye 1 time per day D. Mucinex DM - 2 times per day  3. Contact clinic if not doing well.  4. Return to clinic in 1 year or earlier if problem  Natalia is acutely infected with some type of organism that is giving rise to significant inflammation of his airway and coughing associated with posttussive emesis.  I am going to treat him for respiratory tract infection with a broad-spectrum antibiotic to cover mycoplasma and pertussis and also give him a systemic steroid as well as a collection of other anti-inflammatory agents for his airway and some omeprazole.  Assuming he does well with this plan I will see him back in this clinic in 1 year or earlier if there is a problem.  He is not going to be using any medications on a chronic basis as he really does very well for a prolonged period in time without any problems without any medications.  Laurette Schimke, MD Allergy / Immunology [facility]

## 2024-01-10 NOTE — Telephone Encounter (Signed)
 Added Medication protocol to close out.

## 2024-01-10 NOTE — Patient Instructions (Addendum)
  1.  For this episode:  A. Flonase  2 sprays each nostril once a day B. Singulair  10 mg 1 tablet once a day C. Breztri  - 2 inhalations 2 times per day w/ spacer (empty lungs) D. Depomedrol 80 IM delivered in clinic today E. Azithromycin  500 - 1 tablet 1 time per day for 3 days F. Omeprazole  20 mg - 1 time per day  2.  If needed:  A. Albuterol  HFA 2 inhalations every 4-6 hours  B. Loratadine  10 mg - 1 tablet 1-2 times per day C. Pataday  - 1 drop each eye 1 time per day D. Mucinex DM - 2 times per day  3. Contact clinic if not doing well.  4. Return to clinic in 1 year or earlier if problem

## 2024-01-10 NOTE — Telephone Encounter (Signed)
 Answer Assessment - Initial Assessment Questions 1. DRUG NAME: "What medicine do you need to have refilled?"        RN Agent attempt to call patient back regarding his request for lisinopril  (Zestril ). RN Agent did advised medication is not on his profile. RN agent  advised he can request from his pharmacy a 3 day supply from [employer]. Advised to call back if any further assistance is needed. RN Agent will send routed to provider. Patient is scheduled in office on 01/11/2024.     Bernetta Brilliant  SignedYesterday  Copy  Copied from CRM [id number]. Topic: Clinical - Medication Refill >> Jan 09, 2024  9:20 AM Isabell A wrote: Most Recent Primary Care Visit:  Provider: Elvira Hammersmith  Department: [facility]  Visit Type: OFFICE VISIT  Date: 08/01/2023   Medication: lisinopril  (ZESTRIL ) tablet    Has the patient contacted their pharmacy? Yes (Agent: If no, request that the patient contact the pharmacy for the refill. If patient does not wish to contact the pharmacy document the reason why and proceed with request.) (Agent: If yes, when and what did the pharmacy advise?) Patient is out of refills, got denied & needed an appointment.    Is this the correct pharmacy for this prescription? Yes If no, delete pharmacy and type the correct one.  This is the patient's preferred pharmacy:  [id number] - [place], [place] - [address] AT [facility] OF [address] & [address] [place] [address] Phone: [phone] Fax: [phone]     Has the prescription been filled recently? Yes   Is the patient out of the medication? Yes   Has the patient been seen for an appointment in the last year OR does the patient have an upcoming appointment? Yes   Can we respond through MyChart? Yes   Agent: Please be advised that Rx refills may take up to 3 business days. We ask that you follow-up with your pharmacy.   Patient is requesting 2-3 pills  to last until his next appointment on 1/16.  Protocols used: Medication Refill and Renewal Call-A-AH

## 2024-01-11 ENCOUNTER — Ambulatory Visit: Payer: 59 | Admitting: Nurse Practitioner

## 2024-01-11 ENCOUNTER — Encounter: Payer: Self-pay | Admitting: Allergy and Immunology

## 2024-01-11 NOTE — Telephone Encounter (Signed)
Spoke with patient and made him aware of what immunizations are due. He understood is was recommended to wait to get these until he felt better.

## 2024-02-14 ENCOUNTER — Other Ambulatory Visit: Payer: Self-pay | Admitting: Urology

## 2024-02-14 DIAGNOSIS — C61 Malignant neoplasm of prostate: Secondary | ICD-10-CM

## 2024-02-21 LAB — HM DIABETES EYE EXAM

## 2024-03-05 ENCOUNTER — Other Ambulatory Visit: Payer: Self-pay | Admitting: Emergency Medicine

## 2024-05-02 ENCOUNTER — Encounter: Payer: Self-pay | Admitting: Emergency Medicine

## 2024-05-02 ENCOUNTER — Ambulatory Visit (INDEPENDENT_AMBULATORY_CARE_PROVIDER_SITE_OTHER)

## 2024-05-02 ENCOUNTER — Other Ambulatory Visit: Payer: Self-pay | Admitting: Emergency Medicine

## 2024-05-02 ENCOUNTER — Ambulatory Visit: Admitting: Emergency Medicine

## 2024-05-02 VITALS — BP 118/84 | HR 68 | Temp 98.5°F | Ht 66.0 in | Wt 189.0 lb

## 2024-05-02 DIAGNOSIS — E559 Vitamin D deficiency, unspecified: Secondary | ICD-10-CM

## 2024-05-02 DIAGNOSIS — E785 Hyperlipidemia, unspecified: Secondary | ICD-10-CM | POA: Diagnosis not present

## 2024-05-02 DIAGNOSIS — C61 Malignant neoplasm of prostate: Secondary | ICD-10-CM

## 2024-05-02 DIAGNOSIS — I152 Hypertension secondary to endocrine disorders: Secondary | ICD-10-CM | POA: Diagnosis not present

## 2024-05-02 DIAGNOSIS — E1159 Type 2 diabetes mellitus with other circulatory complications: Secondary | ICD-10-CM

## 2024-05-02 DIAGNOSIS — Z23 Encounter for immunization: Secondary | ICD-10-CM

## 2024-05-02 DIAGNOSIS — R1012 Left upper quadrant pain: Secondary | ICD-10-CM

## 2024-05-02 DIAGNOSIS — E1169 Type 2 diabetes mellitus with other specified complication: Secondary | ICD-10-CM | POA: Diagnosis not present

## 2024-05-02 LAB — CBC WITH DIFFERENTIAL/PLATELET
Basophils Absolute: 0 10*3/uL (ref 0.0–0.1)
Basophils Relative: 0.9 % (ref 0.0–3.0)
Eosinophils Absolute: 0.1 10*3/uL (ref 0.0–0.7)
Eosinophils Relative: 1.5 % (ref 0.0–5.0)
HCT: 47.1 % (ref 39.0–52.0)
Hemoglobin: 16.5 g/dL (ref 13.0–17.0)
Lymphocytes Relative: 20.2 % (ref 12.0–46.0)
Lymphs Abs: 0.8 10*3/uL (ref 0.7–4.0)
MCHC: 35.1 g/dL (ref 30.0–36.0)
MCV: 88.7 fl (ref 78.0–100.0)
Monocytes Absolute: 0.5 10*3/uL (ref 0.1–1.0)
Monocytes Relative: 11.6 % (ref 3.0–12.0)
Neutro Abs: 2.6 10*3/uL (ref 1.4–7.7)
Neutrophils Relative %: 65.8 % (ref 43.0–77.0)
Platelets: 256 10*3/uL (ref 150.0–400.0)
RBC: 5.31 Mil/uL (ref 4.22–5.81)
RDW: 13.3 % (ref 11.5–15.5)
WBC: 4 10*3/uL (ref 4.0–10.5)

## 2024-05-02 LAB — URINALYSIS
Bilirubin Urine: NEGATIVE
Hgb urine dipstick: NEGATIVE
Ketones, ur: NEGATIVE
Leukocytes,Ua: NEGATIVE
Nitrite: NEGATIVE
Specific Gravity, Urine: 1.015 (ref 1.000–1.030)
Total Protein, Urine: NEGATIVE
Urine Glucose: NEGATIVE
Urobilinogen, UA: 0.2 (ref 0.0–1.0)
pH: 7 (ref 5.0–8.0)

## 2024-05-02 LAB — MICROALBUMIN / CREATININE URINE RATIO
Creatinine,U: 144.4 mg/dL
Microalb Creat Ratio: 21.8 mg/g (ref 0.0–30.0)
Microalb, Ur: 3.1 mg/dL — ABNORMAL HIGH (ref 0.0–1.9)

## 2024-05-02 LAB — LIPID PANEL
Cholesterol: 120 mg/dL (ref 0–200)
HDL: 27.9 mg/dL — ABNORMAL LOW (ref >39.00)
LDL Cholesterol: 36 mg/dL (ref 0–99)
NonHDL: 92.2
Total CHOL/HDL Ratio: 4
Triglycerides: 281 mg/dL — ABNORMAL HIGH (ref 0.0–149.0)
VLDL: 56.2 mg/dL — ABNORMAL HIGH (ref 0.0–40.0)

## 2024-05-02 LAB — COMPREHENSIVE METABOLIC PANEL WITH GFR
ALT: 29 U/L (ref 0–53)
AST: 25 U/L (ref 0–37)
Albumin: 4.4 g/dL (ref 3.5–5.2)
Alkaline Phosphatase: 74 U/L (ref 39–117)
BUN: 15 mg/dL (ref 6–23)
CO2: 26 meq/L (ref 19–32)
Calcium: 9.1 mg/dL (ref 8.4–10.5)
Chloride: 103 meq/L (ref 96–112)
Creatinine, Ser: 1.03 mg/dL (ref 0.40–1.50)
GFR: 77.66 mL/min (ref >60.00)
Glucose, Bld: 139 mg/dL — ABNORMAL HIGH (ref 70–99)
Potassium: 3.2 meq/L — ABNORMAL LOW (ref 3.5–5.1)
Sodium: 139 meq/L (ref 135–145)
Total Bilirubin: 0.9 mg/dL (ref 0.2–1.2)
Total Protein: 7.3 g/dL (ref 6.0–8.3)

## 2024-05-02 LAB — POCT GLYCOSYLATED HEMOGLOBIN (HGB A1C): Hemoglobin A1C: 6.4 % — AB (ref 4.0–5.6)

## 2024-05-02 LAB — TSH: TSH: 4.31 u[IU]/mL (ref 0.35–5.50)

## 2024-05-02 LAB — VITAMIN D 25 HYDROXY (VIT D DEFICIENCY, FRACTURES): VITD: 12.97 ng/mL — ABNORMAL LOW (ref 30.00–100.00)

## 2024-05-02 LAB — VITAMIN B12: Vitamin B-12: 222 pg/mL (ref 211–911)

## 2024-05-02 MED ORDER — VITAMIN D (ERGOCALCIFEROL) 1.25 MG (50000 UNIT) PO CAPS: 50000.0000 [IU] | ORAL_CAPSULE | ORAL | 1 refills | Status: AC

## 2024-05-02 NOTE — Patient Instructions (Signed)
 Mantenimiento de Research officer, political party, Male Adoptar un estilo de vida saludable y recibir atencin preventiva son importantes para promover la salud y Counsellor. Consulte al mdico sobre: El esquema adecuado para hacerse pruebas y exmenes peridicos. Cosas que puede hacer por su cuenta para prevenir enfermedades y [place] sano. Qu debo saber sobre la dieta, el peso y el ejercicio? Consuma una dieta saludable  Consuma una dieta que incluya muchas verduras, frutas, productos lcteos con bajo contenido de [place] y Associate Professor. No consuma muchos alimentos ricos en grasas slidas, azcares agregados o sodio. Mantenga un peso saludable El ndice de masa muscular [facility]) es una medida que puede utilizarse para identificar posibles problemas de [place]. Proporciona una estimacin de la grasa corporal basndose en el peso y la altura. Su mdico puede ayudarle a Engineer, site IMC y a Personnel officer o Pharmacologist un peso saludable. Haga ejercicio con regularidad Haga ejercicio con regularidad. Esta es una de las prcticas ms importantes que puede hacer por su salud. La [employer] de los adultos deben seguir estas pautas: Education officer, environmental, al menos, 150 minutos de actividad fsica por semana. El ejercicio debe aumentar la frecuencia cardaca y Media planner transpirar (ejercicio de intensidad moderada). Hacer ejercicios de fortalecimiento por lo [employer] por semana. Agregue esto a su plan de ejercicio de intensidad moderada. Pase menos tiempo sentado. Incluso la actividad fsica ligera puede ser beneficiosa. Controle sus niveles de colesterol y lpidos en la sangre Comience a realizarse anlisis de lpidos y Oncologist en la sangre a los 20 aos y luego reptalos cada 5 aos. Es posible que Insurance underwriter los niveles de colesterol con mayor frecuencia si: Sus niveles de lpidos y colesterol son altos. Es mayor de 40 aos. Presenta un alto riesgo de padecer enfermedades cardacas. Qu debo  saber sobre las pruebas de deteccin del cncer? Muchos tipos de cncer pueden detectarse de manera temprana y, a menudo, pueden prevenirse. Segn su historia clnica y sus antecedentes familiares, es posible que deba realizarse pruebas de deteccin del cncer en diferentes edades. Esto puede incluir pruebas de deteccin de lo siguiente: Building services engineer. Cncer de prstata. Cncer de piel. Cncer de pulmn. Qu debo saber sobre la enfermedad cardaca, la diabetes y la hipertensin arterial? Presin arterial y enfermedad cardaca La hipertensin arterial causa enfermedades cardacas y [place] el riesgo de accidente cerebrovascular. Es ms probable que esto se manifieste en las personas que tienen lecturas de presin arterial alta o tienen sobrepeso. Hable con el mdico sobre sus valores de presin arterial deseados. Hgase controlar la presin arterial: Cada 3 a 5 aos si tiene entre 18 y 71 aos. Todos los aos si es mayor de 40 aos. Si tiene entre 65 y 26 aos y es fumador o Insurance underwriter, pregntele al mdico si debe realizarse una prueba de deteccin de aneurisma artico abdominal (AAA) por nica vez. Diabetes Realcese exmenes de deteccin de la diabetes con regularidad. Este anlisis revisa el nivel de azcar en la sangre en [place]. Hgase las pruebas de deteccin: Cada tres aos despus de los 45 aos de edad si tiene un peso normal y un bajo riesgo de padecer diabetes. Con ms frecuencia y a partir de [place] edad inferior si tiene sobrepeso o un alto riesgo de padecer diabetes. Qu debo saber sobre la prevencin de infecciones? Hepatitis B Si tiene un riesgo ms alto de contraer hepatitis B, debe someterse a un examen de deteccin de este virus. Hable con el mdico para averiguar si tiene riesgo de  contraer la infeccin por hepatitis B. Hepatitis C Se recomienda un anlisis de Benjamin Perez para: Todos los que nacieron entre 1945 y [phone]. Todas las personas que tengan un riesgo de haber  contrado hepatitis C. Enfermedades de transmisin sexual (ETS) Debe realizarse pruebas de deteccin de ITS todos los aos, incluidas la gonorrea y la clamidia, si: Es sexualmente activo y es menor de [address]. Es mayor de [address], y Public affairs consultant informa que corre riesgo de tener este tipo de infecciones. La actividad sexual ha cambiado desde que le hicieron la ltima prueba de deteccin y tiene un riesgo mayor de Warehouse manager clamidia o Copy. Pregntele al mdico si usted tiene riesgo. Pregntele al mdico si usted tiene un alto riesgo de Primary school teacher VIH. El mdico tambin puede recomendarle un medicamento recetado para ayudar a evitar la infeccin por el VIH. Si elige tomar medicamentos para prevenir el VIH, primero debe [employer] de deteccin del VIH. Luego debe hacerse anlisis cada 3 meses mientras est tomando los medicamentos. Siga estas indicaciones en su casa: Consumo de alcohol No beba alcohol si el mdico se lo prohbe. Si bebe alcohol: Limite la cantidad que consume de 0 a 2 bebidas por da. Sepa cunta cantidad de alcohol hay en las bebidas que toma. En los [address], una medida equivale a una botella de cerveza de 12 oz (355 ml), un vaso de vino de 5 oz (148 ml) o un vaso de una bebida alcohlica de alta graduacin de 1 oz (44 ml). Estilo de vida No consuma ningn producto que contenga nicotina o tabaco. Estos productos incluyen cigarrillos, tabaco para Theatre manager y aparatos de vapeo, como los Administrator, Civil Service. Si necesita ayuda para dejar de consumir estos productos, consulte al mdico. No consuma drogas. No comparta agujas. Solicite ayuda a su mdico si necesita apoyo o informacin para abandonar las drogas. Indicaciones generales Realcese los estudios de rutina de [address], dentales y de Wellsite geologist. Mantngase al da con las vacunas. Infrmele a su mdico si: Se siente deprimido con frecuencia. Alguna vez ha sido vctima de [place] o no se siente seguro en su  casa. Resumen Adoptar un estilo de vida saludable y recibir atencin preventiva son importantes para promover la salud y Counsellor. Siga las instrucciones del mdico acerca de una dieta saludable, el ejercicio y la realizacin de pruebas o exmenes para Hotel manager. Siga las instrucciones del mdico con respecto al control del colesterol y la presin arterial. Esta informacin no tiene Theme park manager el consejo del mdico. Asegrese de hacerle al mdico cualquier pregunta que tenga. Document Revised: 05/19/2021 Document Reviewed: 05/19/2021 Elsevier Patient Education  2024 [employer].

## 2024-05-02 NOTE — Progress Notes (Signed)
 LEHMAN HANSER 63 y.o.   Chief Complaint  Patient presents with   Leg Pain    Patient states it started a month ago, Left abdominal pain  he states by his ribs that comes and goes. Pt does have belly swelling, tender to the touch. Patient states he has a history of kidney stones and says it doesn't feel like that is why he is here to be seen. No pain with using the bathroom    HISTORY OF PRESENT ILLNESS: This is a 63 y.o. male With 3 things he wants to discuss: 1.  History of prostate cancer.  Scheduled for radiation treatment in [facility] next month 2.  Mood changes.  Emotional lability.  Requesting labs 3.  Pain to left rib cage area and left upper quadrant on and off for weeks No other associated symptoms.  No other complaints or medical concerns today.  HPI   Prior to Admission medications   Medication Sig Start Date End Date Taking? Authorizing Provider  amLODipine  (NORVASC ) 10 MG tablet TAKE 1 TABLET(10 MG) BY MOUTH DAILY 03/05/24  Yes Jamario Colina, Isidro Margo, MD  finasteride  (PROSCAR ) 5 MG tablet Take 5 mg by mouth daily. 01/15/23  Yes [provider]  fluocinonide  cream (LIDEX ) 0.05 % APPLY TOPICALLY TO THE AFFECTED AREA TWICE DAILY 03/05/24  Yes Bailey Faiella, Isidro Margo, MD  fluticasone  (FLONASE ) 50 MCG/ACT nasal spray Use two sprays in each nostril once daily as directed. 01/10/24  Yes Kozlow, Rema Care, MD  ketoconazole  (NIZORAL ) 2 % shampoo Apply 1 Application topically as needed. 01/04/23  Yes [provider]  montelukast  (SINGULAIR ) 10 MG tablet Take 10 mg by mouth at bedtime.   Yes [provider]  omeprazole  (PRILOSEC) 20 MG capsule Take 1 capsule (20 mg total) by mouth daily. 01/10/24  Yes Kozlow, Rema Care, MD  rosuvastatin  (CRESTOR ) 10 MG tablet TAKE 1 TABLET(10 MG) BY MOUTH DAILY 10/25/23  Yes Galilee Pierron, Isidro Margo, MD  albuterol  (VENTOLIN  HFA) 108 (90 Base) MCG/ACT inhaler Inhale 2 puffs into the lungs every 6 (six) hours as needed for wheezing or shortness of  breath. Patient not taking: Reported on 05/02/2024    [provider]  albuterol  (VENTOLIN  HFA) 108 (90 Base) MCG/ACT inhaler Can inhale two puffs every four to six hours as needed for cough, wheeze, shortness of breath, chest tightness. Patient not taking: Reported on 05/02/2024 01/10/24   Kozlow, Eric J, MD  augmented betamethasone dipropionate (DIPROLENE-AF) 0.05 % ointment Apply topically. Patient not taking: Reported on 05/02/2024 01/08/24   [provider]  azelastine  (ASTELIN ) 0.1 % nasal spray Place 2 sprays into both nostrils 2 (two) times daily as needed for rhinitis. Use in each nostril as directed Patient not taking: Reported on 05/02/2024    [provider]  azithromycin  (ZITHROMAX ) 500 MG tablet Take one tablet by mouth once daily for three days. Patient not taking: Reported on 05/02/2024 01/10/24   Fabienne Holter, MD  Budeson-Glycopyrrol-Formoterol (BREZTRI  AEROSPHERE) 160-9-4.8 MCG/ACT AERO Inhale two puffs with spacer twice daily to prevent cough or wheeze.  Rinse, gargle, and spit after use. Patient not taking: Reported on 05/02/2024 01/10/24   Kozlow, Eric J, MD  calcipotriene (DOVONOX) 0.005 % ointment Apply topically every morning. Patient not taking: Reported on 05/02/2024 01/08/24   [provider]  clobetasol  ointment (TEMOVATE ) 0.05 % Apply 1 application  topically 2 (two) times daily as needed (psoriasis). Patient not taking: Reported on 05/02/2024 05/30/22   [provider]  metroNIDAZOLE (METROCREAM) 0.75 %  cream Apply topically. Patient not taking: Reported on 05/02/2024 01/08/24   [provider]  PE-diphenhydrAMINE -DM-GG-APAP (MUCINEX FAST-MAX DAY/NIGHT PO) Take by mouth. Patient not taking: Reported on 05/02/2024    [provider]    No Known Allergies  Patient Active Problem List   Diagnosis Date Noted   Kidney stone 03/21/2023   Mild persistent asthma without complication 11/03/2022   DM2 (diabetes mellitus, type 2)  (HCC) 06/01/2022   Obstructive uropathy 05/21/2022   Moderate persistent asthma with acute exacerbation 04/28/2022   History of prostate cancer 04/25/2022   History of kidney stones 04/25/2022   Malignant neoplasm of prostate (HCC) 04/19/2022   Enlarged prostate 01/12/2022   Seasonal and perennial allergic rhinoconjunctivitis 04/05/2021   Prediabetes 04/02/2019   Acute gout involving toe of right foot 04/02/2019   Dyslipidemia 12/23/2014   Psoriasis 06/27/2014   Multiple lung nodules 12/09/2011   PULMONARY HYPERTENSION 03/04/2010   Essential hypertension 02/10/2010   OBSTRUCTIVE SLEEP APNEA 02/04/2010    Past Medical History:  Diagnosis Date   Allergy    Flonase , Patanol, Zyrtec    Arthritis    psoriasis   Asthma    seasonal, with allergies   Borderline diabetes    Cluster headache    Colon polyp 10/27/2011   Depression    denies 11/11/16; denies 11/21/18 "been years"   Eczema    GERD (gastroesophageal reflux disease)    History of kidney stones    x1   Hyperlipidemia    Hypertension    Low back pain    "in kidney area-was told that it was related to gallstones"   Prediabetes    Prostate cancer (HCC)    Psoriasis    [name]   Severe sleep apnea    h/o; had procedure and weight control, does not have to wear CPAP   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 Repeat 5 years.   CYSTOSCOPY WITH RETROGRADE PYELOGRAM, URETEROSCOPY AND STENT PLACEMENT Left 05/22/2022   Procedure: CYSTOSCOPY WITH RETROGRADE PYELOGRAM, URETEROSCOPY AND STENT PLACEMENT; TRANSURETHRAL RESECTION OF PROSTATE;  Surgeon: Lahoma Pigg, MD;  Location: [facility];   Service: Urology;  Laterality: Left;   CYSTOSCOPY WITH RETROGRADE PYELOGRAM, URETEROSCOPY AND STENT PLACEMENT Left 06/07/2022   Procedure: CYSTOSCOPY WITH RETROGRADE PYELOGRAM, URETEROSCOPY AND STENT EXCHANGE;  Surgeon: Roxane Copp, MD;  Location: [facility];  Service: Urology;  Laterality: Left;  90 MINS   CYSTOSCOPY/URETEROSCOPY/HOLMIUM LASER/STENT PLACEMENT Left 04/27/2023   Procedure: CYSTOSCOPY LEFT URETEROSCOPY, STONE EXTRACTION, LEFT URETERAL STENT PLACEMENT;  Surgeon: Andrez Banker, MD;  Location: [facility];  Service: Urology;  Laterality: Left;   EXTRACORPOREAL SHOCK WAVE LITHOTRIPSY Left 05/09/2022   Procedure: EXTRACORPOREAL SHOCK WAVE LITHOTRIPSY (ESWL);  Surgeon: Osborn Blaze, MD;  Location: [facility];  Service: Urology;  Laterality: Left;   HOLMIUM LASER APPLICATION Left 06/07/2022   Procedure: HOLMIUM LASER APPLICATION;  Surgeon: Roxane Copp, MD;  Location: [facility];  Service: Urology;  Laterality: Left;   POLYPECTOMY     PROSTATE BIOPSY  10/2014   will have another in 3 months   SEPTOPLASTY  2014   TONSILLECTOMY  2014   TRANSURETHRAL RESECTION  OF PROSTATE N/A 06/07/2022   Procedure: TRANSURETHRAL RESECTION OF THE PROSTATE (TURP);  Surgeon: Roxane Copp, MD;  Location: [facility];  Service: Urology;  Laterality: N/A;   UVULOPALATOPHARYNGOPLASTY  2014    Social History   Socioeconomic History   Marital status: Divorced    Spouse name: Not on file   Number of children: 1   Years of education: Not on file   Highest education level: Doctorate  Occupational History   Occupation: Magazine features editor: [employer]    Comment: Advice worker  Tobacco Use   Smoking status: Never   Smokeless tobacco: Never  Vaping Use   Vaping status: Never Used  Substance and Sexual Activity   Alcohol use: Never    Alcohol/week: 0.0 standard drinks of alcohol   Drug use: Never   Sexual activity: Yes    Comment: SSP  Other Topics  Concern   Not on file  Social History Narrative   Original from [place]; moved to [place]  1995.   Marital status: same sexual partner 4 X years.   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        Tobacco: never      Alcohol: none      Drugs: none       Exercise: daily in 2018; 10,000 steps daily; lots of walking on campus at work      ADLs: independent with ADLs.        Sexual activity: dating males only in 2018; bisexual.  Previously married to male.       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         Social Drivers of Corporate investment banker Strain: Not on file  Food Insecurity: Not on file  Transportation Needs: Not on file  Physical Activity: Not on file  Stress: Not on file  Social Connections: Unknown (08/15/2023)   Received from [facility]   Social Isolation    How often do you see or talk to people that you care about and feel close to?: Not on file  Intimate Partner Violence: Not on file    Family History  Problem Relation Age of Onset   Stroke Mother    Asthma Sister    Colon cancer Neg Hx    Esophageal cancer Neg Hx    Stomach cancer Neg Hx    Prostate cancer Neg Hx    Diabetes Neg Hx    CAD Neg Hx    Migraines Neg Hx    Headache Neg Hx      Review of Systems  Constitutional: Negative.  Negative for chills and fever.  HENT: Negative.  Negative for congestion and sore throat.   Respiratory: Negative.  Negative for cough and shortness of breath.   Cardiovascular:  Negative for chest pain and palpitations.   Gastrointestinal:  Positive for abdominal pain. Negative for diarrhea, nausea and vomiting.  Genitourinary: Negative.  Negative for dysuria and hematuria.  Skin: Negative.  Negative for rash.  Neurological: Negative.  Negative for dizziness and headaches.  All other  systems reviewed and are negative.   Vitals:   05/02/24 0806  BP: 118/84  Pulse: 68  Temp: 98.5 F (36.9 C)  SpO2: 95%    Physical Exam Vitals reviewed.  Constitutional:      Appearance: Normal appearance.  HENT:     Head: Normocephalic.  Eyes:     Extraocular Movements: Extraocular movements intact.     Pupils: Pupils are equal, round, and reactive to light.  Cardiovascular:     Rate and Rhythm: Normal rate and regular rhythm.     Pulses: Normal pulses.     Heart sounds: Normal heart sounds.  Pulmonary:     Effort: Pulmonary effort is normal.     Breath sounds: Normal breath sounds.  Abdominal:     Palpations: Abdomen is soft.     Tenderness: There is abdominal tenderness (Left upper quadrant).  Musculoskeletal:        General: Normal range of motion.     Cervical back: No tenderness.  Lymphadenopathy:     Cervical: No cervical adenopathy.  Skin:    General: Skin is warm and dry.  Neurological:     General: No focal deficit present.     Mental Status: He is alert and oriented to person, place, and time.  Psychiatric:        Mood and Affect: Mood normal.        Behavior: Behavior normal.    Results for orders placed or performed in visit on 05/02/24 (from the past 24 hours)  POCT HgB A1C     Status: Abnormal   Collection Time: 05/02/24  8:40 AM  Result Value Ref Range   Hemoglobin A1C 6.4 (A) 4.0 - 5.6 %   HbA1c POC (<> result, manual entry)     HbA1c, POC (prediabetic range)     HbA1c, POC (controlled diabetic range)    VITAMIN D 25 Hydroxy (Vit-D Deficiency, Fractures)     Status: Abnormal   Collection Time: 05/02/24  9:13 AM  Result Value Ref Range   VITD 12.97 (L) 30.00 - 100.00 ng/mL   Vitamin B12     Status: None   Collection Time: 05/02/24  9:13 AM  Result Value Ref Range   Vitamin B-12 222 211 - 911 pg/mL  TSH     Status: None   Collection Time: 05/02/24  9:13 AM  Result Value Ref Range   TSH 4.31 0.35 - 5.50 uIU/mL  Lipid panel     Status: Abnormal   Collection Time: 05/02/24  9:13 AM  Result Value Ref Range   Cholesterol 120 0 - 200 mg/dL   Triglycerides 161.0 (H) 0.0 - 149.0 mg/dL   HDL 96.04 (L) >54.09 mg/dL   VLDL 81.1 (H) 0.0 - 91.4 mg/dL   LDL Cholesterol 36 0 - 99 mg/dL   Total CHOL/HDL Ratio 4    NonHDL 92.20   Comprehensive metabolic panel with GFR     Status: Abnormal   Collection Time: 05/02/24  9:13 AM  Result Value Ref Range   Sodium 139 135 - 145 mEq/L   Potassium 3.2 (L) 3.5 - 5.1 mEq/L   Chloride 103 96 - 112 mEq/L   CO2 26 19 - 32 mEq/L   Glucose, Bld 139 (H) 70 - 99 mg/dL   BUN 15 6 - 23 mg/dL   Creatinine, Ser 7.82 0.40 - 1.50 mg/dL   Total Bilirubin 0.9 0.2 - 1.2 mg/dL   Alkaline Phosphatase 74 39 - 117 U/L   AST 25  0 - 37 U/L   ALT 29 0 - 53 U/L   Total Protein 7.3 6.0 - 8.3 g/dL   Albumin 4.4 3.5 - 5.2 g/dL   GFR 16.10 >96.04 mL/min   Calcium  9.1 8.4 - 10.5 mg/dL  CBC with Differential/Platelet     Status: None   Collection Time: 05/02/24  9:13 AM  Result Value Ref Range   WBC 4.0 4.0 - 10.5 K/uL   RBC 5.31 4.22 - 5.81 Mil/uL   Hemoglobin 16.5 13.0 - 17.0 g/dL   HCT 54.0 98.1 - 19.1 %   MCV 88.7 78.0 - 100.0 fl   MCHC 35.1 30.0 - 36.0 g/dL   RDW 47.8 29.5 - 62.1 %   Platelets 256.0 150.0 - 400.0 K/uL   Neutrophils Relative % 65.8 43.0 - 77.0 %   Lymphocytes Relative 20.2 12.0 - 46.0 %   Monocytes Relative 11.6 3.0 - 12.0 %   Eosinophils Relative 1.5 0.0 - 5.0 %   Basophils Relative 0.9 0.0 - 3.0 %   Neutro Abs 2.6 1.4 - 7.7 K/uL   Lymphs Abs 0.8 0.7 - 4.0 K/uL   Monocytes Absolute 0.5 0.1 - 1.0 K/uL   Eosinophils Absolute 0.1 0.0 - 0.7 K/uL   Basophils Absolute 0.0 0.0 - 0.1 K/uL  Urinalysis     Status: None    Collection Time: 05/02/24  9:13 AM  Result Value Ref Range   Color, Urine YELLOW Yellow;Lt. Yellow;Straw;Dark Yellow;Amber;Green;Red;Brown   APPearance CLEAR Clear;Turbid;Slightly Cloudy;Cloudy   Specific Gravity, Urine 1.015 1.000 - 1.030   pH 7.0 5.0 - 8.0   Total Protein, Urine NEGATIVE Negative   Urine Glucose NEGATIVE Negative   Ketones, ur NEGATIVE Negative   Bilirubin Urine NEGATIVE Negative   Hgb urine dipstick NEGATIVE Negative   Urobilinogen, UA 0.2 0.0 - 1.0   Leukocytes,Ua NEGATIVE Negative   Nitrite NEGATIVE Negative  Microalbumin / creatinine urine ratio     Status: Abnormal   Collection Time: 05/02/24  9:13 AM  Result Value Ref Range   Microalb, Ur 3.1 (H) 0.0 - 1.9 mg/dL   Creatinine,U 308.6 mg/dL   Microalb Creat Ratio 21.8 0.0 - 30.0 mg/g     ASSESSMENT & PLAN: A total of 46 minutes was spent with the patient and counseling/coordination of care regarding preparing for this visit, review of most recent office visit notes, review of multiple chronic medical conditions and their management, review of all medications, review of most recent bloodwork results, review of health maintenance items, education on nutrition, prognosis, documentation, and need for follow up.   Problem List Items Addressed This Visit       Cardiovascular and Mediastinum   Hypertension associated with diabetes (HCC) - Primary   BP Readings from Last 3 Encounters:  05/02/24 118/84  01/10/24 122/84  08/01/23 134/88  Well-controlled hypertension with normal blood pressure readings at home Continue amlodipine  10 mg daily Cardiovascular risk associated with hypertension discussed Dietary approaches to stop hypertension discussed Well-controlled diabetes with hemoglobin A1c of 6.4 Diet and nutrition discussed      Relevant Orders   POCT HgB A1C (Completed)   Microalbumin / creatinine urine ratio (Completed)   Urinalysis (Completed)   CBC with Differential/Platelet (Completed)    Comprehensive metabolic panel with GFR (Completed)   Lipid panel (Completed)   TSH (Completed)   HIV Antibody (routine testing w rflx)   Hepatitis C antibody   Vitamin B12 (Completed)   VITAMIN D 25 Hydroxy (Vit-D Deficiency, Fractures) (Completed)  Endocrine   Dyslipidemia associated with type 2 diabetes mellitus (HCC)   Lab Results  Component Value Date   HGBA1C 6.4 (A) 05/02/2024  Chronic stable conditions Diet and nutrition discussed Continue rosuvastatin  10 mg daily       Relevant Orders   POCT HgB A1C (Completed)   Microalbumin / creatinine urine ratio (Completed)   Urinalysis (Completed)   CBC with Differential/Platelet (Completed)   Comprehensive metabolic panel with GFR (Completed)   Lipid panel (Completed)   TSH (Completed)   HIV Antibody (routine testing w rflx)   Hepatitis C antibody   Vitamin B12 (Completed)   VITAMIN D 25 Hydroxy (Vit-D Deficiency, Fractures) (Completed)     Genitourinary   Malignant neoplasm of prostate (HCC)   Stable. Sees oncologist and urologist on a regular basis.  Opted for radiation treatment at [facility] next month        Other   Left upper quadrant abdominal pain   Tender on physical examination. No associated symptoms Clinically stable.  No red flag signs or symptoms Recommend abdominal ultrasound Blood work today      Relevant Orders   US  Abdomen Complete   DG Chest 2 View (Completed)   Other Visit Diagnoses       Need for vaccination       Relevant Orders   Pneumococcal conjugate vaccine 20-valent (Prevnar 20) (Completed)      Patient Instructions  Mantenimiento de Radiographer, therapeutic en los hombres Health Maintenance, Male Adoptar un estilo de vida saludable y recibir atencin preventiva son importantes para promover la salud y Counsellor. Consulte al mdico sobre: El esquema adecuado para hacerse pruebas y exmenes peridicos. Cosas que puede hacer por su cuenta para prevenir enfermedades y [place] sano. Qu debo  saber sobre la dieta, el peso y el ejercicio? Consuma una dieta saludable  Consuma una dieta que incluya muchas verduras, frutas, productos lcteos con bajo contenido de grasa y protenas magras. No consuma muchos alimentos ricos en grasas slidas, azcares agregados o sodio. Mantenga un peso saludable El ndice de masa muscular [facility]) es una medida que puede utilizarse para identificar posibles problemas de [place]. Proporciona una estimacin de la grasa corporal basndose en el peso y la altura. Su mdico puede ayudarle a determinar su IMC y a Personnel officer o Pharmacologist un peso saludable. Haga ejercicio con regularidad Haga ejercicio con regularidad. Esta es una de las prcticas ms importantes que puede hacer por su salud. La [employer] de los adultos deben seguir estas pautas: Education officer, environmental, al menos, 150 minutos de actividad fsica por semana. El ejercicio debe aumentar la frecuencia cardaca y Media planner transpirar (ejercicio de intensidad moderada). Hacer ejercicios de fortalecimiento por lo [employer] por semana. Agregue esto a su plan de ejercicio de intensidad moderada. Pase menos tiempo sentado. Incluso la actividad fsica ligera puede ser beneficiosa. Controle sus niveles de colesterol y lpidos en la sangre Comience a realizarse anlisis de lpidos y Oncologist en la sangre a los 20 aos y luego reptalos cada 5 aos. Es posible que Insurance underwriter los niveles de colesterol con mayor frecuencia si: Sus niveles de lpidos y colesterol son altos. Es mayor de 40 aos. Presenta un alto riesgo de padecer enfermedades cardacas. Qu debo saber sobre las pruebas de deteccin del cncer? Muchos tipos de cncer pueden detectarse de manera temprana y, a menudo, pueden prevenirse. Segn su historia clnica y sus antecedentes familiares, es posible que deba realizarse pruebas de deteccin del cncer en diferentes edades. Esto puede incluir pruebas  de deteccin de lo siguiente: Cncer colorrectal. Cncer de  prstata. Cncer de piel. Cncer de pulmn. Qu debo saber sobre la enfermedad cardaca, la diabetes y la hipertensin arterial? Presin arterial y enfermedad cardaca La hipertensin arterial causa enfermedades cardacas y [place] el riesgo de accidente cerebrovascular. Es ms probable que esto se manifieste en las personas que tienen lecturas de presin arterial alta o tienen sobrepeso. Hable con el mdico sobre sus valores de presin arterial deseados. Hgase controlar la presin arterial: Cada 3 a 5 aos si tiene entre 18 y 34 aos. Todos los aos si es mayor de 40 aos. Si tiene entre 65 y 22 aos y es fumador o Insurance underwriter, pregntele al mdico si debe realizarse una prueba de deteccin de aneurisma artico abdominal (AAA) por nica vez. Diabetes Realcese exmenes de deteccin de la diabetes con regularidad. Este anlisis revisa el nivel de azcar en la sangre en [place]. Hgase las pruebas de deteccin: Cada tres aos despus de los 45 aos de edad si tiene un peso normal y un bajo riesgo de padecer diabetes. Con ms frecuencia y a partir de [place] edad inferior si tiene sobrepeso o un alto riesgo de padecer diabetes. Qu debo saber sobre la prevencin de infecciones? Hepatitis B Si tiene un riesgo ms alto de contraer hepatitis B, debe someterse a un examen de deteccin de este virus. Hable con el mdico para averiguar si tiene riesgo de contraer la infeccin por hepatitis B. Hepatitis C Se recomienda un anlisis de [place] para: Todos los que nacieron entre 1945 y [phone]. Todas las personas que tengan un riesgo de haber contrado hepatitis C. Enfermedades de transmisin sexual (ETS) Debe realizarse pruebas de deteccin de ITS todos los aos, incluidas la gonorrea y la clamidia, si: Es sexualmente activo y es menor de [address]. Es mayor de [address], y Public affairs consultant informa que corre riesgo de tener este tipo de infecciones. La actividad sexual ha cambiado desde que le hicieron la ltima  prueba de deteccin y tiene un riesgo mayor de tener clamidia o Copy. Pregntele al mdico si usted tiene riesgo. Pregntele al mdico si usted tiene un alto riesgo de Primary school teacher VIH. El mdico tambin puede recomendarle un medicamento recetado para ayudar a evitar la infeccin por el VIH. Si elige tomar medicamentos para prevenir el VIH, primero debe [employer] de deteccin del VIH. Luego debe hacerse anlisis cada 3 meses mientras est tomando los medicamentos. Siga estas indicaciones en su casa: Consumo de alcohol No beba alcohol si el mdico se lo prohbe. Si bebe alcohol: Limite la cantidad que consume de 0 a 2 bebidas por da. Sepa cunta cantidad de alcohol hay en las bebidas que toma. En los [address], una medida equivale a una botella de cerveza de 12 oz (355 ml), un vaso de vino de 5 oz (148 ml) o un vaso de una bebida alcohlica de alta graduacin de 1 oz (44 ml). Estilo de vida No consuma ningn producto que contenga nicotina o tabaco. Estos productos incluyen cigarrillos, tabaco para Theatre manager y aparatos de vapeo, como los cigarrillos electrnicos. Si necesita ayuda para dejar de consumir estos productos, consulte al mdico. No consuma drogas. No comparta agujas. Solicite ayuda a su mdico si necesita apoyo o informacin para abandonar las drogas. Indicaciones generales Realcese los estudios de rutina de [address], dentales y de Wellsite geologist. Mantngase al da con las vacunas. Infrmele a su mdico si: Se siente deprimido con frecuencia. Alguna vez ha sido vctima  de maltrato o no se siente seguro en su casa. Resumen Adoptar un estilo de vida saludable y recibir atencin preventiva son importantes para promover la salud y Counsellor. Siga las instrucciones del mdico acerca de una dieta saludable, el ejercicio y la realizacin de pruebas o exmenes para Hotel manager. Siga las instrucciones del mdico con respecto al control del colesterol y la presin  arterial. Esta informacin no tiene Theme park manager el consejo del mdico. Asegrese de hacerle al mdico cualquier pregunta que tenga. Document Revised: 05/19/2021 Document Reviewed: 05/19/2021 Elsevier Patient Education  2024 Elsevier Inc.     Maryagnes Small, MD  [facility]

## 2024-05-02 NOTE — Assessment & Plan Note (Signed)
 Stable. Sees oncologist and urologist on a regular basis.  Opted for radiation treatment at [facility] next month

## 2024-05-02 NOTE — Assessment & Plan Note (Signed)
 Tender on physical examination. No associated symptoms Clinically stable.  No red flag signs or symptoms Recommend abdominal ultrasound Blood work today

## 2024-05-02 NOTE — Assessment & Plan Note (Signed)
 BP Readings from Last 3 Encounters:  05/02/24 118/84  01/10/24 122/84  08/01/23 134/88  Well-controlled hypertension with normal blood pressure readings at home Continue amlodipine  10 mg daily Cardiovascular risk associated with hypertension discussed Dietary approaches to stop hypertension discussed Well-controlled diabetes with hemoglobin A1c of 6.4 Diet and nutrition discussed

## 2024-05-02 NOTE — Assessment & Plan Note (Signed)
 Lab Results  Component Value Date   HGBA1C 6.4 (A) 05/02/2024  Chronic stable conditions Diet and nutrition discussed Continue rosuvastatin  10 mg daily

## 2024-05-03 LAB — HIV ANTIBODY (ROUTINE TESTING W REFLEX): HIV 1&2 Ab, 4th Generation: NONREACTIVE

## 2024-05-03 LAB — HEPATITIS C ANTIBODY: Hepatitis C Ab: NONREACTIVE

## 2024-05-14 ENCOUNTER — Encounter: Payer: Self-pay | Admitting: Emergency Medicine

## 2024-05-17 ENCOUNTER — Other Ambulatory Visit

## 2024-05-28 ENCOUNTER — Ambulatory Visit: Payer: Self-pay

## 2024-05-28 NOTE — Telephone Encounter (Signed)
 Chief Complaint: COVID Symptoms: cough, nasal congestion, H/A, body aches Frequency: 2 days Pertinent Negatives: Patient denies SOB, vomiting, CP Disposition: [] ED /[] Urgent Care (no appt availability in office) / [x] Appointment(In office/virtual)/ []  [facility]/ [] Home Care/ [] Refused Recommended Disposition /[] [facility]/ []  Follow-up with PCP Additional Notes: Pt reports recently flying back from [place]. Yesterday pt began feeling unwell. Pt reports sneezing, nasal congestion, cough with clear mucus, body aches, and a headache. Pt tested positive for COVID today at a pharmacy. Pt states he flies back to [place] Monday for prostate cancer treatment. Pt denies vomiting, CP, SOB. Pt feels warm but took his temp on the phone and it's 98.17F. RN scheduled pt for tomorrow, gave home care advice, and advised ED for worsening. Pt verbalized understanding.    Copied from CRM [id number]. Topic: Clinical - Medical Advice >> May 28, 2024  3:38 PM Caliyah H wrote: Reason for CRM: Patient called stating he recently returned from [place] and began feeling unwell yesterday and today. He went to his pharmacy where a COVID test was given and returned positive. Patient reports no fever, but does have coughing, a runny nose, and mild body discomfort. He is requesting for Dr. Sagardia to prescribe medication. Reason for Disposition  [1] HIGH RISK patient (e.g., weak immune system, age > 64 years, obesity with BMI 30 or higher, pregnant, chronic lung disease or other chronic medical condition) AND [2] COVID symptoms (e.g., cough, fever)  (Exceptions: Already seen by PCP and no new or worsening symptoms.)  Answer Assessment - Initial Assessment Questions 1. COVID-19 DIAGNOSIS: "How do you know that you have COVID?" (e.g., positive lab test or self-test, diagnosed by doctor or NP/PA, symptoms after exposure).     Tested today, at the pharmacy 2. COVID-19 EXPOSURE: "Was there any known exposure to COVID before  the symptoms began?" CDC Definition of close contact: within 6 feet (2 meters) for a total of 15 minutes or more over a 24-hour period.      No, but did fly on a plane 3. ONSET: "When did the COVID-19 symptoms start?"      Yesterday  4. WORST SYMPTOM: "What is your worst symptom?" (e.g., cough, fever, shortness of breath, muscle aches)     "I'm concerned I am getting warm, I have a headache in my frontal lobe" 5. COUGH: "Do you have a cough?" If Yes, ask: "How bad is the cough?"       Yes, now and then 6. FEVER: "Do you have a fever?" If Yes, ask: "What is your temperature, how was it measured, and when did it start?"     "Feels warm" 7. RESPIRATORY STATUS: "Describe your breathing?" (e.g., normal; shortness of breath, wheezing, unable to speak)      No difficulty breathing 8. BETTER-SAME-WORSE: "Are you getting better, staying the same or getting worse compared to yesterday?"  If getting worse, ask, "In what way?"     worse 9. OTHER SYMPTOMS: "Do you have any other symptoms?"  (e.g., chills, fatigue, headache, loss of smell or taste, muscle pain, sore throat)     Cough, runny nose, body aches, "feels warm" (98.17F), clear sputum, H/A  10. HIGH RISK DISEASE: "Do you have any chronic medical problems?" (e.g., asthma, heart or lung disease, weak immune system, obesity, etc.)       Being treated for prostate cancer, returned from [place] yesterday 11. VACCINE: "Have you had the COVID-19 vaccine?" If Yes, ask: "Which one, how many shots, when did  you get it?"       Has had two 13. O2 SATURATION MONITOR:  "Do you use an oxygen saturation monitor (pulse oximeter) at home?" If Yes, ask "What is your reading (oxygen level) today?" "What is your usual oxygen saturation reading?" (e.g., 95%)       N/a  Protocols used: Coronavirus (COVID-19) Diagnosed or Suspected-A-AH

## 2024-05-29 ENCOUNTER — Ambulatory Visit (INDEPENDENT_AMBULATORY_CARE_PROVIDER_SITE_OTHER)

## 2024-05-29 ENCOUNTER — Ambulatory Visit: Admitting: Emergency Medicine

## 2024-05-29 ENCOUNTER — Ambulatory Visit: Payer: Self-pay | Admitting: Emergency Medicine

## 2024-05-29 ENCOUNTER — Encounter: Payer: Self-pay | Admitting: Emergency Medicine

## 2024-05-29 VITALS — BP 104/78 | HR 77 | Temp 99.3°F | Ht 66.0 in | Wt 187.0 lb

## 2024-05-29 DIAGNOSIS — R6889 Other general symptoms and signs: Secondary | ICD-10-CM | POA: Insufficient documentation

## 2024-05-29 DIAGNOSIS — U071 COVID-19: Secondary | ICD-10-CM | POA: Diagnosis not present

## 2024-05-29 LAB — POC COVID19 BINAXNOW: SARS Coronavirus 2 Ag: POSITIVE — AB

## 2024-05-29 MED ORDER — HYDROCODONE BIT-HOMATROP MBR 5-1.5 MG/5ML PO SOLN: 5.0000 mL | Freq: Every evening | ORAL | 0 refills | Status: AC | PRN

## 2024-05-29 MED ORDER — NIRMATRELVIR/RITONAVIR (PAXLOVID)TABLET: 3.0000 | ORAL_TABLET | Freq: Two times a day (BID) | ORAL | 0 refills | Status: AC

## 2024-05-29 NOTE — Patient Instructions (Signed)
 COVID-19: lo que debe saber COVID-19: What to Know El COVID-19 es una infeccin causada por un virus que se denomina SARS-CoV-2. Este tipo de virus se llama coronavirus. Las personas con COVID-19 pueden: Tener pocos sntomas o ninguno. Tener sntomas leves o moderados que afectan los pulmones y la respiracin. Enfermarse mucho. Cules son las causas?  El COVID-19 es causado por un virus. Este virus puede encontrarse en el aire en forma de gotitas o en superficies. Puede transmitirse de [place] persona infectada al toser, estornudar, hablar, cantar o respirar. Una persona puede infectarse si: Inhala las gotitas infectadas que estn en el aire. Toca un objeto que tiene el virus. Qu incrementa el riesgo? Tiene riesgo de contraer COVID-19 si ha estado cerca de alguien con la infeccin. Es ms probable que se enferme gravemente si: Tiene 65 aos o ms. Tiene ciertas enfermedades crnicas, como: Enfermedad cardaca. Diabetes. Enfermedad respiratoria prolongada. Cncer. Embarazo. Est inmunodeprimido. Esto significa que el cuerpo no puede combatir las infecciones con facilidad. Tiene una discapacidad que le dificulta la movilidad, le cuesta moverse o no puede moverse en absoluto. Cules son los signos o sntomas? Los sntomas del COVID-19 pueden ser diferentes en cada persona. Los sntomas tambin pueden variar de leves a muy graves. Generalmente aparecen entre 5 y 6 das despus de la infeccin. Pero pueden tardar [employer] [address] en aparecer. Los sntomas ms frecuentes son: Tos. Cansancio. Nueva prdida del sentido del gusto o del olfato. Izella Marshal. Los sntomas menos frecuentes son: Dolor de Advertising copywriter. Dolor de cabeza. Dolores musculares o corporales. Diarrea. Sarpullido o dedos de las manos o los pies de color diferente al habitual. Los ojos rojos o irritados. A veces, el COVID-19 no causa sntomas. Cmo se diagnostica? El COVID-19 puede diagnosticarse con pruebas realizadas en el  laboratorio o en el hogar. Se usa  lquido de la nariz, la boca o los pulmones para Engineer, manufacturing si el virus est presente. Cmo se trata? El tratamiento del COVID-19 depende de la gravedad de su enfermedad. Los sntomas leves pueden tratarse en el hogar con reposo, lquidos y medicamentos de [address]. Los sntomas muy graves pueden tratarse en la unidad de cuidados intensivos ([place]) del hospital. Si tiene sntomas y tiene riesgo de enfermarse gravemente, es posible que le administren un medicamento para combatir los virus. Este medicamento se denomina antiviral. Cmo se previene? Para protegerse del COVID-19: Conozca los factores de [place]. [place]. Si su cuerpo no puede combatir las infecciones con facilidad, hable con el mdico sobre el tratamiento para ayudar a Research scientist (medical) COVID-19. Mantenga una distancia de al menos 3 pies (1 metro) de Economist. Utilice una mascarilla que se ajuste bien cuando: No pueda mantener la distancia de [employer]. Se encuentre en un lugar con poca circulacin de aire. Trate de estar en espacios abiertos con una buena circulacin de aire cuando est en pblico. Lvese las manos a menudo o use desinfectante de manos a base de alcohol. Cuando tosa o estornude, cbrase la nariz y la boca. Si cree que tiene COVID-19 o estuvo cerca de una persona que lo tiene, qudese en su casa y Scientist, product/process development de otras personas segn le indique el mdico o las autoridades sanitarias. Dnde obtener ms informacin Para obtener ms informacin: Visite [email or web address] Pharmacologist en "Health Topics" (Temas de salud). Escriba "COVID-19" en el cuadro de bsqueda. Visite [email or web address] Pharmacologist en "Health Topics" (Temas de salud). Luego, haga clic en "All Topics" [facility] temas). Escriba "COVID-19" en el cuadro de bsqueda. Solicite ayuda de inmediato  si: Tiene dificultad para respirar o Company secretary. Siente dolor u opresin en el pecho. Se siente confundido. Estos sntomas pueden Environmental education officer. Solicite ayuda de inmediato. Llame al 911. No espere a ver si los sntomas desaparecen. No conduzca por sus propios medios [employer]. Esta informacin no tiene Theme park manager el consejo del mdico. Asegrese de hacerle al mdico cualquier pregunta que tenga. Document Revised: 12/28/2023 Document Reviewed: 12/28/2023 Elsevier Patient Education  2025 [employer].

## 2024-05-29 NOTE — Assessment & Plan Note (Signed)
 Symptom management discussed Advised to take over-the-counter Mucinex DM Hycodan syrup at bedtime as needed Advised to rest and stay well-hydrated

## 2024-05-29 NOTE — Progress Notes (Signed)
 JEDEDIAH NODA 63 y.o.   Chief Complaint  Patient presents with   Cough    Patient states started Monday. He is having cough, fatigue, runny nose, heaviness in his lung. He went to [facility] to get tested for [facility] and he did test positive. He is taking tylenol  and mucinex states he doesn't know if its really helping. He does want to do the Covid test in office to make sure.     HISTORY OF PRESENT ILLNESS: This is a 63 y.o. male complaining of flulike symptoms that started last Monday.  Tested positive for COVID on Tuesday. Requesting retesting. Has some chest congestion.  Denies difficulty breathing.  Cough Pertinent negatives include no chest pain, chills, fever, headaches, hemoptysis, rash or shortness of breath.     Prior to Admission medications   Medication Sig Start Date End Date Taking? Authorizing Provider  amLODipine  (NORVASC ) 10 MG tablet TAKE 1 TABLET(10 MG) BY MOUTH DAILY 03/05/24  Yes Aditri Louischarles, Isidro Margo, MD  finasteride  (PROSCAR ) 5 MG tablet Take 5 mg by mouth daily. 01/15/23  Yes [provider]  fluocinonide  cream (LIDEX ) 0.05 % APPLY TOPICALLY TO THE AFFECTED AREA TWICE DAILY 03/05/24  Yes Meagan Spease, Isidro Margo, MD  fluticasone  (FLONASE ) 50 MCG/ACT nasal spray Use two sprays in each nostril once daily as directed. 01/10/24  Yes Kozlow, Rema Care, MD  ketoconazole  (NIZORAL ) 2 % shampoo Apply 1 Application topically as needed. 01/04/23  Yes [provider]  montelukast  (SINGULAIR ) 10 MG tablet Take 10 mg by mouth at bedtime.   Yes [provider]  omeprazole  (PRILOSEC) 20 MG capsule Take 1 capsule (20 mg total) by mouth daily. 01/10/24  Yes Kozlow, Rema Care, MD  rosuvastatin  (CRESTOR ) 10 MG tablet TAKE 1 TABLET(10 MG) BY MOUTH DAILY 10/25/23  Yes Sohail Capraro, Isidro Margo, MD  Vitamin D , Ergocalciferol , (DRISDOL ) 1.25 MG (50000 UNIT) CAPS capsule Take 1 capsule (50,000 Units total) by mouth every 7 (seven) days. 05/02/24  Yes Elvira Hammersmith, MD    No Known  Allergies  Patient Active Problem List   Diagnosis Date Noted   Kidney stone 03/21/2023   Mild persistent asthma without complication 11/03/2022   Dyslipidemia associated with type 2 diabetes mellitus (HCC) 06/01/2022   Obstructive uropathy 05/21/2022   Moderate persistent asthma with acute exacerbation 04/28/2022   History of prostate cancer 04/25/2022   History of kidney stones 04/25/2022   Malignant neoplasm of prostate (HCC) 04/19/2022   Enlarged prostate 01/12/2022   Seasonal and perennial allergic rhinoconjunctivitis 04/05/2021   Left upper quadrant abdominal pain 07/24/2019   Prediabetes 04/02/2019   Dyslipidemia 12/23/2014   Psoriasis 06/27/2014   Multiple lung nodules 12/09/2011   PULMONARY HYPERTENSION 03/04/2010   Hypertension associated with diabetes (HCC) 02/10/2010   OBSTRUCTIVE SLEEP APNEA 02/04/2010    Past Medical History:  Diagnosis Date   Allergy    Flonase , Patanol, Zyrtec    Arthritis    psoriasis   Asthma    seasonal, with allergies   Borderline diabetes    Cluster headache    Colon polyp 10/27/2011   Depression    denies 11/11/16; denies 11/21/18 "been years"   Eczema    GERD (gastroesophageal reflux disease)    History of kidney stones    x1   Hyperlipidemia    Hypertension    Low back pain    "in kidney area-was told that it was related to gallstones"   Prediabetes    Prostate cancer (HCC)    Psoriasis  [name]   Severe sleep apnea    h/o; had procedure and weight control, does not have to wear CPAP   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 Repeat 5 years.   CYSTOSCOPY WITH RETROGRADE PYELOGRAM,  URETEROSCOPY AND STENT PLACEMENT Left 05/22/2022   Procedure: CYSTOSCOPY WITH RETROGRADE PYELOGRAM, URETEROSCOPY AND STENT PLACEMENT; TRANSURETHRAL RESECTION OF PROSTATE;  Surgeon: Lahoma Pigg, MD;  Location: [facility];  Service: Urology;  Laterality: Left;   CYSTOSCOPY WITH RETROGRADE PYELOGRAM, URETEROSCOPY AND STENT PLACEMENT Left 06/07/2022   Procedure: CYSTOSCOPY WITH RETROGRADE PYELOGRAM, URETEROSCOPY AND STENT EXCHANGE;  Surgeon: Roxane Copp, MD;  Location: [facility];  Service: Urology;  Laterality: Left;  90 MINS   CYSTOSCOPY/URETEROSCOPY/HOLMIUM LASER/STENT PLACEMENT Left 04/27/2023   Procedure: CYSTOSCOPY LEFT URETEROSCOPY, STONE EXTRACTION, LEFT URETERAL STENT PLACEMENT;  Surgeon: Andrez Banker, MD;  Location[facility];  Service: Urology;  Laterality: Left;   EXTRACORPOREAL SHOCK WAVE LITHOTRIPSY Left 05/09/2022   Procedure: EXTRACORPOREAL SHOCK WAVE LITHOTRIPSY (ESWL);  Surgeon: Osborn Blaze, MD;  Location: [facility];  Service: Urology;  Laterality: Left;   HOLMIUM LASER APPLICATION Left 06/07/2022   Procedure: HOLMIUM LASER APPLICATION;  Surgeon: Roxane Copp, MD;  Location: [facility];  Service: Urology;  Laterality: Left;   POLYPECTOMY     PROSTATE BIOPSY  10/2014   will have another in 3 months   SEPTOPLASTY  2014   TONSILLECTOMY  2014   TRANSURETHRAL RESECTION OF PROSTATE N/A 06/07/2022   Procedure: TRANSURETHRAL RESECTION OF THE PROSTATE (TURP);  Surgeon: Roxane Copp, MD;  Location: [facility];  Service: Urology;  Laterality: N/A;   UVULOPALATOPHARYNGOPLASTY  2014    Social History   Socioeconomic History   Marital status: Divorced    Spouse name: Not on file   Number of children: 1   Years of education: Not on file   Highest education level: Doctorate  Occupational History   Occupation: Magazine features editor: [employer]    Comment: Advice worker  Tobacco Use   Smoking status: Never   Smokeless tobacco: Never   Vaping Use   Vaping status: Never Used  Substance and Sexual Activity   Alcohol use: Never    Alcohol/week: 0.0 standard drinks of alcohol   Drug use: Never   Sexual activity: Yes    Comment: SSP  Other Topics Concern   Not on file  Social History Narrative   Original from [place]; moved to [place]  1995.   Marital status: same sexual partner 4 X years.   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        Tobacco: never      Alcohol: none      Drugs: none       Exercise: daily in 2018; 10,000 steps daily; lots of walking on campus at work  ADLs: independent with ADLs.        Sexual activity: dating males only in 2018; bisexual.  Previously married to male.       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         Social Drivers of Corporate investment banker Strain: Not on file  Food Insecurity: Not on file  Transportation Needs: Not on file  Physical Activity: Not on file  Stress: Not on file  Social Connections: Unknown (08/15/2023)   Received from [facility]   Social Isolation    How often do you see or talk to people that you care about and feel close to?: Not on file  Intimate Partner Violence: Not on file    Family History  Problem Relation Age of Onset   Stroke Mother    Asthma Sister    Colon cancer Neg Hx    Esophageal cancer Neg Hx    Stomach cancer Neg Hx    Prostate cancer Neg Hx    Diabetes Neg Hx    CAD Neg Hx    Migraines Neg Hx    Headache Neg Hx      Review of Systems  Constitutional: Negative.  Negative for chills  and fever.  HENT:  Positive for congestion.   Respiratory:  Positive for cough. Negative for hemoptysis, sputum production and shortness of breath.   Cardiovascular: Negative.  Negative for chest pain and palpitations.  Gastrointestinal:  Negative for abdominal pain, diarrhea, nausea and vomiting.  Skin: Negative.  Negative for rash.  Neurological:  Negative for dizziness and headaches.  All other systems reviewed and are negative.   Vitals:   05/29/24 1309  BP: 104/78  Pulse: 77  Temp: 99.3 F (37.4 C)  SpO2: 97%    Physical Exam Vitals reviewed.  Constitutional:      Appearance: Normal appearance.  HENT:     Head: Normocephalic.  Eyes:     Extraocular Movements: Extraocular movements intact.  Cardiovascular:     Rate and Rhythm: Normal rate.  Pulmonary:     Effort: Pulmonary effort is normal.  Skin:    General: Skin is warm and dry.     Capillary Refill: Capillary refill takes less than 2 seconds.  Neurological:     Mental Status: He is alert and oriented to person, place, and time.  Psychiatric:        Behavior: Behavior normal.    DG Chest 2 View Result Date: 05/29/2024 CLINICAL DATA:  Shortness of breath and chest pain for 3 days. EXAM: CHEST - 2 VIEW COMPARISON:  May 02, 2024. FINDINGS: The heart size and mediastinal contours are within normal limits. Both lungs are clear. The visualized skeletal structures are unremarkable. IMPRESSION: No active cardiopulmonary disease. Electronically Signed   By: Rosalene Colon M.D.   On: 05/29/2024 13:52   Results for orders placed or performed in visit on 05/29/24 (from the past 24 hours)  POC COVID-19     Status: Abnormal   Collection Time: 05/29/24  2:18 PM  Result Value Ref Range   SARS Coronavirus 2 Ag Positive (A) Negative     ASSESSMENT & PLAN: A total of 32 minutes was spent with the patient and counseling/coordination of care regarding preparing for this visit, review of most recent office visit notes, review of  multiple chronic medical conditions under management, diagnosis of COVID  infection and management, review of chest x-ray images done today, symptom management, review of all medications, review of most recent bloodwork results, prognosis, documentation, and need for follow up.   Problem List Items Addressed This Visit       Other   Flu-like symptoms   Symptom management discussed Advised to take over-the-counter Mucinex DM Hycodan syrup at bedtime as needed Advised to rest and stay well-hydrated       Relevant Medications   HYDROcodone  bit-homatropine (HYCODAN) 5-1.5 MG/5ML syrup   Other Relevant Orders   POC COVID-19   COVID-19 virus infection - Primary   Clinically stable.  No red flag signs or symptoms No clinical signs of pneumonia Recommend chest x-ray today.  Will review images when available Recommend to rest and stay well-hydrated Recommend to start Paxlovid  twice a day for 5 days Symptom management discussed ED precautions given Advised to contact the office if no better or worse during the next several days.      Relevant Medications   nirmatrelvir /ritonavir  (PAXLOVID ) 20 x 150 MG & 10 x 100MG  TABS   Other Relevant Orders   DG Chest 2 View   Patient Instructions  COVID-19: lo que debe saber COVID-19: What to Know El COVID-19 es una infeccin causada por un virus que se denomina SARS-CoV-2. Este tipo de virus se llama coronavirus. Las personas con COVID-19 pueden: Tener pocos sntomas o ninguno. Tener sntomas leves o moderados que afectan los pulmones y la respiracin. Enfermarse mucho. Cules son las causas?  El COVID-19 es causado por un virus. Este virus puede encontrarse en el aire en forma de gotitas o en superficies. Puede transmitirse de [place] persona infectada al toser, estornudar, hablar, cantar o respirar. Una persona puede infectarse si: Inhala las gotitas infectadas que estn en el aire. Toca un objeto que tiene el virus. Qu incrementa el  riesgo? Tiene riesgo de contraer COVID-19 si ha estado cerca de alguien con la infeccin. Es ms probable que se enferme gravemente si: Tiene 65 aos o ms. Tiene ciertas enfermedades crnicas, como: Enfermedad cardaca. Diabetes. Enfermedad respiratoria prolongada. Cncer. Embarazo. Est inmunodeprimido. Esto significa que el cuerpo no puede combatir las infecciones con facilidad. Tiene una discapacidad que le dificulta la movilidad, le cuesta moverse o no puede moverse en absoluto. Cules son los signos o sntomas? Los sntomas del COVID-19 pueden ser diferentes en cada persona. Los sntomas tambin pueden variar de leves a muy graves. Generalmente aparecen entre 5 y 6 das despus de la infeccin. Pero pueden tardar [employer] [address] en aparecer. Los sntomas ms frecuentes son: Tos. Cansancio. Nueva prdida del sentido del gusto o del olfato. Izella Marshal. Los sntomas menos frecuentes son: Dolor de Advertising copywriter. Dolor de cabeza. Dolores musculares o corporales. Diarrea. Sarpullido o dedos de las manos o los pies de color diferente al habitual. Los ojos rojos o irritados. A veces, el COVID-19 no causa sntomas. Cmo se diagnostica? El COVID-19 puede diagnosticarse con pruebas realizadas en el laboratorio o en el hogar. Se usa  lquido de la nariz, la boca o los pulmones para detectar si el virus est presente. Cmo se trata? El tratamiento del COVID-19 depende de la gravedad de su enfermedad. Los sntomas leves pueden tratarse en el hogar con reposo, lquidos y medicamentos de [address]. Los sntomas muy graves pueden tratarse en la unidad de cuidados intensivos ([place]) del hospital. Si tiene sntomas y tiene riesgo de enfermarse gravemente, es posible que le administren un medicamento para combatir los virus. Este medicamento se denomina antiviral.  Cmo se previene? Para protegerse del COVID-19: Conozca los factores de [place]. [place]. Si su cuerpo no puede combatir las infecciones con  facilidad, hable con el mdico sobre el tratamiento para ayudar a Research scientist (medical) COVID-19. Mantenga una distancia de al menos 3 pies (1 metro) de Economist. Utilice una mascarilla que se ajuste bien cuando: No pueda mantener la distancia de [employer]. Se encuentre en un lugar con poca circulacin de aire. Trate de estar en espacios abiertos con una buena circulacin de aire cuando est en pblico. Lvese las manos a menudo o use desinfectante de manos a base de alcohol. Cuando tosa o estornude, cbrase la nariz y la boca. Si cree que tiene COVID-19 o estuvo cerca de una persona que lo tiene, qudese en su casa y Scientist, product/process development de otras personas segn le indique el mdico o las autoridades sanitarias. Dnde obtener ms informacin Para obtener ms informacin: Visite [email or web address] Pharmacologist en "Health Topics" (Temas de salud). Escriba "COVID-19" en el cuadro de bsqueda. Visite [email or web address] Pharmacologist en "Health Topics" (Temas de salud). Luego, haga clic en "All Topics" [facility] temas). Escriba "COVID-19" en el cuadro de bsqueda. Solicite ayuda de inmediato si: Tiene dificultad para respirar o Company secretary. Siente dolor u opresin en el pecho. Se siente confundido. Estos sntomas pueden Customer service manager. Solicite ayuda de inmediato. Llame al 911. No espere a ver si los sntomas desaparecen. No conduzca por sus propios medios [employer]. Esta informacin no tiene Theme park manager el consejo del mdico. Asegrese de hacerle al mdico cualquier pregunta que tenga. Document Revised: 12/28/2023 Document Reviewed: 12/28/2023 Elsevier Patient Education  2025 Elsevier Inc.     Maryagnes Small, MD [facility]

## 2024-05-29 NOTE — Assessment & Plan Note (Signed)
 Clinically stable.  No red flag signs or symptoms No clinical signs of pneumonia Recommend chest x-ray today.  Will review images when available Recommend to rest and stay well-hydrated Recommend to start Paxlovid  twice a day for 5 days Symptom management discussed ED precautions given Advised to contact the office if no better or worse during the next several days.

## 2024-07-10 ENCOUNTER — Encounter: Payer: Self-pay | Admitting: Nurse Practitioner

## 2024-07-10 ENCOUNTER — Ambulatory Visit (INDEPENDENT_AMBULATORY_CARE_PROVIDER_SITE_OTHER): Admitting: Nurse Practitioner

## 2024-07-10 VITALS — BP 116/80 | HR 84 | Ht 65.0 in | Wt 189.2 lb

## 2024-07-10 DIAGNOSIS — J4530 Mild persistent asthma, uncomplicated: Secondary | ICD-10-CM | POA: Diagnosis not present

## 2024-07-10 DIAGNOSIS — F5104 Psychophysiologic insomnia: Secondary | ICD-10-CM

## 2024-07-10 DIAGNOSIS — R918 Other nonspecific abnormal finding of lung field: Secondary | ICD-10-CM | POA: Diagnosis not present

## 2024-07-10 DIAGNOSIS — G4700 Insomnia, unspecified: Secondary | ICD-10-CM | POA: Diagnosis not present

## 2024-07-10 DIAGNOSIS — C61 Malignant neoplasm of prostate: Secondary | ICD-10-CM

## 2024-07-10 NOTE — Progress Notes (Signed)
 @Patient  ID: Rickey Cruz, male    DOB: Jun 29, 1961, 63 y.o.   MRN: [id number]  Chief Complaint  Patient presents with   Follow-up    Asthma    Referring provider: Purcell Emil Schanz, *  HPI: 63 year old male, never smoker followed for moderate persistent asthma, pulmonary nodules.  He has patient Dr. Cyndi and last seen in office 04/28/2022 by [facility] NP.  He is followed by radiation oncology for stage IIa prostate cancer.  Past medical history significant for hypertension, pulmonary hypertension, psoriasis, rosacea, HLD, prediabetes.  Previously was diagnosed with OSA and on CPAP therapy.  Underwent nasal septal surgery and UPPP in 2014; repeat sleep study showed that OSA had resolved.  TEST/EVENTS:  08/2019 NPSG: No significant events, TST 6 hours 03/2021 spirometry: Normal 10/31/2021 CXR 2 view: Aortic atherosclerosis.  There are low lung volumes with bibasilar atelectasis.  There is mild perihilar predominant bronchial wall thickening, could reflect bronchitis/bronchiolitis. 05/29/2024 CXR: lungs are clear   09/13/2021: OV with Dr. Jude.  Mild persistent asthma triggered by seasonal allergies.  Not currently using any maintenance inhalers.  Advised him to resume taking Singulair  during the fall.  Counseled that if he should develop any wheezing or other symptoms he should restart his inhaler therapies.  04/28/2022: OV with Jarquez Mestre NP for suspected asthma exacerbation. He reports having a productive cough for the past month with green to yellow sputum. He then began having increased shortness of breath and some wheezing earlier this week. He has also had some mid back pain and is unsure if it is related. He was seen yesterday by Dr. Kozlow with allergy and started on Breztri  as well as given a steroid shot.  Does not feel much better at this point.  He denies any lower extremity swelling, calf pain, hemoptysis, fevers.  Has not had any URI symptoms.  He is taking Breztri  as ordered.  He has yet to  start Singulair  and Claritin .  07/10/2024: Today - follow up Discussed the use of AI scribe software for clinical note transcription with the patient, who gave verbal consent to proceed.  History of Present Illness   Rickey Cruz is a 63 year old male with asthma who presents for a pulmonary follow-up.His asthma is well-controlled with no cough, wheezing, or breathing difficulties. He uses his Breztri  inhaler as needed and has not required steroids or antibiotics. He also uses Singulair  and Claritin  as needed, especially during allergy season.He had a chest x-ray two to three weeks ago after a COVID-19 infection, which showed clear lungs. He was treated with Paxlovid  and feels fully recovered.He recently completed radiation treatment for prostate cancer three weeks ago and is following up with a urologist.He reports experiencing sleep issues that started around the time of his radiation therapy. He thinks it is related to anxiety. Discussing with his PCP. No SI/HI. No current issues with cough, wheezing, or breathing difficulties. No recent use of rescue inhalers or need for prednisone .       No Known Allergies  Immunization History  Administered Date(s) Administered   Hepatitis A, Adult 06/27/2014, 05/19/2016   Hepatitis B, ADULT 06/27/2014, 09/07/2014, 12/28/2014   Influenza,inj,Quad PF,6+ Mos 11/25/2017, 11/01/2018, 08/28/2020, 09/13/2021, 10/26/2022   Meningococcal Conjugate 06/27/2014   Moderna Sars-Covid-2 Vaccination 03/12/2020, 04/14/2020   PNEUMOCOCCAL CONJUGATE-20 05/02/2024   PPD Test 04/13/2009   Pneumococcal Polysaccharide-23 11/25/2017   Tdap 06/27/2014   Zoster Recombinant(Shingrix ) 10/26/2022, 08/01/2023    Past Medical History:  Diagnosis Date   Allergy  Flonase , Patanol, Zyrtec    Arthritis    psoriasis   Asthma    seasonal, with allergies   Borderline diabetes    Cluster headache    Colon polyp 10/27/2011   Depression    denies 11/11/16; denies 11/21/18 been  years   Eczema    GERD (gastroesophageal reflux disease)    History of kidney stones    x1   Hyperlipidemia    Hypertension    Low back pain    in kidney area-was told that it was related to gallstones   Prediabetes    Prostate cancer (HCC)    Psoriasis    [name]   Severe sleep apnea    h/o; had procedure and weight control, does not have to wear CPAP   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 16 g 11   HYDROcodone  bit-homatropine (HYCODAN) 5-1.5 MG/5ML syrup Take 5 mLs by mouth at bedtime as needed for cough. 120 mL 0   ketoconazole  (NIZORAL ) 2 % shampoo Apply 1 Application topically as needed.     rosuvastatin  (CRESTOR ) 10 MG tablet TAKE 1 TABLET(10 MG) BY MOUTH DAILY 90 tablet 3   tamsulosin  (FLOMAX ) 0.4 MG CAPS capsule Take 0.8 mg by mouth.     Vitamin D , Ergocalciferol , (DRISDOL ) 1.25 MG (50000 UNIT) CAPS capsule Take 1 capsule (50,000 Units total) by mouth every 7 (seven) days. 7 capsule 1   finasteride  (PROSCAR ) 5 MG tablet Take 5 mg by mouth daily. (Patient not taking: Reported on 07/10/2024)     montelukast  (SINGULAIR ) 10 MG tablet Take 10 mg by mouth at bedtime. (Patient not taking: Reported on 07/10/2024)     omeprazole  (PRILOSEC) 20 MG capsule Take 1 capsule (20 mg total) by mouth daily. (Patient not taking: Reported on 07/10/2024) 30 capsule 11   No facility-administered medications prior to visit.     Review of Systems:   Constitutional: No weight loss  or gain, night sweats, fevers, chills, fatigue, or lassitude. HEENT: No headaches, sneezing, itching, ear ache, nasal congestion, or post nasal drip CV:  No chest pain, orthopnea, PND, swelling in lower extremities Resp: No shortness of breath. No cough. No hemoptysis. No chest wall deformity GI:  No heartburn, indigestion, abdominal pain, nausea, vomiting, diarrhea, change in bowel habits, loss of appetite, bloody stools.  Psych: No depression. +anxiety. Mood stable. No SI/HI.    Physical Exam:  BP 116/80 (BP Location: Left Arm, Patient Position: Sitting, Cuff Size: Large)   Pulse 84   Ht 5' 5 (1.651 m)   Wt 189 lb 3.2 oz (85.8 kg)   SpO2 96%   BMI 31.48 kg/m   GEN: Pleasant, interactive, well-appearing; obese; in no acute distress. HEENT:  Normocephalic and atraumatic. PERRLA. Sclera white. Nasal turbinates pink, moist and patent bilaterally. No rhinorrhea present. Oropharynx pink and moist, without exudate or edema. No lesions, ulcerations, or postnasal drip.  NECK:  Supple w/ fair ROM.  CV: RRR, no m/r/g,  no peripheral edema. Pulses intact, +2 bilaterally. No cyanosis, pallor or clubbing. PULMONARY:  Unlabored, regular breathing. Clear bilaterally A&P w/o wheezes/rales/rhonchi. No accessory muscle use. No dullness to percussion. GI: BS present and normoactive. Soft, non-tender to palpation. No organomegaly or masses detected.  MSK: No erythema, warmth or tenderness. Cap refil <2 sec all extrem.  Neuro: A/Ox3. No focal deficits noted.   Skin: Warm, no lesions or rashe Psych: Normal affect and behavior. Judgement and thought content appropriate.     Lab Results:  CBC    Component Value Date/Time   WBC 4.0 05/02/2024 0913   RBC 5.31 05/02/2024 0913   HGB 16.5 05/02/2024 0913   HGB 16.6 12/06/2019 1506   HCT 47.1 05/02/2024 0913   HCT 47.9 12/06/2019 1506   PLT 256.0 05/02/2024 0913   PLT 249 12/06/2019 1506   MCV 88.7 05/02/2024 0913   MCV 90 12/06/2019 1506   MCH  30.2 06/06/2022 0342   MCHC 35.1 05/02/2024 0913   RDW 13.3 05/02/2024 0913   RDW 13.2 12/06/2019 1506   LYMPHSABS 0.8 05/02/2024 0913   LYMPHSABS 0.7 09/27/2018 1455   MONOABS 0.5 05/02/2024 0913   EOSABS 0.1 05/02/2024 0913   EOSABS 0.0 09/27/2018 1455   BASOSABS 0.0 05/02/2024 0913   BASOSABS 0.0 09/27/2018 1455    BMET    Component Value Date/Time   NA 139 05/02/2024 0913   NA 147 (H) 11/12/2020 1727   K 3.2 (L) 05/02/2024 0913   CL 103 05/02/2024 0913   CO2 26 05/02/2024 0913   GLUCOSE 139 (H) 05/02/2024 0913   BUN 15 05/02/2024 0913   BUN 24 11/12/2020 1727   CREATININE 1.03 05/02/2024 0913   CREATININE 1.04 09/21/2016 1119   CALCIUM  9.1 05/02/2024 0913   GFRNONAA >60 06/06/2022 0342   GFRNONAA 81 09/09/2015 0912   GFRAA 91 11/12/2020 1727   GFRAA >89 09/09/2015 0912    BNP No results found for: BNP   Imaging:  No results found.  Administration History     None           No data to display          No results found for: NITRICOXIDE      Assessment & Plan:   Mild persistent asthma without complication Stable without recent exacerbations or hospitalizations. Well managed on singulair . Occasional use of Breztri  but no scheduled bronchodilators. Action plan in place. Trigger prevention reviewed.   Patient Instructions  Continue Breztri  2 puffs Twice daily as needed. Brush tongue and rinse mouth afterwards Continue Albuterol  inhaler 2 puffs or 3 mL neb every 6 hours as needed for shortness of breath or wheezing. Notify if symptoms persist despite rescue inhaler/neb use. Continue Flonase  nasal spray 2 sprays each nostril daily Continue Claritin  as directed by allergist Continue Singulair  10 mg at bedtime  Call me if your sleep doesn't get better over the next 4-6 weeks    Follow up in 1 year with Dr. Jude or Izetta Malachy PIETY. If symptoms do not improve or worsen, please contact office for sooner follow up or seek emergency  care.   Multiple lung nodules Stable on prior. No dedicated follow up required.   Insomnia Seems to be related to anxiety. Advised to monitor and discuss with PCP. Notify PCP if symptoms fail to improve. Sleep hygiene reviewed.   Malignant neoplasm of prostate (HCC) Completed radiation treatments. Follow up with urology as scheduled   I spent 25 minutes of dedicated to the care of  this patient on the date of this encounter to include pre-visit review of records, face-to-face time with the patient discussing conditions above, post visit ordering of testing, clinical documentation with the electronic health record, making appropriate referrals as documented, and communicating necessary findings to members of the patients care team.  Comer LULLA Rouleau, NP 07/10/2024  Pt aware and understands NP's role.

## 2024-07-10 NOTE — Assessment & Plan Note (Signed)
 Completed radiation treatments. Follow up with urology as scheduled

## 2024-07-10 NOTE — Assessment & Plan Note (Deleted)
 [email or web address]

## 2024-07-10 NOTE — Progress Notes (Deleted)
 @Patient  ID: Rickey Cruz, male    DOB: 1961-10-21, 63 y.o.   MRN: [id number]  Chief Complaint  Patient presents with   Follow-up    Asthma    Referring provider: Purcell Emil Schanz, *  HPI:   TEST/EVENTS:   No Known Allergies  Immunization History  Administered Date(s) Administered   Hepatitis A, Adult 06/27/2014, 05/19/2016   Hepatitis B, ADULT 06/27/2014, 09/07/2014, 12/28/2014   Influenza,inj,Quad PF,6+ Mos 11/25/2017, 11/01/2018, 08/28/2020, 09/13/2021, 10/26/2022   Meningococcal Conjugate 06/27/2014   Moderna Sars-Covid-2 Vaccination 03/12/2020, 04/14/2020   PNEUMOCOCCAL CONJUGATE-20 05/02/2024   PPD Test 04/13/2009   Pneumococcal Polysaccharide-23 11/25/2017   Tdap 06/27/2014   Zoster Recombinant(Shingrix ) 10/26/2022, 08/01/2023    Past Medical History:  Diagnosis Date   Allergy    Flonase , Patanol, Zyrtec    Arthritis    psoriasis   Asthma    seasonal, with allergies   Borderline diabetes    Cluster headache    Colon polyp 10/27/2011   Depression    denies 11/11/16; denies 11/21/18 been years   Eczema    GERD (gastroesophageal reflux disease)    History of kidney stones    x1   Hyperlipidemia    Hypertension    Low back pain    in kidney area-was told that it was related to gallstones   Prediabetes    Prostate cancer (HCC)    Psoriasis    [name]   Severe sleep apnea    h/o; had procedure and weight control, does not have to wear CPAP   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 16 g 11   HYDROcodone  bit-homatropine (HYCODAN) 5-1.5 MG/5ML syrup Take 5 mLs by mouth at bedtime as needed for cough. 120 mL 0   ketoconazole  (NIZORAL ) 2 % shampoo Apply 1 Application topically as needed.     rosuvastatin  (CRESTOR ) 10 MG tablet TAKE 1 TABLET(10 MG) BY MOUTH DAILY 90 tablet 3   tamsulosin  (FLOMAX ) 0.4 MG CAPS capsule Take 0.8 mg by mouth.     Vitamin D , Ergocalciferol , (DRISDOL ) 1.25 MG (50000 UNIT) CAPS capsule Take 1 capsule (50,000 Units total) by mouth every 7 (seven) days. 7 capsule 1   finasteride  (PROSCAR ) 5 MG tablet Take 5 mg by mouth daily. (Patient not taking: Reported on 07/10/2024)     montelukast  (SINGULAIR ) 10 MG tablet Take 10 mg by mouth at bedtime. (Patient not taking: Reported on 07/10/2024)     omeprazole  (PRILOSEC) 20 MG capsule Take 1 capsule (20 mg total) by mouth daily. (Patient not taking: Reported on 07/10/2024) 30 capsule 11   No facility-administered medications prior to visit.     Review of Systems:   Constitutional: No weight loss or gain, night sweats, fevers, chills, fatigue, or lassitude. HEENT: No headaches, difficulty swallowing, tooth/dental problems, or sore throat. No sneezing, itching, ear ache, nasal congestion, or post nasal drip CV:  No chest pain, orthopnea, PND, swelling in lower extremities, anasarca,  dizziness, palpitations, syncope Resp: No shortness of breath with exertion or at rest. No excess mucus or change in color of mucus. No productive or non-productive. No hemoptysis. No wheezing.  No chest wall deformity GI:  No heartburn, indigestion, abdominal pain, nausea, vomiting, diarrhea, change in bowel habits, loss of appetite, bloody stools.  GU: No dysuria, change in color of urine, urgency or frequency.  No flank pain, no hematuria  Skin: No rash, lesions, ulcerations MSK:  No joint pain or swelling.  No decreased range of motion.  No back pain. Neuro: No dizziness or  lightheadedness.  Psych: No depression or anxiety. Mood stable.     Physical Exam:  BP 116/80 (BP Location: Left Arm, Patient Position: Sitting, Cuff Size: Large)   Pulse 84   Ht 5' 5 (1.651 m)   Wt 189 lb 3.2 oz (85.8 kg)   SpO2 96%   BMI 31.48 kg/m   GEN: Pleasant, interactive, well-nourished/chronically-ill appearing/acutely-ill appearing/poorly-nourished/morbidly obese; in no acute distress.****** HEENT:  Normocephalic and atraumatic. EACs patent bilaterally. TM pearly gray with present light reflex bilaterally. PERRLA. Sclera white. Nasal turbinates pink, moist and patent bilaterally. No rhinorrhea present. Oropharynx pink and moist, without exudate or edema. No lesions, ulcerations, or postnasal drip.  NECK:  Supple w/ fair ROM. No JVD present. Normal carotid impulses w/o bruits. Thyroid symmetrical with no goiter or nodules palpated. No lymphadenopathy.   CV: RRR, no m/r/g, no peripheral edema. Pulses intact, +2 bilaterally. No cyanosis, pallor or clubbing. PULMONARY:  Unlabored, regular breathing. Clear bilaterally A&P w/o wheezes/rales/rhonchi. No accessory muscle use.  GI: BS present and normoactive. Soft, non-tender to palpation. No organomegaly or masses detected. No CVA tenderness. MSK: No erythema, warmth or tenderness. Cap refil <2 sec all extrem. No deformities or joint swelling noted.  Neuro: A/Ox3. No focal deficits noted.   Skin: Warm, no lesions or rashe Psych: Normal affect and behavior. Judgement and thought content appropriate.     Lab Results:  CBC    Component Value Date/Time   WBC 4.0 05/02/2024 0913   RBC 5.31 05/02/2024 0913   HGB 16.5 05/02/2024 0913   HGB 16.6 12/06/2019 1506   HCT 47.1 05/02/2024 0913   HCT 47.9 12/06/2019 1506   PLT 256.0 05/02/2024 0913   PLT 249 12/06/2019 1506   MCV 88.7 05/02/2024 0913   MCV 90 12/06/2019 1506   MCH 30.2 06/06/2022 0342   MCHC 35.1 05/02/2024 0913   RDW 13.3 05/02/2024 0913   RDW 13.2 12/06/2019 1506    LYMPHSABS 0.8 05/02/2024 0913   LYMPHSABS 0.7 09/27/2018 1455   MONOABS 0.5 05/02/2024 0913   EOSABS 0.1 05/02/2024 0913   EOSABS 0.0 09/27/2018 1455   BASOSABS 0.0 05/02/2024 0913   BASOSABS 0.0 09/27/2018 1455    BMET    Component Value Date/Time   NA 139 05/02/2024 0913   NA 147 (H) 11/12/2020 1727   K 3.2 (L) 05/02/2024 0913   CL 103 05/02/2024 0913   CO2 26 05/02/2024 0913   GLUCOSE 139 (H) 05/02/2024 0913   BUN 15 05/02/2024 0913   BUN 24 11/12/2020 1727   CREATININE 1.03 05/02/2024 0913   CREATININE 1.04 09/21/2016 1119   CALCIUM  9.1 05/02/2024 0913   GFRNONAA >60 06/06/2022 0342   GFRNONAA 81 09/09/2015 0912   GFRAA 91 11/12/2020 1727   GFRAA >89 09/09/2015 0912    BNP No results found for: BNP   Imaging:  No results found.  Administration History     None  No data to display          No results found for: NITRICOXIDE   Assessment & Plan:   No problem-specific Assessment & Plan notes found for this encounter.    Advised if symptoms do not improve or worsen, to please contact office for sooner follow up or seek emergency care.   I spent *** minutes of dedicated to the care of this patient on the date of this encounter to include pre-visit review of records, face-to-face time with the patient discussing conditions above, post visit ordering of testing, clinical documentation with the electronic health record, making appropriate referrals as documented, and communicating necessary findings to members of the patients care team.  Comer LULLA Rouleau, NP 07/10/2024  Pt aware and understands NP's role.

## 2024-07-10 NOTE — Assessment & Plan Note (Signed)
 Seems to be related to anxiety. Advised to monitor and discuss with PCP. Notify PCP if symptoms fail to improve. Sleep hygiene reviewed.

## 2024-07-10 NOTE — Assessment & Plan Note (Signed)
 Stable without recent exacerbations or hospitalizations. Well managed on singulair . Occasional use of Breztri  but no scheduled bronchodilators. Action plan in place. Trigger prevention reviewed.   Patient Instructions  Continue Breztri  2 puffs Twice daily as needed. Brush tongue and rinse mouth afterwards Continue Albuterol  inhaler 2 puffs or 3 mL neb every 6 hours as needed for shortness of breath or wheezing. Notify if symptoms persist despite rescue inhaler/neb use. Continue Flonase  nasal spray 2 sprays each nostril daily Continue Claritin  as directed by allergist Continue Singulair  10 mg at bedtime  Call me if your sleep doesn't get better over the next 4-6 weeks    Follow up in 1 year with Dr. Jude or Rickey Cruz. If symptoms do not improve or worsen, please contact office for sooner follow up or seek emergency care.

## 2024-07-10 NOTE — Patient Instructions (Addendum)
 Continue Breztri  2 puffs Twice daily as needed. Brush tongue and rinse mouth afterwards Continue Albuterol  inhaler 2 puffs or 3 mL neb every 6 hours as needed for shortness of breath or wheezing. Notify if symptoms persist despite rescue inhaler/neb use. Continue Flonase  nasal spray 2 sprays each nostril daily Continue Claritin  as directed by allergist Continue Singulair  10 mg at bedtime  Call me if your sleep doesn't get better over the next 4-6 weeks    Follow up in 1 year with Dr. Jude or Izetta Malachy PIETY. If symptoms do not improve or worsen, please contact office for sooner follow up or seek emergency care.

## 2024-07-10 NOTE — Assessment & Plan Note (Signed)
 Stable on prior. No dedicated follow up required.

## 2024-08-28 ENCOUNTER — Other Ambulatory Visit: Payer: Self-pay | Admitting: Emergency Medicine

## 2024-08-28 DIAGNOSIS — E785 Hyperlipidemia, unspecified: Secondary | ICD-10-CM

## 2024-09-16 ENCOUNTER — Other Ambulatory Visit: Payer: Self-pay | Admitting: Radiology

## 2024-09-16 ENCOUNTER — Encounter: Payer: Self-pay | Admitting: Emergency Medicine

## 2024-09-16 DIAGNOSIS — R918 Other nonspecific abnormal finding of lung field: Secondary | ICD-10-CM

## 2024-09-16 NOTE — Telephone Encounter (Signed)
 Recommend pulmonary evaluation regarding this finding.  Please place consult.  Thanks.

## 2024-09-17 NOTE — Telephone Encounter (Signed)
 Outgoing call to patient to obtain where images in [facility] were done. No answer lvm.

## 2024-09-18 NOTE — Telephone Encounter (Signed)
 Please advise on attached ct report

## 2024-09-19 NOTE — Telephone Encounter (Signed)
 Duplicate

## 2024-09-19 NOTE — Telephone Encounter (Signed)
 Please find out if he is having any acute symptoms of SOB, cough, CP, hemoptysis. If so, needs to go to the ED. If not and he develops any of these, needs to go to the ED. I am going to discuss with Dr. Jude. Thanks.

## 2024-09-19 NOTE — Telephone Encounter (Signed)
 Patient is not having any symptoms listed,having upper back pain on the left side,is currently at [facility] and informed him to make sure they see that scan while there.

## 2024-09-20 ENCOUNTER — Telehealth: Payer: Self-pay

## 2024-09-20 NOTE — Telephone Encounter (Signed)
 Copied from CRM #[id number]. Topic: Clinical - Medical Advice >> Sep 18, 2024  2:37 PM Devaughn RAMAN wrote: Reason for CRM: Patient would like medical advice on the message sent in MyChart to NP Cobb regarding his prostate. Patient stated he would like a follow up callback regarding this as he has an appointment. Patient is requesting to speak with NP Cobb potentially for an appointment for tomorrow after 2:00pm. Patient stated he will be busy with appointments but will be available tomorrow afternoon to discuss the attachment.    VM/LM  okay per DPR  - MS. Cobb is having another provider look over the scan and someone will reach out.  -NFN

## 2024-11-04 ENCOUNTER — Ambulatory Visit: Admitting: Emergency Medicine

## 2024-11-04 ENCOUNTER — Encounter: Payer: Self-pay | Admitting: Emergency Medicine

## 2024-11-04 VITALS — BP 118/82 | HR 76 | Temp 98.0°F | Ht 65.0 in | Wt 193.0 lb

## 2024-11-04 DIAGNOSIS — I152 Hypertension secondary to endocrine disorders: Secondary | ICD-10-CM

## 2024-11-04 DIAGNOSIS — E1169 Type 2 diabetes mellitus with other specified complication: Secondary | ICD-10-CM

## 2024-11-04 DIAGNOSIS — E1159 Type 2 diabetes mellitus with other circulatory complications: Secondary | ICD-10-CM | POA: Diagnosis not present

## 2024-11-04 DIAGNOSIS — Z86711 Personal history of pulmonary embolism: Secondary | ICD-10-CM | POA: Diagnosis not present

## 2024-11-04 DIAGNOSIS — Z8546 Personal history of malignant neoplasm of prostate: Secondary | ICD-10-CM

## 2024-11-04 DIAGNOSIS — E785 Hyperlipidemia, unspecified: Secondary | ICD-10-CM

## 2024-11-04 LAB — POCT GLYCOSYLATED HEMOGLOBIN (HGB A1C): HbA1c POC (<> result, manual entry): 7.2 % (ref 4.0–5.6)

## 2024-11-04 NOTE — Assessment & Plan Note (Signed)
 Successfully treated with radiation Stable.  No concerns.

## 2024-11-04 NOTE — Patient Instructions (Signed)
 Mantenimiento de Research officer, political party, Male Adoptar un estilo de vida saludable y recibir atencin preventiva son importantes para promover la salud y Counsellor. Consulte al mdico sobre: El esquema adecuado para hacerse pruebas y exmenes peridicos. Cosas que puede hacer por su cuenta para prevenir enfermedades y [place] sano. Qu debo saber sobre la dieta, el peso y el ejercicio? Consuma una dieta saludable  Consuma una dieta que incluya muchas verduras, frutas, productos lcteos con bajo contenido de [place] y Associate Professor. No consuma muchos alimentos ricos en grasas slidas, azcares agregados o sodio. Mantenga un peso saludable El ndice de masa muscular [facility]) es una medida que puede utilizarse para identificar posibles problemas de [place]. Proporciona una estimacin de la grasa corporal basndose en el peso y la altura. Su mdico puede ayudarle a Engineer, site IMC y a Personnel officer o Pharmacologist un peso saludable. Haga ejercicio con regularidad Haga ejercicio con regularidad. Esta es una de las prcticas ms importantes que puede hacer por su salud. La [employer] de los adultos deben seguir estas pautas: Education officer, environmental, al menos, 150 minutos de actividad fsica por semana. El ejercicio debe aumentar la frecuencia cardaca y Media planner transpirar (ejercicio de intensidad moderada). Hacer ejercicios de fortalecimiento por lo [employer] por semana. Agregue esto a su plan de ejercicio de intensidad moderada. Pase menos tiempo sentado. Incluso la actividad fsica ligera puede ser beneficiosa. Controle sus niveles de colesterol y lpidos en la sangre Comience a realizarse anlisis de lpidos y Oncologist en la sangre a los 20 aos y luego reptalos cada 5 aos. Es posible que Insurance underwriter los niveles de colesterol con mayor frecuencia si: Sus niveles de lpidos y colesterol son altos. Es mayor de 40 aos. Presenta un alto riesgo de padecer enfermedades cardacas. Qu debo  saber sobre las pruebas de deteccin del cncer? Muchos tipos de cncer pueden detectarse de manera temprana y, a menudo, pueden prevenirse. Segn su historia clnica y sus antecedentes familiares, es posible que deba realizarse pruebas de deteccin del cncer en diferentes edades. Esto puede incluir pruebas de deteccin de lo siguiente: Building services engineer. Cncer de prstata. Cncer de piel. Cncer de pulmn. Qu debo saber sobre la enfermedad cardaca, la diabetes y la hipertensin arterial? Presin arterial y enfermedad cardaca La hipertensin arterial causa enfermedades cardacas y [place] el riesgo de accidente cerebrovascular. Es ms probable que esto se manifieste en las personas que tienen lecturas de presin arterial alta o tienen sobrepeso. Hable con el mdico sobre sus valores de presin arterial deseados. Hgase controlar la presin arterial: Cada 3 a 5 aos si tiene entre 18 y 71 aos. Todos los aos si es mayor de 40 aos. Si tiene entre 65 y 26 aos y es fumador o Insurance underwriter, pregntele al mdico si debe realizarse una prueba de deteccin de aneurisma artico abdominal (AAA) por nica vez. Diabetes Realcese exmenes de deteccin de la diabetes con regularidad. Este anlisis revisa el nivel de azcar en la sangre en [place]. Hgase las pruebas de deteccin: Cada tres aos despus de los 45 aos de edad si tiene un peso normal y un bajo riesgo de padecer diabetes. Con ms frecuencia y a partir de [place] edad inferior si tiene sobrepeso o un alto riesgo de padecer diabetes. Qu debo saber sobre la prevencin de infecciones? Hepatitis B Si tiene un riesgo ms alto de contraer hepatitis B, debe someterse a un examen de deteccin de este virus. Hable con el mdico para averiguar si tiene riesgo de  contraer la infeccin por hepatitis B. Hepatitis C Se recomienda un anlisis de Benjamin Perez para: Todos los que nacieron entre 1945 y [phone]. Todas las personas que tengan un riesgo de haber  contrado hepatitis C. Enfermedades de transmisin sexual (ETS) Debe realizarse pruebas de deteccin de ITS todos los aos, incluidas la gonorrea y la clamidia, si: Es sexualmente activo y es menor de [address]. Es mayor de [address], y Public affairs consultant informa que corre riesgo de tener este tipo de infecciones. La actividad sexual ha cambiado desde que le hicieron la ltima prueba de deteccin y tiene un riesgo mayor de Warehouse manager clamidia o Copy. Pregntele al mdico si usted tiene riesgo. Pregntele al mdico si usted tiene un alto riesgo de Primary school teacher VIH. El mdico tambin puede recomendarle un medicamento recetado para ayudar a evitar la infeccin por el VIH. Si elige tomar medicamentos para prevenir el VIH, primero debe [employer] de deteccin del VIH. Luego debe hacerse anlisis cada 3 meses mientras est tomando los medicamentos. Siga estas indicaciones en su casa: Consumo de alcohol No beba alcohol si el mdico se lo prohbe. Si bebe alcohol: Limite la cantidad que consume de 0 a 2 bebidas por da. Sepa cunta cantidad de alcohol hay en las bebidas que toma. En los [address], una medida equivale a una botella de cerveza de 12 oz (355 ml), un vaso de vino de 5 oz (148 ml) o un vaso de una bebida alcohlica de alta graduacin de 1 oz (44 ml). Estilo de vida No consuma ningn producto que contenga nicotina o tabaco. Estos productos incluyen cigarrillos, tabaco para Theatre manager y aparatos de vapeo, como los Administrator, Civil Service. Si necesita ayuda para dejar de consumir estos productos, consulte al mdico. No consuma drogas. No comparta agujas. Solicite ayuda a su mdico si necesita apoyo o informacin para abandonar las drogas. Indicaciones generales Realcese los estudios de rutina de [address], dentales y de Wellsite geologist. Mantngase al da con las vacunas. Infrmele a su mdico si: Se siente deprimido con frecuencia. Alguna vez ha sido vctima de [place] o no se siente seguro en su  casa. Resumen Adoptar un estilo de vida saludable y recibir atencin preventiva son importantes para promover la salud y Counsellor. Siga las instrucciones del mdico acerca de una dieta saludable, el ejercicio y la realizacin de pruebas o exmenes para Hotel manager. Siga las instrucciones del mdico con respecto al control del colesterol y la presin arterial. Esta informacin no tiene Theme park manager el consejo del mdico. Asegrese de hacerle al mdico cualquier pregunta que tenga. Document Revised: 05/19/2021 Document Reviewed: 05/19/2021 Elsevier Patient Education  2024 [employer].

## 2024-11-04 NOTE — Progress Notes (Signed)
 Rickey Cruz 63 y.o.   Chief Complaint  Patient presents with   Follow-up    No concerns     HISTORY OF PRESENT ILLNESS: This is a 63 y.o. male here for 6 months follow-up of chronic medical conditions Received radiation treatment for prostate cancer.  Well-tolerated Last month went to urgent care center due to lower abdominal pain and CT scan revealed small pulmonary embolism Was started on Eliquis.  Plan is to continue for 6 months Told embolus most likely from gel used during prostate cancer treatment to protect anus from radiation No other complaints or medical concerns today.  HPI   Prior to Admission medications   Medication Sig Start Date End Date Taking? Authorizing Provider  amLODipine  (NORVASC ) 10 MG tablet TAKE 1 TABLET(10 MG) BY MOUTH DAILY 03/05/24  Yes Kataleena Holsapple, [place], MD  ELIQUIS 5 MG TABS tablet Take 5 mg by mouth 2 (two) times daily.   Yes [provider]  fluocinonide  cream (LIDEX ) 0.05 % APPLY TOPICALLY TO THE AFFECTED AREA TWICE DAILY 03/05/24  Yes Emmitt Matthews, Emil Schanz, MD  fluticasone  (FLONASE ) 50 MCG/ACT nasal spray Use two sprays in each nostril once daily as directed. 01/10/24  Yes Kozlow, Camellia PARAS, MD  HYDROcodone  bit-homatropine (HYCODAN) 5-1.5 MG/5ML syrup Take 5 mLs by mouth at bedtime as needed for cough. 05/29/24  Yes Jacquelin Krajewski, Emil Schanz, MD  ketoconazole  (NIZORAL ) 2 % shampoo Apply 1 Application topically as needed. 01/04/23  Yes [provider]  rosuvastatin  (CRESTOR ) 10 MG tablet TAKE 1 TABLET(10 MG) BY MOUTH DAILY 08/29/24  Yes Demitris Pokorny, Emil Schanz, MD  tamsulosin  (FLOMAX ) 0.4 MG CAPS capsule Take 0.8 mg by mouth. 04/26/24  Yes [provider]  Vitamin D , Ergocalciferol , (DRISDOL ) 1.25 MG (50000 UNIT) CAPS capsule Take 1 capsule (50,000 Units total) by mouth every 7 (seven) days. 05/02/24  Yes Manasa Spease Jose, MD  VTAMA 1 % CREA Apply topically daily.   Yes [provider]  finasteride  (PROSCAR ) 5 MG tablet  Take 5 mg by mouth daily. Patient not taking: Reported on 11/04/2024 01/15/23   [provider]  montelukast  (SINGULAIR ) 10 MG tablet Take 10 mg by mouth at bedtime. Patient not taking: Reported on 11/04/2024    [provider]  omeprazole  (PRILOSEC) 20 MG capsule Take 1 capsule (20 mg total) by mouth daily. Patient not taking: Reported on 11/04/2024 01/10/24   Maurilio Camellia PARAS, MD    No Known Allergies  Patient Active Problem List   Diagnosis Date Noted   Insomnia 07/10/2024   Flu-like symptoms 05/29/2024   COVID-19 virus infection 05/29/2024   Kidney stone 03/21/2023   Mild persistent asthma without complication 11/03/2022   Dyslipidemia associated with type 2 diabetes mellitus (HCC) 06/01/2022   Obstructive uropathy 05/21/2022   Mild persistent asthma 04/28/2022   History of prostate cancer 04/25/2022   History of kidney stones 04/25/2022   Malignant neoplasm of prostate (HCC) 04/19/2022   Enlarged prostate 01/12/2022   Seasonal and perennial allergic rhinoconjunctivitis 04/05/2021   Left upper quadrant abdominal pain 07/24/2019   Prediabetes 04/02/2019   Dyslipidemia 12/23/2014   Psoriasis 06/27/2014   Multiple lung nodules 12/09/2011   PULMONARY HYPERTENSION 03/04/2010   Hypertension associated with diabetes (HCC) 02/10/2010   OBSTRUCTIVE SLEEP APNEA 02/04/2010    Past Medical History:  Diagnosis Date   Allergy    Flonase , Patanol, Zyrtec    Arthritis    psoriasis   Asthma    seasonal, with allergies   Borderline diabetes  Cluster headache    Colon polyp 10/27/2011   Depression    denies 11/11/16; denies 11/21/18 been years   Eczema    GERD (gastroesophageal reflux disease)    History of kidney stones    x1   Hyperlipidemia    Hypertension    Low back pain    in kidney area-was told that it was related to gallstones   Prediabetes    Prostate cancer (HCC)    Psoriasis    [name]   Severe sleep apnea    h/o; had  procedure and weight control, does not have to wear CPAP   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 Repeat 5 years.   CYSTOSCOPY WITH RETROGRADE PYELOGRAM, URETEROSCOPY AND STENT PLACEMENT Left 05/22/2022   Procedure: CYSTOSCOPY WITH RETROGRADE PYELOGRAM, URETEROSCOPY AND STENT PLACEMENT; TRANSURETHRAL RESECTION OF PROSTATE;  Surgeon: Selma Donnice SAUNDERS, MD;  Location: [facility];  Service: Urology;  Laterality: Left;   CYSTOSCOPY WITH RETROGRADE PYELOGRAM, URETEROSCOPY AND STENT PLACEMENT Left 06/07/2022   Procedure: CYSTOSCOPY WITH RETROGRADE PYELOGRAM, URETEROSCOPY AND STENT EXCHANGE;  Surgeon: Elisabeth Valli BIRCH, MD;  Location: [facility];  Service: Urology;  Laterality: Left;  90 MINS   CYSTOSCOPY/URETEROSCOPY/HOLMIUM LASER/STENT PLACEMENT Left 04/27/2023   Procedure: CYSTOSCOPY LEFT URETEROSCOPY, STONE EXTRACTION, LEFT URETERAL STENT PLACEMENT;  Surgeon: Cam Morene ORN, MD;  Location[facility];  Service: Urology;  Laterality: Left;   EXTRACORPOREAL SHOCK WAVE LITHOTRIPSY Left 05/09/2022   Procedure: EXTRACORPOREAL SHOCK WAVE LITHOTRIPSY (ESWL);  Surgeon: Alvaro Hummer, MD;  Location: [facility];  Service: Urology;  Laterality: Left;   HOLMIUM LASER APPLICATION Left 06/07/2022   Procedure: HOLMIUM LASER APPLICATION;  Surgeon: Elisabeth Valli BIRCH, MD;  Location: [facility];  Service: Urology;  Laterality: Left;   POLYPECTOMY     PROSTATE BIOPSY  10/2014   will have another in 3 months   SEPTOPLASTY  2014   TONSILLECTOMY  2014   TRANSURETHRAL RESECTION OF PROSTATE N/A 06/07/2022   Procedure: TRANSURETHRAL RESECTION OF THE PROSTATE (TURP);  Surgeon: Elisabeth Valli BIRCH, MD;  Location: [facility];  Service: Urology;  Laterality: N/A;   UVULOPALATOPHARYNGOPLASTY  2014    Social History   Socioeconomic History   Marital status: Divorced    Spouse name: Not on file   Number of children: 1   Years of education: Not on file   Highest education level: Doctorate  Occupational History   Occupation: Magazine Features Editor: [employer]    Comment: Advice Worker  Tobacco Use   Smoking status: Never   Smokeless tobacco: Never  Vaping Use   Vaping status: Never Used  Substance and Sexual Activity   Alcohol use: Never    Alcohol/week: 0.0 standard drinks of alcohol   Drug use: Never   Sexual activity: Yes    Comment: SSP  Other Topics Concern   Not on file  Social History Narrative   Original from [place]; moved to [place]  1995.   Marital status: same sexual partner 4 X years.   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        Tobacco: never      Alcohol: none      Drugs: none       Exercise: daily in 2018; 10,000 steps daily; lots of walking on campus at work      ADLs: independent with ADLs.        Sexual activity: dating males only in 2018; bisexual.  Previously married to male.       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         Social Drivers of Corporate Investment Banker Strain: Not on file  Food Insecurity: Low Risk  (09/19/2024)   Received from [facility]   Hunger Vital Sign    Within the past 12 months, you worried that your food would run out before you got money to buy more: Never true     Within the past 12 months, the food you bought just didn't last and you didn't have money to get more. : Never true  Transportation Needs: No Transportation Needs (09/19/2024)   Received from [facility]    In the past 12 months, has lack of reliable transportation kept you from medical appointments, meetings, work or from getting things needed for daily living? : No  Physical Activity: Not on file  Stress: Not on file  Social Connections: Unknown (08/15/2023)   Received from [facility]   Social Isolation    How often do you see or talk to people that you care about and feel close to?: Not on file  Intimate Partner Violence: Not on file    Family History  Problem Relation Age of Onset   Stroke Mother    Asthma Sister    Colon cancer Neg Hx    Esophageal cancer Neg Hx    Stomach cancer Neg Hx    Prostate cancer Neg Hx    Diabetes Neg Hx    CAD Neg Hx    Migraines Neg Hx    Headache Neg Hx      Review of Systems  Constitutional: Negative.  Negative for chills and fever.  HENT: Negative.  Negative for congestion and sore throat.   Respiratory: Negative.  Negative for cough and shortness of breath.   Cardiovascular: Negative.  Negative for chest pain and palpitations.  Gastrointestinal:  Negative for abdominal pain, nausea and vomiting.  Genitourinary: Negative.  Negative for dysuria and hematuria.  Skin: Negative.  Negative for rash.  Neurological: Negative.  Negative for dizziness and headaches.  All other systems reviewed and are negative.   Vitals:   11/04/24 0822  BP: 118/82  Pulse: 76  Temp: 98 F (36.7 C)  SpO2: 97%    Physical Exam Vitals reviewed.  Constitutional:      Appearance: Normal appearance.  HENT:     Head: Normocephalic.     Mouth/Throat:     Mouth: Mucous membranes are moist.     Pharynx: Oropharynx is clear.  Eyes:     Extraocular Movements: Extraocular movements intact.     Pupils: Pupils are equal, round, and  reactive to light.  Cardiovascular:     Rate and Rhythm:  Normal rate and regular rhythm.     Pulses: Normal pulses.     Heart sounds: Normal heart sounds.  Pulmonary:     Effort: Pulmonary effort is normal.     Breath sounds: Normal breath sounds.  Musculoskeletal:     Cervical back: No tenderness.  Lymphadenopathy:     Cervical: No cervical adenopathy.  Skin:    General: Skin is warm and dry.     Capillary Refill: Capillary refill takes less than 2 seconds.  Neurological:     General: No focal deficit present.     Mental Status: He is alert and oriented to person, place, and time.  Psychiatric:        Mood and Affect: Mood normal.        Behavior: Behavior normal.     Results for orders placed or performed in visit on 11/04/24 (from the past 24 hours)  POCT HgB A1C     Status: Abnormal   Collection Time: 11/04/24  8:35 AM  Result Value Ref Range   Hemoglobin A1C     HbA1c POC (<> result, manual entry) 7.2 4.0 - 5.6 %   HbA1c, POC (prediabetic range)     HbA1c, POC (controlled diabetic range)      ASSESSMENT & PLAN: A total of 44 minutes was spent with the patient and counseling/coordination of care regarding preparing for this visit, review of most recent office visit notes, review of multiple chronic medical conditions and their management, cardiovascular risks associated with hypertension and diabetes, review of all medications, review of most recent bloodwork results including interpretation of today's hemoglobin A1c, review of health maintenance items, education on nutrition, prognosis, documentation, and need for follow up.   Problem List Items Addressed This Visit       Cardiovascular and Mediastinum   Hypertension associated with diabetes (HCC) - Primary   BP Readings from Last 3 Encounters:  11/04/24 118/82  07/10/24 116/80  05/29/24 104/78  Well-controlled hypertension Continue amlodipine  10 mg daily Elevated hemoglobin A1c is 7.2 Patient prefers to manage  this with better nutrition Wants to avoid medications at present time Cardiovascular risks associated with hypertension and diabetes discussed Diet and nutrition discussed. Recommend follow-up in 3 months       Relevant Medications   ELIQUIS 5 MG TABS tablet   Other Relevant Orders   POCT HgB A1C (Completed)     Endocrine   Dyslipidemia associated with type 2 diabetes mellitus (HCC)   Hemoglobin A1c today is 7.2 Chronic stable conditions Diet and nutrition discussed Continue rosuvastatin  10 mg daily      Relevant Orders   POCT HgB A1C (Completed)     Other   History of prostate cancer   Successfully treated with radiation Stable.  No concerns.      History of pulmonary embolism   Recent diagnosis Started on Eliquis 5 mg twice a day No clinically significant bleeding episodes Clinically stable. Fall precautions given      Patient Instructions  Mantenimiento de Research Officer, Political Party, Male Adoptar un estilo de vida saludable y recibir atencin preventiva son importantes para promover la salud y counsellor. Consulte al mdico sobre: El esquema adecuado para hacerse pruebas y exmenes peridicos. Cosas que puede hacer por su cuenta para prevenir enfermedades y [place] sano. Qu debo saber sobre la dieta, el peso y el ejercicio? Consuma una dieta saludable  Consuma una dieta que incluya muchas verduras, frutas, productos lcteos con bajo contenido de  grasa y protenas magras. No consuma muchos alimentos ricos en grasas slidas, azcares agregados o sodio. Mantenga un peso saludable El ndice de masa muscular [facility]) es una medida que puede utilizarse para identificar posibles problemas de [place]. Proporciona una estimacin de la grasa corporal basndose en el peso y la altura. Su mdico puede ayudarle a determinar su IMC y a personnel officer o pharmacologist un peso saludable. Haga ejercicio con regularidad Haga ejercicio con regularidad. Esta es una de las  prcticas ms importantes que puede hacer por su salud. La harley-davidson de los adultos deben seguir estas pautas: Education Officer, Environmental, al menos, 150 minutos de actividad fsica por semana. El ejercicio debe aumentar la frecuencia cardaca y media planner transpirar (ejercicio de intensidad moderada). Hacer ejercicios de fortalecimiento por lo [facility] por semana. Agregue esto a su plan de ejercicio de intensidad moderada. Pase menos tiempo sentado. Incluso la actividad fsica ligera puede ser beneficiosa. Controle sus niveles de colesterol y lpidos en la sangre Comience a realizarse anlisis de lpidos y oncologist en la sangre a los 20 aos y luego reptalos cada 5 aos. Es posible que insurance underwriter los niveles de colesterol con mayor frecuencia si: Sus niveles de lpidos y colesterol son altos. Es mayor de 40 aos. Presenta un alto riesgo de padecer enfermedades cardacas. Qu debo saber sobre las pruebas de deteccin del cncer? Muchos tipos de cncer pueden detectarse de manera temprana y, a menudo, pueden prevenirse. Segn su historia clnica y sus antecedentes familiares, es posible que deba realizarse pruebas de deteccin del cncer en diferentes edades. Esto puede incluir pruebas de deteccin de lo siguiente: Building services engineer. Cncer de prstata. Cncer de piel. Cncer de pulmn. Qu debo saber sobre la enfermedad cardaca, la diabetes y la hipertensin arterial? Presin arterial y enfermedad cardaca La hipertensin arterial causa enfermedades cardacas y aumenta el riesgo de accidente cerebrovascular. Es ms probable que esto se manifieste en las personas que tienen lecturas de presin arterial alta o tienen sobrepeso. Hable con el mdico sobre sus valores de presin arterial deseados. Hgase controlar la presin arterial: Cada 3 a 5 aos si tiene entre 18 y 84 aos. Todos los aos si es mayor de 40 aos. Si tiene entre 65 y 37 aos y es fumador o insurance underwriter, pregntele al mdico si debe  realizarse una prueba de deteccin de aneurisma artico abdominal (AAA) por nica vez. Diabetes Realcese exmenes de deteccin de la diabetes con regularidad. Este anlisis revisa el nivel de azcar en la sangre en [place]. Hgase las pruebas de deteccin: Cada tres aos despus de los 45 aos de edad si tiene un peso normal y un bajo riesgo de padecer diabetes. Con ms frecuencia y a partir de [place] edad inferior si tiene sobrepeso o un alto riesgo de padecer diabetes. Qu debo saber sobre la prevencin de infecciones? Hepatitis B Si tiene un riesgo ms alto de contraer hepatitis B, debe someterse a un examen de deteccin de este virus. Hable con el mdico para averiguar si tiene riesgo de contraer la infeccin por hepatitis B. Hepatitis C Se recomienda un anlisis de [place] para: Todos los que nacieron entre 1945 y [phone]. Todas las personas que tengan un riesgo de haber contrado hepatitis C. Enfermedades de transmisin sexual (ETS) Debe realizarse pruebas de deteccin de ITS todos los aos, incluidas la gonorrea y la clamidia, si: Es sexualmente activo y es menor de [address]. Es mayor de [address], y public affairs consultant informa que corre riesgo de tener este tipo de  infecciones. La actividad sexual ha cambiado desde que le hicieron la ltima prueba de deteccin y tiene un riesgo mayor de tener clamidia o copy. Pregntele al mdico si usted tiene riesgo. Pregntele al mdico si usted tiene un alto riesgo de primary school teacher VIH. El mdico tambin puede recomendarle un medicamento recetado para ayudar a evitar la infeccin por el VIH. Si elige tomar medicamentos para prevenir el VIH, primero debe oneok de deteccin del VIH. Luego debe hacerse anlisis cada 3 meses mientras est tomando los medicamentos. Siga estas indicaciones en su casa: Consumo de alcohol No beba alcohol si el mdico se lo prohbe. Si bebe alcohol: Limite la cantidad que consume de 0 a 2 bebidas por da. Sepa cunta cantidad  de alcohol hay en las bebidas que toma. En los [address], una medida equivale a una botella de cerveza de 12 oz (355 ml), un vaso de vino de 5 oz (148 ml) o un vaso de una bebida alcohlica de alta graduacin de 1 oz (44 ml). Estilo de vida No consuma ningn producto que contenga nicotina o tabaco. Estos productos incluyen cigarrillos, tabaco para theatre manager y aparatos de vapeo, como los cigarrillos electrnicos. Si necesita ayuda para dejar de consumir estos productos, consulte al mdico. No consuma drogas. No comparta agujas. Solicite ayuda a su mdico si necesita apoyo o informacin para abandonar las drogas. Indicaciones generales Realcese los estudios de rutina de [address], dentales y de wellsite geologist. Mantngase al da con las vacunas. Infrmele a su mdico si: Se siente deprimido con frecuencia. Alguna vez ha sido vctima de maltrato o no se siente seguro en su casa. Resumen Adoptar un estilo de vida saludable y recibir atencin preventiva son importantes para promover la salud y counsellor. Siga las instrucciones del mdico acerca de una dieta saludable, el ejercicio y la realizacin de pruebas o exmenes para hotel manager. Siga las instrucciones del mdico con respecto al control del colesterol y la presin arterial. Esta informacin no tiene theme park manager el consejo del mdico. Asegrese de hacerle al mdico cualquier pregunta que tenga. Document Revised: 05/19/2021 Document Reviewed: 05/19/2021 Elsevier Patient Education  2024 Elsevier Inc.       Emil Schaumann, MD [facility]

## 2024-11-04 NOTE — Assessment & Plan Note (Signed)
 Recent diagnosis Started on Eliquis 5 mg twice a day No clinically significant bleeding episodes Clinically stable. Fall precautions given

## 2024-11-04 NOTE — Assessment & Plan Note (Signed)
 BP Readings from Last 3 Encounters:  11/04/24 118/82  07/10/24 116/80  05/29/24 104/78  Well-controlled hypertension Continue amlodipine  10 mg daily Elevated hemoglobin A1c is 7.2 Patient prefers to manage this with better nutrition Wants to avoid medications at present time Cardiovascular risks associated with hypertension and diabetes discussed Diet and nutrition discussed. Recommend follow-up in 3 months

## 2024-11-04 NOTE — Assessment & Plan Note (Signed)
 Hemoglobin A1c today is 7.2 Chronic stable conditions Diet and nutrition discussed Continue rosuvastatin  10 mg daily

## 2024-11-08 LAB — OPHTHALMOLOGY REPORT-SCANNED

## 2024-12-10 ENCOUNTER — Ambulatory Visit: Payer: Self-pay

## 2024-12-10 ENCOUNTER — Emergency Department (HOSPITAL_COMMUNITY)
Admission: EM | Admit: 2024-12-10 | Discharge: 2024-12-10 | Disposition: A | Discharge status: Home / Self Care | Source: Ambulatory Visit | Attending: Emergency Medicine | Admitting: Emergency Medicine

## 2024-12-10 ENCOUNTER — Emergency Department (HOSPITAL_COMMUNITY)

## 2024-12-10 ENCOUNTER — Other Ambulatory Visit: Payer: Self-pay

## 2024-12-10 DIAGNOSIS — R10A0 Flank pain, unspecified side: Secondary | ICD-10-CM | POA: Diagnosis present

## 2024-12-10 DIAGNOSIS — J45909 Unspecified asthma, uncomplicated: Secondary | ICD-10-CM | POA: Insufficient documentation

## 2024-12-10 DIAGNOSIS — Z7901 Long term (current) use of anticoagulants: Secondary | ICD-10-CM | POA: Insufficient documentation

## 2024-12-10 DIAGNOSIS — N1330 Unspecified hydronephrosis: Secondary | ICD-10-CM

## 2024-12-10 DIAGNOSIS — I10 Essential (primary) hypertension: Secondary | ICD-10-CM | POA: Insufficient documentation

## 2024-12-10 DIAGNOSIS — R339 Retention of urine, unspecified: Secondary | ICD-10-CM | POA: Diagnosis not present

## 2024-12-10 DIAGNOSIS — Z8546 Personal history of malignant neoplasm of prostate: Secondary | ICD-10-CM | POA: Insufficient documentation

## 2024-12-10 DIAGNOSIS — Z79899 Other long term (current) drug therapy: Secondary | ICD-10-CM | POA: Diagnosis not present

## 2024-12-10 LAB — BASIC METABOLIC PANEL WITH GFR
Anion gap: 11 (ref 5–15)
BUN: 11 mg/dL (ref 8–23)
CO2: 24 mmol/L (ref 22–32)
Calcium: 9.3 mg/dL (ref 8.9–10.3)
Chloride: 106 mmol/L (ref 98–111)
Creatinine, Ser: 0.99 mg/dL (ref 0.61–1.24)
GFR, Estimated: >60 mL/min (ref >60)
Glucose, Bld: 126 mg/dL — ABNORMAL HIGH (ref 70–99)
Potassium: 3.7 mmol/L (ref 3.5–5.1)
Sodium: 141 mmol/L (ref 135–145)

## 2024-12-10 LAB — URINALYSIS, ROUTINE W REFLEX MICROSCOPIC
Bacteria, UA: NONE SEEN
Bilirubin Urine: NEGATIVE
Glucose, UA: NEGATIVE mg/dL
Ketones, ur: 5 mg/dL — AB
Leukocytes,Ua: NEGATIVE
Nitrite: NEGATIVE
Protein, ur: 30 mg/dL — AB
Specific Gravity, Urine: 1.012 (ref 1.005–1.030)
pH: 6 (ref 5.0–8.0)

## 2024-12-10 LAB — CBC
HCT: 44.8 % (ref 39.0–52.0)
Hemoglobin: 15.4 g/dL (ref 13.0–17.0)
MCH: 30.1 pg (ref 26.0–34.0)
MCHC: 34.4 g/dL (ref 30.0–36.0)
MCV: 87.5 fL (ref 80.0–100.0)
Platelets: 222 K/uL (ref 150–400)
RBC: 5.12 MIL/uL (ref 4.22–5.81)
RDW: 13 % (ref 11.5–15.5)
WBC: 5.8 K/uL (ref 4.0–10.5)
nRBC: 0 % (ref 0.0–0.2)

## 2024-12-10 MED ORDER — ONDANSETRON HCL 4 MG/2ML IJ SOLN
4.0000 mg | Freq: Once | INTRAMUSCULAR | Status: AC
  Administered 2024-12-10: 13:00:00 4 mg via INTRAVENOUS
  Filled 2024-12-10: qty 2

## 2024-12-10 MED ORDER — HYDROMORPHONE HCL 1 MG/ML IJ SOLN
1.0000 mg | Freq: Once | INTRAMUSCULAR | Status: AC
  Administered 2024-12-10: 13:00:00 1 mg via INTRAVENOUS
  Filled 2024-12-10: qty 1

## 2024-12-10 NOTE — Telephone Encounter (Signed)
° ° °  Copied from CRM [id number]. Topic: Clinical - Red Word Triage >> Dec 10, 2024  9:16 AM Treva T wrote: [facility] that prompted transfer to Nurse Triage: Pt calling, reports he woke up this morning with extreme lower back pain.  Also reports he has had history of kidney stones.  Pt is requesting an appt to be seen today.   Ph. [phone] >> Dec 10, 2024  9:29 AM Treva T wrote: Pt holding for NT, call disconnected, attempted to contact pt back, but no response. Requesting a follow call to pt. For extreme worsening back pain.  Ph. [phone]

## 2024-12-10 NOTE — ED Triage Notes (Signed)
 PT arrives to triage with complaints of RIGHT flank pain that began this morning. Pt reports a hx of kidney stones, states that this feels similar. PT went to [facility] and was sent to the ER.

## 2024-12-10 NOTE — Telephone Encounter (Signed)
 Patient arrived at ED 12:10 today.

## 2024-12-10 NOTE — ED Provider Triage Note (Signed)
 Emergency Medicine Provider Triage Evaluation Note  Rickey Cruz , a 63 y.o. male  was evaluated in triage.  Pt complains of severe flank pain.  Patient has a history of kidney stones.  He was seen at [facility] urgent [facility] and sent here for evaluation  Review of Systems  Positive: Severe flank pain Negative: Fever  Physical Exam  BP 130/89 (BP Location: Left Arm)   Pulse (!) 56   Temp 98 F (36.7 C) (Oral)   Resp 16   Ht 5' 5 (1.651 m)   Wt 83.5 kg   SpO2 100%   BMI 30.62 kg/m  Gen:   Awake, uncomfortable Resp:  Normal effort  MSK:   Moves extremities without difficulty  Other:    Medical Decision Making  Medically screening exam initiated at 12:27 PM.  Appropriate orders placed.  ZYREN SEVIGNY was informed that the remainder of the evaluation will be completed by another provider, this initial triage assessment does not replace that evaluation, and the importance of remaining in the ED until their evaluation is complete.  Pain medicine ordered, CT renal ordered.   Flint Sonny POUR, PA-C 12/10/24 1406

## 2024-12-10 NOTE — Consult Note (Signed)
 H&P  Chief Complaint: urinary retention  History of Present Illness: 63 YO M with urinary retention. Has h/o prostate cancer s/p SBRT at [facility] but he states he completed around June.  He came in today with inability to void.  He was voiding small amounts and frequently.  CT scan was performed that showed significantly distended bladder with bilateral hydronephrosis.  Creatinine was normal.  Past Medical History:  Diagnosis Date   Allergy    Flonase , Patanol, Zyrtec    Arthritis    psoriasis   Asthma    seasonal, with allergies   Borderline diabetes    Cluster headache    Colon polyp 10/27/2011   Depression    denies 11/11/16; denies 11/21/18 been years   Eczema    GERD (gastroesophageal reflux disease)    History of kidney stones    x1   Hyperlipidemia    Hypertension    Low back pain    in kidney area-was told that it was related to gallstones   Prediabetes    Prostate cancer (HCC)    Psoriasis    [name]   Severe sleep apnea    h/o; had procedure and weight control, does not have to wear CPAP   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 Repeat 5 years.   CYSTOSCOPY WITH RETROGRADE PYELOGRAM, URETEROSCOPY AND STENT PLACEMENT Left 05/22/2022   Procedure: CYSTOSCOPY WITH RETROGRADE PYELOGRAM, URETEROSCOPY AND STENT PLACEMENT; TRANSURETHRAL RESECTION OF PROSTATE;  Surgeon: Selma Donnice SAUNDERS, MD;  Location: [facility];  Service: Urology;  Laterality: Left;   CYSTOSCOPY WITH RETROGRADE PYELOGRAM, URETEROSCOPY AND STENT PLACEMENT Left 06/07/2022   Procedure: CYSTOSCOPY WITH RETROGRADE PYELOGRAM, URETEROSCOPY AND STENT EXCHANGE;  Surgeon: Elisabeth Valli BIRCH, MD;  Location: [facility];  Service: Urology;  Laterality: Left;  90 MINS   CYSTOSCOPY/URETEROSCOPY/HOLMIUM LASER/STENT PLACEMENT Left 04/27/2023   Procedure: CYSTOSCOPY LEFT URETEROSCOPY, STONE EXTRACTION, LEFT URETERAL STENT PLACEMENT;  Surgeon: Cam Morene ORN, MD;  Location: [facility];  Service: Urology;  Laterality: Left;   EXTRACORPOREAL SHOCK WAVE LITHOTRIPSY Left 05/09/2022   Procedure: EXTRACORPOREAL SHOCK WAVE LITHOTRIPSY (ESWL);  Surgeon: Alvaro Hummer, MD;  Location: [facility];  Service: Urology;  Laterality: Left;   HOLMIUM LASER APPLICATION Left 06/07/2022   Procedure: HOLMIUM LASER APPLICATION;  Surgeon: Elisabeth Valli BIRCH, MD;  Location: [facility];  Service: Urology;  Laterality: Left;   POLYPECTOMY     PROSTATE BIOPSY  10/2014   will have another in 3 months   SEPTOPLASTY  2014   TONSILLECTOMY  2014   TRANSURETHRAL RESECTION OF PROSTATE N/A 06/07/2022   Procedure: TRANSURETHRAL RESECTION OF THE PROSTATE (TURP);  Surgeon: Elisabeth Valli BIRCH, MD;  Location: [facility];  Service: Urology;  Laterality: N/A;   UVULOPALATOPHARYNGOPLASTY  2014    Home Medications:  (Not in a hospital admission)  Allergies: Allergies[1]  Family History  Problem Relation Age of Onset   Stroke Mother    Asthma Sister    Colon cancer Neg Hx    Esophageal cancer Neg Hx    Stomach cancer Neg Hx    Prostate cancer Neg Hx    Diabetes Neg Hx    CAD Neg  Hx    Migraines Neg Hx    Headache Neg Hx    Social History:  reports that he has never smoked. He has never used smokeless tobacco. He reports that he does not drink alcohol and does not use drugs.  ROS: A complete review of systems was performed.  All systems are negative except for pertinent findings as noted. ROS   Physical Exam:  Vital signs in last 24 hours: Temp:  [97.7 F (36.5 C)-98 F (36.7 C)] 97.7 F (36.5 C) (12/16 1545) Pulse Rate:  [56-61] 61 (12/16 1545) Resp:  [14-16] 14 (12/16 1545) BP:  (117-130)/(80-89) 117/80 (12/16 1545) SpO2:  [98 %-100 %] 98 % (12/16 1545) Weight:  [83.5 kg] 83.5 kg (12/16 1216) General:  Alert and oriented, No acute distress HEENT: Normocephalic, atraumatic Neck: No JVD or lymphadenopathy Cardiovascular: Regular rate and rhythm Lungs: Regular rate and effort Abdomen: Soft, nontender, nondistended, no abdominal masses Back: No CVA tenderness Genitourinary: Uncircumcised phallus.  No phimosis. Extremities: No edema Neurologic: Grossly intact  Laboratory Data:  Results for orders placed or performed during the hospital encounter of 12/10/24 (from the past 24 hours)  Basic metabolic panel     Status: Abnormal   Collection Time: 12/10/24 12:30 PM  Result Value Ref Range   Sodium 141 135 - 145 mmol/L   Potassium 3.7 3.5 - 5.1 mmol/L   Chloride 106 98 - 111 mmol/L   CO2 24 22 - 32 mmol/L   Glucose, Bld 126 (H) 70 - 99 mg/dL   BUN 11 8 - 23 mg/dL   Creatinine, Ser 9.00 0.61 - 1.24 mg/dL   Calcium  9.3 8.9 - 10.3 mg/dL   GFR, Estimated >39 >39 mL/min   Anion gap 11 5 - 15  CBC     Status: None   Collection Time: 12/10/24 12:30 PM  Result Value Ref Range   WBC 5.8 4.0 - 10.5 K/uL   RBC 5.12 4.22 - 5.81 MIL/uL   Hemoglobin 15.4 13.0 - 17.0 g/dL   HCT 55.1 60.9 - 47.9 %   MCV 87.5 80.0 - 100.0 fL   MCH 30.1 26.0 - 34.0 pg   MCHC 34.4 30.0 - 36.0 g/dL   RDW 86.9 88.4 - 84.4 %   Platelets 222 150 - 400 K/uL   nRBC 0.0 0.0 - 0.2 %  Urinalysis, Routine w reflex microscopic -Urine, Clean Catch     Status: Abnormal   Collection Time: 12/10/24  6:21 PM  Result Value Ref Range   Color, Urine YELLOW YELLOW   APPearance CLEAR CLEAR   Specific Gravity, Urine 1.012 1.005 - 1.030   pH 6.0 5.0 - 8.0   Glucose, UA NEGATIVE NEGATIVE mg/dL   Hgb urine dipstick MODERATE (A) NEGATIVE   Bilirubin Urine NEGATIVE NEGATIVE   Ketones, ur 5 (A) NEGATIVE mg/dL   Protein, ur 30 (A) NEGATIVE mg/dL   Nitrite NEGATIVE NEGATIVE   Leukocytes,Ua NEGATIVE NEGATIVE    RBC / HPF 11-20 0 - 5 RBC/hpf   WBC, UA 0-5 0 - 5 WBC/hpf   Bacteria, UA NONE SEEN NONE SEEN   Squamous Epithelial / HPF 0-5 0 - 5 /HPF   Mucus PRESENT    No results found for this or any previous visit (from the past 240 hours). Creatinine: Recent Labs    12/10/24 1230  CREATININE 0.99   Procedure: Simple Foley catheterization I prepped the penis and advanced an 17 French coud catheter into the bladder without any issue.  There was  return of clear yellow urine.  I attached the catheter to the right leg.  Impression/Assessment:  Urinary retention secondary to BPH History of prostate cancer  Plan:  Keep Foley catheter for about 1 week.  He should undergo a voiding trial after about 1 week.  However, I discussed possible need for intervention.  Given his history of radiation and massively enlarged prostate would recommend strong consideration for prostate embolization.  This would carry the lowest risk of complications such as urinary incontinence.  Not a good candidate for Aquablation given his prostate cancer and radiation history.  May be a candidate for TURP of his median lobe but addressing the lateral lobes may lead to higher risk of incontinence.  We will discuss prostate embolization at follow-up.  Sherwood JONETTA Edison, III 12/10/2024, 8:05 PM      [1] No Known Allergies

## 2024-12-10 NOTE — Discharge Instructions (Signed)
 Return if any problems.

## 2024-12-14 ENCOUNTER — Other Ambulatory Visit: Payer: Self-pay

## 2024-12-14 ENCOUNTER — Emergency Department (HOSPITAL_COMMUNITY)
Admission: EM | Admit: 2024-12-14 | Discharge: 2024-12-14 | Disposition: A | Discharge status: Home / Self Care | Attending: Emergency Medicine | Admitting: Emergency Medicine

## 2024-12-14 DIAGNOSIS — N3090 Cystitis, unspecified without hematuria: Secondary | ICD-10-CM | POA: Insufficient documentation

## 2024-12-14 DIAGNOSIS — R300 Dysuria: Secondary | ICD-10-CM | POA: Diagnosis present

## 2024-12-14 DIAGNOSIS — Z7901 Long term (current) use of anticoagulants: Secondary | ICD-10-CM | POA: Diagnosis not present

## 2024-12-14 LAB — URINALYSIS, ROUTINE W REFLEX MICROSCOPIC
Bacteria, UA: NONE SEEN
Bilirubin Urine: NEGATIVE
Glucose, UA: NEGATIVE mg/dL
Ketones, ur: 20 mg/dL — AB
Nitrite: NEGATIVE
Protein, ur: >=300 mg/dL — AB
RBC / HPF: >50 RBC/hpf (ref 0–5)
Specific Gravity, Urine: 1.017 (ref 1.005–1.030)
pH: 5 (ref 5.0–8.0)

## 2024-12-14 MED ORDER — STERILE WATER FOR INJECTION IJ SOLN
INTRAMUSCULAR | Status: AC
  Administered 2024-12-14: 2.1 mL
  Filled 2024-12-14: qty 10

## 2024-12-14 MED ORDER — PHENAZOPYRIDINE HCL 200 MG PO TABS: 200.0000 mg | ORAL_TABLET | Freq: Three times a day (TID) | ORAL | 0 refills | Status: AC | PRN

## 2024-12-14 MED ORDER — CEFTRIAXONE SODIUM 1 G IJ SOLR
1.0000 g | Freq: Once | INTRAMUSCULAR | Status: AC
  Administered 2024-12-14: 1 g via INTRAMUSCULAR
  Filled 2024-12-14: qty 10

## 2024-12-14 MED ORDER — CEPHALEXIN 500 MG PO CAPS: 500.0000 mg | ORAL_CAPSULE | Freq: Four times a day (QID) | ORAL | 0 refills | Status: AC

## 2024-12-14 NOTE — ED Triage Notes (Signed)
 Pt arrives with Foley catheter in place since 12/16 c/o burning and pain

## 2024-12-14 NOTE — ED Notes (Signed)
 Called lab to request add-on urine culture as ordered.

## 2024-12-14 NOTE — Discharge Instructions (Signed)
Follow up Monday as planned.

## 2024-12-15 LAB — URINE CULTURE: Culture: NO GROWTH

## 2024-12-16 NOTE — ED Provider Notes (Signed)
 " [place] EMERGENCY DEPARTMENT AT [facility] Provider Note   CSN: [id number] Arrival date & time: 12/14/24  1043     Patient presents with: Dysuria   Rickey Cruz is a 63 y.o. male.   Patient is a urinary catheter and complains of burning.  He is to see the doctor Monday to have the catheter out  The history is provided by the patient and medical records.  Dysuria Presenting symptoms: dysuria   Context: not after injury   Relieved by:  Nothing Worsened by:  Nothing Ineffective treatments:  None tried Associated symptoms: no abdominal pain, no diarrhea, no hematuria and no urinary frequency   Risk factors: no bladder surgery        Prior to Admission medications  Medication Sig Start Date End Date Taking? Authorizing Provider  cephALEXin  (KEFLEX ) 500 MG capsule Take 1 capsule (500 mg total) by mouth 4 (four) times daily. 12/14/24  Yes Suzette Pac, MD  phenazopyridine  (PYRIDIUM ) 200 MG tablet Take 1 tablet (200 mg total) by mouth 3 (three) times daily as needed for pain (burining). 12/14/24  Yes Amair Shrout, MD  amLODipine  (NORVASC ) 10 MG tablet TAKE 1 TABLET(10 MG) BY MOUTH DAILY 03/05/24   Sagardia, Emil Schanz, MD  ELIQUIS 5 MG TABS tablet Take 5 mg by mouth 2 (two) times daily.    [provider]  finasteride  (PROSCAR ) 5 MG tablet Take 5 mg by mouth daily. Patient not taking: Reported on 11/04/2024 01/15/23   [provider]  fluocinonide  cream (LIDEX ) 0.05 % APPLY TOPICALLY TO THE AFFECTED AREA TWICE DAILY 03/05/24   Purcell Emil Schanz, MD  fluticasone  (FLONASE ) 50 MCG/ACT nasal spray Use two sprays in each nostril once daily as directed. 01/10/24   Kozlow, Camellia PARAS, MD  HYDROcodone  bit-homatropine (HYCODAN) 5-1.5 MG/5ML syrup Take 5 mLs by mouth at bedtime as needed for cough. 05/29/24   Purcell Emil Schanz, MD  ketoconazole  (NIZORAL ) 2 % shampoo Apply 1 Application topically as needed. 01/04/23   [provider]  montelukast   (SINGULAIR ) 10 MG tablet Take 10 mg by mouth at bedtime. Patient not taking: Reported on 11/04/2024    [provider]  omeprazole  (PRILOSEC) 20 MG capsule Take 1 capsule (20 mg total) by mouth daily. Patient not taking: Reported on 11/04/2024 01/10/24   Kozlow, Eric J, MD  rosuvastatin  (CRESTOR ) 10 MG tablet TAKE 1 TABLET(10 MG) BY MOUTH DAILY 08/29/24   Sagardia, Miguel Jose, MD  tamsulosin  (FLOMAX ) 0.4 MG CAPS capsule Take 0.8 mg by mouth. 04/26/24   [provider]  Vitamin D , Ergocalciferol , (DRISDOL ) 1.25 MG (50000 UNIT) CAPS capsule Take 1 capsule (50,000 Units total) by mouth every 7 (seven) days. 05/02/24   Sagardia, Miguel Jose, MD  VTAMA 1 % CREA Apply topically daily.    [provider]    Allergies: Patient has no known allergies.    Review of Systems  Constitutional:  Negative for appetite change and fatigue.  HENT:  Negative for congestion, ear discharge and sinus pressure.   Eyes:  Negative for discharge.  Respiratory:  Negative for cough.   Cardiovascular:  Negative for chest pain.  Gastrointestinal:  Negative for abdominal pain and diarrhea.  Genitourinary:  Positive for dysuria. Negative for frequency and hematuria.  Musculoskeletal:  Negative for back pain.  Skin:  Negative for rash.  Neurological:  Negative for seizures and headaches.  Psychiatric/Behavioral:  Negative for hallucinations.     Updated Vital Signs BP (!) 129/91 (BP Location: Right  Arm)   Pulse 72   Temp 98.2 F (36.8 C) (Oral)   Resp 18   Ht 5' 5 (1.651 m)   Wt 83.9 kg   SpO2 100%   BMI 30.79 kg/m   Physical Exam Vitals and nursing note reviewed.  Constitutional:      Appearance: He is well-developed.  HENT:     Head: Normocephalic.     Nose: Nose normal.  Eyes:     General: No scleral icterus.    Conjunctiva/sclera: Conjunctivae normal.  Neck:     Thyroid: No thyromegaly.  Cardiovascular:     Rate and Rhythm: Normal rate and regular rhythm.     Heart sounds:  No murmur heard.    No friction rub. No gallop.  Pulmonary:     Breath sounds: No stridor. No wheezing or rales.  Chest:     Chest wall: No tenderness.  Abdominal:     General: There is no distension.     Tenderness: There is no abdominal tenderness. There is no rebound.  Genitourinary:    Comments: Foley catheter in place Musculoskeletal:        General: Normal range of motion.     Cervical back: Neck supple.  Lymphadenopathy:     Cervical: No cervical adenopathy.  Skin:    Findings: No erythema or rash.  Neurological:     Mental Status: He is alert and oriented to person, place, and time.     Motor: No abnormal muscle tone.     Coordination: Coordination normal.  Psychiatric:        Behavior: Behavior normal.     (all labs ordered are listed, but only abnormal results are displayed) Labs Reviewed  URINALYSIS, ROUTINE W REFLEX MICROSCOPIC - Abnormal; Notable for the following components:      Result Value   APPearance HAZY (*)    Hgb urine dipstick LARGE (*)    Ketones, ur 20 (*)    Protein, ur >=300 (*)    Leukocytes,Ua SMALL (*)    All other components within normal limits  URINE CULTURE    EKG: None  Radiology: No results found.   Procedures   Medications Ordered in the ED  cefTRIAXone  (ROCEPHIN ) injection 1 g (1 g Intramuscular Given 12/14/24 1342)  sterile water  (preservative free) injection (2.1 mLs  Given 12/14/24 1342)                                    Medical Decision Making Amount and/or Complexity of Data Reviewed Labs: ordered.  Risk Prescription drug management.   Patient with urinary tract infection.  Cultures have been done and he is started on Keflex  and will follow-up with his urologist Monday     Final diagnoses:  Cystitis    ED Discharge Orders          Ordered    cephALEXin  (KEFLEX ) 500 MG capsule  4 times daily        12/14/24 1333    phenazopyridine  (PYRIDIUM ) 200 MG tablet  3 times daily PRN        12/14/24 1333                Suzette Pac, MD 12/16/24 [phone]  "

## 2024-12-31 NOTE — ED Provider Notes (Signed)
 " [place] EMERGENCY DEPARTMENT AT [facility] Provider Note   CSN: [id number] Arrival date & time: 12/10/24  1210     Patient presents with: Flank Pain   Rickey Cruz is a 64 y.o. male.   Pt complains of difficulty urinating.  Pt reports he has had similar discomfort with kidney stones in the past.  Pt denies fever or chills.    The history is provided by the patient.  Flank Pain       Prior to Admission medications  Medication Sig Start Date End Date Taking? Authorizing Provider  amLODipine  (NORVASC ) 10 MG tablet TAKE 1 TABLET(10 MG) BY MOUTH DAILY 03/05/24   Purcell Emil Schanz, MD  cephALEXin  (KEFLEX ) 500 MG capsule Take 1 capsule (500 mg total) by mouth 4 (four) times daily. 12/14/24   Suzette Pac, MD  ELIQUIS 5 MG TABS tablet Take 5 mg by mouth 2 (two) times daily.    [provider]  finasteride  (PROSCAR ) 5 MG tablet Take 5 mg by mouth daily. Patient not taking: Reported on 11/04/2024 01/15/23   [provider]  fluocinonide  cream (LIDEX ) 0.05 % APPLY TOPICALLY TO THE AFFECTED AREA TWICE DAILY 03/05/24   Purcell Emil Schanz, MD  fluticasone  (FLONASE ) 50 MCG/ACT nasal spray Use two sprays in each nostril once daily as directed. 01/10/24   Kozlow, Camellia PARAS, MD  HYDROcodone  bit-homatropine (HYCODAN) 5-1.5 MG/5ML syrup Take 5 mLs by mouth at bedtime as needed for cough. 05/29/24   Purcell Emil Schanz, MD  ketoconazole  (NIZORAL ) 2 % shampoo Apply 1 Application topically as needed. 01/04/23   [provider]  montelukast  (SINGULAIR ) 10 MG tablet Take 10 mg by mouth at bedtime. Patient not taking: Reported on 11/04/2024    [provider]  omeprazole  (PRILOSEC) 20 MG capsule Take 1 capsule (20 mg total) by mouth daily. Patient not taking: Reported on 11/04/2024 01/10/24   Kozlow, Eric J, MD  phenazopyridine  (PYRIDIUM ) 200 MG tablet Take 1 tablet (200 mg total) by mouth 3 (three) times daily as needed for pain (burining). 12/14/24    Suzette Pac, MD  rosuvastatin  (CRESTOR ) 10 MG tablet TAKE 1 TABLET(10 MG) BY MOUTH DAILY 08/29/24   Purcell Emil Schanz, MD  tamsulosin  (FLOMAX ) 0.4 MG CAPS capsule Take 0.8 mg by mouth. 04/26/24   [provider]  Vitamin D , Ergocalciferol , (DRISDOL ) 1.25 MG (50000 UNIT) CAPS capsule Take 1 capsule (50,000 Units total) by mouth every 7 (seven) days. 05/02/24   Sagardia, Miguel Jose, MD  VTAMA 1 % CREA Apply topically daily.    [provider]    Allergies: Patient has no known allergies.    Review of Systems  Genitourinary:  Positive for flank pain.  All other systems reviewed and are negative.   Updated Vital Signs BP 117/80 (BP Location: Left Arm)   Pulse 61   Temp 97.7 F (36.5 C) (Oral)   Resp 14   Ht 5' 5 (1.651 m)   Wt 83.5 kg   SpO2 98%   BMI 30.62 kg/m   Physical Exam Vitals and nursing note reviewed.  Constitutional:      Appearance: He is well-developed.  HENT:     Head: Normocephalic.  Cardiovascular:     Rate and Rhythm: Normal rate.  Pulmonary:     Effort: Pulmonary effort is normal.  Abdominal:     General: There is no distension.  Musculoskeletal:        General: Normal range of motion.  Cervical back: Normal range of motion.  Skin:    General: Skin is warm.  Neurological:     General: No focal deficit present.     Mental Status: He is alert and oriented to person, place, and time.     (all labs ordered are listed, but only abnormal results are displayed) Labs Reviewed  URINALYSIS, ROUTINE W REFLEX MICROSCOPIC - Abnormal; Notable for the following components:      Result Value   Hgb urine dipstick MODERATE (*)    Ketones, ur 5 (*)    Protein, ur 30 (*)    All other components within normal limits  BASIC METABOLIC PANEL WITH GFR - Abnormal; Notable for the following components:   Glucose, Bld 126 (*)    All other components within normal limits  CBC    EKG: None  Radiology: No results found.   Procedures    Medications Ordered in the ED  HYDROmorphone  (DILAUDID ) injection 1 mg (1 mg Intravenous Given 12/10/24 1234)  ondansetron  (ZOFRAN ) injection 4 mg (4 mg Intravenous Given 12/10/24 1232)                                    Medical Decision Making Pt complains of abdominal and flank pain.    Amount and/or Complexity of Data Reviewed Labs: ordered. Decision-making details documented in ED Course.    Details: Ua no sign of infection Radiology: ordered.    Details: Ct renal shows enlarged prostate, hydronephrosis   Risk Prescription drug management. Risk Details: Foley placed,  Pt counseled on need to follow up with urology for evaluation.  Pt given rx for flomax .  Pt advised to return if symptoms worsen or change.         Final diagnoses:  Urinary retention  Hydronephrosis, unspecified hydronephrosis type    ED Discharge Orders     None       An After Visit Summary was printed and given to the patient.    Flint Sonny POUR, PA-C 12/31/24 1708  "

## 2025-01-21 ENCOUNTER — Ambulatory Visit: Admitting: Gastroenterology

## 2025-01-21 ENCOUNTER — Inpatient Hospital Stay (HOSPITAL_COMMUNITY)
Admission: EM | Admit: 2025-01-21 | Disposition: A | Source: Home / Self Care | Attending: Family Medicine | Admitting: Family Medicine

## 2025-01-21 ENCOUNTER — Ambulatory Visit
Admission: RE | Admit: 2025-01-21 | Discharge: 2025-01-21 | Disposition: A | Source: Ambulatory Visit | Attending: Gastroenterology

## 2025-01-21 ENCOUNTER — Other Ambulatory Visit: Payer: Self-pay

## 2025-01-21 ENCOUNTER — Emergency Department (HOSPITAL_COMMUNITY)

## 2025-01-21 ENCOUNTER — Other Ambulatory Visit

## 2025-01-21 ENCOUNTER — Encounter: Payer: Self-pay | Admitting: Gastroenterology

## 2025-01-21 ENCOUNTER — Encounter (HOSPITAL_COMMUNITY): Payer: Self-pay

## 2025-01-21 ENCOUNTER — Other Ambulatory Visit: Payer: Self-pay | Admitting: Emergency Medicine

## 2025-01-21 ENCOUNTER — Ambulatory Visit: Payer: Self-pay | Admitting: Gastroenterology

## 2025-01-21 ENCOUNTER — Telehealth: Payer: Self-pay | Admitting: Gastroenterology

## 2025-01-21 VITALS — BP 110/78 | HR 67 | Ht 65.0 in | Wt 184.0 lb

## 2025-01-21 DIAGNOSIS — E119 Type 2 diabetes mellitus without complications: Secondary | ICD-10-CM

## 2025-01-21 DIAGNOSIS — Z8719 Personal history of other diseases of the digestive system: Secondary | ICD-10-CM

## 2025-01-21 DIAGNOSIS — C61 Malignant neoplasm of prostate: Secondary | ICD-10-CM | POA: Diagnosis not present

## 2025-01-21 DIAGNOSIS — K219 Gastro-esophageal reflux disease without esophagitis: Secondary | ICD-10-CM

## 2025-01-21 DIAGNOSIS — N320 Bladder-neck obstruction: Secondary | ICD-10-CM

## 2025-01-21 DIAGNOSIS — R7303 Prediabetes: Secondary | ICD-10-CM | POA: Diagnosis present

## 2025-01-21 DIAGNOSIS — N179 Acute kidney failure, unspecified: Principal | ICD-10-CM | POA: Diagnosis present

## 2025-01-21 DIAGNOSIS — R194 Change in bowel habit: Secondary | ICD-10-CM

## 2025-01-21 DIAGNOSIS — R140 Abdominal distension (gaseous): Secondary | ICD-10-CM

## 2025-01-21 DIAGNOSIS — R338 Other retention of urine: Secondary | ICD-10-CM

## 2025-01-21 DIAGNOSIS — K5900 Constipation, unspecified: Secondary | ICD-10-CM | POA: Diagnosis not present

## 2025-01-21 DIAGNOSIS — Z8546 Personal history of malignant neoplasm of prostate: Secondary | ICD-10-CM

## 2025-01-21 DIAGNOSIS — R1031 Right lower quadrant pain: Secondary | ICD-10-CM

## 2025-01-21 DIAGNOSIS — N400 Enlarged prostate without lower urinary tract symptoms: Secondary | ICD-10-CM

## 2025-01-21 DIAGNOSIS — I700 Atherosclerosis of aorta: Secondary | ICD-10-CM | POA: Insufficient documentation

## 2025-01-21 DIAGNOSIS — Z923 Personal history of irradiation: Secondary | ICD-10-CM

## 2025-01-21 DIAGNOSIS — N1330 Unspecified hydronephrosis: Secondary | ICD-10-CM | POA: Insufficient documentation

## 2025-01-21 DIAGNOSIS — I152 Hypertension secondary to endocrine disorders: Secondary | ICD-10-CM

## 2025-01-21 DIAGNOSIS — R319 Hematuria, unspecified: Secondary | ICD-10-CM | POA: Insufficient documentation

## 2025-01-21 LAB — CBC WITH DIFFERENTIAL/PLATELET
Basophils Absolute: 0 10*3/uL (ref 0.0–0.1)
Basophils Relative: 0.6 % (ref 0.0–3.0)
Eosinophils Absolute: 0.1 10*3/uL (ref 0.0–0.7)
Eosinophils Relative: 1.2 % (ref 0.0–5.0)
HCT: 40.3 % (ref 39.0–52.0)
Hemoglobin: 14.1 g/dL (ref 13.0–17.0)
Lymphocytes Relative: 14.2 % (ref 12.0–46.0)
Lymphs Abs: 0.6 10*3/uL — ABNORMAL LOW (ref 0.7–4.0)
MCHC: 34.8 g/dL (ref 30.0–36.0)
MCV: 86.8 fl (ref 78.0–100.0)
Monocytes Absolute: 0.4 10*3/uL (ref 0.1–1.0)
Monocytes Relative: 9.4 % (ref 3.0–12.0)
Neutro Abs: 3.1 10*3/uL (ref 1.4–7.7)
Neutrophils Relative %: 74.6 % (ref 43.0–77.0)
Platelets: 181 10*3/uL (ref 150.0–400.0)
RBC: 4.65 Mil/uL (ref 4.22–5.81)
RDW: 13.5 % (ref 11.5–15.5)
WBC: 4.2 10*3/uL (ref 4.0–10.5)

## 2025-01-21 LAB — COMPREHENSIVE METABOLIC PANEL WITH GFR
ALT: 28 U/L (ref 3–53)
ALT: 33 U/L (ref 0–44)
AST: 24 U/L (ref 5–37)
AST: 30 U/L (ref 15–41)
Albumin: 4.5 g/dL (ref 3.5–5.2)
Albumin: 4.4 g/dL (ref 3.5–5.0)
Alkaline Phosphatase: 56 U/L (ref 39–117)
Alkaline Phosphatase: 63 U/L (ref 38–126)
Anion gap: 12 (ref 5–15)
BUN: 51 mg/dL — ABNORMAL HIGH (ref 6–23)
BUN: 50 mg/dL — ABNORMAL HIGH (ref 8–23)
CO2: 26 meq/L (ref 19–32)
CO2: 24 mmol/L (ref 22–32)
Calcium: 9.3 mg/dL (ref 8.4–10.5)
Calcium: 9.3 mg/dL (ref 8.9–10.3)
Chloride: 104 meq/L (ref 96–112)
Chloride: 102 mmol/L (ref 98–111)
Creatinine, Ser: 5.05 mg/dL (ref 0.40–1.50)
Creatinine, Ser: 4.98 mg/dL — ABNORMAL HIGH (ref 0.61–1.24)
GFR, Estimated: 12 mL/min — ABNORMAL LOW
GFR: 11.47 mL/min — CL
Glucose, Bld: 107 mg/dL — ABNORMAL HIGH (ref 70–99)
Glucose, Bld: 240 mg/dL — ABNORMAL HIGH (ref 70–99)
Potassium: 4.1 meq/L (ref 3.5–5.1)
Potassium: 4.1 mmol/L (ref 3.5–5.1)
Sodium: 141 meq/L (ref 135–145)
Sodium: 139 mmol/L (ref 135–145)
Total Bilirubin: 0.5 mg/dL (ref 0.2–1.2)
Total Bilirubin: 0.4 mg/dL (ref 0.0–1.2)
Total Protein: 7.6 g/dL (ref 6.0–8.3)
Total Protein: 7.2 g/dL (ref 6.5–8.1)

## 2025-01-21 LAB — CBC
HCT: 38.8 % — ABNORMAL LOW (ref 39.0–52.0)
Hemoglobin: 13.5 g/dL (ref 13.0–17.0)
MCH: 30.3 pg (ref 26.0–34.0)
MCHC: 34.8 g/dL (ref 30.0–36.0)
MCV: 87.2 fL (ref 80.0–100.0)
Platelets: 165 10*3/uL (ref 150–400)
RBC: 4.45 MIL/uL (ref 4.22–5.81)
RDW: 12.1 % (ref 11.5–15.5)
WBC: 4.4 10*3/uL (ref 4.0–10.5)
nRBC: 0 % (ref 0.0–0.2)

## 2025-01-21 LAB — URINALYSIS, ROUTINE W REFLEX MICROSCOPIC
Bilirubin Urine: NEGATIVE
Glucose, UA: NEGATIVE mg/dL
Hgb urine dipstick: NEGATIVE
Ketones, ur: NEGATIVE mg/dL
Leukocytes,Ua: NEGATIVE
Nitrite: NEGATIVE
Protein, ur: 30 mg/dL — AB
Specific Gravity, Urine: 1.006 (ref 1.005–1.030)
pH: 6 (ref 5.0–8.0)

## 2025-01-21 LAB — TSH: TSH: 3.19 u[IU]/mL (ref 0.35–5.50)

## 2025-01-21 LAB — GLUCOSE, CAPILLARY: Glucose-Capillary: 147 mg/dL — ABNORMAL HIGH (ref 70–99)

## 2025-01-21 MED ORDER — PANTOPRAZOLE SODIUM 40 MG PO TBEC
40.0000 mg | DELAYED_RELEASE_TABLET | Freq: Every day | ORAL | Status: AC
  Administered 2025-01-22 – 2025-01-31 (×10): 40 mg via ORAL
  Filled 2025-01-21 (×10): qty 1

## 2025-01-21 MED ORDER — ONDANSETRON HCL 4 MG PO TABS
4.0000 mg | ORAL_TABLET | Freq: Four times a day (QID) | ORAL | Status: AC | PRN
  Administered 2025-01-25: 4 mg via ORAL
  Filled 2025-01-21: qty 1

## 2025-01-21 MED ORDER — HYDROMORPHONE HCL 1 MG/ML IJ SOLN
0.5000 mg | INTRAMUSCULAR | Status: DC | PRN
  Administered 2025-01-21 – 2025-01-30 (×24): 0.5 mg via INTRAVENOUS
  Filled 2025-01-21 (×15): qty 0.5
  Filled 2025-01-21: qty 1
  Filled 2025-01-21 (×9): qty 0.5

## 2025-01-21 MED ORDER — OMEPRAZOLE 40 MG PO CPDR: 40.0000 mg | DELAYED_RELEASE_CAPSULE | Freq: Every day | ORAL | 3 refills | Status: AC

## 2025-01-21 MED ORDER — ONDANSETRON HCL 4 MG/2ML IJ SOLN
4.0000 mg | Freq: Four times a day (QID) | INTRAMUSCULAR | Status: AC | PRN
  Administered 2025-01-22 – 2025-01-29 (×6): 4 mg via INTRAVENOUS
  Filled 2025-01-21 (×6): qty 2

## 2025-01-21 MED ORDER — AMLODIPINE BESYLATE 5 MG PO TABS
10.0000 mg | ORAL_TABLET | Freq: Every day | ORAL | Status: AC
  Administered 2025-01-22 – 2025-01-31 (×10): 10 mg via ORAL
  Filled 2025-01-21 (×9): qty 2

## 2025-01-21 MED ORDER — LORAZEPAM 1 MG PO TABS
1.0000 mg | ORAL_TABLET | Freq: Four times a day (QID) | ORAL | Status: AC | PRN
  Administered 2025-01-26 – 2025-01-30 (×4): 1 mg via ORAL
  Filled 2025-01-21 (×4): qty 1

## 2025-01-21 MED ORDER — ONDANSETRON HCL 4 MG/2ML IJ SOLN
4.0000 mg | Freq: Once | INTRAMUSCULAR | Status: AC
  Administered 2025-01-21: 4 mg via INTRAVENOUS
  Filled 2025-01-21: qty 2

## 2025-01-21 MED ORDER — ACETAMINOPHEN 650 MG RE SUPP: 650.0000 mg | Freq: Four times a day (QID) | RECTAL | Status: AC | PRN

## 2025-01-21 MED ORDER — TAMSULOSIN HCL 0.4 MG PO CAPS
0.8000 mg | ORAL_CAPSULE | Freq: Every day | ORAL | Status: AC
  Administered 2025-01-22 – 2025-01-31 (×10): 0.8 mg via ORAL
  Filled 2025-01-21 (×12): qty 2

## 2025-01-21 MED ORDER — INSULIN ASPART 100 UNIT/ML IJ SOLN: 0.0000 [IU] | Freq: Every day | INTRAMUSCULAR | Status: AC

## 2025-01-21 MED ORDER — ACETAMINOPHEN 325 MG PO TABS
650.0000 mg | ORAL_TABLET | Freq: Four times a day (QID) | ORAL | Status: AC | PRN
  Filled 2025-01-21: qty 2

## 2025-01-21 MED ORDER — ALBUTEROL SULFATE (2.5 MG/3ML) 0.083% IN NEBU: 2.5000 mg | INHALATION_SOLUTION | RESPIRATORY_TRACT | Status: AC | PRN

## 2025-01-21 MED ORDER — APIXABAN 5 MG PO TABS
5.0000 mg | ORAL_TABLET | Freq: Two times a day (BID) | ORAL | Status: DC
  Administered 2025-01-21: 5 mg via ORAL
  Filled 2025-01-21 (×2): qty 1

## 2025-01-21 MED ORDER — SODIUM CHLORIDE 0.9 % IV SOLN: INTRAVENOUS | Status: AC

## 2025-01-21 MED ORDER — ROSUVASTATIN CALCIUM 20 MG PO TABS
10.0000 mg | ORAL_TABLET | Freq: Every day | ORAL | Status: AC
  Administered 2025-01-22 – 2025-01-31 (×10): 10 mg via ORAL
  Filled 2025-01-21 (×10): qty 1

## 2025-01-21 MED ORDER — OXYCODONE HCL 5 MG PO TABS
5.0000 mg | ORAL_TABLET | ORAL | Status: DC | PRN
  Administered 2025-01-21 – 2025-01-30 (×12): 5 mg via ORAL
  Filled 2025-01-21 (×13): qty 1

## 2025-01-21 MED ORDER — INSULIN ASPART 100 UNIT/ML IJ SOLN
0.0000 [IU] | Freq: Three times a day (TID) | INTRAMUSCULAR | Status: AC
  Administered 2025-01-31: 5 [IU] via SUBCUTANEOUS
  Filled 2025-01-21: qty 5
  Filled 2025-01-21 (×5): qty 2

## 2025-01-21 MED ORDER — TRAZODONE HCL 50 MG PO TABS
50.0000 mg | ORAL_TABLET | Freq: Every evening | ORAL | Status: DC | PRN
  Administered 2025-01-21 – 2025-01-27 (×3): 50 mg via ORAL
  Filled 2025-01-21 (×4): qty 1

## 2025-01-21 NOTE — Progress Notes (Signed)
 "  Rickey Cruz [id number] [birth year]   Chief Complaint: Constipation  Referring Provider: Purcell Emil Schanz, MD Primary GI MD: Dr. Shila  HPI: Rickey Cruz is a 64 y.o. male with past medical history of arthritis, asthma, colon polyps, eczema, GERD, kidney stones, prostate cancer, HLD, HTN, prediabetes, appendectomy, cholecystectomy who presents today for a complaint of constipation.    Patient last seen in office 02/06/2023 by Dr. Nandigam for complaint of persistent GERD symptoms and left upper quadrant discomfort.  Noted to have history of colon polyps with last colonoscopy being performed in 2017, no polyps seen at that time and entire examined colon was normal.  Also underwent upper endoscopy in 2017 with a normal esophagus, gastritis.  He was scheduled for EGD for further evaluation, was also due for surveillance colonoscopy so this was scheduled to be done at the same time.  Underwent EGD/colonoscopy 02/28/2023.  Colonoscopy revealed internal and external hemorrhoids, diverticulosis in the sigmoid colon, and otherwise normal with recall in 10 years recommended.  On upper endoscopy found to have LA grade B reflux esophagitis without bleeding, and esophageal ulcer without bleeding, and gastritis.  CT A/P 03/12/2023 showed a left ureteral stone causing hydronephrosis and he was advised to see his urologist as soon as possible or go to the ED, has history of retained ureteral stone.  Follow-up with [facility] oncology for history of prostate cancer, low risk prostate cancer GS 3+3=6, 2/10 cores, on biopsy 12/14/2023. PSA 4.6 on 12/13/2023 (on finasteride ). He completed SBRT to the prostate on 06/24/2024.  Last oncology visit 12/31/2024.  History of pulmonary embolism seen on CT scan in September, was started on Eliquis .  Plan is to continue that for 6 months.   Discussed the use of AI scribe software for clinical note transcription with the patient, who gave verbal consent to proceed.  History  of Present Illness Rickey Cruz is a 64 year old male with prostate cancer post-radiation therapy who presents for evaluation of chronic constipation.  Bowel Habit Changes and Constipation: - Onset of constipation after initiation of prostate cancer therapy in June of last year - Prior to treatment, bowel movements occurred every two days - Post-radiation, intervals between bowel movements increased to 2-3 days, progressing to up to one week by October - Stools are often hard, requiring significant straining - Incomplete evacuation and persistent bloating even after bowel movements - Significant abdominal distension after small meals - Weight loss of 13 pounds attributed to fear of pain with defecation - Stools are pasty and soft only after Dulcolax; otherwise, unable to pass stool - No hematochezia or melena - No use of stool softeners such as Colace - Colonoscopy a few years ago showed no polyps  Abdominal Pain and Distension: - Severe abdominal pain and bloating associated with constipation - Abdominal distension worsens after small meals - Severe abdominal pain in December led to emergency care, revealing significant stool burden  Defecatory Dysfunction and Pelvic Floor Symptoms: - Significant straining with defecation and sensation of anal tightness - Requires Vaseline for cleaning due to difficulty with evacuation and hygiene - Difficulty with evacuation and persistent sensation of incomplete emptying  Therapeutic Interventions and Response: - Enemas, oral and liquid Dulcolax, and Miralax  provided variable or minimal relief - Miralax  not effective - Dulcolax used every 3-4 days to manage symptoms since December - Has not tried stool softeners such as Colace  Urinary Symptoms and Prostate-Related Issues: - Urinary retention in December due to enlarged prostate, requiring catheter  placement - Associates pain with stool buildup causing pressure on prostate and urinary tract -  Frequent nocturnal urination causing poor sleep and daytime fatigue - Swelling in the feet worsens with constipation and improves after bowel movements  Gastroesophageal Reflux and Upper GI Symptoms: - Takes omeprazole  as needed for reflux with minimal benefit - History of reflux and prior upper endoscopy - Sometimes perceives a chemical or elastic sensation in the throat and a feeling of a dirty stomach - Uses Invisalign and Listerine due to concerns about bad breath, not discussed with dentist - Craves citrus fruits and pineapple  Fatigue and Sleep Disturbance: - Persistent fatigue and daytime sleepiness attributed to poor sleep from frequent nocturnal urination - Sometimes falls asleep at work and while driving, requiring stops to nap  Anticoagulation and Thromboembolic History: - Currently on Eliquis  for pulmonary embolism  Family History: - No family history of colon cancer or polyps   Previous GI Procedures/Imaging   KUB 12/10/2024 showed moderate amount of retained stool in the descending colon, nonobstructive bowel gas pattern.  CT renal stone study 12/10/2024 IMPRESSION: 1. Mild bilateral hydroureteronephrosis with prominently distended urinary bladder and chronic diffuse bladder wall thickening, compatible with acute bladder outlet obstruction due to the chronic marked prostatomegaly with prominent nodular mass effect on the bladder base by the enlarged nodular median lobe of the prostate. 2. No urolithiasis. 3. Coronary atherosclerosis. 4. Aortic Atherosclerosis (ICD10-I70.0).  CT A/P 03/12/2023 IMPRESSION: 1. The 3 x 4 mm distal left ureteric stone is unchanged in position, however is now obstructing. There is moderate proximal hydroureteronephrosis and mild perinephric edema that is new from prior exam. 2. Prominently enlarged prostate gland causing mass effect on the bladder base. Nodular projection at the right base of the bladder is contiguous with the  prostate gland. This is overall unchanged from exams earlier this year, however has changed in morphology with an increased nodular component compared to 2023 CTs. Direct visualization with cystoscopy may be of value to exclude bladder base mass. 3. Colonic diverticulosis without diverticulitis. 4. Borderline hepatic steatosis.   Aortic Atherosclerosis (ICD10-I70.0).  Colonoscopy 02/28/2023 - Diverticulosis in the sigmoid colon.  - Non- bleeding external and internal hemorrhoids.  - No specimens collected. - Recall 10 years  EGD 02/28/2023 - LA Grade B reflux esophagitis with no bleeding.  - Esophageal ulcer with no bleeding and no stigmata of recent bleeding. Biopsied.  - Gastroesophageal flap valve classified as Hill Grade II ( fold present, opens with respiration) .  - Gastritis. Biopsied.  - Normal examined duodenum.   Past Medical History:  Diagnosis Date   Allergy    Flonase , Patanol, Zyrtec    Arthritis    psoriasis   Asthma    seasonal, with allergies   Borderline diabetes    Cluster headache    Colon polyp 10/27/2011   Depression    denies 11/11/16; denies 11/21/18 been years   Eczema    GERD (gastroesophageal reflux disease)    History of kidney stones    x1   Hyperlipidemia    Hypertension    Low back pain    in kidney area-was told that it was related to gallstones   Prediabetes    Prostate cancer (HCC)    Psoriasis    [name]   Severe sleep apnea    h/o; had procedure and weight control, does not have to wear CPAP   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 Repeat 5 years.   CYSTOSCOPY WITH RETROGRADE PYELOGRAM,  URETEROSCOPY AND STENT PLACEMENT Left 05/22/2022   Procedure: CYSTOSCOPY WITH RETROGRADE PYELOGRAM, URETEROSCOPY AND STENT PLACEMENT; TRANSURETHRAL RESECTION OF PROSTATE;  Surgeon: Selma Donnice SAUNDERS, MD;  Location: [facility];  Service: Urology;  Laterality: Left;   CYSTOSCOPY WITH RETROGRADE PYELOGRAM, URETEROSCOPY AND STENT PLACEMENT Left 06/07/2022   Procedure: CYSTOSCOPY WITH RETROGRADE PYELOGRAM, URETEROSCOPY AND STENT EXCHANGE;  Surgeon: Elisabeth Valli BIRCH, MD;  Location: [facility];  Service: Urology;  Laterality: Left;  90 MINS   CYSTOSCOPY/URETEROSCOPY/HOLMIUM LASER/STENT PLACEMENT Left 04/27/2023   Procedure: CYSTOSCOPY LEFT URETEROSCOPY, STONE EXTRACTION, LEFT URETERAL STENT PLACEMENT;  Surgeon: Cam Morene ORN, MD;  Location: [facility];  Service: Urology;  Laterality: Left;   EXTRACORPOREAL SHOCK WAVE LITHOTRIPSY Left 05/09/2022   Procedure: EXTRACORPOREAL SHOCK WAVE LITHOTRIPSY (ESWL);  Surgeon: Alvaro Hummer, MD;  Location: [facility];  Service: Urology;  Laterality: Left;   HOLMIUM LASER APPLICATION Left 06/07/2022   Procedure: HOLMIUM LASER APPLICATION;  Surgeon: Elisabeth Valli BIRCH, MD;  Location: [facility];  Service: Urology;  Laterality: Left;   POLYPECTOMY     PROSTATE BIOPSY  10/2014   will have another in 3 months   SEPTOPLASTY  2014   TONSILLECTOMY  2014   TRANSURETHRAL RESECTION OF PROSTATE N/A 06/07/2022   Procedure: TRANSURETHRAL RESECTION OF THE PROSTATE (TURP);  Surgeon: Elisabeth Valli BIRCH, MD;  Location: [facility];  Service: Urology;  Laterality: N/A;   UVULOPALATOPHARYNGOPLASTY  2014    Current Outpatient Medications  Medication Sig Dispense Refill   amLODipine  (NORVASC ) 10 MG tablet TAKE 1 TABLET(10 MG) BY MOUTH DAILY 90 tablet 3   ELIQUIS  5 MG TABS tablet Take 5 mg by mouth 2 (two) times daily.     fluocinonide  cream (LIDEX ) 0.05 % APPLY TOPICALLY TO THE AFFECTED AREA TWICE DAILY 30 g 1   fluticasone  (FLONASE ) 50 MCG/ACT nasal spray Use two sprays  in each nostril once daily as directed. 16 g 11   ketoconazole  (NIZORAL ) 2 % shampoo Apply 1 Application topically as needed.     montelukast  (SINGULAIR ) 10 MG tablet Take 10 mg by mouth at bedtime.     omeprazole  (PRILOSEC) 20 MG capsule Take 1 capsule (20 mg total) by mouth daily. 30 capsule 11   rosuvastatin  (CRESTOR ) 10 MG tablet TAKE 1 TABLET(10 MG) BY MOUTH DAILY 90 tablet 3   tamsulosin  (FLOMAX ) 0.4 MG CAPS capsule Take 0.8 mg by mouth.     VTAMA 1 % CREA Apply topically daily.     cephALEXin  (KEFLEX ) 500 MG capsule Take 1 capsule (500 mg total) by mouth 4 (four) times daily. (Patient not taking: Reported on 01/21/2025) 28 capsule 0   finasteride  (PROSCAR ) 5 MG tablet Take 5 mg by mouth daily. (Patient not taking: Reported on 01/21/2025)     HYDROcodone  bit-homatropine (HYCODAN) 5-1.5 MG/5ML syrup Take 5 mLs by mouth at bedtime as needed for cough. (Patient not taking: Reported on 01/21/2025) 120 mL 0   phenazopyridine  (PYRIDIUM ) 200 MG tablet Take 1 tablet (200 mg total) by mouth 3 (three) times daily as needed for pain (burining). (Patient not taking: Reported on 01/21/2025) 9 tablet 0   Vitamin D , Ergocalciferol , (DRISDOL ) 1.25 MG (50000  UNIT) CAPS capsule Take 1 capsule (50,000 Units total) by mouth every 7 (seven) days. (Patient not taking: Reported on 01/21/2025) 7 capsule 1   No current facility-administered medications for this visit.    Allergies as of 01/21/2025   (No Known Allergies)    Family History  Problem Relation Age of Onset   Stroke Mother    Asthma Sister    Colon cancer Neg Hx    Esophageal cancer Neg Hx    Stomach cancer Neg Hx    Prostate cancer Neg Hx    Diabetes Neg Hx    CAD Neg Hx    Migraines Neg Hx    Headache Neg Hx     Social History[1]   Review of Systems:    Constitutional: No weight loss, fever, chills Cardiovascular: No chest pain Respiratory: No SOB Gastrointestinal: See HPI and otherwise negative   Physical Exam:  Vital signs: BP  110/78   Pulse 67   Ht 5' 5 (1.651 m)   Wt 184 lb (83.5 kg)   BMI 30.62 kg/m   Wt Readings from Last 3 Encounters:  01/21/25 184 lb (83.5 kg)  12/14/24 185 lb (83.9 kg)  12/10/24 184 lb (83.5 kg)     Constitutional: Pleasant, overweight male in NAD, alert and cooperative Head:  Normocephalic and atraumatic.  Respiratory: Respirations even and unlabored. Lungs clear to auscultation bilaterally.  No wheezes, crackles, or rhonchi.  Cardiovascular:  Regular rate and rhythm. No murmurs. No peripheral edema. Gastrointestinal:  Nondistended, tender to palpation of right lower quadrant.  Abdomen feels firm suprapubic area, the rest of the abdomen is soft.  No rebound.  Bowel sounds present, slightly sluggish.  Rectal:  Not performed.  Neurologic:  Alert and oriented x4;  grossly normal neurologically.  Skin:   Dry and intact without significant lesions or rashes. Psychiatric: Oriented to person, place and time. Demonstrates good judgement and reason without abnormal affect or behaviors.   Assessment/Plan:   Assessment & Plan Constipation Change in bowel habits RLQ abdominal pain Abdominal bloating Patient with new onset constipation since June 2025 following radiation therapy for prostate cancer.  Previously was having a bowel movement every 2 days without difficulty, now can go several days without a bowel movement, up to a week, and has been using Dulcolax every 3 to 4 days as needed to produce a bowel movement but still has sensation of incomplete evacuation.  Constipation likely multifactorial, possibly related to pelvic floor dysfunction post-radiation, dehydration. No evidence of colorectal malignancy on CT imaging in September (done without contrast due to suspicion for kidney stone) or colonoscopy 2024.  No evidence of obstruction on KUB in December though there was a moderate stool burden noted in the descending colon. Patient denies any rectal bleeding or melena. On exam he is  tender to palpation of the right lower quadrant and there is palpable firmness in the suprapubic area.  He has previous findings on imaging of prostatomegaly. Has tried MiraLAX  previously without benefit. Has been feeling fatigued recently.  Notes that he gets poor sleep as he has to urinate frequently during the night.  No evidence of anemia on most recent CBC.  - Labs today: CBC, CMP, TSH, TTG, IgA - Order KUB to be done today to rule out obstruction - Order CT A/P with oral and IV contrast to further evaluate GI tract given change in bowel habits, and tenderness to palpation on exam today with palpable firmness in lower abdomen - If no evidence of  obstruction then start Linzess 145 mcg, samples given - Advised patient can also use Dulcolax every few days as needed until starting Linzess - Can trial OTC Colace - Advised increased dietary fiber and increased water  intake - Refer to pelvic floor physical therapy due to suspected pelvic floor dysfunction following radiation treatment for prostate cancer - Follow-up with Dr. Nandigam in 6 weeks to discuss possibility of colonoscopy should symptoms persist.  Patient is on Eliquis  but should be able to hold this after March (will have been on Eliquis  for 6 months following pulmonary embolism)  Gastroesophageal reflux disease Patient with history of GERD, last EGD 02/2023 with findings of reflux esophagitis and an esophageal ulcer.  He has recently been using some leftover omeprazole  20 mg which he had on hand for some dyspepsia and what sounds like acid reflux.  Advised that he can increase to 40 mg, he prefers to use up the medication that he has at home and will double up on his 20 mg dose.  - Increased omeprazole  to 40 mg daily - Offered prescription for higher dose omeprazole  if needed. - Advised continuation of omeprazole  for ongoing symptoms. - Recommended dental evaluation for halitosis to rule out oral causes. - Provided guidance on therapy  escalation if symptoms worsen. - Could consider repeat EGD if symptoms persist or worsen  Prostate cancer s/p radiation Chronic lower urinary tract symptoms exacerbated by constipation and stool burden, with recent urinary retention episodes but no acute symptoms. Prostate cancer under surveillance with no metastatic disease.  Follows with a local urologist and has an upcoming appointment in March.  - Advised ongoing follow-up with oncology for prostate cancer surveillance as well as with local urologist.    Camie Furbish, PA-C [facility] 01/21/2025, 10:43 AM  Patient Care Team: Purcell Emil Schanz, MD as PCP - General (Internal Medicine)       [1]  Social History Tobacco Use   Smoking status: Never   Smokeless tobacco: Never  Vaping Use   Vaping status: Never Used  Substance Use Topics   Alcohol use: Never    Alcohol/week: 0.0 standard drinks of alcohol   Drug use: Never   "

## 2025-01-21 NOTE — H&P (Signed)
 " History and Physical  ANES RIGEL FMW:[id number] DOB: [birth year] DOA: 01/21/2025  PCP: Purcell Emil Schanz, MD   Chief Complaint: Renal failure, abdominal discomfort  HPI: Rickey Cruz is a 64 y.o. male with medical history significant for hypertension, hyperlipidemia, GERD, BPH, prostate cancer status post radiation being admitted to the hospital with acute renal failure in the setting of bladder outlet obstruction.  He does have a prior history of chronic constipation and abdominal discomfort for which he is also followed by GI.  He was seen in the ER on 12/10/2024 due to bladder outlet obstruction felt to be related to his history of BPH and prostate cancer and on the day Foley catheter was placed, it was removed as an outpatient eventually after he was seen by Dr. Carolee.  Today he went to GI for routine follow-up, continued to complain of abdominal discomfort and they were planning some imaging.  Lab work done prior to the visit showed AKI with creatinine 5.05 from baseline normal renal function.  Review of Systems: Please see HPI for pertinent positives and negatives. A complete 10 system review of systems are otherwise negative.  Past Medical History:  Diagnosis Date   Allergy    Flonase , Patanol, Zyrtec    Arthritis    psoriasis   Asthma    seasonal, with allergies   Borderline diabetes    Cluster headache    Colon polyp 10/27/2011   Depression    denies 11/11/16; denies 11/21/18 been years   Eczema    GERD (gastroesophageal reflux disease)    History of kidney stones    x1   Hyperlipidemia    Hypertension    Low back pain    in kidney area-was told that it was related to gallstones   Prediabetes    Prostate cancer (HCC)    Psoriasis    [name]   Severe sleep apnea    h/o; had procedure and weight control, does not have to wear CPAP   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 Repeat 5 years.   CYSTOSCOPY WITH RETROGRADE PYELOGRAM, URETEROSCOPY AND STENT PLACEMENT Left 05/22/2022   Procedure: CYSTOSCOPY WITH RETROGRADE PYELOGRAM, URETEROSCOPY AND STENT PLACEMENT; TRANSURETHRAL RESECTION OF PROSTATE;  Surgeon: Selma Donnice SAUNDERS, MD;  Location: [facility];  Service: Urology;  Laterality: Left;   CYSTOSCOPY WITH RETROGRADE PYELOGRAM, URETEROSCOPY AND STENT PLACEMENT Left 06/07/2022   Procedure: CYSTOSCOPY WITH RETROGRADE PYELOGRAM, URETEROSCOPY AND STENT EXCHANGE;  Surgeon: Elisabeth Valli BIRCH, MD;  Location: [facility];  Service: Urology;  Laterality: Left;  90 MINS   CYSTOSCOPY/URETEROSCOPY/HOLMIUM LASER/STENT PLACEMENT Left 04/27/2023   Procedure: CYSTOSCOPY LEFT URETEROSCOPY, STONE EXTRACTION, LEFT URETERAL STENT PLACEMENT;  Surgeon: Cam Morene ORN, MD;  Location: [facility];  Service: Urology;  Laterality: Left;   EXTRACORPOREAL SHOCK WAVE LITHOTRIPSY Left 05/09/2022   Procedure: EXTRACORPOREAL SHOCK WAVE LITHOTRIPSY (ESWL);  Surgeon: Alvaro Hummer, MD;  Location: [facility];  Service: Urology;  Laterality: Left;   HOLMIUM LASER APPLICATION Left 06/07/2022   Procedure: HOLMIUM LASER APPLICATION;  Surgeon: Elisabeth Valli BIRCH, MD;  Location: [facility];  Service: Urology;  Laterality: Left;   POLYPECTOMY  PROSTATE BIOPSY  10/2014   will have another in 3 months   SEPTOPLASTY  2014   TONSILLECTOMY  2014   TRANSURETHRAL RESECTION OF PROSTATE N/A 06/07/2022   Procedure: TRANSURETHRAL RESECTION OF THE PROSTATE (TURP);  Surgeon: Elisabeth Valli BIRCH, MD;  Location: [facility];  Service: Urology;  Laterality: N/A;   UVULOPALATOPHARYNGOPLASTY  2014   Social History:  reports that he has never smoked. He has never  used smokeless tobacco. He reports that he does not drink alcohol and does not use drugs.  Allergies[1]  Family History  Problem Relation Age of Onset   Stroke Mother    Asthma Sister    Colon cancer Neg Hx    Esophageal cancer Neg Hx    Stomach cancer Neg Hx    Prostate cancer Neg Hx    Diabetes Neg Hx    CAD Neg Hx    Migraines Neg Hx    Headache Neg Hx      Prior to Admission medications  Medication Sig Start Date End Date Taking? Authorizing Provider  amLODipine  (NORVASC ) 10 MG tablet TAKE 1 TABLET(10 MG) BY MOUTH DAILY 03/05/24   Purcell Emil Schanz, MD  cephALEXin  (KEFLEX ) 500 MG capsule Take 1 capsule (500 mg total) by mouth 4 (four) times daily. Patient not taking: Reported on 01/21/2025 12/14/24   Suzette Pac, MD  ELIQUIS  5 MG TABS tablet Take 5 mg by mouth 2 (two) times daily.    [provider]  finasteride  (PROSCAR ) 5 MG tablet Take 5 mg by mouth daily. Patient not taking: Reported on 01/21/2025 01/15/23   [provider]  fluocinonide  cream (LIDEX ) 0.05 % APPLY TOPICALLY TO THE AFFECTED AREA TWICE DAILY 03/05/24   Purcell Emil Schanz, MD  fluticasone  (FLONASE ) 50 MCG/ACT nasal spray Use two sprays in each nostril once daily as directed. 01/10/24   Kozlow, Camellia PARAS, MD  HYDROcodone  bit-homatropine (HYCODAN) 5-1.5 MG/5ML syrup Take 5 mLs by mouth at bedtime as needed for cough. Patient not taking: Reported on 01/21/2025 05/29/24   Purcell Emil Schanz, MD  ketoconazole  (NIZORAL ) 2 % shampoo Apply 1 Application topically as needed. 01/04/23   [provider]  montelukast  (SINGULAIR ) 10 MG tablet Take 10 mg by mouth at bedtime.    [provider]  omeprazole  (PRILOSEC) 40 MG capsule Take 1 capsule (40 mg total) by mouth daily. 01/21/25   Heinz, Sara E, PA-C  phenazopyridine  (PYRIDIUM ) 200 MG tablet Take 1 tablet (200 mg total) by mouth 3 (three) times daily as needed for pain (burining). Patient not taking: Reported on 01/21/2025 12/14/24    Zammit, Joseph, MD  rosuvastatin  (CRESTOR ) 10 MG tablet TAKE 1 TABLET(10 MG) BY MOUTH DAILY 08/29/24   Sagardia, Miguel Jose, MD  tamsulosin  (FLOMAX ) 0.4 MG CAPS capsule Take 0.8 mg by mouth. 04/26/24   [provider]  Vitamin D , Ergocalciferol , (DRISDOL ) 1.25 MG (50000 UNIT) CAPS capsule Take 1 capsule (50,000 Units total) by mouth every 7 (seven) days. Patient not taking: Reported on 01/21/2025 05/02/24   Sagardia, Miguel Jose, MD  VTAMA 1 % CREA Apply topically daily.    [provider]    Physical Exam: BP (!) 137/94 (BP Location: Left Arm)   Pulse 74   Temp 97.8 F (36.6 C) (Oral)   Resp 17   Ht 5' 5 (1.651 m)   Wt 83 kg   SpO2 100%   BMI 30.45 kg/m  General:  Alert, oriented, calm, in no acute distress  Eyes: EOMI, clear conjuctivae,  white sclerea Neck: supple, no masses, trachea mildline  Cardiovascular: RRR, no murmurs or rubs, no peripheral edema  Respiratory: clear to auscultation bilaterally, no wheezes, no crackles  Abdomen: soft, tender palpable bladder, nondistended, with normal bowel tones heard Skin: dry, no rashes  Musculoskeletal: no joint effusions, normal range of motion  Psychiatric: appropriate affect, normal speech  Neurologic: extraocular muscles intact, clear speech, moving all extremities with intact sensorium         Labs on Admission:  Basic Metabolic Panel: Recent Labs  Lab 01/21/25 1159 01/21/25 1459  NA 141 139  K 4.1 4.1  CL 104 102  CO2 26 24  GLUCOSE 107* 240*  BUN 51* 50*  CREATININE 5.05* 4.98*  CALCIUM  9.3 9.3   Liver Function Tests: Recent Labs  Lab 01/21/25 1159 01/21/25 1459  AST 24 30  ALT 28 33  ALKPHOS 56 63  BILITOT 0.5 0.4  PROT 7.6 7.2  ALBUMIN 4.5 4.4   No results for input(s): LIPASE, AMYLASE in the last 168 hours. No results for input(s): AMMONIA in the last 168 hours. CBC: Recent Labs  Lab 01/21/25 1159 01/21/25 1459  WBC 4.2 4.4  NEUTROABS 3.1  --   HGB 14.1 13.5  HCT 40.3 38.8*   MCV 86.8 87.2  PLT 181.0 165   Cardiac Enzymes: No results for input(s): CKTOTAL, CKMB, CKMBINDEX, TROPONINI in the last 168 hours. BNP (last 3 results) No results for input(s): BNP in the last 8760 hours.  ProBNP (last 3 results) No results for input(s): PROBNP in the last 8760 hours.  CBG: No results for input(s): GLUCAP in the last 168 hours.  Radiological Exams on Admission: CT ABDOMEN PELVIS WO CONTRAST Result Date: 01/21/2025 CLINICAL DATA:  Right-sided flank pain since December. Acute renal failure. Nausea. Dysuria. EXAM: CT ABDOMEN AND PELVIS WITHOUT CONTRAST TECHNIQUE: Multidetector CT imaging of the abdomen and pelvis was performed following the standard protocol without IV contrast. RADIATION DOSE REDUCTION: This exam was performed according to the departmental dose-optimization program which includes automated exposure control, adjustment of the mA and/or kV according to patient size and/or use of iterative reconstruction technique. COMPARISON:  12/10/2024 FINDINGS: Lower chest: A 2 mm left lower lobe pulmonary nodule on 19/5 can be presumed benign and do/does not warrant imaging follow-up per Fleischner criteria. Mild cardiomegaly. Right coronary artery calcification. Hepatobiliary: Normal liver. Cholecystectomy, without biliary ductal dilatation. Pancreas: Normal, without mass or ductal dilatation. Spleen: Normal in size, without focal abnormality. Adrenals/Urinary Tract: Normal adrenal glands. No renal calculi. Subcentimeter low-density interpolar left renal lesion is likely a cyst. No follow-up indicated. Moderate left and mild right-sided hydroureteronephrosis are similar. Followed to the level of a mildly distended urinary bladder with median lobe prostatic impression within. Mild similar bladder wall thickening. No ureteric or bladder calculi. Stomach/Bowel: Normal stomach, without wall thickening. Normal colon and terminal ileum. Appendectomy. Normal small bowel.  Vascular/Lymphatic: Aortic atherosclerosis. No abdominopelvic adenopathy. Reproductive: Moderate prostatomegaly with median lobe impression into the bladder. Other: No significant free fluid.  No free intraperitoneal air. Musculoskeletal:   No acute osseous abnormality. IMPRESSION: No significant change since 12/10/2024. Prostatomegaly with median lobe prostatic impression. Moderate left and mild right-sided hydroureteronephrosis, followed to the level of a distended bladder. Findings likely represent outlet obstruction with secondary hydronephrosis. No urinary tract calculi. Incidental findings, including: Coronary artery atherosclerosis. Aortic Atherosclerosis (ICD10-I70.0). Electronically Signed   By: Rockey Kilts M.D.   On: 01/21/2025 16:16   Assessment/Plan Rickey Cruz is a 64 y.o. male with  medical history significant for hypertension, hyperlipidemia, GERD, BPH, prostate cancer status post radiation being admitted to the hospital with acute renal failure in the setting of bladder outlet obstruction.    Acute kidney injury-likely due to bladder outlet obstruction from a combination of BPH and history of prostate cancer status post radiation.  He previously had bladder outlet obstruction, which was resolved with Foley catheter placement, and Foley catheter was eventually removed. -Observation admission -Discussed with urology Dr. Carolee who recommends coud catheter which has been placed in the ER -Gentle IV fluids, avoid nephrotoxins, trend renal function  Hypertension-amlodipine   Hyperlipidemia-Crestor   History of PE-diagnosed in September, per Dr. Carolee okay to continue anticoagulation -Continue renally dosed Eliquis  twice daily    Code Status: Full Code  Consults called: Discussed with urology Dr. Carolee  Admission status: Observation  Time spent: 48 minutes  Rick Carruthers CHRISTELLA Gail MD [facility] Pager [phone]  If 7PM-7AM, please contact night-coverage [email or web address] Password  [email or web address]  01/21/2025, 5:07 PM      [1] No Known Allergies  "

## 2025-01-21 NOTE — ED Notes (Signed)
 Post void 

## 2025-01-21 NOTE — Telephone Encounter (Signed)
 Critical lab results received on this patient, BUN 51, creatinine 5.05, GFR 11.47.  Significant decline in kidney function compared to labs one month ago. He has history of prostate cancer s/p radiation and kidney stones, and was experiencing right lower quadrant abdominal pain today with palpable firmness in suprapubic area. I spoke to patient over the phone and advised him to proceed to the ED, he was agreeable, stated he would go directly.  Recommend that we cancel the CT scan that was ordered today.  He will not be able to have contrast given his renal function, and I suspect that this abdominal pain is due to a urinary rather than a GI issue.  Abdominal imaging can be revisited at his follow-up appointment.  I will set a reminder to follow up on his ED visit in a couple days.

## 2025-01-21 NOTE — ED Triage Notes (Signed)
 Patient has had right sided flank pain since December. Was told his kidney function is low and his PCP said to go to ED. Has nausea. No vomiting. Has burning when he urinates.

## 2025-01-21 NOTE — ED Provider Notes (Signed)
 " [place] [facility] Provider Note   CSN: [id number] Arrival date & time: 01/17/25  0836    HPI 64 yo male with h/o arthritis, asthma, colon polys, GERD, kidney stones, HTN, HLD, prediabetes, prostate cancer diagnosed 3/23, s/p XRT 05/2024 at [facility] (he went because he was declined treatment here), has been having chronic constipation since XRT but worsening. Seen by GI today and on routine labs noted creatinine to 5.05 from normal range. Sent to ED. Patient reports normal urination, just some pain with urinating that started this morning. Seen here 12/16 with urinary retention and briefly had a foley but he thinks the obstruction was related to constipation. Last BM 2pm but does not feel that he fully evacuated. Took laxative last night.  Current Outpatient Medications  Medication Instructions   amLODipine  (NORVASC ) 10 MG tablet TAKE 1 TABLET(10 MG) BY MOUTH DAILY   cephALEXin  (KEFLEX ) 500 mg, Oral, 4 times daily   Eliquis  5 mg, Oral, 2 times daily   fluocinonide  cream (LIDEX ) 0.05 % APPLY TOPICALLY TO THE AFFECTED AREA TWICE DAILY   fluticasone  (FLONASE ) 50 MCG/ACT nasal spray Use two sprays in each nostril once daily as directed.   HYDROcodone  bit-homatropine (HYCODAN) 5-1.5 MG/5ML syrup 5 mLs, Oral, At bedtime PRN   omeprazole  (PRILOSEC) 40 mg, Oral, Daily   phenazopyridine  (PYRIDIUM ) 200 mg, Oral, 3 times daily PRN   rosuvastatin  (CRESTOR ) 10 MG tablet TAKE 1 TABLET(10 MG) BY MOUTH DAILY   tamsulosin  (FLOMAX ) 0.8 mg, Daily   TYLENOL  500-1,000 mg, Oral, Every 6 hours PRN   Vitamin D  (Ergocalciferol ) (DRISDOL ) 50,000 Units, Oral, Every 7 days     Allergies[1]   Review of Systems  +constipation  Updated Vital Signs BP (!) 139/101   Pulse 97   Temp 99.3 F (37.4 C) (Oral)   Resp 18   LMP 01/12/2025   SpO2 99%   Physical Exam  Constitutional: Patient appears well-developed and well-nourished. No distress.  HENT:  Head: Normocephalic and  atraumatic.  Mouth/Throat: Oropharynx is clear and moist. No oropharyngeal exudate.  Eyes: Conjunctivae are normal. Pupils are equal, round, and reactive to light. Neck: Normal range of motion. Neck supple.  Cardiovascular: Normal rate, regular rhythm, normal heart sounds and intact distal pulses.   Pulmonary/Chest: Effort normal and breath sounds normal. No respiratory distress. No wheezes or rales.  Abdominal: Soft. Bowel sounds are normal.  Suprapubic tenderness and palpable bladder enlargement.  Minimal bilateral CVA tenderness. Musculoskeletal: Normal range of motion. No edema or tenderness.  Neurological: Patient is alert and oriented.  No facial droop.  Clear speech.  Normal strength and sensation throughout.  Normal coordination. Skin: Skin is warm and dry. No diaphoresis.  Psychiatric: Normal mood and affect. Normal behavior. Judgment and thought content normal.  Nursing note and vitals reviewed.    Labs Reviewed  COMPREHENSIVE METABOLIC PANEL WITH GFR - Abnormal; Notable for the following components:      Result Value   Glucose, Bld 240 (*)    BUN 50 (*)    Creatinine, Ser 4.98 (*)    GFR, Estimated 12 (*)    All other components within normal limits  CBC - Abnormal; Notable for the following components:   HCT 38.8 (*)    All other components within normal limits  URINALYSIS, ROUTINE W REFLEX MICROSCOPIC - Abnormal; Notable for the following components:   Color, Urine STRAW (*)    Protein, ur 30 (*)    Bacteria, UA RARE (*)  All other components within normal limits  BASIC METABOLIC PANEL WITH GFR - Abnormal; Notable for the following components:   Glucose, Bld 124 (*)    BUN 34 (*)    Creatinine, Ser 2.88 (*)    Calcium  8.6 (*)    GFR, Estimated 24 (*)    All other components within normal limits  CBC - Abnormal; Notable for the following components:   HCT 38.9 (*)    All other components within normal limits  HEMOGLOBIN A1C - Abnormal; Notable for the following  components:   Hgb A1c MFr Bld 6.0 (*)    All other components within normal limits  GLUCOSE, CAPILLARY - Abnormal; Notable for the following components:   Glucose-Capillary 147 (*)    All other components within normal limits  GLUCOSE, CAPILLARY - Abnormal; Notable for the following components:   Glucose-Capillary 130 (*)    All other components within normal limits  GLUCOSE, CAPILLARY - Abnormal; Notable for the following components:   Glucose-Capillary 134 (*)    All other components within normal limits  GLUCOSE, CAPILLARY - Abnormal; Notable for the following components:   Glucose-Capillary 121 (*)    All other components within normal limits  CBC - Abnormal; Notable for the following components:   HCT 38.6 (*)    All other components within normal limits  BASIC METABOLIC PANEL WITH GFR - Abnormal; Notable for the following components:   Glucose, Bld 133 (*)    Creatinine, Ser 1.94 (*)    Calcium  8.8 (*)    GFR, Estimated 38 (*)    All other components within normal limits  GLUCOSE, CAPILLARY - Abnormal; Notable for the following components:   Glucose-Capillary 141 (*)    All other components within normal limits  GLUCOSE, CAPILLARY - Abnormal; Notable for the following components:   Glucose-Capillary 110 (*)    All other components within normal limits  GLUCOSE, CAPILLARY - Abnormal; Notable for the following components:   Glucose-Capillary 110 (*)    All other components within normal limits  BASIC METABOLIC PANEL WITH GFR - Abnormal; Notable for the following components:   Potassium 3.3 (*)    Glucose, Bld 102 (*)    Creatinine, Ser 1.55 (*)    Calcium  8.5 (*)    GFR, Estimated 50 (*)    All other components within normal limits  CBC - Abnormal; Notable for the following components:   RBC 3.94 (*)    Hemoglobin 11.9 (*)    HCT 34.5 (*)    Platelets 148 (*)    All other components within normal limits  GLUCOSE, CAPILLARY - Abnormal; Notable for the following  components:   Glucose-Capillary 125 (*)    All other components within normal limits  GLUCOSE, CAPILLARY - Abnormal; Notable for the following components:   Glucose-Capillary 128 (*)    All other components within normal limits  GLUCOSE, CAPILLARY - Abnormal; Notable for the following components:   Glucose-Capillary 108 (*)    All other components within normal limits  BASIC METABOLIC PANEL WITH GFR - Abnormal; Notable for the following components:   Potassium 3.1 (*)    Glucose, Bld 104 (*)    Creatinine, Ser 1.50 (*)    Calcium  8.5 (*)    GFR, Estimated 52 (*)    All other components within normal limits  CBC - Abnormal; Notable for the following components:   RBC 3.96 (*)    Hemoglobin 11.8 (*)    HCT 34.9 (*)  All other components within normal limits  GLUCOSE, CAPILLARY - Abnormal; Notable for the following components:   Glucose-Capillary 131 (*)    All other components within normal limits  GLUCOSE, CAPILLARY  GLUCOSE, CAPILLARY  GLUCOSE, CAPILLARY  GLUCOSE, CAPILLARY     CT ABDOMEN PELVIS WO CONTRAST  Final Result        EKG Interpretation Date/Time:    Ventricular Rate:    PR Interval:    QRS Duration:    QT Interval:    QTC Calculation:   R Axis:      Text Interpretation:           Medications Ordered in the ED  amLODipine  (NORVASC ) tablet 10 mg (10 mg Oral Given 01/25/25 1003)  pantoprazole  (PROTONIX ) EC tablet 40 mg (40 mg Oral Given 01/25/25 1002)  rosuvastatin  (CRESTOR ) tablet 10 mg (10 mg Oral Given 01/25/25 1002)  tamsulosin  (FLOMAX ) capsule 0.8 mg (0.8 mg Oral Given 01/24/25 1810)  acetaminophen  (TYLENOL ) tablet 650 mg (has no administration in time range)    Or  acetaminophen  (TYLENOL ) suppository 650 mg (has no administration in time range)  ondansetron  (ZOFRAN ) tablet 4 mg ( Oral See Alternative 01/25/25 1226)    Or  ondansetron  (ZOFRAN ) injection 4 mg (4 mg Intravenous Given 01/25/25 1226)  albuterol  (PROVENTIL ) (2.5 MG/3ML) 0.083%  nebulizer solution 2.5 mg (has no administration in time range)  0.9 %  sodium chloride  infusion (0 mLs Intravenous Stopped 01/22/25 1650)  traZODone  (DESYREL ) tablet 50 mg (50 mg Oral Given 01/23/25 2318)  oxyCODONE  (Oxy IR/ROXICODONE ) immediate release tablet 5 mg (5 mg Oral Given 01/23/25 2047)  HYDROmorphone  (DILAUDID ) injection 0.5 mg (0.5 mg Intravenous Given 01/25/25 1222)  insulin  aspart (novoLOG ) injection 0-15 Units ( Subcutaneous Not Given 01/25/25 1218)  insulin  aspart (novoLOG ) injection 0-5 Units ( Subcutaneous Not Given 01/24/25 2132)  LORazepam  (ATIVAN ) tablet 1 mg (has no administration in time range)  mirabegron  ER (MYRBETRIQ ) tablet 25 mg (25 mg Oral Given 01/25/25 1003)  Chlorhexidine  Gluconate Cloth 2 % PADS 6 each (6 each Topical Given 01/25/25 1000)  hyoscyamine  (ANASPAZ ) disintergrating tablet 0.125 mg (0.125 mg Sublingual Given 01/25/25 1217)  0.9 %  sodium chloride  infusion (0 mLs Intravenous Stopped 01/23/25 1521)  senna (SENOKOT) tablet 17.2 mg (17.2 mg Oral Given 01/25/25 1003)  polyethylene glycol (MIRALAX  / GLYCOLAX ) packet 17 g (17 g Oral Given 01/25/25 1216)  0.9 %  sodium chloride  infusion (0 mLs Intravenous Stopped 01/25/25 0728)  lactated ringers  infusion ( Intravenous Infusion Verify 01/25/25 1537)  ondansetron  (ZOFRAN ) injection 4 mg (4 mg Intravenous Given 01/21/25 1711)  phenazopyridine  (PYRIDIUM ) tablet 100 mg (100 mg Oral Given 01/22/25 0029)  belladonna-opium  (B&O) suppository 16.2-60mg  (1 suppository Rectal Given 01/22/25 0029)  living well with diabetes book MISC ( Does not apply Given 01/23/25 1601)  potassium chloride  SA (KLOR-CON  M) CR tablet 40 mEq (40 mEq Oral Given 01/24/25 1128)  potassium chloride  SA (KLOR-CON  M) CR tablet 40 mEq (40 mEq Oral Given 01/25/25 1216)  bisacodyl  (DULCOLAX) EC tablet 5 mg (5 mg Oral Given 01/25/25 1216)                                     This patient presents to the ED with chief complaint(s) of constipation and acute kidney  injury with pertinent past medical history of arthritis, asthma, colon polys, GERD, kidney stones, HTN, HLD, prediabetes, prostate cancer diagnosed 3/23, s/p XRT 05/2024  at [facility] (he went because he was declined treatment here), has been having chronic constipation since XRT . The complaint involves an extensive differential diagnosis and also carries with it a high risk of complications and morbidity.    Additional history obtained from friend at bedside. I have also reviewed recent outpatient records  The differential diagnosis includes urinary obstruction, dehydration  The initial management included fluids, labs, postvoid residual, CT scan    Reassessments:  The following labs were independently interpreted: Creatinine nearly 5 from a normal baseline.  CT showing outflow obstruction.   Treatment and Reassessment: Discussed with urology who advises 79 French coud catheter as they placed this for patient last time he had obstruction and it worked well.  This has been ordered and nurse is comfortable with placement.  Urologist has offered to assist with placement if we need it.  Medicine will admit patient.  Consultation: - Consulted or discussed management/test interpretation with external professional: Urology and medicine  Consideration for admission or further workup: Warrants admission  Social Determinants of health: None    ICD-10-CM   1. AKI (acute kidney injury)  N17.9     2. Acute urinary retention  R33.8     3. 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

## 2025-01-21 NOTE — Patient Instructions (Addendum)
 Recommend increasing water  and dietary fiber intake.  - Drink at least 64-80 ounces of water /liquid per day. - Sit all of the way back on the toilet keeping your back fairly straight and while sitting up, try to rest the tops of your forearms on your upper thighs.   - Raising your feet with a step stool/squatty potty can be helpful to improve the angle that allows your stool to pass through the rectum. - Relax the rectum feeling it bulge toward the toilet water .  If you feel your rectum raising toward your body, you are contracting rather than relaxing. - Breathe in and slowly exhale. Belly breathe by expanding your belly towards your belly button. Keep belly expanded as you gently direct pressure down and back to the anus.  A low pitched GRRR sound can assist with increasing intra-abdominal pressure.  (Can also trying to blow on a pinwheel and make it move, this helps with the same belly breathing) - Repeat 3-4 times. If unsuccessful, contract the pelvic floor to restore normal tone and get off the toilet.  Avoid excessive straining. - To reduce excessive wiping by teaching your anus to normally contract, place hands on outer aspect of knees and resist knee movement outward.  Hold 5-10 second then place hands just inside of knees and resist inward movement of knees.  Hold 5 seconds.  Repeat a few times each way.  Go to the ER if unable to pass gas, severe abdominal pain, unable to hold down food, any shortness of breath of chest pain.   You have been scheduled for a CT scan of the abdomen and pelvis at [facility], 1st floor Radiology. You are scheduled on Tuesday January 28, 2025 at 2:00 pm. You should arrive at 11:45 am (15 minutes prior to your appointment time) for registration.  Please follow the written instructions below on the day of your exam:   1) Do not eat anything after 10:00 am (4 hours prior to your test)   2) Drink 1 bottle of contrast @ 12:00 pm (2 hours prior to your  exam)       3) Drink 1 bottle of contrast @ 1:00 pm (1 hour prior to your exam)   You may take any medications as prescribed with a small amount of water , if necessary. If you take any of the following medications: METFORMIN, GLUCOPHAGE, GLUCOVANCE, AVANDAMET, RIOMET, FORTAMET, ACTOPLUS MET, JANUMET, GLUMETZA or METAGLIP, you MAY be asked to HOLD this medication 48 hours AFTER the exam.   The purpose of you drinking the oral contrast is to aid in the visualization of your intestinal tract. The contrast solution may cause some diarrhea. Depending on your individual set of symptoms, you may also receive an intravenous injection of x-ray contrast/dye. Plan on being at [facility] for 45 minutes or longer, depending on the type of exam you are having performed  If you have any questions regarding your exam or if you need to reschedule, you may call [facility] at [phone] between the hours of 8:00 am and 5:00 pm, Monday-Friday.   CAN USE OVER THE COUNTER COLACE (STOOL SOFTENER)  We have given you samples of the following medication to take: LINZESS 145 mcg   CAN START AFTER KUB RESULTS (IF YOU HAVE NO OBSTRUCTION)  Your provider has requested that you have an abdominal x ray before leaving today. Please go to the basement floor to our Radiology department for the test.  Please go to the lab  in the basement of our building to have lab work done as you leave today. Hit B for basement when you get on the elevator.  When the doors open the lab is on your left.  We will call you with the results. Thank you.   _______________________________________________________  If your blood pressure at your visit was 140/90 or greater, please contact your primary care physician to follow up on this.  _______________________________________________________  If you are age 64 or older, your body mass index should be between 23-30. Your Body mass index is 30.62 kg/m. If this is out of the  aforementioned range listed, please consider follow up with your Primary Care Provider.  If you are age 64 or younger, your body mass index should be between 19-25. Your Body mass index is 30.62 kg/m. If this is out of the aformentioned range listed, please consider follow up with your Primary Care Provider.   ________________________________________________________  The [facility] providers would like to encourage you to use MYCHART to communicate with providers for non-urgent requests or questions.  Due to long hold times on the telephone, sending your provider a message by [facility] may be a faster and more efficient way to get a response.  Please allow 48 business hours for a response.  Please remember that this is for non-urgent requests.  _______________________________________________________  [facility] is using a team-based approach to care.  Your team is made up of your doctor and two to three APPS. Our APPS (Nurse Practitioners and Physician Assistants) work with your physician to ensure care continuity for you. They are fully qualified to address your health concerns and develop a treatment plan. They communicate directly with your gastroenterologist to care for you. Seeing the Advanced Practice Practitioners on your physician's team can help you by facilitating care more promptly, often allowing for earlier appointments, access to diagnostic testing, procedures, and other specialty referrals.   Due to recent changes in healthcare laws, you may see the results of your imaging and laboratory studies on MyChart before your provider has had a chance to review them.  We understand that in some cases there may be results that are confusing or concerning to you. Not all laboratory results come back in the same time frame and the provider may be waiting for multiple results in order to interpret others.  Please give us  48 hours in order for your provider to thoroughly review all the  results before contacting the office for clarification of your results.

## 2025-01-21 NOTE — ED Provider Notes (Signed)
" °  [place] [facility] AT [facility] Provider Assume Care Note I assumed care of Rickey Cruz on 01/21/2025 at 5 PM from Dr. Fredia.   Briefly, Rickey Cruz is a 64 y.o. male who: PMHx: arthritis, asthma, colon polys, GERD, kidney stones, HTN, HLD, prediabetes, prostate cancer diagnosed 3/23, s/p XRT 05/2024 at [facility] (he went because he was declined treatment here)  P/w abdominal pain in the setting of recent urinary retention, constipation     Plan at the time of handoff: Patient found to have AKI and acute urinary retention, though still passing some urine spontaneously Prior team spoke to urology who recommends placement of 18 French coud catheter and admission to medicine   Please refer to the original providers note for additional information regarding the care of Rickey Cruz.  Reassessment: Resting quietly in bed  Vital Signs:  ED Triage Vitals  Encounter Vitals Group     BP 01/21/25 1402 (!) 137/94     Girls Systolic BP Percentile --      Girls Diastolic BP Percentile --      Boys Systolic BP Percentile --      Boys Diastolic BP Percentile --      Pulse Rate 01/21/25 1402 74     Resp 01/21/25 1402 17     Temp 01/21/25 1402 97.8 F (36.6 C)     Temp Source 01/21/25 1402 Oral     SpO2 01/21/25 1402 100 %     Weight 01/21/25 1411 182 lb 15.7 oz (83 kg)     Height 01/21/25 1411 5' 5 (1.651 m)     Head Circumference --      Peak Flow --      Pain Score 01/21/25 1411 5     Pain Loc --      Pain Education --      Exclude from Growth Chart --      Hemodynamics:  The patient is hemodynamically stable. Mental Status:  The patient is alert  Additional MDM: About the time of shift handoff, urology and medicine callback, patient was mated to the medicine service with a urology consult.  Disposition: ADMIT: I believe the patient requires admission for further care and management. The patient was admitted to hospitalist with urology consult. Please  see inpatient provider note for additional treatment plan details.    FREDRIK CANDIE Later, MD Emergency Medicine    Later Rickey RAMAN, MD 01/21/25 1710  "

## 2025-01-22 DIAGNOSIS — N179 Acute kidney failure, unspecified: Secondary | ICD-10-CM | POA: Diagnosis not present

## 2025-01-22 DIAGNOSIS — E119 Type 2 diabetes mellitus without complications: Secondary | ICD-10-CM | POA: Diagnosis not present

## 2025-01-22 DIAGNOSIS — N320 Bladder-neck obstruction: Secondary | ICD-10-CM

## 2025-01-22 DIAGNOSIS — R310 Gross hematuria: Secondary | ICD-10-CM | POA: Diagnosis not present

## 2025-01-22 DIAGNOSIS — N1330 Unspecified hydronephrosis: Secondary | ICD-10-CM | POA: Diagnosis not present

## 2025-01-22 DIAGNOSIS — R319 Hematuria, unspecified: Secondary | ICD-10-CM | POA: Diagnosis not present

## 2025-01-22 DIAGNOSIS — I700 Atherosclerosis of aorta: Secondary | ICD-10-CM | POA: Insufficient documentation

## 2025-01-22 DIAGNOSIS — N400 Enlarged prostate without lower urinary tract symptoms: Secondary | ICD-10-CM | POA: Insufficient documentation

## 2025-01-22 DIAGNOSIS — N401 Enlarged prostate with lower urinary tract symptoms: Secondary | ICD-10-CM | POA: Diagnosis not present

## 2025-01-22 LAB — CBC
HCT: 38.9 % — ABNORMAL LOW (ref 39.0–52.0)
Hemoglobin: 13.6 g/dL (ref 13.0–17.0)
MCH: 30.3 pg (ref 26.0–34.0)
MCHC: 35 g/dL (ref 30.0–36.0)
MCV: 86.6 fL (ref 80.0–100.0)
Platelets: 181 10*3/uL (ref 150–400)
RBC: 4.49 MIL/uL (ref 4.22–5.81)
RDW: 12.1 % (ref 11.5–15.5)
WBC: 6 10*3/uL (ref 4.0–10.5)
nRBC: 0 % (ref 0.0–0.2)

## 2025-01-22 LAB — BASIC METABOLIC PANEL WITH GFR
Anion gap: 12 (ref 5–15)
BUN: 34 mg/dL — ABNORMAL HIGH (ref 8–23)
CO2: 24 mmol/L (ref 22–32)
Calcium: 8.6 mg/dL — ABNORMAL LOW (ref 8.9–10.3)
Chloride: 105 mmol/L (ref 98–111)
Creatinine, Ser: 2.88 mg/dL — ABNORMAL HIGH (ref 0.61–1.24)
GFR, Estimated: 24 mL/min — ABNORMAL LOW
Glucose, Bld: 124 mg/dL — ABNORMAL HIGH (ref 70–99)
Potassium: 4.2 mmol/L (ref 3.5–5.1)
Sodium: 141 mmol/L (ref 135–145)

## 2025-01-22 LAB — GLUCOSE, CAPILLARY
Glucose-Capillary: 130 mg/dL — ABNORMAL HIGH (ref 70–99)
Glucose-Capillary: 134 mg/dL — ABNORMAL HIGH (ref 70–99)
Glucose-Capillary: 121 mg/dL — ABNORMAL HIGH (ref 70–99)
Glucose-Capillary: 141 mg/dL — ABNORMAL HIGH (ref 70–99)

## 2025-01-22 LAB — HEMOGLOBIN A1C
Hgb A1c MFr Bld: 6 % — ABNORMAL HIGH (ref 4.8–5.6)
Mean Plasma Glucose: 125.5 mg/dL

## 2025-01-22 MED ORDER — CHLORHEXIDINE GLUCONATE CLOTH 2 % EX PADS
6.0000 | MEDICATED_PAD | Freq: Every day | CUTANEOUS | Status: AC
  Administered 2025-01-22 – 2025-01-31 (×10): 6 via TOPICAL

## 2025-01-22 MED ORDER — PHENAZOPYRIDINE HCL 100 MG PO TABS
100.0000 mg | ORAL_TABLET | Freq: Once | ORAL | Status: AC
  Administered 2025-01-22: 100 mg via ORAL
  Filled 2025-01-22: qty 1

## 2025-01-22 MED ORDER — SODIUM CHLORIDE 0.9 % IV SOLN: INTRAVENOUS | Status: AC

## 2025-01-22 MED ORDER — MIRABEGRON ER 25 MG PO TB24
25.0000 mg | ORAL_TABLET | Freq: Every day | ORAL | Status: AC
  Administered 2025-01-22 – 2025-01-31 (×10): 25 mg via ORAL
  Filled 2025-01-22 (×10): qty 1

## 2025-01-22 MED ORDER — BELLADONNA ALKALOIDS-OPIUM 16.2-60 MG RE SUPP
1.0000 | Freq: Once | RECTAL | Status: AC
  Administered 2025-01-22: 1 via RECTAL
  Filled 2025-01-22: qty 1

## 2025-01-22 MED ORDER — HYOSCYAMINE SULFATE 0.125 MG PO TBDP
0.1250 mg | ORAL_TABLET | ORAL | Status: DC | PRN
  Administered 2025-01-22 – 2025-01-29 (×9): 0.125 mg via SUBLINGUAL
  Filled 2025-01-22 (×16): qty 1

## 2025-01-22 NOTE — Consult Note (Signed)
 "  Urology Consult Note   Requesting Attending Physician:  Jadine Toribio SQUIBB, MD Service Providing Consult: Urology  Consulting Attending: Dr. Carolee   Reason for Consult: Bladder outlet obstruction and associated AKI  HPI: Rickey Cruz is seen in consultation for reasons noted above at the request of Jadine Toribio SQUIBB, MD. Patient is a 64 y.o. male presenting to [facility] with acute renal failure in the setting of bladder outlet obstruction.  Patient is known to our practice and has a very large prostate with intravesical intrusion.  PMH significant for chronic constipation, prostate cancer 3+3=6, s/p, HTN, GERD, and BPH.  XRT was not offered locally and he pursued this treatment with [facility].    ------------------  Assessment:   64 y.o. male with massive prostate > 200g.  Prostate cancer-s/p XRT at [facility].    Recommendations: # Severe BPH # AKI  Ongoing improvement with bladder decompression.  Trend labs.  Initial SCr 5.05-2.88  Over 200g prostate with large intravesical intrusion.  Prostate cancer-XRT was not offered here and he pursued this at [facility].  Following his radiation, the safest path forward would likely be for prostatic artery embolization.  He will review this with Dr. Carolee more in clinic.  Will still require pelvic artery mapping and referral.  Patient is understandably worried about having a Foley catheter for the foreseeable future.  He also recognizes the pattern of obstruction and understands the risk that this is placing his kidney function in.  Will continue Foley catheter through discharge for at least 1 week and consider voiding trial when he sees Dr. Carolee in clinic.  # PE  Chronic anticoagulation  Noted to be right side hydrogel material emboli on CT angio 09/19/2024.  Not clear the role that Eliquis  would play here, as this material should naturally dissolve with time.  Would prefer Eliquis  held until hematuria free for at least 48 hours.  Case and  plan discussed with Dr. Carolee  Past Medical History: Past Medical History:  Diagnosis Date   Allergy    Flonase , Patanol, Zyrtec    Arthritis    psoriasis   Asthma    seasonal, with allergies   Borderline diabetes    Cluster headache    Colon polyp 10/27/2011   Depression    denies 11/11/16; denies 11/21/18 been years   Eczema    GERD (gastroesophageal reflux disease)    History of kidney stones    x1   Hyperlipidemia    Hypertension    Low back pain    in kidney area-was told that it was related to gallstones   Prediabetes    Prostate cancer (HCC)    Psoriasis    [name]   Severe sleep apnea    h/o; had procedure and weight control, does not have to wear CPAP   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 Repeat 5 years.   CYSTOSCOPY WITH RETROGRADE PYELOGRAM, URETEROSCOPY AND STENT PLACEMENT Left 05/22/2022   Procedure: CYSTOSCOPY WITH RETROGRADE PYELOGRAM, URETEROSCOPY AND STENT PLACEMENT; TRANSURETHRAL RESECTION OF PROSTATE;  Surgeon: Selma Donnice SAUNDERS, MD;  Location: [facility];  Service: Urology;  Laterality: Left;  CYSTOSCOPY WITH RETROGRADE PYELOGRAM, URETEROSCOPY AND STENT PLACEMENT Left 06/07/2022   Procedure: CYSTOSCOPY WITH RETROGRADE PYELOGRAM, URETEROSCOPY AND STENT EXCHANGE;  Surgeon: Elisabeth Valli BIRCH, MD;  Location: [facility];  Service: Urology;  Laterality: Left;  90 MINS   CYSTOSCOPY/URETEROSCOPY/HOLMIUM LASER/STENT PLACEMENT Left 04/27/2023   Procedure: CYSTOSCOPY LEFT URETEROSCOPY, STONE EXTRACTION, LEFT URETERAL STENT PLACEMENT;  Surgeon: Cam Morene ORN, MD;  Location: [facility];   Service: Urology;  Laterality: Left;   EXTRACORPOREAL SHOCK WAVE LITHOTRIPSY Left 05/09/2022   Procedure: EXTRACORPOREAL SHOCK WAVE LITHOTRIPSY (ESWL);  Surgeon: Alvaro Hummer, MD;  [facility]  Service: Urology;  Laterality: Left;   HOLMIUM LASER APPLICATION Left 06/07/2022   Procedure: HOLMIUM LASER APPLICATION;  Surgeon: Elisabeth Valli BIRCH, MD;  Location: [facility];  Service: Urology;  Laterality: Left;   POLYPECTOMY     PROSTATE BIOPSY  10/2014   will have another in 3 months   SEPTOPLASTY  2014   TONSILLECTOMY  2014   TRANSURETHRAL RESECTION OF PROSTATE N/A 06/07/2022   Procedure: TRANSURETHRAL RESECTION OF THE PROSTATE (TURP);  Surgeon: Elisabeth Valli BIRCH, MD;  Location: [facility];  Service: Urology;  Laterality: N/A;   UVULOPALATOPHARYNGOPLASTY  2014    Medication: Current Facility-Administered Medications  Medication Dose Route Frequency Provider Last Rate Last Admin   acetaminophen  (TYLENOL ) tablet 650 mg  650 mg Oral Q6H PRN Zella, Mir M, MD       Or   acetaminophen  (TYLENOL ) suppository 650 mg  650 mg Rectal Q6H PRN Zella, Mir M, MD       albuterol  (PROVENTIL ) (2.5 MG/3ML) 0.083% nebulizer solution 2.5 mg  2.5 mg Nebulization Q2H PRN Zella, Mir M, MD       amLODipine  (NORVASC ) tablet 10 mg  10 mg Oral Daily Zella, Mir M, MD       apixaban  (ELIQUIS ) tablet 5 mg  5 mg Oral BID Utomwen, Adesuwa, RPH   5 mg at 01/21/25 2221   Chlorhexidine  Gluconate Cloth 2 % PADS 6 each  6 each Topical Daily Jadine Toribio SQUIBB, MD   6 each at 01/22/25 1220   HYDROmorphone  (DILAUDID ) injection 0.5 mg  0.5 mg Intravenous Q3H PRN Zella, Mir M, MD   0.5 mg at 01/22/25 1317   hyoscyamine  (ANASPAZ ) disintergrating tablet 0.125 mg  0.125 mg Sublingual Q4H PRN Delia Ole ORN, NP       insulin  aspart (novoLOG ) injection 0-15 Units  0-15 Units Subcutaneous TID WC Zella, Mir M, MD       insulin  aspart (novoLOG ) injection 0-5 Units  0-5 Units Subcutaneous  QHS Zella, Mir M, MD       LORazepam  (ATIVAN ) tablet 1 mg  1 mg Oral Q6H PRN Zella, Mir M, MD       mirabegron  ER (MYRBETRIQ ) tablet 25 mg  25 mg Oral Daily Goodrich, Daniel P, MD   25 mg at 01/22/25 9043   ondansetron  (ZOFRAN ) tablet 4 mg  4 mg Oral Q6H PRN Zella, Mir M, MD       Or   ondansetron  (ZOFRAN ) injection 4 mg  4 mg Intravenous Q6H PRN Zella, Mir M, MD   4 mg at 01/22/25 1013   oxyCODONE  (Oxy IR/ROXICODONE ) immediate release tablet 5 mg  5 mg Oral Q4H PRN Zella, Mir M, MD   5 mg at 01/22/25 0830   pantoprazole  (PROTONIX ) EC tablet 40 mg  40 mg Oral Daily Ikramullah, Mir M, MD   40 mg at 01/22/25 0830   rosuvastatin  (CRESTOR )  tablet 10 mg  10 mg Oral Daily Zella, Mir M, MD   10 mg at 01/22/25 0830   tamsulosin  (FLOMAX ) capsule 0.8 mg  0.8 mg Oral QPC supper Zella, Mir M, MD       traZODone  (DESYREL ) tablet 50 mg  50 mg Oral QHS PRN Zella Katha HERO, MD   50 mg at 01/21/25 2338    Allergies: Allergies[1]  Social History: Social History[2]  Family History Family History  Problem Relation Age of Onset   Stroke Mother    Asthma Sister    Colon cancer Neg Hx    Esophageal cancer Neg Hx    Stomach cancer Neg Hx    Prostate cancer Neg Hx    Diabetes Neg Hx    CAD Neg Hx    Migraines Neg Hx    Headache Neg Hx     Review of Systems  Genitourinary:  Positive for hematuria. Negative for dysuria, flank pain, frequency and urgency.     Objective   Vital signs in last 24 hours: BP (!) 125/96 (BP Location: Left Arm)   Pulse 69   Temp 97.6 F (36.4 C) (Oral)   Resp 18   Ht 5' 5 (1.651 m)   Wt 83 kg   SpO2 95%   BMI 30.45 kg/m   Physical Exam General: A&O, resting, appropriate HEENT: /AT Pulmonary: Normal work of breathing Cardiovascular: no cyanosis GU: Foley catheter in place draining clear blood-tinged urine   Most Recent Labs: Lab Results  Component Value Date   WBC 6.0 01/22/2025   HGB 13.6 01/22/2025   HCT 38.9 (L)  01/22/2025   PLT 181 01/22/2025    Lab Results  Component Value Date   NA 141 01/22/2025   K 4.2 01/22/2025   CL 105 01/22/2025   CO2 24 01/22/2025   BUN 34 (H) 01/22/2025   CREATININE 2.88 (H) 01/22/2025   CALCIUM  8.6 (L) 01/22/2025   MG 1.9 06/02/2022   PHOS 2.4 (L) 06/02/2022    Lab Results  Component Value Date   INR 1.1 ratio (H) 06/10/2010   APTT 28.5 06/10/2010     Urine Culture: @[id number] (laburin,org,[id number])@   IMAGING: CT ABDOMEN PELVIS WO CONTRAST Result Date: 01/21/2025 CLINICAL DATA:  Right-sided flank pain since December. Acute renal failure. Nausea. Dysuria. EXAM: CT ABDOMEN AND PELVIS WITHOUT CONTRAST TECHNIQUE: Multidetector CT imaging of the abdomen and pelvis was performed following the standard protocol without IV contrast. RADIATION DOSE REDUCTION: This exam was performed according to the departmental dose-optimization program which includes automated exposure control, adjustment of the mA and/or kV according to patient size and/or use of iterative reconstruction technique. COMPARISON:  12/10/2024 FINDINGS: Lower chest: A 2 mm left lower lobe pulmonary nodule on 19/5 can be presumed benign and do/does not warrant imaging follow-up per Fleischner criteria. Mild cardiomegaly. Right coronary artery calcification. Hepatobiliary: Normal liver. Cholecystectomy, without biliary ductal dilatation. Pancreas: Normal, without mass or ductal dilatation. Spleen: Normal in size, without focal abnormality. Adrenals/Urinary Tract: Normal adrenal glands. No renal calculi. Subcentimeter low-density interpolar left renal lesion is likely a cyst. No follow-up indicated. Moderate left and mild right-sided hydroureteronephrosis are similar. Followed to the level of a mildly distended urinary bladder with median lobe prostatic impression within. Mild similar bladder wall thickening. No ureteric or bladder calculi. Stomach/Bowel: Normal stomach, without wall thickening. Normal colon  and terminal ileum. Appendectomy. Normal small bowel. Vascular/Lymphatic: Aortic atherosclerosis. No abdominopelvic adenopathy. Reproductive: Moderate prostatomegaly with median lobe impression into the bladder. Other: No significant  free fluid.  No free intraperitoneal air. Musculoskeletal:   No acute osseous abnormality. IMPRESSION: No significant change since 12/10/2024. Prostatomegaly with median lobe prostatic impression. Moderate left and mild right-sided hydroureteronephrosis, followed to the level of a distended bladder. Findings likely represent outlet obstruction with secondary hydronephrosis. No urinary tract calculi. Incidental findings, including: Coronary artery atherosclerosis. Aortic Atherosclerosis (ICD10-I70.0). Electronically Signed   By: Rockey Kilts M.D.   On: 01/21/2025 16:16    ------  Ole Bourdon, NP Pager: [phone]   Please contact the urology consult pager with any further questions/concerns.     [1] No Known Allergies [2]  Social History Tobacco Use   Smoking status: Never   Smokeless tobacco: Never  Vaping Use   Vaping status: Never Used  Substance Use Topics   Alcohol use: Never    Alcohol/week: 0.0 standard drinks of alcohol   Drug use: Never   "

## 2025-01-22 NOTE — Progress Notes (Signed)
" °  Progress Note   Patient: Rickey Cruz FMW:[id number] DOB: [birth year] DOA: 01/21/2025     0 DOS: the patient was seen and examined on 01/22/2025   Brief hospital course: 64 year old man PMH including prostate cancer status postradiation seen at gastroenterology outpatient clinic, labs revealed AKI the patient was sent to the emergency department where urology advised placement of coud catheter.  Admitted for AKI secondary to BPH/obstructive uropathy.  Consultants Urology   Procedures/Events  Assessment and Plan: AKI secondary to bladder outlet obstruction with secondary hydronephrosis Bilateral hydronephrosis Prostate cancer Severe BPH Hematuria status post catheter placement Coud catheter placed in the emergency department.  CT no significant change since December.  Prostatomegaly, moderate left and mild right-sided hydroureteronephrosis  Renal function improving.  Continue fluids and check BMP in AM.  Check CBC in AM. Continue catheter for at least 1 week on discharge, voiding trial as an outpatient with Dr. Carolee  Foreign body pulmonary embolism On review of care everywhere, patient had embolism of hydrogel material seen on CT angiogram September 19, 2024.  Discussed with urology.  Will hold apixaban  given bleeding.  Diabetes mellitus type 2 Hemoglobin A1c 7.27 October 2024, 6.0 this admission Consult dietitian, diabetes coordinator, close outpatient follow-up Blood sugars mildly elevated, no need for sliding scale insulin  at this time.  Aortic atherosclerosis Coronary artery atherosclerosis    Subjective:  Feels okay today  Physical Exam: Vitals:   01/21/25 2152 01/22/25 0158 01/22/25 0548 01/22/25 1321  BP: (!) 138/97 (!) 124/90 (!) 129/93 (!) 125/96  Pulse: 83 71 64 69  Resp: 17 18 18 18   Temp: 98.9 F (37.2 C) 98.2 F (36.8 C) 98 F (36.7 C) 97.6 F (36.4 C)  TempSrc:    Oral  SpO2: 97% 94% 97% 95%  Weight:      Height:       Physical Exam Vitals  reviewed.  Constitutional:      General: He is not in acute distress.    Appearance: He is not ill-appearing or toxic-appearing.  Cardiovascular:     Rate and Rhythm: Normal rate and regular rhythm.     Heart sounds: No murmur heard. Pulmonary:     Effort: Pulmonary effort is normal. No respiratory distress.     Breath sounds: No wheezing, rhonchi or rales.  Neurological:     Mental Status: He is alert.  Psychiatric:        Mood and Affect: Mood normal.        Behavior: Behavior normal.     Data Reviewed: CBG stable Creatinine much improved, 2.88 CBC noted Hemoglobin A1c 6.0 Family Communication: none  Disposition: Status is: Inpatient Remains inpatient appropriate because: AKI     Time spent: 35 minutes  Author: Toribio Door, MD 01/22/2025 5:56 PM  For on call review [email or web address]    "

## 2025-01-22 NOTE — Hospital Course (Addendum)
 64 year old man PMH including prostate cancer status postradiation seen at gastroenterology outpatient clinic, labs revealed AKI the patient was sent to the emergency department where urology advised placement of coud catheter.  Admitted for AKI secondary to BPH/obstructive uropathy.  Consultants Urology  Nephrology  Procedures/Events

## 2025-01-23 DIAGNOSIS — N179 Acute kidney failure, unspecified: Secondary | ICD-10-CM | POA: Diagnosis not present

## 2025-01-23 DIAGNOSIS — N401 Enlarged prostate with lower urinary tract symptoms: Secondary | ICD-10-CM

## 2025-01-23 DIAGNOSIS — N1330 Unspecified hydronephrosis: Secondary | ICD-10-CM | POA: Diagnosis not present

## 2025-01-23 DIAGNOSIS — N320 Bladder-neck obstruction: Secondary | ICD-10-CM | POA: Diagnosis not present

## 2025-01-23 LAB — BASIC METABOLIC PANEL WITH GFR
Anion gap: 10 (ref 5–15)
BUN: 20 mg/dL (ref 8–23)
CO2: 25 mmol/L (ref 22–32)
Calcium: 8.8 mg/dL — ABNORMAL LOW (ref 8.9–10.3)
Chloride: 106 mmol/L (ref 98–111)
Creatinine, Ser: 1.94 mg/dL — ABNORMAL HIGH (ref 0.61–1.24)
GFR, Estimated: 38 mL/min — ABNORMAL LOW
Glucose, Bld: 133 mg/dL — ABNORMAL HIGH (ref 70–99)
Potassium: 3.8 mmol/L (ref 3.5–5.1)
Sodium: 140 mmol/L (ref 135–145)

## 2025-01-23 LAB — CBC
HCT: 38.6 % — ABNORMAL LOW (ref 39.0–52.0)
Hemoglobin: 13.2 g/dL (ref 13.0–17.0)
MCH: 30.1 pg (ref 26.0–34.0)
MCHC: 34.2 g/dL (ref 30.0–36.0)
MCV: 87.9 fL (ref 80.0–100.0)
Platelets: 169 10*3/uL (ref 150–400)
RBC: 4.39 MIL/uL (ref 4.22–5.81)
RDW: 12 % (ref 11.5–15.5)
WBC: 4.9 10*3/uL (ref 4.0–10.5)
nRBC: 0 % (ref 0.0–0.2)

## 2025-01-23 LAB — GLUCOSE, CAPILLARY
Glucose-Capillary: 110 mg/dL — ABNORMAL HIGH (ref 70–99)
Glucose-Capillary: 110 mg/dL — ABNORMAL HIGH (ref 70–99)
Glucose-Capillary: 99 mg/dL (ref 70–99)
Glucose-Capillary: 125 mg/dL — ABNORMAL HIGH (ref 70–99)

## 2025-01-23 MED ORDER — SODIUM CHLORIDE 0.9 % IV SOLN: INTRAVENOUS | Status: AC

## 2025-01-23 MED ORDER — POLYETHYLENE GLYCOL 3350 17 G PO PACK
17.0000 g | PACK | Freq: Two times a day (BID) | ORAL | Status: DC
  Administered 2025-01-23 – 2025-01-26 (×7): 17 g via ORAL
  Filled 2025-01-23 (×7): qty 1

## 2025-01-23 MED ORDER — BISACODYL 10 MG RE SUPP
10.0000 mg | Freq: Every day | RECTAL | Status: DC | PRN
  Administered 2025-01-23: 10 mg via RECTAL
  Filled 2025-01-23: qty 1

## 2025-01-23 MED ORDER — LIVING WELL WITH DIABETES BOOK
Freq: Once | Status: AC
  Filled 2025-01-23 (×2): qty 1

## 2025-01-23 MED ORDER — SENNA 8.6 MG PO TABS
2.0000 | ORAL_TABLET | Freq: Every day | ORAL | Status: AC
  Administered 2025-01-23 – 2025-01-31 (×8): 17.2 mg via ORAL
  Filled 2025-01-23 (×8): qty 2

## 2025-01-23 NOTE — Progress Notes (Signed)
" °   01/23/25 1751  TOC Brief Assessment  Insurance and Status Reviewed  Patient has primary care physician Yes  Home environment has been reviewed home with sposue  Prior level of function: independent  Prior/Current Home Services No current home services  Social Drivers of Health Review SDOH reviewed no interventions necessary  Readmission risk has been reviewed Yes  Transition of care needs no transition of care needs at this time    "

## 2025-01-23 NOTE — Plan of Care (Signed)

## 2025-01-23 NOTE — Plan of Care (Signed)
 Nutrition Education Note  RD consulted for nutrition education regarding diabetes.   Lab Results  Component Value Date   HGBA1C 6.0 (H) 01/22/2025   Patient reports he has been able to decrease his HA1C and lose weight recently and wants to continue these trends to be healthier.  Notes he usually only eats 1 meal a day, usually dinner. Also shares he drinks mostly water  but likes juice and has been getting a juice with added sugar and drinking recently.  RD provided Carbohydrate Counting for People with Diabetes handout from the Academy of [employer]etics. Discussed different food groups and their effects on blood sugar, emphasizing carbohydrate-containing foods. Provided list of carbohydrates and recommended serving sizes of common foods.   Discussed importance of controlled and consistent carbohydrate intake throughout the day. Provided examples of ways to balance meals/snacks and encouraged intake of high-fiber, whole grain complex carbohydrates.  Patient also reports some constipation issues so discussed increasing fiber rich foods and drinking plenty of fluids to assist with regular bowel movements.  Teach back method used.  Expect good compliance.  Patient is motivated to make more changes. He would like to continue following with a dietitian, will order outpatient dietitian referral.  Body mass index is 30.45 kg/m.   Current diet order is Carb Modified, patient is consuming approximately 100% of meals at this time. Labs and medications reviewed. No further nutrition interventions warranted at this time. If additional nutrition issues arise, please re-consult RD.  Trude Ned RD, LDN Contact via [employer].

## 2025-01-23 NOTE — Progress Notes (Signed)
" °  Progress Note   Patient: Rickey Cruz FMW:[id number] DOB: 21-Mar-1961 DOA: 01/21/2025     1 DOS: the patient was seen and examined on 01/23/2025   Brief hospital course: 64 year old man PMH including prostate cancer status postradiation seen at gastroenterology outpatient clinic, labs revealed AKI the patient was sent to the emergency department where urology advised placement of coud catheter.  Admitted for AKI secondary to BPH/obstructive uropathy.  Consultants Urology   Procedures/Events  Assessment and Plan: AKI secondary to bladder outlet obstruction with secondary hydronephrosis Bilateral hydronephrosis Prostate cancer Severe BPH Hematuria status post catheter placement Coud catheter placed in the emergency department.  CT no significant change since December.  Prostatomegaly, moderate left and mild right-sided hydroureteronephrosis  Kidney function continues to improve.  IV fluids continued.  Check BMP in AM. Light red urine Hand irrigate as needed.  Continue Myrbetriq  and as needed hyoscyamine .  Continue catheter for at least 1 week on discharge, voiding trial as an outpatient with Dr. Carolee   Foreign body pulmonary embolism On review of care everywhere, patient had embolism of hydrogel material seen on CT angiogram September 19, 2024.  Discussed with urology.  Will hold apixaban  given bleeding.   Prediabetes  Hemoglobin A1c 7.27 October 2024, 6.0 this admission Consult dietitian, diabetes coordinator, close outpatient follow-up Blood sugars mildly elevated, no need for sliding scale insulin  at this time.   Aortic atherosclerosis Coronary artery atherosclerosis  Constipation Bowel regimen    Subjective:  Has some lower abdominal discomfort but overall okay  Physical Exam: Vitals:   01/22/25 2132 01/22/25 2234 01/23/25 0349 01/23/25 1403  BP: (!) 147/95 (!) 134/97 (!) 135/94 (!) 130/91  Pulse: 60 62 64 70  Resp: 19  19 18   Temp: 98.5 F (36.9 C)  (!) 97.5 F  (36.4 C) 97.8 F (36.6 C)  TempSrc: Oral  Oral Oral  SpO2: 99%  97% 98%  Weight:      Height:       Physical Exam Vitals reviewed.  Constitutional:      General: He is not in acute distress.    Appearance: He is not ill-appearing or toxic-appearing.  Cardiovascular:     Rate and Rhythm: Normal rate and regular rhythm.     Heart sounds: No murmur heard. Pulmonary:     Effort: Pulmonary effort is normal. No respiratory distress.     Breath sounds: No wheezing, rhonchi or rales.  Neurological:     Mental Status: He is alert.  Psychiatric:        Mood and Affect: Mood normal.        Behavior: Behavior normal.     Data Reviewed: Urine output 4350 CBG stable Creatinine down to 1.94, BUN has normalized, potassium within normal limits Hemoglobin stable at 13.2  Family Communication: none  Disposition: Status is: Inpatient Remains inpatient appropriate because: AKI, hematuria     Time spent: 20 minutes  Author: Toribio Door, MD 01/23/2025 7:03 PM  For on call review [email or web address]    "

## 2025-01-23 NOTE — Progress Notes (Signed)
 Mobility Specialist - Progress Note   01/23/25 1153  Mobility  Activity Ambulated independently  Level of Assistance Independent after set-up  Assistive Device None  Distance Ambulated (ft) 700 ft  Range of Motion/Exercises Active  Activity Response Tolerated well  Mobility visit 1 Mobility  Mobility Specialist Start Time (ACUTE ONLY) 1138  Mobility Specialist Stop Time (ACUTE ONLY) 1153  Mobility Specialist Time Calculation (min) (ACUTE ONLY) 15 min   Pt was found in bed and agreeable to mobilize. C/o discomfort from scrotum area. At EOS returned to bed with all needs met. Call bell in reach.   Erminio Leos,  Mobility Specialist Can be reached via Secure Chat

## 2025-01-23 NOTE — Progress Notes (Signed)
 "    Subjective: Rickey Cruz was up in chair on rounds.  No acute events overnight.  He continues to experience bladder spasm and has ongoing mild hematuria.  Objective: Vital signs in last 24 hours: Temp:  [97.5 F (36.4 C)-98.5 F (36.9 C)] 97.5 F (36.4 C) (01/29 0349) Pulse Rate:  [60-69] 64 (01/29 0349) Resp:  [18-19] 19 (01/29 0349) BP: (125-147)/(94-97) 135/94 (01/29 0349) SpO2:  [95 %-99 %] 97 % (01/29 0349)  Assessment/Plan: # Severe BPH # AKI   Ongoing improvement with bladder decompression.  Trend labs.  Initial SCr 5.05-2.88-1.94   Over 200g prostate with large intravesical intrusion.  Prostate cancer-XRT was not offered here and he pursued this at [facility].  Following his radiation, the safest path forward would likely be for prostatic artery embolization.  He will review this with Dr. Carolee more in clinic.  Will still require pelvic artery mapping and referral.   Patient is understandably worried about having a Foley catheter for the foreseeable future.  He also recognizes the pattern of obstruction and understands the risk that this is placing his kidney function in.  Will continue Foley catheter through discharge for at least 1 week and consider voiding trial when he sees Dr. Carolee in clinic.  Nursing to hand irrigate as needed  #Bladder spasm Continue Myrbetriq  daily and as needed hyoscyamine .  Unfortunately patient is beginning to have dry mouth and constipation side effects while still having some spasm refractory to low-dose hyoscyamine .  We discussed that increasing the dosing of medications from this class would only serve to worsen his dry mouth and constipation, but may improve his bladder spasm.  He would like to wait at this time and work on his constipation today.  Provided senna   # PE   Chronic anticoagulation   Noted to be right side hydrogel material emboli on CT angio 09/19/2024.  Not clear the role that Eliquis  would play here, as this material should  naturally dissolve with time.  Would prefer Eliquis  held until hematuria free for at least 48 hours.  Intake/Output from previous day: 01/28 0701 - 01/29 0700 In: 3924 [P.O.:1520; I.V.:2404] Out: 4350 [Urine:4350]  Intake/Output this shift: Total I/O In: -  Out: 900 [Urine:900]  Physical Exam:  General: Alert and oriented CV: No cyanosis Lungs: equal chest rise Gu: Foley catheter in place draining light red urine without clot material.  Lab Results: Recent Labs    01/21/25 1459 01/22/25 0429 01/23/25 0413  HGB 13.5 13.6 13.2  HCT 38.8* 38.9* 38.6*   BMET Recent Labs    01/22/25 0429 01/23/25 0413  NA 141 140  K 4.2 3.8  CL 105 106  CO2 24 25  GLUCOSE 124* 133*  BUN 34* 20  CREATININE 2.88* 1.94*  CALCIUM  8.6* 8.8*  HGB 13.6 13.2  WBC 6.0 4.9     Studies/Results: CT ABDOMEN PELVIS WO CONTRAST Result Date: 01/21/2025 CLINICAL DATA:  Right-sided flank pain since December. Acute renal failure. Nausea. Dysuria. EXAM: CT ABDOMEN AND PELVIS WITHOUT CONTRAST TECHNIQUE: Multidetector CT imaging of the abdomen and pelvis was performed following the standard protocol without IV contrast. RADIATION DOSE REDUCTION: This exam was performed according to the departmental dose-optimization program which includes automated exposure control, adjustment of the mA and/or kV according to patient size and/or use of iterative reconstruction technique. COMPARISON:  12/10/2024 FINDINGS: Lower chest: A 2 mm left lower lobe pulmonary nodule on 19/5 can be presumed benign and do/does not warrant imaging follow-up per Fleischner  criteria. Mild cardiomegaly. Right coronary artery calcification. Hepatobiliary: Normal liver. Cholecystectomy, without biliary ductal dilatation. Pancreas: Normal, without mass or ductal dilatation. Spleen: Normal in size, without focal abnormality. Adrenals/Urinary Tract: Normal adrenal glands. No renal calculi. Subcentimeter low-density interpolar left renal lesion is  likely a cyst. No follow-up indicated. Moderate left and mild right-sided hydroureteronephrosis are similar. Followed to the level of a mildly distended urinary bladder with median lobe prostatic impression within. Mild similar bladder wall thickening. No ureteric or bladder calculi. Stomach/Bowel: Normal stomach, without wall thickening. Normal colon and terminal ileum. Appendectomy. Normal small bowel. Vascular/Lymphatic: Aortic atherosclerosis. No abdominopelvic adenopathy. Reproductive: Moderate prostatomegaly with median lobe impression into the bladder. Other: No significant free fluid.  No free intraperitoneal air. Musculoskeletal:   No acute osseous abnormality. IMPRESSION: No significant change since 12/10/2024. Prostatomegaly with median lobe prostatic impression. Moderate left and mild right-sided hydroureteronephrosis, followed to the level of a distended bladder. Findings likely represent outlet obstruction with secondary hydronephrosis. No urinary tract calculi. Incidental findings, including: Coronary artery atherosclerosis. Aortic Atherosclerosis (ICD10-I70.0). Electronically Signed   By: Rockey Kilts M.D.   On: 01/21/2025 16:16      LOS: 1 day   Rickey Bourdon, NP [facility] Pager: [phone]  01/23/2025, 1:02 PM  "

## 2025-01-23 NOTE — Inpatient Diabetes Management (Signed)
 Inpatient Diabetes Program Recommendations  AACE/ADA: New Consensus Statement on Inpatient Glycemic Control (2015)  Target Ranges:  Prepandial:   less than 140 mg/dL      Peak postprandial:   less than 180 mg/dL (1-2 hours)      Critically ill patients:  140 - 180 mg/dL   Lab Results  Component Value Date   GLUCAP 110 (H) 01/23/2025   HGBA1C 6.0 (H) 01/22/2025    Review of Glycemic Control  Latest Reference Range & Units 01/22/25 07:36 01/22/25 12:19 01/22/25 16:56 01/22/25 21:15 01/23/25 07:35  Glucose-Capillary 70 - 99 mg/dL 869 (H) 865 (H) 878 (H) 141 (H) 110 (H)  (H): Data is abnormally high  Diabetes history: Pre-DM Outpatient Diabetes medications: None Current orders for Inpatient glycemic control: Novolog  0-15 units TID and 0-5 units QHS  Received referral for New DM?  A1C is 6% indicating pre-DM. Ordered LWWDM booklet and attached education to exit care.  Will need follow up with PCP every 6 months.    Thank you, Wyvonna Pinal, MSN, CDCES Diabetes Coordinator Inpatient Diabetes Program [phone] (team pager from 8a-5p)

## 2025-01-24 DIAGNOSIS — N179 Acute kidney failure, unspecified: Secondary | ICD-10-CM | POA: Diagnosis not present

## 2025-01-24 DIAGNOSIS — N320 Bladder-neck obstruction: Secondary | ICD-10-CM | POA: Diagnosis not present

## 2025-01-24 DIAGNOSIS — N401 Enlarged prostate with lower urinary tract symptoms: Secondary | ICD-10-CM | POA: Diagnosis not present

## 2025-01-24 DIAGNOSIS — N1330 Unspecified hydronephrosis: Secondary | ICD-10-CM | POA: Diagnosis not present

## 2025-01-24 LAB — BASIC METABOLIC PANEL WITH GFR
Anion gap: 9 (ref 5–15)
BUN: 13 mg/dL (ref 8–23)
CO2: 26 mmol/L (ref 22–32)
Calcium: 8.5 mg/dL — ABNORMAL LOW (ref 8.9–10.3)
Chloride: 106 mmol/L (ref 98–111)
Creatinine, Ser: 1.55 mg/dL — ABNORMAL HIGH (ref 0.61–1.24)
GFR, Estimated: 50 mL/min — ABNORMAL LOW
Glucose, Bld: 102 mg/dL — ABNORMAL HIGH (ref 70–99)
Potassium: 3.3 mmol/L — ABNORMAL LOW (ref 3.5–5.1)
Sodium: 140 mmol/L (ref 135–145)

## 2025-01-24 LAB — GLUCOSE, CAPILLARY
Glucose-Capillary: 92 mg/dL (ref 70–99)
Glucose-Capillary: 128 mg/dL — ABNORMAL HIGH (ref 70–99)
Glucose-Capillary: 108 mg/dL — ABNORMAL HIGH (ref 70–99)
Glucose-Capillary: 99 mg/dL (ref 70–99)

## 2025-01-24 LAB — CBC
HCT: 34.5 % — ABNORMAL LOW (ref 39.0–52.0)
Hemoglobin: 11.9 g/dL — ABNORMAL LOW (ref 13.0–17.0)
MCH: 30.2 pg (ref 26.0–34.0)
MCHC: 34.5 g/dL (ref 30.0–36.0)
MCV: 87.6 fL (ref 80.0–100.0)
Platelets: 148 10*3/uL — ABNORMAL LOW (ref 150–400)
RBC: 3.94 MIL/uL — ABNORMAL LOW (ref 4.22–5.81)
RDW: 11.9 % (ref 11.5–15.5)
WBC: 4.5 10*3/uL (ref 4.0–10.5)
nRBC: 0 % (ref 0.0–0.2)

## 2025-01-24 MED ORDER — POTASSIUM CHLORIDE CRYS ER 20 MEQ PO TBCR
40.0000 meq | EXTENDED_RELEASE_TABLET | Freq: Once | ORAL | Status: AC
  Administered 2025-01-24: 40 meq via ORAL
  Filled 2025-01-24: qty 2

## 2025-01-24 NOTE — Progress Notes (Signed)
 Pt. Given part of the second soap suds enema of today, and has this RN stop in the middle of it and Pt. Went  to the bathroom to have a bowel movement and asked that this RN come back and give the remainder of the enema after he rests for awhile.

## 2025-01-24 NOTE — Progress Notes (Addendum)
Patient given soap suds enema

## 2025-01-24 NOTE — Progress Notes (Signed)
" °  Progress Note   Patient: Rickey Cruz FMW:[id number] DOB: 07/22/1961 DOA: 01/21/2025     2 DOS: the patient was seen and examined on 01/24/2025   Brief hospital course: 64 year old man PMH including prostate cancer status postradiation seen at gastroenterology outpatient clinic, labs revealed AKI the patient was sent to the emergency department where urology advised placement of coud catheter.  Admitted for AKI secondary to BPH/obstructive uropathy.  Consultants Urology   Procedures/Events  Assessment and Plan: AKI secondary to bladder outlet obstruction with secondary hydronephrosis Bilateral hydronephrosis Prostate cancer Severe BPH Hematuria status post catheter placement Coud catheter placed in the emergency department.  CT no significant change since December.  Prostatomegaly, moderate left and mild right-sided hydroureteronephrosis  Hand irrigate as needed.  Continue Myrbetriq  and as needed hyoscyamine .  Continue catheter for at least 1 week on discharge, voiding trial as an outpatient with Dr. Carolee As per urology, will remain hospitalized with plan for pelvic arterial mapping and discuss prostatic artery embolization with interventional radiology while patient remains in hospital.    Foreign body pulmonary embolism On review of care everywhere, patient had embolism of hydrogel material seen on CT angiogram September 19, 2024.  Discussed with urology.  Will hold apixaban  given bleeding.   Prediabetes  Hemoglobin A1c 7.27 October 2024, 6.0 this admission Consult dietitian, diabetes coordinator, close outpatient follow-up Blood sugars mildly elevated, no need for sliding scale insulin  at this time.   Aortic atherosclerosis Coronary artery atherosclerosis   Constipation Bowel regimen      Subjective:  Continues to feel constipated  Physical Exam: Vitals:   01/23/25 0349 01/23/25 1403 01/23/25 1959 01/24/25 0505  BP: (!) 135/94 (!) 130/91 (!) 123/90 119/83  Pulse:  64 70 79 (!) 54  Resp: 19 18 19 17   Temp: (!) 97.5 F (36.4 C) 97.8 F (36.6 C) 97.8 F (36.6 C) 98 F (36.7 C)  TempSrc: Oral Oral Oral Oral  SpO2: 97% 98% 97% 98%  Weight:      Height:       Physical Exam Vitals reviewed.  Constitutional:      General: He is not in acute distress.    Appearance: He is not ill-appearing or toxic-appearing.  Cardiovascular:     Rate and Rhythm: Normal rate and regular rhythm.     Heart sounds: No murmur heard. Pulmonary:     Effort: Pulmonary effort is normal. No respiratory distress.     Breath sounds: No wheezing, rhonchi or rales.  Neurological:     Mental Status: He is alert.  Psychiatric:        Mood and Affect: Mood normal.        Behavior: Behavior normal.     Data Reviewed: Potassium 3.3 Creatinine down to 1.55 Hemoglobin stable 11.9  Family Communication: none  Disposition: Status is: Inpatient Remains inpatient appropriate because: as per urology      Time spent: 35 minutes  Author: Toribio Door, MD 01/24/2025 10:34 AM  For on call review [email or web address].    "

## 2025-01-24 NOTE — Progress Notes (Signed)
" ° °  °  Subjective: Resting comfortable bed.  Some progress with laxative plus enema yesterday but reports he is still experiencing constipation and consistent bladder spasm.  Hematuria has nearly resolved.  Objective: Vital signs in last 24 hours: Temp:  [97.8 F (36.6 C)-98 F (36.7 C)] 98 F (36.7 C) (01/30 0505) Pulse Rate:  [54-79] 54 (01/30 0505) Resp:  [17-19] 17 (01/30 0505) BP: (119-130)/(83-91) 119/83 (01/30 0505) SpO2:  [97 %-98 %] 98 % (01/30 0505)  Assessment/Plan: # Severe BPH # AKI # Constipation # gross hematuria   Ongoing improvement with bladder decompression.  Trend labs.  Initial SCr 5.05-2.88-1.94-1.55   Over 200g prostate with large intravesical intrusion.  Prostate cancer-XRT was not offered here and he pursued this at [facility].  Following his radiation, the safest path forward would likely be for prostatic artery embolization.  We planed to pursue this on an outpatient basis but he will likely necessitate a catheter for some time and is struggling with significant bladder spasm.  Small amounts of anticholinergics are causing dry mouth and exacerbating constipation per his report. I do not believe he will be able to tolerate a Foley catheter for months while he gets set up with our [facility] providers, makes a scheduled appointment, and then waits for several weeks for prostate shrinkage before voiding trial.  Would plan to keep him over the weekend and after another day or 2 of improvement with renal function, proceed with pelvic arterial mapping and discuss prostatic artery embolization with interventional radiology while patient remains in hospital.  His sensation of constipation is largely pressure from his prostate on the rectum.  Reviewing his CT A/P from 1/27.  He does not have a large amount of colonic stool burden and not really in the pelvis.  Hematuria has largely cleared.  Light pink prior to hand irrigation this morning.    Nursing to hand irrigate as  needed  #Bladder spasm  Continue Myrbetriq  daily and as needed hyoscyamine .  Low-dose is helping somewhat but not relieving symptoms.  Patient is having dry mouth and constipation and shared decision made not to increase his dosing.  As soon as he has completed his bowel regimen would prefer to switch to B&O suppositories   # PE   Chronic anticoagulation   Noted to be right side hydrogel material emboli on CT angio 09/19/2024.  Not clear the role that Eliquis  would play here, as this material should naturally dissolve with time.  Would prefer Eliquis  held until hematuria free for at least 48 hours.  Intake/Output from previous day: 01/29 0701 - 01/30 0700 In: 1547.4 [P.O.:100; I.V.:927.4] Out: 4895 [Urine:4895]  Intake/Output this shift: Total I/O In: -  Out: 750 [Urine:750]  Physical Exam:  General: Alert and oriented CV: No cyanosis Lungs: equal chest rise Gu: Foley catheter in place draining light pink urine without clot material.  Lab Results: Recent Labs    01/22/25 0429 01/23/25 0413 01/24/25 0435  HGB 13.6 13.2 11.9*  HCT 38.9* 38.6* 34.5*   BMET Recent Labs    01/23/25 0413 01/24/25 0435  NA 140 140  K 3.8 3.3*  CL 106 106  CO2 25 26  GLUCOSE 133* 102*  BUN 20 13  CREATININE 1.94* 1.55*  CALCIUM  8.8* 8.5*  HGB 13.2 11.9*  WBC 4.9 4.5     Studies/Results: No results found.     LOS: 2 days   Ole Bourdon, NP [facility] Pager: [phone]  01/24/2025, 8:56 AM  "

## 2025-01-24 NOTE — Progress Notes (Signed)
 Pt. Given remainder of the second soap suds enema today and went to the bathroom and had an additional bowel movement.

## 2025-01-25 DIAGNOSIS — N401 Enlarged prostate with lower urinary tract symptoms: Secondary | ICD-10-CM | POA: Diagnosis not present

## 2025-01-25 DIAGNOSIS — N400 Enlarged prostate without lower urinary tract symptoms: Secondary | ICD-10-CM

## 2025-01-25 DIAGNOSIS — N179 Acute kidney failure, unspecified: Secondary | ICD-10-CM | POA: Diagnosis not present

## 2025-01-25 DIAGNOSIS — R310 Gross hematuria: Secondary | ICD-10-CM

## 2025-01-25 DIAGNOSIS — K5900 Constipation, unspecified: Secondary | ICD-10-CM

## 2025-01-25 DIAGNOSIS — N320 Bladder-neck obstruction: Secondary | ICD-10-CM | POA: Diagnosis not present

## 2025-01-25 DIAGNOSIS — N1330 Unspecified hydronephrosis: Secondary | ICD-10-CM | POA: Diagnosis not present

## 2025-01-25 LAB — CBC
HCT: 34.9 % — ABNORMAL LOW (ref 39.0–52.0)
Hemoglobin: 11.8 g/dL — ABNORMAL LOW (ref 13.0–17.0)
MCH: 29.8 pg (ref 26.0–34.0)
MCHC: 33.8 g/dL (ref 30.0–36.0)
MCV: 88.1 fL (ref 80.0–100.0)
Platelets: 173 10*3/uL (ref 150–400)
RBC: 3.96 MIL/uL — ABNORMAL LOW (ref 4.22–5.81)
RDW: 11.9 % (ref 11.5–15.5)
WBC: 4.1 10*3/uL (ref 4.0–10.5)
nRBC: 0 % (ref 0.0–0.2)

## 2025-01-25 LAB — GLUCOSE, CAPILLARY
Glucose-Capillary: 131 mg/dL — ABNORMAL HIGH (ref 70–99)
Glucose-Capillary: 94 mg/dL (ref 70–99)
Glucose-Capillary: 117 mg/dL — ABNORMAL HIGH (ref 70–99)
Glucose-Capillary: 126 mg/dL — ABNORMAL HIGH (ref 70–99)

## 2025-01-25 LAB — BASIC METABOLIC PANEL WITH GFR
Anion gap: 10 (ref 5–15)
BUN: 10 mg/dL (ref 8–23)
CO2: 26 mmol/L (ref 22–32)
Calcium: 8.5 mg/dL — ABNORMAL LOW (ref 8.9–10.3)
Chloride: 105 mmol/L (ref 98–111)
Creatinine, Ser: 1.5 mg/dL — ABNORMAL HIGH (ref 0.61–1.24)
GFR, Estimated: 52 mL/min — ABNORMAL LOW
Glucose, Bld: 104 mg/dL — ABNORMAL HIGH (ref 70–99)
Potassium: 3.1 mmol/L — ABNORMAL LOW (ref 3.5–5.1)
Sodium: 141 mmol/L (ref 135–145)

## 2025-01-25 LAB — TISSUE TRANSGLUTAMINASE ABS,IGG,IGA
(tTG) Ab, IgA: <1 U/mL
(tTG) Ab, IgG: <1 U/mL

## 2025-01-25 LAB — IGA: Immunoglobulin A: 187 mg/dL (ref 70–320)

## 2025-01-25 MED ORDER — BISACODYL 5 MG PO TBEC
5.0000 mg | DELAYED_RELEASE_TABLET | Freq: Once | ORAL | Status: AC
  Administered 2025-01-25: 5 mg via ORAL
  Filled 2025-01-25: qty 1

## 2025-01-25 MED ORDER — LACTATED RINGERS IV SOLN: INTRAVENOUS | Status: AC

## 2025-01-25 MED ORDER — POTASSIUM CHLORIDE CRYS ER 20 MEQ PO TBCR
40.0000 meq | EXTENDED_RELEASE_TABLET | ORAL | Status: AC
  Administered 2025-01-25 (×2): 40 meq via ORAL
  Filled 2025-01-25 (×2): qty 2

## 2025-01-25 NOTE — Progress Notes (Signed)
 Urology Inpatient Progress Note  Subjective: Doing well this AM.  Still having problems with constipation despite enema yesterday. Urine is light pink today. Bladder spasms are controlled.  Anti-infectives: Anti-infectives (From admission, onward)    None       Current Facility-Administered Medications  Medication Dose Route Frequency Provider Last Rate Last Admin   acetaminophen  (TYLENOL ) tablet 650 mg  650 mg Oral Q6H PRN Zella, Mir M, MD       Or   acetaminophen  (TYLENOL ) suppository 650 mg  650 mg Rectal Q6H PRN Zella, Mir M, MD       albuterol  (PROVENTIL ) (2.5 MG/3ML) 0.083% nebulizer solution 2.5 mg  2.5 mg Nebulization Q2H PRN Zella, Mir M, MD       amLODipine  (NORVASC ) tablet 10 mg  10 mg Oral Daily Ikramullah, Mir M, MD   10 mg at 01/24/25 9178   bisacodyl  (DULCOLAX) suppository 10 mg  10 mg Rectal Daily PRN Jadine Toribio SQUIBB, MD   10 mg at 01/23/25 1717   Chlorhexidine  Gluconate Cloth 2 % PADS 6 each  6 each Topical Daily Jadine Toribio SQUIBB, MD   6 each at 01/24/25 1337   HYDROmorphone  (DILAUDID ) injection 0.5 mg  0.5 mg Intravenous Q3H PRN Zella, Mir M, MD   0.5 mg at 01/25/25 0732   hyoscyamine  (ANASPAZ ) disintergrating tablet 0.125 mg  0.125 mg Sublingual Q4H PRN Delia Ole ORN, NP   0.125 mg at 01/24/25 9178   insulin  aspart (novoLOG ) injection 0-15 Units  0-15 Units Subcutaneous TID WC Zella, Mir M, MD       insulin  aspart (novoLOG ) injection 0-5 Units  0-5 Units Subcutaneous QHS Zella, Mir M, MD       lactated ringers  infusion   Intravenous Continuous Jadine Toribio SQUIBB, MD       LORazepam  (ATIVAN ) tablet 1 mg  1 mg Oral Q6H PRN Zella, Mir M, MD       mirabegron  ER (MYRBETRIQ ) tablet 25 mg  25 mg Oral Daily Goodrich, Daniel P, MD   25 mg at 01/24/25 9141   ondansetron  (ZOFRAN ) tablet 4 mg  4 mg Oral Q6H PRN Zella, Mir M, MD       Or   ondansetron  (ZOFRAN ) injection 4 mg  4 mg Intravenous Q6H PRN Zella, Mir M, MD    4 mg at 01/23/25 0459   oxyCODONE  (Oxy IR/ROXICODONE ) immediate release tablet 5 mg  5 mg Oral Q4H PRN Zella, Mir M, MD   5 mg at 01/23/25 2047   pantoprazole  (PROTONIX ) EC tablet 40 mg  40 mg Oral Daily Ikramullah, Mir M, MD   40 mg at 01/24/25 0821   polyethylene glycol (MIRALAX  / GLYCOLAX ) packet 17 g  17 g Oral BID Jadine Toribio SQUIBB, MD   17 g at 01/24/25 2206   potassium chloride  SA (KLOR-CON  M) CR tablet 40 mEq  40 mEq Oral Q4H Jadine Toribio SQUIBB, MD       rosuvastatin  (CRESTOR ) tablet 10 mg  10 mg Oral Daily Ikramullah, Mir M, MD   10 mg at 01/24/25 9178   senna (SENOKOT) tablet 17.2 mg  2 tablet Oral Daily Delia Ole ORN, NP   17.2 mg at 01/24/25 9178   tamsulosin  (FLOMAX ) capsule 0.8 mg  0.8 mg Oral QPC supper Zella, Mir M, MD   0.8 mg at 01/24/25 1810   traZODone  (DESYREL ) tablet 50 mg  50 mg Oral QHS PRN Zella Katha HERO, MD   50 mg at 01/23/25 2318  Objective: Vital signs in last 24 hours: Temp:  [98.1 F (36.7 C)-99.1 F (37.3 C)] 99.1 F (37.3 C) (01/31 0453) Pulse Rate:  [60-74] 69 (01/31 0453) Resp:  [18-19] 18 (01/31 0453) BP: (122-138)/(83-90) 123/86 (01/31 0453) SpO2:  [98 %-99 %] 98 % (01/31 0453)  Intake/Output from previous day: 01/30 0701 - 01/31 0700 In: 580 [P.O.:120] Out: 4985 [Urine:4985] Intake/Output this shift: Total I/O In: -  Out: 400 [Urine:400]  GENERAL APPEARANCE:  Well appearing, well developed, well nourished, NAD ABDOMEN:  Soft, non-tender, no masses EXTREMITIES:  Moves all extremities well, without clubbing, cyanosis, or edema NEUROLOGIC:  Alert and oriented x 3, normal gait, CN II-XII grossly intact MENTAL STATUS:  appropriate SKIN:  Warm, dry, and intact GU:  foley in place draining pink urine, no clots   Lab Results:  Recent Labs    01/24/25 0435 01/25/25 0414  WBC 4.5 4.1  HGB 11.9* 11.8*  HCT 34.5* 34.9*  PLT 148* 173   BMET Recent Labs    01/24/25 0435 01/25/25 0414  NA 140 141  K 3.3* 3.1*   CL 106 105  CO2 26 26  GLUCOSE 102* 104*  BUN 13 10  CREATININE 1.55* 1.50*  CALCIUM  8.5* 8.5*   PT/INR No results for input(s): LABPROT, INR in the last 72 hours. ABG No results for input(s): PHART, HCO3 in the last 72 hours.  Invalid input(s): PCO2, PO2  Studies/Results: No results found.   Assessment & Plan: Large volume BPH Gross hematuria AKI Constipation  Continue foley catheter Continue management of bladder spasms with Myrbetriq  and prn hyoscyamine  Hand irrigate foley prn Monitor renal function with plan to proceed with pelvic arterial mapping for possible PAE by IR   Adine Manly, MD 01/25/2025

## 2025-01-25 NOTE — Plan of Care (Signed)

## 2025-01-25 NOTE — Progress Notes (Signed)
" °  Progress Note   Patient: Rickey Cruz FMW:[id number] DOB: March 29, 1961 DOA: 01/21/2025     3 DOS: the patient was seen and examined on 01/25/2025   Brief hospital course: 64 year old man PMH including prostate cancer status postradiation seen at gastroenterology outpatient clinic, labs revealed AKI the patient was sent to the emergency department where urology advised placement of coud catheter.  Admitted for AKI secondary to BPH/obstructive uropathy.  Consultants Urology   Procedures/Events  Assessment and Plan: AKI secondary to bladder outlet obstruction with secondary hydronephrosis Bilateral hydronephrosis Prostate cancer Severe BPH Gross hematuria status post catheter placement Coud catheter placed in the emergency department.  CT no significant change since December.  Prostatomegaly, moderate left and mild right-sided hydroureteronephrosis  Hand irrigate as needed.  Continue Myrbetriq  and as needed hyoscyamine .  Continue catheter for at least 1 week on discharge, voiding trial as an outpatient with Dr. Carolee As per urology, will remain hospitalized with plan for pelvic arterial mapping and discuss prostatic artery embolization with interventional radiology while patient remains in hospital.    Foreign body pulmonary embolism On review of care everywhere, patient had embolism of hydrogel material seen on CT angiogram September 19, 2024.  Discussed with urology.  Will hold apixaban  given bleeding.   Prediabetes  Hemoglobin A1c 7.27 October 2024, 6.0 this admission Consult dietitian, diabetes coordinator, close outpatient follow-up Blood sugars mildly elevated, no need for sliding scale insulin  at this time.   Aortic atherosclerosis Coronary artery atherosclerosis   Constipation Bowel regimen       Subjective:  Feels okay today.  Thinks he still might be constipated.  Physical Exam: Vitals:   01/24/25 1400 01/24/25 2008 01/25/25 0453 01/25/25 1435  BP: 122/83 (!)  138/90 123/86 114/86  Pulse: 60 74 69 75  Resp: 18 19 18 14   Temp: 98.6 F (37 C) 98.1 F (36.7 C) 99.1 F (37.3 C) 98.9 F (37.2 C)  TempSrc: Oral Oral Oral Oral  SpO2: 99% 98% 98% 96%  Weight:      Height:       Physical Exam Vitals reviewed.  Constitutional:      General: He is not in acute distress.    Appearance: He is not ill-appearing or toxic-appearing.  Cardiovascular:     Rate and Rhythm: Normal rate and regular rhythm.     Heart sounds: No murmur heard. Pulmonary:     Effort: Pulmonary effort is normal. No respiratory distress.     Breath sounds: No wheezing, rhonchi or rales.  Neurological:     Mental Status: He is alert.  Psychiatric:        Mood and Affect: Mood normal.        Behavior: Behavior normal.     Data Reviewed: Urine output 4985 Creatinine down to 1.5 Potassium 3.1 Hemoglobin stable 11.8  Family Communication:   Disposition: Status is: Inpatient Remains inpatient appropriate because: Urology requested inpatient, plan for pelvic arterial mapping for possible prostate artery embolism by IR     Time spent: 20 minutes  Author: Toribio Door, MD 01/25/2025 4:29 PM  For on call review [email or web address].    "

## 2025-01-26 DIAGNOSIS — N179 Acute kidney failure, unspecified: Secondary | ICD-10-CM | POA: Diagnosis not present

## 2025-01-26 DIAGNOSIS — N1330 Unspecified hydronephrosis: Secondary | ICD-10-CM | POA: Diagnosis not present

## 2025-01-26 DIAGNOSIS — N401 Enlarged prostate with lower urinary tract symptoms: Secondary | ICD-10-CM | POA: Diagnosis not present

## 2025-01-26 DIAGNOSIS — R310 Gross hematuria: Secondary | ICD-10-CM | POA: Diagnosis not present

## 2025-01-26 DIAGNOSIS — N320 Bladder-neck obstruction: Secondary | ICD-10-CM | POA: Diagnosis not present

## 2025-01-26 LAB — GLUCOSE, CAPILLARY
Glucose-Capillary: 109 mg/dL — ABNORMAL HIGH (ref 70–99)
Glucose-Capillary: 117 mg/dL — ABNORMAL HIGH (ref 70–99)
Glucose-Capillary: 158 mg/dL — ABNORMAL HIGH (ref 70–99)
Glucose-Capillary: 105 mg/dL — ABNORMAL HIGH (ref 70–99)

## 2025-01-26 LAB — BASIC METABOLIC PANEL WITH GFR
Anion gap: 13 (ref 5–15)
BUN: 11 mg/dL (ref 8–23)
CO2: 25 mmol/L (ref 22–32)
Calcium: 8.9 mg/dL (ref 8.9–10.3)
Chloride: 102 mmol/L (ref 98–111)
Creatinine, Ser: 1.59 mg/dL — ABNORMAL HIGH (ref 0.61–1.24)
GFR, Estimated: 48 mL/min — ABNORMAL LOW
Glucose, Bld: 158 mg/dL — ABNORMAL HIGH (ref 70–99)
Potassium: 3.3 mmol/L — ABNORMAL LOW (ref 3.5–5.1)
Sodium: 140 mmol/L (ref 135–145)

## 2025-01-26 MED ORDER — BISACODYL 10 MG RE SUPP
10.0000 mg | Freq: Every day | RECTAL | Status: AC | PRN
  Filled 2025-01-26: qty 1

## 2025-01-26 MED ORDER — POLYETHYLENE GLYCOL 3350 17 GM/SCOOP PO POWD
119.0000 g | Freq: Once | ORAL | Status: AC
  Administered 2025-01-26: 119 g via ORAL
  Filled 2025-01-26: qty 119

## 2025-01-26 MED ORDER — SMOG ENEMA
300.0000 mL | Freq: Once | RECTAL | Status: AC
  Administered 2025-01-26: 300 mL via RECTAL
  Filled 2025-01-26: qty 960

## 2025-01-26 MED ORDER — LACTULOSE 10 GM/15ML PO SOLN
20.0000 g | Freq: Once | ORAL | Status: AC
  Administered 2025-01-26: 20 g via ORAL
  Filled 2025-01-26: qty 30

## 2025-01-26 MED ORDER — POTASSIUM CHLORIDE CRYS ER 20 MEQ PO TBCR
40.0000 meq | EXTENDED_RELEASE_TABLET | Freq: Once | ORAL | Status: AC
  Administered 2025-01-26: 40 meq via ORAL
  Filled 2025-01-26: qty 2

## 2025-01-26 NOTE — Plan of Care (Signed)

## 2025-01-27 ENCOUNTER — Inpatient Hospital Stay (HOSPITAL_COMMUNITY)

## 2025-01-27 DIAGNOSIS — N320 Bladder-neck obstruction: Secondary | ICD-10-CM | POA: Diagnosis not present

## 2025-01-27 DIAGNOSIS — N1330 Unspecified hydronephrosis: Secondary | ICD-10-CM | POA: Diagnosis not present

## 2025-01-27 DIAGNOSIS — N179 Acute kidney failure, unspecified: Secondary | ICD-10-CM | POA: Diagnosis not present

## 2025-01-27 DIAGNOSIS — N401 Enlarged prostate with lower urinary tract symptoms: Secondary | ICD-10-CM | POA: Diagnosis not present

## 2025-01-27 LAB — CBC
HCT: 36.6 % — ABNORMAL LOW (ref 39.0–52.0)
Hemoglobin: 13 g/dL (ref 13.0–17.0)
MCH: 30.2 pg (ref 26.0–34.0)
MCHC: 35.5 g/dL (ref 30.0–36.0)
MCV: 84.9 fL (ref 80.0–100.0)
Platelets: 215 10*3/uL (ref 150–400)
RBC: 4.31 MIL/uL (ref 4.22–5.81)
RDW: 11.9 % (ref 11.5–15.5)
WBC: 4.1 10*3/uL (ref 4.0–10.5)
nRBC: 0 % (ref 0.0–0.2)

## 2025-01-27 LAB — BASIC METABOLIC PANEL WITH GFR
Anion gap: 9 (ref 5–15)
BUN: 10 mg/dL (ref 8–23)
CO2: 27 mmol/L (ref 22–32)
Calcium: 8.9 mg/dL (ref 8.9–10.3)
Chloride: 104 mmol/L (ref 98–111)
Creatinine, Ser: 1.58 mg/dL — ABNORMAL HIGH (ref 0.61–1.24)
GFR, Estimated: 49 mL/min — ABNORMAL LOW
Glucose, Bld: 120 mg/dL — ABNORMAL HIGH (ref 70–99)
Potassium: 3.4 mmol/L — ABNORMAL LOW (ref 3.5–5.1)
Sodium: 140 mmol/L (ref 135–145)

## 2025-01-27 LAB — GLUCOSE, CAPILLARY
Glucose-Capillary: 114 mg/dL — ABNORMAL HIGH (ref 70–99)
Glucose-Capillary: 96 mg/dL (ref 70–99)
Glucose-Capillary: 149 mg/dL — ABNORMAL HIGH (ref 70–99)
Glucose-Capillary: 130 mg/dL — ABNORMAL HIGH (ref 70–99)

## 2025-01-27 MED ORDER — ENSURE PLUS HIGH PROTEIN PO LIQD
237.0000 mL | Freq: Two times a day (BID) | ORAL | Status: AC
  Administered 2025-01-28: 237 mL via ORAL

## 2025-01-27 MED ORDER — ENOXAPARIN SODIUM 40 MG/0.4ML IJ SOSY
40.0000 mg | PREFILLED_SYRINGE | INTRAMUSCULAR | Status: DC
  Administered 2025-01-27 – 2025-01-28 (×2): 40 mg via SUBCUTANEOUS
  Filled 2025-01-27 (×2): qty 0.4

## 2025-01-27 MED ORDER — POLYETHYLENE GLYCOL 3350 17 G PO PACK
17.0000 g | PACK | Freq: Two times a day (BID) | ORAL | Status: AC
  Administered 2025-01-27 – 2025-01-31 (×8): 17 g via ORAL
  Filled 2025-01-27 (×8): qty 1

## 2025-01-27 NOTE — Progress Notes (Signed)
" °  Progress Note   Patient: Rickey Cruz FMW:[id number] DOB: 1961-07-07 DOA: 01/21/2025     5 DOS: the patient was seen and examined on 01/27/2025   Brief hospital course: 64 year old man PMH including prostate cancer status postradiation seen at gastroenterology outpatient clinic, labs revealed AKI the patient was sent to the emergency department where urology advised placement of coud catheter.  Admitted for AKI secondary to BPH/obstructive uropathy.  Consultants Urology  Nephrology  Procedures/Events  Assessment and Plan: AKI secondary to bladder outlet obstruction with secondary hydronephrosis Bilateral hydronephrosis Prostate cancer Severe BPH Gross hematuria status post catheter placement Coud catheter placed in the emergency department.  CT no significant change since December.  Prostatomegaly, moderate left and mild right-sided hydroureteronephrosis  Hand irrigate as needed.  Continue Myrbetriq  and as needed hyoscyamine .  Continue catheter for at least 1 week on discharge, voiding trial as an outpatient with Dr. Carolee As per urology, will remain hospitalized with plan for pelvic arterial mapping and discuss prostatic artery embolization with interventional radiology while patient remains in hospital.  Hematuria resolved.  IR consulted by urology.   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  However whether this was hydrogel or organic is disputed in the chart.  Discussed with urology.  Will hold apixaban  given bleeding. Start prophylactic Lovenox .  Suggest resuming anticoagulation after definitive management per urology.   Prediabetes  Hemoglobin A1c 7.27 October 2024, 6.0 this admission Consulted dietitian, diabetes coordinator, close outpatient follow-up Blood sugars mildly elevated, no need for sliding scale insulin  at this time.   Aortic  atherosclerosis Coronary artery atherosclerosis   Constipation X-ray shows little stool.  Sensation of constipation secondary to prostate.  Continue bowel regimen      Subjective:  Continues to feel like he has constipation  Physical Exam: Vitals:   01/26/25 1431 01/26/25 2118 01/27/25 0400 01/27/25 1510  BP: 126/84 126/86 116/88 123/89  Pulse: 78 68 63 68  Resp: 18 18 18 18   Temp: 98.2 F (36.8 C) 98.8 F (37.1 C) 98.5 F (36.9 C) (!) 97.5 F (36.4 C)  TempSrc: Oral Oral Oral Oral  SpO2: 99% 94% 98% 95%  Weight:      Height:       Physical Exam Vitals reviewed.  Constitutional:      General: He is not in acute distress.    Appearance: He is not ill-appearing or toxic-appearing.  Cardiovascular:     Rate and Rhythm: Normal rate and regular rhythm.     Heart sounds: No murmur heard. Pulmonary:     Effort: Pulmonary effort is normal. No respiratory distress.     Breath sounds: No wheezing, rhonchi or rales.  Neurological:     Mental Status: He is alert.  Psychiatric:        Mood and Affect: Mood normal.        Behavior: Behavior normal.   Urine is yellow  Data Reviewed: Urine output 3500 CBG stable Potassium 3.4 Creatinine stable 1.58, no significant improvement since 1/30 CBC unremarkable  Family Communication:   Disposition: Status is: Inpatient Remains inpatient appropriate because: Per urology     Time spent: 25 nephrology minutes  Author: Toribio Door, MD 01/27/2025 4:36 PM  For on call review [email or web address]    "

## 2025-01-27 NOTE — Plan of Care (Signed)
  Problem: Education: Goal: Knowledge of General Education information will improve Description: Including pain rating scale, medication(s)/side effects and non-pharmacologic comfort measures Outcome: Progressing   Problem: Health Behavior/Discharge Planning: Goal: Ability to manage health-related needs will improve Outcome: Progressing   Problem: Clinical Measurements: Goal: Ability to maintain clinical measurements within normal limits will improve Outcome: Progressing Goal: Will remain free from infection Outcome: Progressing Goal: Diagnostic test results will improve Outcome: Progressing Goal: Respiratory complications will improve Outcome: Progressing Goal: Cardiovascular complication will be avoided Outcome: Progressing   Problem: Activity: Goal: Risk for activity intolerance will decrease Outcome: Progressing   Problem: Nutrition: Goal: Adequate nutrition will be maintained Outcome: Progressing   Problem: Coping: Goal: Level of anxiety will decrease Outcome: Progressing   Problem: Elimination: Goal: Will not experience complications related to bowel motility Outcome: Progressing Goal: Will not experience complications related to urinary retention Outcome: Progressing   Problem: Pain Managment: Goal: General experience of comfort will improve and/or be controlled Outcome: Progressing   Problem: Safety: Goal: Ability to remain free from injury will improve Outcome: Progressing   Problem: Skin Integrity: Goal: Risk for impaired skin integrity will decrease Outcome: Progressing   Problem: Coping: Goal: Ability to adjust to condition or change in health will improve Outcome: Progressing   Problem: Fluid Volume: Goal: Ability to maintain a balanced intake and output will improve Outcome: Progressing   Problem: Metabolic: Goal: Ability to maintain appropriate glucose levels will improve Outcome: Progressing   Problem: Nutritional: Goal: Maintenance of  adequate nutrition will improve Outcome: Progressing Goal: Progress toward achieving an optimal weight will improve Outcome: Progressing   Problem: Skin Integrity: Goal: Risk for impaired skin integrity will decrease Outcome: Progressing   Problem: Tissue Perfusion: Goal: Adequacy of tissue perfusion will improve Outcome: Progressing

## 2025-01-28 ENCOUNTER — Ambulatory Visit (HOSPITAL_COMMUNITY)

## 2025-01-28 ENCOUNTER — Inpatient Hospital Stay (HOSPITAL_COMMUNITY)

## 2025-01-28 ENCOUNTER — Encounter (HOSPITAL_COMMUNITY): Payer: Self-pay | Admitting: Internal Medicine

## 2025-01-28 LAB — PSA: Prostatic Specific Antigen: 1.58 ng/mL (ref 0.00–4.00)

## 2025-01-28 LAB — GLUCOSE, CAPILLARY
Glucose-Capillary: 107 mg/dL — ABNORMAL HIGH (ref 70–99)
Glucose-Capillary: 131 mg/dL — ABNORMAL HIGH (ref 70–99)
Glucose-Capillary: 120 mg/dL — ABNORMAL HIGH (ref 70–99)
Glucose-Capillary: 122 mg/dL — ABNORMAL HIGH (ref 70–99)

## 2025-01-28 LAB — BASIC METABOLIC PANEL WITH GFR
Anion gap: 10 (ref 5–15)
BUN: 16 mg/dL (ref 8–23)
CO2: 27 mmol/L (ref 22–32)
Calcium: 8.8 mg/dL — ABNORMAL LOW (ref 8.9–10.3)
Chloride: 101 mmol/L (ref 98–111)
Creatinine, Ser: 1.74 mg/dL — ABNORMAL HIGH (ref 0.61–1.24)
GFR, Estimated: 44 mL/min — ABNORMAL LOW
Glucose, Bld: 109 mg/dL — ABNORMAL HIGH (ref 70–99)
Potassium: 3.3 mmol/L — ABNORMAL LOW (ref 3.5–5.1)
Sodium: 138 mmol/L (ref 135–145)

## 2025-01-28 LAB — PROTIME-INR
INR: 1.1 (ref 0.8–1.2)
Prothrombin Time: 14.8 s (ref 11.4–15.2)

## 2025-01-28 MED ORDER — POTASSIUM CHLORIDE CRYS ER 20 MEQ PO TBCR
40.0000 meq | EXTENDED_RELEASE_TABLET | Freq: Once | ORAL | Status: AC
  Administered 2025-01-28: 40 meq via ORAL
  Filled 2025-01-28: qty 2

## 2025-01-28 MED ORDER — SODIUM CHLORIDE 0.9 % IV SOLN
2.0000 g | Freq: Once | INTRAVENOUS | Status: AC
  Administered 2025-01-29: 2 g via INTRAVENOUS
  Filled 2025-01-28: qty 20

## 2025-01-28 MED ORDER — SODIUM CHLORIDE 0.9 % IV BOLUS
1000.0000 mL | Freq: Once | INTRAVENOUS | Status: AC
  Administered 2025-01-28: 1000 mL via INTRAVENOUS

## 2025-01-28 MED ORDER — LACTATED RINGERS IV SOLN: INTRAVENOUS | Status: AC

## 2025-01-28 MED ORDER — SODIUM CHLORIDE 0.9 % IV BOLUS: 1000.0000 mL | Freq: Once | INTRAVENOUS | Status: AC

## 2025-01-28 MED ORDER — IOHEXOL 350 MG/ML SOLN
75.0000 mL | Freq: Once | INTRAVENOUS | Status: AC | PRN
  Administered 2025-01-28: 75 mL via INTRAVENOUS

## 2025-01-28 NOTE — Progress Notes (Signed)
 " Progress Note   Patient: Rickey Cruz FMW:[id number] DOB: Sep 29, 1961 DOA: 01/21/2025     6 DOS: the patient was seen and examined on 01/28/2025   Brief hospital course: 64 year old man PMH including prostate cancer status postradiation seen at gastroenterology outpatient clinic, labs revealed AKI the patient was sent to the emergency department where urology advised placement of coud catheter.  Admitted for AKI secondary to BPH/obstructive uropathy.  Renal function slowly improved but did not return to baseline.  Seen by nephrology with recommendation for outpatient follow-up.  Had hematuria and therefore anticoagulation was stopped but would recommend resuming after procedure.  Followed by urology, recommends keeping catheter in place, obtain pelvic arterial mapping and proceed with pelvic artery embolization prior to discharge.  Consultants Urology  Nephrology  Procedures/Events  Assessment and Plan: AKI secondary to bladder outlet obstruction with secondary hydronephrosis Bilateral hydronephrosis Prostate cancer Severe BPH Gross hematuria status post catheter placement Coud catheter placed in the emergency department.  CT no significant change since December.  Prostatomegaly, moderate left and mild right-sided hydroureteronephrosis  Hand irrigate as needed.  Continue Myrbetriq  and as needed hyoscyamine .  Continue catheter for at least 1 week on discharge, voiding trial as an outpatient with Dr. Carolee As per urology, will remain hospitalized with plan for pelvic arterial mapping and discuss prostatic artery embolization with interventional radiology while patient remains in hospital.  Nephrology discussed with urology and interventional radiology.  Risk of serious injury to kidneys is felt to be low and therefore we will proceed with imaging and procedure.   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  However whether this was hydrogel or organic is disputed in the chart.  Discussed with urology.  Will hold apixaban  given bleeding. Suggest resuming anticoagulation after definitive management per urology and follow-up as an outpatient.   Prediabetes  Hemoglobin A1c 7.27 October 2024, 6.0 this admission Consulted dietitian, diabetes coordinator, close outpatient follow-up Blood sugars mildly elevated, no need for sliding scale insulin  at this time.   Aortic atherosclerosis Coronary artery atherosclerosis   Constipation Resolved.      Subjective:  Feels better today, bowels moved after enema.  No sensation of constipation.  Bladder spasms less intense.  Physical Exam: Vitals:   01/27/25 1510 01/27/25 1958 01/28/25 0429 01/28/25 1336  BP: 123/89 115/78 107/71 112/78  Pulse: 68 74 (!) 57 84  Resp: 18 16 19 18   Temp: (!) 97.5 F (36.4 C) 98.6 F (37 C) 98.3 F (36.8 C) 98.5 F (36.9 C)  TempSrc: Oral Oral  Oral  SpO2: 95% 96% 96% 91%  Weight:      Height:       Physical Exam Vitals reviewed.  Constitutional:      General: He is not in acute distress.    Appearance: He is not ill-appearing or toxic-appearing.  Cardiovascular:     Rate and Rhythm: Normal rate and regular rhythm.     Heart sounds: No murmur heard. Pulmonary:     Effort: Pulmonary effort is normal. No respiratory distress.     Breath sounds: No wheezing, rhonchi or rales.  Genitourinary:    Comments: Urine clear yellow Neurological:     Mental Status: He is alert.  Psychiatric:        Mood and Affect: Mood normal.        Behavior: Behavior normal.     Data Reviewed: CBG  stable Potassium 3.3 Creatinine up to 1.74  Family Communication: Wife at bedside  Disposition: Status is: Inpatient Remains inpatient appropriate because: Needs pelvic arterial mapping and pelvic artery embolization.     Time spent: 20 minutes  Author: Toribio Door, MD 01/28/2025 4:19  PM  For on call review [email or web address]    "

## 2025-01-28 NOTE — Plan of Care (Signed)
" °  Problem: Education: Goal: Knowledge of General Education information will improve Description: Including pain rating scale, medication(s)/side effects and non-pharmacologic comfort measures Outcome: Progressing   Problem: Health Behavior/Discharge Planning: Goal: Ability to manage health-related needs will improve Outcome: Progressing   Problem: Clinical Measurements: Goal: Will remain free from infection Outcome: Progressing Goal: Respiratory complications will improve Outcome: Progressing Goal: Cardiovascular complication will be avoided Outcome: Progressing   Problem: Activity: Goal: Risk for activity intolerance will decrease Outcome: Progressing   Problem: Nutrition: Goal: Adequate nutrition will be maintained Outcome: Progressing   Problem: Coping: Goal: Level of anxiety will decrease Outcome: Progressing   Problem: Elimination: Goal: Will not experience complications related to bowel motility Outcome: Progressing Goal: Will not experience complications related to urinary retention Outcome: Progressing   Problem: Pain Managment: Goal: General experience of comfort will improve and/or be controlled Outcome: Progressing   Problem: Safety: Goal: Ability to remain free from injury will improve Outcome: Progressing   Problem: Skin Integrity: Goal: Risk for impaired skin integrity will decrease Outcome: Progressing   Problem: Education: Goal: Ability to describe self-care measures that may prevent or decrease complications (Diabetes Survival Skills Education) will improve Outcome: Progressing   Problem: Coping: Goal: Ability to adjust to condition or change in health will improve Outcome: Progressing   Problem: Health Behavior/Discharge Planning: Goal: Ability to manage health-related needs will improve Outcome: Progressing   Problem: Metabolic: Goal: Ability to maintain appropriate glucose levels will improve Outcome: Progressing   Problem:  Nutritional: Goal: Maintenance of adequate nutrition will improve Outcome: Progressing   Problem: Skin Integrity: Goal: Risk for impaired skin integrity will decrease Outcome: Progressing   Problem: Tissue Perfusion: Goal: Adequacy of tissue perfusion will improve Outcome: Progressing   "

## 2025-01-29 ENCOUNTER — Inpatient Hospital Stay (HOSPITAL_COMMUNITY)

## 2025-01-29 LAB — BASIC METABOLIC PANEL WITH GFR
Anion gap: 10 (ref 5–15)
BUN: 13 mg/dL (ref 8–23)
CO2: 26 mmol/L (ref 22–32)
Calcium: 9 mg/dL (ref 8.9–10.3)
Chloride: 105 mmol/L (ref 98–111)
Creatinine, Ser: 1.48 mg/dL — ABNORMAL HIGH (ref 0.61–1.24)
GFR, Estimated: 53 mL/min — ABNORMAL LOW
Glucose, Bld: 122 mg/dL — ABNORMAL HIGH (ref 70–99)
Potassium: 3.4 mmol/L — ABNORMAL LOW (ref 3.5–5.1)
Sodium: 141 mmol/L (ref 135–145)

## 2025-01-29 LAB — GLUCOSE, CAPILLARY
Glucose-Capillary: 100 mg/dL — ABNORMAL HIGH (ref 70–99)
Glucose-Capillary: 103 mg/dL — ABNORMAL HIGH (ref 70–99)
Glucose-Capillary: 107 mg/dL — ABNORMAL HIGH (ref 70–99)

## 2025-01-29 MED ORDER — FREE WATER
500.0000 mL | Freq: Once | Status: AC
  Administered 2025-01-29: 500 mL via ORAL

## 2025-01-29 MED ORDER — FENTANYL CITRATE (PF) 100 MCG/2ML IJ SOLN
INTRAMUSCULAR | Status: AC | PRN
  Administered 2025-01-29: 50 ug via INTRAVENOUS

## 2025-01-29 MED ORDER — NITROGLYCERIN IN D5W 100-5 MCG/ML-% IV SOLN
INTRAVENOUS | Status: AC
  Filled 2025-01-29: qty 250

## 2025-01-29 MED ORDER — SODIUM CHLORIDE 0.9% FLUSH: 3.0000 mL | INTRAVENOUS | Status: AC | PRN

## 2025-01-29 MED ORDER — MIDAZOLAM HCL (PF) 2 MG/2ML IJ SOLN
INTRAMUSCULAR | Status: AC | PRN
  Administered 2025-01-29: 1 mg via INTRAVENOUS

## 2025-01-29 MED ORDER — VERAPAMIL HCL 2.5 MG/ML IV SOLN: INTRA_ARTERIAL | Status: AC | PRN

## 2025-01-29 MED ORDER — FENTANYL CITRATE (PF) 100 MCG/2ML IJ SOLN
INTRAMUSCULAR | Status: AC
  Filled 2025-01-29: qty 2

## 2025-01-29 MED ORDER — LIDOCAINE HCL (PF) 1 % IJ SOLN
INTRAMUSCULAR | Status: AC
  Filled 2025-01-29: qty 30

## 2025-01-29 MED ORDER — HEPARIN SODIUM (PORCINE) 1000 UNIT/ML IJ SOLN
INTRAMUSCULAR | Status: AC
  Filled 2025-01-29: qty 10

## 2025-01-29 MED ORDER — MIDAZOLAM HCL 2 MG/2ML IJ SOLN
INTRAMUSCULAR | Status: AC
  Filled 2025-01-29: qty 2

## 2025-01-29 MED ORDER — POTASSIUM CHLORIDE CRYS ER 20 MEQ PO TBCR
40.0000 meq | EXTENDED_RELEASE_TABLET | Freq: Once | ORAL | Status: AC
  Administered 2025-01-29: 40 meq via ORAL
  Filled 2025-01-29: qty 2

## 2025-01-29 MED ORDER — VERAPAMIL HCL 2.5 MG/ML IV SOLN
INTRAVENOUS | Status: AC
  Filled 2025-01-29: qty 2

## 2025-01-29 MED ORDER — NITROGLYCERIN 1 MG/10 ML FOR IR/CATH LAB
INTRA_ARTERIAL | Status: AC | PRN
  Administered 2025-01-29 (×2): 200 ug via INTRA_ARTERIAL

## 2025-01-29 MED ORDER — HYOSCYAMINE SULFATE 0.125 MG SL SUBL
0.1250 mg | SUBLINGUAL_TABLET | SUBLINGUAL | Status: DC | PRN
  Administered 2025-01-30 – 2025-01-31 (×2): 0.125 mg via SUBLINGUAL
  Filled 2025-01-29 (×2): qty 1

## 2025-01-29 MED ORDER — SODIUM CHLORIDE 0.9 % IV SOLN
INTRAVENOUS | Status: AC | PRN
  Administered 2025-01-29: 2 g via INTRAVENOUS

## 2025-01-29 MED ORDER — SODIUM CHLORIDE 0.9 % IV SOLN
INTRAVENOUS | Status: AC
  Filled 2025-01-29: qty 20

## 2025-01-29 MED ORDER — IOHEXOL 300 MG/ML  SOLN
250.0000 mL | Freq: Once | INTRAMUSCULAR | Status: AC | PRN
  Administered 2025-01-29: 150 mL via INTRA_ARTERIAL

## 2025-01-29 MED ORDER — MELATONIN 5 MG PO TABS
5.0000 mg | ORAL_TABLET | Freq: Every evening | ORAL | Status: DC | PRN
  Administered 2025-01-30 (×2): 5 mg via ORAL
  Filled 2025-01-29 (×2): qty 1

## 2025-01-29 MED ORDER — LIDOCAINE HCL (PF) 1 % IJ SOLN
20.0000 mL | Freq: Once | INTRAMUSCULAR | Status: AC
  Administered 2025-01-29: 1 mL via INTRADERMAL
  Filled 2025-01-29: qty 20

## 2025-01-29 NOTE — Progress Notes (Signed)
 "    Subjective: Resting comfortable bed.  Some progress with laxative plus enema yesterday but reports he is still experiencing constipation and consistent bladder spasm.  Hematuria has nearly resolved.  Objective: Vital signs in last 24 hours: Temp:  [97.9 F (36.6 C)-98.5 F (36.9 C)] 98.1 F (36.7 C) (02/04 0440) Pulse Rate:  [61-84] 61 (02/04 0440) Resp:  [17-18] 17 (02/04 0440) BP: (112-121)/(78-85) 121/80 (02/04 0440) SpO2:  [91 %-100 %] 100 % (02/04 0440)  Assessment/Plan: # Severe BPH # CKD # Constipation-resolved # gross hematuria-resolved   Over 200g prostate with large intravesical intrusion.  Prostate cancer-XRT was not offered here and he pursued this at [facility]    Tolerated pelvic arterial mapping well.  Interval improvement in serum creatinine from 1.74-1.48 in spite of IV contrast.  His sensation of constipation is largely pressure from his prostate on the rectum.  Reviewing his CT A/P from 1/27.  He does not have a large amount of colonic/pelvic stool burden.  This is largely resolved and he feels that he is having normal bowel movements.  Hematuria resolved.  Clear yellow urine.  He is likely to start bleeding again once his anticoagulants are restarted.  Shared decision to proceed with prostatic artery embolization with interventional radiology today.  Okay to trial anticoagulants once patient has been free of hematuria for 48 hours.  Patient to discharge with Foley catheter and consider voiding trial after catheter exchange at 30-day mark, on or about 02/21/2025.  He will be contacted from clinic to schedule follow-up appointment.    #Bladder spasm  Continue Myrbetriq  daily and as needed hyoscyamine .  Bladder spasm has significantly improved.  Hold Myrbetriq  7 to 10 days prior to voiding trial.   # PE   Chronic anticoagulation   Noted to be right side hydrogel material emboli on CT angio 09/19/2024.  Not clear the role that Eliquis  would play here, as this  material should naturally dissolve with time.  Would prefer Eliquis  held until hematuria free for at least 48 hours.  Consider alternative to trazodone .  This is our #1 cause of drug-induced priapism, even in patients that he previously tolerated it well.  Intake/Output from previous day: 02/03 0701 - 02/04 0700 In: 729.2 [I.V.:707.5; IV Piggyback:21.7] Out: 2850 [Urine:2850]  Intake/Output this shift: No intake/output data recorded.  Physical Exam:  General: Alert and oriented CV: No cyanosis Lungs: equal chest rise Gu: Foley catheter in place draining clear yellow urine  Lab Results: Recent Labs    01/27/25 0350  HGB 13.0  HCT 36.6*   BMET Recent Labs    01/27/25 0350 01/28/25 0405 01/29/25 0422  NA 140 138 141  K 3.4* 3.3* 3.4*  CL 104 101 105  CO2 27 27 26   GLUCOSE 120* 109* 122*  BUN 10 16 13   CREATININE 1.58* 1.74* 1.48*  CALCIUM  8.9 8.8* 9.0  HGB 13.0  --   --   WBC 4.1  --   --      Studies/Results: CT ANGIO PELVIS W OR WO CONTRAST Result Date: 01/28/2025 CLINICAL DATA:  Road mapping study prior to prostate artery embolization EXAM: CT ANGIOGRAPHY PELVIS WITHOUT AND CONTRAST TECHNIQUE: Multidetector CT imaging of the pelvis was performed using the standard protocol before and following the bolus administration of intravenous contrast. Multiplanar image (3D post-processing) reconstructions and MIPs were obtained to evaluate the pelvic vascular anatomy. RADIATION DOSE REDUCTION: This exam was performed according to the departmental dose-optimization program which includes automated exposure control, adjustment of  the mA and/or kV according to patient size and/or use of iterative reconstruction technique. CONTRAST:  75mL OMNIPAQUE  IOHEXOL  350 MG/ML SOLN COMPARISON:  CT AP, 07/14/2024 and 12/10/2024. FINDINGS: VASCULAR; Pelvis: *Widely patent aortic bifurcation, hypogastric, external iliac and common femoral arteries. *Internal iliac artery branching with division  into the superior gluteal artery and gluteal pudendal trunk bilaterally (Yamaki group A). *Patent BILATERAL prostatic arteries, seemingly from the anterior division of the internal iliac artery/gluteal pudendal trunks bilaterally (type II). NONVASCULAR; Urinary Tract: Foley catheter in place. No distal ureteral or urinary bladder distention. Relative urinary bladder wall enhancement. No discrete intra vesicular contrast extravasation Bowel: Appendectomy. Imaged portions of distal small bowel, colon and rectum are nondilated. Lymphatic: No enlarged abdominal or pelvic lymph nodes. Reproductive: Prostatomegaly, measuring approximately 5.5 x 5.0 x 8.5 cm, with a calculated volume of 117 mL Other:  No pelvic ascites. Musculoskeletal: No suspicious bone lesion. IMPRESSION: 1. Prostatomegaly, measuring approximately 117 g in volume. 2. BILATERAL internal iliac artery branching into the superior gluteal artery and gluteal pudendal trunk (Yamaki group A) 3. Patent BILATERAL prostatic arteries, arising from the gluteal pudendal trunks bilaterally (type II). Electronically Signed   By: Thom Hall M.D.   On: 01/28/2025 16:37   DG Abd Portable 1V Result Date: 01/27/2025 CLINICAL DATA:  Abdominal pain, assess stool burden. EXAM: PORTABLE ABDOMEN - 1 VIEW COMPARISON:  01/21/2025. FINDINGS: No small bowel or colonic dilatation. Gas is seen in the colon with minimal scattered stool. Surgical clips in the right abdomen. Visualized lung bases are clear. IMPRESSION: No acute findings. Electronically Signed   By: Newell Eke M.D.   On: 01/27/2025 09:27       LOS: 7 days   Rickey Bourdon, NP [facility] Pager: [phone]  01/29/2025, 8:29 AM  "

## 2025-01-29 NOTE — Procedures (Signed)
 Vascular and Interventional Radiology Procedure Note  Patient: Rickey Cruz DOB: 1961-10-25 Medical Record Number: [id number] Note Date/Time: 01/29/25 3:48 PM   Performing Physician: Thom Hall, MD Assistant(s): None  Diagnosis: BPH w LUTS and Hematuria  Procedure(s):  PELVIC ARTERIOGRAPHY PROSTATE ARTERY EMBOLIZATION   Anesthesia: Conscious Sedation Complications: None Estimated Blood Loss: Minimal Specimens: None  Findings:  - access via the LEFT radial artery. - tortuous prostatic arteries. - Successful embolization with 100-300 um and 300-500 um microparticles to near stasis, with additional coil embolization of the prostatic artery origins. - TR band closure at L wrist at the end of the case  Plan: - Post sheath removal precautions.  - Standard post radial access deflation protocol.   Final report to follow once all images are reviewed and compared with previous studies.  See detailed dictation with images in PACS. The patient tolerated the procedure well without incident or complication and was returned to Recovery in stable condition.    Thom Hall, MD [facility]   Pager. [phone] Clinic. [phone]

## 2025-01-29 NOTE — Progress Notes (Signed)
 "        [facility], is a 64 y.o. male, DOB - 01-10-1961, FMW:[id number] Admit date - 01/21/2025    Outpatient Primary MD for the patient is Sagardia, Emil Schanz, MD  LOS - 7  days    Brief summary   64 year old man PMH including prostate cancer status postradiation seen at gastroenterology outpatient clinic, labs revealed AKI the patient was sent to the emergency department where urology advised placement of coud catheter. Admitted for AKI secondary to BPH/obstructive uropathy. Renal function slowly improved but did not return to baseline. Seen by nephrology with recommendation for outpatient follow-up. Had hematuria and therefore anticoagulation was stopped but would recommend resuming after procedure. Followed by urology, recommends keeping catheter in place, obtain pelvic arterial mapping and proceed with pelvic artery embolization prior to discharge.   Assessment & Plan    Assessment and Plan:  Gross hematuria s/p catheter placement. In the setting of prostatomegaly and moderate right and left hydroureteronephrosis Urology consulted recommended to continue with catheter on discharge and plan for pelvic artery embolization prior to discharge.   Acute kidney injury secondary to bladder outlet obstruction with secondary hydroureteronephrosis from severe BPH/prostate cancer. Creatinine continues to improve currently at 1.48 today   Hypokalemia Replaced   Essential hypertension Blood pressure parameters are optimal this morning   Foreign body pulmonary embolism versus organic PE On review of care everywhere, patient had embolism of hydrogel material seen on CT angiogram September 19, 2024.  However whether this was hydrogel or organic is disputed in the chart.  Discussed with urology.  Will hold apixaban  given bleeding. Suggest resuming anticoagulation after definitive management  per urology and follow-up as an outpatient.  \Type 2 diabetes mellitus Hemoglobin A1c November 2025 Diet controlled.  Constipation Resolved     Estimated body mass index is 30.45 kg/m as calculated from the following:   Height as of this encounter: 5' 5 (1.651 m).   Weight as of this encounter: 83 kg.  Code Status: full code DVT Prophylaxis:  Place and maintain sequential compression device Start: 01/23/25 1909   Level of Care: Level of care: Med-Surg Family Communication: family at bedside.   Disposition Plan:     Remains inpatient appropriate:  pending   Procedures:  None.   Consultants:   Urology IR   Antimicrobials:   Anti-infectives (From admission, onward)    Start     Dose/Rate Route Frequency Ordered Stop   01/29/25 1558  cefTRIAXone  (ROCEPHIN ) 2 g in sodium chloride  0.9 % 100 mL IVPB        over 30 Minutes Intravenous Continuous PRN 01/29/25 1606     01/29/25 1300  cefTRIAXone  (ROCEPHIN ) 2 g in sodium chloride  0.9 % 100 mL IVPB        2 g 200 mL/hr over 30 Minutes Intravenous  Once 01/28/25 1655 01/29/25 1349        Medications  Scheduled Meds:  amLODipine   10 mg Oral Daily   Chlorhexidine  Gluconate Cloth  6 each Topical Daily  feeding supplement  237 mL Oral BID BM   insulin  aspart  0-15 Units Subcutaneous TID WC   insulin  aspart  0-5 Units Subcutaneous QHS   lidocaine  (PF)  20 mL Intradermal Once   mirabegron  ER  25 mg Oral Daily   pantoprazole   40 mg Oral Daily   polyethylene glycol  17 g Oral BID   rosuvastatin   10 mg Oral Daily   senna  2 tablet Oral Daily   tamsulosin   0.8 mg Oral QPC supper   Continuous Infusions:  cefTRIAXone  (ROCEPHIN ) 2 g in sodium chloride  0.9 % 100 mL IVPB     sodium chloride      PRN Meds:.acetaminophen  **OR** acetaminophen , albuterol , bisacodyl , cefTRIAXone  (ROCEPHIN ) 2 g in sodium chloride  0.9 % 100 mL IVPB, fentaNYL , fentaNYL , HYDROmorphone  (DILAUDID ) injection, hyoscyamine , iohexol , LORazepam , midazolam   PF, midazolam  PF, midazolam  PF, nitroGLYCERIN , ondansetron  **OR** ondansetron  (ZOFRAN ) IV, oxyCODONE , Radial Cocktail (nitroglycerin /verapamil /heparin ) for IR    Subjective:   Rickey Cruz was seen and examined today.  No nausea, vomiting or abdominal pain. Agreeable to holding the trazodone .   Objective:   Vitals:   01/29/25 1625 01/29/25 1630 01/29/25 1635 01/29/25 1640  BP: 109/77 114/82 112/78 108/78  Pulse: 62 64 65 62  Resp: 11 13 14 13   Temp:      TempSrc:      SpO2: 95% 95% 95% 96%  Weight:      Height:        Intake/Output Summary (Last 24 hours) at 01/29/2025 1641 Last data filed at 01/29/2025 1600 Gross per 24 hour  Intake 200 ml  Output 3675 ml  Net -3475 ml   Filed Weights   01/21/25 1411  Weight: 83 kg     Exam General: Alert and oriented x 3, NAD Cardiovascular: S1 S2 auscultated, no murmurs, RRR Respiratory: Clear to auscultation bilaterally, no wheezing, rales or rhonchi Gastrointestinal: Soft, nontender, nondistended, + bowel sounds Ext: no pedal edema bilaterally Neuro: AAOx3, Cr N's II- XII. Strength 5/5 upper and lower extremities bilaterally Skin: No rashes Psych: Normal affect and demeanor, alert and oriented x3    Data Reviewed:  I have personally reviewed following labs and imaging studies   CBC Lab Results  Component Value Date   WBC 4.1 01/27/2025   RBC 4.31 01/27/2025   HGB 13.0 01/27/2025   HCT 36.6 (L) 01/27/2025   MCV 84.9 01/27/2025   MCH 30.2 01/27/2025   PLT 215 01/27/2025   MCHC 35.5 01/27/2025   RDW 11.9 01/27/2025   LYMPHSABS 0.6 (L) 01/21/2025   MONOABS 0.4 01/21/2025   EOSABS 0.1 01/21/2025   BASOSABS 0.0 01/21/2025     Last metabolic panel Lab Results  Component Value Date   NA 141 01/29/2025   K 3.4 (L) 01/29/2025   CL 105 01/29/2025   CO2 26 01/29/2025   BUN 13 01/29/2025   CREATININE 1.48 (H) 01/29/2025   GLUCOSE 122 (H) 01/29/2025   GFRNONAA 53 (L) 01/29/2025   GFRAA 91 11/12/2020   CALCIUM  9.0  01/29/2025   PHOS 2.4 (L) 06/02/2022   PROT 7.2 01/21/2025   ALBUMIN 4.4 01/21/2025   LABGLOB 2.3 06/05/2020   AGRATIO 1.9 06/05/2020   BILITOT 0.4 01/21/2025   ALKPHOS 63 01/21/2025   AST 30 01/21/2025   ALT 33 01/21/2025   ANIONGAP 10 01/29/2025    CBG (last 3)  Recent Labs    01/28/25 2016 01/29/25 0734 01/29/25 1141  GLUCAP 122* 100* 103*      Coagulation Profile: Recent  Labs  Lab 01/28/25 0405  INR 1.1     Radiology Studies: CT ANGIO PELVIS W OR WO CONTRAST Result Date: 01/28/2025 CLINICAL DATA:  Road mapping study prior to prostate artery embolization EXAM: CT ANGIOGRAPHY PELVIS WITHOUT AND CONTRAST TECHNIQUE: Multidetector CT imaging of the pelvis was performed using the standard protocol before and following the bolus administration of intravenous contrast. Multiplanar image (3D post-processing) reconstructions and MIPs were obtained to evaluate the pelvic vascular anatomy. RADIATION DOSE REDUCTION: This exam was performed according to the departmental dose-optimization program which includes automated exposure control, adjustment of the mA and/or kV according to patient size and/or use of iterative reconstruction technique. CONTRAST:  75mL OMNIPAQUE  IOHEXOL  350 MG/ML SOLN COMPARISON:  CT AP, 07/14/2024 and 12/10/2024. FINDINGS: VASCULAR; Pelvis: *Widely patent aortic bifurcation, hypogastric, external iliac and common femoral arteries. *Internal iliac artery branching with division into the superior gluteal artery and gluteal pudendal trunk bilaterally (Yamaki group A). *Patent BILATERAL prostatic arteries, seemingly from the anterior division of the internal iliac artery/gluteal pudendal trunks bilaterally (type II). NONVASCULAR; Urinary Tract: Foley catheter in place. No distal ureteral or urinary bladder distention. Relative urinary bladder wall enhancement. No discrete intra vesicular contrast extravasation Bowel: Appendectomy. Imaged portions of distal small bowel, colon  and rectum are nondilated. Lymphatic: No enlarged abdominal or pelvic lymph nodes. Reproductive: Prostatomegaly, measuring approximately 5.5 x 5.0 x 8.5 cm, with a calculated volume of 117 mL Other:  No pelvic ascites. Musculoskeletal: No suspicious bone lesion. IMPRESSION: 1. Prostatomegaly, measuring approximately 117 g in volume. 2. BILATERAL internal iliac artery branching into the superior gluteal artery and gluteal pudendal trunk (Yamaki group A) 3. Patent BILATERAL prostatic arteries, arising from the gluteal pudendal trunks bilaterally (type II). Electronically Signed   By: Thom Hall M.D.   On: 01/28/2025 16:37       Elgie Butter M.D. [facility] 01/29/2025, 4:41 PM  Available via Epic secure chat 7am-7pm After 7 pm, please refer to night coverage provider listed on amion.    "

## 2025-01-30 LAB — CBC WITH DIFFERENTIAL/PLATELET
Abs Immature Granulocytes: 0.04 10*3/uL (ref 0.00–0.07)
Basophils Absolute: 0 10*3/uL (ref 0.0–0.1)
Basophils Relative: 0 %
Eosinophils Absolute: 0 10*3/uL (ref 0.0–0.5)
Eosinophils Relative: 1 %
HCT: 38.9 % — ABNORMAL LOW (ref 39.0–52.0)
Hemoglobin: 13.5 g/dL (ref 13.0–17.0)
Immature Granulocytes: 1 %
Lymphocytes Relative: 7 %
Lymphs Abs: 0.5 10*3/uL — ABNORMAL LOW (ref 0.7–4.0)
MCH: 30 pg (ref 26.0–34.0)
MCHC: 34.7 g/dL (ref 30.0–36.0)
MCV: 86.4 fL (ref 80.0–100.0)
Monocytes Absolute: 0.5 10*3/uL (ref 0.1–1.0)
Monocytes Relative: 7 %
Neutro Abs: 5.8 10*3/uL (ref 1.7–7.7)
Neutrophils Relative %: 84 %
Platelets: 228 10*3/uL (ref 150–400)
RBC: 4.5 MIL/uL (ref 4.22–5.81)
RDW: 12 % (ref 11.5–15.5)
WBC: 6.9 10*3/uL (ref 4.0–10.5)
nRBC: 0 % (ref 0.0–0.2)

## 2025-01-30 LAB — MAGNESIUM: Magnesium: 1.6 mg/dL — ABNORMAL LOW (ref 1.7–2.4)

## 2025-01-30 LAB — BASIC METABOLIC PANEL WITH GFR
Anion gap: 15 (ref 5–15)
BUN: 10 mg/dL (ref 8–23)
CO2: 23 mmol/L (ref 22–32)
Calcium: 8.9 mg/dL (ref 8.9–10.3)
Chloride: 101 mmol/L (ref 98–111)
Creatinine, Ser: 1.3 mg/dL — ABNORMAL HIGH (ref 0.61–1.24)
GFR, Estimated: >60 mL/min
Glucose, Bld: 122 mg/dL — ABNORMAL HIGH (ref 70–99)
Potassium: 3.2 mmol/L — ABNORMAL LOW (ref 3.5–5.1)
Sodium: 138 mmol/L (ref 135–145)

## 2025-01-30 LAB — GLUCOSE, CAPILLARY
Glucose-Capillary: 115 mg/dL — ABNORMAL HIGH (ref 70–99)
Glucose-Capillary: 120 mg/dL — ABNORMAL HIGH (ref 70–99)
Glucose-Capillary: 116 mg/dL — ABNORMAL HIGH (ref 70–99)
Glucose-Capillary: 202 mg/dL — ABNORMAL HIGH (ref 70–99)

## 2025-01-30 MED ORDER — OXYCODONE HCL 5 MG PO TABS: 10.0000 mg | ORAL_TABLET | ORAL | Status: DC | PRN

## 2025-01-30 MED ORDER — HYDROMORPHONE HCL 1 MG/ML IJ SOLN
0.5000 mg | INTRAMUSCULAR | Status: AC | PRN
  Administered 2025-01-30 – 2025-01-31 (×3): 0.5 mg via INTRAVENOUS
  Filled 2025-01-30 (×3): qty 0.5

## 2025-01-30 MED ORDER — PREDNISONE 5 MG (21) PO TBPK
5.0000 mg | ORAL_TABLET | Freq: Three times a day (TID) | ORAL | Status: AC
  Administered 2025-01-31 (×3): 5 mg via ORAL

## 2025-01-30 MED ORDER — PREDNISONE 5 MG (21) PO TBPK
10.0000 mg | ORAL_TABLET | Freq: Every evening | ORAL | Status: AC
  Administered 2025-01-30: 10 mg via ORAL

## 2025-01-30 MED ORDER — PREDNISONE 5 MG (21) PO TBPK
5.0000 mg | ORAL_TABLET | ORAL | Status: AC
  Administered 2025-01-30: 5 mg via ORAL

## 2025-01-30 MED ORDER — PREDNISONE 5 MG (21) PO TBPK: 5.0000 mg | ORAL_TABLET | Freq: Four times a day (QID) | ORAL | Status: AC

## 2025-01-30 MED ORDER — HYDROCODONE-ACETAMINOPHEN 5-325 MG PO TABS
1.0000 | ORAL_TABLET | ORAL | Status: AC | PRN
  Administered 2025-01-30 – 2025-01-31 (×4): 2 via ORAL
  Filled 2025-01-30 (×4): qty 2
  Filled 2025-01-30: qty 1

## 2025-01-30 MED ORDER — POTASSIUM CHLORIDE CRYS ER 20 MEQ PO TBCR
40.0000 meq | EXTENDED_RELEASE_TABLET | Freq: Two times a day (BID) | ORAL | Status: AC
  Administered 2025-01-30 – 2025-01-31 (×4): 40 meq via ORAL
  Filled 2025-01-30 (×4): qty 2

## 2025-01-30 MED ORDER — PREDNISONE 5 MG (21) PO TBPK: 5.0000 mg | ORAL_TABLET | ORAL | Status: AC

## 2025-01-30 MED ORDER — PREDNISONE 5 MG (21) PO TBPK
10.0000 mg | ORAL_TABLET | Freq: Every evening | ORAL | Status: AC
  Administered 2025-01-31: 10 mg via ORAL

## 2025-01-30 MED ORDER — PREDNISONE 5 MG (21) PO TBPK
10.0000 mg | ORAL_TABLET | Freq: Every morning | ORAL | Status: AC
  Administered 2025-01-30: 10 mg via ORAL
  Filled 2025-01-30: qty 21

## 2025-01-30 NOTE — Progress Notes (Signed)
 "   Referring Physician(s): Ole Bourdon, NP  Supervising Physician: Jennefer Rover  Patient Status:  [facility] - In-pt  Chief Complaint: Benign prostatic hypertrophy  Subjective: Lying in bed.  In a moderate amount of discomfort.  SCr improved after fluid overnight, now 1.3.  Foley catheter in place with blood-tinged urine in bag, but more yellow urine in Foley-- appears to be clearing.  Several questions answered at bedside with patient and wife.   Allergies: Patient has no known allergies.  Medications: Prior to Admission medications  Medication Sig Start Date End Date Taking? Authorizing Provider  amLODipine  (NORVASC ) 10 MG tablet TAKE 1 TABLET(10 MG) BY MOUTH DAILY Patient taking differently: Take 10 mg by mouth daily. 03/05/24  Yes Sagardia, Emil Schanz, MD  ELIQUIS  5 MG TABS tablet Take 5 mg by mouth 2 (two) times daily.   Yes [provider]  omeprazole  (PRILOSEC) 40 MG capsule Take 1 capsule (40 mg total) by mouth daily. Patient taking differently: Take 40 mg by mouth daily before breakfast. 01/21/25  Yes Heinz, Sara E, PA-C  rosuvastatin  (CRESTOR ) 10 MG tablet TAKE 1 TABLET(10 MG) BY MOUTH DAILY 08/29/24  Yes Sagardia, Emil Schanz, MD  tamsulosin  (FLOMAX ) 0.4 MG CAPS capsule Take 0.8 mg by mouth daily. 04/26/24  Yes [provider]  TYLENOL  500 MG tablet Take 500-1,000 mg by mouth every 6 (six) hours as needed (for pain).   Yes [provider]  cephALEXin  (KEFLEX ) 500 MG capsule Take 1 capsule (500 mg total) by mouth 4 (four) times daily. Patient not taking: Reported on 01/21/2025 12/14/24   Zammit, Joseph, MD  fluocinonide  cream (LIDEX ) 0.05 % APPLY TOPICALLY TO THE AFFECTED AREA TWICE DAILY Patient not taking: Reported on 01/21/2025 03/05/24   Purcell Emil Schanz, MD  fluticasone  (FLONASE ) 50 MCG/ACT nasal spray Use two sprays in each nostril once daily as directed. Patient not taking: Reported on 01/21/2025 01/10/24   Kozlow, Eric J, MD   HYDROcodone  bit-homatropine (HYCODAN) 5-1.5 MG/5ML syrup Take 5 mLs by mouth at bedtime as needed for cough. Patient not taking: No sig reported 05/29/24   Purcell Emil Schanz, MD  phenazopyridine  (PYRIDIUM ) 200 MG tablet Take 1 tablet (200 mg total) by mouth 3 (three) times daily as needed for pain (burining). Patient not taking: No sig reported 12/14/24   Suzette Pac, MD  Vitamin D , Ergocalciferol , (DRISDOL ) 1.25 MG (50000 UNIT) CAPS capsule Take 1 capsule (50,000 Units total) by mouth every 7 (seven) days. Patient not taking: No sig reported 05/02/24   Purcell Emil Schanz, MD     Vital Signs: BP (!) 126/93 (BP Location: Left Arm)   Pulse (!) 116   Temp 97.9 F (36.6 C)   Resp 18   Ht 5' 5 (1.651 m)   Wt 182 lb 15.7 oz (83 kg)   SpO2 100%   BMI 30.45 kg/m   Physical Exam Vitals and nursing note reviewed.   NAD, alert, pelvic pain as expected.  Skin: right radial access stable.  No bruising or swelling.  Pulses intact.   Imaging: CT ANGIO PELVIS W OR WO CONTRAST Result Date: 01/28/2025 CLINICAL DATA:  Road mapping study prior to prostate artery embolization EXAM: CT ANGIOGRAPHY PELVIS WITHOUT AND CONTRAST TECHNIQUE: Multidetector CT imaging of the pelvis was performed using the standard protocol before and following the bolus administration of intravenous contrast. Multiplanar image (3D post-processing) reconstructions and MIPs were obtained to evaluate the pelvic vascular anatomy. RADIATION DOSE REDUCTION: This exam was performed according to the departmental  dose-optimization program which includes automated exposure control, adjustment of the mA and/or kV according to patient size and/or use of iterative reconstruction technique. CONTRAST:  75mL OMNIPAQUE  IOHEXOL  350 MG/ML SOLN COMPARISON:  CT AP, 07/14/2024 and 12/10/2024. FINDINGS: VASCULAR; Pelvis: *Widely patent aortic bifurcation, hypogastric, external iliac and common femoral arteries. *Internal iliac artery branching with  division into the superior gluteal artery and gluteal pudendal trunk bilaterally (Yamaki group A). *Patent BILATERAL prostatic arteries, seemingly from the anterior division of the internal iliac artery/gluteal pudendal trunks bilaterally (type II). NONVASCULAR; Urinary Tract: Foley catheter in place. No distal ureteral or urinary bladder distention. Relative urinary bladder wall enhancement. No discrete intra vesicular contrast extravasation Bowel: Appendectomy. Imaged portions of distal small bowel, colon and rectum are nondilated. Lymphatic: No enlarged abdominal or pelvic lymph nodes. Reproductive: Prostatomegaly, measuring approximately 5.5 x 5.0 x 8.5 cm, with a calculated volume of 117 mL Other:  No pelvic ascites. Musculoskeletal: No suspicious bone lesion. IMPRESSION: 1. Prostatomegaly, measuring approximately 117 g in volume. 2. BILATERAL internal iliac artery branching into the superior gluteal artery and gluteal pudendal trunk (Yamaki group A) 3. Patent BILATERAL prostatic arteries, arising from the gluteal pudendal trunks bilaterally (type II). Electronically Signed   By: Thom Hall M.D.   On: 01/28/2025 16:37   DG Abd Portable 1V Result Date: 01/27/2025 CLINICAL DATA:  Abdominal pain, assess stool burden. EXAM: PORTABLE ABDOMEN - 1 VIEW COMPARISON:  01/21/2025. FINDINGS: No small bowel or colonic dilatation. Gas is seen in the colon with minimal scattered stool. Surgical clips in the right abdomen. Visualized lung bases are clear. IMPRESSION: No acute findings. Electronically Signed   By: Newell Eke M.D.   On: 01/27/2025 09:27    Labs:  CBC: Recent Labs    01/24/25 0435 01/25/25 0414 01/27/25 0350 01/30/25 0427  WBC 4.5 4.1 4.1 6.9  HGB 11.9* 11.8* 13.0 13.5  HCT 34.5* 34.9* 36.6* 38.9*  PLT 148* 173 215 228    COAGS: Recent Labs    01/28/25 0405  INR 1.1    BMP: Recent Labs    01/27/25 0350 01/28/25 0405 01/29/25 0422 01/30/25 0427  NA 140 138 141 138  K 3.4*  3.3* 3.4* 3.2*  CL 104 101 105 101  CO2 27 27 26 23   GLUCOSE 120* 109* 122* 122*  BUN 10 16 13 10   CALCIUM  8.9 8.8* 9.0 8.9  CREATININE 1.58* 1.74* 1.48* 1.30*  GFRNONAA 49* 44* 53* >60    LIVER FUNCTION TESTS: Recent Labs    05/02/24 0913 01/21/25 1159 01/21/25 1459  BILITOT 0.9 0.5 0.4  AST 25 24 30   ALT 29 28 33  ALKPHOS 74 56 63  PROT 7.3 7.6 7.2  ALBUMIN 4.4 4.5 4.4    Assessment and Plan: PAE Successful bilateral PAE with Dr. Hall yesterday. Scr improved overnight with fluid bolus, now 1.3 Patient with expected post-procedure discomfort and pain today.  Urology APP present during exam and planning to order steroid dose pack as well as antispasmodic.  All questions answered.  Procedure site intact.   Foley care per Urology.   No current needs in IR.  We remain available as needed.  Electronically Signed: Mar Zettler Sue-Ellen Aarron Wierzbicki, PA 01/30/2025, 1:37 PM   I spent a total of 15 Minutes at the the patient's bedside AND on the patient's hospital floor or unit, greater than 50% of which was counseling/coordinating care for benign prostatic hypertrophy.       "

## 2025-01-30 NOTE — Progress Notes (Signed)
 "    Subjective: Resting comfortable bed.  Some progress with laxative plus enema yesterday but reports he is still experiencing constipation and consistent bladder spasm.  Hematuria has nearly resolved.  Objective: Vital signs in last 24 hours: Temp:  [97.9 F (36.6 C)-98.3 F (36.8 C)] 97.9 F (36.6 C) (02/05 0416) Pulse Rate:  [57-90] 90 (02/05 0416) Resp:  [11-22] 17 (02/05 0416) BP: (98-140)/(71-97) 140/97 (02/05 0416) SpO2:  [94 %-100 %] 99 % (02/05 0416)  Assessment/Plan: # Severe BPH # CKD # Constipation-resolved # gross hematuria-resolved   Over 200g prostate with large intravesical intrusion.  Prostate cancer-XRT was not offered here and he pursued this at [facility]    Thankfully his serum creatinine continues to improve, down to 1.30, nearly at baseline this morning.  His sensation of constipation is largely pressure from his prostate on the rectum.  Reviewing his CT A/P from 1/27.  He does not have a large amount of colonic/pelvic stool burden.  This is largely resolved and he feels that he is having normal bowel movements.  Successful PA E with interventional radiology yesterday afternoon.  Patient is unfortunately experiencing a significant amount of pain localized to the prostate per his report.  Assuming these are ischemic changes following his embolization.  Titrated his as needed pain medication accordingly.   Patient to discharge with Foley catheter and consider voiding trial after catheter exchange at 30-day mark, on or about 02/21/2025.  He will be contacted from clinic to schedule follow-up appointment.    #Bladder spasm  Continue Myrbetriq  daily and as needed hyoscyamine . Hold Myrbetriq  7 to 10 days prior to voiding trial.   # PE   Chronic anticoagulation   Noted to be right side hydrogel material emboli on CT angio 09/19/2024.  Eliquis  on hold. Trial after PAE and clear of hematuria for 48hrs.   Consider alternative to trazodone .  This is our #1 cause of  drug-induced priapism, even in patients that have previously tolerated it well.  Intake/Output from previous day: 02/04 0701 - 02/05 0700 In: 840 [NG/GT:640; IV Piggyback:200] Out: 4805 [Urine:4805]  Intake/Output this shift: No intake/output data recorded.  Physical Exam:  General: Alert and oriented CV: No cyanosis Lungs: equal chest rise Gu: Foley catheter in place draining clear lightly blood-tinged urine  Lab Results: Recent Labs    01/30/25 0427  HGB 13.5  HCT 38.9*   BMET Recent Labs    01/29/25 0422 01/30/25 0427  NA 141 138  K 3.4* 3.2*  CL 105 101  CO2 26 23  GLUCOSE 122* 122*  BUN 13 10  CREATININE 1.48* 1.30*  CALCIUM  9.0 8.9  HGB  --  13.5  WBC  --  6.9     Studies/Results: CT ANGIO PELVIS W OR WO CONTRAST Result Date: 01/28/2025 CLINICAL DATA:  Road mapping study prior to prostate artery embolization EXAM: CT ANGIOGRAPHY PELVIS WITHOUT AND CONTRAST TECHNIQUE: Multidetector CT imaging of the pelvis was performed using the standard protocol before and following the bolus administration of intravenous contrast. Multiplanar image (3D post-processing) reconstructions and MIPs were obtained to evaluate the pelvic vascular anatomy. RADIATION DOSE REDUCTION: This exam was performed according to the departmental dose-optimization program which includes automated exposure control, adjustment of the mA and/or kV according to patient size and/or use of iterative reconstruction technique. CONTRAST:  75mL OMNIPAQUE  IOHEXOL  350 MG/ML SOLN COMPARISON:  CT AP, 07/14/2024 and 12/10/2024. FINDINGS: VASCULAR; Pelvis: *Widely patent aortic bifurcation, hypogastric, external iliac and common femoral arteries. *Internal iliac artery  branching with division into the superior gluteal artery and gluteal pudendal trunk bilaterally (Yamaki group A). *Patent BILATERAL prostatic arteries, seemingly from the anterior division of the internal iliac artery/gluteal pudendal trunks bilaterally  (type II). NONVASCULAR; Urinary Tract: Foley catheter in place. No distal ureteral or urinary bladder distention. Relative urinary bladder wall enhancement. No discrete intra vesicular contrast extravasation Bowel: Appendectomy. Imaged portions of distal small bowel, colon and rectum are nondilated. Lymphatic: No enlarged abdominal or pelvic lymph nodes. Reproductive: Prostatomegaly, measuring approximately 5.5 x 5.0 x 8.5 cm, with a calculated volume of 117 mL Other:  No pelvic ascites. Musculoskeletal: No suspicious bone lesion. IMPRESSION: 1. Prostatomegaly, measuring approximately 117 g in volume. 2. BILATERAL internal iliac artery branching into the superior gluteal artery and gluteal pudendal trunk (Yamaki group A) 3. Patent BILATERAL prostatic arteries, arising from the gluteal pudendal trunks bilaterally (type II). Electronically Signed   By: Thom Hall M.D.   On: 01/28/2025 16:37       LOS: 8 days   Ole Bourdon, NP [facility] Pager: [phone]  01/30/2025, 7:34 AM  "

## 2025-01-30 NOTE — Progress Notes (Signed)
 "        [facility], is a 64 y.o. male, DOB - 1961/06/27, FMW:[id number] Admit date - 01/21/2025    Outpatient Primary MD for the patient is Sagardia, Emil Schanz, MD  LOS - 8  days    Brief summary   64 year old man PMH including prostate cancer status postradiation seen at gastroenterology outpatient clinic, labs revealed AKI the patient was sent to the emergency department where urology advised placement of coud catheter. Admitted for AKI secondary to BPH/obstructive uropathy. Renal function slowly improved but did not return to baseline. Seen by nephrology with recommendation for outpatient follow-up. Had hematuria and therefore anticoagulation was stopped but would recommend resuming after procedure. Followed by urology, recommends keeping catheter in place, obtain pelvic arterial mapping and proceed with pelvic artery embolization prior to discharge.   Assessment & Plan    Assessment and Plan:  Gross hematuria s/p catheter placement. In the setting of prostatomegaly and moderate right and left hydroureteronephrosis Urology consulted recommended to continue with catheter on discharge patient underwent pelvic artery embolization by IR.  Hematuria re occured. Urology started him on decadron  taper.  Pain sub optimal, changed to norco    Acute kidney injury secondary to bladder outlet obstruction with secondary hydroureteronephrosis from severe BPH/prostate cancer. Creatinine continues to improve currently at 1.3 today   Hypokalemia Replaced., repeat in am.    Essential hypertension Blood pressure parameters, optimal.  Continue with norvasc  10 mg daily.   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  However whether this was hydrogel or organic is disputed in the  chart.  Discussed with urology.  Will hold apixaban  given bleeding. Suggest resuming anticoagulation after definitive management per urology and follow-up as an outpatient.  Type 2 diabetes mellitus Hemoglobin A1c November 2025 Diet controlled.  Constipation Resolved     Estimated body mass index is 30.45 kg/m as calculated from the following:   Height as of this encounter: 5' 5 (1.651 m).   Weight as of this encounter: 83 kg.  Code Status: full code DVT Prophylaxis:  Place and maintain sequential compression device Start: 01/23/25 1909   Level of Care: Level of care: Med-Surg Family Communication: family at bedside.   Disposition Plan:     Remains inpatient appropriate:  pending   Procedures:  None.   Consultants:   Urology IR   Antimicrobials:   Anti-infectives (From admission, onward)    Start     Dose/Rate Route Frequency Ordered Stop   01/29/25 1558  cefTRIAXone  (ROCEPHIN ) 2 g in sodium chloride  0.9 % 100 mL IVPB        over 30 Minutes Intravenous Continuous PRN 01/29/25 1606 01/29/25 1628   01/29/25 1300  cefTRIAXone  (ROCEPHIN ) 2 g in sodium chloride  0.9 % 100 mL IVPB        2 g 200 mL/hr over 30 Minutes Intravenous  Once 01/28/25 1655 01/29/25 1349  Medications  Scheduled Meds:  amLODipine   10 mg Oral Daily   Chlorhexidine  Gluconate Cloth  6 each Topical Daily   feeding supplement  237 mL Oral BID BM   insulin  aspart  0-15 Units Subcutaneous TID WC   insulin  aspart  0-5 Units Subcutaneous QHS   mirabegron  ER  25 mg Oral Daily   pantoprazole   40 mg Oral Daily   polyethylene glycol  17 g Oral BID   potassium chloride   40 mEq Oral BID   predniSONE   10 mg Oral Nightly   [START ON 01/31/2025] predniSONE   10 mg Oral Nightly   predniSONE   5 mg Oral PC supper   [START ON 01/31/2025] predniSONE   5 mg Oral 3 x daily with food   [START ON 02/01/2025] predniSONE   5 mg Oral 4X daily taper   rosuvastatin   10 mg Oral Daily   senna  2 tablet Oral Daily    tamsulosin   0.8 mg Oral QPC supper   Continuous Infusions:  sodium chloride      PRN Meds:.acetaminophen  **OR** acetaminophen , albuterol , bisacodyl , HYDROcodone -acetaminophen , HYDROmorphone  (DILAUDID ) injection, hyoscyamine , LORazepam , melatonin, ondansetron  **OR** ondansetron  (ZOFRAN ) IV, sodium chloride  flush    Subjective:   Race Latour was seen and examined today.  Pain not well controlled.   Objective:   Vitals:   01/29/25 2207 01/30/25 0416 01/30/25 1052 01/30/25 1411  BP: 131/86 (!) 140/97 (!) 126/93 121/78  Pulse: 72 90 (!) 116 81  Resp: 18 17 18 16   Temp: 98.3 F (36.8 C) 97.9 F (36.6 C)  99 F (37.2 C)  TempSrc:    Oral  SpO2: 99% 99% 100% 98%  Weight:      Height:        Intake/Output Summary (Last 24 hours) at 01/30/2025 1642 Last data filed at 01/30/2025 1200 Gross per 24 hour  Intake 640 ml  Output 3880 ml  Net -3240 ml   Filed Weights   01/21/25 1411  Weight: 83 kg     Exam General exam: Appears calm and comfortable  Respiratory system: Clear to auscultation. Respiratory effort normal. Cardiovascular system: S1 & S2 heard, RRR. No JVD,  Gastrointestinal system: Abdomen is nondistended, soft and nontender. Central nervous system: Alert and oriented.  Extremities: Symmetric 5 x 5 power. Skin: No rashes,  Psychiatry: Mood & affect appropriate.     Data Reviewed:  I have personally reviewed following labs and imaging studies   CBC Lab Results  Component Value Date   WBC 6.9 01/30/2025   RBC 4.50 01/30/2025   HGB 13.5 01/30/2025   HCT 38.9 (L) 01/30/2025   MCV 86.4 01/30/2025   MCH 30.0 01/30/2025   PLT 228 01/30/2025   MCHC 34.7 01/30/2025   RDW 12.0 01/30/2025   LYMPHSABS 0.5 (L) 01/30/2025   MONOABS 0.5 01/30/2025   EOSABS 0.0 01/30/2025   BASOSABS 0.0 01/30/2025     Last metabolic panel Lab Results  Component Value Date   NA 138 01/30/2025   K 3.2 (L) 01/30/2025   CL 101 01/30/2025   CO2 23 01/30/2025   BUN 10 01/30/2025    CREATININE 1.30 (H) 01/30/2025   GLUCOSE 122 (H) 01/30/2025   GFRNONAA >60 01/30/2025   GFRAA 91 11/12/2020   CALCIUM  8.9 01/30/2025   PHOS 2.4 (L) 06/02/2022   PROT 7.2 01/21/2025   ALBUMIN 4.4 01/21/2025   LABGLOB 2.3 06/05/2020   AGRATIO 1.9 06/05/2020   BILITOT 0.4 01/21/2025   ALKPHOS 63 01/21/2025   AST 30 01/21/2025  ALT 33 01/21/2025   ANIONGAP 15 01/30/2025    CBG (last 3)  Recent Labs    01/29/25 1936 01/30/25 0750 01/30/25 1149  GLUCAP 107* 115* 120*      Coagulation Profile: Recent Labs  Lab 01/28/25 0405  INR 1.1     Radiology Studies: No results found.      Elgie Butter M.D. [facility] 01/30/2025, 4:42 PM  Available via Epic secure chat 7am-7pm After 7 pm, please refer to night coverage provider listed on amion.    "

## 2025-01-31 LAB — BASIC METABOLIC PANEL WITH GFR
Anion gap: 12 (ref 5–15)
BUN: 15 mg/dL (ref 8–23)
CO2: 24 mmol/L (ref 22–32)
Calcium: 9.5 mg/dL (ref 8.9–10.3)
Chloride: 100 mmol/L (ref 98–111)
Creatinine, Ser: 1.38 mg/dL — ABNORMAL HIGH (ref 0.61–1.24)
GFR, Estimated: 57 mL/min — ABNORMAL LOW
Glucose, Bld: 166 mg/dL — ABNORMAL HIGH (ref 70–99)
Potassium: 4.6 mmol/L (ref 3.5–5.1)
Sodium: 136 mmol/L (ref 135–145)

## 2025-01-31 LAB — GLUCOSE, CAPILLARY
Glucose-Capillary: 132 mg/dL — ABNORMAL HIGH (ref 70–99)
Glucose-Capillary: 128 mg/dL — ABNORMAL HIGH (ref 70–99)
Glucose-Capillary: 203 mg/dL — ABNORMAL HIGH (ref 70–99)
Glucose-Capillary: 155 mg/dL — ABNORMAL HIGH (ref 70–99)

## 2025-01-31 MED ORDER — FLEET ENEMA RE ENEM: 1.0000 | ENEMA | Freq: Every day | RECTAL | Status: AC | PRN

## 2025-01-31 MED ORDER — BELLADONNA ALKALOIDS-OPIUM 16.2-60 MG RE SUPP: 1.0000 | Freq: Four times a day (QID) | RECTAL | Status: AC | PRN

## 2025-01-31 MED ORDER — ENOXAPARIN SODIUM 40 MG/0.4ML IJ SOSY
40.0000 mg | PREFILLED_SYRINGE | Freq: Every day | INTRAMUSCULAR | Status: AC
  Administered 2025-01-31: 40 mg via SUBCUTANEOUS
  Filled 2025-01-31: qty 0.4

## 2025-01-31 MED ORDER — ZOLPIDEM TARTRATE 5 MG PO TABS
5.0000 mg | ORAL_TABLET | Freq: Every evening | ORAL | Status: AC | PRN
  Administered 2025-01-31: 5 mg via ORAL
  Filled 2025-01-31: qty 1

## 2025-01-31 NOTE — Progress Notes (Signed)
 "        [facility], is a 64 y.o. male, DOB - [birth year], FMW:[id number] Admit date - 01/21/2025    Outpatient Primary MD for the patient is Sagardia, Emil Schanz, MD  LOS - 9  days    Brief summary   64 year old man PMH including prostate cancer status postradiation seen at gastroenterology outpatient clinic, labs revealed AKI the patient was sent to the emergency department where urology advised placement of coud catheter. Admitted for AKI secondary to BPH/obstructive uropathy. Renal function slowly improved but did not return to baseline. Seen by nephrology with recommendation for outpatient follow-up. Had hematuria and therefore anticoagulation was stopped but would recommend resuming after procedure. Followed by urology, recommends keeping catheter in place, obtain pelvic arterial mapping and proceed with pelvic artery embolization prior to discharge.   Assessment & Plan    Assessment and Plan:  Gross hematuria s/p catheter placement. In the setting of prostatomegaly and moderate right and left hydroureteronephrosis Urology consulted recommended to continue with catheter on discharge patient underwent pelvic artery embolization by IR. He had some hematuria post procedure, but its resolved now. But he continues to have pain and constipation.  Urology started him on decadron  taper.  Pain better controlled.    Acute kidney injury secondary to bladder outlet obstruction with secondary hydroureteronephrosis from severe BPH/prostate cancer. Creatinine continues to improve currently at 1.3 today   Hypokalemia and hypomagnesemia Replaced., repeat in am.    Essential hypertension Blood pressure parameters, optimal.  Continue with norvasc  10 mg daily.   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  However whether this was hydrogel or organic is disputed in the chart.  Discussed with urology.  Will hold apixaban  given bleeding. Suggest resuming anticoagulation after definitive management per urology and follow-up as an outpatient.  Type 2 diabetes mellitus Hemoglobin A1c November 2025 is 7.2% Diet controlled.  Constipation Resolved     Estimated body mass index is 30.45 kg/m as calculated from the following:   Height as of this encounter: 5' 5 (1.651 m).   Weight as of this encounter: 83 kg.  Code Status: full code DVT Prophylaxis:  enoxaparin  (LOVENOX ) injection 40 mg Start: 01/31/25 2200 Place and maintain sequential compression device Start: 01/23/25 1909   Level of Care: Level of care: Med-Surg Family Communication: family at bedside.   Disposition Plan:     Remains inpatient appropriate:  pending   Procedures:  None.   Consultants:   Urology IR   Antimicrobials:   Anti-infectives (From admission, onward)    Start     Dose/Rate Route Frequency Ordered Stop   01/29/25 1558  cefTRIAXone  (ROCEPHIN ) 2 g in sodium chloride  0.9 % 100 mL IVPB        over 30 Minutes Intravenous Continuous PRN 01/29/25 1606 01/29/25 1628   01/29/25 1300  cefTRIAXone  (ROCEPHIN ) 2 g in sodium chloride  0.9 % 100 mL IVPB  2 g 200 mL/hr over 30 Minutes Intravenous  Once 01/28/25 1655 01/29/25 1349        Medications  Scheduled Meds:  amLODipine   10 mg Oral Daily   Chlorhexidine  Gluconate Cloth  6 each Topical Daily   enoxaparin  (LOVENOX ) injection  40 mg Subcutaneous QHS   feeding supplement  237 mL Oral BID BM   insulin  aspart  0-15 Units Subcutaneous TID WC   insulin  aspart  0-5 Units Subcutaneous QHS   mirabegron  ER  25 mg Oral Daily   pantoprazole   40 mg Oral Daily   polyethylene glycol  17 g Oral BID   potassium chloride   40 mEq Oral BID   predniSONE   10 mg Oral Nightly   predniSONE   5 mg Oral PC supper   predniSONE   5 mg Oral 3  x daily with food   [START ON 02/01/2025] predniSONE   5 mg Oral 4X daily taper   rosuvastatin   10 mg Oral Daily   senna  2 tablet Oral Daily   tamsulosin   0.8 mg Oral QPC supper   Continuous Infusions:  sodium chloride      PRN Meds:.acetaminophen  **OR** acetaminophen , albuterol , opium -belladonna, bisacodyl , HYDROcodone -acetaminophen , HYDROmorphone  (DILAUDID ) injection, LORazepam , ondansetron  **OR** ondansetron  (ZOFRAN ) IV, sodium chloride  flush, sodium phosphate , zolpidem     Subjective:   Rickey Cruz was seen and examined today.  Pain better controlled.  Requesting medication for sleep.   Objective:   Vitals:   01/30/25 1411 01/30/25 2021 01/31/25 0541 01/31/25 1428  BP: 121/78 116/83 124/81 115/86  Pulse: 81 74 78 77  Resp: 16 18 18 14   Temp: 99 F (37.2 C) 98.3 F (36.8 C) 98.8 F (37.1 C) 97.8 F (36.6 C)  TempSrc: Oral Oral Oral Oral  SpO2: 98% 98% 99% 99%  Weight:      Height:        Intake/Output Summary (Last 24 hours) at 01/31/2025 1615 Last data filed at 01/31/2025 1431 Gross per 24 hour  Intake 1080 ml  Output 1700 ml  Net -620 ml   Filed Weights   01/21/25 1411  Weight: 83 kg     Exam General exam: Appears calm and comfortable  Respiratory system: Clear to auscultation. Respiratory effort normal. Cardiovascular system: S1 & S2 heard, RRR. Gastrointestinal system: Abdomen is nondistended, soft and nontender. Central nervous system: Alert and oriented.  Extremities: Symmetric 5 x 5 power. Skin: No rashes,     Data Reviewed:  I have personally reviewed following labs and imaging studies   CBC Lab Results  Component Value Date   WBC 6.9 01/30/2025   RBC 4.50 01/30/2025   HGB 13.5 01/30/2025   HCT 38.9 (L) 01/30/2025   MCV 86.4 01/30/2025   MCH 30.0 01/30/2025   PLT 228 01/30/2025   MCHC 34.7 01/30/2025   RDW 12.0 01/30/2025   LYMPHSABS 0.5 (L) 01/30/2025   MONOABS 0.5 01/30/2025   EOSABS 0.0 01/30/2025   BASOSABS 0.0 01/30/2025      Last metabolic panel Lab Results  Component Value Date   NA 136 01/31/2025   K 4.6 01/31/2025   CL 100 01/31/2025   CO2 24 01/31/2025   BUN 15 01/31/2025   CREATININE 1.38 (H) 01/31/2025   GLUCOSE 166 (H) 01/31/2025   GFRNONAA 57 (L) 01/31/2025   GFRAA 91 11/12/2020   CALCIUM  9.5 01/31/2025   PHOS 2.4 (L) 06/02/2022   PROT 7.2 01/21/2025   ALBUMIN 4.4 01/21/2025   LABGLOB 2.3 06/05/2020   AGRATIO 1.9 06/05/2020   BILITOT  0.4 01/21/2025   ALKPHOS 63 01/21/2025   AST 30 01/21/2025   ALT 33 01/21/2025   ANIONGAP 12 01/31/2025    CBG (last 3)  Recent Labs    01/30/25 2019 01/31/25 0752 01/31/25 1103  GLUCAP 202* 132* 128*      Coagulation Profile: Recent Labs  Lab 01/28/25 0405  INR 1.1     Radiology Studies: No results found.      Elgie Butter M.D. [facility] 01/31/2025, 4:15 PM  Available via Epic secure chat 7am-7pm After 7 pm, please refer to night coverage provider listed on amion.    "

## 2025-01-31 NOTE — Progress Notes (Signed)
" ° °  °  Subjective: Resting comfortable bed.  Some progress with laxative plus enema yesterday but reports he is still experiencing constipation and consistent bladder spasm.  Hematuria has nearly resolved.  Objective: Vital signs in last 24 hours: Temp:  [98.3 F (36.8 C)-99 F (37.2 C)] 98.8 F (37.1 C) (02/06 0541) Pulse Rate:  [74-81] 78 (02/06 0541) Resp:  [16-18] 18 (02/06 0541) BP: (116-124)/(78-83) 124/81 (02/06 0541) SpO2:  [98 %-99 %] 99 % (02/06 0541)  Assessment/Plan: # Severe BPH # CKD # Constipation-resolved # gross hematuria-resolved   Over 200g prostate with large intravesical intrusion.  Prostate cancer-XRT was not offered here and he pursued this at [facility].    Trend labs.  Serum creatinine 1.38  His sensation of constipation is largely pressure from his prostate on the rectum.  Reviewing his CT A/P from 1/27.  He does not have a large amount of colonic/pelvic stool burden.  This is largely resolved and he feels that he is having normal bowel movements.  Successful PAE with interventional radiology yesterday afternoon.  Started steroid Dosepak last night.  Severe pain ongoing throughout the day.  Uptitrated his medications which were then down titrated again.  Continues to complain of 9 out of 10 pain following maximum administration.  Primary team contacted to assess.  Presumably this is secondary to ischemic effects on the prostate following his embolization and should improve with time.   Patient to discharge with Foley catheter and consider voiding trial after catheter exchange at 30-day mark, on or about 02/21/2025.  He will be contacted from clinic to schedule follow-up appointment.  Clear yellow urine on rounds    #Bladder spasm  Continue Myrbetriq  daily.  Holding hyoscyamine  in favor of B&O suppositories.  Worsening spasms since embolization.   # PE   Chronic anticoagulation   Noted to be right side hydrogel material emboli on CT angio 09/19/2024.  Eliquis   on hold. Trial after PAE and clear of hematuria for 48hrs.   Consider alternative to trazodone .  This is our #1 cause of drug-induced priapism, even in patients that have previously tolerated it well.  Intake/Output from previous day: 02/05 0701 - 02/06 0700 In: 360 [P.O.:360] Out: 2300 [Urine:2300]  Intake/Output this shift: No intake/output data recorded.  Physical Exam:  General: Alert and oriented CV: No cyanosis Lungs: equal chest rise Gu: Foley catheter in place draining clear lightly blood-tinged urine  Lab Results: Recent Labs    01/30/25 0427  HGB 13.5  HCT 38.9*   BMET Recent Labs    01/30/25 0427 01/31/25 0418  NA 138 136  K 3.2* 4.6  CL 101 100  CO2 23 24  GLUCOSE 122* 166*  BUN 10 15  CREATININE 1.30* 1.38*  CALCIUM  8.9 9.5  HGB 13.5  --   WBC 6.9  --      Studies/Results: No results found.      LOS: 9 days   Rickey Bourdon, NP [facility] Pager: [phone]  01/31/2025, 1:20 PM  "

## 2025-02-04 ENCOUNTER — Ambulatory Visit: Admitting: Emergency Medicine

## 2025-02-27 ENCOUNTER — Ambulatory Visit: Admitting: Emergency Medicine

## 2025-02-27 ENCOUNTER — Ambulatory Visit: Admitting: Gastroenterology
